# Patient Record
Sex: Male | Born: 1940 | Race: White | Hispanic: No | Marital: Married | Smoking: Former smoker
Health system: Southern US, Community
[De-identification: ages and names within clinical notes are randomized; demographics above are authoritative.]

## PROBLEM LIST (undated history)

## (undated) DIAGNOSIS — I714 Abdominal aortic aneurysm, without rupture, unspecified: Secondary | ICD-10-CM

## (undated) DIAGNOSIS — J449 Chronic obstructive pulmonary disease, unspecified: Secondary | ICD-10-CM

## (undated) DIAGNOSIS — R918 Other nonspecific abnormal finding of lung field: Secondary | ICD-10-CM

## (undated) DIAGNOSIS — I70209 Unspecified atherosclerosis of native arteries of extremities, unspecified extremity: Secondary | ICD-10-CM

## (undated) DIAGNOSIS — M1990 Unspecified osteoarthritis, unspecified site: Secondary | ICD-10-CM

## (undated) DIAGNOSIS — C679 Malignant neoplasm of bladder, unspecified: Secondary | ICD-10-CM

## (undated) DIAGNOSIS — C3490 Malignant neoplasm of unspecified part of unspecified bronchus or lung: Secondary | ICD-10-CM

## (undated) DIAGNOSIS — I10 Essential (primary) hypertension: Secondary | ICD-10-CM

## (undated) DIAGNOSIS — I6523 Occlusion and stenosis of bilateral carotid arteries: Secondary | ICD-10-CM

## (undated) DIAGNOSIS — I350 Nonrheumatic aortic (valve) stenosis: Secondary | ICD-10-CM

## (undated) DIAGNOSIS — E559 Vitamin D deficiency, unspecified: Secondary | ICD-10-CM

## (undated) DIAGNOSIS — E785 Hyperlipidemia, unspecified: Secondary | ICD-10-CM

## (undated) DIAGNOSIS — I4819 Other persistent atrial fibrillation: Secondary | ICD-10-CM

## (undated) DIAGNOSIS — E039 Hypothyroidism, unspecified: Secondary | ICD-10-CM

## (undated) DIAGNOSIS — C4490 Unspecified malignant neoplasm of skin, unspecified: Secondary | ICD-10-CM

## (undated) HISTORY — PX: BACK SURGERY: SHX140

## (undated) HISTORY — DX: Unspecified osteoarthritis, unspecified site: M19.90

## (undated) HISTORY — DX: Malignant neoplasm of bladder, unspecified: C67.9

## (undated) HISTORY — DX: Abdominal aortic aneurysm, without rupture: I71.4

## (undated) HISTORY — PX: SKIN CANCER EXCISION: SHX779

## (undated) HISTORY — DX: Abdominal aortic aneurysm, without rupture, unspecified: I71.40

## (undated) HISTORY — PX: HYDROCELE EXCISION / REPAIR: SUR1145

## (undated) HISTORY — DX: Occlusion and stenosis of bilateral carotid arteries: I65.23

## (undated) HISTORY — DX: Unspecified atherosclerosis of native arteries of extremities, unspecified extremity: I70.209

## (undated) HISTORY — DX: Vitamin D deficiency, unspecified: E55.9

## (undated) HISTORY — PX: SPINE SURGERY: SHX786

## (undated) HISTORY — DX: Unspecified malignant neoplasm of skin, unspecified: C44.90

## (undated) HISTORY — DX: Essential (primary) hypertension: I10

## (undated) HISTORY — DX: Chronic obstructive pulmonary disease, unspecified: J44.9

## (undated) HISTORY — DX: Hyperlipidemia, unspecified: E78.5

---

## 1997-10-10 ENCOUNTER — Inpatient Hospital Stay (HOSPITAL_COMMUNITY): Admission: RE | Admit: 1997-10-10 | Discharge: 1997-10-12 | Payer: Self-pay | Admitting: Neurosurgery

## 1999-08-21 ENCOUNTER — Encounter: Admission: RE | Admit: 1999-08-21 | Discharge: 1999-08-21 | Payer: Self-pay | Admitting: Neurosurgery

## 1999-08-21 ENCOUNTER — Encounter: Payer: Self-pay | Admitting: Neurosurgery

## 2004-12-19 ENCOUNTER — Ambulatory Visit: Payer: Self-pay | Admitting: Cardiology

## 2005-06-27 ENCOUNTER — Encounter: Admission: RE | Admit: 2005-06-27 | Discharge: 2005-06-27 | Payer: Self-pay | Admitting: Surgery

## 2007-10-15 ENCOUNTER — Ambulatory Visit: Payer: Self-pay | Admitting: Cardiology

## 2008-10-02 ENCOUNTER — Ambulatory Visit: Payer: Self-pay | Admitting: Surgery

## 2008-10-16 ENCOUNTER — Encounter: Admission: RE | Admit: 2008-10-16 | Discharge: 2008-10-16 | Payer: Self-pay | Admitting: Surgery

## 2008-10-16 ENCOUNTER — Ambulatory Visit: Payer: Self-pay | Admitting: Surgery

## 2009-03-26 ENCOUNTER — Ambulatory Visit: Payer: Self-pay | Admitting: Surgery

## 2009-04-09 ENCOUNTER — Encounter: Admission: RE | Admit: 2009-04-09 | Discharge: 2009-04-09 | Payer: Self-pay | Admitting: Surgery

## 2009-04-09 ENCOUNTER — Ambulatory Visit: Payer: Self-pay | Admitting: Surgery

## 2009-07-16 ENCOUNTER — Encounter: Admission: RE | Admit: 2009-07-16 | Discharge: 2009-07-16 | Payer: Self-pay | Admitting: Surgery

## 2009-07-16 ENCOUNTER — Ambulatory Visit: Payer: Self-pay | Admitting: Surgery

## 2009-08-09 ENCOUNTER — Ambulatory Visit: Payer: Self-pay | Admitting: Surgery

## 2009-08-09 ENCOUNTER — Inpatient Hospital Stay (HOSPITAL_COMMUNITY): Admission: RE | Admit: 2009-08-09 | Discharge: 2009-08-10 | Payer: Self-pay | Admitting: Surgery

## 2009-08-09 HISTORY — PX: ABDOMINAL AORTIC ANEURYSM REPAIR: SUR1152

## 2009-09-03 ENCOUNTER — Encounter: Admission: RE | Admit: 2009-09-03 | Discharge: 2009-09-03 | Payer: Self-pay | Admitting: Surgery

## 2009-09-03 ENCOUNTER — Ambulatory Visit: Payer: Self-pay | Admitting: Surgery

## 2009-12-17 ENCOUNTER — Ambulatory Visit: Payer: Self-pay | Admitting: Surgery

## 2010-06-03 ENCOUNTER — Ambulatory Visit: Payer: Self-pay | Admitting: Surgery

## 2010-09-12 ENCOUNTER — Other Ambulatory Visit: Payer: Self-pay | Admitting: Surgery

## 2010-09-12 DIAGNOSIS — I714 Abdominal aortic aneurysm, without rupture, unspecified: Secondary | ICD-10-CM

## 2010-09-12 LAB — COMPREHENSIVE METABOLIC PANEL
ALT: 60 U/L — ABNORMAL HIGH (ref 0–53)
ALT: 70 U/L — ABNORMAL HIGH (ref 0–53)
AST: 46 U/L — ABNORMAL HIGH (ref 0–37)
AST: 46 U/L — ABNORMAL HIGH (ref 0–37)
Albumin: 4.4 g/dL (ref 3.5–5.2)
Alkaline Phosphatase: 68 U/L (ref 39–117)
BUN: 14 mg/dL (ref 6–23)
CO2: 24 mEq/L (ref 19–32)
Calcium: 8.7 mg/dL (ref 8.4–10.5)
Chloride: 107 mEq/L (ref 96–112)
Creatinine, Ser: 0.85 mg/dL (ref 0.4–1.5)
GFR calc Af Amer: >60 mL/min (ref >60)
GFR calc non Af Amer: >60 mL/min (ref >60)
Potassium: 3.8 mEq/L (ref 3.5–5.1)
Potassium: 4.5 mEq/L (ref 3.5–5.1)
Sodium: 134 mEq/L — ABNORMAL LOW (ref 135–145)
Sodium: 139 mEq/L (ref 135–145)
Total Bilirubin: 0.9 mg/dL (ref 0.3–1.2)
Total Protein: 6.1 g/dL (ref 6.0–8.3)

## 2010-09-12 LAB — BLOOD GAS, ARTERIAL
Acid-base deficit: 0.1 mmol/L (ref 0.0–2.0)
Acid-base deficit: 0.4 mmol/L (ref 0.0–2.0)
Drawn by: 24485
O2 Content: 4 L/min
O2 Saturation: 95.4 %
Patient temperature: 98.6
Patient temperature: 98.6
TCO2: 25.9 mmol/L (ref 0–100)
pCO2 arterial: 40.1 mmHg (ref 35.0–45.0)
pH, Arterial: 7.396 (ref 7.350–7.450)

## 2010-09-12 LAB — BASIC METABOLIC PANEL
CO2: 25 mEq/L (ref 19–32)
Calcium: 8.7 mg/dL (ref 8.4–10.5)
Chloride: 106 mEq/L (ref 96–112)
GFR calc Af Amer: >60 mL/min (ref >60)
GFR calc non Af Amer: >60 mL/min (ref >60)
Glucose, Bld: 138 mg/dL — ABNORMAL HIGH (ref 70–99)

## 2010-09-12 LAB — CBC
HCT: 36.4 % — ABNORMAL LOW (ref 39.0–52.0)
Hemoglobin: 11.9 g/dL — ABNORMAL LOW (ref 13.0–17.0)
Hemoglobin: 15 g/dL (ref 13.0–17.0)
MCHC: 35 g/dL (ref 30.0–36.0)
MCHC: 35.1 g/dL (ref 30.0–36.0)
MCV: 92.7 fL (ref 78.0–100.0)
Platelets: 238 10*3/uL (ref 150–400)
RBC: 3.65 MIL/uL — ABNORMAL LOW (ref 4.22–5.81)
RDW: 14 % (ref 11.5–15.5)
WBC: 13.2 10*3/uL — ABNORMAL HIGH (ref 4.0–10.5)
WBC: 9.8 10*3/uL (ref 4.0–10.5)

## 2010-09-12 LAB — URINALYSIS, ROUTINE W REFLEX MICROSCOPIC
Hgb urine dipstick: NEGATIVE
Nitrite: NEGATIVE
Protein, ur: NEGATIVE mg/dL
Specific Gravity, Urine: 1.018 (ref 1.005–1.030)

## 2010-09-12 LAB — PROTIME-INR
INR: 1.19 (ref 0.00–1.49)
Prothrombin Time: 13.8 seconds (ref 11.6–15.2)
Prothrombin Time: 15 seconds (ref 11.6–15.2)

## 2010-09-12 LAB — TYPE AND SCREEN: Antibody Screen: NEGATIVE

## 2010-09-12 LAB — ABO/RH: ABO/RH(D): A POS

## 2010-09-30 ENCOUNTER — Ambulatory Visit
Admission: RE | Admit: 2010-09-30 | Discharge: 2010-09-30 | Disposition: A | Discharge status: Home / Self Care | Payer: Medicare Other | Source: Ambulatory Visit | Attending: Surgery | Admitting: Surgery

## 2010-09-30 ENCOUNTER — Ambulatory Visit (INDEPENDENT_AMBULATORY_CARE_PROVIDER_SITE_OTHER): Payer: Medicare Other | Admitting: Surgery

## 2010-09-30 DIAGNOSIS — I714 Abdominal aortic aneurysm, without rupture: Secondary | ICD-10-CM

## 2010-09-30 MED ORDER — IOHEXOL 350 MG/ML SOLN
100.0000 mL | Freq: Once | INTRAVENOUS | Status: AC | PRN
  Administered 2010-09-30: 100 mL via INTRAVENOUS

## 2010-10-01 NOTE — Assessment & Plan Note (Signed)
OFFICE VISIT  LEWIS, KEATS DOB:  02/16/1941                                       09/30/2010 EAVWU#:[id number]  REASON FOR VISIT:  Followup aneurysm.  HISTORY:  This is a 70 year old gentleman who is status post percutaneous endovascular aneurysm repair on 08/09/2009 using a Gore excluder device.  He had an uncomplicated postoperative course and comes back in today for followup.  He had a CT scan today.  He denies symptoms of abdominal pain.  He denies neurologic symptoms.  PHYSICAL EXAMINATION:  Vital signs:  Heart rate 71, blood pressure 134/74, respiratory rate 20.  General:  He is well-appearing, in no distress.  Abdomen:  Soft, nontender.  Palpable femoral pulses. Neurological:  He is intact.  DIAGNOSTIC STUDIES:  I reviewed his CT scan.  His aneurysm sac has decreased to 4.2 x 3.4, the previous CT measurements were 5.1 x 4.6.  ASSESSMENT AND PLAN: 1. Status post endovascular aneurysm repair.  His aneurysm is     shrinking nicely.  There was no evidence of endoleak on CT scan.     He will come back for an ultrasound in 6 months.  I believe we can     follow him from here on out with ultrasound. 2. Carotid disease.  The patient is scheduled for a carotid ultrasound     in June as a 1 year followup.  His most recent ultrasound showed     40%-59% stenosis on the right.  I am going to try to coordinate     this so that he gets his carotid and his abdominal ultrasound on     the same day which will be 6 months from now.    Jorge Ny, MD Electronically Signed  VWB/MEDQ  D:  09/30/2010  T:  10/01/2010  Job:  [id number]  cc:   Caralyn Guile. Ethelene Hal, M.D. Dr Lindaann Pascal

## 2010-11-05 NOTE — Procedures (Signed)
VASCULAR LAB EXAM   INDICATION:  Followup of AAA endograft placed 08/09/2009.   HISTORY:  Diabetes:  No.  Cardiac:  No.  Hypertension:  Yes.   EXAM:  AAA sac size 4.5 cm AP, 4.25 cm transverse.  Previous sac size 4.5 cm AP, 5.1 cm transverse.   IMPRESSION:  1. The aorta and endograft appear patent.  2. No significant change in size of the aneurysmal sac surrounding the      endograft.  3. No evidence of endo leak was detected.   ___________________________________________  V. Charlena Cross, MD   EM/MEDQ  D:  06/04/2010  T:  06/04/2010  Job:  [id number]

## 2010-11-05 NOTE — Assessment & Plan Note (Signed)
OFFICE VISIT   [name] [name], [name] B.  DOB:  1941/03/05                                       10/16/2008  [id number]#:[id number]   REASON FOR VISIT:  Followup.   HISTORY:  This is a 70 year old gentleman who initially presented with  back pain.  His workup revealed a large abdominal aneurysm.  He is sent  to me for further evaluation.  I had sent him for CT scan as well as  Doppler studies.  He comes back to review these studies.  He has had no  change in his symptoms.  He does not have any back pain.   PHYSICAL EXAMINATION:  VITAL SIGNS:  Blood pressure 148/78, pulse 76,  respirations 18.  GENERAL:  He is well-appearing in no distress.  ABDOMEN:  Soft, nontender.  EXTREMITIES:  Warm and well perfused.   DIAGNOSTIC STUDIES:  CT angiogram reveals a 4.6 cm maximal diameter  infrarenal abdominal aortic aneurysm with a 26 mm infrarenal aortic  neck.   Lower extremity Doppler evaluations reveal ankle brachial index of 1.0  on the right and 0.95 on the left.   Carotid ultrasound reveals 40-59% right carotid stenosis and 20-39% left  carotid stenosis.   ASSESSMENT:  Abdominal aortic aneurysm.   PLAN:  The patient's infrarenal abdominal aortic aneurysm is not yet of  the size that I would recommend repair.  We will need to follow this on  a biannual  basis.  I will plan on seeing him back in 6 months with an  abdominal ultrasound.  We will base our decision to repair this on its  size as well as the rate it is growing.  I told the patient that he has  very low risk for rupture.  However, should he have severe abdominal  pain, he would need to go to the Emergency Department to have this  evaluated.   Jorge Ny, MD  Electronically Signed   VWB/MEDQ  D:  10/16/2008  T:  10/17/2008  Job:  [id number]   cc:   Caralyn Guile. Ethelene Hal, M.D.  Scott Dr. Jacqulyn Bath

## 2010-11-05 NOTE — Assessment & Plan Note (Signed)
OFFICE VISIT   SHAWNEE, HIGHAM  DOB:  Apr 10, 1941                                       04/09/2009  EAVWU#:[id number]   REASON FOR VISIT:  Abdominal aortic aneurysm.   HISTORY:  This is a 70 year old gentleman, who is being followed for  asymptomatic abdominal aortic aneurysm.  He is most recently seen in the  office and found to have increase in size of his aneurysm by ultrasound.  He was sent for CT scan and comes back today for evaluation.   The patient denies having any abdominal pain.  His hypertension,  hypercholesterolemia, both well controlled with medications.   He denies chest pain or shortness of breath.   PHYSICAL EXAMINATION:  Blood pressure 151/77, pulse is 79.  He is well-  appearing in no distress.  Cardiovascular:  Regular rate and rhythm.  Normocephalic, atraumatic.  Respirations nonlabored.  Abdomen is soft,  nontender.  Extremities are warm and well perfused.   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   ASSESSMENT:  Infrarenal abdominal aortic aneurysm, asymptomatic.   PLAN:  The patient does not yet meet size criteria for repair.  I am  going to have him come back to see me in 3 months.  At that time he will  get a CT angiogram.  If he has increased to greater than 5 cm we would  discuss proceeding with repair.  He mentioned that he is interested in  open operation, however, he does not know much about the endovascular  repair.  Therefore we will discuss both methods when he comes back.   Jorge Ny, MD  Electronically Signed   VWB/MEDQ  D:  04/09/2009  T:  04/10/2009  Job:  [id number]   cc:   Dr. Rennis Harding D. Ethelene Hal, M.D.

## 2010-11-05 NOTE — Assessment & Plan Note (Signed)
OFFICE VISIT   DASHTON, CZERWINSKI  DOB:  1941/05/19                                       07/16/2009  LOVFI#:[id number]   REASON FOR VISIT:  Abdominal aneurysm.   HISTORY:  This is a 68-year gentleman that I have been following for  abdominal aortic aneurysm who comes back in today for review of his most  recent CT scan.  He denies having any abdominal pain.  The patient  suffers from hypertension and hypercholesterolemia, both which are  medically managed.   PAST MEDICAL HISTORY:  Hypertension and hypercholesterolemia.   PAST SURGICAL HISTORY:  Back surgery x2.   FAMILY HISTORY:  Positive for early atherosclerotic disease, diabetes,  hypertension and malignancy.   SOCIAL HISTORY:  He has a history of tobacco but quit in 2007.  Does not  drink.   PHYSICAL EXAMINATION:  Heart rate 83, blood pressure 147/80,  respirations 16.  General:  Well-appearing, in no distress.  HEENT is  normal.  LUNGS:  Clear bilaterally.  Cardiovascular:  Regular rate and  rhythm.  Abdomen:  Soft, nontender.  No hepatosplenomegaly.  Musculoskeletal:  Without major deformities.  Neurology:  Without focal  deficits.  Skin is without rash.   DIAGNOSTIC STUDIES:  CT angiogram was done today.  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   Most recent carotid ultrasound was in June of 2009.  This revealed 40%  to  59% right stenosis, 20% to 39% left.  Ankle brachial index is 1.0 on  the right and 0.95 in the posterior tibial artery on the left.   ASSESSMENT/PLAN:  Infrarenal abdominal aortic aneurysm.   Plan - I  discussed continued observation versus endovascular treatment.  We have decided to proceed with endovascular repair.  We discussed the  significance of the dissection in the left common iliac artery.  I do  not think this will be a problem.  He is scheduled to undergo repair on  Thursday,  February 17.  I am sitting in for cardiac Myoview for  preoperative clearance.  Risks and benefits were discussed with the  patient.     Jorge Ny, MD  Electronically Signed   VWB/MEDQ  D:  07/16/2009  T:  07/17/2009  Job:  [id number]   cc:   Caralyn Guile. Ethelene Hal, M.D.  Scott Dr. Jacqulyn Bath

## 2010-11-05 NOTE — Procedures (Signed)
CAROTID DUPLEX EXAM   INDICATION:  Known carotid disease.   HISTORY:  Diabetes:  No.  Cardiac:  No.  Hypertension:  Yes.  Smoking:  Quit in 2003.  Previous Surgery:  AAA repair on 08/09/09, asymptomatic.  CV History:  Amaurosis Fugax No, Paresthesias No, Hemiparesis No.                                       RIGHT             LEFT  Brachial systolic pressure:         141               148  Brachial Doppler waveforms:         Normal            Normal  Vertebral direction of flow:        Antegrade         Antegrade  DUPLEX VELOCITIES (cm/sec)  CCA peak systolic                   75                79  ECA peak systolic                   87                99  ICA peak systolic                   119               93  ICA end diastolic                   35                38  PLAQUE MORPHOLOGY:                  Heterogenous      Heterogenous  PLAQUE AMOUNT:                      Moderate          Mild  PLAQUE LOCATION:                    Bifurcation/ICA   Bifurcation/ICA   IMPRESSION:  1. The Doppler velocities suggest 40% to 59% stenosis in the right      internal carotid artery.  2. Doppler velocities suggest 1% to 39% stenosis in the left internal      carotid artery.  3. Antegrade flow in the bilateral vertebral arteries.   ___________________________________________  V. Charlena Cross, MD   NT/MEDQ  D:  12/17/2009  T:  12/17/2009  Job:  [id number]

## 2010-11-05 NOTE — Assessment & Plan Note (Signed)
OFFICE VISIT   [name], [name]  DOB:  [birth year]                                       09/03/2009  ZOXWR#:[id number]   REASON FOR VISIT:  Follow up aneurysm.   Patient comes back in today.  He is status post endovascular aneurysm  repair on 08/09/2009.  This was done with a Gore excluder device using a  percutaneous approach.  Again, his postoperative course is  uncomplicated.  He comes back today feeling and saying he has had no  trouble whatsoever at home.   He has a CT scan today which shows no evidence of endoleak, as the graft  is in good position.   On examination, his abdomen is soft.  He has palpable pedal pulses.  His  incisions in his groin are stable.   The patient will come back to see me in 3 months with an abdominal  ultrasound.  At that time, I will also obtain his 1-year follow-up  carotid surveillance duplex.     Jorge Ny, MD  Electronically Signed   VWB/MEDQ  D:  09/03/2009  T:  09/04/2009  Job:  [id number]   cc:   Caralyn Guile. Ethelene Hal, M.D.  Dr. Lindaann Pascal

## 2010-11-05 NOTE — Procedures (Signed)
ENDOVASCULAR STENT GRAFT EXAM   INDICATION:  Follow up AAA repair.   HISTORY:  08/09/2009, AAA stent graft repair.                           DUPLEX EVALUATION   AAA Sac Size:                 4.5 CM AP             5.1 CM TRV  Previous Sac Size:            4.6 by CT on 09/03/09 CM AP  5.1 by CT on 09/03/09 CM TRV  Evidence of an endoleak?      No                    No   Velocity Criteria:  Proximal Aorta                75 cm/sec  Proximal Stent Graft          70 cm/sec  Main Body Stent Graft-Mid     49 cm/sec  Right Limb-Proximal           46 cm/sec  Right Limb-Distal             131 cm/sec  Left Limb-Proximal            55 cm/sec  Left Limb-Distal              97 cm/sec  Patent Renal Arteries?        Yes                   Yes   IMPRESSION:  Patent abdominal aortic aneurysm stent graft repair with no  evidence of an endoleak.  Stable sac size from previous CT on 09/03/09.   ___________________________________________  V. Charlena Cross, MD   NT/MEDQ  D:  12/17/2009  T:  12/17/2009  Job:  [id number]

## 2010-11-05 NOTE — Assessment & Plan Note (Signed)
OFFICE VISIT   [name] [name], [name] [name]  DOB:  24-Dec-1940                                       06/03/2010  UEAVW#:[id number]   The patient comes back today for followup of his aneurysm repair which  was done on 08/09/2009.  This was done percutaneously using a [employer] device.  He had an uncomplicated postoperative course.  He  comes in today for ultrasound.  He has no complaints at this time.   PHYSICAL EXAMINATION:  Vital signs:  His heart rate is 70, blood  pressure 145/76, O2 sats are 99%.  General:  He is well-appearing, in no  distress.  Respirations:  Nonlabored.  Cardiovascular:  He has palpable  posterior tibial pulses bilaterally.  Abdomen:  Soft, nontender.  No  pulsatile mass.  Groin incisions are well-healed.   DIAGNOSTIC STUDIES:  Abdominal ultrasound reveals slight decrease in the  size of his aneurysm sac.  It now measures 4.5 x 4.25 and it previously  measured 4.5 x 5.1.   ASSESSMENT:  1. Abdominal aneurysm:  The patient is doing well.  His aneurysm sac      is shrinking.  He has had no complications from his repair.  I will      plan on getting a 1 year CT scan of his abdomen and pelvis in 3      months with followup.  2. Carotid duplex:  The patient's last carotid ultrasound showed 40%-      59% stenosis on the right.  He should be scheduled to have his 1      year followup in June.     Jorge Ny, MD  Electronically Signed   VWB/MEDQ  D:  06/03/2010  T:  06/03/2010  Job:  [id number]   cc:   Dr Rennis Harding D. Ethelene Hal, M.D.

## 2010-11-05 NOTE — Procedures (Signed)
CAROTID DUPLEX EXAM   INDICATION:  Carotid artery disease.   HISTORY:  Diabetes:  Yes.  Cardiac:  No.  Hypertension:  Yes.  Smoking:  Previous.  Previous Surgery:  No.  CV History:  No.  Amaurosis Fugax No, Paresthesias No, Hemiparesis No.                                       RIGHT             LEFT  Brachial systolic pressure:         173               170  Brachial Doppler waveforms:         Within normal limits                Within normal limits  Vertebral direction of flow:        Antegrade         Antegrade  DUPLEX VELOCITIES (cm/sec)  CCA peak systolic                   110               113  ECA peak systolic                   88                102  ICA peak systolic                   117               92  ICA end diastolic                   40                35  PLAQUE MORPHOLOGY:                  Heterogeneous     Heterogeneous  PLAQUE AMOUNT:                      Moderate          Mild  PLAQUE LOCATION:                    BIF, ICA          BIF, ICA   IMPRESSION:  1. A 40% to 59% right internal carotid artery stenosis.  2. A 20% to 39% left internal carotid artery stenosis.   ___________________________________________  V. Charlena Cross, MD   AC/MEDQ  D:  10/16/2008  T:  10/16/2008  Job:  [phone] [phone]

## 2010-11-05 NOTE — Assessment & Plan Note (Signed)
OFFICE VISIT   [name] [name], [name] [name]  DOB:  19-Feb-1941                                       03/26/2009  WUJWJ#:[id number]   REASON FOR VISIT:  Follow-up aneurysm.   HISTORY:  This is a 70 year old gentleman with asymptomatic abdominal  aortic aneurysm.  CT scan in April revealed 4.6 cm aneurysm. He comes  back in today for serial follow-up.  He denies having any abdominal  pain. Since his last visit he has had some ongoing back pain which has  been alleviated with injections.  He has not had any chest pain or  claudication.   PHYSICAL EXAMINATION:  Vital signs:  Blood pressure is 176/81, pulse 89.  General:  He is well-appearing, no distress.  Cardiovascular:  Regular  rhythm, respirations nonlabored.  Abdomen:  Obese, soft, nontender.  Pulsatile masses appreciated today.  Extremities:  Unable to palpated right dorsalis pedis. The left leg is  warm, well-perfused, I cannot palpate pedal pulses.   DIAGNOSTIC STUDIES:  Duplex ultrasound performed today which reveals an  increase in his aneurysm size to 5.1 x 5.5 cm.   ASSESSMENT/PLAN:  Asymptomatic abdominal aortic aneurysm.   PLAN:  The patient is scheduled for CT angiogram and will return after  that. Based on findings of his CAT scan we will make recommendations for  open versus endovascular repair.  Assuming that the size the aneurysm  correlates with the ultrasound.  This will be done within the next 1-2  weeks.   Jorge Ny, MD  Electronically Signed   VWB/MEDQ  D:  03/26/2009  T:  03/27/2009  Job:  [id number]   cc:   Caralyn Guile. Ethelene Hal, M.D.  Dr. Lindaann Pascal

## 2010-11-05 NOTE — Assessment & Plan Note (Signed)
OFFICE VISIT   GARRITT, MOLYNEUX  DOB:  Apr 18, 1941                                       10/02/2008  EAVWU#:[id number]   REASON FOR VISIT:  Abdominal aneurysm.   REFERRING PHYSICIAN:  Richard D. Ethelene Hal, M.D.   PRIMARY PHYSICIAN:  Dr. Lindaann Pascal.   HISTORY:  This is a 70 year old gentleman, who began having back pain in  February 2010 while he was rotating his wife's tires.  He has undergone  workup for this and most recently had an MRI which revealed a 4.7 x 4.6  abdominal aortic aneurysm.  He is sent to me for further evaluation.  The patient denies having any abdominal pain.  He has been completely  asymptomatic.   The patient has a significant atherosclerotic family history with  multiple people dying of heart attacks in their 53s.  The patient has  never had an MI.  He has never had chest pain.  He does complain of some  cramping in his feet.  However this does not sound like claudication.   PAST MEDICAL HISTORY:  Significant for hypertension and  hypercholesterolemia.   PAST SURGICAL HISTORY:  Back surgery x2.   FAMILY HISTORY:  Is positive for early atherosclerotic disease,  diabetes, hypertension and malignancy.   SOCIAL HISTORY:  He is married.  He is retired.  History of smoking but  quit in 2007.  Does not drink.   REVIEW OF SYSTEMS:  With exception of back pain is negative on all  accounts.   MEDICATIONS:  1. Lisinopril 40 mg once a day.  2. Amlodipine 5 mg once a day.  3. Lipitor 40 mg once a day.  4. Levothyroxine 50 mcg once a day.  5. Bayer aspirin 325 mg once a day.  6. __________ once a day.   ALLERGIES:  None.   PHYSICAL EXAMINATION:  Vital signs:  Blood pressure 139/86, pulse 81.  General:  Well-appearing in no distress.  HEENT:  Normocephalic, atraumatic.  Pupils equal.  Sclerae anicteric.  Neck is supple.  No JVD.  Positive carotid bruit.  Cardiovascular:  Regular rhythm.  Positive murmur.  Pulmonary:  Lungs are  clear bilaterally.  Abdomen:  Soft, nontender.  No hepatosplenomegaly.  Extremities:  Palpable femoral pulses.  I cannot palpate pedal pulses.  There are no ulcerations.  Neuro:  He is nonfocal.  Psychiatric:  He is alert and x3.  Skin:  Without rash.   DIAGNOSTIC STUDIES:  Ultrasound was performed today which reveals a 5.0  x 4.9 cm abdominal aortic aneurysm.   ASSESSMENT/PLAN:  Abdominal aortic aneurysm.  Plan; I think my next step  is to obtain a CT angiogram, which I feel is best test to fully evaluate  his aneurysm and to determine whether or not he is a candidate for  endovascular repair.  I an going to have this done within the next  several weeks.  Since I cannot palpate pedal pulses we will also get a  lower extremity ultrasound and with his carotid bruits, we will need  carotid duplex.  The patient will come back to see me in 1-2 weeks and  we will decide at that point what our next step is.  This will all  depend on what the measurements of his aneurysm is by CT scan.   Jorge Ny, MD  Electronically Signed   VWB/MEDQ  D:  10/02/2008  T:  10/03/2008  Job:  [id number]   cc:   Caralyn Guile. Ethelene Hal, M.D.  Dr. Lindaann Pascal

## 2010-11-05 NOTE — Procedures (Signed)
DUPLEX ULTRASOUND OF ABDOMINAL AORTA   INDICATION:  Follow up abdominal aortic aneurysm.   HISTORY:  Diabetes:  No.  Cardiac:  No.  Hypertension:  Yes.  Smoking:  Quit in 2007.  Connective Tissue Disorder:  Family History:  No.  Previous Surgery:  No.   DUPLEX EXAM:         AP (cm)                   TRANSVERSE (cm)  Proximal             1.79 cm                   1.97 cm  Mid                  5.02 cm                   4.92 cm  Distal               2.31 cm                   3.07 cm  Right Iliac          1.13 cm                   1.02 cm  Left Iliac           1.01 cm                   1.16 cm   PREVIOUS:  Date:  AP:  TRANSVERSE:   IMPRESSION:  Sonographic evidence of abdominal aortic aneurysm with the  largest measurement of 5.02 X 4.92 cm.   ___________________________________________  V. Charlena Cross, MD   AC/MEDQ  D:  10/02/2008  T:  10/02/2008  Job:  [id number]

## 2010-11-05 NOTE — Procedures (Signed)
DUPLEX ULTRASOUND OF ABDOMINAL AORTA   INDICATION:  Follow up aorta abdominal aneurysm.   HISTORY:  Diabetes:  No.  Cardiac:  No.  Hypertension:  Yes.  Smoking:  Previous, quit in 2003.  Connective Tissue Disorder:  Family History:  None  Previous Surgery:  Back surgery.   DUPLEX EXAM:         AP (cm)                   TRANSVERSE (cm)  Proximal             2.2 cm                    2.2 cm  Mid                  5.1 cm                    5.5 cm  Distal               2.1 cm                    2.7 cm  Right Iliac          0.9 cm                    1.0 cm  Left Iliac           0.9 cm                    1.1 cm   PREVIOUS:  Date:  AP:  5.02  TRANSVERSE:  4.92   IMPRESSION:  Abdominal aortic aneurysm noted with the largest  measurement of 5.1 x 5.5 cm.   ___________________________________________  V. Charlena Cross, MD   CB/MEDQ  D:  03/26/2009  T:  03/27/2009  Job:  [id number]

## 2010-12-26 ENCOUNTER — Other Ambulatory Visit: Payer: Self-pay

## 2011-02-19 ENCOUNTER — Encounter: Payer: Self-pay | Admitting: Surgery

## 2011-03-31 ENCOUNTER — Other Ambulatory Visit: Payer: Self-pay

## 2011-03-31 ENCOUNTER — Ambulatory Visit: Payer: Medicare Other | Admitting: Surgery

## 2011-04-07 ENCOUNTER — Encounter: Payer: Self-pay | Admitting: Surgery

## 2011-04-16 ENCOUNTER — Other Ambulatory Visit: Payer: Self-pay

## 2011-04-16 ENCOUNTER — Other Ambulatory Visit: Payer: Medicare Other

## 2011-04-28 ENCOUNTER — Ambulatory Visit: Payer: Medicare Other | Admitting: Surgery

## 2011-05-23 ENCOUNTER — Encounter: Payer: Self-pay | Admitting: Surgery

## 2011-05-26 ENCOUNTER — Other Ambulatory Visit: Payer: Medicare Other

## 2011-05-26 ENCOUNTER — Ambulatory Visit (INDEPENDENT_AMBULATORY_CARE_PROVIDER_SITE_OTHER): Payer: Medicare Other | Admitting: *Deleted

## 2011-05-26 ENCOUNTER — Encounter: Payer: Self-pay | Admitting: Surgery

## 2011-05-26 ENCOUNTER — Ambulatory Visit: Payer: Medicare Other | Admitting: Surgery

## 2011-05-26 ENCOUNTER — Other Ambulatory Visit (INDEPENDENT_AMBULATORY_CARE_PROVIDER_SITE_OTHER): Payer: Medicare Other | Admitting: *Deleted

## 2011-05-26 ENCOUNTER — Other Ambulatory Visit: Payer: Self-pay

## 2011-05-26 ENCOUNTER — Ambulatory Visit (INDEPENDENT_AMBULATORY_CARE_PROVIDER_SITE_OTHER): Payer: Medicare Other | Admitting: Surgery

## 2011-05-26 VITALS — BP 143/81 | HR 71 | Resp 16 | Ht 71.0 in | Wt 188.0 lb

## 2011-05-26 DIAGNOSIS — I714 Abdominal aortic aneurysm, without rupture, unspecified: Secondary | ICD-10-CM

## 2011-05-26 DIAGNOSIS — I6529 Occlusion and stenosis of unspecified carotid artery: Secondary | ICD-10-CM

## 2011-05-26 DIAGNOSIS — Z48812 Encounter for surgical aftercare following surgery on the circulatory system: Secondary | ICD-10-CM

## 2011-05-26 NOTE — Progress Notes (Signed)
[facility] [place]   Patient name: [name] [name] MRN: [id number] DOB: [birth year] Sex: male     Chief Complaint  Patient presents with  . Carotid    f/up 1 year    HISTORY OF PRESENT ILLNESS: The patient is back today for followup. 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 This was done percutaneously. His postoperative course was uncomplicated. His initial CT scan shows a decreasing size aneurysm with no evidence of endoleak. He denies abdominal pain currently. 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  Past Medical History  Diagnosis Date  . Arthritis   . Hyperlipidemia   . Hypertension   . Joint pain   . Weight gain   . AAA (abdominal aortic aneurysm)     Past Surgical History  Procedure Date  . Abdominal aortic aneurysm repair   . Back surgery     History   Social History  . Marital Status: Married    Spouse Name: N/A    Number of Children: N/A  . Years of Education: N/A   Occupational History  . Not on file.   Social History Main Topics  . Smoking status: Former Smoker -- 1.0 packs/day    Types: Cigarettes    Quit date: 10/27/2005  . Smokeless tobacco: Not on file  . Alcohol Use: No  . Drug Use: No  . Sexually Active:    Other Topics Concern  . Not on file   Social History Narrative  . No narrative on file    Family History  Problem Relation Age of Onset  . Heart disease Father     Allergies as of 05/26/2011  . (No Known Allergies)    Current Outpatient Prescriptions on File Prior to Visit  Medication Sig Dispense Refill  . amLODipine (NORVASC) 5 MG tablet Take 5 mg by mouth daily.        Marland Kitchen aspirin 325 MG EC tablet Take 325 mg by mouth daily.        Marland Kitchen atorvastatin (LIPITOR) 40 MG tablet Take 40 mg by mouth daily.        . cholecalciferol (VITAMIN D) 1000 UNITS tablet Take 1,000 Units by mouth daily.        Marland Kitchen levothyroxine  (SYNTHROID, LEVOTHROID) 75 MCG tablet Take 75 mcg by mouth daily.        Marland Kitchen lisinopril (PRINIVIL,ZESTRIL) 40 MG tablet Take 40 mg by mouth daily.        . Multiple Vitamin (ONE-A-DAY MENS PO) Take 1 tablet by mouth daily.        . niacin (NIASPAN) 1000 MG CR tablet Take 1,500 mg by mouth at bedtime.          REVIEW OF SYSTEMS: No change from prior visit  PHYSICAL EXAMINATION:   Vital signs are BP 143/81  Pulse 71  Resp 16  Ht 5\' 11"  (1.803 m)  Wt 188 lb (85.276 kg)  BMI 26.22 kg/m2  SpO2 97% General: The patient appears their stated age. HEENT:  No gross abnormalities Pulmonary:  Non labored breathing Abdomen: Soft and non-tender Musculoskeletal: There are no major deformities. Neurologic: No focal weakness or paresthesias are detected, Skin: There are no ulcer or rashes noted. Psychiatric: The patient has normal affect. Cardiovascular: Palpable posterior tibial pulse  Diagnostic Studies Abdominal ultrasound was ordered and reviewed today. This was limited visualization due to bowel gas however his aneurysm continues to decrease in  size. It measures 4.1 x 4.0 today Carotid ultrasound was ordered and reviewed this shows 1-39% bilateral stenosis  Assessment: Status post endovascular aneurysm repair Carotid stenosis, asymptomatic Plan: Overall the patient is doing very well. L. plan on seeing him back in one year with a repeat abdominal ultrasound to follow his aneurysm, as well as bilateral carotid duplex.  Jorge Ny, M.D. [facility] [place] Office: [phone] Pager:  [phone]

## 2011-06-09 NOTE — Procedures (Unsigned)
VASCULAR LAB EXAM  INDICATION:  Followup AAA endograft placed 08/09/2009  HISTORY: Diabetes:  No Cardiac:  No Hypertension:  Yes  EXAM:  AAA sac size 4.13 cm AP, 4.06 cm transverse Previous sac size 06/03/2010 4.5 cm AP, 4.25 cm transverse  IMPRESSION: 1. The aorta and endograft appear patent where visualized. 2. No significant change in size of the aneurysmal sac surrounding the     endograft. 3. No evidence of endoleak was detected. 4. Limited visualization due to overlying bowel gas.  ___________________________________________ V. Charlena Cross, MD  EM/MEDQ  D:  05/26/2011  T:  05/26/2011  Job:  [id number]

## 2011-06-09 NOTE — Procedures (Unsigned)
CAROTID DUPLEX EXAM  INDICATION:  Known carotid artery disease.  HISTORY: Diabetes:  No Cardiac:  No Hypertension:  Yes Smoking:  Quit 2003 Previous Surgery:  AAA repair 08/09/2009 CV History:  Asymptomatic Amaurosis Fugax No, Paresthesias No, Hemiparesis No                                      RIGHT             LEFT Brachial systolic pressure:         130               132 Brachial Doppler waveforms:         Normal            Normal Vertebral direction of flow:        Antegrade         Antegrade DUPLEX VELOCITIES (cm/sec) CCA peak systolic                   68                96 ECA peak systolic                   78                89 ICA peak systolic                   107               61 ICA end diastolic                   32                27 PLAQUE MORPHOLOGY:                  Mixed             Mixed PLAQUE AMOUNT:                      Mild              Mild PLAQUE LOCATION:                    Bifurcation, ICA  Bifurcation, ICA  IMPRESSION: 1. Bilateral internal carotid artery velocities suggest 1%-39%     stenosis. 2. Antegrade vertebral arteries bilaterally. 3. Stable in comparison to the last exam.  ___________________________________________ V. Charlena Cross, MD  EM/MEDQ  D:  05/26/2011  T:  05/26/2011  Job:  [id number]

## 2012-05-28 ENCOUNTER — Other Ambulatory Visit: Payer: Self-pay | Admitting: *Deleted

## 2012-05-28 ENCOUNTER — Encounter: Payer: Self-pay | Admitting: Neurosurgery

## 2012-05-28 DIAGNOSIS — Z48812 Encounter for surgical aftercare following surgery on the circulatory system: Secondary | ICD-10-CM

## 2012-05-28 DIAGNOSIS — I6529 Occlusion and stenosis of unspecified carotid artery: Secondary | ICD-10-CM

## 2012-05-28 DIAGNOSIS — I714 Abdominal aortic aneurysm, without rupture: Secondary | ICD-10-CM

## 2012-05-31 ENCOUNTER — Other Ambulatory Visit (INDEPENDENT_AMBULATORY_CARE_PROVIDER_SITE_OTHER): Payer: Medicare Other | Admitting: *Deleted

## 2012-05-31 ENCOUNTER — Encounter: Payer: Self-pay | Admitting: Neurosurgery

## 2012-05-31 ENCOUNTER — Encounter (INDEPENDENT_AMBULATORY_CARE_PROVIDER_SITE_OTHER): Payer: Medicare Other | Admitting: *Deleted

## 2012-05-31 ENCOUNTER — Ambulatory Visit (INDEPENDENT_AMBULATORY_CARE_PROVIDER_SITE_OTHER): Payer: Medicare Other | Admitting: Neurosurgery

## 2012-05-31 VITALS — BP 149/82 | HR 69 | Resp 16 | Ht 71.0 in | Wt 188.2 lb

## 2012-05-31 DIAGNOSIS — I714 Abdominal aortic aneurysm, without rupture: Secondary | ICD-10-CM

## 2012-05-31 DIAGNOSIS — Z48812 Encounter for surgical aftercare following surgery on the circulatory system: Secondary | ICD-10-CM

## 2012-05-31 DIAGNOSIS — I6529 Occlusion and stenosis of unspecified carotid artery: Secondary | ICD-10-CM

## 2012-05-31 DIAGNOSIS — Z8679 Personal history of other diseases of the circulatory system: Secondary | ICD-10-CM | POA: Insufficient documentation

## 2012-05-31 DIAGNOSIS — Z9889 Other specified postprocedural states: Secondary | ICD-10-CM

## 2012-05-31 NOTE — Progress Notes (Signed)
[facility] [place] AAA/Carotid Office Note  CC: AAA and carotid surveillance Referring Physician: Brabham  History of Present Illness: 71 year old male patient of Dr. Myra Gianotti status post aortic stent graft repair in February 2011. The patient denies any unusual abdominal or back pain. The patient denies any signs or symptoms of CVA, TIA, amaurosis fugax or any neural deficit.  Past Medical History  Diagnosis Date  . Arthritis   . Hyperlipidemia   . Hypertension   . Joint pain   . Weight gain   . AAA (abdominal aortic aneurysm)     ROS: [x]  Positive   [ ]  Denies    General: [ ]  Weight loss, [ ]  Fever, [ ]  chills Neurologic: [ ]  Dizziness, [ ]  Blackouts, [ ]  Seizure [ ]  Stroke, [ ]  "Mini stroke", [ ]  Slurred speech, [ ]  Temporary blindness; [ ]  weakness in arms or legs, [ ]  Hoarseness Cardiac: [ ]  Chest pain/pressure, [ ]  Shortness of breath at rest [ ]  Shortness of breath with exertion, [ ]  Atrial fibrillation or irregular heartbeat Vascular: [ ]  Pain in legs with walking, [ ]  Pain in legs at rest, [ ]  Pain in legs at night,  [ ]  Non-healing ulcer, [ ]  Blood clot in vein/DVT,   Pulmonary: [ ]  Home oxygen, [ ]  Productive cough, [ ]  Coughing up blood, [ ]  Asthma,  [ ]  Wheezing Musculoskeletal:  [ ]  Arthritis, [ ]  Low back pain, [ ]  Joint pain Hematologic: [ ]  Easy Bruising, [ ]  Anemia; [ ]  Hepatitis Gastrointestinal: [ ]  Blood in stool, [ ]  Gastroesophageal Reflux/heartburn, [ ]  Trouble swallowing Urinary: [ ]  chronic Kidney disease, [ ]  on HD - [ ]  MWF or [ ]  TTHS, [ ]  Burning with urination, [ ]  Difficulty urinating Skin: [ ]  Rashes, [ ]  Wounds Psychological: [ ]  Anxiety, [ ]  Depression   Social History History  Substance Use Topics  . Smoking status: Former Smoker -- 1.0 packs/day    Types: Cigarettes    Quit date: 10/27/2005  . Smokeless tobacco: Not on file  . Alcohol Use: No    Family History Family History  Problem Relation Age of Onset  .  Heart disease Father     No Known Allergies  Current Outpatient Prescriptions  Medication Sig Dispense Refill  . amLODipine (NORVASC) 5 MG tablet Take 5 mg by mouth daily.        Marland Kitchen aspirin 325 MG EC tablet Take 325 mg by mouth daily.        Marland Kitchen atorvastatin (LIPITOR) 40 MG tablet Take 40 mg by mouth daily.        . cholecalciferol (VITAMIN D) 1000 UNITS tablet Take 1,000 Units by mouth daily.        Marland Kitchen levothyroxine (SYNTHROID, LEVOTHROID) 75 MCG tablet Take 75 mcg by mouth daily.        Marland Kitchen lisinopril (PRINIVIL,ZESTRIL) 40 MG tablet Take 40 mg by mouth daily.        . Multiple Vitamin (ONE-A-DAY MENS PO) Take 1 tablet by mouth daily.        . niacin (NIASPAN) 1000 MG CR tablet Take 1,500 mg by mouth at bedtime.         Physical Examination  Filed Vitals:   05/31/12 0950  BP: 149/82  Pulse: 69  Resp:     Body mass index is 26.25 kg/(m^2).  General:  WDWN in NAD Gait: Normal HEENT: WNL Eyes: Pupils equal Pulmonary: normal non-labored breathing ,  without Rales, rhonchi,  wheezing Cardiac: RRR, without  Murmurs, rubs or gallops; Abdomen: soft, NT, no masses Skin: no rashes, ulcers noted  Vascular Exam Pulses: 2+ radial pulses bilaterally, palpable lower stream the pulses bilaterally, there is no abdominal mass palpated Carotid bruits: Carotid pulses to auscultation no bruits are heard Extremities without ischemic changes, no Gangrene , no cellulitis; no open wounds;  Musculoskeletal: no muscle wasting or atrophy   Neurologic: A&O X 3; Appropriate Affect ; SENSATION: normal; MOTOR FUNCTION:  moving all extremities equally. Speech is fluent/normal  Non-Invasive Vascular Imaging CAROTID DUPLEX 05/31/2012  Right ICA 40 - 59 % stenosis Left ICA 20 - 39 % stenosis AAA duplex did not show diameters of the residual sac due to overlying bowel gas. The graft appears patent.  ASSESSMENT/PLAN: Asymptomatic patient status post AAA stent graft repair, he will followup in one year with  repeat AAA duplex and he was encouraged to not eat 12 hours prior to the exam, he'll also have a repeat carotid duplex at that time. The patient's questions were encouraged and answered, he is in agreement with this plan. Next  Lauree Chandler ANP   Clinic MD: Myra Gianotti

## 2012-05-31 NOTE — Addendum Note (Signed)
Addended by: Sharee Pimple on: 05/31/2012 10:26 AM   Modules accepted: Orders

## 2012-09-14 ENCOUNTER — Other Ambulatory Visit: Payer: Self-pay | Admitting: Physician Assistant

## 2012-10-12 ENCOUNTER — Other Ambulatory Visit: Payer: Self-pay | Admitting: Physician Assistant

## 2012-10-13 NOTE — Telephone Encounter (Signed)
LAST OV 10/13 

## 2012-10-28 ENCOUNTER — Other Ambulatory Visit: Payer: Self-pay | Admitting: Physician Assistant

## 2012-10-28 NOTE — Telephone Encounter (Signed)
LAST LABS 9/13 

## 2012-11-12 ENCOUNTER — Other Ambulatory Visit: Payer: Self-pay | Admitting: Physician Assistant

## 2012-12-12 ENCOUNTER — Other Ambulatory Visit: Payer: Self-pay | Admitting: Nurse Practitioner

## 2012-12-12 ENCOUNTER — Other Ambulatory Visit: Payer: Self-pay | Admitting: Family Medicine

## 2012-12-14 MED ORDER — LISINOPRIL 40 MG PO TABS: 40.0000 mg | ORAL_TABLET | Freq: Every day | ORAL | Status: DC

## 2012-12-26 ENCOUNTER — Other Ambulatory Visit: Payer: Self-pay | Admitting: Family Medicine

## 2013-01-14 ENCOUNTER — Ambulatory Visit (INDEPENDENT_AMBULATORY_CARE_PROVIDER_SITE_OTHER): Payer: Medicare Other | Admitting: Family Medicine

## 2013-01-14 ENCOUNTER — Encounter: Payer: Self-pay | Admitting: Family Medicine

## 2013-01-14 VITALS — BP 138/82 | HR 80 | Temp 97.7°F | Wt 189.6 lb

## 2013-01-14 DIAGNOSIS — I6529 Occlusion and stenosis of unspecified carotid artery: Secondary | ICD-10-CM

## 2013-01-14 DIAGNOSIS — Z8679 Personal history of other diseases of the circulatory system: Secondary | ICD-10-CM

## 2013-01-14 DIAGNOSIS — Z9889 Other specified postprocedural states: Secondary | ICD-10-CM

## 2013-01-14 DIAGNOSIS — E039 Hypothyroidism, unspecified: Secondary | ICD-10-CM | POA: Insufficient documentation

## 2013-01-14 DIAGNOSIS — E785 Hyperlipidemia, unspecified: Secondary | ICD-10-CM | POA: Insufficient documentation

## 2013-01-14 DIAGNOSIS — I10 Essential (primary) hypertension: Secondary | ICD-10-CM | POA: Insufficient documentation

## 2013-01-14 LAB — COMPLETE METABOLIC PANEL WITH GFR
ALT: 79 U/L — ABNORMAL HIGH (ref 0–53)
AST: 66 U/L — ABNORMAL HIGH (ref 0–37)
Albumin: 4.7 g/dL (ref 3.5–5.2)
Alkaline Phosphatase: 69 U/L (ref 39–117)
BUN: 15 mg/dL (ref 6–23)
CO2: 29 mEq/L (ref 19–32)
Calcium: 9.9 mg/dL (ref 8.4–10.5)
Chloride: 106 mEq/L (ref 96–112)
Creat: 1.02 mg/dL (ref 0.50–1.35)
GFR, Est African American: 84 mL/min
GFR, Est Non African American: 73 mL/min
Glucose, Bld: 97 mg/dL (ref 70–99)
Potassium: 4.9 mEq/L (ref 3.5–5.3)
Sodium: 144 mEq/L (ref 135–145)
Total Bilirubin: 0.7 mg/dL (ref 0.3–1.2)
Total Protein: 7.2 g/dL (ref 6.0–8.3)

## 2013-01-14 LAB — TSH: TSH: 2.534 u[IU]/mL (ref 0.350–4.500)

## 2013-01-14 MED ORDER — LISINOPRIL 40 MG PO TABS: 40.0000 mg | ORAL_TABLET | Freq: Every day | ORAL | Status: DC

## 2013-01-14 MED ORDER — ATORVASTATIN CALCIUM 40 MG PO TABS: ORAL_TABLET | ORAL | Status: DC

## 2013-01-14 MED ORDER — AMLODIPINE BESYLATE 5 MG PO TABS: ORAL_TABLET | ORAL | Status: DC

## 2013-01-14 MED ORDER — NIACIN ER (ANTIHYPERLIPIDEMIC) 750 MG PO TBCR: EXTENDED_RELEASE_TABLET | ORAL | Status: DC

## 2013-01-14 MED ORDER — LEVOTHYROXINE SODIUM 75 MCG PO TABS: 75.0000 ug | ORAL_TABLET | Freq: Every day | ORAL | Status: DC

## 2013-01-14 NOTE — Progress Notes (Signed)
Patient ID: [name] [name], male   DOB: 12-28-1940, 72 y.o.   MRN: [id number] SUBJECTIVE: CC: Chief Complaint  Patient presents with  . Follow-up    8 month follow up needs refill .. no complaints . fasting for labs     HPI: No problems Patient is here for follow up of hyperlipidemia: denies Headache;denies Chest Pain;denies weakness;denies Shortness of Breath and orthopnea;denies Visual changes;denies palpitations;denies cough;denies pedal edema;denies symptoms of TIA or stroke;deniesClaudication symptoms. admits to Compliance with medications; denies Problems with medications.  Breakfast:eggs, bacon, mostly biscuit Lunch: vegetables, chicken Supper: tomato sandwich, vegetables  Farmer: garden: raw beans , corn, tomatoes, cucumbers.  Past Medical History  Diagnosis Date  . Arthritis   . Hyperlipidemia   . Hypertension   . Joint pain   . Weight gain   . AAA (abdominal aortic aneurysm)   . 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 Atherosclerotic peripheral vascular disease   . AAA (abdominal aortic aneurysm)   . COPD (chronic obstructive pulmonary disease)   . Thyroid disease     hypothyroid  . Bilateral carotid artery stenosis    Past Surgical History  Procedure Laterality Date  . Abdominal aortic aneurysm repair    . Back surgery    . Hydrocele excision / repair     History   Social History  . Marital Status: Married    Spouse Name: N/A    Number of Children: N/A  . Years of Education: N/A   Occupational History  . Not on file.   Social History Main Topics  . Smoking status: Former Smoker -- 1.00 packs/day    Types: Cigarettes    Quit date: 10/27/2005  . Smokeless tobacco: Not on file  . Alcohol Use: No  . Drug Use: No  . Sexually Active:    Other Topics Concern  . Not on file   Social History Narrative  . No narrative on file   Family History  Problem Relation Age of Onset  . Heart disease Father    Current Outpatient  Prescriptions on File Prior to Visit  Medication Sig Dispense Refill  . amLODipine (NORVASC) 5 MG tablet TAKE ONE TABLET BY MOUTH ONE TIME DAILY  30 tablet  0  . aspirin 325 MG EC tablet Take 325 mg by mouth daily.        Marland Kitchen atorvastatin (LIPITOR) 40 MG tablet TAKE ONE TABLET BY MOUTH ONE TIME DAILY  30 tablet  0  . cholecalciferol (VITAMIN D) 1000 UNITS tablet Take 1,000 Units by mouth daily.        Marland Kitchen levothyroxine (SYNTHROID, LEVOTHROID) 75 MCG tablet Take 75 mcg by mouth daily.        Marland Kitchen lisinopril (PRINIVIL,ZESTRIL) 40 MG tablet Take 1 tablet (40 mg total) by mouth daily.  30 tablet  0  . Multiple Vitamin (ONE-A-DAY MENS PO) Take 1 tablet by mouth daily.        Marland Kitchen NIASPAN 750 MG CR tablet TAKE TWO TABLETS BY MOUTH DAILY AT BEDTIME  60 tablet  0   No current facility-administered medications on file prior to visit.   No Known Allergies  There is no immunization history on file for this patient. Prior to Admission medications   Medication Sig Start Date End Date Taking? Authorizing Provider  amLODipine (NORVASC) 5 MG tablet TAKE ONE TABLET BY MOUTH ONE TIME DAILY 12/12/12  Yes Ileana Ladd, MD  aspirin 325 MG EC tablet Take  325 mg by mouth daily.     Yes Historical Provider, MD  atorvastatin (LIPITOR) 40 MG tablet TAKE ONE TABLET BY MOUTH ONE TIME DAILY 12/12/12  Yes Ileana Ladd, MD  cholecalciferol (VITAMIN D) 1000 UNITS tablet Take 1,000 Units by mouth daily.     Yes Historical Provider, MD  levothyroxine (SYNTHROID, LEVOTHROID) 75 MCG tablet Take 75 mcg by mouth daily.     Yes Historical Provider, MD  lisinopril (PRINIVIL,ZESTRIL) 40 MG tablet Take 1 tablet (40 mg total) by mouth daily. 12/14/12  Yes Ileana Ladd, MD  Multiple Vitamin (ONE-A-DAY MENS PO) Take 1 tablet by mouth daily.     Yes Historical Provider, MD  NIASPAN 750 MG CR tablet TAKE TWO TABLETS BY MOUTH DAILY AT BEDTIME 12/26/12  Yes Ileana Ladd, MD  Omega-3 Fatty Acids (FISH OIL) 1200 MG CAPS Take 1 capsule by mouth  daily.   Yes Historical Provider, MD     ROS: As above in the HPI. All other systems are stable or negative.  OBJECTIVE: APPEARANCE:  Patient in no acute distress.The patient appeared well nourished and normally developed. Acyanotic. Waist: VITAL SIGNS:BP 138/82  Pulse 80  Temp(Src) 97.7 F (36.5 C) (Oral)  Wt 189 lb 9.6 oz (86.002 kg)  BMI 26.46 kg/m2  WM  SKIN: warm and  Dry without overt rashes, tattoos and scars  HEAD and Neck: without JVD, Head and scalp: normal Eyes:No scleral icterus. Fundi normal, eye movements normal. Ears: Auricle normal, canal normal, Tympanic membranes normal, insufflation normal. Nose: normal Throat: normal Neck & thyroid: normal  CHEST & LUNGS: Chest wall: normal Lungs: Clear  CVS: Reveals the PMI to be normally located. Regular rhythm, First and Second Heart sounds are normal,  absence of murmurs, rubs or gallops. Peripheral vasculature: Radial pulses: normal Dorsal pedis pulses: normal Posterior pulses: normal  ABDOMEN:  Appearance: normal Benign, no organomegaly, no masses, no Abdominal Aortic enlargement. No Guarding , no rebound. No Bruits. Bowel sounds: normal  RECTAL: N/A GU: N/A  EXTREMETIES: nonedematous. Both Femoral and Pedal pulses are normal.  MUSCULOSKELETAL:  Spine: normal Joints: intact  NEUROLOGIC: oriented to time,place and person; nonfocal. Strength is normal Sensory is normal Reflexes are normal Cranial Nerves are normal.  ASSESSMENT: HLD (hyperlipidemia) - Plan: COMPLETE METABOLIC PANEL WITH GFR, NMR Lipoprofile with Lipids, atorvastatin (LIPITOR) 40 MG tablet, niacin (NIASPAN) 750 MG CR tablet  HTN (hypertension) - Plan: COMPLETE METABOLIC PANEL WITH GFR, lisinopril (PRINIVIL,ZESTRIL) 40 MG tablet, amLODipine (NORVASC) 5 MG tablet  Unspecified hypothyroidism - Plan: TSH, levothyroxine (SYNTHROID, LEVOTHROID) 75 MCG tablet  Occlusion and stenosis of carotid artery without mention of cerebral  infarction, bilateral - Plan: NMR Lipoprofile with Lipids  S/P AAA repair - Plan: NMR Lipoprofile with Lipids  PLAN: Orders Placed This Encounter  Procedures  . TSH  . COMPLETE METABOLIC PANEL WITH GFR  . NMR Lipoprofile with Lipids    Meds ordered this encounter  Medications  . Omega-3 Fatty Acids (FISH OIL) 1200 MG CAPS    Sig: Take 1 capsule by mouth daily.  Marland Kitchen lisinopril (PRINIVIL,ZESTRIL) 40 MG tablet    Sig: Take 1 tablet (40 mg total) by mouth daily.    Dispense:  30 tablet    Refill:  11    NTBS  . amLODipine (NORVASC) 5 MG tablet    Sig: TAKE ONE TABLET BY MOUTH ONE TIME DAILY    Dispense:  30 tablet    Refill:  11  . atorvastatin (LIPITOR) 40 MG  tablet    Sig: TAKE ONE TABLET BY MOUTH ONE TIME DAILY    Dispense:  30 tablet    Refill:  11  . niacin (NIASPAN) 750 MG CR tablet    Sig: TAKE TWO TABLETS BY MOUTH DAILY AT BEDTIME    Dispense:  60 tablet    Refill:  11  . levothyroxine (SYNTHROID, LEVOTHROID) 75 MCG tablet    Sig: Take 1 tablet (75 mcg total) by mouth daily.    Dispense:  30 tablet    Refill:  11        Dr Woodroe Mode Recommendations  Diet and Exercise discussed with patient.  For nutrition information, I recommend books:  1).Eat to Live by Dr Monico Hoar. 2).Prevent and Reverse Heart Disease by Dr Suzzette Righter. 3) Dr Katherina Right Book:  Program to Reverse Diabetes  Exercise recommendations are:  If unable to walk, then the patient can exercise in a chair 3 times a day. By flapping arms like a bird gently and raising legs outwards to the front.  If ambulatory, the patient can go for walks for 30 minutes 3 times a week. Then increase the intensity and duration as tolerated.  Goal is to try to attain exercise frequency to 5 times a week.  If applicable: Best to perform resistance exercises (machines or weights) 2 days a week and cardio type exercises 3 days per week.  Return in about 4 months (around 05/17/2013) for Recheck  medical problems.  Takeru Bose P. [place] Charon, M.D.

## 2013-01-14 NOTE — Patient Instructions (Addendum)
      Dr Bailynn Dyk's Recommendations  Diet and Exercise discussed with patient.  For nutrition information, I recommend books:  1).Eat to Live by Dr Joel Fuhrman. 2).Prevent and Reverse Heart Disease by Dr Caldwell Esselstyn. 3) Dr Neal Barnard's Book:  Program to Reverse Diabetes  Exercise recommendations are:  If unable to walk, then the patient can exercise in a chair 3 times a day. By flapping arms like a bird gently and raising legs outwards to the front.  If ambulatory, the patient can go for walks for 30 minutes 3 times a week. Then increase the intensity and duration as tolerated.  Goal is to try to attain exercise frequency to 5 times a week.  If applicable: Best to perform resistance exercises (machines or weights) 2 days a week and cardio type exercises 3 days per week.  

## 2013-01-17 LAB — NMR LIPOPROFILE WITH LIPIDS
Cholesterol, Total: 118 mg/dL (ref <200)
HDL Particle Number: 25.6 umol/L — ABNORMAL LOW (ref >=30.5)
HDL Size: 8.3 nm — ABNORMAL LOW (ref >=9.2)
HDL-C: 30 mg/dL — ABNORMAL LOW (ref >=40)
LDL (calc): 52 mg/dL (ref <100)
LDL Particle Number: 901 nmol/L (ref <1000)
LDL Size: 20.4 nm — ABNORMAL LOW (ref >20.5)
LP-IR Score: 71 — ABNORMAL HIGH (ref <=45)
Large HDL-P: <1.3 umol/L — ABNORMAL LOW (ref >=4.8)
Large VLDL-P: 3.5 nmol/L — ABNORMAL HIGH (ref <=2.7)
Small LDL Particle Number: 457 nmol/L (ref <=527)
Triglycerides: 178 mg/dL — ABNORMAL HIGH (ref <150)
VLDL Size: 47.7 nm — ABNORMAL HIGH (ref <=46.6)

## 2013-01-19 NOTE — Progress Notes (Signed)
Quick Note:  Labs abnormal. Liver enzymes are elevated. Needs to be seen to evaluate what is happening to the liver. ______

## 2013-01-24 ENCOUNTER — Ambulatory Visit (INDEPENDENT_AMBULATORY_CARE_PROVIDER_SITE_OTHER): Payer: Medicare Other | Admitting: Family Medicine

## 2013-01-24 ENCOUNTER — Encounter: Payer: Self-pay | Admitting: Family Medicine

## 2013-01-24 VITALS — BP 132/75 | HR 70 | Temp 97.8°F | Wt 188.2 lb

## 2013-01-24 DIAGNOSIS — R748 Abnormal levels of other serum enzymes: Secondary | ICD-10-CM

## 2013-01-24 DIAGNOSIS — R7402 Elevation of levels of lactic acid dehydrogenase (LDH): Secondary | ICD-10-CM

## 2013-01-24 NOTE — Progress Notes (Signed)
Subjective:     Patient ID: [name] [name], male   DOB: 27-Dec-1940, 72 y.o.   MRN: [id number] Chief Complaint  Patient presents with  . Follow-up    discuss labs      HPI Here for follow up of his elevated liver enzymes. No symptoms. Making dietary changes. He believes it is caused by the niacin and he gets hot flushes and wants to stop. No liver scan before.   Past Medical History  Diagnosis Date  . Arthritis   . Hyperlipidemia   . Hypertension   . Joint pain   . Weight gain   . AAA (abdominal aortic aneurysm)   . 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 Atherosclerotic peripheral vascular disease   . AAA (abdominal aortic aneurysm)   . COPD (chronic obstructive pulmonary disease)   . Thyroid disease     hypothyroid  . Bilateral carotid artery stenosis    Past Surgical History  Procedure Laterality Date  . Abdominal aortic aneurysm repair    . Back surgery    . Hydrocele excision / repair     History   Social History  . Marital Status: Married    Spouse Name: N/A    Number of Children: N/A  . Years of Education: N/A   Occupational History  . Not on file.   Social History Main Topics  . Smoking status: Former Smoker -- 1.00 packs/day    Types: Cigarettes    Quit date: 10/27/2005  . Smokeless tobacco: Not on file  . Alcohol Use: No  . Drug Use: No  . Sexually Active:    Other Topics Concern  . Not on file   Social History Narrative  . No narrative on file   Family History  Problem Relation Age of Onset  . Heart disease Father    Current Outpatient Prescriptions on File Prior to Visit  Medication Sig Dispense Refill  . amLODipine (NORVASC) 5 MG tablet TAKE ONE TABLET BY MOUTH ONE TIME DAILY  30 tablet  11  . aspirin 325 MG EC tablet Take 325 mg by mouth daily.        Marland Kitchen atorvastatin (LIPITOR) 40 MG tablet TAKE ONE TABLET BY MOUTH ONE TIME DAILY  30 tablet  11  . cholecalciferol (VITAMIN D) 1000 UNITS tablet Take 1,000 Units by mouth  daily.        Marland Kitchen levothyroxine (SYNTHROID, LEVOTHROID) 75 MCG tablet Take 1 tablet (75 mcg total) by mouth daily.  30 tablet  11  . lisinopril (PRINIVIL,ZESTRIL) 40 MG tablet Take 1 tablet (40 mg total) by mouth daily.  30 tablet  11  . Multiple Vitamin (ONE-A-DAY MENS PO) Take 1 tablet by mouth daily.        . niacin (NIASPAN) 750 MG CR tablet TAKE TWO TABLETS BY MOUTH DAILY AT BEDTIME  60 tablet  11  . Omega-3 Fatty Acids (FISH OIL) 1200 MG CAPS Take 1 capsule by mouth daily.       No current facility-administered medications on file prior to visit.   No Known Allergies  There is no immunization history on file for this patient. Prior to Admission medications   Medication Sig Start Date End Date Taking? Authorizing Provider  amLODipine (NORVASC) 5 MG tablet TAKE ONE TABLET BY MOUTH ONE TIME DAILY 01/14/13  Yes Ileana Ladd, MD  aspirin 325 MG EC tablet Take 325 mg by mouth daily.     Yes  Historical Provider, MD  atorvastatin (LIPITOR) 40 MG tablet TAKE ONE TABLET BY MOUTH ONE TIME DAILY 01/14/13  Yes Ileana Ladd, MD  cholecalciferol (VITAMIN D) 1000 UNITS tablet Take 1,000 Units by mouth daily.     Yes Historical Provider, MD  levothyroxine (SYNTHROID, LEVOTHROID) 75 MCG tablet Take 1 tablet (75 mcg total) by mouth daily. 01/14/13  Yes Ileana Ladd, MD  lisinopril (PRINIVIL,ZESTRIL) 40 MG tablet Take 1 tablet (40 mg total) by mouth daily. 01/14/13  Yes Ileana Ladd, MD  Multiple Vitamin (ONE-A-DAY MENS PO) Take 1 tablet by mouth daily.     Yes Historical Provider, MD  niacin (NIASPAN) 750 MG CR tablet TAKE TWO TABLETS BY MOUTH DAILY AT BEDTIME 01/14/13  Yes Ileana Ladd, MD  Omega-3 Fatty Acids (FISH OIL) 1200 MG CAPS Take 1 capsule by mouth daily.   Yes Historical Provider, MD     Review of Systems Nil else new.    Objective:   Physical Exam    APPEARANCE:  Patient in no acute distress.The patient appeared well nourished and normally developed. Acyanotic. Waist: VITAL  SIGNS:BP 132/75  Pulse 70  Temp(Src) 97.8 F (36.6 C) (Oral)  Wt 188 lb 3.2 oz (85.367 kg)  BMI 26.26 kg/m2 WM  SKIN: warm and  Dry without overt rashes, tattoos and scars  HEAD and Neck: without JVD, Head and scalp: normal Eyes:No scleral icterus. Fundi normal, eye movements normal. Ears: Auricle normal, canal normal, Tympanic membranes normal, insufflation normal. Nose: normal Throat: normal Neck & thyroid: normal  CHEST & LUNGS: Chest wall: normal Lungs: Clear  CVS: Reveals the PMI to be normally located. Regular rhythm, First and Second Heart sounds are normal,  absence of murmurs, rubs or gallops. Peripheral vasculature: Radial pulses: normal Dorsal pedis pulses: normal Posterior pulses: normal  ABDOMEN:  Appearance: normal Benign,, no organomegaly, no masses, no Abdominal Aortic enlargement. No Guarding , no rebound. No Bruits. Bowel sounds: normal  RECTAL: N/A GU: N/A  EXTREMETIES: nonedematous.  Results for orders placed in visit on 01/14/13  TSH      Result Value Range   TSH 2.534  0.350 - 4.500 uIU/mL  COMPLETE METABOLIC PANEL WITH GFR      Result Value Range   Sodium 144  135 - 145 mEq/L   Potassium 4.9  3.5 - 5.3 mEq/L   Chloride 106  96 - 112 mEq/L   CO2 29  19 - 32 mEq/L   Glucose, Bld 97  70 - 99 mg/dL   BUN 15  6 - 23 mg/dL   Creat 6.29  5.28 - 4.13 mg/dL   Total Bilirubin 0.7  0.3 - 1.2 mg/dL   Alkaline Phosphatase 69  39 - 117 U/L   AST 66 (*) 0 - 37 U/L   ALT 79 (*) 0 - 53 U/L   Total Protein 7.2  6.0 - 8.3 g/dL   Albumin 4.7  3.5 - 5.2 g/dL   Calcium 9.9  8.4 - 24.4 mg/dL   GFR, Est African American 84     GFR, Est Non African American 73    NMR LIPOPROFILE WITH LIPIDS      Result Value Range   LDL Particle Number 901  <1000 nmol/L   LDL (calc) 52  <100 mg/dL   HDL-C 30 (*) >=01 mg/dL   Triglycerides 027 (*) <150 mg/dL   Cholesterol, Total 253  <200 mg/dL   HDL Particle Number 66.4 (*) >=30.5 umol/L   Large HDL-P <1.3 (*)  >=  4.8 umol/L   Large VLDL-P 3.5 (*) <=2.7 nmol/L   Small LDL Particle Number 457  <=527 nmol/L   LDL Size 20.4 (*) >20.5 nm   HDL Size 8.3 (*) >=9.2 nm   VLDL Size 47.7 (*) <=46.6 nm   LP-IR Score 71 (*) <=45     Assessment:     Abnormal transaminases - Plan: ANA, Ferritin, Ceruloplasmin, Alpha-1-Antitrypsin Phenotyp, Hepatitis panel, acute, US Abdomen Limited RUQ      Plan:     Orders Placed This Encounter  Procedures  . US Abdomen Limited RUQ    Standing Status: Future     Number of Occurrences:      Standing Expiration Date: 03/26/2014    Order Specific Question:  Reason for Exam (SYMPTOM  OR DIAGNOSIS REQUIRED)    Answer:  elevate liver transaminases.    Order Specific Question:  Preferred imaging location?    Answer:  [facility]  . ANA  . Ferritin  . Ceruloplasmin  . Alpha-1-Antitrypsin Phenotyp  . Hepatitis panel, acute   Reviewed labs with patient.  Medications Discontinued During This Encounter  Medication Reason  . niacin (NIASPAN) 750 MG CR tablet Discontinued by provider   Return in about 4 weeks (around 02/21/2013) for Recheck medical problems,recheck liver.  Liannah Yarbough P. [place]esto Charon, M.D.

## 2013-01-26 LAB — HEPATITIS PANEL, ACUTE
Hep A IgM: NEGATIVE
Hep B C IgM: NEGATIVE
Hep C Virus Ab: <0.1 s/co ratio (ref 0.0–0.9)
Hepatitis B Surface Ag: NEGATIVE

## 2013-01-26 LAB — ANA: Anti Nuclear Antibody(ANA): NEGATIVE

## 2013-01-26 LAB — FERRITIN: Ferritin: 206 ng/mL (ref 30–400)

## 2013-01-26 LAB — ALPHA-1-ANTITRYPSIN PHENOTYP: A-1 Antitrypsin: 142 mg/dL (ref 90–200)

## 2013-01-26 LAB — CERULOPLASMIN: Ceruloplasmin: 19.7 mg/dL (ref 15.0–30.0)

## 2013-01-27 ENCOUNTER — Ambulatory Visit (HOSPITAL_COMMUNITY)
Admission: RE | Admit: 2013-01-27 | Discharge: 2013-01-27 | Disposition: A | Discharge status: Home / Self Care | Payer: Medicare Other | Source: Ambulatory Visit | Attending: Family Medicine | Admitting: Family Medicine

## 2013-01-27 DIAGNOSIS — R7989 Other specified abnormal findings of blood chemistry: Secondary | ICD-10-CM | POA: Insufficient documentation

## 2013-01-27 DIAGNOSIS — R748 Abnormal levels of other serum enzymes: Secondary | ICD-10-CM

## 2013-01-27 DIAGNOSIS — I10 Essential (primary) hypertension: Secondary | ICD-10-CM | POA: Insufficient documentation

## 2013-01-27 NOTE — Progress Notes (Signed)
Quick Note:  Call patient. Labs normal. No change in plan. ______ 

## 2013-02-24 ENCOUNTER — Encounter: Payer: Self-pay | Admitting: Family Medicine

## 2013-02-24 ENCOUNTER — Ambulatory Visit (INDEPENDENT_AMBULATORY_CARE_PROVIDER_SITE_OTHER): Payer: Medicare Other | Admitting: Family Medicine

## 2013-02-24 VITALS — BP 121/70 | HR 71 | Temp 97.1°F | Wt 186.8 lb

## 2013-02-24 DIAGNOSIS — E039 Hypothyroidism, unspecified: Secondary | ICD-10-CM

## 2013-02-24 DIAGNOSIS — K824 Cholesterolosis of gallbladder: Secondary | ICD-10-CM

## 2013-02-24 DIAGNOSIS — I10 Essential (primary) hypertension: Secondary | ICD-10-CM

## 2013-02-24 DIAGNOSIS — R7402 Elevation of levels of lactic acid dehydrogenase (LDH): Secondary | ICD-10-CM

## 2013-02-24 DIAGNOSIS — E785 Hyperlipidemia, unspecified: Secondary | ICD-10-CM

## 2013-02-24 DIAGNOSIS — R748 Abnormal levels of other serum enzymes: Secondary | ICD-10-CM

## 2013-02-24 DIAGNOSIS — K760 Fatty (change of) liver, not elsewhere classified: Secondary | ICD-10-CM

## 2013-02-24 DIAGNOSIS — K7689 Other specified diseases of liver: Secondary | ICD-10-CM

## 2013-02-24 NOTE — Progress Notes (Signed)
Patient ID: [name] [name], male   DOB: [birth year], 72 y.o.   MRN: [id number] SUBJECTIVE: CC: Chief Complaint  Patient presents with  . Follow-up    4 week legs still ache but better since stopping niaspan    HPI: Patient is here for follow up of hyperlipidemia and abnormal transaminases.Had work up denies Headache;denies Chest Pain;denies weakness;denies Shortness of Breath and orthopnea;denies Visual changes;denies palpitations;denies cough;denies pedal edema;denies symptoms of TIA or stroke;deniesClaudication symptoms. admits to Compliance with medications; denies Problems with medications.  Trying to make dietary changes.  Past Medical History  Diagnosis Date  . Arthritis   . Hyperlipidemia   . Hypertension   . Joint pain   . Weight gain   . AAA (abdominal aortic aneurysm)   . 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 Atherosclerotic peripheral vascular disease   . AAA (abdominal aortic aneurysm)   . COPD (chronic obstructive pulmonary disease)   . Thyroid disease     hypothyroid  . Bilateral carotid artery stenosis    Past Surgical History  Procedure Laterality Date  . Abdominal aortic aneurysm repair    . Back surgery    . Hydrocele excision / repair     History   Social History  . Marital Status: Married    Spouse Name: N/A    Number of Children: N/A  . Years of Education: N/A   Occupational History  . Not on file.   Social History Main Topics  . Smoking status: Former Smoker -- 1.00 packs/day    Types: Cigarettes    Quit date: 10/27/2005  . Smokeless tobacco: Not on file  . Alcohol Use: No  . Drug Use: No  . Sexual Activity:    Other Topics Concern  . Not on file   Social History Narrative  . No narrative on file   Family History  Problem Relation Age of Onset  . Heart disease Father    Current Outpatient Prescriptions on File Prior to Visit  Medication Sig Dispense Refill  . amLODipine (NORVASC) 5 MG tablet TAKE ONE  TABLET BY MOUTH ONE TIME DAILY  30 tablet  11  . aspirin 325 MG EC tablet Take 325 mg by mouth daily.        Marland Kitchen atorvastatin (LIPITOR) 40 MG tablet TAKE ONE TABLET BY MOUTH ONE TIME DAILY  30 tablet  11  . cholecalciferol (VITAMIN D) 1000 UNITS tablet Take 1,000 Units by mouth daily.        Marland Kitchen levothyroxine (SYNTHROID, LEVOTHROID) 75 MCG tablet Take 1 tablet (75 mcg total) by mouth daily.  30 tablet  11  . lisinopril (PRINIVIL,ZESTRIL) 40 MG tablet Take 1 tablet (40 mg total) by mouth daily.  30 tablet  11  . Multiple Vitamin (ONE-A-DAY MENS PO) Take 1 tablet by mouth daily.        . Omega-3 Fatty Acids (FISH OIL) 1200 MG CAPS Take 1 capsule by mouth daily.       No current facility-administered medications on file prior to visit.   No Known Allergies  There is no immunization history on file for this patient. Prior to Admission medications   Medication Sig Start Date End Date Taking? Authorizing Provider  amLODipine (NORVASC) 5 MG tablet TAKE ONE TABLET BY MOUTH ONE TIME DAILY 01/14/13  Yes Ileana Ladd, MD  aspirin 325 MG EC tablet Take 325 mg by mouth daily.     Yes Historical Provider, MD  atorvastatin (LIPITOR) 40 MG tablet TAKE ONE TABLET BY MOUTH ONE TIME DAILY 01/14/13  Yes Ileana Ladd, MD  cholecalciferol (VITAMIN D) 1000 UNITS tablet Take 1,000 Units by mouth daily.     Yes Historical Provider, MD  levothyroxine (SYNTHROID, LEVOTHROID) 75 MCG tablet Take 1 tablet (75 mcg total) by mouth daily. 01/14/13  Yes Ileana Ladd, MD  lisinopril (PRINIVIL,ZESTRIL) 40 MG tablet Take 1 tablet (40 mg total) by mouth daily. 01/14/13  Yes Ileana Ladd, MD  Multiple Vitamin (ONE-A-DAY MENS PO) Take 1 tablet by mouth daily.     Yes Historical Provider, MD  Omega-3 Fatty Acids (FISH OIL) 1200 MG CAPS Take 1 capsule by mouth daily.   Yes Historical Provider, MD     ROS: As above in the HPI. All other systems are stable or negative.  OBJECTIVE: APPEARANCE:  Patient in no acute distress.The  patient appeared well nourished and normally developed. Acyanotic. Waist: VITAL SIGNS:BP 121/70  Pulse 71  Temp(Src) 97.1 F (36.2 C) (Oral)  Wt 186 lb 12.8 oz (84.732 kg)  BMI 26.06 kg/m2  WM  SKIN: warm and  Dry without overt rashes, tattoos and scars  HEAD and Neck: without JVD, Head and scalp: normal Eyes:No scleral icterus. Fundi normal, eye movements normal. Ears: Auricle normal, canal normal, Tympanic membranes normal, insufflation normal. Nose: normal Throat: normal Neck & thyroid: normal  CHEST & LUNGS: Chest wall: normal Lungs: Clear  CVS: Reveals the PMI to be normally located. Regular rhythm, First and Second Heart sounds are normal,  absence of murmurs, rubs or gallops. Peripheral vasculature: Radial pulses: normal Dorsal pedis pulses: normal Posterior pulses: normal  ABDOMEN:  Appearance: normal Benign, no organomegaly, no masses, no Abdominal Aortic enlargement. No Guarding , no rebound. No Bruits. Bowel sounds: normal  RECTAL: N/A GU: N/A  EXTREMETIES: nonedematous.  MUSCULOSKELETAL:  Spine: normal Joints: intact  NEUROLOGIC: oriented to time,place and person; nonfocal. Strength is normal Sensory is normal Reflexes are normal Cranial Nerves are normal.  Results for orders placed in visit on 01/24/13  ANA      Result Value Range   ANA Negative  Negative  FERRITIN      Result Value Range   Ferritin 206  30 - 400 ng/mL  CERULOPLASMIN      Result Value Range   Ceruloplasmin 19.7  15.0 - 30.0 mg/dL  [id number] PHENOTYP      Result Value Range   A-1 Antitrypsin 142  90 - 200 mg/dL   A-1 Antitrypsin Pheno MM    HEPATITIS PANEL, ACUTE      Result Value Range   Hep A IgM Negative  Negative   Hepatitis B Surface Ag Negative  Negative   Hep B C IgM Negative  Negative   Hep C Virus Ab <0.1  0.0 - 0.9 s/co ratio    ASSESSMENT: HLD (hyperlipidemia)  HTN (hypertension)  Unspecified hypothyroidism  Abnormal transaminases -  Plan: Hepatic function panel  Fatty liver  Gallbladder polyp   PLAN: *RADIOLOGY REPORT*  Clinical Data: Elevated LFTs, hypertension, increased cholesterol  ABDOMEN ULTRASOUND LIMITED  Technique: Right upper quadrant ultrasound  Comparison: CT scan 09/30/2010  Findings: No shadowing gallstones are noted within gallbladder.  Probable gallbladder wall polyp measures 4.6 mm. No thickening of  the gallbladder wall. No pericholecystic fluid. No sonographic  Murphy's sign.  Liver shows diffuse increased echogenicity. This may be due to  fatty infiltration or chronic liver disease. No focal hepatic  mass. No intrahepatic biliary  ductal dilatation.  CBD is within normal limits measures 5 mm in diameter.  IMPRESSION:  1. No shadowing gallstones are noted within gallbladder. Probable  gallbladder wall polyp measures 4.6 mm. Follow-up ultrasound in 6  months is suggested to assure stability.  2. No sonographic Murphy's sign. Normal CBD.  3. Diffuse increased echogenicity of the liver suspicious for  fatty infiltration or chronic liver disease. No focal hepatic  mass. No intrahepatic biliary ductal dilatation.  Original Report Authenticated By: Natasha Mead, M.D.  Orders Placed This Encounter  Procedures  . Hepatic function panel   Labs and work up reviewed with patient Discussed fatty liver.  Return in about 2 months (around 04/26/2013) for Recheck medical problems.  Tani Virgo P. [place] Charon, M.D.

## 2013-02-25 LAB — HEPATIC FUNCTION PANEL
ALT: 92 IU/L — ABNORMAL HIGH (ref 0–44)
AST: 64 IU/L — ABNORMAL HIGH (ref 0–40)
Albumin: 5.2 g/dL — ABNORMAL HIGH (ref 3.5–4.8)
Alkaline Phosphatase: 68 IU/L (ref 39–117)
Bilirubin, Direct: 0.22 mg/dL (ref 0.00–0.40)
Total Bilirubin: 0.7 mg/dL (ref 0.0–1.2)
Total Protein: 7.1 g/dL (ref 6.0–8.5)

## 2013-03-02 NOTE — Progress Notes (Signed)
Quick Note:  Labs abnormal. Liver transaminases still high. Has fatty liver and also maybe medication effect. He needs to diet aggressively and remove fat and sugars from diet.  Read the Book "Eat to Live" Fatty liver is now the number 1 cause for cirrhosis in the [place]. ______

## 2013-04-22 ENCOUNTER — Encounter: Payer: Self-pay | Admitting: *Deleted

## 2013-04-26 ENCOUNTER — Encounter: Payer: Self-pay | Admitting: Family Medicine

## 2013-04-26 ENCOUNTER — Ambulatory Visit (INDEPENDENT_AMBULATORY_CARE_PROVIDER_SITE_OTHER): Payer: Medicare Other | Admitting: Family Medicine

## 2013-04-26 VITALS — BP 154/75 | HR 64 | Temp 97.9°F | Ht 71.0 in | Wt 185.0 lb

## 2013-04-26 DIAGNOSIS — Z23 Encounter for immunization: Secondary | ICD-10-CM

## 2013-04-26 DIAGNOSIS — K824 Cholesterolosis of gallbladder: Secondary | ICD-10-CM

## 2013-04-26 DIAGNOSIS — E785 Hyperlipidemia, unspecified: Secondary | ICD-10-CM

## 2013-04-26 DIAGNOSIS — R7402 Elevation of levels of lactic acid dehydrogenase (LDH): Secondary | ICD-10-CM

## 2013-04-26 DIAGNOSIS — K7689 Other specified diseases of liver: Secondary | ICD-10-CM

## 2013-04-26 DIAGNOSIS — I6529 Occlusion and stenosis of unspecified carotid artery: Secondary | ICD-10-CM

## 2013-04-26 DIAGNOSIS — Z8679 Personal history of other diseases of the circulatory system: Secondary | ICD-10-CM

## 2013-04-26 DIAGNOSIS — R748 Abnormal levels of other serum enzymes: Secondary | ICD-10-CM

## 2013-04-26 DIAGNOSIS — Z9889 Other specified postprocedural states: Secondary | ICD-10-CM

## 2013-04-26 DIAGNOSIS — I10 Essential (primary) hypertension: Secondary | ICD-10-CM

## 2013-04-26 DIAGNOSIS — E039 Hypothyroidism, unspecified: Secondary | ICD-10-CM

## 2013-04-26 DIAGNOSIS — K760 Fatty (change of) liver, not elsewhere classified: Secondary | ICD-10-CM

## 2013-04-26 NOTE — Progress Notes (Signed)
SUBJECTIVE: CC: Chief Complaint  Patient presents with  . 3 MO FU    STOPPED NIASPAN. INSURANCE WILL NOT PAY FOR.    HPI: Patient is here for follow up of hyperlipidemia/HTN: denies Headache;denies Chest Pain;denies weakness;denies Shortness of Breath and orthopnea;denies Visual changes;denies palpitations;denies cough;denies pedal edema;denies symptoms of TIA or stroke;deniesClaudication symptoms. admits to Compliance with medications; denies Problems with medications.   Past Medical History  Diagnosis Date  . Arthritis   . Hyperlipidemia   . Hypertension   . Joint pain   . Weight gain   . AAA (abdominal aortic aneurysm)   . 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 Atherosclerotic peripheral vascular disease   . AAA (abdominal aortic aneurysm)   . COPD (chronic obstructive pulmonary disease)   . Thyroid disease     hypothyroid  . Bilateral carotid artery stenosis    Past Surgical History  Procedure Laterality Date  . Abdominal aortic aneurysm repair    . Back surgery    . Hydrocele excision / repair     History   Social History  . Marital Status: Married    Spouse Name: N/A    Number of Children: N/A  . Years of Education: N/A   Occupational History  . Not on file.   Social History Main Topics  . Smoking status: Former Smoker -- 1.00 packs/day    Types: Cigarettes    Quit date: 10/27/2005  . Smokeless tobacco: Not on file  . Alcohol Use: No  . Drug Use: No  . Sexual Activity:    Other Topics Concern  . Not on file   Social History Narrative  . No narrative on file   Family History  Problem Relation Age of Onset  . Heart disease Father    Current Outpatient Prescriptions on File Prior to Visit  Medication Sig Dispense Refill  . amLODipine (NORVASC) 5 MG tablet TAKE ONE TABLET BY MOUTH ONE TIME DAILY  30 tablet  11  . aspirin 325 MG EC tablet Take 325 mg by mouth daily.        Marland Kitchen atorvastatin (LIPITOR) 40 MG tablet TAKE ONE TABLET  BY MOUTH ONE TIME DAILY  30 tablet  11  . cholecalciferol (VITAMIN D) 1000 UNITS tablet Take 1,000 Units by mouth daily.        Marland Kitchen levothyroxine (SYNTHROID, LEVOTHROID) 75 MCG tablet Take 1 tablet (75 mcg total) by mouth daily.  30 tablet  11  . lisinopril (PRINIVIL,ZESTRIL) 40 MG tablet Take 1 tablet (40 mg total) by mouth daily.  30 tablet  11  . Multiple Vitamin (ONE-A-DAY MENS PO) Take 1 tablet by mouth daily.        . Omega-3 Fatty Acids (FISH OIL) 1200 MG CAPS Take 1 capsule by mouth daily.       No current facility-administered medications on file prior to visit.   No Known Allergies Immunization History  Administered Date(s) Administered  . Influenza Split 03/23/2013  . Pneumococcal Conjugate 04/26/2013   Prior to Admission medications   Medication Sig Start Date End Date Taking? Authorizing Provider  amLODipine (NORVASC) 5 MG tablet TAKE ONE TABLET BY MOUTH ONE TIME DAILY 01/14/13  Yes Ileana Ladd, MD  aspirin 325 MG EC tablet Take 325 mg by mouth daily.     Yes Historical Provider, MD  atorvastatin (LIPITOR) 40 MG tablet TAKE ONE TABLET BY MOUTH ONE TIME DAILY 01/14/13  Yes Ileana Ladd, MD  cholecalciferol (VITAMIN D) 1000 UNITS tablet Take 1,000 Units by mouth daily.     Yes Historical Provider, MD  levothyroxine (SYNTHROID, LEVOTHROID) 75 MCG tablet Take 1 tablet (75 mcg total) by mouth daily. 01/14/13  Yes Ileana Ladd, MD  lisinopril (PRINIVIL,ZESTRIL) 40 MG tablet Take 1 tablet (40 mg total) by mouth daily. 01/14/13  Yes Ileana Ladd, MD  Multiple Vitamin (ONE-A-DAY MENS PO) Take 1 tablet by mouth daily.     Yes Historical Provider, MD  Omega-3 Fatty Acids (FISH OIL) 1200 MG CAPS Take 1 capsule by mouth daily.   Yes Historical Provider, MD     ROS: As above in the HPI. All other systems are stable or negative.  OBJECTIVE: APPEARANCE:  Patient in no acute distress.The patient appeared well nourished and normally developed. Acyanotic. Waist:39.5 inches VITAL  SIGNS:BP 154/75  Pulse 64  Temp(Src) 97.9 F (36.6 C) (Oral)  Ht 5\' 11"  (1.803 m)  Wt 185 lb (83.915 kg)  BMI 25.81 kg/m2 WM  SKIN: warm and  Dry without overt rashes, tattoos and scars  HEAD and Neck: without JVD, Head and scalp: normal Eyes:No scleral icterus. Fundi normal, eye movements normal. Ears: Auricle normal, canal normal, Tympanic membranes normal, insufflation normal. Nose: normal Throat: normal Neck & thyroid: normal  CHEST & LUNGS: Chest wall: normal Lungs: Clear  CVS: Reveals the PMI to be normally located. Regular rhythm, First and Second Heart sounds are normal,  absence of murmurs, rubs or gallops. Peripheral vasculature: Radial pulses: normal Dorsal pedis pulses: normal Posterior pulses: normal  ABDOMEN:  Appearance: normal Benign, no organomegaly, no masses, no Abdominal Aortic enlargement. No Guarding , no rebound. No Bruits. Bowel sounds: normal  RECTAL: N/A GU: N/A  EXTREMETIES: nonedematous.  MUSCULOSKELETAL:  Spine: normal Joints: intact  NEUROLOGIC: oriented to time,place and person; nonfocal.   Results for orders placed in visit on 02/24/13  HEPATIC FUNCTION PANEL      Result Value Range   Total Protein 7.1  6.0 - 8.5 g/dL   Albumin 5.2 (*) 3.5 - 4.8 g/dL   Total Bilirubin 0.7  0.0 - 1.2 mg/dL   Bilirubin, Direct 8.29  0.00 - 0.40 mg/dL   Alkaline Phosphatase 68  39 - 117 IU/L   AST 64 (*) 0 - 40 IU/L   ALT 92 (*) 0 - 44 IU/L    ASSESSMENT: Abnormal transaminases  Fatty liver  Gallbladder polyp  HLD (hyperlipidemia) - Plan: CMP14+EGFR, NMR, lipoprofile, Pneumococcal conjugate vaccine 13-valent less than 5yo IM  HTN (hypertension) - Plan: CMP14+EGFR, Pneumococcal conjugate vaccine 13-valent less than 5yo IM  Occlusion and stenosis of carotid artery without mention of cerebral infarction, unspecified laterality  S/P AAA repair  Unspecified hypothyroidism - Plan: TSH, Pneumococcal conjugate vaccine 13-valent less  than 5yo IM  Need for prophylactic vaccination against Streptococcus pneumoniae (pneumococcus) - Plan: Pneumococcal conjugate vaccine 13-valent less than 5yo IM  PLAN:  Orders Placed This Encounter  Procedures  . Pneumococcal conjugate vaccine 13-valent less than 5yo IM  . CMP14+EGFR  . NMR, lipoprofile  . TSH       Dr Woodroe Mode Recommendations  For nutrition information, I recommend books:  1).Eat to Live by Dr Monico Hoar. 2).Prevent and Reverse Heart Disease by Dr Suzzette Righter. 3) Dr Katherina Right Book:  Program to Reverse Diabetes  Exercise recommendations are:  If unable to walk, then the patient can exercise in a chair 3 times a day. By flapping arms like a bird gently and raising  legs outwards to the front.  If ambulatory, the patient can go for walks for 30 minutes 3 times a week. Then increase the intensity and duration as tolerated.  Goal is to try to attain exercise frequency to 5 times a week.  If applicable: Best to perform resistance exercises (machines or weights) 2 days a week and cardio type exercises 3 days per week.  No orders of the defined types were placed in this encounter.   There are no discontinued medications. Return in about 4 months (around 08/24/2013) for Recheck medical problems.  Avari Nevares P. [place]esto Charon, M.D.

## 2013-04-26 NOTE — Patient Instructions (Signed)
      Dr Taino Maertens's Recommendations  For nutrition information, I recommend books:  1).Eat to Live by Dr Joel Fuhrman. 2).Prevent and Reverse Heart Disease by Dr Caldwell Esselstyn. 3) Dr Neal Barnard's Book:  Program to Reverse Diabetes  Exercise recommendations are:  If unable to walk, then the patient can exercise in a chair 3 times a day. By flapping arms like a bird gently and raising legs outwards to the front.  If ambulatory, the patient can go for walks for 30 minutes 3 times a week. Then increase the intensity and duration as tolerated.  Goal is to try to attain exercise frequency to 5 times a week.  If applicable: Best to perform resistance exercises (machines or weights) 2 days a week and cardio type exercises 3 days per week.  

## 2013-04-28 ENCOUNTER — Other Ambulatory Visit: Payer: Self-pay | Admitting: Family Medicine

## 2013-04-28 DIAGNOSIS — R899 Unspecified abnormal finding in specimens from other organs, systems and tissues: Secondary | ICD-10-CM

## 2013-04-28 LAB — NMR, LIPOPROFILE
Cholesterol: 120 mg/dL (ref <200)
HDL Cholesterol by NMR: 32 mg/dL — ABNORMAL LOW (ref >=40)
HDL Particle Number: 27 umol/L — ABNORMAL LOW (ref >=30.5)
LDL Particle Number: 1162 nmol/L — ABNORMAL HIGH (ref <1000)
LDL Size: 19.9 nm — ABNORMAL LOW (ref >20.5)
LDLC SERPL CALC-MCNC: 47 mg/dL (ref <100)
LP-IR Score: 72 — ABNORMAL HIGH (ref <=45)
Small LDL Particle Number: 902 nmol/L — ABNORMAL HIGH (ref <=527)
Triglycerides by NMR: 203 mg/dL — ABNORMAL HIGH (ref <150)

## 2013-04-28 LAB — CMP14+EGFR
ALT: 70 IU/L — ABNORMAL HIGH (ref 0–44)
AST: 57 IU/L — ABNORMAL HIGH (ref 0–40)
Albumin/Globulin Ratio: 2.3 (ref 1.1–2.5)
Albumin: 5 g/dL — ABNORMAL HIGH (ref 3.5–4.8)
Alkaline Phosphatase: 67 IU/L (ref 39–117)
BUN/Creatinine Ratio: 19 (ref 10–22)
BUN: 18 mg/dL (ref 8–27)
CO2: 27 mmol/L (ref 18–29)
Calcium: 10.1 mg/dL (ref 8.6–10.2)
Chloride: 102 mmol/L (ref 97–108)
Creatinine, Ser: 0.95 mg/dL (ref 0.76–1.27)
GFR calc Af Amer: 91 mL/min/{1.73_m2} (ref >59)
GFR calc non Af Amer: 79 mL/min/{1.73_m2} (ref >59)
Globulin, Total: 2.2 g/dL (ref 1.5–4.5)
Glucose: 97 mg/dL (ref 65–99)
Potassium: 5.7 mmol/L — ABNORMAL HIGH (ref 3.5–5.2)
Sodium: 143 mmol/L (ref 134–144)
Total Bilirubin: 0.7 mg/dL (ref 0.0–1.2)
Total Protein: 7.2 g/dL (ref 6.0–8.5)

## 2013-04-28 LAB — TSH: TSH: 3.12 u[IU]/mL (ref 0.450–4.500)

## 2013-04-28 NOTE — Progress Notes (Signed)
Quick Note:  Call Patient Labs abnormal: Potassium is elevated The liver enzymes are a little better but the lipids are still abnormal  Recommendations: Need to repeat the potassium ASAP. Aggressive diet and exercise to improve the fatty liver. Fatty liver is now the main non- alcoholic cause of cirrhosis in the [place]   ______

## 2013-05-04 ENCOUNTER — Telehealth: Payer: Self-pay | Admitting: Family Medicine

## 2013-05-04 NOTE — Telephone Encounter (Signed)
Pt.notified

## 2013-05-05 ENCOUNTER — Other Ambulatory Visit (INDEPENDENT_AMBULATORY_CARE_PROVIDER_SITE_OTHER): Payer: Medicare Other

## 2013-05-05 DIAGNOSIS — R899 Unspecified abnormal finding in specimens from other organs, systems and tissues: Secondary | ICD-10-CM

## 2013-05-05 DIAGNOSIS — R6889 Other general symptoms and signs: Secondary | ICD-10-CM

## 2013-05-05 NOTE — Progress Notes (Signed)
Pt here for labs only. 

## 2013-05-06 ENCOUNTER — Other Ambulatory Visit: Payer: Self-pay | Admitting: Neurosurgery

## 2013-05-06 DIAGNOSIS — I714 Abdominal aortic aneurysm, without rupture: Secondary | ICD-10-CM

## 2013-05-06 DIAGNOSIS — Z48812 Encounter for surgical aftercare following surgery on the circulatory system: Secondary | ICD-10-CM

## 2013-05-06 LAB — BMP8+EGFR
BUN/Creatinine Ratio: 16 (ref 10–22)
BUN: 17 mg/dL (ref 8–27)
CO2: 25 mmol/L (ref 18–29)
Calcium: 10 mg/dL (ref 8.6–10.2)
Chloride: 101 mmol/L (ref 97–108)
Creatinine, Ser: 1.06 mg/dL (ref 0.76–1.27)
GFR calc Af Amer: 80 mL/min/{1.73_m2} (ref >59)
GFR calc non Af Amer: 69 mL/min/{1.73_m2} (ref >59)
Glucose: 99 mg/dL (ref 65–99)
Potassium: 5.4 mmol/L — ABNORMAL HIGH (ref 3.5–5.2)
Sodium: 142 mmol/L (ref 134–144)

## 2013-05-18 ENCOUNTER — Ambulatory Visit (INDEPENDENT_AMBULATORY_CARE_PROVIDER_SITE_OTHER): Payer: Medicare Other | Admitting: Family Medicine

## 2013-05-18 ENCOUNTER — Ambulatory Visit: Payer: Medicare Other | Admitting: Family Medicine

## 2013-05-18 ENCOUNTER — Encounter: Payer: Self-pay | Admitting: Family Medicine

## 2013-05-18 VITALS — BP 133/72 | HR 66 | Temp 97.1°F | Ht 71.0 in | Wt 185.8 lb

## 2013-05-18 DIAGNOSIS — I6529 Occlusion and stenosis of unspecified carotid artery: Secondary | ICD-10-CM

## 2013-05-18 DIAGNOSIS — K824 Cholesterolosis of gallbladder: Secondary | ICD-10-CM

## 2013-05-18 DIAGNOSIS — R748 Abnormal levels of other serum enzymes: Secondary | ICD-10-CM

## 2013-05-18 DIAGNOSIS — E875 Hyperkalemia: Secondary | ICD-10-CM

## 2013-05-18 DIAGNOSIS — Z8679 Personal history of other diseases of the circulatory system: Secondary | ICD-10-CM

## 2013-05-18 DIAGNOSIS — E669 Obesity, unspecified: Secondary | ICD-10-CM

## 2013-05-18 DIAGNOSIS — Z6826 Body mass index (BMI) 26.0-26.9, adult: Secondary | ICD-10-CM | POA: Insufficient documentation

## 2013-05-18 DIAGNOSIS — E039 Hypothyroidism, unspecified: Secondary | ICD-10-CM

## 2013-05-18 DIAGNOSIS — K7689 Other specified diseases of liver: Secondary | ICD-10-CM

## 2013-05-18 DIAGNOSIS — E785 Hyperlipidemia, unspecified: Secondary | ICD-10-CM

## 2013-05-18 DIAGNOSIS — Z9889 Other specified postprocedural states: Secondary | ICD-10-CM

## 2013-05-18 DIAGNOSIS — K760 Fatty (change of) liver, not elsewhere classified: Secondary | ICD-10-CM

## 2013-05-18 DIAGNOSIS — R7402 Elevation of levels of lactic acid dehydrogenase (LDH): Secondary | ICD-10-CM

## 2013-05-18 DIAGNOSIS — I10 Essential (primary) hypertension: Secondary | ICD-10-CM

## 2013-05-18 NOTE — Progress Notes (Signed)
SUBJECTIVE: CC: Chief Complaint  Patient presents with  . Follow-up    follow up chronic problems  needs k+ reck see labs     HPI: Here for follow up of Hyperkalemia and elevated transaminases due to fatty liver. He has been changing his diet to improve his health status.  Past Medical History  Diagnosis Date  . Arthritis   . Hyperlipidemia   . Hypertension   . Joint pain   . Weight gain   . AAA (abdominal aortic aneurysm)   . 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 Atherosclerotic peripheral vascular disease   . AAA (abdominal aortic aneurysm)   . COPD (chronic obstructive pulmonary disease)   . Thyroid disease     hypothyroid  . Bilateral carotid artery stenosis    Past Surgical History  Procedure Laterality Date  . Abdominal aortic aneurysm repair    . Back surgery    . Hydrocele excision / repair     History   Social History  . Marital Status: Married    Spouse Name: N/A    Number of Children: N/A  . Years of Education: N/A   Occupational History  . Not on file.   Social History Main Topics  . Smoking status: Former Smoker -- 1.00 packs/day    Types: Cigarettes    Quit date: 10/27/2005  . Smokeless tobacco: Not on file  . Alcohol Use: No  . Drug Use: No  . Sexual Activity:    Other Topics Concern  . Not on file   Social History Narrative  . No narrative on file   Family History  Problem Relation Age of Onset  . Heart disease Father    Current Outpatient Prescriptions on File Prior to Visit  Medication Sig Dispense Refill  . amLODipine (NORVASC) 5 MG tablet TAKE ONE TABLET BY MOUTH ONE TIME DAILY  30 tablet  11  . aspirin 325 MG EC tablet Take 325 mg by mouth daily.        Marland Kitchen atorvastatin (LIPITOR) 40 MG tablet TAKE ONE TABLET BY MOUTH ONE TIME DAILY  30 tablet  11  . cholecalciferol (VITAMIN D) 1000 UNITS tablet Take 1,000 Units by mouth daily.        Marland Kitchen levothyroxine (SYNTHROID, LEVOTHROID) 75 MCG tablet Take 1 tablet (75 mcg  total) by mouth daily.  30 tablet  11  . lisinopril (PRINIVIL,ZESTRIL) 40 MG tablet Take 1 tablet (40 mg total) by mouth daily.  30 tablet  11  . Multiple Vitamin (ONE-A-DAY MENS PO) Take 1 tablet by mouth daily.        . Omega-3 Fatty Acids (FISH OIL) 1200 MG CAPS Take 1 capsule by mouth daily.       No current facility-administered medications on file prior to visit.   No Known Allergies Immunization History  Administered Date(s) Administered  . Influenza Split 03/23/2013  . Pneumococcal Conjugate-13 04/26/2013   Prior to Admission medications   Medication Sig Start Date End Date Taking? Authorizing Provider  amLODipine (NORVASC) 5 MG tablet TAKE ONE TABLET BY MOUTH ONE TIME DAILY 01/14/13   Ileana Ladd, MD  aspirin 325 MG EC tablet Take 325 mg by mouth daily.      Historical Provider, MD  atorvastatin (LIPITOR) 40 MG tablet TAKE ONE TABLET BY MOUTH ONE TIME DAILY 01/14/13   Ileana Ladd, MD  cholecalciferol (VITAMIN D) 1000 UNITS tablet Take 1,000 Units by mouth daily.  Historical Provider, MD  levothyroxine (SYNTHROID, LEVOTHROID) 75 MCG tablet Take 1 tablet (75 mcg total) by mouth daily. 01/14/13   Ileana Ladd, MD  lisinopril (PRINIVIL,ZESTRIL) 40 MG tablet Take 1 tablet (40 mg total) by mouth daily. 01/14/13   Ileana Ladd, MD  Multiple Vitamin (ONE-A-DAY MENS PO) Take 1 tablet by mouth daily.      Historical Provider, MD  Omega-3 Fatty Acids (FISH OIL) 1200 MG CAPS Take 1 capsule by mouth daily.    Historical Provider, MD     ROS: As above in the HPI. All other systems are stable or negative.  OBJECTIVE: APPEARANCE:  Patient in no acute distress.The patient appeared well nourished and normally developed. Acyanotic. Waist:40.25 inches VITAL SIGNS:BP 133/72  Pulse 66  Temp(Src) 97.1 F (36.2 C) (Oral)  Ht 5\' 11"  (1.803 m)  Wt 185 lb 12.8 oz (84.278 kg)  BMI 25.93 kg/m2   WM with central obesity.  SKIN: warm and  Dry without overt rashes, tattoos and  scars  HEAD and Neck: without JVD, Head and scalp: normal Eyes:No scleral icterus. Fundi normal, eye movements normal. Ears: Auricle normal, canal normal, Tympanic membranes normal, insufflation normal. Nose: normal Throat: normal Neck & thyroid: normal  CHEST & LUNGS: Chest wall: normal Lungs: Clear  CVS: Reveals the PMI to be normally located. Regular rhythm, First and Second Heart sounds are normal,  absence of murmurs, rubs or gallops. Peripheral vasculature: Radial pulses: normal Dorsal pedis pulses: normal Posterior pulses: normal  ABDOMEN:  Appearance:Obese Benign, no organomegaly, no masses, no Abdominal Aortic enlargement. No Guarding , no rebound. No Bruits. Bowel sounds: normal  RECTAL: N/A GU: N/A  EXTREMETIES: nonedematous.  MUSCULOSKELETAL:  Spine: normal Joints: intact  NEUROLOGIC: oriented to time,place and person; nonfocal. Strength is normal Sensory is normal Reflexes are normal Cranial Nerves are normal.  ASSESSMENT:  Abnormal transaminases - Plan: CMP14+EGFR  Gallbladder polyp  Fatty liver - Plan: CMP14+EGFR  HLD (hyperlipidemia)  HTN (hypertension)  Occlusion and stenosis of carotid artery without mention of cerebral infarction, unspecified laterality  S/P AAA repair  Unspecified hypothyroidism  Hyperkalemia - Plan: CMP14+EGFR  PLAN:  Continue to progress into a full plant based diet is encouraged. Exercise on a regular basis.  Orders Placed This Encounter  Procedures  . CMP14+EGFR   No orders of the defined types were placed in this encounter.   There are no discontinued medications. Return in about 3 months (around 08/18/2013) for Recheck medical problems.  Beatryce Colombo P. [place] Charon, M.D.

## 2013-05-19 LAB — CMP14+EGFR
ALT: 58 IU/L — ABNORMAL HIGH (ref 0–44)
AST: 44 IU/L — ABNORMAL HIGH (ref 0–40)
Albumin/Globulin Ratio: 2.1 (ref 1.1–2.5)
Albumin: 4.6 g/dL (ref 3.5–4.8)
Alkaline Phosphatase: 59 IU/L (ref 39–117)
BUN/Creatinine Ratio: 19 (ref 10–22)
BUN: 18 mg/dL (ref 8–27)
CO2: 23 mmol/L (ref 18–29)
Calcium: 9.5 mg/dL (ref 8.6–10.2)
Chloride: 102 mmol/L (ref 97–108)
Creatinine, Ser: 0.93 mg/dL (ref 0.76–1.27)
GFR calc Af Amer: 94 mL/min/{1.73_m2} (ref >59)
GFR calc non Af Amer: 81 mL/min/{1.73_m2} (ref >59)
Globulin, Total: 2.2 g/dL (ref 1.5–4.5)
Glucose: 99 mg/dL (ref 65–99)
Potassium: 4.9 mmol/L (ref 3.5–5.2)
Sodium: 141 mmol/L (ref 134–144)
Total Bilirubin: 0.7 mg/dL (ref 0.0–1.2)
Total Protein: 6.8 g/dL (ref 6.0–8.5)

## 2013-05-20 ENCOUNTER — Ambulatory Visit: Payer: Medicare Other | Admitting: Family Medicine

## 2013-05-25 ENCOUNTER — Telehealth: Payer: Self-pay | Admitting: *Deleted

## 2013-05-25 NOTE — Telephone Encounter (Signed)
Pt notified of labs

## 2013-05-25 NOTE — Telephone Encounter (Signed)
Message copied by Baltazar Apo on Wed May 25, 2013 11:55 AM ------      Message from: Ileana Ladd      Created: Sat May 21, 2013 10:20 PM       Labs results are stable.the liver enzymes are a bit better and the potassium was normal.      No changes in medications or recommendations. ------

## 2013-06-06 ENCOUNTER — Other Ambulatory Visit: Payer: Medicare Other

## 2013-06-06 ENCOUNTER — Ambulatory Visit: Payer: Medicare Other | Admitting: Neurosurgery

## 2013-06-09 ENCOUNTER — Encounter: Payer: Self-pay | Admitting: Family

## 2013-06-10 ENCOUNTER — Ambulatory Visit (INDEPENDENT_AMBULATORY_CARE_PROVIDER_SITE_OTHER)
Admission: RE | Admit: 2013-06-10 | Discharge: 2013-06-10 | Disposition: A | Discharge status: Home / Self Care | Payer: Medicare Other | Source: Ambulatory Visit | Attending: Neurosurgery | Admitting: Neurosurgery

## 2013-06-10 ENCOUNTER — Encounter: Payer: Self-pay | Admitting: Family

## 2013-06-10 ENCOUNTER — Ambulatory Visit (INDEPENDENT_AMBULATORY_CARE_PROVIDER_SITE_OTHER): Payer: Medicare Other | Admitting: Family

## 2013-06-10 ENCOUNTER — Ambulatory Visit (HOSPITAL_COMMUNITY)
Admission: RE | Admit: 2013-06-10 | Discharge: 2013-06-10 | Disposition: A | Discharge status: Home / Self Care | Payer: Medicare Other | Source: Ambulatory Visit | Attending: Family | Admitting: Family

## 2013-06-10 VITALS — BP 136/76 | HR 69 | Resp 16 | Ht 71.0 in | Wt 187.0 lb

## 2013-06-10 DIAGNOSIS — I6529 Occlusion and stenosis of unspecified carotid artery: Secondary | ICD-10-CM

## 2013-06-10 DIAGNOSIS — I714 Abdominal aortic aneurysm, without rupture, unspecified: Secondary | ICD-10-CM | POA: Insufficient documentation

## 2013-06-10 DIAGNOSIS — Z9889 Other specified postprocedural states: Secondary | ICD-10-CM

## 2013-06-10 DIAGNOSIS — Z8679 Personal history of other diseases of the circulatory system: Secondary | ICD-10-CM

## 2013-06-10 DIAGNOSIS — Z48812 Encounter for surgical aftercare following surgery on the circulatory system: Secondary | ICD-10-CM | POA: Insufficient documentation

## 2013-06-10 NOTE — Patient Instructions (Addendum)
Abdominal Aortic Aneurysm An aneurysm is a weakened or damaged part of an artery wall that bulges from the normal force of blood pumping through the body. An abdominal aortic aneurysm is an aneurysm that occurs in the lower part of the aorta, the main artery of the body.  The major concern with an abdominal aortic aneurysm is that it can enlarge and burst (rupture) or blood can flow between the layers of the wall of the aorta through a tear (aorticdissection). Both of these conditions can cause bleeding inside the body and can be life threatening unless diagnosed and treated promptly. CAUSES  The exact cause of an abdominal aortic aneurysm is unknown. Some contributing factors are:   A hardening of the arteries caused by the buildup of fat and other substances in the lining of a blood vessel (arteriosclerosis).  Inflammation of the walls of an artery (arteritis).   Connective tissue diseases, such as Marfan syndrome.   Abdominal trauma.   An infection, such as syphilis or staphylococcus, in the wall of the aorta (infectious aortitis) caused by bacteria. RISK FACTORS  Risk factors that contribute to an abdominal aortic aneurysm may include:  Age older than 60 years.   High blood pressure (hypertension).  Male gender.  Ethnicity (white race).  Obesity.  Family history of aneurysm (first degree relatives only).  Tobacco use. PREVENTION  The following healthy lifestyle habits may help decrease your risk of abdominal aortic aneurysm:  Quitting smoking. Smoking can raise your blood pressure and cause arteriosclerosis.  Limiting or avoiding alcohol.  Keeping your blood pressure, blood sugar level, and cholesterol levels within normal limits.  Decreasing your salt intake. In somepeople, too much salt can raise blood pressure and increase your risk of abdominal aortic aneurysm.  Eating a diet low in saturated fats and cholesterol.  Increasing your fiber intake by including  whole grains, vegetables, and fruits in your diet. Eating these foods may help lower blood pressure.  Maintaining a healthy weight.  Staying physically active and exercising regularly. SYMPTOMS  The symptoms of abdominal aortic aneurysm may vary depending on the size and rate of growth of the aneurysm.Most grow slowly and do not have any symptoms. When symptoms do occur, they may include:  Pain (abdomen, side, lower back, or groin). The pain may vary in intensity. A sudden onset of severe pain may indicate that the aneurysm has ruptured.  Feeling full after eating only small amounts of food.  Nausea or vomiting or both.  Feeling a pulsating lump in the abdomen.  Feeling faint or passing out. DIAGNOSIS  Since most unruptured abdominal aortic aneurysms have no symptoms, they are often discovered during diagnostic exams for other conditions. An aneurysm may be found during the following procedures:  Ultrasonography (A one-time screening for abdominal aortic aneurysm by ultrasonography is also recommended for all men aged 65-75 years who have ever smoked).  X-ray exams.  A computed tomography (CT).  Magnetic resonance imaging (MRI).  Angiography or arteriography. TREATMENT  Treatment of an abdominal aortic aneurysm depends on the size of your aneurysm, your age, and risk factors for rupture. Medication to control blood pressure and pain may be used to manage aneurysms smaller than 6 cm. Regular monitoring for enlargement may be recommended by your caregiver if:  The aneurysm is 3 4 cm in size (an annual ultrasonography may be recommended).  The aneurysm is 4 4.5 cm in size (an ultrasonography every 6 months may be recommended).  The aneurysm is larger than 4.5   cm in size (your caregiver may ask that you be examined by a vascular surgeon). If your aneurysm is larger than 6 cm, surgical repair may be recommended. There are two main methods for repair of an aneurysm:   Endovascular  repair (a minimally invasive surgery). This is done most often.  Open repair. This method is used if an endovascular repair is not possible. Document Released: 03/19/2005 Document Revised: 10/04/2012 Document Reviewed: 07/09/2012 ExitCare Patient Information 2014 [employer].   Stroke Prevention Some medical conditions and behaviors are associated with an increased chance of having a stroke. You may prevent a stroke by making healthy choices and managing medical conditions. Reduce your risk of having a stroke by:  Staying physically active. Get at least 30 minutes of activity on most or all days.  Not smoking. It may also be helpful to avoid exposure to secondhand smoke.  Limiting alcohol use. Moderate alcohol use is considered to be:  No more than 2 drinks per day for men.  No more than 1 drink per day for nonpregnant women.  Eating healthy foods.  Include 5 or more servings of fruits and vegetables a day.  Certain diets may be prescribed to address high blood pressure, high cholesterol, diabetes, or obesity.  Managing your cholesterol levels.  A low-saturated fat, low-trans fat, low-cholesterol, and high-fiber diet may control cholesterol levels.  Take any prescribed medicines to control cholesterol as directed by your caregiver.  Managing your diabetes.  A controlled-carbohydrate, controlled-sugar diet is recommended to manage diabetes.  Take any prescribed medicines to control diabetes as directed by your caregiver.  Controlling your high blood pressure (hypertension).  A low-salt (sodium), low-saturated fat, low-trans fat, and low-cholesterol diet is recommended to manage high blood pressure.  Take any prescribed medicines to control hypertension as directed by your caregiver.  Maintaining a healthy weight.  A reduced-calorie, low-sodium, low-saturated fat, low-trans fat, low-cholesterol diet is recommended to manage weight.  Stopping drug abuse.  Avoiding  birth control pills.  Talk to your caregiver about the risks of taking birth control pills if you are over 35 years old, smoke, get migraines, or have ever had a blood clot.  Getting evaluated for sleep disorders (sleep apnea).  Talk to your caregiver about getting a sleep evaluation if you snore a lot or have excessive sleepiness.  Taking medicines as directed by your caregiver.  For some people, aspirin or blood thinners (anticoagulants) are helpful in reducing the risk of forming abnormal blood clots that can lead to stroke. If you have the irregular heart rhythm of atrial fibrillation, you should be on a blood thinner unless there is a good reason you cannot take them.  Understand all your medicine instructions. SEEK IMMEDIATE MEDICAL CARE IF:   You have sudden weakness or numbness of the face, arm, or leg, especially on one side of the body.  You have sudden confusion.  You have trouble speaking (aphasia) or understanding.  You have sudden trouble seeing in one or both eyes.  You have sudden trouble walking.  You have dizziness.  You have a loss of balance or coordination.  You have a sudden, severe headache with no known cause.  You have new chest pain or an irregular heartbeat. Any of these symptoms may represent a serious problem that is an emergency. Do not wait to see if the symptoms will go away. Get medical help right away. Call your local emergency services (911 in [place] Do not drive yourself to the hospital. Document   Released: 07/17/2004 Document Revised: 09/01/2011 Document Reviewed: 12/10/2012 ExitCare Patient Information 2014 [employer].  

## 2013-06-10 NOTE — Progress Notes (Signed)
[facility] [place]  Established [facility]  History of Present Illness  [name] [name] is a 72 y.o. ([birth year]) male patient of Dr. Myra Gianotti status post aortic stent graft repair in February 2011 who presents for routine follow up.  The patient has not had back or abdominal pain. He does have mild lumbar disc issues with a history of lumbar spine surgery. He denies claudication symptoms, denies non-healing wounds other than recently addressed skin cancer on his shoulder which has healed well. He is regularly physically active and watches his diet.  He denies any history of stroke or TIA symptoms.  Pt Diabetic: No Pt smoker: former smoker, quit 2007   Past Medical History  Diagnosis Date  . Arthritis   . Hyperlipidemia   . Hypertension   . Joint pain   . Weight gain   . AAA (abdominal aortic aneurysm)   . Cancer 2006 and Nov. 9, 2013     [facility], right shoulder  . Atherosclerotic peripheral vascular disease   . AAA (abdominal aortic aneurysm)   . COPD (chronic obstructive pulmonary disease)   . Thyroid disease     hypothyroid  . Bilateral carotid artery stenosis    Past Surgical History  Procedure Laterality Date  . Back surgery    . Hydrocele excision / repair    . Abdominal aortic aneurysm repair  08-09-09  . Spine surgery     Social History History  Substance Use Topics  . Smoking status: Former Smoker -- 1.00 packs/day    Types: Cigarettes    Quit date: 10/27/2005  . Smokeless tobacco: Not on file  . Alcohol Use: No   Family History Family History  Problem Relation Age of Onset  . Heart disease Father    Current Outpatient Prescriptions on File Prior to Visit  Medication Sig Dispense Refill  . amLODipine (NORVASC) 5 MG tablet TAKE ONE TABLET BY MOUTH ONE TIME DAILY  30 tablet  11  . aspirin 325 MG EC tablet Take 325 mg by mouth daily.        Marland Kitchen atorvastatin (LIPITOR) 40 MG tablet TAKE ONE TABLET BY MOUTH ONE TIME DAILY  30 tablet  11  .  cholecalciferol (VITAMIN D) 1000 UNITS tablet Take 1,000 Units by mouth daily.        Marland Kitchen levothyroxine (SYNTHROID, LEVOTHROID) 75 MCG tablet Take 1 tablet (75 mcg total) by mouth daily.  30 tablet  11  . lisinopril (PRINIVIL,ZESTRIL) 40 MG tablet Take 1 tablet (40 mg total) by mouth daily.  30 tablet  11  . Multiple Vitamin (ONE-A-DAY MENS PO) Take 1 tablet by mouth daily.        . Omega-3 Fatty Acids (FISH OIL) 1200 MG CAPS Take 1 capsule by mouth daily.       No current facility-administered medications on file prior to visit.   No Known Allergies   ROS: See HPI for pertinent positives and negatives.  Physical Examination  Filed Vitals:   06/10/13 0955  BP: 136/76  Pulse: 69  Resp: 16   Filed Weights   06/10/13 0955  Weight: 187 lb (84.823 kg)   Body mass index is 26.09 kg/(m^2).  General: A&O x 3, WD.  Pulmonary: Sym exp, good air movt, CTAB, no rales, rhonchi, & wheezing.  Cardiac: RRR, Nl S1, S2, positive aortic Murmur.  Vascular: Vessel Right Left  Radial 3+Palpable 3+Palpable  Carotid Cardiac murmur transmitted Cardiac murmur transmitted  Aorta Not palpable N/A  Femoral Not Palpable  Not Palpable  Popliteal Not palpable Not palpable  PT Not palpable Not Palpable  DP Palpable Palpable   Gastrointestinal: soft, NTND, -G/R, - HSM, - masses, - CVAT B.  Musculoskeletal: M/S 5/5 throughout, Extremities without ischemic changes.  Neurologic: Pain and light touch intact in extremities, Motor exam as listed above  Non-Invasive Vascular Imaging  EVAR Duplex (Date: 06/10/2013)  AAA sac size: 3.5 cm x 3.2 cm  no endoleak detected  CTA Abd/Pelvis Duplex (Date:06/01/11), (05/31/12 AAA Duplex could not accurately obtain measurements due to overlying bowel gas)  AAA sac size: 4.13 cm x 4.06 cm  no endoleak detected  Medical Decision Making  [name] [name] is a 72 y.o. male who presents s/p EVAR.  Pt is asymptomatic with decreasing sac size.  I discussed with  the patient the importance of surveillance of the endograft.  The next endograft duplex will be scheduled for 12 months, also carotid artery Duplex in 12 months.  The patient will follow up with [place] in 12 months with these studies.  I emphasized the importance of maximal medical management including strict control of blood pressure, blood glucose, and lipid levels, antiplatelet agents, obtaining regular exercise, and continued cessation of smoking.   The patient was given information about AAA including signs, symptoms, treatment, and how to minimize the risk of enlargement and rupture of aneurysms.    Thank you for allowing [place] to participate in this patient's care.  Charisse March, RN, MSN, FNP-C [facility] [place] [facility] Office: [phone]  Clinic Physician: Imogene Burn  06/10/2013, 9:49 AM

## 2013-06-13 ENCOUNTER — Other Ambulatory Visit (HOSPITAL_COMMUNITY): Payer: Medicare Other

## 2013-06-13 ENCOUNTER — Ambulatory Visit: Payer: Medicare Other | Admitting: Family

## 2013-06-13 ENCOUNTER — Encounter (HOSPITAL_COMMUNITY): Payer: Medicare Other

## 2013-08-01 ENCOUNTER — Encounter: Payer: Self-pay | Admitting: *Deleted

## 2013-08-22 ENCOUNTER — Encounter: Payer: Self-pay | Admitting: Family Medicine

## 2013-08-22 ENCOUNTER — Ambulatory Visit (INDEPENDENT_AMBULATORY_CARE_PROVIDER_SITE_OTHER): Payer: Medicare Other | Admitting: Family Medicine

## 2013-08-22 VITALS — BP 144/76 | HR 72 | Temp 97.7°F | Ht 71.0 in | Wt 188.0 lb

## 2013-08-22 DIAGNOSIS — E559 Vitamin D deficiency, unspecified: Secondary | ICD-10-CM | POA: Insufficient documentation

## 2013-08-22 DIAGNOSIS — I6529 Occlusion and stenosis of unspecified carotid artery: Secondary | ICD-10-CM

## 2013-08-22 DIAGNOSIS — K760 Fatty (change of) liver, not elsewhere classified: Secondary | ICD-10-CM

## 2013-08-22 DIAGNOSIS — R7401 Elevation of levels of liver transaminase levels: Secondary | ICD-10-CM

## 2013-08-22 DIAGNOSIS — K7689 Other specified diseases of liver: Secondary | ICD-10-CM

## 2013-08-22 DIAGNOSIS — E785 Hyperlipidemia, unspecified: Secondary | ICD-10-CM

## 2013-08-22 DIAGNOSIS — R7402 Elevation of levels of lactic acid dehydrogenase (LDH): Secondary | ICD-10-CM

## 2013-08-22 DIAGNOSIS — R740 Nonspecific elevation of levels of transaminase and lactic acid dehydrogenase [LDH]: Secondary | ICD-10-CM

## 2013-08-22 DIAGNOSIS — K824 Cholesterolosis of gallbladder: Secondary | ICD-10-CM

## 2013-08-22 DIAGNOSIS — R748 Abnormal levels of other serum enzymes: Secondary | ICD-10-CM

## 2013-08-22 DIAGNOSIS — E039 Hypothyroidism, unspecified: Secondary | ICD-10-CM

## 2013-08-22 DIAGNOSIS — Z8679 Personal history of other diseases of the circulatory system: Secondary | ICD-10-CM

## 2013-08-22 DIAGNOSIS — E669 Obesity, unspecified: Secondary | ICD-10-CM

## 2013-08-22 DIAGNOSIS — Z9889 Other specified postprocedural states: Secondary | ICD-10-CM

## 2013-08-22 DIAGNOSIS — I10 Essential (primary) hypertension: Secondary | ICD-10-CM

## 2013-08-22 NOTE — Progress Notes (Signed)
Patient ID: [name] [name], male   DOB: 12-10-1940, 73 y.o.   MRN: [id number] SUBJECTIVE: CC: Chief Complaint  Patient presents with  . 4 month recheck    HPI:  Patient is here for follow up of hyperlipidemia/HTN/CAD/AAA/hypothyroidism/vit d def: denies Headache;denies Chest Pain;denies weakness;denies Shortness of Breath and orthopnea;denies Visual changes;denies palpitations;denies cough;denies pedal edema;denies symptoms of TIA or stroke;deniesClaudication symptoms. admits to Compliance with medications; denies Problems with medications.  feels good, saw cardiologist in December, everything is  Stable. Doing well.   Past Medical History  Diagnosis Date  . Arthritis   . Hyperlipidemia   . Hypertension   . Joint pain   . Weight gain   . AAA (abdominal aortic aneurysm)   . 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 Atherosclerotic peripheral vascular disease   . AAA (abdominal aortic aneurysm)   . COPD (chronic obstructive pulmonary disease)   . Thyroid disease     hypothyroid  . Bilateral carotid artery stenosis   . Vitamin D deficiency    Past Surgical History  Procedure Laterality Date  . Back surgery    . Hydrocele excision / repair    . Abdominal aortic aneurysm repair  08-09-09  . Spine surgery     History   Social History  . Marital Status: Married    Spouse Name: N/A    Number of Children: N/A  . Years of Education: N/A   Occupational History  . Not on file.   Social History Main Topics  . Smoking status: Former Smoker -- 1.00 packs/day    Types: Cigarettes    Quit date: 10/27/2005  . Smokeless tobacco: Never Used  . Alcohol Use: No  . Drug Use: No  . Sexual Activity: Not on file   Other Topics Concern  . Not on file   Social History Narrative  . No narrative on file   Family History  Problem Relation Age of Onset  . Heart disease Father     Heart Disease before age 60  . Alcohol abuse Father    Current Outpatient  Prescriptions on File Prior to Visit  Medication Sig Dispense Refill  . amLODipine (NORVASC) 5 MG tablet TAKE ONE TABLET BY MOUTH ONE TIME DAILY  30 tablet  11  . aspirin 325 MG EC tablet Take 325 mg by mouth daily.        Marland Kitchen atorvastatin (LIPITOR) 40 MG tablet TAKE ONE TABLET BY MOUTH ONE TIME DAILY  30 tablet  11  . cholecalciferol (VITAMIN D) 1000 UNITS tablet Take 1,000 Units by mouth daily.        Marland Kitchen levothyroxine (SYNTHROID, LEVOTHROID) 75 MCG tablet Take 1 tablet (75 mcg total) by mouth daily.  30 tablet  11  . lisinopril (PRINIVIL,ZESTRIL) 40 MG tablet Take 1 tablet (40 mg total) by mouth daily.  30 tablet  11  . Multiple Vitamin (ONE-A-DAY MENS PO) Take 1 tablet by mouth daily.        . Omega-3 Fatty Acids (FISH OIL) 1200 MG CAPS Take 1 capsule by mouth daily.       No current facility-administered medications on file prior to visit.   No Known Allergies Immunization History  Administered Date(s) Administered  . Influenza Split 03/23/2013  . Pneumococcal Conjugate-13 04/26/2013   Prior to Admission medications   Medication Sig Start Date End Date Taking? Authorizing Provider  amLODipine (NORVASC) 5 MG tablet TAKE ONE TABLET BY MOUTH ONE TIME  DAILY 01/14/13  Yes Ileana Ladd, MD  aspirin 325 MG EC tablet Take 325 mg by mouth daily.     Yes Historical Provider, MD  atorvastatin (LIPITOR) 40 MG tablet TAKE ONE TABLET BY MOUTH ONE TIME DAILY 01/14/13  Yes Ileana Ladd, MD  cholecalciferol (VITAMIN D) 1000 UNITS tablet Take 1,000 Units by mouth daily.     Yes Historical Provider, MD  levothyroxine (SYNTHROID, LEVOTHROID) 75 MCG tablet Take 1 tablet (75 mcg total) by mouth daily. 01/14/13  Yes Ileana Ladd, MD  lisinopril (PRINIVIL,ZESTRIL) 40 MG tablet Take 1 tablet (40 mg total) by mouth daily. 01/14/13  Yes Ileana Ladd, MD  Multiple Vitamin (ONE-A-DAY MENS PO) Take 1 tablet by mouth daily.     Yes Historical Provider, MD  Omega-3 Fatty Acids (FISH OIL) 1200 MG CAPS Take 1 capsule  by mouth daily.   Yes Historical Provider, MD     ROS: As above in the HPI. All other systems are stable or negative.  OBJECTIVE: APPEARANCE:  Patient in no acute distress.The patient appeared well nourished and normally developed. Acyanotic. Waist: VITAL SIGNS:BP 144/76  Pulse 72  Temp(Src) 97.7 F (36.5 C) (Oral)  Ht 5\' 11"  (1.803 m)  Wt 188 lb (85.276 kg)  BMI 26.23 kg/m2  WM looking well SKIN: warm and  Dry without overt rashes, tattoos and scars  HEAD and Neck: without JVD, Head and scalp: normal Eyes:No scleral icterus. Fundi normal, eye movements normal. Ears: Auricle normal, canal normal, Tympanic membranes normal, insufflation normal. Nose: normal Throat: normal Neck & thyroid: normal  CHEST & LUNGS: Chest wall: normal Lungs: Clear  CVS: Reveals the PMI to be normally located. Regular rhythm, First and Second Heart sounds are normal,  2/6 systolic  Murmur unchanged. rubs or gallops. Peripheral vasculature: Radial pulses: normal Dorsal pedis pulses: normal Posterior pulses: normal  ABDOMEN:  Appearance: normal Benign, no organomegaly, no masses, no Abdominal Aortic enlargement. No Guarding , no rebound. No Bruits. Bowel sounds: normal  RECTAL: N/A GU: N/A  EXTREMETIES: nonedematous.  MUSCULOSKELETAL:  Spine: normal Joints: intact  NEUROLOGIC: oriented to time,place and person; nonfocal. Strength is normal Sensory is normal Reflexes are normal Cranial Nerves are normal. Results for orders placed in visit on 05/18/13  CMP14+EGFR      Result Value Ref Range   Glucose 99  65 - 99 mg/dL   BUN 18  8 - 27 mg/dL   Creatinine, Ser 05/20/13  0.76 - 1.27 mg/dL   GFR calc non Af Amer 81  >59 mL/min/1.73   GFR calc Af Amer 94  >59 mL/min/1.73   BUN/Creatinine Ratio 19  10 - 22   Sodium 141  134 - 144 mmol/L   Potassium 4.9  3.5 - 5.2 mmol/L   Chloride 102  97 - 108 mmol/L   CO2 23  18 - 29 mmol/L   Calcium 9.5  8.6 - 10.2 mg/dL   Total Protein 6.8   6.0 - 8.5 g/dL   Albumin 4.6  3.5 - 4.8 g/dL   Globulin, Total 2.2  1.5 - 4.5 g/dL   Albumin/Globulin Ratio 2.1  1.1 - 2.5   Total Bilirubin 0.7  0.0 - 1.2 mg/dL   Alkaline Phosphatase 59  39 - 117 IU/L   AST 44 (*) 0 - 40 IU/L   ALT 58 (*) 0 - 44 IU/L    ASSESSMENT: HLD (hyperlipidemia) - Plan: CMP14+EGFR, NMR, lipoprofile  Obesity  HTN (hypertension) - Plan: CMP14+EGFR  Occlusion  and stenosis of carotid artery without mention of cerebral infarction  Abnormal transaminases  Unspecified hypothyroidism - Plan: TSH  S/P AAA repair  Gallbladder polyp  Fatty liver  Vitamin D deficiency - Plan: Vit D  25 hydroxy (rtn osteoporosis monitoring)  PLAN:      Dr Paula Libra Recommendations  For nutrition information, I recommend books:  1).Eat to Live by Dr Excell Seltzer. 2).Prevent and Reverse Heart Disease by Dr Karl Luke. 3) Dr Janene Harvey Book:  Program to Reverse Diabetes  Exercise recommendations are:  If unable to walk, then the patient can exercise in a chair 3 times a day. By flapping arms like a bird gently and raising legs outwards to the front.  If ambulatory, the patient can go for walks for 30 minutes 3 times a week. Then increase the intensity and duration as tolerated.  Goal is to try to attain exercise frequency to 5 times a week.  If applicable: Best to perform resistance exercises (machines or weights) 2 days a week and cardio type exercises 3 days per week.   No change in medications  Orders Placed This Encounter  Procedures  . CMP14+EGFR  . NMR, lipoprofile  . TSH  . Vit D  25 hydroxy (rtn osteoporosis monitoring)   No orders of the defined types were placed in this encounter.   There are no discontinued medications. Return in about 4 months (around 12/22/2013) for Recheck medical problems.  Rook Maue P. Jacelyn Grip, M.D.

## 2013-08-22 NOTE — Patient Instructions (Signed)
      Dr Layton Tappan's Recommendations  For nutrition information, I recommend books:  1).Eat to Live by Dr Joel Fuhrman. 2).Prevent and Reverse Heart Disease by Dr Caldwell Esselstyn. 3) Dr Neal Barnard's Book:  Program to Reverse Diabetes  Exercise recommendations are:  If unable to walk, then the patient can exercise in a chair 3 times a day. By flapping arms like a bird gently and raising legs outwards to the front.  If ambulatory, the patient can go for walks for 30 minutes 3 times a week. Then increase the intensity and duration as tolerated.  Goal is to try to attain exercise frequency to 5 times a week.  If applicable: Best to perform resistance exercises (machines or weights) 2 days a week and cardio type exercises 3 days per week.  

## 2013-08-24 LAB — CMP14+EGFR
ALT: 91 IU/L — ABNORMAL HIGH (ref 0–44)
AST: 61 IU/L — ABNORMAL HIGH (ref 0–40)
Albumin/Globulin Ratio: 2.4 (ref 1.1–2.5)
Albumin: 4.8 g/dL (ref 3.5–4.8)
Alkaline Phosphatase: 56 IU/L (ref 39–117)
BUN/Creatinine Ratio: 14 (ref 10–22)
BUN: 15 mg/dL (ref 8–27)
CO2: 25 mmol/L (ref 18–29)
Calcium: 10 mg/dL (ref 8.6–10.2)
Chloride: 101 mmol/L (ref 97–108)
Creatinine, Ser: 1.05 mg/dL (ref 0.76–1.27)
GFR calc Af Amer: 81 mL/min/{1.73_m2} (ref >59)
GFR calc non Af Amer: 70 mL/min/{1.73_m2} (ref >59)
Globulin, Total: 2 g/dL (ref 1.5–4.5)
Glucose: 115 mg/dL — ABNORMAL HIGH (ref 65–99)
Potassium: 5 mmol/L (ref 3.5–5.2)
Sodium: 143 mmol/L (ref 134–144)
Total Bilirubin: 0.7 mg/dL (ref 0.0–1.2)
Total Protein: 6.8 g/dL (ref 6.0–8.5)

## 2013-08-24 LAB — NMR, LIPOPROFILE
Cholesterol: 108 mg/dL (ref <200)
HDL Cholesterol by NMR: 29 mg/dL — ABNORMAL LOW (ref >=40)
HDL Particle Number: 23.1 umol/L — ABNORMAL LOW (ref >=30.5)
LDL Particle Number: 1257 nmol/L — ABNORMAL HIGH (ref <1000)
LDL Size: 20.1 nm — ABNORMAL LOW (ref >20.5)
LDLC SERPL CALC-MCNC: 43 mg/dL (ref <100)
LP-IR Score: 69 — ABNORMAL HIGH (ref <=45)
Small LDL Particle Number: 956 nmol/L — ABNORMAL HIGH (ref <=527)
Triglycerides by NMR: 180 mg/dL — ABNORMAL HIGH (ref <150)

## 2013-08-24 LAB — VITAMIN D 25 HYDROXY (VIT D DEFICIENCY, FRACTURES): Vit D, 25-Hydroxy: 28.3 ng/mL — ABNORMAL LOW (ref 30.0–100.0)

## 2013-08-24 LAB — TSH: TSH: 3.5 u[IU]/mL (ref 0.450–4.500)

## 2013-08-24 NOTE — Progress Notes (Signed)
Quick Note:  Call Patient Labs that are abnormal: Liver enzymes are going up again. Sugar is a little elevated The triglycerides are a little elevated The good cholesterol is going down.  Recommendations: Increase the fish oil to 2 capsules twice a day. Cardio exercise 5 days a week at least A plant based Diet is very important!! Avoid juices and simple sugars.!! It will make the fatty liver problem worse.   ______

## 2013-08-26 ENCOUNTER — Ambulatory Visit: Payer: Medicare Other | Admitting: Family Medicine

## 2013-12-26 ENCOUNTER — Ambulatory Visit (INDEPENDENT_AMBULATORY_CARE_PROVIDER_SITE_OTHER): Payer: Medicare Other | Admitting: Nurse Practitioner

## 2013-12-26 ENCOUNTER — Encounter: Payer: Self-pay | Admitting: Nurse Practitioner

## 2013-12-26 VITALS — BP 130/75 | HR 79 | Temp 98.0°F | Ht 71.0 in | Wt 187.8 lb

## 2013-12-26 DIAGNOSIS — I6529 Occlusion and stenosis of unspecified carotid artery: Secondary | ICD-10-CM

## 2013-12-26 DIAGNOSIS — I10 Essential (primary) hypertension: Secondary | ICD-10-CM

## 2013-12-26 DIAGNOSIS — Z125 Encounter for screening for malignant neoplasm of prostate: Secondary | ICD-10-CM

## 2013-12-26 DIAGNOSIS — E559 Vitamin D deficiency, unspecified: Secondary | ICD-10-CM

## 2013-12-26 DIAGNOSIS — E669 Obesity, unspecified: Secondary | ICD-10-CM

## 2013-12-26 DIAGNOSIS — E039 Hypothyroidism, unspecified: Secondary | ICD-10-CM

## 2013-12-26 DIAGNOSIS — E785 Hyperlipidemia, unspecified: Secondary | ICD-10-CM

## 2013-12-26 DIAGNOSIS — R635 Abnormal weight gain: Secondary | ICD-10-CM

## 2013-12-26 MED ORDER — AMLODIPINE BESYLATE 5 MG PO TABS: ORAL_TABLET | ORAL | Status: DC

## 2013-12-26 MED ORDER — LEVOTHYROXINE SODIUM 75 MCG PO TABS: 75.0000 ug | ORAL_TABLET | Freq: Every day | ORAL | Status: DC

## 2013-12-26 MED ORDER — ATORVASTATIN CALCIUM 40 MG PO TABS: ORAL_TABLET | ORAL | Status: DC

## 2013-12-26 MED ORDER — LISINOPRIL 40 MG PO TABS: 40.0000 mg | ORAL_TABLET | Freq: Every day | ORAL | Status: DC

## 2013-12-26 NOTE — Progress Notes (Addendum)
Subjective:    Patient ID: [name] [name], male    DOB: 22-Jul-1940, 73 y.o.   MRN: [id number]  Patient here today for follow up of chronic medical problems. Patient use to see Dr. Jacelyn Grip  Hypertension This is a chronic problem. The current episode started more than 1 year ago. The problem is controlled. Pertinent negatives include no chest pain, headaches, neck pain, palpitations, peripheral edema or shortness of breath. Agents associated with hypertension include thyroid hormones. Risk factors for coronary artery disease include stress, dyslipidemia and male gender. Past treatments include ACE inhibitors and calcium channel blockers. The current treatment provides moderate improvement. There are no compliance problems.  Hypertensive end-organ damage includes a thyroid problem.  Hyperlipidemia This is a chronic problem. The current episode started more than 1 year ago. The problem is uncontrolled. Recent lipid tests were reviewed and are high. Exacerbating diseases include obesity. He has no history of diabetes. Pertinent negatives include no chest pain or shortness of breath. Current antihyperlipidemic treatment includes statins. The current treatment provides moderate improvement of lipids. Compliance problems include adherence to diet and adherence to exercise.  Risk factors for coronary artery disease include hypertension, dyslipidemia and male sex.  Thyroid Problem Visit type: hypothyroidism. Patient reports no palpitations. The symptoms have been stable. His past medical history is significant for hyperlipidemia. There is no history of diabetes.  vitamin D def  Vitamin d OTC daily -no side effects    Review of Systems  Respiratory: Negative for shortness of breath.   Cardiovascular: Negative for chest pain and palpitations.  Musculoskeletal: Negative for neck pain.  Neurological: Negative for headaches.  All other systems reviewed and are negative.      Objective:   Physical Exam    Constitutional: He is oriented to person, place, and time. He appears well-developed and well-nourished.  HENT:  Head: Normocephalic.  Right Ear: External ear normal.  Left Ear: External ear normal.  Nose: Nose normal.  Mouth/Throat: Oropharynx is clear and moist.  Eyes: EOM are normal. Pupils are equal, round, and reactive to light.  Neck: Normal range of motion. Neck supple. No JVD present. No thyromegaly present.  Cardiovascular: Normal rate, regular rhythm and intact distal pulses.  Exam reveals no gallop and no friction rub.   Murmur (3/6 systolic) heard. 3/6 bil carotid bruits  Pulmonary/Chest: Effort normal and breath sounds normal. No respiratory distress. He has no wheezes. He has no rales. He exhibits no tenderness.  Abdominal: Soft. Bowel sounds are normal. He exhibits no mass. There is no tenderness.  Genitourinary: Penis normal.  Refuses digital rectal exam   Musculoskeletal: Normal range of motion. He exhibits no edema.  Lymphadenopathy:    He has no cervical adenopathy.  Neurological: He is alert and oriented to person, place, and time. No cranial nerve deficit.  Skin: Skin is warm and dry.  Psychiatric: He has a normal mood and affect. His behavior is normal. Judgment and thought content normal.   BP 130/75  Pulse 79  Temp(Src) 98 F (36.7 C) (Oral)  Ht 5' 11"  (1.803 m)  Wt 187 lb 12.8 oz (85.186 kg)  BMI 26.20 kg/m2        Assessment & Plan:   1. Vitamin D deficiency   2. Unspecified hypothyroidism   3. Essential hypertension   4. HLD (hyperlipidemia)   5. Occlusion and stenosis of carotid artery without mention of cerebral infarction   6. Obesity   7. Prostate cancer screening    Orders Placed  This Encounter  Procedures  . CMP14+EGFR  . NMR, lipoprofile  . PSA, total and free  . Thyroid Panel With TSH   Meds ordered this encounter  Medications  . atorvastatin (LIPITOR) 40 MG tablet    Sig: TAKE ONE TABLET BY MOUTH ONE TIME DAILY     Dispense:  30 tablet    Refill:  11    Order Specific Question:  Supervising Provider    Answer:  Chipper Herb [[id number]]  . lisinopril (PRINIVIL,ZESTRIL) 40 MG tablet    Sig: Take 1 tablet (40 mg total) by mouth daily.    Dispense:  30 tablet    Refill:  11    NTBS    Order Specific Question:  Supervising Provider    Answer:  Chipper Herb [[id number]]  . amLODipine (NORVASC) 5 MG tablet    Sig: TAKE ONE TABLET BY MOUTH ONE TIME DAILY    Dispense:  30 tablet    Refill:  11    Order Specific Question:  Supervising Provider    Answer:  Chipper Herb [[id number]]  . levothyroxine (SYNTHROID, LEVOTHROID) 75 MCG tablet    Sig: Take 1 tablet (75 mcg total) by mouth daily.    Dispense:  30 tablet    Refill:  11    Order Specific Question:  Supervising Provider    Answer:  Chipper Herb [[id number]]    Labs pending Health maintenance reviewed Diet and exercise encouraged Continue all meds Follow up  In 3 months   [place], FNP

## 2013-12-26 NOTE — Patient Instructions (Signed)

## 2013-12-27 LAB — CMP14+EGFR
ALT: 93 IU/L — AB (ref 0–44)
AST: 68 IU/L — AB (ref 0–40)
Albumin/Globulin Ratio: 2.5 (ref 1.1–2.5)
Albumin: 4.9 g/dL — ABNORMAL HIGH (ref 3.5–4.8)
Alkaline Phosphatase: 57 IU/L (ref 39–117)
BUN/Creatinine Ratio: 14 (ref 10–22)
BUN: 16 mg/dL (ref 8–27)
CALCIUM: 10 mg/dL (ref 8.6–10.2)
CHLORIDE: 99 mmol/L (ref 97–108)
CO2: 25 mmol/L (ref 18–29)
Creatinine, Ser: 1.18 mg/dL (ref 0.76–1.27)
GFR calc Af Amer: 70 mL/min/{1.73_m2} (ref >59)
GFR calc non Af Amer: 60 mL/min/{1.73_m2} (ref >59)
GLUCOSE: 110 mg/dL — AB (ref 65–99)
Globulin, Total: 2 g/dL (ref 1.5–4.5)
POTASSIUM: 5 mmol/L (ref 3.5–5.2)
SODIUM: 141 mmol/L (ref 134–144)
TOTAL PROTEIN: 6.9 g/dL (ref 6.0–8.5)
Total Bilirubin: 0.6 mg/dL (ref 0.0–1.2)

## 2013-12-27 LAB — NMR, LIPOPROFILE
Cholesterol: 122 mg/dL (ref 100–199)
HDL CHOLESTEROL BY NMR: 28 mg/dL — AB (ref >39)
HDL Particle Number: 25.7 umol/L — ABNORMAL LOW (ref >=30.5)
LDL PARTICLE NUMBER: 951 nmol/L (ref <1000)
LDL Size: 19.7 nm (ref >20.5)
LDLC SERPL CALC-MCNC: 57 mg/dL (ref 0–99)
LP-IR Score: 85 — ABNORMAL HIGH (ref <=45)
Small LDL Particle Number: 785 nmol/L — ABNORMAL HIGH (ref <=527)
TRIGLYCERIDES BY NMR: 183 mg/dL — AB (ref 0–149)

## 2013-12-27 LAB — PSA, TOTAL AND FREE
PSA FREE PCT: 27.3 %
PSA FREE: 0.6 ng/mL
PSA: 2.2 ng/mL (ref 0.0–4.0)

## 2013-12-29 ENCOUNTER — Telehealth: Payer: Self-pay | Admitting: Family Medicine

## 2013-12-29 NOTE — Telephone Encounter (Signed)
Message copied by Waverly Ferrari on Thu Dec 29, 2013 10:39 AM ------      Message from: [name]      Created: Thu Dec 29, 2013  8:14 AM       Kidney and liver function stable      Cholesterol looks good except for trig are elevated- need to decrease carbs in diet      PSA normal      Continue current meds- low fat diet and exercise and recheck in 3 months       ------

## 2013-12-29 NOTE — Telephone Encounter (Signed)
Patient aware.

## 2014-01-04 ENCOUNTER — Encounter: Payer: Self-pay | Admitting: Nurse Practitioner

## 2014-01-11 ENCOUNTER — Encounter: Payer: Self-pay | Admitting: Family Medicine

## 2014-01-11 ENCOUNTER — Ambulatory Visit (INDEPENDENT_AMBULATORY_CARE_PROVIDER_SITE_OTHER): Payer: Medicare Other | Admitting: Family Medicine

## 2014-01-11 VITALS — BP 140/73 | HR 73 | Temp 96.9°F | Ht 71.0 in | Wt 190.0 lb

## 2014-01-11 DIAGNOSIS — H65199 Other acute nonsuppurative otitis media, unspecified ear: Secondary | ICD-10-CM

## 2014-01-11 DIAGNOSIS — H65192 Other acute nonsuppurative otitis media, left ear: Secondary | ICD-10-CM

## 2014-01-11 DIAGNOSIS — J329 Chronic sinusitis, unspecified: Secondary | ICD-10-CM

## 2014-01-11 MED ORDER — METHYLPREDNISOLONE ACETATE 80 MG/ML IJ SUSP
80.0000 mg | Freq: Once | INTRAMUSCULAR | Status: AC
  Administered 2014-01-11: 80 mg via INTRAMUSCULAR

## 2014-01-11 MED ORDER — AMOXICILLIN 875 MG PO TABS: 875.0000 mg | ORAL_TABLET | Freq: Two times a day (BID) | ORAL | Status: DC

## 2014-01-11 NOTE — Progress Notes (Signed)
   Subjective:    Patient ID: [name] [name], male    DOB: 02-28-1941, 73 y.o.   MRN: [id number]  HPI C/O facial discomfort and left ear discomfort and decreased hearing acuity   Review of Systems No chest pain, SOB, HA, dizziness, vision change, N/V, diarrhea, constipation, dysuria, urinary urgency or frequency, myalgias, arthralgias or rash.     Objective:   Physical Exam  Vital signs noted  Well developed well nourished male.  HEENT - Head atraumatic Normocephalic                Eyes - PERRLA, Conjuctiva - clear Sclera- Clear EOMI                Ears - EAC AD impacted AS clear AS TM Wnl                            After AS irrigated the EAC is clear and the TM is injected                Throat - oropharanx wnl Respiratory - Lungs CTA bilateral Cardiac - RRR S1 and S2 without murmur GI - Abdomen soft Nontender and bowel sounds active x 4       Assessment & Plan:  Unspecified sinusitis (chronic) - Plan: methylPREDNISolone acetate (DEPO-MEDROL) injection 80 mg, amoxicillin (AMOXIL) 875 MG tablet po bid x 10 days  LOM - Amoxicillin 875mg  one po bid x 10 days  Push po fluids, rest, tylenol and motrin otc prn as directed for fever, arthralgias, and myalgias.  Follow up prn if sx's continue or persist.  Cerumen impaction AS - Irrigate left ear after attempting to manually extract w/o success.  Lysbeth Penner FNP

## 2014-04-13 ENCOUNTER — Encounter: Payer: Self-pay | Admitting: Nurse Practitioner

## 2014-04-13 ENCOUNTER — Ambulatory Visit (INDEPENDENT_AMBULATORY_CARE_PROVIDER_SITE_OTHER): Payer: Medicare Other | Admitting: Nurse Practitioner

## 2014-04-13 VITALS — BP 142/82 | HR 77 | Temp 98.0°F | Ht 71.0 in | Wt 185.6 lb

## 2014-04-13 DIAGNOSIS — E785 Hyperlipidemia, unspecified: Secondary | ICD-10-CM

## 2014-04-13 DIAGNOSIS — E559 Vitamin D deficiency, unspecified: Secondary | ICD-10-CM

## 2014-04-13 DIAGNOSIS — Z8679 Personal history of other diseases of the circulatory system: Secondary | ICD-10-CM

## 2014-04-13 DIAGNOSIS — E038 Other specified hypothyroidism: Secondary | ICD-10-CM

## 2014-04-13 DIAGNOSIS — Z9889 Other specified postprocedural states: Secondary | ICD-10-CM

## 2014-04-13 DIAGNOSIS — I10 Essential (primary) hypertension: Secondary | ICD-10-CM

## 2014-04-13 DIAGNOSIS — E034 Atrophy of thyroid (acquired): Secondary | ICD-10-CM

## 2014-04-13 NOTE — Patient Instructions (Signed)

## 2014-04-13 NOTE — Progress Notes (Signed)
   Subjective:    Patient ID: [name] [name], male    DOB: March 09, 1941, 73 y.o.   MRN: [id number]  Patient here today for follow up of chronic medical problems.  Hypertension This is a chronic problem. The current episode started more than 1 year ago. The problem is controlled. Pertinent negatives include no chest pain, headaches, neck pain, palpitations, peripheral edema or shortness of breath. Agents associated with hypertension include thyroid hormones. Risk factors for coronary artery disease include stress, dyslipidemia and male gender. Past treatments include ACE inhibitors and calcium channel blockers. The current treatment provides moderate improvement. There are no compliance problems.  Hypertensive end-organ damage includes a thyroid problem.  Hyperlipidemia This is a chronic problem. The current episode started more than 1 year ago. The problem is uncontrolled. Recent lipid tests were reviewed and are high. Exacerbating diseases include obesity. He has no history of diabetes. Pertinent negatives include no chest pain or shortness of breath. Current antihyperlipidemic treatment includes statins. The current treatment provides moderate improvement of lipids. Compliance problems include adherence to diet and adherence to exercise.  Risk factors for coronary artery disease include hypertension, dyslipidemia and male sex.  Thyroid Problem Visit type: hypothyroidism. Patient reports no palpitations. The symptoms have been stable. His past medical history is significant for hyperlipidemia. There is no history of diabetes.  vitamin D def  Vitamin d OTC daily -no side effects AAA repair Doing well and sees cardiology every 6 months  Review of Systems  Respiratory: Negative for shortness of breath.   Cardiovascular: Negative for chest pain and palpitations.  Musculoskeletal: Negative for neck pain.  Neurological: Negative for headaches.  All other systems reviewed and are negative.        Objective:   Physical Exam  Constitutional: He is oriented to person, place, and time. He appears well-developed and well-nourished.  HENT:  Head: Normocephalic.  Right Ear: External ear normal.  Left Ear: External ear normal.  Nose: Nose normal.  Mouth/Throat: Oropharynx is clear and moist.  Eyes: EOM are normal. Pupils are equal, round, and reactive to light.  Neck: Normal range of motion. Neck supple. No JVD present. No thyromegaly present.  Cardiovascular: Normal rate, regular rhythm and intact distal pulses.  Exam reveals no gallop and no friction rub.   Murmur (3/6 systolic) heard. 3/6 bil carotid bruits  Pulmonary/Chest: Effort normal and breath sounds normal. No respiratory distress. He has no wheezes. He has no rales. He exhibits no tenderness.  Abdominal: Soft. Bowel sounds are normal. He exhibits no mass. There is no tenderness.  Genitourinary: Penis normal.  Refuses digital rectal exam   Musculoskeletal: Normal range of motion. He exhibits no edema.  Lymphadenopathy:    He has no cervical adenopathy.  Neurological: He is alert and oriented to person, place, and time. No cranial nerve deficit.  Skin: Skin is warm and dry.  Psychiatric: He has a normal mood and affect. His behavior is normal. Judgment and thought content normal.   BP 142/82  Pulse 77  Temp(Src) 98 F (36.7 C) (Oral)  Ht 5\' 11"  (1.803 m)  Wt 185 lb 9.6 oz (84.188 kg)  BMI 25.90 kg/m2        Assessment & Plan:

## 2014-04-14 LAB — NMR, LIPOPROFILE
Cholesterol: 125 mg/dL (ref 100–199)
HDL CHOLESTEROL BY NMR: 32 mg/dL — AB (ref >39)
HDL PARTICLE NUMBER: 24.8 umol/L — AB (ref >=30.5)
LDL PARTICLE NUMBER: 930 nmol/L (ref <1000)
LDL Size: 20.3 nm (ref >20.5)
LDLC SERPL CALC-MCNC: 61 mg/dL (ref 0–99)
LP-IR SCORE: 76 — AB (ref <=45)
Small LDL Particle Number: 559 nmol/L — ABNORMAL HIGH (ref <=527)
Triglycerides by NMR: 160 mg/dL — ABNORMAL HIGH (ref 0–149)

## 2014-04-14 LAB — CMP14+EGFR
ALBUMIN: 4.8 g/dL (ref 3.5–4.8)
ALK PHOS: 51 IU/L (ref 39–117)
ALT: 73 IU/L — ABNORMAL HIGH (ref 0–44)
AST: 56 IU/L — AB (ref 0–40)
Albumin/Globulin Ratio: 2.1 (ref 1.1–2.5)
BUN/Creatinine Ratio: 15 (ref 10–22)
BUN: 17 mg/dL (ref 8–27)
CALCIUM: 10.2 mg/dL (ref 8.6–10.2)
CHLORIDE: 103 mmol/L (ref 97–108)
CO2: 24 mmol/L (ref 18–29)
CREATININE: 1.1 mg/dL (ref 0.76–1.27)
GFR calc Af Amer: 76 mL/min/{1.73_m2} (ref >59)
GFR calc non Af Amer: 66 mL/min/{1.73_m2} (ref >59)
GLOBULIN, TOTAL: 2.3 g/dL (ref 1.5–4.5)
Glucose: 112 mg/dL — ABNORMAL HIGH (ref 65–99)
Potassium: 4.9 mmol/L (ref 3.5–5.2)
SODIUM: 143 mmol/L (ref 134–144)
Total Bilirubin: 0.6 mg/dL (ref 0.0–1.2)
Total Protein: 7.1 g/dL (ref 6.0–8.5)

## 2014-05-15 ENCOUNTER — Other Ambulatory Visit: Payer: Self-pay | Admitting: *Deleted

## 2014-05-15 DIAGNOSIS — I10 Essential (primary) hypertension: Secondary | ICD-10-CM

## 2014-05-15 MED ORDER — AMLODIPINE BESYLATE 5 MG PO TABS: ORAL_TABLET | ORAL | Status: DC

## 2014-06-19 ENCOUNTER — Ambulatory Visit: Payer: Medicare Other | Admitting: Family

## 2014-06-19 ENCOUNTER — Other Ambulatory Visit (HOSPITAL_COMMUNITY): Payer: Medicare Other

## 2014-07-10 ENCOUNTER — Other Ambulatory Visit (HOSPITAL_COMMUNITY): Payer: Medicare Other

## 2014-07-10 ENCOUNTER — Ambulatory Visit: Payer: Medicare Other | Admitting: Family

## 2014-07-20 ENCOUNTER — Encounter: Payer: Self-pay | Admitting: Family

## 2014-07-21 ENCOUNTER — Ambulatory Visit (INDEPENDENT_AMBULATORY_CARE_PROVIDER_SITE_OTHER)
Admission: RE | Admit: 2014-07-21 | Discharge: 2014-07-21 | Disposition: A | Discharge status: Home / Self Care | Payer: Medicare Other | Source: Ambulatory Visit | Attending: Family | Admitting: Family

## 2014-07-21 ENCOUNTER — Encounter: Payer: Self-pay | Admitting: Family

## 2014-07-21 ENCOUNTER — Ambulatory Visit (INDEPENDENT_AMBULATORY_CARE_PROVIDER_SITE_OTHER): Payer: Medicare Other | Admitting: Family

## 2014-07-21 ENCOUNTER — Ambulatory Visit (HOSPITAL_COMMUNITY)
Admission: RE | Admit: 2014-07-21 | Discharge: 2014-07-21 | Disposition: A | Discharge status: Home / Self Care | Payer: Medicare Other | Source: Ambulatory Visit | Attending: Family | Admitting: Family

## 2014-07-21 VITALS — BP 114/71 | HR 77 | Resp 16 | Ht 71.0 in | Wt 183.0 lb

## 2014-07-21 DIAGNOSIS — Z9889 Other specified postprocedural states: Secondary | ICD-10-CM | POA: Diagnosis not present

## 2014-07-21 DIAGNOSIS — I6523 Occlusion and stenosis of bilateral carotid arteries: Secondary | ICD-10-CM | POA: Insufficient documentation

## 2014-07-21 DIAGNOSIS — Z8679 Personal history of other diseases of the circulatory system: Secondary | ICD-10-CM

## 2014-07-21 DIAGNOSIS — I714 Abdominal aortic aneurysm, without rupture: Secondary | ICD-10-CM | POA: Diagnosis not present

## 2014-07-21 DIAGNOSIS — Z95828 Presence of other vascular implants and grafts: Secondary | ICD-10-CM

## 2014-07-21 DIAGNOSIS — Z48812 Encounter for surgical aftercare following surgery on the circulatory system: Secondary | ICD-10-CM | POA: Diagnosis not present

## 2014-07-21 DIAGNOSIS — R0989 Other specified symptoms and signs involving the circulatory and respiratory systems: Secondary | ICD-10-CM

## 2014-07-21 NOTE — Progress Notes (Signed)
Established EVAR Patient   History of Present Illness  [name] [name] is a 74 y.o. male patient of Dr. Trula Slade who is status post aortic stent graft repair in February 2011 and presents for routine follow up.   The patient has not had new back or abdominal pain. He does have mild lumbar disc issues with a history of lumbar spine surgery. He denies claudication symptoms, denies non-healing wounds other than recently addressed skin cancer on his shoulder which has healed well. He is regularly physically active and watches his diet.  He denies any history of stroke or TIA symptoms.  Pt Diabetic: No Pt smoker: former smoker, quit 2007  Pt meds include: Statin : Yes ASA: Yes Other anticoagulants/antiplatelets: no   Past Medical History  Diagnosis Date  . Arthritis   . Hyperlipidemia   . Hypertension   . Joint pain   . Weight gain   . AAA (abdominal aortic aneurysm)   . Cancer 2006 and Nov. 9, 2013     [facility], right shoulder  . Atherosclerotic peripheral vascular disease   . AAA (abdominal aortic aneurysm)   . COPD (chronic obstructive pulmonary disease)   . Thyroid disease     hypothyroid  . Bilateral carotid artery stenosis   . Vitamin D deficiency     Social History History  Substance Use Topics  . Smoking status: Former Smoker -- 1.00 packs/day    Types: Cigarettes    Quit date: 10/27/2005  . Smokeless tobacco: Never Used  . Alcohol Use: No    Family History Family History  Problem Relation Age of Onset  . Heart disease Father     Heart Disease before age 23  . Alcohol abuse Father   . Heart attack Father   . Cancer Mother     Lung  . COPD Mother   . COPD Sister     Surgical History Past Surgical History  Procedure Laterality Date  . Back surgery    . Hydrocele excision / repair    . Abdominal aortic aneurysm repair  08-09-09  . Spine surgery      No Known Allergies  Current Outpatient Prescriptions  Medication Sig Dispense Refill  .  amLODipine (NORVASC) 5 MG tablet TAKE ONE TABLET BY MOUTH ONE TIME DAILY 30 tablet 5  . aspirin 325 MG EC tablet Take 325 mg by mouth daily.      Marland Kitchen atorvastatin (LIPITOR) 40 MG tablet TAKE ONE TABLET BY MOUTH ONE TIME DAILY 30 tablet 11  . cholecalciferol (VITAMIN D) 1000 UNITS tablet Take 1,000 Units by mouth daily.      Marland Kitchen levothyroxine (SYNTHROID, LEVOTHROID) 75 MCG tablet Take 1 tablet (75 mcg total) by mouth daily. 30 tablet 11  . lisinopril (PRINIVIL,ZESTRIL) 40 MG tablet Take 1 tablet (40 mg total) by mouth daily. 30 tablet 11  . Multiple Vitamin (ONE-A-DAY MENS PO) Take 1 tablet by mouth daily.      . Omega-3 Fatty Acids (FISH OIL) 1200 MG CAPS Take 1 capsule by mouth daily.     No current facility-administered medications for this visit.    Review of Systems : See HPI for pertinent positives and negatives.  Physical Examination  Filed Vitals:   07/21/14 0949 07/21/14 0952  BP: 141/73 114/71  Pulse: 77 77  Resp:  16  Height:  5\' 11"  (1.803 m)  Weight:  183 lb (83.008 kg)  SpO2:  96%   Body mass index is 25.53 kg/(m^2).  General: A&O x 3,  WD.  Pulmonary: Sym exp, good air movt, CTAB, no rales, rhonchi, & wheezing.  Cardiac: RRR, Nl S1, S2, positive aortic Murmur.  Vascular: Vessel Right Left  Radial 2+Palpable 2+Palpable  Carotid Cardiac murmur transmitted Cardiac murmur transmitted  Aorta Not palpable N/A  Femoral faintly Palpable Not Palpable  Popliteal Not palpable Not palpable  PT Not palpable, monophasic by Doppler Not Palpable, monophasic by Doppler  DP 1+Palpable Not Palpable, monophasic by Doppler   Gastrointestinal: soft, NTND, -G/R, - HSM, - palpable masses, - CVAT B.  Musculoskeletal: M/S 5/5 throughout, Extremities without ischemic changes.  Neurologic: Pain and light touch intact in extremities, Motor exam as listed above   Non-Invasive Vascular Imaging CAROTID DUPLEX 07/21/2014   CEREBROVASCULAR DUPLEX EVALUATION     INDICATION: Carotid artery disease    PREVIOUS INTERVENTION(S): None    DUPLEX EXAM: Carotid duplex    RIGHT  LEFT  Peak Systolic Velocities (cm/s) End Diastolic Velocities (cm/s) Plaque LOCATION Peak Systolic Velocities (cm/s) End Diastolic Velocities (cm/s) Plaque  101 16 - CCA PROXIMAL 129 25 HT  93 18 - CCA MID 99 14 -  81 22 HT CCA DISTAL 75 16 HT  76 12 HT ECA 64 13 -  144 43 HT ICA PROXIMAL 67 22 HT  108 32 - ICA MID 72 21 -  73 28 - ICA DISTAL 71 23 -    1.5 ICA / CCA Ratio (PSV) .72  Antegrade Vertebral Flow Antegrade  347 Brachial Systolic Pressure (mmHg) 425  Triphasic Brachial Artery Waveforms Triphasic    Plaque Morphology:  HM = Homogeneous, HT = Heterogeneous, CP = Calcific Plaque, SP = Smooth Plaque, IP = Irregular Plaque     ADDITIONAL FINDINGS:     IMPRESSION: 1. 40 - 59% right internal carotid artery disease 2. Less than 40% left internal carotid artery disease    Compared to the previous exam:  No change     ABDOMINAL AORTA DUPLEX EVALUATION - POST ENDOVASCULAR REPAIR (07/21/2014)    INDICATION: Follow up endovascular repair    PREVIOUS INTERVENTION(S): Stent repair of abdominal aortic aneurysm 08/09/2009    DUPLEX EXAM:      DIAMETER AP (cm) DIAMETER TRANSVERSE (cm) VELOCITIES (cm/sec)  Aorta 3.5 3.5 40  Right Common Iliac 1.3 1.0 74  Left Common Iliac 1.2 1.0 42    Comparison Study       Date DIAMETER AP (cm) DIAMETER TRANSVERSE (cm)  06/10/13 3.5 3.2     ADDITIONAL FINDINGS:     IMPRESSION: 1. Technically difficult exam due to bowel gas. 2. Patent stent of the abdominal aorta measuring 3.5 x 3.5 cms    Compared to the previous exam:  No change      Assessment: [name] [name] is a 74 y.o. male who is status post aortic stent graft repair in February 2011. Today's EVAR Duplex reveals a patent stent of the abdominal aorta measuring 3.5 x 3.5 cms. Technically difficult exam due to bowel gas. No change from previous EVAR Duplex  on 06/10/13.  He has no history of stroke or TIA, but does have transmitted cardiac murmur in both carotid arteries. Carotid Duplex today reveals 40 - 59% right internal carotid artery stenosis and less than 40% left internal carotid artery stenosis, no change from previous carotid Duplex.  He has decreased pedal pulses, no claudication symptoms, no lower extremity tissue loss.  Face to face time with patient was 25 minutes. Over 50% of this time was spent on counseling and coordination  of care.   Plan: Follow-up in 1 year with Carotid Duplex, EVAR Duplex, and ABI's.  Encouraged graduated walking program or some form of regular leg exercises that does no aggravate his back issues.   I discussed in depth with the patient the nature of atherosclerosis, and emphasized the importance of maximal medical management including strict control of blood pressure, blood glucose, and lipid levels, obtaining regular exercise, and continued cessation of smoking.  The patient is aware that without maximal medical management the underlying atherosclerotic disease process will progress, limiting the benefit of any interventions. The patient was given information about stroke prevention and what symptoms should prompt the patient to seek immediate medical care. Thank you for allowing [place] to participate in this patient's care.  Clemon Chambers, RN, MSN, FNP-C [facility] Office: [phone]  Clinic Physician: Bridgett Larsson  07/21/2014  10:13 AM

## 2014-07-21 NOTE — Patient Instructions (Signed)
Stroke Prevention Some medical conditions and behaviors are associated with an increased chance of having a stroke. You may prevent a stroke by making healthy choices and managing medical conditions. HOW CAN I REDUCE MY RISK OF HAVING A STROKE?   Stay physically active. Get at least 30 minutes of activity on most or all days.  Do not smoke. It may also be helpful to avoid exposure to secondhand smoke.  Limit alcohol use. Moderate alcohol use is considered to be:  No more than 2 drinks per day for men.  No more than 1 drink per day for nonpregnant women.  Eat healthy foods. This involves:  Eating 5 or more servings of fruits and vegetables a day.  Making dietary changes that address high blood pressure (hypertension), high cholesterol, diabetes, or obesity.  Manage your cholesterol levels.  Making food choices that are high in fiber and low in saturated fat, trans fat, and cholesterol may control cholesterol levels.  Take any prescribed medicines to control cholesterol as directed by your health care provider.  Manage your diabetes.  Controlling your carbohydrate and sugar intake is recommended to manage diabetes.  Take any prescribed medicines to control diabetes as directed by your health care provider.  Control your hypertension.  Making food choices that are low in salt (sodium), saturated fat, trans fat, and cholesterol is recommended to manage hypertension.  Take any prescribed medicines to control hypertension as directed by your health care provider.  Maintain a healthy weight.  Reducing calorie intake and making food choices that are low in sodium, saturated fat, trans fat, and cholesterol are recommended to manage weight.  Stop drug abuse.  Avoid taking birth control pills.  Talk to your health care provider about the risks of taking birth control pills if you are over 35 years old, smoke, get migraines, or have ever had a blood clot.  Get evaluated for sleep  disorders (sleep apnea).  Talk to your health care provider about getting a sleep evaluation if you snore a lot or have excessive sleepiness.  Take medicines only as directed by your health care provider.  For some people, aspirin or blood thinners (anticoagulants) are helpful in reducing the risk of forming abnormal blood clots that can lead to stroke. If you have the irregular heart rhythm of atrial fibrillation, you should be on a blood thinner unless there is a good reason you cannot take them.  Understand all your medicine instructions.  Make sure that other conditions (such as anemia or atherosclerosis) are addressed. SEEK IMMEDIATE MEDICAL CARE IF:   You have sudden weakness or numbness of the face, arm, or leg, especially on one side of the body.  Your face or eyelid droops to one side.  You have sudden confusion.  You have trouble speaking (aphasia) or understanding.  You have sudden trouble seeing in one or both eyes.  You have sudden trouble walking.  You have dizziness.  You have a loss of balance or coordination.  You have a sudden, severe headache with no known cause.  You have new chest pain or an irregular heartbeat. Any of these symptoms may represent a serious problem that is an emergency. Do not wait to see if the symptoms will go away. Get medical help at once. Call your local emergency services (911 in [place] Do not drive yourself to the hospital. Document Released: 07/17/2004 Document Revised: 10/24/2013 Document Reviewed: 12/10/2012 ExitCare Patient Information 2015 [employer]. This information is not intended to replace advice given   to you by your health care provider. Make sure you discuss any questions you have with your health care provider.  

## 2014-08-21 ENCOUNTER — Ambulatory Visit (INDEPENDENT_AMBULATORY_CARE_PROVIDER_SITE_OTHER): Payer: Medicare Other | Admitting: Nurse Practitioner

## 2014-08-21 ENCOUNTER — Ambulatory Visit (INDEPENDENT_AMBULATORY_CARE_PROVIDER_SITE_OTHER): Payer: Medicare Other

## 2014-08-21 ENCOUNTER — Encounter: Payer: Self-pay | Admitting: Nurse Practitioner

## 2014-08-21 VITALS — BP 132/84 | HR 70 | Temp 97.1°F | Ht 71.0 in | Wt 174.0 lb

## 2014-08-21 DIAGNOSIS — J0101 Acute recurrent maxillary sinusitis: Secondary | ICD-10-CM

## 2014-08-21 DIAGNOSIS — I10 Essential (primary) hypertension: Secondary | ICD-10-CM | POA: Diagnosis not present

## 2014-08-21 DIAGNOSIS — Z720 Tobacco use: Secondary | ICD-10-CM

## 2014-08-21 DIAGNOSIS — E034 Atrophy of thyroid (acquired): Secondary | ICD-10-CM

## 2014-08-21 DIAGNOSIS — Z87891 Personal history of nicotine dependence: Secondary | ICD-10-CM

## 2014-08-21 DIAGNOSIS — E038 Other specified hypothyroidism: Secondary | ICD-10-CM | POA: Diagnosis not present

## 2014-08-21 DIAGNOSIS — I4891 Unspecified atrial fibrillation: Secondary | ICD-10-CM

## 2014-08-21 DIAGNOSIS — E559 Vitamin D deficiency, unspecified: Secondary | ICD-10-CM | POA: Diagnosis not present

## 2014-08-21 DIAGNOSIS — E785 Hyperlipidemia, unspecified: Secondary | ICD-10-CM | POA: Diagnosis not present

## 2014-08-21 MED ORDER — AZITHROMYCIN 250 MG PO TABS: ORAL_TABLET | ORAL | Status: DC

## 2014-08-21 NOTE — Patient Instructions (Signed)

## 2014-08-21 NOTE — Progress Notes (Signed)
Subjective:    Patient ID: [name] [name], male    DOB: 1940/08/01, 74 y.o.   MRN: [id number]  Patient here today for follow up of chronic medical problems. Also c/o persistent congestion and sinus pressure for 5 weeks. He states he has taken alka-seltzer plus with some relief.  Hypertension This is a chronic problem. The current episode started more than 1 year ago. The problem has been waxing and waning since onset. The problem is controlled. Pertinent negatives include no blurred vision, chest pain, headaches, neck pain, palpitations or shortness of breath. Agents associated with hypertension include thyroid hormones. Risk factors for coronary artery disease include dyslipidemia, family history, male gender and sedentary lifestyle. Past treatments include calcium channel blockers and ACE inhibitors. Compliance problems include exercise.  Hypertensive end-organ damage includes a thyroid problem.  Hyperlipidemia This is a chronic problem. The current episode started more than 1 year ago. Recent lipid tests were reviewed and are variable. Exacerbating diseases include hypothyroidism. Pertinent negatives include no chest pain or shortness of breath. Current antihyperlipidemic treatment includes statins. Compliance problems include adherence to exercise.  Risk factors for coronary artery disease include dyslipidemia, hypertension, male sex and a sedentary lifestyle.  Thyroid Problem Presents for follow-up visit. Patient reports no anxiety, cold intolerance, dry skin or palpitations. The symptoms have been stable. Past treatments include levothyroxine. His past medical history is significant for hyperlipidemia.  vitamin D def  Vitamin d OTC daily -no side effects AAA repair Doing well and sees cardiology every 6 months  Review of Systems  Constitutional: Negative.   HENT: Positive for congestion and sinus pressure.   Eyes: Positive for discharge and redness. Negative for blurred vision.    Respiratory: Positive for cough (Productive of moderate amount of white milky substance. ). Negative for chest tightness and shortness of breath.   Cardiovascular: Negative for chest pain and palpitations.  Endocrine: Negative for cold intolerance.  Musculoskeletal: Negative for neck pain.  Neurological: Negative for headaches.  Psychiatric/Behavioral: The patient is not nervous/anxious.   All other systems reviewed and are negative.      Objective:   Physical Exam  Constitutional: He is oriented to person, place, and time. He appears well-developed and well-nourished.  HENT:  Head: Normocephalic.  Right Ear: Hearing, tympanic membrane, external ear and ear canal normal.  Left Ear: Hearing, tympanic membrane, external ear and ear canal normal.  Nose: Mucosal edema and rhinorrhea present. Right sinus exhibits maxillary sinus tenderness. Right sinus exhibits no frontal sinus tenderness. Left sinus exhibits maxillary sinus tenderness. Left sinus exhibits no frontal sinus tenderness.  Mouth/Throat: Uvula is midline, oropharynx is clear and moist and mucous membranes are normal.  Eyes: EOM are normal. Pupils are equal, round, and reactive to light.  Neck: Normal range of motion. Neck supple. No JVD present. No thyromegaly present.  Cardiovascular: Normal rate, regular rhythm and intact distal pulses.  Exam reveals no gallop and no friction rub.   Murmur (3/6 systolic) heard. 3/6 bil carotid bruits  Pulmonary/Chest: Effort normal and breath sounds normal. No respiratory distress. He has no wheezes. He has no rales. He exhibits no tenderness.  Abdominal: Soft. Bowel sounds are normal. He exhibits no mass. There is no tenderness.  Genitourinary: Penis normal.  Refuses digital rectal exam   Musculoskeletal: Normal range of motion. He exhibits no edema.  Lymphadenopathy:    He has no cervical adenopathy.  Neurological: He is alert and oriented to person, place, and time. No cranial nerve  deficit.  Skin: Skin is warm and dry.  Psychiatric: He has a normal mood and affect. His behavior is normal. Judgment and thought content normal.   BP 156/82 mmHg  Pulse 70  Temp(Src) 97.1 F (36.2 C) (Oral)  Ht 5' 11"  (1.803 m)  Wt 174 lb (78.926 kg)  BMI 24.28 kg/m2  Chest xray- chronic bronchitic changes-Preliminary reading by Ronnald Collum, FNP  [facility]  EKG- atrial fib-.[name] Hassell Done, FNP  CHF-0 HTN-1 Age>70-1 Diabetes-0 Hx stroke (2)-0 Vascular disease-1 Age >65-1 Sex- male-0 Total- 4      Assessment & Plan:   1. Essential hypertension Do not add salt  To diet - CMP14+EGFR - EKG 12-Lead  2. Hypothyroidism due to acquired atrophy of thyroid  3. Vitamin D deficiency  4. HLD (hyperlipidemia) Low fat diet - NMR, lipoprofile  5. Acute recurrent maxillary sinusitis 1. Take meds as prescribed 2. Use a cool mist humidifier especially during the winter months and when heat has been humid. 3. Use saline nose sprays frequently 4. Saline irrigations of the nose can be very helpful if done frequently.  * 4X daily for 1 week*  * Use of a nettie pot can be helpful with this. Follow directions with this* 5. Drink plenty of fluids 6. Keep thermostat turn down low 7.For any cough or congestion  Use plain Mucinex- regular strength or max strength is fine   * Children- consult with Pharmacist for dosing 8. For fever or aces or pains- take tylenol or ibuprofen appropriate for age and weight.  * for fevers greater than 101 orally you may alternate ibuprofen and tylenol every  3 hours.   - z pak as directed  6. Hx of smoking - DG Chest 2 View; Future  7. Atrial fib- new onset Referral to cardiologist for possible anticoag therapy Continue 325 ASA until then    Labs pending Health maintenance reviewed Diet and exercise encouraged Continue all meds Follow up  In 3 month   [place], FNP

## 2014-09-04 ENCOUNTER — Encounter: Payer: Self-pay | Admitting: Nurse Practitioner

## 2014-09-04 ENCOUNTER — Ambulatory Visit (INDEPENDENT_AMBULATORY_CARE_PROVIDER_SITE_OTHER): Payer: Medicare Other | Admitting: Nurse Practitioner

## 2014-09-04 VITALS — BP 138/80 | HR 80 | Ht 71.0 in | Wt 185.8 lb

## 2014-09-04 DIAGNOSIS — R011 Cardiac murmur, unspecified: Secondary | ICD-10-CM

## 2014-09-04 DIAGNOSIS — I4891 Unspecified atrial fibrillation: Secondary | ICD-10-CM | POA: Diagnosis not present

## 2014-09-04 NOTE — Progress Notes (Signed)
Primary Care Physician: [name], FNP Referring Physician:Same   [name] [name] is a 74 y.o. male with a h/o AAA endovascular repair, non obstructive carotid disease,HTN,COPD, that is here for evaluation of possible new onset afib. He was seen by PCP recently for sinus infection and was found to have irregular rhythm. However, closer review of EKG does show PAC's and P waves are difficult to see in all leads, but are definitely present. EKg today shows also presence of P waves,  NSR with premature atrial complexes.  He denies awareness of irregular heartbeat, does have shortness of breath with activities, unchanged for years. No exertional chest pain. Apparently, had a cardiac work-up prior to aneurysm repair in 2011, but cannot find results in EPIC, apparently by cardiologist in the [place] area.Has st/t wave abnormalities on EKG, but found an EKG at time of AAA repair in 2011 and changes are old. His sinus infection has resolved. He is followed by vascular once a Year and was just reevaluated in January.  Today, he denies symptoms of palpitations, chest pain, shortness of breath, orthopnea, PND, lower extremity edema, dizziness, presyncope, syncope, or neurologic sequela. The patient is tolerating medications without difficulties and is otherwise without complaint today.   Past Medical History  Diagnosis Date  . Arthritis   . Hyperlipidemia   . Hypertension   . Joint pain   . Weight gain   . AAA (abdominal aortic aneurysm)   . Cancer 2006 and Nov. 9, 2013     [facility], right shoulder  . Atherosclerotic peripheral vascular disease   . AAA (abdominal aortic aneurysm)   . COPD (chronic obstructive pulmonary disease)   . Thyroid disease     hypothyroid  . Bilateral carotid artery stenosis   . Vitamin D deficiency    Past Surgical History  Procedure Laterality Date  . Back surgery    . Hydrocele excision / repair    . Abdominal aortic aneurysm repair  08-09-09  . Spine surgery       Current Outpatient Prescriptions  Medication Sig Dispense Refill  . amLODipine (NORVASC) 5 MG tablet TAKE ONE TABLET BY MOUTH ONE TIME DAILY 30 tablet 5  . aspirin 325 MG EC tablet Take 325 mg by mouth daily.      Marland Kitchen atorvastatin (LIPITOR) 40 MG tablet TAKE ONE TABLET BY MOUTH ONE TIME DAILY 30 tablet 11  . cholecalciferol (VITAMIN D) 1000 UNITS tablet Take 1,000 Units by mouth daily.      Marland Kitchen levothyroxine (SYNTHROID, LEVOTHROID) 75 MCG tablet Take 1 tablet (75 mcg total) by mouth daily. 30 tablet 11  . lisinopril (PRINIVIL,ZESTRIL) 40 MG tablet Take 1 tablet (40 mg total) by mouth daily. 30 tablet 11  . Multiple Vitamin (ONE-A-DAY MENS PO) Take 1 tablet by mouth daily.      . Omega-3 Fatty Acids (FISH OIL) 1200 MG CAPS Take 2 capsules by mouth daily.      No current facility-administered medications for this visit.    No Known Allergies  History   Social History  . Marital Status: Married    Spouse Name: N/A  . Number of Children: N/A  . Years of Education: N/A   Occupational History  . Not on file.   Social History Main Topics  . Smoking status: Former Smoker -- 1.00 packs/day    Types: Cigarettes    Quit date: 10/27/2005  . Smokeless tobacco: Never Used  . Alcohol Use: No  . Drug Use: No  . Sexual Activity:  Not on file   Other Topics Concern  . Not on file   Social History Narrative    Family History  Problem Relation Age of Onset  . Heart disease Father     Heart Disease before age 38  . Alcohol abuse Father   . Heart attack Father   . Cancer Mother     Lung  . COPD Mother   . COPD Sister     ROS- All systems are reviewed and negative except as per the HPI above  Physical Exam: Filed Vitals:   09/04/14 0916  BP: 138/80  Pulse: 80  Height: 5\' 11"  (1.803 m)  Weight: 185 lb 12.8 oz (84.278 kg)    GEN- The patient is well appearing, alert and oriented x 3 today.   Head- normocephalic, atraumatic Eyes-  Sclera clear, conjunctiva pink Ears-  hearing intact Oropharynx- clear Neck- supple, no JVP Lymph- no cervical lymphadenopathy Lungs- Clear to ausculation bilaterally, normal work of breathing Heart- Regular rate and rhythm,3/6 sys murmur heard best upper rt sternal border, no rubs or gallops, PMI not laterally displaced. Presence of carotid bruits vrs radiation of aortic murmur. GI- soft, NT, ND, + BS Extremities- no clubbing, cyanosis, or edema MS- no significant deformity or atrophy Skin- no rash or lesion Psych- euthymic mood, full affect Neuro- strength and sensation are intact  EKG- NSR with PAC's, ST/T wave abnormalities, unchanged from EKG 2011. Epic records reviewed.  Assessment and Plan: 1. EKg does show p waves but slightly irregular rhythm, No evidence for afib. No further treatment needed.  2. Systolic murmur consistent with possible aortic stenosis. Echo ordered  3. Repair of AAA, vascular disease Continue f/u with vascular as scheduled.  4. HTN Well managed today.  F/u will be determined by results of echo.

## 2014-09-04 NOTE — Patient Instructions (Signed)
Your physician has requested that you have an echocardiogram. Echocardiography is a painless test that uses sound waves to create images of your heart. It provides your doctor with information about the size and shape of your heart and how well your heart's chambers and valves are working. This procedure takes approximately one hour. There are no restrictions for this procedure.  We will contact you regarding follow-up once echo results

## 2014-09-05 ENCOUNTER — Ambulatory Visit (HOSPITAL_COMMUNITY): Payer: Medicare Other | Attending: Cardiovascular Disease | Admitting: Radiology

## 2014-09-05 DIAGNOSIS — I4891 Unspecified atrial fibrillation: Secondary | ICD-10-CM

## 2014-09-05 DIAGNOSIS — R011 Cardiac murmur, unspecified: Secondary | ICD-10-CM

## 2014-09-05 NOTE — Progress Notes (Signed)
Echocardiogram performed.  

## 2014-09-06 ENCOUNTER — Telehealth: Payer: Self-pay | Admitting: *Deleted

## 2014-09-06 NOTE — Telephone Encounter (Signed)
Patients wife notified of appointment w dr. Prudy Feeler for 3/30 @ 220pm.

## 2014-09-08 DIAGNOSIS — H40033 Anatomical narrow angle, bilateral: Secondary | ICD-10-CM | POA: Diagnosis not present

## 2014-09-08 DIAGNOSIS — H1013 Acute atopic conjunctivitis, bilateral: Secondary | ICD-10-CM | POA: Diagnosis not present

## 2014-09-19 DIAGNOSIS — H1013 Acute atopic conjunctivitis, bilateral: Secondary | ICD-10-CM | POA: Diagnosis not present

## 2014-09-20 ENCOUNTER — Ambulatory Visit (INDEPENDENT_AMBULATORY_CARE_PROVIDER_SITE_OTHER): Payer: Medicare Other | Admitting: Cardiovascular Disease

## 2014-09-20 ENCOUNTER — Encounter: Payer: Self-pay | Admitting: Cardiovascular Disease

## 2014-09-20 VITALS — BP 161/85 | HR 69 | Ht 71.0 in | Wt 185.0 lb

## 2014-09-20 DIAGNOSIS — I10 Essential (primary) hypertension: Secondary | ICD-10-CM

## 2014-09-20 DIAGNOSIS — I350 Nonrheumatic aortic (valve) stenosis: Secondary | ICD-10-CM

## 2014-09-20 DIAGNOSIS — Z87891 Personal history of nicotine dependence: Secondary | ICD-10-CM

## 2014-09-20 DIAGNOSIS — I739 Peripheral vascular disease, unspecified: Secondary | ICD-10-CM

## 2014-09-20 DIAGNOSIS — E785 Hyperlipidemia, unspecified: Secondary | ICD-10-CM | POA: Diagnosis not present

## 2014-09-20 NOTE — Progress Notes (Signed)
Patient ID: [name] [name], male   DOB: 12-08-1940, 74 y.o.   MRN: [id number]       CARDIOLOGY CONSULT NOTE  Patient ID: [name] [name] MRN: [id number] DOB/AGE: 1940-10-31 74 y.o.  Admit date: (Not on file) Primary Physician [name], FNP  Reason for Consultation: abnormal echocardiogram  HPI: The patient is a 74 year old male with a history of hypertension, hyperlipidemia, nonobstructive carotid artery disease, COPD, AAA s/p endovascular repair, and hypothyroidism. A murmur was recently heard by another provider and echocardiogram was ordered. This demonstrated normal left ventricular systolic function and regional wall motion with severe aortic stenosis. Mean gradient was 41 mmHg.  Chest x-ray on 08/21/14 demonstrated no acute cardiopulmonary disease with mild left base pleural-parenchymal scarring. He had started smoking at the age of 8 and quit in 2007.  He actually feels quite well and remains very active doing automotive work in his garage and doing a lot of yardwork and denies chest pain, shortness of breath, lightheadedness, dizziness, leg swelling, and syncope.  ECG performed on 09/04/14 demonstrated normal sinus rhythm with a diffuse ST segment and T-wave abnormality, with more pronounced T-wave inversions in leads V3 through V6.     No Known Allergies  Current Outpatient Prescriptions  Medication Sig Dispense Refill  . amLODipine (NORVASC) 5 MG tablet TAKE ONE TABLET BY MOUTH ONE TIME DAILY 30 tablet 5  . aspirin 325 MG EC tablet Take 325 mg by mouth daily.      Marland Kitchen atorvastatin (LIPITOR) 40 MG tablet TAKE ONE TABLET BY MOUTH ONE TIME DAILY 30 tablet 11  . cholecalciferol (VITAMIN D) 1000 UNITS tablet Take 1,000 Units by mouth daily.      Marland Kitchen levothyroxine (SYNTHROID, LEVOTHROID) 75 MCG tablet Take 1 tablet (75 mcg total) by mouth daily. 30 tablet 11  . lisinopril (PRINIVIL,ZESTRIL) 40 MG tablet Take 1 tablet (40 mg total) by mouth daily. 30 tablet 11  .  Multiple Vitamin (ONE-A-DAY MENS PO) Take 1 tablet by mouth daily.      . Omega-3 Fatty Acids (FISH OIL) 1200 MG CAPS Take 2 capsules by mouth daily.      No current facility-administered medications for this visit.    Past Medical History  Diagnosis Date  . Arthritis   . Hyperlipidemia   . Hypertension   . Joint pain   . Weight gain   . AAA (abdominal aortic aneurysm)   . Cancer 2006 and Nov. 9, 2013     [facility], right shoulder  . Atherosclerotic peripheral vascular disease   . AAA (abdominal aortic aneurysm)   . COPD (chronic obstructive pulmonary disease)   . Thyroid disease     hypothyroid  . Bilateral carotid artery stenosis   . Vitamin D deficiency     Past Surgical History  Procedure Laterality Date  . Back surgery    . Hydrocele excision / repair    . Abdominal aortic aneurysm repair  08-09-09  . Spine surgery      History   Social History  . Marital Status: Married    Spouse Name: N/A  . Number of Children: N/A  . Years of Education: N/A   Occupational History  . Not on file.   Social History Main Topics  . Smoking status: Former Smoker -- 1.00 packs/day for 51 years    Types: Cigarettes    Start date: 11/25/1954    Quit date: 10/27/2005  . Smokeless tobacco: Never Used  . Alcohol Use: No  . Drug Use: No  .  Sexual Activity: Not on file   Other Topics Concern  . Not on file   Social History Narrative     No family history of premature CAD in 1st degree relatives.  Prior to Admission medications   Medication Sig Start Date End Date Taking? Authorizing Provider  amLODipine (NORVASC) 5 MG tablet TAKE ONE TABLET BY MOUTH ONE TIME DAILY 05/15/14  Yes [name] Hassell Done, FNP  aspirin 325 MG EC tablet Take 325 mg by mouth daily.     Yes Historical Provider, MD  atorvastatin (LIPITOR) 40 MG tablet TAKE ONE TABLET BY MOUTH ONE TIME DAILY 12/26/13  Yes [name] Hassell Done, FNP  cholecalciferol (VITAMIN D) 1000 UNITS tablet Take 1,000 Units by mouth daily.      Yes Historical Provider, MD  levothyroxine (SYNTHROID, LEVOTHROID) 75 MCG tablet Take 1 tablet (75 mcg total) by mouth daily. 12/26/13  Yes [name] Hassell Done, FNP  lisinopril (PRINIVIL,ZESTRIL) 40 MG tablet Take 1 tablet (40 mg total) by mouth daily. 12/26/13  Yes [name] Hassell Done, FNP  Multiple Vitamin (ONE-A-DAY MENS PO) Take 1 tablet by mouth daily.     Yes Historical Provider, MD  Omega-3 Fatty Acids (FISH OIL) 1200 MG CAPS Take 2 capsules by mouth daily.    Yes Historical Provider, MD     Review of systems complete and found to be negative unless listed above in HPI     Physical exam Blood pressure 161/85, pulse 69, height 5\' 11"  (1.803 m), weight 185 lb (83.915 kg), SpO2 97 %. General: NAD Neck: No JVD, no thyromegaly or thyroid nodule.  Lungs: Diminished but clear with normal respiratory effort. CV: Nondisplaced PMI. Regular rate and rhythm, normal S1/diminished S2, no S3/S4, late-peaking IV/VI ejection systolic murmur.  No peripheral edema.  No carotid bruit.  Normal pedal pulses.  Abdomen: Soft, nontender, no hepatosplenomegaly, no distention.  Skin: Intact without lesions or rashes.  Neurologic: Alert and oriented x 3.  Psych: Normal affect. Extremities: No clubbing or cyanosis.  HEENT: Normal.   ECG: Most recent ECG reviewed.  Labs:   Lab Results  Component Value Date   WBC 13.2* 08/10/2009   HGB 11.9* 08/10/2009   HCT 33.9* 08/10/2009   MCV 92.8 08/10/2009   PLT 195 08/10/2009   No results for input(s): NA, K, CL, CO2, BUN, CREATININE, CALCIUM, PROT, BILITOT, ALKPHOS, ALT, AST, GLUCOSE in the last 168 hours.  Invalid input(s): LABALBU No results found for: CKTOTAL, CKMB, CKMBINDEX, TROPONINI  Lab Results  Component Value Date   CHOL 125 04/13/2014   CHOL 122 12/26/2013   CHOL 108 08/22/2013   Lab Results  Component Value Date   HDL 32* 04/13/2014   HDL 28* 12/26/2013   HDL 29* 08/22/2013   Lab Results  Component Value Date   LDLCALC 61  04/13/2014   LDLCALC 57 12/26/2013   LDLCALC 43 08/22/2013   Lab Results  Component Value Date   TRIG 160* 04/13/2014   TRIG 183* 12/26/2013   TRIG 180* 08/22/2013   No results found for: CHOLHDL No results found for: LDLDIRECT       Studies: No results found.  ASSESSMENT AND PLAN:  1. Severe aortic stenosis: I had an extensive discussion with him about the natural history of aortic stenosis. I did inform him of symptoms he would likely experience in the future including chest pain, shortness of breath, lightheadedness, dizziness, leg swelling, fatigue, and possibly syncope. I will repeat an echocardiogram in 6 months and follow-up with him in the office in 3  months. I asked him to call me immediately should he experience any of these symptoms as he will eventually require aortic valve replacement. The patient and his wife are understanding and in agreement with this plan.  2. Essential HTN: Well controlled on amlodipine 5 mg and lisinopril 40 mg. No changes.  3. Hyperlipidemia: On high-intensity statin therapy with Lipitor 40 mg.  4. PVD: Followed by vascular surgery.  Dispo: f/u in office with me in 3 months, repeat echocardiogram in 6 months.  Signed: Kate Sable, M.D., F.A.C.C.  09/20/2014, 2:29 PM

## 2014-09-20 NOTE — Patient Instructions (Signed)
Your physician recommends that you schedule a follow-up appointment in: 3 months with Dr. Bronson Ing  Your physician recommends that you continue on your current medications as directed. Please refer to the Current Medication list given to you today.  Your physician has requested that you have an echocardiogram IN 6 MONTHS. Echocardiography is a painless test that uses sound waves to create images of your heart. It provides your doctor with information about the size and shape of your heart and how well your heart's chambers and valves are working. This procedure takes approximately one hour. There are no restrictions for this procedure.  Thank you for choosing [facility]!!

## 2014-10-11 ENCOUNTER — Other Ambulatory Visit: Payer: Medicare Other

## 2014-12-05 ENCOUNTER — Ambulatory Visit (INDEPENDENT_AMBULATORY_CARE_PROVIDER_SITE_OTHER): Payer: Medicare Other | Admitting: *Deleted

## 2014-12-05 ENCOUNTER — Encounter: Payer: Self-pay | Admitting: *Deleted

## 2014-12-05 VITALS — BP 151/78 | HR 62 | Ht 68.25 in | Wt 190.0 lb

## 2014-12-05 DIAGNOSIS — Z0000 Encounter for general adult medical examination without abnormal findings: Secondary | ICD-10-CM

## 2014-12-05 NOTE — Progress Notes (Signed)
Patient ID: [name] [name], male   DOB: August 28, 1940, 74 y.o.   MRN: [id number]    Subjective:   [name] [name] is a 74 y.o. male who presents for an Initial Medicare Annual Wellness Visit. [name] [name] lives at home with his wife. They had two children but both are deceased. One was an accidental death and the other from cardiovascular complications related to strep infection. They have 4 grandchildren, 2 great granddaughters, and 1 great grandson on the way. He is very active with his grandchildren and plays golf once a week. 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 He is very active in his local church as well. [name] [name] is retired from the [employer] where he worked for 47 years. 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   Review of Systems   Cardiac Risk Factors include: advanced age (>37mn, >>5women);hypertension;male gender;dyslipidemia   Urology- He does complain of some ED related to blood pressure medication.   Other systems negative.     Objective:    Today's Vitals   12/05/14 0924  BP: 151/78  Pulse: 62  Height: 5' 8.25" (1.734 m)  Weight: 190 lb (86.183 kg)    Current Medications (verified) Outpatient Encounter Prescriptions as of 12/05/2014  Medication Sig  . amLODipine (NORVASC) 5 MG tablet TAKE ONE TABLET BY MOUTH ONE TIME DAILY  . aspirin 325 MG EC tablet Take 325 mg by mouth daily.    .Marland Kitchenatorvastatin (LIPITOR) 40 MG tablet TAKE ONE TABLET BY MOUTH ONE TIME DAILY  . cholecalciferol (VITAMIN D) 1000 UNITS tablet Take 1,000 Units by mouth daily.    .Marland Kitchenlevothyroxine (SYNTHROID, LEVOTHROID) 75 MCG tablet Take 1 tablet (75 mcg total) by mouth daily.  .Marland Kitchenlisinopril (PRINIVIL,ZESTRIL) 40 MG tablet Take 1 tablet (40 mg total) by mouth daily.  . Multiple Vitamin (ONE-A-DAY MENS PO) Take 1 tablet by mouth daily.    . Omega-3 Fatty Acids (FISH OIL) 1200 MG CAPS Take 2 capsules  by mouth daily.    No facility-administered encounter medications on file as of 12/05/2014.    Allergies (verified) Review of patient's allergies indicates no known allergies.   History: Past Medical History  Diagnosis Date  . Arthritis   . Hyperlipidemia   . Hypertension   . Joint pain   . Weight gain   . AAA (abdominal aortic aneurysm)   . [facility] 2006 [facility]  . Atherosclerotic peripheral vascular disease   . AAA (abdominal aortic aneurysm)   . COPD (chronic obstructive pulmonary disease)   . Thyroid disease     hypothyroid  . Bilateral carotid artery stenosis   . Vitamin D deficiency   . Skin cancer    Past Surgical History  Procedure Laterality Date  . Back surgery    . Hydrocele excision / repair    . Abdominal aortic aneurysm repair  08-09-09  . Spine surgery    . Skin cancer excision     Family History  Problem Relation Age of Onset  . Heart disease Father     Heart Disease before age 75 . Alcohol abuse Father   . Heart attack Father   . Cancer Mother     Lung  . COPD Mother   . COPD Sister   . Lupus Sister   . Hypertension Brother   .  Gout Brother   . Heart attack Son   . Gout Brother    Social History   Occupational History  . 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 Smoking status: Former Smoker -- 1.00 packs/day for 51 years    Types: Cigarettes    Start date: 11/25/1954    Quit date: 10/27/2005  . Smokeless tobacco: Never Used  . Alcohol Use: No     Comment: none since 1999  . Drug Use: No  . Sexual Activity: Yes    Activities of Daily Living In your present state of health, do you have any difficulty performing the following activities: 12/05/2014 04/13/2014  Hearing? N N  Vision? N N  Difficulty concentrating or making decisions? N N  Walking or climbing stairs? N N  Dressing or bathing? N N  Doing errands, shopping? N N  Preparing Food and eating ? N -    Using the Toilet? N -  In the past six months, have you accidently leaked urine? N -  Do you have problems with loss of bowel control? N -  Managing your Medications? N -  Managing your Finances? N -  Housekeeping or managing your Housekeeping? N -    Immunizations and Health Maintenance Immunization History  Administered Date(s) Administered  . Influenza Split 03/23/2013  . Influenza, High Dose Seasonal PF 03/06/2014  . Pneumococcal Conjugate-13 04/26/2013  . Pneumococcal Polysaccharide-23 06/23/2004  . Td 03/24/2007   Health Maintenance Due  Topic Date Due  . PNA vac Low Risk Adult (2 of 2 - PPSV23) 04/26/2014  Patient has had both PPSV23 and Prevnar.  Patient Care Team: Herminio Commons, MD as PCP - General (Cardiology) [name] Hassell Done, FNP as Nurse Practitioner (Nurse Practitioner) Allyn Kenner, MD (Dermatology) Adelina Mings Margret Chance, MD as Referring Physician (Optometry) Suella Broad, MD as Consulting Physician (Orthopedic Surgery) Serafina Mitchell, MD as Consulting Physician (Vascular Surgery)   Assessment:   This is a routine wellness examination for [name] [name].   Hearing/Vision screen No hearing or vision deficit noted during visit today.  Dietary issues and exercise activities discussed:   Balanced diet with lean proteins, fruits, and vegetables.  Goals    . Exercise 3x per week (30 min per time)      Depression Screen PHQ 2/9 Scores 12/05/2014 08/21/2014 04/13/2014 12/26/2013  PHQ - 2 Score 0 2 0 0  PHQ- 9 Score - 4 - -   Patient stated that he doesn't have time to be depressed. He keeps himself busy.  Fall Risk Fall Risk  12/05/2014 08/21/2014 04/13/2014 12/26/2013  Falls in the past year? No No No No    Cognitive Function: MMSE - Mini Mental State Exam 12/05/2014  Orientation to time 5  Orientation to Place 5  Registration 3  Attention/ Calculation 5  Recall 2  Language- name 2 objects 2  Language- repeat 1  Language- follow 3 step command 3   Language- read & follow direction 1  Write a sentence 1  Copy design 1  Total score 29  No deficit noted  Screening Tests Health Maintenance  Topic Date Due  . PNA vac Low Risk Adult (2 of 2 - PPSV23) 04/26/2014  . INFLUENZA VACCINE  01/22/2015  . COLON CANCER SCREENING ANNUAL FOBT  10/05/2015  . TETANUS/TDAP  03/23/2017  . COLONOSCOPY  02/25/2023  . ZOSTAVAX  Completed        Plan:  Keep follow up  appointment with specialists and [name], [place]. Continue to stay physically active and active in community activities.   During the course of the visit [name] [name] was educated and counseled about the following appropriate screening and preventive services:   Vaccines to include Pneumoccal-up to date, Influenza-suggested annually, Tdap-due 2018, Zostavax-done 02/24/13  Electrocardiogram-Done 09/04/14  Colorectal cancer screening-Colonoscopy done 02/24/13. Patient reports neg FOBT through [facility].  Diabetes screening-Glucose monitored at routine office visits  Glaucoma screening-Eye exam approx 2 months ago at [facility] in [place], [place]. Report requested.   Nutrition counseling-Balanced diet with lean proteins, fruits, and vegetables. DASH diet for HTN control.  Prostate cancer screening-PSA due July 2016.   Patient Instructions (the written plan) were given to the patient.   Chong Sicilian, RN  12/05/2014       I have reviewed and agree with the above AWV documentation.  Claretta Fraise, M.D.

## 2014-12-05 NOTE — Patient Instructions (Signed)
Eat a balanced diet with lean proteins, fruits, and vegetables. Continue to stay active and try to exercise or walk for 30 minutes 3 times per week.  Keep follow up appointment with [name], [place].   Health Maintenance A healthy lifestyle and preventative care can promote health and wellness.  Maintain regular health, dental, and eye exams.  Eat a healthy diet. Foods like vegetables, fruits, whole grains, low-fat dairy products, and lean protein foods contain the nutrients you need and are low in calories. Decrease your intake of foods high in solid fats, added sugars, and salt. Get information about a proper diet from your health care provider, if necessary.  Regular physical exercise is one of the most important things you can do for your health. Most adults should get at least 150 minutes of moderate-intensity exercise (any activity that increases your heart rate and causes you to sweat) each week. In addition, most adults need muscle-strengthening exercises on 2 or more days a week.   Maintain a healthy weight. The body mass index (BMI) is a screening tool to identify possible weight problems. It provides an estimate of body fat based on height and weight. Your health care provider can find your BMI and can help you achieve or maintain a healthy weight. For males 20 years and older:  A BMI below 18.5 is considered underweight.  A BMI of 18.5 to 24.9 is normal.  A BMI of 25 to 29.9 is considered overweight.  A BMI of 30 and above is considered obese.  Maintain normal blood lipids and cholesterol by exercising and minimizing your intake of saturated fat. Eat a balanced diet with plenty of fruits and vegetables. Blood tests for lipids and cholesterol should begin at age 46 and be repeated every 5 years. If your lipid or cholesterol levels are high, you are over age 72, or you are at high risk for heart disease, you may need your cholesterol levels checked more frequently.Ongoing  high lipid and cholesterol levels should be treated with medicines if diet and exercise are not working.  If you smoke, find out from your health care provider how to quit. If you do not use tobacco, do not start.  Lung cancer screening is recommended for adults aged 68-80 years who are at high risk for developing lung cancer because of a history of smoking. A yearly low-dose CT scan of the lungs is recommended for people who have at least a 30-pack-year history of smoking and are current smokers or have quit within the past 15 years. A pack year of smoking is smoking an average of 1 pack of cigarettes a day for 1 year (for example, a 30-pack-year history of smoking could mean smoking 1 pack a day for 30 years or 2 packs a day for 15 years). Yearly screening should continue until the smoker has stopped smoking for at least 15 years. Yearly screening should be stopped for people who develop a health problem that would prevent them from having lung cancer treatment.  If you choose to drink alcohol, do not have more than 2 drinks per day. One drink is considered to be 12 oz (360 mL) of beer, 5 oz (150 mL) of wine, or 1.5 oz (45 mL) of liquor.  Avoid the use of street drugs. Do not share needles with anyone. Ask for help if you need support or instructions about stopping the use of drugs.  High blood pressure causes heart disease and increases the risk of stroke. Blood  pressure should be checked at least every 1-2 years. Ongoing high blood pressure should be treated with medicines if weight loss and exercise are not effective.  If you are 4-3 years old, ask your health care provider if you should take aspirin to prevent heart disease.  Diabetes screening involves taking a blood sample to check your fasting blood sugar level. This should be done once every 3 years after age 79 if you are at a normal weight and without risk factors for diabetes. Testing should be considered at a younger age or be carried  out more frequently if you are overweight and have at least 1 risk factor for diabetes.  Colorectal cancer can be detected and often prevented. Most routine colorectal cancer screening begins at the age of 30 and continues through age 5. However, your health care provider may recommend screening at an earlier age if you have risk factors for colon cancer. On a yearly basis, your health care provider may provide home test kits to check for hidden blood in the stool. A small camera at the end of a tube may be used to directly examine the colon (sigmoidoscopy or colonoscopy) to detect the earliest forms of colorectal cancer. Talk to your health care provider about this at age 57 when routine screening begins. A direct exam of the colon should be repeated every 5-10 years through age 108, unless early forms of precancerous polyps or small growths are found.  People who are at an increased risk for hepatitis B should be screened for this virus. You are considered at high risk for hepatitis B if:  You were born in a country where hepatitis B occurs often. Talk with your health care provider about which countries are considered high risk.  Your parents were born in a high-risk country and you have not received a shot to protect against hepatitis B (hepatitis B vaccine).  You have HIV or AIDS.  You use needles to inject street drugs.  You live with, or have sex with, someone who has hepatitis B.  You are a man who has sex with other men (MSM).  You get hemodialysis treatment.  You take certain medicines for conditions like cancer, organ transplantation, and autoimmune conditions.  Hepatitis C blood testing is recommended for all people born from [birth year] through 1966 and any individual with known risk factors for hepatitis C.  Healthy men should no longer receive prostate-specific antigen (PSA) blood tests as part of routine cancer screening. Talk to your health care provider about prostate cancer  screening.  Testicular cancer screening is not recommended for adolescents or adult males who have no symptoms. Screening includes self-exam, a health care provider exam, and other screening tests. Consult with your health care provider about any symptoms you have or any concerns you have about testicular cancer.  Practice safe sex. Use condoms and avoid high-risk sexual practices to reduce the spread of sexually transmitted infections (STIs).  You should be screened for STIs, including gonorrhea and chlamydia if:  You are sexually active and are younger than 24 years.  You are older than 24 years, and your health care provider tells you that you are at risk for this type of infection.  Your sexual activity has changed since you were last screened, and you are at an increased risk for chlamydia or gonorrhea. Ask your health care provider if you are at risk.  If you are at risk of being infected with HIV, it is recommended that you take  a prescription medicine daily to prevent HIV infection. This is called pre-exposure prophylaxis (PrEP). You are considered at risk if:  You are a man who has sex with other men (MSM).  You are a heterosexual man who is sexually active with multiple partners.  You take drugs by injection.  You are sexually active with a partner who has HIV.  Talk with your health care provider about whether you are at high risk of being infected with HIV. If you choose to begin PrEP, you should first be tested for HIV. You should then be tested every 3 months for as long as you are taking PrEP.  Use sunscreen. Apply sunscreen liberally and repeatedly throughout the day. You should seek shade when your shadow is shorter than you. Protect yourself by wearing long sleeves, pants, a wide-brimmed hat, and sunglasses year round whenever you are outdoors.  Tell your health care provider of new moles or changes in moles, especially if there is a change in shape or color. Also, tell  your health care provider if a mole is larger than the size of a pencil eraser.  A one-time screening for abdominal aortic aneurysm (AAA) and surgical repair of large AAAs by ultrasound is recommended for men aged 58-75 years who are current or former smokers.  Stay current with your vaccines (immunizations). Document Released: 12/06/2007 Document Revised: 06/14/2013 Document Reviewed: 11/04/2010 [facility] Patient Information 2015 [place], [place]. This information is not intended to replace advice given to you by your health care provider. Make sure you discuss any questions you have with your health care provider.  DASH Eating Plan DASH stands for "Dietary Approaches to Stop Hypertension." The DASH eating plan is a healthy eating plan that has been shown to reduce high blood pressure (hypertension). Additional health benefits may include reducing the risk of type 2 diabetes mellitus, heart disease, and stroke. The DASH eating plan may also help with weight loss. WHAT DO I NEED TO KNOW ABOUT THE DASH EATING PLAN? For the DASH eating plan, you will follow these general guidelines:  Choose foods with a percent daily value for sodium of less than 5% (as listed on the food label).  Use salt-free seasonings or herbs instead of table salt or sea salt.  Check with your health care provider or pharmacist before using salt substitutes.  Eat lower-sodium products, often labeled as "lower sodium" or "no salt added."  Eat fresh foods.  Eat more vegetables, fruits, and low-fat dairy products.  Choose whole grains. Look for the word "whole" as the first word in the ingredient list.  Choose fish and skinless chicken or [place] more often than red meat. Limit fish, poultry, and meat to 6 oz (170 g) each day.  Limit sweets, desserts, sugars, and sugary drinks.  Choose heart-healthy fats.  Limit cheese to 1 oz (28 g) per day.  Eat more home-cooked food and less restaurant, buffet, and fast  food.  Limit fried foods.  Cook foods using methods other than frying.  Limit canned vegetables. If you do use them, rinse them well to decrease the sodium.  When eating at a restaurant, ask that your food be prepared with less salt, or no salt if possible. WHAT FOODS CAN I EAT? Seek help from a dietitian for individual calorie needs. Grains Whole grain or whole wheat bread. Brown rice. Whole grain or whole wheat pasta. Quinoa, bulgur, and whole grain cereals. Low-sodium cereals. Corn or whole wheat flour tortillas. Whole grain cornbread. Whole grain crackers. Low-sodium  crackers. Vegetables Fresh or frozen vegetables (raw, steamed, roasted, or grilled). Low-sodium or reduced-sodium tomato and vegetable juices. Low-sodium or reduced-sodium tomato sauce and paste. Low-sodium or reduced-sodium canned vegetables.  Fruits All fresh, canned (in natural juice), or frozen fruits. Meat and Other Protein Products Ground beef (85% or leaner), grass-fed beef, or beef trimmed of fat. Skinless chicken or [place]. Ground chicken or [place]. Pork trimmed of fat. All fish and seafood. Eggs. Dried beans, peas, or lentils. Unsalted nuts and seeds. Unsalted canned beans. Dairy Low-fat dairy products, such as skim or 1% milk, 2% or reduced-fat cheeses, low-fat ricotta or cottage cheese, or plain low-fat yogurt. Low-sodium or reduced-sodium cheeses. Fats and Oils Tub margarines without trans fats. Light or reduced-fat mayonnaise and salad dressings (reduced sodium). Avocado. Safflower, olive, or canola oils. Natural peanut or almond butter. Other Unsalted popcorn and pretzels. The items listed above may not be a complete list of recommended foods or beverages. Contact your dietitian for more options. WHAT FOODS ARE NOT RECOMMENDED? Grains White bread. White pasta. White rice. Refined cornbread. Bagels and croissants. Crackers that contain trans fat. Vegetables Creamed or fried vegetables. Vegetables in a  cheese sauce. Regular canned vegetables. Regular canned tomato sauce and paste. Regular tomato and vegetable juices. Fruits Dried fruits. Canned fruit in light or heavy syrup. Fruit juice. Meat and Other Protein Products Fatty cuts of meat. Ribs, chicken wings, bacon, sausage, bologna, salami, chitterlings, fatback, hot dogs, bratwurst, and packaged luncheon meats. Salted nuts and seeds. Canned beans with salt. Dairy Whole or 2% milk, cream, half-and-half, and cream cheese. Whole-fat or sweetened yogurt. Full-fat cheeses or blue cheese. Nondairy creamers and whipped toppings. Processed cheese, cheese spreads, or cheese curds. Condiments Onion and garlic salt, seasoned salt, table salt, and sea salt. Canned and packaged gravies. Worcestershire sauce. Tartar sauce. Barbecue sauce. Teriyaki sauce. Soy sauce, including reduced sodium. Steak sauce. Fish sauce. Oyster sauce. Cocktail sauce. Horseradish. Ketchup and mustard. Meat flavorings and tenderizers. Bouillon cubes. Hot sauce. Tabasco sauce. Marinades. Taco seasonings. Relishes. Fats and Oils Butter, stick margarine, lard, shortening, ghee, and bacon fat. Coconut, palm kernel, or palm oils. Regular salad dressings. Other Pickles and olives. Salted popcorn and pretzels. The items listed above may not be a complete list of foods and beverages to avoid. Contact your dietitian for more information. WHERE CAN I FIND MORE INFORMATION? National Heart, Lung, and Blood Institute: travelstabloid.com Document Released: 05/29/2011 Document Revised: 10/24/2013 Document Reviewed: 04/13/2013 [facility] Patient Information 2015 [place], [place]. This information is not intended to replace advice given to you by your health care provider. Make sure you discuss any questions you have with your health care provider.

## 2014-12-11 ENCOUNTER — Ambulatory Visit (INDEPENDENT_AMBULATORY_CARE_PROVIDER_SITE_OTHER): Payer: Medicare Other | Admitting: Nurse Practitioner

## 2014-12-11 ENCOUNTER — Encounter: Payer: Self-pay | Admitting: Cardiovascular Disease

## 2014-12-11 ENCOUNTER — Encounter: Payer: Self-pay | Admitting: Nurse Practitioner

## 2014-12-11 ENCOUNTER — Ambulatory Visit (INDEPENDENT_AMBULATORY_CARE_PROVIDER_SITE_OTHER): Payer: Medicare Other | Admitting: Cardiovascular Disease

## 2014-12-11 VITALS — BP 134/76 | HR 73 | Temp 97.6°F | Ht 68.0 in | Wt 186.0 lb

## 2014-12-11 VITALS — BP 138/70 | HR 76 | Ht 71.0 in | Wt 185.0 lb

## 2014-12-11 DIAGNOSIS — Z9889 Other specified postprocedural states: Secondary | ICD-10-CM

## 2014-12-11 DIAGNOSIS — I739 Peripheral vascular disease, unspecified: Secondary | ICD-10-CM

## 2014-12-11 DIAGNOSIS — E038 Other specified hypothyroidism: Secondary | ICD-10-CM | POA: Diagnosis not present

## 2014-12-11 DIAGNOSIS — E034 Atrophy of thyroid (acquired): Secondary | ICD-10-CM | POA: Diagnosis not present

## 2014-12-11 DIAGNOSIS — E559 Vitamin D deficiency, unspecified: Secondary | ICD-10-CM | POA: Diagnosis not present

## 2014-12-11 DIAGNOSIS — I4891 Unspecified atrial fibrillation: Secondary | ICD-10-CM

## 2014-12-11 DIAGNOSIS — I350 Nonrheumatic aortic (valve) stenosis: Secondary | ICD-10-CM

## 2014-12-11 DIAGNOSIS — I10 Essential (primary) hypertension: Secondary | ICD-10-CM

## 2014-12-11 DIAGNOSIS — Z23 Encounter for immunization: Secondary | ICD-10-CM | POA: Diagnosis not present

## 2014-12-11 DIAGNOSIS — E785 Hyperlipidemia, unspecified: Secondary | ICD-10-CM | POA: Diagnosis not present

## 2014-12-11 DIAGNOSIS — Z8679 Personal history of other diseases of the circulatory system: Secondary | ICD-10-CM

## 2014-12-11 DIAGNOSIS — E039 Hypothyroidism, unspecified: Secondary | ICD-10-CM

## 2014-12-11 MED ORDER — LISINOPRIL 40 MG PO TABS: 40.0000 mg | ORAL_TABLET | Freq: Every day | ORAL | Status: DC

## 2014-12-11 MED ORDER — AMLODIPINE BESYLATE 5 MG PO TABS: ORAL_TABLET | ORAL | Status: DC

## 2014-12-11 MED ORDER — ATORVASTATIN CALCIUM 40 MG PO TABS: ORAL_TABLET | ORAL | Status: DC

## 2014-12-11 MED ORDER — VITAMIN D 1000 UNITS PO TABS: 1000.0000 [IU] | ORAL_TABLET | Freq: Every day | ORAL | Status: AC

## 2014-12-11 MED ORDER — LEVOTHYROXINE SODIUM 75 MCG PO TABS: 75.0000 ug | ORAL_TABLET | Freq: Every day | ORAL | Status: DC

## 2014-12-11 NOTE — Addendum Note (Signed)
Addended by: Rolena Infante on: 12/11/2014 12:23 PM   Modules accepted: Orders

## 2014-12-11 NOTE — Addendum Note (Signed)
Addended by: [name] on: 12/11/2014 09:54 AM   Modules accepted: Level of Service

## 2014-12-11 NOTE — Patient Instructions (Signed)
Your physician recommends that you continue on your current medications as directed. Please refer to the Current Medication list given to you today. Your physician has requested that you have an echocardiogram in 3 months. Echocardiography is a painless test that uses sound waves to create images of your heart. It provides your doctor with information about the size and shape of your heart and how well your heart's chambers and valves are working. This procedure takes approximately one hour. There are no restrictions for this procedure. Your physician recommends that you schedule a follow-up appointment 2 weeks after your echo in September 2016.

## 2014-12-11 NOTE — Addendum Note (Signed)
Addended by: Merlene Laughter on: 12/11/2014 02:37 PM   Modules accepted: Orders, Level of Service

## 2014-12-11 NOTE — Progress Notes (Signed)
Patient ID: [name] [name], male   DOB: 05-May-1941, 74 y.o.   MRN: [id number]      SUBJECTIVE: The patient presents for follow-up of severe stenosis. He also has a history of hypertension, hyperlipidemia, nonobstructive carotid artery disease, COPD, AAA s/p endovascular repair, and hypothyroidism.  He continues to feel well and remains very active. He golfed 18 holes with his wife yesterday, and walked the entire course. He denies chest pain, shortness of breath, lightheadedness, dizziness, leg swelling, and syncope.     Review of Systems: As per "subjective", otherwise negative.  No Known Allergies  Current Outpatient Prescriptions  Medication Sig Dispense Refill  . amLODipine (NORVASC) 5 MG tablet TAKE ONE TABLET BY MOUTH ONE TIME DAILY 30 tablet 5  . aspirin 325 MG EC tablet Take 325 mg by mouth daily.      Marland Kitchen atorvastatin (LIPITOR) 40 MG tablet TAKE ONE TABLET BY MOUTH ONE TIME DAILY 30 tablet 11  . cholecalciferol (VITAMIN D) 1000 UNITS tablet Take 1 tablet (1,000 Units total) by mouth daily. 30 tablet 6  . levothyroxine (SYNTHROID, LEVOTHROID) 75 MCG tablet Take 1 tablet (75 mcg total) by mouth daily. 30 tablet 11  . lisinopril (PRINIVIL,ZESTRIL) 40 MG tablet Take 1 tablet (40 mg total) by mouth daily. 30 tablet 11  . Multiple Vitamin (ONE-A-DAY MENS PO) Take 1 tablet by mouth daily.      . Omega-3 Fatty Acids (FISH OIL) 1200 MG CAPS Take 2 capsules by mouth daily.      No current facility-administered medications for this visit.    Past Medical History  Diagnosis Date  . Arthritis   . Hyperlipidemia   . Hypertension   . Joint pain   . Weight gain   . AAA (abdominal aortic aneurysm)   . Cancer 2006 and Nov. 9, 2013     [facility], right shoulder  . Atherosclerotic peripheral vascular disease   . AAA (abdominal aortic aneurysm)   . COPD (chronic obstructive pulmonary disease)   . Thyroid disease     hypothyroid  . Bilateral carotid artery stenosis   . Vitamin D deficiency    . Skin cancer     Past Surgical History  Procedure Laterality Date  . Back surgery    . Hydrocele excision / repair    . Abdominal aortic aneurysm repair  08-09-09  . Spine surgery    . Skin cancer excision      History   Social History  . Marital Status: Married    Spouse Name: N/A  . Number of Children: N/A  . Years of Education: N/A   Occupational History  . 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 Smoking status: Former Smoker -- 1.00 packs/day for 51 years    Types: Cigarettes    Start date: 11/25/1954    Quit date: 10/27/2005  . Smokeless tobacco: Never Used  . Alcohol Use: No     Comment: none since 1999  . Drug Use: No  . Sexual Activity: Yes   Other Topics Concern  . Not on file   Social History Narrative     Filed Vitals:   12/11/14 1355  BP: 138/70  Pulse: 76  Height: '5\' 11"'$  (1.803 m)  Weight: 185 lb (83.915 kg)    PHYSICAL EXAM General: NAD Neck: No JVD, no thyromegaly or thyroid nodule.  Lungs: Diminished but clear with normal respiratory effort. CV: Nondisplaced PMI. Regular rate and rhythm, normal  S1/diminished S2, no S3/S4, late-peaking IV/VI ejection systolic murmur. No peripheral edema.  Abdomen: Soft, nontender, no distention.  Skin: Intact without lesions or rashes.  Neurologic: Alert and oriented x 3.  Psych: Normal affect. Extremities: No clubbing or cyanosis.  HEENT: Normal.   ECG: Most recent ECG reviewed.      ASSESSMENT AND PLAN: 1. Severe aortic stenosis: Symptomatically stable. I previously had an extensive discussion with him about the natural history of aortic stenosis. I informed him of symptoms he would likely experience in the future including chest pain, shortness of breath, lightheadedness, dizziness, leg swelling, fatigue, and possibly syncope. I will repeat an echocardiogram in 3 months. I asked him to call me immediately should he experience any of these symptoms as  he will eventually require aortic valve replacement. The patient and his wife are understanding and in agreement with this plan.  2. Essential HTN: Well controlled on amlodipine 5 mg and lisinopril 40 mg. No changes.  3. Hyperlipidemia: On high-intensity statin therapy with Lipitor 40 mg.  4. PVD: Followed by vascular surgery.  Dispo: f/u 3 months.  Kate Sable, M.D., F.A.C.C.

## 2014-12-11 NOTE — Patient Instructions (Signed)
Fat and Cholesterol Control Diet Fat and cholesterol levels in your blood and organs are influenced by your diet. High levels of fat and cholesterol may lead to diseases of the heart, small and large blood vessels, gallbladder, liver, and pancreas. CONTROLLING FAT AND CHOLESTEROL WITH DIET Although exercise and lifestyle factors are important, your diet is key. That is because certain foods are known to raise cholesterol and others to lower it. The goal is to balance foods for their effect on cholesterol and more importantly, to replace saturated and trans fat with other types of fat, such as monounsaturated fat, polyunsaturated fat, and omega-3 fatty acids. On average, a person should consume no more than 15 to 17 g of saturated fat daily. Saturated and trans fats are considered "bad" fats, and they will raise LDL cholesterol. Saturated fats are primarily found in animal products such as meats, butter, and cream. However, that does not mean you need to give up all your favorite foods. Today, there are good tasting, low-fat, low-cholesterol substitutes for most of the things you like to eat. Choose low-fat or nonfat alternatives. Choose round or loin cuts of red meat. These types of cuts are lowest in fat and cholesterol. Chicken (without the skin), fish, veal, and ground turkey breast are great choices. Eliminate fatty meats, such as hot dogs and salami. Even shellfish have little or no saturated fat. Have a 3 oz (85 g) portion when you eat lean meat, poultry, or fish. Trans fats are also called "partially hydrogenated oils." They are oils that have been scientifically manipulated so that they are solid at room temperature resulting in a longer shelf life and improved taste and texture of foods in which they are added. Trans fats are found in stick margarine, some tub margarines, cookies, crackers, and baked goods.  When baking and cooking, oils are a great substitute for butter. The monounsaturated oils are  especially beneficial since it is believed they lower LDL and raise HDL. The oils you should avoid entirely are saturated tropical oils, such as coconut and palm.  Remember to eat a lot from food groups that are naturally free of saturated and trans fat, including fish, fruit, vegetables, beans, grains (barley, rice, couscous, bulgur wheat), and pasta (without cream sauces).  IDENTIFYING FOODS THAT LOWER FAT AND CHOLESTEROL  Soluble fiber may lower your cholesterol. This type of fiber is found in fruits such as apples, vegetables such as broccoli, potatoes, and carrots, legumes such as beans, peas, and lentils, and grains such as barley. Foods fortified with plant sterols (phytosterol) may also lower cholesterol. You should eat at least 2 g per day of these foods for a cholesterol lowering effect.  Read package labels to identify low-saturated fats, trans fat free, and low-fat foods at the supermarket. Select cheeses that have only 2 to 3 g saturated fat per ounce. Use a heart-healthy tub margarine that is free of trans fats or partially hydrogenated oil. When buying baked goods (cookies, crackers), avoid partially hydrogenated oils. Breads and muffins should be made from whole grains (whole-wheat or whole oat flour, instead of "flour" or "enriched flour"). Buy non-creamy canned soups with reduced salt and no added fats.  FOOD PREPARATION TECHNIQUES  Never deep-fry. If you must fry, either stir-fry, which uses very little fat, or use non-stick cooking sprays. When possible, broil, bake, or roast meats, and steam vegetables. Instead of putting butter or margarine on vegetables, use lemon and herbs, applesauce, and cinnamon (for squash and sweet potatoes). Use nonfat   yogurt, salsa, and low-fat dressings for salads.  LOW-SATURATED FAT / LOW-FAT FOOD SUBSTITUTES Meats / Saturated Fat (g)  Avoid: Steak, marbled (3 oz/85 g) / 11 g  Choose: Steak, lean (3 oz/85 g) / 4 g  Avoid: Hamburger (3 oz/85 g) / 7  g  Choose: Hamburger, lean (3 oz/85 g) / 5 g  Avoid: Ham (3 oz/85 g) / 6 g  Choose: Ham, lean cut (3 oz/85 g) / 2.4 g  Avoid: Chicken, with skin, dark meat (3 oz/85 g) / 4 g  Choose: Chicken, skin removed, dark meat (3 oz/85 g) / 2 g  Avoid: Chicken, with skin, light meat (3 oz/85 g) / 2.5 g  Choose: Chicken, skin removed, light meat (3 oz/85 g) / 1 g Dairy / Saturated Fat (g)  Avoid: Whole milk (1 cup) / 5 g  Choose: Low-fat milk, 2% (1 cup) / 3 g  Choose: Low-fat milk, 1% (1 cup) / 1.5 g  Choose: Skim milk (1 cup) / 0.3 g  Avoid: Hard cheese (1 oz/28 g) / 6 g  Choose: Skim milk cheese (1 oz/28 g) / 2 to 3 g  Avoid: Cottage cheese, 4% fat (1 cup) / 6.5 g  Choose: Low-fat cottage cheese, 1% fat (1 cup) / 1.5 g  Avoid: Ice cream (1 cup) / 9 g  Choose: Sherbet (1 cup) / 2.5 g  Choose: Nonfat frozen yogurt (1 cup) / 0.3 g  Choose: Frozen fruit bar / trace  Avoid: Whipped cream (1 tbs) / 3.5 g  Choose: Nondairy whipped topping (1 tbs) / 1 g Condiments / Saturated Fat (g)  Avoid: Mayonnaise (1 tbs) / 2 g  Choose: Low-fat mayonnaise (1 tbs) / 1 g  Avoid: Butter (1 tbs) / 7 g  Choose: Extra light margarine (1 tbs) / 1 g  Avoid: Coconut oil (1 tbs) / 11.8 g  Choose: Olive oil (1 tbs) / 1.8 g  Choose: Corn oil (1 tbs) / 1.7 g  Choose: Safflower oil (1 tbs) / 1.2 g  Choose: Sunflower oil (1 tbs) / 1.4 g  Choose: Soybean oil (1 tbs) / 2.4 g  Choose: Canola oil (1 tbs) / 1 g Document Released: 06/09/2005 Document Revised: 10/04/2012 Document Reviewed: 09/07/2013 [employer] Patient Information 2015 [employer]. This information is not intended to replace advice given to you by your health care provider. Make sure you discuss any questions you have with your health care provider.  

## 2014-12-11 NOTE — Progress Notes (Signed)
Subjective:    Patient ID: [name] [name], male    DOB: [birth year], 74 y.o.   MRN: [id number]  Patient here today for follow up of chronic medical problems.  Hypertension This is a chronic problem. The current episode started more than 1 year ago. The problem has been waxing and waning since onset. The problem is controlled. Pertinent negatives include no blurred vision, chest pain, headaches, neck pain, palpitations or shortness of breath. Agents associated with hypertension include thyroid hormones. Risk factors for coronary artery disease include dyslipidemia, family history, male gender and sedentary lifestyle. Past treatments include calcium channel blockers and ACE inhibitors. Compliance problems include exercise.  Hypertensive end-organ damage includes a thyroid problem.  Hyperlipidemia This is a chronic problem. The current episode started more than 1 year ago. Recent lipid tests were reviewed and are variable. Exacerbating diseases include hypothyroidism. Pertinent negatives include no chest pain or shortness of breath. Current antihyperlipidemic treatment includes statins. Compliance problems include adherence to exercise.  Risk factors for coronary artery disease include dyslipidemia, hypertension, male sex and a sedentary lifestyle.  Thyroid Problem Presents for follow-up visit. Patient reports no anxiety, cold intolerance, dry skin or palpitations. The symptoms have been stable. Past treatments include levothyroxine. His past medical history is significant for hyperlipidemia.  vitamin D def  Vitamin d OTC daily -no side effects AAA repair Doing well and sees cardiology every 6 months  Review of Systems  Constitutional: Negative.   HENT: Positive for congestion and sinus pressure.   Eyes: Positive for discharge and redness. Negative for blurred vision.  Respiratory: Positive for cough (Productive of moderate amount of white milky substance. ). Negative for chest tightness and  shortness of breath.   Cardiovascular: Negative for chest pain and palpitations.  Endocrine: Negative for cold intolerance.  Genitourinary:       Denies urinary in continence  Musculoskeletal: Negative for neck pain.  Neurological: Negative for headaches.  Psychiatric/Behavioral: The patient is not nervous/anxious.   All other systems reviewed and are negative.      Objective:   Physical Exam  Constitutional: He is oriented to person, place, and time. He appears well-developed and well-nourished.  HENT:  Head: Normocephalic.  Right Ear: Hearing, tympanic membrane, external ear and ear canal normal.  Left Ear: Hearing, tympanic membrane, external ear and ear canal normal.  Nose: Right sinus exhibits no frontal sinus tenderness. Left sinus exhibits no frontal sinus tenderness.  Mouth/Throat: Uvula is midline, oropharynx is clear and moist and mucous membranes are normal.  Eyes: EOM are normal. Pupils are equal, round, and reactive to light.  Neck: Normal range of motion. Neck supple. No JVD present. No thyromegaly present.  Cardiovascular: Normal rate, regular rhythm and intact distal pulses.  Exam reveals no gallop and no friction rub.   Murmur (3/6 systolic) heard. 3/6 bil carotid bruits  Pulmonary/Chest: Effort normal and breath sounds normal. No respiratory distress. He has no wheezes. He has no rales. He exhibits no tenderness.  Abdominal: Soft. Bowel sounds are normal. He exhibits no mass. There is no tenderness.  Genitourinary: Penis normal.  Refuses digital rectal exam   Musculoskeletal: Normal range of motion. He exhibits no edema.  Lymphadenopathy:    He has no cervical adenopathy.  Neurological: He is alert and oriented to person, place, and time. No cranial nerve deficit.  Skin: Skin is warm and dry.  Psychiatric: He has a normal mood and affect. His behavior is normal. Judgment and thought content normal.   BP  134/76 mmHg  Pulse 73  Temp(Src) 97.6 F (36.4 C)  (Oral)  Ht 5' 8"  (1.727 m)  Wt 186 lb (84.369 kg)  BMI 28.29 kg/m2     Assessment & Plan:   1. Essential hypertension Do not add slat to diet - lisinopril (PRINIVIL,ZESTRIL) 40 MG tablet; Take 1 tablet (40 mg total) by mouth daily.  Dispense: 30 tablet; Refill: 11 - amLODipine (NORVASC) 5 MG tablet; TAKE ONE TABLET BY MOUTH ONE TIME DAILY  Dispense: 30 tablet; Refill: 5 - CMP14+EGFR   2. HLD (hyperlipidemia) Low fat diet - atorvastatin (LIPITOR) 40 MG tablet; TAKE ONE TABLET BY MOUTH ONE TIME DAILY  Dispense: 30 tablet; Refill: 11 - Lipid panel  3. S/P AAA repair  4. Vitamin D deficiency - cholecalciferol (VITAMIN D) 1000 UNITS tablet; Take 1 tablet (1,000 Units total) by mouth daily.  Dispense: 30 tablet; Refill: 6  5. Hypothyroidism, unspecified hypothyroidism type - levothyroxine (SYNTHROID, LEVOTHROID) 75 MCG tablet; Take 1 tablet (75 mcg total) by mouth daily.  Dispense: 30 tablet; Refill: 11    Labs pending Health maintenance reviewed Diet and exercise encouraged Continue all meds Follow up  In 6 months    [place], FNP

## 2014-12-12 LAB — CMP14+EGFR
A/G RATIO: 1.8 (ref 1.1–2.5)
ALT: 100 IU/L — AB (ref 0–44)
AST: 81 IU/L — ABNORMAL HIGH (ref 0–40)
Albumin: 4.6 g/dL (ref 3.5–4.8)
Alkaline Phosphatase: 72 IU/L (ref 39–117)
BUN/Creatinine Ratio: 16 (ref 10–22)
BUN: 17 mg/dL (ref 8–27)
Bilirubin Total: 0.7 mg/dL (ref 0.0–1.2)
CALCIUM: 9.6 mg/dL (ref 8.6–10.2)
CO2: 24 mmol/L (ref 18–29)
Chloride: 102 mmol/L (ref 97–108)
Creatinine, Ser: 1.04 mg/dL (ref 0.76–1.27)
GFR calc Af Amer: 81 mL/min/{1.73_m2} (ref >59)
GFR, EST NON AFRICAN AMERICAN: 70 mL/min/{1.73_m2} (ref >59)
GLUCOSE: 105 mg/dL — AB (ref 65–99)
Globulin, Total: 2.6 g/dL (ref 1.5–4.5)
Potassium: 4.8 mmol/L (ref 3.5–5.2)
Sodium: 142 mmol/L (ref 134–144)
TOTAL PROTEIN: 7.2 g/dL (ref 6.0–8.5)

## 2014-12-12 LAB — LIPID PANEL
Chol/HDL Ratio: 4.3 ratio units (ref 0.0–5.0)
Cholesterol, Total: 113 mg/dL (ref 100–199)
HDL: 26 mg/dL — ABNORMAL LOW (ref >39)
LDL CALC: 52 mg/dL (ref 0–99)
Triglycerides: 176 mg/dL — ABNORMAL HIGH (ref 0–149)
VLDL Cholesterol Cal: 35 mg/dL (ref 5–40)

## 2015-01-10 ENCOUNTER — Other Ambulatory Visit: Payer: Self-pay | Admitting: Cardiovascular Disease

## 2015-01-10 DIAGNOSIS — I350 Nonrheumatic aortic (valve) stenosis: Secondary | ICD-10-CM

## 2015-02-28 ENCOUNTER — Other Ambulatory Visit: Payer: Self-pay

## 2015-02-28 ENCOUNTER — Ambulatory Visit (INDEPENDENT_AMBULATORY_CARE_PROVIDER_SITE_OTHER): Payer: Medicare Other

## 2015-02-28 DIAGNOSIS — I350 Nonrheumatic aortic (valve) stenosis: Secondary | ICD-10-CM

## 2015-03-15 ENCOUNTER — Ambulatory Visit: Payer: Medicare Other | Admitting: Cardiovascular Disease

## 2015-03-16 ENCOUNTER — Ambulatory Visit (INDEPENDENT_AMBULATORY_CARE_PROVIDER_SITE_OTHER): Payer: Medicare Other | Admitting: Nurse Practitioner

## 2015-03-16 ENCOUNTER — Encounter: Payer: Self-pay | Admitting: Nurse Practitioner

## 2015-03-16 VITALS — BP 145/82 | HR 67 | Temp 97.1°F | Ht 71.0 in | Wt 189.0 lb

## 2015-03-16 DIAGNOSIS — Z8679 Personal history of other diseases of the circulatory system: Secondary | ICD-10-CM

## 2015-03-16 DIAGNOSIS — Z9889 Other specified postprocedural states: Secondary | ICD-10-CM | POA: Diagnosis not present

## 2015-03-16 DIAGNOSIS — I10 Essential (primary) hypertension: Secondary | ICD-10-CM | POA: Diagnosis not present

## 2015-03-16 DIAGNOSIS — E038 Other specified hypothyroidism: Secondary | ICD-10-CM

## 2015-03-16 DIAGNOSIS — E785 Hyperlipidemia, unspecified: Secondary | ICD-10-CM

## 2015-03-16 DIAGNOSIS — Z6826 Body mass index (BMI) 26.0-26.9, adult: Secondary | ICD-10-CM

## 2015-03-16 DIAGNOSIS — Z125 Encounter for screening for malignant neoplasm of prostate: Secondary | ICD-10-CM

## 2015-03-16 DIAGNOSIS — E559 Vitamin D deficiency, unspecified: Secondary | ICD-10-CM | POA: Diagnosis not present

## 2015-03-16 DIAGNOSIS — E034 Atrophy of thyroid (acquired): Secondary | ICD-10-CM | POA: Diagnosis not present

## 2015-03-16 MED ORDER — AMLODIPINE BESYLATE 5 MG PO TABS: ORAL_TABLET | ORAL | Status: DC

## 2015-03-16 NOTE — Patient Instructions (Signed)
Health Maintenance A healthy lifestyle and preventative care can promote health and wellness.  Maintain regular health, dental, and eye exams.  Eat a healthy diet. Foods like vegetables, fruits, whole grains, low-fat dairy products, and lean protein foods contain the nutrients you need and are low in calories. Decrease your intake of foods high in solid fats, added sugars, and salt. Get information about a proper diet from your health care [name] [name], if necessary.  Regular physical exercise is one of the most important things you can do for your health. Most adults should get at least 150 minutes of moderate-intensity exercise (any activity that increases your heart rate and causes you to sweat) each week. In addition, most adults need muscle-strengthening exercises on 2 or more days a week.   Maintain a healthy weight. The body mass index (BMI) is a screening tool to identify possible weight problems. It provides an estimate of body fat based on height and weight. Your health care [name] [name] can find your BMI and can help you achieve or maintain a healthy weight. For males 20 years and older:  A BMI below 18.5 is considered underweight.  A BMI of 18.5 to 24.9 is normal.  A BMI of 25 to 29.9 is considered overweight.  A BMI of 30 and above is considered obese.  Maintain normal blood lipids and cholesterol by exercising and minimizing your intake of saturated fat. Eat a balanced diet with plenty of fruits and vegetables. Blood tests for lipids and cholesterol should begin at age 20 and be repeated every 5 years. If your lipid or cholesterol levels are high, you are over age 50, or you are at high risk for heart disease, you may need your cholesterol levels checked more frequently.Ongoing high lipid and cholesterol levels should be treated with medicines if diet and exercise are not working.  If you smoke, find out from your health care [name] [name] how to quit. If you do not use tobacco, do not  start.  Lung cancer screening is recommended for adults aged 55-80 years who are at high risk for developing lung cancer because of a history of smoking. A yearly low-dose CT scan of the lungs is recommended for people who have at least a 30-pack-year history of smoking and are current smokers or have quit within the past 15 years. A pack year of smoking is smoking an average of 1 pack of cigarettes a day for 1 year (for example, a 30-pack-year history of smoking could mean smoking 1 pack a day for 30 years or 2 packs a day for 15 years). Yearly screening should continue until the smoker has stopped smoking for at least 15 years. Yearly screening should be stopped for people who develop a health problem that would prevent them from having lung cancer treatment.  If you choose to drink alcohol, do not have more than 2 drinks per day. One drink is considered to be 12 oz (360 mL) of beer, 5 oz (150 mL) of wine, or 1.5 oz (45 mL) of liquor.  Avoid the use of street drugs. Do not share needles with anyone. Ask for help if you need support or instructions about stopping the use of drugs.  High blood pressure causes heart disease and increases the risk of stroke. Blood pressure should be checked at least every 1-2 years. Ongoing high blood pressure should be treated with medicines if weight loss and exercise are not effective.  If you are 45-79 years old, ask your health care [name] if   you should take aspirin to prevent heart disease.  Diabetes screening involves taking a blood sample to check your fasting blood sugar level. This should be done once every 3 years after age 45 if you are at a normal weight and without risk factors for diabetes. Testing should be considered at a younger age or be carried out more frequently if you are overweight and have at least 1 risk factor for diabetes.  Colorectal cancer can be detected and often prevented. Most routine colorectal cancer screening begins at the age of 50  and continues through age 75. However, your health care [name] [name] may recommend screening at an earlier age if you have risk factors for colon cancer. On a yearly basis, your health care [name] [name] may provide home test kits to check for hidden blood in the stool. A small camera at the end of a tube may be used to directly examine the colon (sigmoidoscopy or colonoscopy) to detect the earliest forms of colorectal cancer. Talk to your health care [name] [name] about this at age 50 when routine screening begins. A direct exam of the colon should be repeated every 5-10 years through age 75, unless early forms of precancerous polyps or small growths are found.  People who are at an increased risk for hepatitis B should be screened for this virus. You are considered at high risk for hepatitis B if:  You were born in a country where hepatitis B occurs often. Talk with your health care [name] [name] about which countries are considered high risk.  Your parents were born in a high-risk country and you have not received a shot to protect against hepatitis B (hepatitis B vaccine).  You have HIV or AIDS.  You use needles to inject street drugs.  You live with, or have sex with, someone who has hepatitis B.  You are a man who has sex with other men (MSM).  You get hemodialysis treatment.  You take certain medicines for conditions like cancer, organ transplantation, and autoimmune conditions.  Hepatitis C blood testing is recommended for all people born from 1945 through 1965 and any individual with known risk factors for hepatitis C.  Healthy men should no longer receive prostate-specific antigen (PSA) blood tests as part of routine cancer screening. Talk to your health care [name] [name] about prostate cancer screening.  Testicular cancer screening is not recommended for adolescents or adult males who have no symptoms. Screening includes self-exam, a health care [name] exam, and other screening tests. Consult with your  health care [name] [name] about any symptoms you have or any concerns you have about testicular cancer.  Practice safe sex. Use condoms and avoid high-risk sexual practices to reduce the spread of sexually transmitted infections (STIs).  You should be screened for STIs, including gonorrhea and chlamydia if:  You are sexually active and are younger than 24 years.  You are older than 24 years, and your health care [name] [name] tells you that you are at risk for this type of infection.  Your sexual activity has changed since you were last screened, and you are at an increased risk for chlamydia or gonorrhea. Ask your health care [name] [name] if you are at risk.  If you are at risk of being infected with HIV, it is recommended that you take a prescription medicine daily to prevent HIV infection. This is called pre-exposure prophylaxis (PrEP). You are considered at risk if:  You are a man who has sex with other men (MSM).  You are a heterosexual man who   is sexually active with multiple partners.  You take drugs by injection.  You are sexually active with a partner who has HIV.  Talk with your health care Mischele Detter about whether you are at high risk of being infected with HIV. If you choose to begin PrEP, you should first be tested for HIV. You should then be tested every 3 months for as long as you are taking PrEP.  Use sunscreen. Apply sunscreen liberally and repeatedly throughout the day. You should seek shade when your shadow is shorter than you. Protect yourself by wearing long sleeves, pants, a wide-brimmed hat, and sunglasses year round whenever you are outdoors.  Tell your health care [name] [name] of new moles or changes in moles, especially if there is a change in shape or color. Also, tell your health care [name] [name] if a mole is larger than the size of a pencil eraser.  A one-time screening for abdominal aortic aneurysm (AAA) and surgical repair of large AAAs by ultrasound is recommended for men aged  65-75 years who are current or former smokers.  Stay current with your vaccines (immunizations). Document Released: 12/06/2007 Document Revised: 06/14/2013 Document Reviewed: 11/04/2010 Exit[employer] Patient Information 2015 [employer]. This information is not intended to replace advice given to you by your health care Kwane Rohl. Make sure you discuss any questions you have with your health care Evea Sheek.  

## 2015-03-16 NOTE — Progress Notes (Signed)
Subjective:    Patient ID: [name] [name], male    DOB: Jan 06, 1941, 74 y.o.   MRN: [id number]  Patient here today for follow up of chronic medical problems.  Hypertension This is a chronic problem. The current episode started more than 1 year ago. The problem has been waxing and waning since onset. The problem is controlled. Pertinent negatives include no blurred vision, chest pain, headaches, neck pain, palpitations or shortness of breath. Agents associated with hypertension include thyroid hormones. Risk factors for coronary artery disease include dyslipidemia, family history, male gender and sedentary lifestyle. Past treatments include calcium channel blockers and ACE inhibitors. Compliance problems include exercise.  Hypertensive end-organ damage includes a thyroid problem.  Hyperlipidemia This is a chronic problem. The current episode started more than 1 year ago. Recent lipid tests were reviewed and are variable. Exacerbating diseases include hypothyroidism. Pertinent negatives include no chest pain or shortness of breath. Current antihyperlipidemic treatment includes statins. Compliance problems include adherence to exercise.  Risk factors for coronary artery disease include dyslipidemia, hypertension, male sex and a sedentary lifestyle.  Thyroid Problem Presents for follow-up visit. Patient reports no anxiety, cold intolerance, dry skin or palpitations. The symptoms have been stable. Past treatments include levothyroxine. His past medical history is significant for hyperlipidemia.  vitamin D def  Vitamin d OTC daily -no side effects AAA repair Doing well and sees cardiology every 6 months. Had echo cardiogram 2 weeks ago- has follow up appointment next week.  Review of Systems  Constitutional: Negative.   HENT: Positive for congestion and sinus pressure.   Eyes: Positive for discharge and redness. Negative for blurred vision.  Respiratory: Positive for cough (Productive of moderate  amount of white milky substance. ). Negative for chest tightness and shortness of breath.   Cardiovascular: Negative for chest pain and palpitations.  Endocrine: Negative for cold intolerance.  Genitourinary:       Denies urinary in continence  Musculoskeletal: Negative for neck pain.  Neurological: Negative for headaches.  Psychiatric/Behavioral: The patient is not nervous/anxious.   All other systems reviewed and are negative.      Objective:   Physical Exam  Constitutional: He is oriented to person, place, and time. He appears well-developed and well-nourished.  HENT:  Head: Normocephalic.  Right Ear: Hearing, tympanic membrane, external ear and ear canal normal.  Left Ear: Hearing, tympanic membrane, external ear and ear canal normal.  Nose: Right sinus exhibits no frontal sinus tenderness. Left sinus exhibits no frontal sinus tenderness.  Mouth/Throat: Uvula is midline, oropharynx is clear and moist and mucous membranes are normal.  Eyes: EOM are normal. Pupils are equal, round, and reactive to light.  Neck: Normal range of motion. Neck supple. No JVD present. No thyromegaly present.  Cardiovascular: Normal rate, regular rhythm and intact distal pulses.  Exam reveals no gallop and no friction rub.   Murmur (3/6 systolic) heard. 3/6 bil carotid bruits  Pulmonary/Chest: Effort normal and breath sounds normal. No respiratory distress. He has no wheezes. He has no rales. He exhibits no tenderness.  Abdominal: Soft. Bowel sounds are normal. He exhibits no mass. There is no tenderness.  Genitourinary: Penis normal.  Refuses digital rectal exam   Musculoskeletal: Normal range of motion. He exhibits no edema.  Lymphadenopathy:    He has no cervical adenopathy.  Neurological: He is alert and oriented to person, place, and time. No cranial nerve deficit.  Skin: Skin is warm and dry.  Psychiatric: He has a normal mood and affect.  His behavior is normal. Judgment and thought content  normal.   BP 145/82 mmHg  Pulse 67  Temp(Src) 97.1 F (36.2 C) (Oral)  Ht 5' 11"  (1.803 m)  Wt 189 lb (85.73 kg)  BMI 26.37 kg/m2      Assessment & Plan:   1. Essential hypertension Do not add slat to diet - lisinopril (PRINIVIL,ZESTRIL) 40 MG tablet; Take 1 tablet (40 mg total) by mouth daily.  Dispense: 30 tablet; Refill: 11 - amLODipine (NORVASC) 5 MG tablet; TAKE ONE TABLET BY MOUTH ONE TIME DAILY  Dispense: 30 tablet; Refill: 5 - CMP14+EGFR   2. HLD (hyperlipidemia) Low fat diet - atorvastatin (LIPITOR) 40 MG tablet; TAKE ONE TABLET BY MOUTH ONE TIME DAILY  Dispense: 30 tablet; Refill: 11 - Lipid panel  3. S/P AAA repair  4. Vitamin D deficiency - cholecalciferol (VITAMIN D) 1000 UNITS tablet; Take 1 tablet (1,000 Units total) by mouth daily.  Dispense: 30 tablet; Refill: 6  5. Hypothyroidism, unspecified hypothyroidism type - levothyroxine (SYNTHROID, LEVOTHROID) 75 MCG tablet; Take 1 tablet (75 mcg total) by mouth daily.  Dispense: 30 tablet; Refill: 11    Labs pending Health maintenance reviewed Diet and exercise encouraged Continue all meds Follow up  In 6 months    [place], FNP

## 2015-03-17 LAB — LIPID PANEL
CHOLESTEROL TOTAL: 113 mg/dL (ref 100–199)
Chol/HDL Ratio: 4.2 ratio units (ref 0.0–5.0)
HDL: 27 mg/dL — ABNORMAL LOW (ref >39)
LDL Calculated: 57 mg/dL (ref 0–99)
Triglycerides: 147 mg/dL (ref 0–149)
VLDL CHOLESTEROL CAL: 29 mg/dL (ref 5–40)

## 2015-03-17 LAB — CMP14+EGFR
ALBUMIN: 4.6 g/dL (ref 3.5–4.8)
ALT: 72 IU/L — ABNORMAL HIGH (ref 0–44)
AST: 60 IU/L — ABNORMAL HIGH (ref 0–40)
Albumin/Globulin Ratio: 1.7 (ref 1.1–2.5)
Alkaline Phosphatase: 66 IU/L (ref 39–117)
BUN / CREAT RATIO: 15 (ref 10–22)
BUN: 15 mg/dL (ref 8–27)
Bilirubin Total: 0.4 mg/dL (ref 0.0–1.2)
CO2: 24 mmol/L (ref 18–29)
CREATININE: 1.02 mg/dL (ref 0.76–1.27)
Calcium: 10 mg/dL (ref 8.6–10.2)
Chloride: 103 mmol/L (ref 97–108)
GFR calc Af Amer: 83 mL/min/{1.73_m2} (ref >59)
GFR calc non Af Amer: 72 mL/min/{1.73_m2} (ref >59)
GLOBULIN, TOTAL: 2.7 g/dL (ref 1.5–4.5)
GLUCOSE: 108 mg/dL — AB (ref 65–99)
Potassium: 4.5 mmol/L (ref 3.5–5.2)
Sodium: 144 mmol/L (ref 134–144)
TOTAL PROTEIN: 7.3 g/dL (ref 6.0–8.5)

## 2015-03-17 LAB — PSA, TOTAL AND FREE
PROSTATE SPECIFIC AG, SERUM: 1.5 ng/mL (ref 0.0–4.0)
PSA, Free Pct: 34.7 %
PSA, Free: 0.52 ng/mL

## 2015-03-17 LAB — VITAMIN D 25 HYDROXY (VIT D DEFICIENCY, FRACTURES): Vit D, 25-Hydroxy: 37.8 ng/mL (ref 30.0–100.0)

## 2015-03-19 ENCOUNTER — Ambulatory Visit (INDEPENDENT_AMBULATORY_CARE_PROVIDER_SITE_OTHER): Payer: Medicare Other | Admitting: Cardiovascular Disease

## 2015-03-19 ENCOUNTER — Encounter: Payer: Self-pay | Admitting: Cardiovascular Disease

## 2015-03-19 VITALS — BP 136/79 | HR 81 | Ht 71.0 in | Wt 184.0 lb

## 2015-03-19 DIAGNOSIS — I739 Peripheral vascular disease, unspecified: Secondary | ICD-10-CM

## 2015-03-19 DIAGNOSIS — E785 Hyperlipidemia, unspecified: Secondary | ICD-10-CM | POA: Diagnosis not present

## 2015-03-19 DIAGNOSIS — I10 Essential (primary) hypertension: Secondary | ICD-10-CM | POA: Diagnosis not present

## 2015-03-19 DIAGNOSIS — I350 Nonrheumatic aortic (valve) stenosis: Secondary | ICD-10-CM

## 2015-03-19 NOTE — Progress Notes (Signed)
Patient ID: BETZALEL UMBARGER, male   DOB: [birth year], 74 y.o.   MRN: [id number]      SUBJECTIVE: The patient presents for follow-up of severe aortic stenosis. He also has a history of hypertension, hyperlipidemia, nonobstructive carotid artery disease, COPD, AAA s/p endovascular repair, and hypothyroidism.  He continues to feel well and remains very active and denies chest pain, shortness of breath, lightheadedness, dizziness, leg swelling, and syncope. He continues to golf and stays active around the house.  Echocardiogram on 02/28/15 demonstrated normal left ventricular size and systolic function, LVEF 63-84%, mild LVH, grade 1 diastolic dysfunction, severe aortic stenosis , mild aortic regurgitation , mean gradient 53 mmHg, valve area 0.71 cm.    Review of Systems: As per "subjective", otherwise negative.  No Known Allergies  Current Outpatient Prescriptions  Medication Sig Dispense Refill  . amLODipine (NORVASC) 5 MG tablet TAKE ONE TABLET BY MOUTH ONE TIME DAILY 30 tablet 5  . aspirin 325 MG EC tablet Take 325 mg by mouth daily.      Marland Kitchen atorvastatin (LIPITOR) 40 MG tablet TAKE ONE TABLET BY MOUTH ONE TIME DAILY 30 tablet 11  . cholecalciferol (VITAMIN D) 1000 UNITS tablet Take 1 tablet (1,000 Units total) by mouth daily. 30 tablet 6  . levothyroxine (SYNTHROID, LEVOTHROID) 75 MCG tablet Take 1 tablet (75 mcg total) by mouth daily. 30 tablet 11  . lisinopril (PRINIVIL,ZESTRIL) 40 MG tablet Take 1 tablet (40 mg total) by mouth daily. 30 tablet 11  . Multiple Vitamin (ONE-A-DAY MENS PO) Take 1 tablet by mouth daily.      . Omega-3 Fatty Acids (FISH OIL) 1200 MG CAPS Take 2 capsules by mouth daily.      No current facility-administered medications for this visit.    Past Medical History  Diagnosis Date  . Arthritis   . Hyperlipidemia   . Hypertension   . Joint pain   . Weight gain   . AAA (abdominal aortic aneurysm)   . Cancer 2006 and Nov. 9, 2013     [facility], right shoulder  .  Atherosclerotic peripheral vascular disease   . AAA (abdominal aortic aneurysm)   . COPD (chronic obstructive pulmonary disease)   . Thyroid disease     hypothyroid  . Bilateral carotid artery stenosis   . Vitamin D deficiency   . Skin cancer     Past Surgical History  Procedure Laterality Date  . Back surgery    . Hydrocele excision / repair    . Abdominal aortic aneurysm repair  08-09-09  . Spine surgery    . Skin cancer excision      Social History   Social History  . Marital Status: Married    Spouse Name: N/A  . Number of Children: N/A  . Years of Education: N/A   Occupational History  . 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 Smoking status: Former Smoker -- 1.00 packs/day for 51 years    Types: Cigarettes    Start date: 11/25/1954    Quit date: 10/27/2005  . Smokeless tobacco: Never Used  . Alcohol Use: No     Comment: none since 1999  . Drug Use: No  . Sexual Activity: Yes   Other Topics Concern  . Not on file   Social History Narrative     Filed Vitals:   03/19/15 1520  BP: 136/79  Pulse: 81  Height: '5\' 11"'$  (1.803 m)  Weight: 184 lb (  83.462 kg)    PHYSICAL EXAM General: NAD Neck: No JVD, no thyromegaly or thyroid nodule.  Lungs: Diminished but clear with normal respiratory effort. CV: Nondisplaced PMI. Regular rate and rhythm, normal S1/diminished S2, no S3/S4, late-peaking IV/VI ejection systolic murmur. No peripheral edema.  Abdomen: Soft, nontender, no distention.  Skin: Intact without lesions or rashes.  Neurologic: Alert and oriented x 3.  Psych: Normal affect. Extremities: No clubbing or cyanosis.  HEENT: Normal.   ECG: Most recent ECG reviewed.      ASSESSMENT AND PLAN: 1. Severe aortic stenosis: Symptomatically stable. I previously had an extensive discussion with him about the natural history of aortic stenosis. I informed him of symptoms he would likely experience in the future  including chest pain, shortness of breath, lightheadedness, dizziness, leg swelling, fatigue, and possibly syncope. I will repeat an echocardiogram in 6 months. I asked him to call me immediately should he experience any of these symptoms as he will eventually require aortic valve replacement. The patient is understanding and in agreement with this plan.  2. Essential HTN: Well controlled on amlodipine 5 mg and lisinopril 40 mg. No changes.  3. Hyperlipidemia: On high-intensity statin therapy with Lipitor 40 mg.  4. PVD: Followed by vascular surgery.  Dispo: f/u 3 months.   Kate Sable, M.D., F.A.C.C.

## 2015-03-19 NOTE — Patient Instructions (Signed)
Your physician recommends that you continue on your current medications as directed. Please refer to the Current Medication list given to you today.  Your physician recommends that you schedule a follow-up appointment in: 3 months  

## 2015-03-22 ENCOUNTER — Ambulatory Visit (INDEPENDENT_AMBULATORY_CARE_PROVIDER_SITE_OTHER): Payer: Medicare Other

## 2015-03-22 DIAGNOSIS — Z23 Encounter for immunization: Secondary | ICD-10-CM

## 2015-06-19 ENCOUNTER — Encounter: Payer: Self-pay | Admitting: Cardiovascular Disease

## 2015-06-19 ENCOUNTER — Ambulatory Visit (INDEPENDENT_AMBULATORY_CARE_PROVIDER_SITE_OTHER): Payer: Medicare Other | Admitting: Cardiovascular Disease

## 2015-06-19 VITALS — BP 142/82 | HR 67 | Ht 71.0 in | Wt 189.0 lb

## 2015-06-19 DIAGNOSIS — E785 Hyperlipidemia, unspecified: Secondary | ICD-10-CM

## 2015-06-19 DIAGNOSIS — I350 Nonrheumatic aortic (valve) stenosis: Secondary | ICD-10-CM

## 2015-06-19 DIAGNOSIS — I10 Essential (primary) hypertension: Secondary | ICD-10-CM | POA: Diagnosis not present

## 2015-06-19 DIAGNOSIS — I739 Peripheral vascular disease, unspecified: Secondary | ICD-10-CM

## 2015-06-19 NOTE — Patient Instructions (Signed)
Continue all current medications. Follow up in  3-4 months.

## 2015-06-19 NOTE — Progress Notes (Signed)
Patient ID: [name] [name], male   DOB: 1941/02/07, 74 y.o.   MRN: [id number]      SUBJECTIVE: The patient presents for follow-up of severe aortic stenosis. He also has a history of hypertension, hyperlipidemia, nonobstructive carotid artery disease, COPD, AAA s/p endovascular repair, and hypothyroidism.  He continues to feel well and remains very active and denies chest pain, shortness of breath, lightheadedness, dizziness, leg swelling, and syncope. He continues to golf and stays active around the house. Primary complaints related to hip and knee arthritis.  Echocardiogram on 02/28/15 demonstrated normal left ventricular size and systolic function, LVEF 12-87%, mild LVH, grade 1 diastolic dysfunction, severe aortic stenosis , mild aortic regurgitation , mean gradient 53 mmHg, valve area 0.71 cm.   Review of Systems: As per "subjective", otherwise negative.  No Known Allergies  Current Outpatient Prescriptions  Medication Sig Dispense Refill  . amLODipine (NORVASC) 5 MG tablet TAKE ONE TABLET BY MOUTH ONE TIME DAILY 30 tablet 5  . aspirin 325 MG EC tablet Take 325 mg by mouth daily.      Marland Kitchen atorvastatin (LIPITOR) 40 MG tablet TAKE ONE TABLET BY MOUTH ONE TIME DAILY 30 tablet 11  . cholecalciferol (VITAMIN D) 1000 UNITS tablet Take 1 tablet (1,000 Units total) by mouth daily. 30 tablet 6  . levothyroxine (SYNTHROID, LEVOTHROID) 75 MCG tablet Take 1 tablet (75 mcg total) by mouth daily. 30 tablet 11  . lisinopril (PRINIVIL,ZESTRIL) 40 MG tablet Take 1 tablet (40 mg total) by mouth daily. 30 tablet 11  . Multiple Vitamin (ONE-A-DAY MENS PO) Take 1 tablet by mouth daily.      . Omega-3 Fatty Acids (FISH OIL) 1200 MG CAPS Take 2 capsules by mouth daily.      No current facility-administered medications for this visit.    Past Medical History  Diagnosis Date  . Arthritis   . Hyperlipidemia   . Hypertension   . Joint pain   . Weight gain   . AAA (abdominal aortic aneurysm) ([place])   .  Cancer [facility]) 2006 and Nov. 9, 2013     [facility], right shoulder  . Atherosclerotic peripheral vascular disease ([place])   . AAA (abdominal aortic aneurysm) ([place])   . COPD (chronic obstructive pulmonary disease) ([place])   . Thyroid disease     hypothyroid  . Bilateral carotid artery stenosis   . Vitamin D deficiency   . Skin cancer     Past Surgical History  Procedure Laterality Date  . Back surgery    . Hydrocele excision / repair    . Abdominal aortic aneurysm repair  08-09-09  . Spine surgery    . Skin cancer excision      Social History   Social History  . Marital Status: Married    Spouse Name: N/A  . Number of Children: N/A  . Years of Education: N/A   Occupational History  . 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 Smoking status: Former Smoker -- 1.00 packs/day for 51 years    Types: Cigarettes    Start date: 11/25/1954    Quit date: 10/27/2005  . Smokeless tobacco: Never Used  . Alcohol Use: No     Comment: none since 1999  . Drug Use: No  . Sexual Activity: Yes   Other Topics Concern  . Not on file   Social History Narrative     Filed Vitals:   06/19/15 1027  BP: 142/82  Pulse: 67  Height: '5\' 11"'$  (1.803 m)  Weight: 189 lb (85.73 kg)  SpO2: 98%    PHYSICAL EXAM General: NAD Neck: No JVD, no thyromegaly or thyroid nodule.  Lungs: Diminished but clear with normal respiratory effort. CV: Nondisplaced PMI. Regular rate and rhythm, normal S1/diminished S2, no S3/S4, late-peaking IV/VI ejection systolic murmur. No peripheral edema.  Abdomen: Soft, nontender, no distention.  Skin: Intact without lesions or rashes.  Neurologic: Alert and oriented x 3.  Psych: Normal affect. Extremities: No clubbing or cyanosis.  HEENT: Normal.   ECG: Most recent ECG reviewed.      ASSESSMENT AND PLAN: 1. Severe aortic stenosis: Symptomatically stable. I previously had an extensive discussion with him about the natural  history of aortic stenosis. I informed him of symptoms he would likely experience in the future including chest pain, shortness of breath, lightheadedness, dizziness, leg swelling, fatigue, and possibly syncope. I will repeat an echocardiogram in 6 months. I asked him to call me immediately should he experience any of these symptoms as he will eventually require aortic valve replacement. The patient is understanding and in agreement with this plan.  2. Essential HTN: Reasonably controlled on amlodipine 5 mg and lisinopril 40 mg in context of severe AS. No changes.  3. Hyperlipidemia: On high-intensity statin therapy with Lipitor 40 mg.  4. PVD: Followed by vascular surgery.  Dispo: f/u 3-4 months.   Kate Sable, M.D., F.A.C.C.

## 2015-06-22 ENCOUNTER — Encounter: Payer: Self-pay | Admitting: Nurse Practitioner

## 2015-06-22 ENCOUNTER — Ambulatory Visit (INDEPENDENT_AMBULATORY_CARE_PROVIDER_SITE_OTHER): Payer: Medicare Other | Admitting: Nurse Practitioner

## 2015-06-22 VITALS — BP 144/82 | HR 67 | Temp 96.9°F | Ht 71.0 in | Wt 188.4 lb

## 2015-06-22 DIAGNOSIS — E785 Hyperlipidemia, unspecified: Secondary | ICD-10-CM | POA: Diagnosis not present

## 2015-06-22 DIAGNOSIS — I10 Essential (primary) hypertension: Secondary | ICD-10-CM

## 2015-06-22 DIAGNOSIS — Z9889 Other specified postprocedural states: Secondary | ICD-10-CM | POA: Diagnosis not present

## 2015-06-22 DIAGNOSIS — E038 Other specified hypothyroidism: Secondary | ICD-10-CM

## 2015-06-22 DIAGNOSIS — E559 Vitamin D deficiency, unspecified: Secondary | ICD-10-CM

## 2015-06-22 DIAGNOSIS — Z8679 Personal history of other diseases of the circulatory system: Secondary | ICD-10-CM

## 2015-06-22 DIAGNOSIS — E034 Atrophy of thyroid (acquired): Secondary | ICD-10-CM

## 2015-06-22 DIAGNOSIS — Z6826 Body mass index (BMI) 26.0-26.9, adult: Secondary | ICD-10-CM | POA: Diagnosis not present

## 2015-06-22 NOTE — Patient Instructions (Signed)

## 2015-06-22 NOTE — Progress Notes (Signed)
Subjective:    Patient ID: [name] [name], male    DOB: Nov 29, 1940, 74 y.o.   MRN: [id number]  Patient here today for follow up of chronic medical problems.  Hypertension This is a chronic problem. The current episode started more than 1 year ago. The problem has been waxing and waning since onset. The problem is controlled. Pertinent negatives include no blurred vision, chest pain, headaches, neck pain, palpitations or shortness of breath. Agents associated with hypertension include thyroid hormones. Risk factors for coronary artery disease include dyslipidemia, family history, male gender and sedentary lifestyle. Past treatments include calcium channel blockers and ACE inhibitors. Compliance problems include exercise.  Hypertensive end-organ damage includes a thyroid problem.  Hyperlipidemia This is a chronic problem. The current episode started more than 1 year ago. Recent lipid tests were reviewed and are variable. Exacerbating diseases include hypothyroidism. Pertinent negatives include no chest pain or shortness of breath. Current antihyperlipidemic treatment includes statins. Compliance problems include adherence to exercise.  Risk factors for coronary artery disease include dyslipidemia, hypertension, male sex and a sedentary lifestyle.  Thyroid Problem Presents for follow-up visit. Patient reports no anxiety, cold intolerance, dry skin or palpitations. The symptoms have been stable. Past treatments include levothyroxine. His past medical history is significant for hyperlipidemia.  vitamin D def  Vitamin d OTC daily -no side effects AAA repair Doing well and sees cardiology every 6 months, was just seen Tuesday of this week..    Review of Systems  Constitutional: Negative.   HENT: Positive for congestion and sinus pressure.   Eyes: Positive for discharge and redness. Negative for blurred vision.  Respiratory: Positive for cough (Productive of moderate amount of white milky  substance. ). Negative for chest tightness and shortness of breath.   Cardiovascular: Negative for chest pain and palpitations.  Endocrine: Negative for cold intolerance.  Genitourinary:       Denies urinary in continence  Musculoskeletal: Negative for neck pain.  Neurological: Negative for headaches.  Psychiatric/Behavioral: The patient is not nervous/anxious.   All other systems reviewed and are negative.      Objective:   Physical Exam  Constitutional: He is oriented to person, place, and time. He appears well-developed and well-nourished.  HENT:  Head: Normocephalic.  Right Ear: Hearing, tympanic membrane, external ear and ear canal normal.  Left Ear: Hearing, tympanic membrane, external ear and ear canal normal.  Nose: Right sinus exhibits no frontal sinus tenderness. Left sinus exhibits no frontal sinus tenderness.  Mouth/Throat: Uvula is midline, oropharynx is clear and moist and mucous membranes are normal.  Eyes: EOM are normal. Pupils are equal, round, and reactive to light.  Neck: Normal range of motion. Neck supple. No JVD present. No thyromegaly present.  Cardiovascular: Normal rate, regular rhythm and intact distal pulses.  Exam reveals no gallop and no friction rub.   Murmur (3/6 systolic) heard. 3/6 bil carotid bruits  Pulmonary/Chest: Effort normal and breath sounds normal. No respiratory distress. He has no wheezes. He has no rales. He exhibits no tenderness.  Abdominal: Soft. Bowel sounds are normal. He exhibits no mass. There is no tenderness.  Genitourinary: Penis normal.  Refuses digital rectal exam   Musculoskeletal: Normal range of motion. He exhibits no edema.  Lymphadenopathy:    He has no cervical adenopathy.  Neurological: He is alert and oriented to person, place, and time. No cranial nerve deficit.  Skin: Skin is warm and dry.  Psychiatric: He has a normal mood and affect. His behavior is  normal. Judgment and thought content normal.   BP 144/82 mmHg   Pulse 67  Temp(Src) 96.9 F (36.1 C) (Oral)  Ht 5' 11"  (1.803 m)  Wt 188 lb 6.4 oz (85.458 kg)  BMI 26.29 kg/m2       Assessment & Plan:   1. Essential hypertension Do not add salt to diet - CMP14+EGFR  2. Hypothyroidism due to acquired atrophy of thyroid  3. S/P AAA repair Keep follow up appointment with cardiologist  4. HLD (hyperlipidemia) Low fat diet - Lipid panel  5. BMI 26.0-26.9,adult Discussed diet and exercise for person with BMI >25 Will recheck weight in 3-6 months  6. Vitamin D deficiency    Labs pending Health maintenance reviewed Diet and exercise encouraged Continue all meds Follow up  In 3 months   [place], FNP

## 2015-06-23 LAB — CMP14+EGFR
A/G RATIO: 1.6 (ref 1.1–2.5)
ALT: 69 IU/L — AB (ref 0–44)
AST: 57 IU/L — ABNORMAL HIGH (ref 0–40)
Albumin: 4.6 g/dL (ref 3.5–4.8)
Alkaline Phosphatase: 63 IU/L (ref 39–117)
BUN/Creatinine Ratio: 13 (ref 10–22)
BUN: 15 mg/dL (ref 8–27)
Bilirubin Total: 0.5 mg/dL (ref 0.0–1.2)
CALCIUM: 9.9 mg/dL (ref 8.6–10.2)
CO2: 22 mmol/L (ref 18–29)
CREATININE: 1.15 mg/dL (ref 0.76–1.27)
Chloride: 102 mmol/L (ref 96–106)
GFR, EST AFRICAN AMERICAN: 72 mL/min/{1.73_m2} (ref >59)
GFR, EST NON AFRICAN AMERICAN: 62 mL/min/{1.73_m2} (ref >59)
Globulin, Total: 2.8 g/dL (ref 1.5–4.5)
Glucose: 120 mg/dL — ABNORMAL HIGH (ref 65–99)
POTASSIUM: 4.5 mmol/L (ref 3.5–5.2)
Sodium: 141 mmol/L (ref 134–144)
TOTAL PROTEIN: 7.4 g/dL (ref 6.0–8.5)

## 2015-06-23 LAB — LIPID PANEL
CHOL/HDL RATIO: 4 ratio (ref 0.0–5.0)
Cholesterol, Total: 116 mg/dL (ref 100–199)
HDL: 29 mg/dL — ABNORMAL LOW (ref >39)
LDL CALC: 60 mg/dL (ref 0–99)
TRIGLYCERIDES: 136 mg/dL (ref 0–149)
VLDL CHOLESTEROL CAL: 27 mg/dL (ref 5–40)

## 2015-07-13 ENCOUNTER — Encounter: Payer: Self-pay | Admitting: Family

## 2015-07-23 ENCOUNTER — Ambulatory Visit (INDEPENDENT_AMBULATORY_CARE_PROVIDER_SITE_OTHER)
Admission: RE | Admit: 2015-07-23 | Discharge: 2015-07-23 | Disposition: A | Discharge status: Home / Self Care | Payer: Medicare Other | Source: Ambulatory Visit | Attending: Family | Admitting: Family

## 2015-07-23 ENCOUNTER — Encounter: Payer: Self-pay | Admitting: Family

## 2015-07-23 ENCOUNTER — Ambulatory Visit (INDEPENDENT_AMBULATORY_CARE_PROVIDER_SITE_OTHER): Payer: Medicare Other | Admitting: Family

## 2015-07-23 ENCOUNTER — Ambulatory Visit (HOSPITAL_COMMUNITY)
Admission: RE | Admit: 2015-07-23 | Discharge: 2015-07-23 | Disposition: A | Discharge status: Home / Self Care | Payer: Medicare Other | Source: Ambulatory Visit | Attending: Family | Admitting: Family

## 2015-07-23 VITALS — BP 149/76 | HR 66 | Temp 97.9°F | Ht 71.0 in | Wt 183.5 lb

## 2015-07-23 DIAGNOSIS — I10 Essential (primary) hypertension: Secondary | ICD-10-CM | POA: Diagnosis not present

## 2015-07-23 DIAGNOSIS — I6523 Occlusion and stenosis of bilateral carotid arteries: Secondary | ICD-10-CM

## 2015-07-23 DIAGNOSIS — R0989 Other specified symptoms and signs involving the circulatory and respiratory systems: Secondary | ICD-10-CM

## 2015-07-23 DIAGNOSIS — I714 Abdominal aortic aneurysm, without rupture, unspecified: Secondary | ICD-10-CM

## 2015-07-23 DIAGNOSIS — Z95828 Presence of other vascular implants and grafts: Secondary | ICD-10-CM | POA: Diagnosis not present

## 2015-07-23 DIAGNOSIS — E785 Hyperlipidemia, unspecified: Secondary | ICD-10-CM | POA: Insufficient documentation

## 2015-07-23 NOTE — Patient Instructions (Signed)
Stroke Prevention Some medical conditions and behaviors are associated with an increased chance of having a stroke. You may prevent a stroke by making healthy choices and managing medical conditions. HOW CAN I REDUCE MY RISK OF HAVING A STROKE?   Stay physically active. Get at least 30 minutes of activity on most or all days.  Do not smoke. It may also be helpful to avoid exposure to secondhand smoke.  Limit alcohol use. Moderate alcohol use is considered to be:  No more than 2 drinks per day for men.  No more than 1 drink per day for nonpregnant women.  Eat healthy foods. This involves:  Eating 5 or more servings of fruits and vegetables a day.  Making dietary changes that address high blood pressure (hypertension), high cholesterol, diabetes, or obesity.  Manage your cholesterol levels.  Making food choices that are high in fiber and low in saturated fat, trans fat, and cholesterol may control cholesterol levels.  Take any prescribed medicines to control cholesterol as directed by your health care provider.  Manage your diabetes.  Controlling your carbohydrate and sugar intake is recommended to manage diabetes.  Take any prescribed medicines to control diabetes as directed by your health care provider.  Control your hypertension.  Making food choices that are low in salt (sodium), saturated fat, trans fat, and cholesterol is recommended to manage hypertension.  Ask your health care provider if you need treatment to lower your blood pressure. Take any prescribed medicines to control hypertension as directed by your health care provider.  If you are 18-39 years of age, have your blood pressure checked every 3-5 years. If you are 40 years of age or older, have your blood pressure checked every year.  Maintain a healthy weight.  Reducing calorie intake and making food choices that are low in sodium, saturated fat, trans fat, and cholesterol are recommended to manage  weight.  Stop drug abuse.  Avoid taking birth control pills.  Talk to your health care provider about the risks of taking birth control pills if you are over 35 years old, smoke, get migraines, or have ever had a blood clot.  Get evaluated for sleep disorders (sleep apnea).  Talk to your health care provider about getting a sleep evaluation if you snore a lot or have excessive sleepiness.  Take medicines only as directed by your health care provider.  For some people, aspirin or blood thinners (anticoagulants) are helpful in reducing the risk of forming abnormal blood clots that can lead to stroke. If you have the irregular heart rhythm of atrial fibrillation, you should be on a blood thinner unless there is a good reason you cannot take them.  Understand all your medicine instructions.  Make sure that other conditions (such as anemia or atherosclerosis) are addressed. SEEK IMMEDIATE MEDICAL CARE IF:   You have sudden weakness or numbness of the face, arm, or leg, especially on one side of the body.  Your face or eyelid droops to one side.  You have sudden confusion.  You have trouble speaking (aphasia) or understanding.  You have sudden trouble seeing in one or both eyes.  You have sudden trouble walking.  You have dizziness.  You have a loss of balance or coordination.  You have a sudden, severe headache with no known cause.  You have new chest pain or an irregular heartbeat. Any of these symptoms may represent a serious problem that is an emergency. Do not wait to see if the symptoms will   go away. Get medical help at once. Call your local emergency services (911 in [place] Do not drive yourself to the hospital.   This information is not intended to replace advice given to you by your health care provider. Make sure you discuss any questions you have with your health care provider.   Document Released: 07/17/2004 Document Revised: 06/30/2014 Document Reviewed:  12/10/2012 Elsevier Interactive Patient Education 2016 [employer]  

## 2015-07-23 NOTE — Progress Notes (Signed)
[facility] [place]  Established EVAR  History of Present Illness  [name] [name] is a 75 y.o. ([birth year]) male patient of Dr. Trula Slade who is status post aortic stent graft repair in February 2011. He also has mild/moderate extracranial carotid artery stenosis.   He returns for routine follow up.  The patient has not had new back or abdominal pain. He does have mild lumbar disc issues with a history of lumbar spine surgery. He denies claudication symptoms, denies non-healing wounds other than addressed skin cancer on his shoulder which has healed well. He is regularly physically active and watches his diet.  He denies any history of stroke or TIA symptoms.  He is seeing a cardiologist for monitoring of aortic valve stenosis and murmur.   Pt Diabetic: No Pt smoker: former smoker, quit 2007  Pt meds include: Statin : Yes ASA: Yes Other anticoagulants/antiplatelets: no    Past Medical History  Diagnosis Date  . Arthritis   . Hyperlipidemia   . Hypertension   . Joint pain   . Weight gain   . AAA (abdominal aortic aneurysm) ([place])   . Cancer [facility]) 2006 and Nov. 9, 2013     [facility], right shoulder  . Atherosclerotic peripheral vascular disease ([place])   . AAA (abdominal aortic aneurysm) ([place])   . COPD (chronic obstructive pulmonary disease) ([place])   . Thyroid disease     hypothyroid  . Bilateral carotid artery stenosis   . Vitamin D deficiency   . Skin cancer    Past Surgical History  Procedure Laterality Date  . Back surgery    . Hydrocele excision / repair    . Abdominal aortic aneurysm repair  08-09-09  . Spine surgery    . Skin cancer excision     Social History Social History  Substance Use Topics  . Smoking status: Former Smoker -- 1.00 packs/day for 51 years    Types: Cigarettes    Start date: 11/25/1954    Quit date: 10/27/2005  . Smokeless tobacco: Never Used  . Alcohol Use: No     Comment: none since 1999   Family  History Family History  Problem Relation Age of Onset  . Heart disease Father     Heart Disease before age 71  . Alcohol abuse Father   . Heart attack Father   . Cancer Mother     Lung  . COPD Mother   . COPD Sister   . Lupus Sister   . Hypertension Brother   . Gout Brother   . Heart attack Son   . Gout Brother    Current Outpatient Prescriptions on File Prior to Visit  Medication Sig Dispense Refill  . amLODipine (NORVASC) 5 MG tablet TAKE ONE TABLET BY MOUTH ONE TIME DAILY 30 tablet 5  . aspirin 325 MG EC tablet Take 325 mg by mouth daily.      Marland Kitchen atorvastatin (LIPITOR) 40 MG tablet TAKE ONE TABLET BY MOUTH ONE TIME DAILY 30 tablet 11  . cholecalciferol (VITAMIN D) 1000 UNITS tablet Take 1 tablet (1,000 Units total) by mouth daily. 30 tablet 6  . levothyroxine (SYNTHROID, LEVOTHROID) 75 MCG tablet Take 1 tablet (75 mcg total) by mouth daily. 30 tablet 11  . lisinopril (PRINIVIL,ZESTRIL) 40 MG tablet Take 1 tablet (40 mg total) by mouth daily. 30 tablet 11  . Multiple Vitamin (ONE-A-DAY MENS PO) Take 1 tablet by mouth daily.      . Omega-3 Fatty Acids (FISH OIL) 1200  MG CAPS Take 2 capsules by mouth daily.      No current facility-administered medications on file prior to visit.   No Known Allergies   ROS: See HPI for pertinent positives and negatives.  Physical Examination  Filed Vitals:   07/23/15 1003 07/23/15 1005  BP: 146/80 149/76  Pulse: 66   Temp:  97.9 F (36.6 C)  TempSrc:  Oral  Height: '5\' 11"'$  (1.803 m)   Weight: 183 lb 8 oz (83.235 kg)   SpO2: 96%    Body mass index is 25.6 kg/(m^2).   General: A&O x 3, WD.  Pulmonary: Sym exp, good air movt, CTAB, no rales, rhonchi, & wheezing.  Cardiac: RRR, Nl S1, S2, positive aortic murmur.  Vascular: Vessel Right Left  Radial 2+Palpable 2+Palpable  Carotid Cardiac murmur transmitted Cardiac murmur transmitted  Aorta Not palpable N/A  Femoral not Palpable Not Palpable   Popliteal Not palpable Not palpable  PT Not palpable, triphasic by Doppler Not Palpable, triphasic by Doppler  DP Not Palpable Not Palpable, triphasic by Doppler   Gastrointestinal: soft, NTND, -G/R, - HSM, - palpable masses, - CVAT B.  Musculoskeletal: M/S 5/5 throughout, Extremities without ischemic changes.  Neurologic: Pain and light touch intact in extremities, Motor exam as listed above        CTA Abd/Pelvis Duplex (Date: 09/30/10) . AAA stent remains in good position with no complicating features. No endovascular leak is seen. 2. Decrease in size of native aneurysm sac, measuring 42 x 34 mm compared to prior measurements of 51 x 46 mm. 3. Question of slight focal thickening of the left urinary bladder wall. Consider cystoscopy if further assessment is warranted clinically. 4. Multiple rectosigmoid colonic diverticula.   Non-Invasive Vascular Imaging (07/23/15)  EVAR Duplex   AAA sac size: 4.0 cm  07/21/14: 3.5 cm  no endoleak detected  Technically difficult due to bowel gas and may not be accurate.  Comparison is therefore not appopriate   Carotid Duplex: 40-59% right ICA stenosis. <40% left ICA stenosis. No significant change compared to prior exam.   ABI (Date: 07/23/2015)  R: 1.12 (no previous ABI), DP: tri, PT: tri, TBI: 0.79  L: 1.14 (N/A), DP: tri, PT: tri, TBI: 0.84     Medical Decision Making  [name] [name] is a 75 y.o. male who is status post aortic stent graft repair in February 2011. He also has mild/moderate extracranial carotid artery stenosis.  He has no back or abdominal pain.  He has no hx of stroke or TIA. Today's carotid duplex suggests 40-59% right ICA stenosis and <40% left ICA stenosis. No significant change compared to prior exam.   Today's EVAR duplex suggests AAA sac size: 4.0 cm  07/21/14: 3.5 cm  no endoleak detected  Technically difficult due to bowel gas and may not be  accurate. Comparison is therefore not appropriate  Non palpable pedal pulses: ABI's and TBI's are normal with all triphasic waveforms.    I discussed with Dr. Trula Slade the result of today's EVAR duplex compared to a year ago, and that pt denies back or abdominal pain  I discussed with the patient the importance of surveillance of the endograft.  The next endograft duplex will be scheduled for 12 months. Will also duplex extracranial carotid arteries in a year.   The patient will follow up with [place] in 12 months with these studies.  I emphasized the importance of maximal medical management including strict control of blood pressure, blood glucose, and lipid levels, antiplatelet  agents, obtaining regular exercise, and cessation of smoking.   Thank you for allowing [place] to participate in this patient's care.  Clemon Chambers, RN, MSN, FNP-C [facility] [place] Office: [facility] Physician: Trula Slade  07/23/2015, 10:39 AM

## 2015-09-14 ENCOUNTER — Ambulatory Visit (INDEPENDENT_AMBULATORY_CARE_PROVIDER_SITE_OTHER): Payer: Medicare Other | Admitting: Cardiovascular Disease

## 2015-09-14 ENCOUNTER — Encounter: Payer: Self-pay | Admitting: Cardiovascular Disease

## 2015-09-14 VITALS — BP 142/73 | HR 64 | Ht 71.0 in | Wt 185.0 lb

## 2015-09-14 DIAGNOSIS — I10 Essential (primary) hypertension: Secondary | ICD-10-CM

## 2015-09-14 DIAGNOSIS — I4891 Unspecified atrial fibrillation: Secondary | ICD-10-CM | POA: Diagnosis not present

## 2015-09-14 DIAGNOSIS — I6523 Occlusion and stenosis of bilateral carotid arteries: Secondary | ICD-10-CM | POA: Diagnosis not present

## 2015-09-14 DIAGNOSIS — E785 Hyperlipidemia, unspecified: Secondary | ICD-10-CM | POA: Diagnosis not present

## 2015-09-14 DIAGNOSIS — Z8679 Personal history of other diseases of the circulatory system: Secondary | ICD-10-CM

## 2015-09-14 DIAGNOSIS — Z9889 Other specified postprocedural states: Secondary | ICD-10-CM

## 2015-09-14 DIAGNOSIS — I739 Peripheral vascular disease, unspecified: Secondary | ICD-10-CM

## 2015-09-14 DIAGNOSIS — I350 Nonrheumatic aortic (valve) stenosis: Secondary | ICD-10-CM | POA: Diagnosis not present

## 2015-09-14 NOTE — Patient Instructions (Signed)
Your physician has requested that you have an echocardiogram. Echocardiography is a painless test that uses sound waves to create images of your heart. It provides your doctor with information about the size and shape of your heart and how well your heart's chambers and valves are working. This procedure takes approximately one hour. There are no restrictions for this procedure. Office will contact with results via phone or letter.   Continue all current medications. Follow up in  3 months

## 2015-09-14 NOTE — Progress Notes (Signed)
Patient ID: AMBERS IYENGAR, male   DOB: 1940/10/02, 75 y.o.   MRN: [id number]      SUBJECTIVE: The patient presents for follow-up of severe aortic stenosis. He also has a history of hypertension, hyperlipidemia, nonobstructive carotid artery disease, COPD, AAA s/p endovascular repair, and hypothyroidism.  He continues to feel well and remains very active and denies chest pain, shortness of breath, lightheadedness, dizziness, leg swelling, and syncope. He continues to golf and stays active around the house.  Echocardiogram on 02/28/15 demonstrated normal left ventricular size and systolic function, LVEF 78-24%, mild LVH, grade 1 diastolic dysfunction, severe aortic stenosis , mild aortic regurgitation , mean gradient 53 mmHg, valve area 0.71 cm.  ECG today shows sinus rhythm with a diffuse T-wave abnormality suggestive of inferior and anterolateral ischemia, not markedly different from 2016.  Review of Systems: As per "subjective", otherwise negative.  No Known Allergies  Current Outpatient Prescriptions  Medication Sig Dispense Refill  . amLODipine (NORVASC) 5 MG tablet TAKE ONE TABLET BY MOUTH ONE TIME DAILY 30 tablet 5  . aspirin 325 MG EC tablet Take 325 mg by mouth daily.      Marland Kitchen atorvastatin (LIPITOR) 40 MG tablet TAKE ONE TABLET BY MOUTH ONE TIME DAILY 30 tablet 11  . cholecalciferol (VITAMIN D) 1000 UNITS tablet Take 1 tablet (1,000 Units total) by mouth daily. 30 tablet 6  . levothyroxine (SYNTHROID, LEVOTHROID) 75 MCG tablet Take 1 tablet (75 mcg total) by mouth daily. 30 tablet 11  . lisinopril (PRINIVIL,ZESTRIL) 40 MG tablet Take 1 tablet (40 mg total) by mouth daily. 30 tablet 11  . Multiple Vitamin (ONE-A-DAY MENS PO) Take 1 tablet by mouth daily.      . Omega-3 Fatty Acids (FISH OIL) 1200 MG CAPS Take 2 capsules by mouth daily.      No current facility-administered medications for this visit.    Past Medical History  Diagnosis Date  . Arthritis   . Hyperlipidemia   .  Hypertension   . Joint pain   . Weight gain   . AAA (abdominal aortic aneurysm) ([place])   . Cancer [facility]) 2006 and Nov. 9, 2013     [facility], right shoulder  . Atherosclerotic peripheral vascular disease ([place])   . AAA (abdominal aortic aneurysm) ([place])   . COPD (chronic obstructive pulmonary disease) ([place])   . Thyroid disease     hypothyroid  . Bilateral carotid artery stenosis   . Vitamin D deficiency   . Skin cancer     Past Surgical History  Procedure Laterality Date  . Back surgery    . Hydrocele excision / repair    . Abdominal aortic aneurysm repair  08-09-09  . Spine surgery    . Skin cancer excision      Social History   Social History  . Marital Status: Married    Spouse Name: N/A  . Number of Children: N/A  . Years of Education: N/A   Occupational History  . 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 Smoking status: Former Smoker -- 1.00 packs/day for 51 years    Types: Cigarettes    Start date: 11/25/1954    Quit date: 10/27/2005  . Smokeless tobacco: Never Used  . Alcohol Use: No     Comment: none since 1999  . Drug Use: No  . Sexual Activity: Yes   Other Topics Concern  . Not on file   Social History Narrative  Filed Vitals:   09/14/15 0914  BP: 142/73  Pulse: 64  Height: '5\' 11"'$  (1.803 m)  Weight: 185 lb (83.915 kg)    PHYSICAL EXAM General: NAD Neck: No JVD, no thyromegaly or thyroid nodule.  Lungs: Diminished but clear with normal respiratory effort. CV: Nondisplaced PMI. Regular rate and rhythm, normal S1/diminished S2, no S3/S4, late-peaking IV/VI ejection systolic murmur. No peripheral edema.  Abdomen: Soft, nontender, no distention.  Skin: Intact without lesions or rashes.  Neurologic: Alert and oriented x 3.  Psych: Normal affect. Extremities: No clubbing or cyanosis.  HEENT: Normal.   ECG: Most recent ECG reviewed.      ASSESSMENT AND PLAN: 1. Severe aortic stenosis:  Symptomatically stable. I previously had an extensive discussion with him about the natural history of aortic stenosis. I informed him of symptoms he would likely experience in the future including chest pain, shortness of breath, lightheadedness, dizziness, leg swelling, fatigue, and possibly syncope. I will repeat an echocardiogram to assess for interval change in aortic stenosis severity and LVEF. I asked him to call me immediately should he experience any of these symptoms as he will eventually require aortic valve replacement. The patient is understanding and in agreement with this plan.  2. Essential HTN: Reasonably controlled on amlodipine 5 mg and lisinopril 40 mg in context of severe AS. No changes.  3. Hyperlipidemia: On high-intensity statin therapy with Lipitor 40 mg.  4. PVD: Followed by vascular surgery.  Dispo: f/u 3 months.   Kate Sable, M.D., F.A.C.C.

## 2015-09-19 ENCOUNTER — Ambulatory Visit (INDEPENDENT_AMBULATORY_CARE_PROVIDER_SITE_OTHER): Payer: Medicare Other

## 2015-09-19 ENCOUNTER — Other Ambulatory Visit: Payer: Self-pay

## 2015-09-19 DIAGNOSIS — I350 Nonrheumatic aortic (valve) stenosis: Secondary | ICD-10-CM

## 2015-09-20 ENCOUNTER — Telehealth: Payer: Self-pay | Admitting: *Deleted

## 2015-09-20 NOTE — Telephone Encounter (Signed)
-----   Message from Bernita Raisin, RN sent at 09/20/2015 12:04 PM EDT ----- [name] [name] Pt  ----- Message -----    From: Herminio Commons, MD    Sent: 09/20/2015   9:35 AM      To: Bernita Raisin, RN  Normal pumping function with severe aortic stenosis. Gradients have increased. Continue close clinical follow up.

## 2015-09-20 NOTE — Telephone Encounter (Signed)
Notes Recorded by Laurine Blazer, LPN on 1/59/5396 at 7:28 PM Patient notified. Copy to pmd. Follow up scheduled for June.

## 2015-10-01 ENCOUNTER — Other Ambulatory Visit: Payer: Self-pay | Admitting: Nurse Practitioner

## 2015-10-04 ENCOUNTER — Encounter (INDEPENDENT_AMBULATORY_CARE_PROVIDER_SITE_OTHER): Payer: Self-pay

## 2015-10-04 ENCOUNTER — Encounter: Payer: Self-pay | Admitting: Nurse Practitioner

## 2015-10-04 ENCOUNTER — Ambulatory Visit (INDEPENDENT_AMBULATORY_CARE_PROVIDER_SITE_OTHER): Payer: Medicare Other | Admitting: Nurse Practitioner

## 2015-10-04 VITALS — BP 142/86 | HR 64 | Temp 97.1°F | Ht 71.0 in | Wt 183.2 lb

## 2015-10-04 DIAGNOSIS — E559 Vitamin D deficiency, unspecified: Secondary | ICD-10-CM | POA: Diagnosis not present

## 2015-10-04 DIAGNOSIS — Z6826 Body mass index (BMI) 26.0-26.9, adult: Secondary | ICD-10-CM

## 2015-10-04 DIAGNOSIS — I10 Essential (primary) hypertension: Secondary | ICD-10-CM | POA: Diagnosis not present

## 2015-10-04 DIAGNOSIS — E038 Other specified hypothyroidism: Secondary | ICD-10-CM | POA: Diagnosis not present

## 2015-10-04 DIAGNOSIS — E785 Hyperlipidemia, unspecified: Secondary | ICD-10-CM | POA: Diagnosis not present

## 2015-10-04 DIAGNOSIS — E034 Atrophy of thyroid (acquired): Secondary | ICD-10-CM

## 2015-10-04 MED ORDER — AMLODIPINE BESYLATE 5 MG PO TABS: 5.0000 mg | ORAL_TABLET | Freq: Every day | ORAL | Status: DC

## 2015-10-04 NOTE — Progress Notes (Signed)
Subjective:    Patient ID: [name] [name], male    DOB: [birth year], 75 y.o.   MRN: [id number]  Patient here today for follow up of chronic medical problems.  Outpatient Encounter Prescriptions as of 10/04/2015  Medication Sig  . amLODipine (NORVASC) 5 MG tablet TAKE 1 TABLET ONCE A DAY  . aspirin 325 MG EC tablet Take 325 mg by mouth daily.    Marland Kitchen atorvastatin (LIPITOR) 40 MG tablet TAKE ONE TABLET BY MOUTH ONE TIME DAILY  . cholecalciferol (VITAMIN D) 1000 UNITS tablet Take 1 tablet (1,000 Units total) by mouth daily.  Marland Kitchen levothyroxine (SYNTHROID, LEVOTHROID) 75 MCG tablet Take 1 tablet (75 mcg total) by mouth daily.  Marland Kitchen lisinopril (PRINIVIL,ZESTRIL) 40 MG tablet Take 1 tablet (40 mg total) by mouth daily.  . Multiple Vitamin (ONE-A-DAY MENS PO) Take 1 tablet by mouth daily.    . Omega-3 Fatty Acids (FISH OIL) 1200 MG CAPS Take 2 capsules by mouth daily.    No facility-administered encounter medications on file as of 10/04/2015.     Hypertension This is a chronic problem. The current episode started more than 1 year ago. The problem has been waxing and waning since onset. The problem is controlled. Pertinent negatives include no blurred vision, chest pain, headaches, neck pain, palpitations or shortness of breath. Agents associated with hypertension include thyroid hormones. Risk factors for coronary artery disease include dyslipidemia, family history, male gender and sedentary lifestyle. Past treatments include calcium channel blockers and ACE inhibitors. Compliance problems include exercise.  Hypertensive end-organ damage includes a thyroid problem.  Hyperlipidemia This is a chronic problem. The current episode started more than 1 year ago. Recent lipid tests were reviewed and are variable. Exacerbating diseases include hypothyroidism. Pertinent negatives include no chest pain or shortness of breath. Current antihyperlipidemic treatment includes statins. Compliance problems include  adherence to exercise.  Risk factors for coronary artery disease include dyslipidemia, hypertension, male sex and a sedentary lifestyle.  Thyroid Problem Presents for follow-up visit. Patient reports no anxiety, cold intolerance, dry skin or palpitations. The symptoms have been stable. Past treatments include levothyroxine. His past medical history is significant for hyperlipidemia.  vitamin D def  Vitamin d OTC daily -no side effects AAA repair Doing well and sees cardiology every 6 months, was just seen Tuesday of this week..    Review of Systems  Constitutional: Negative.   HENT: Positive for congestion and sinus pressure.   Eyes: Positive for discharge and redness. Negative for blurred vision.  Respiratory: Positive for cough (Productive of moderate amount of white milky substance. ). Negative for chest tightness and shortness of breath.   Cardiovascular: Negative for chest pain and palpitations.  Endocrine: Negative for cold intolerance.  Genitourinary:       Denies urinary in continence  Musculoskeletal: Negative for neck pain.  Neurological: Negative for headaches.  Psychiatric/Behavioral: The patient is not nervous/anxious.   All other systems reviewed and are negative.      Objective:   Physical Exam  Constitutional: He is oriented to person, place, and time. He appears well-developed and well-nourished.  HENT:  Head: Normocephalic.  Right Ear: Hearing, tympanic membrane, external ear and ear canal normal.  Left Ear: Hearing, tympanic membrane, external ear and ear canal normal.  Nose: Right sinus exhibits no frontal sinus tenderness. Left sinus exhibits no frontal sinus tenderness.  Mouth/Throat: Uvula is midline, oropharynx is clear and moist and mucous membranes are normal.  Eyes: EOM are normal. Pupils are equal, round, and  reactive to light.  Neck: Normal range of motion. Neck supple. No JVD present. No thyromegaly present.  Cardiovascular: Normal rate, regular  rhythm and intact distal pulses.  Exam reveals no gallop and no friction rub.   Murmur (3/6 systolic) heard. 3/6 bil carotid bruits  Pulmonary/Chest: Effort normal and breath sounds normal. No respiratory distress. He has no wheezes. He has no rales. He exhibits no tenderness.  Abdominal: Soft. Bowel sounds are normal. He exhibits no mass. There is no tenderness.  Genitourinary: Penis normal.  Refuses digital rectal exam   Musculoskeletal: Normal range of motion. He exhibits no edema.  Lymphadenopathy:    He has no cervical adenopathy.  Neurological: He is alert and oriented to person, place, and time. No cranial nerve deficit.  Skin: Skin is warm and dry.  Psychiatric: He has a normal mood and affect. His behavior is normal. Judgment and thought content normal.   BP 142/86 mmHg  Pulse 64  Temp(Src) 97.1 F (36.2 C) (Oral)  Ht 5' 11"  (1.803 m)  Wt 183 lb 3.2 oz (83.099 kg)  BMI 25.56 kg/m2        Assessment & Plan:   1. HLD (hyperlipidemia) Do not add salt to diet - CMP14+EGFR - Lipid panel  2. Essential hypertension Do not add salt to diet - CMP14+EGFR - amLODipine (NORVASC) 5 MG tablet; Take 1 tablet (5 mg total) by mouth daily.  Dispense: 30 tablet; Refill: 5  3. Hypothyroidism due to acquired atrophy of thyroid - CMP14+EGFR - Thyroid Panel With TSH  4. Vitamin D deficiency - CMP14+EGFR - VITAMIN D 25 Hydroxy (Vit-D Deficiency, Fractures)    Labs pending Health maintenance reviewed Diet and exercise encouraged Continue all meds Follow up  In 3 month   [place], FNP

## 2015-10-04 NOTE — Patient Instructions (Signed)

## 2015-10-05 LAB — CMP14+EGFR
A/G RATIO: 1.8 (ref 1.2–2.2)
ALBUMIN: 4.6 g/dL (ref 3.5–4.8)
ALK PHOS: 61 IU/L (ref 39–117)
ALT: 54 IU/L — ABNORMAL HIGH (ref 0–44)
AST: 49 IU/L — ABNORMAL HIGH (ref 0–40)
BILIRUBIN TOTAL: 0.5 mg/dL (ref 0.0–1.2)
BUN/Creatinine Ratio: 16 (ref 10–24)
BUN: 17 mg/dL (ref 8–27)
CO2: 26 mmol/L (ref 18–29)
Calcium: 9.5 mg/dL (ref 8.6–10.2)
Chloride: 103 mmol/L (ref 96–106)
Creatinine, Ser: 1.08 mg/dL (ref 0.76–1.27)
GFR, EST AFRICAN AMERICAN: 77 mL/min/{1.73_m2} (ref >59)
GFR, EST NON AFRICAN AMERICAN: 67 mL/min/{1.73_m2} (ref >59)
GLOBULIN, TOTAL: 2.5 g/dL (ref 1.5–4.5)
GLUCOSE: 106 mg/dL — AB (ref 65–99)
Potassium: 4.7 mmol/L (ref 3.5–5.2)
Sodium: 144 mmol/L (ref 134–144)
Total Protein: 7.1 g/dL (ref 6.0–8.5)

## 2015-10-05 LAB — LIPID PANEL
CHOL/HDL RATIO: 4 ratio (ref 0.0–5.0)
Cholesterol, Total: 99 mg/dL — ABNORMAL LOW (ref 100–199)
HDL: 25 mg/dL — AB (ref >39)
LDL CALC: 52 mg/dL (ref 0–99)
TRIGLYCERIDES: 110 mg/dL (ref 0–149)
VLDL CHOLESTEROL CAL: 22 mg/dL (ref 5–40)

## 2015-10-05 LAB — THYROID PANEL WITH TSH
Free Thyroxine Index: 1.5 (ref 1.2–4.9)
T3 UPTAKE RATIO: 26 % (ref 24–39)
T4, Total: 5.7 ug/dL (ref 4.5–12.0)
TSH: 3.92 u[IU]/mL (ref 0.450–4.500)

## 2015-10-05 LAB — VITAMIN D 25 HYDROXY (VIT D DEFICIENCY, FRACTURES): Vit D, 25-Hydroxy: 42.8 ng/mL (ref 30.0–100.0)

## 2015-10-08 ENCOUNTER — Encounter: Payer: Self-pay | Admitting: Family Medicine

## 2015-10-08 ENCOUNTER — Ambulatory Visit (INDEPENDENT_AMBULATORY_CARE_PROVIDER_SITE_OTHER): Payer: Medicare Other | Admitting: Family Medicine

## 2015-10-08 VITALS — BP 131/81 | HR 83 | Temp 98.9°F

## 2015-10-08 DIAGNOSIS — M5416 Radiculopathy, lumbar region: Secondary | ICD-10-CM

## 2015-10-08 DIAGNOSIS — M5417 Radiculopathy, lumbosacral region: Secondary | ICD-10-CM | POA: Diagnosis not present

## 2015-10-08 MED ORDER — PREDNISONE 20 MG PO TABS: ORAL_TABLET | ORAL | Status: DC

## 2015-10-08 MED ORDER — CYCLOBENZAPRINE HCL 10 MG PO TABS: 10.0000 mg | ORAL_TABLET | Freq: Three times a day (TID) | ORAL | Status: DC | PRN

## 2015-10-08 NOTE — Progress Notes (Signed)
BP 131/81 mmHg  Pulse 83  Temp(Src) 98.9 F (37.2 C) (Oral)  Ht   Wt    Subjective:    Patient ID: [name] [name], male    DOB: [birth year], 75 y.o.   MRN: [id number]  HPI: TAHJE BORAWSKI is a 75 y.o. male presenting on 10/08/2015 for Right hip pain   HPI Right back pain radiating down into his right leg Patient has right back pain radiating down into his right hip and down his right leg all the way to his foot. He also complains of weakness down in that leg as well. He has had 2 previous surgeries on his lower back and has gotten injections previously. He has seen Dr. Nelva Bush most recently for the injections. He says this started 3 days ago when he was working in the yard a lot and lifting things up and moving things around and then that night and the next day he started having a lot of pain going all the way down into his leg. He is also developed some weakness going down into that leg as well along with this. He denies any fevers or chills or overlying skin changes. He denies any loss of bladder or loss of bowels. Patient is normally up and active and moving around in the yard and working that now he can't even walk and has to be in a wheelchair to come in.  Relevant past medical, surgical, family and social history reviewed and updated as indicated. Interim medical history since our last visit reviewed. Allergies and medications reviewed and updated.  Review of Systems  Constitutional: Negative for fever.  HENT: Negative for ear discharge and ear pain.   Eyes: Negative for discharge and visual disturbance.  Respiratory: Negative for shortness of breath and wheezing.   Cardiovascular: Negative for chest pain and leg swelling.  Gastrointestinal: Negative for abdominal pain, diarrhea and constipation.  Genitourinary: Negative for difficulty urinating.  Musculoskeletal: Positive for myalgias and back pain. Negative for gait problem.  Skin: Negative for rash.  Neurological:  Positive for weakness. Negative for syncope, light-headedness, numbness and headaches.  All other systems reviewed and are negative.   Per HPI unless specifically indicated above     Medication List       This list is accurate as of: 10/08/15  3:41 PM.  Always use your most recent med list.               amLODipine 5 MG tablet  Commonly known as:  NORVASC  Take 1 tablet (5 mg total) by mouth daily.     aspirin 325 MG EC tablet  Take 325 mg by mouth daily.     atorvastatin 40 MG tablet  Commonly known as:  LIPITOR  TAKE ONE TABLET BY MOUTH ONE TIME DAILY     cholecalciferol 1000 units tablet  Commonly known as:  VITAMIN D  Take 1 tablet (1,000 Units total) by mouth daily.     cyclobenzaprine 10 MG tablet  Commonly known as:  FLEXERIL  Take 1 tablet (10 mg total) by mouth 3 (three) times daily as needed for muscle spasms.     Fish Oil 1200 MG Caps  Take 2 capsules by mouth daily.     levothyroxine 75 MCG tablet  Commonly known as:  SYNTHROID, LEVOTHROID  Take 1 tablet (75 mcg total) by mouth daily.     lisinopril 40 MG tablet  Commonly known as:  PRINIVIL,ZESTRIL  Take 1 tablet (40 mg total)  by mouth daily.     ONE-A-DAY MENS PO  Take 1 tablet by mouth daily.     predniSONE 20 MG tablet  Commonly known as:  DELTASONE  2 po at same time daily for 5 days           Objective:    BP 131/81 mmHg  Pulse 83  Temp(Src) 98.9 F (37.2 C) (Oral)  Ht   Wt   Wt Readings from Last 3 Encounters:  10/04/15 183 lb 3.2 oz (83.099 kg)  09/14/15 185 lb (83.915 kg)  07/23/15 183 lb 8 oz (83.235 kg)    Physical Exam  Constitutional: He is oriented to person, place, and time. He appears well-developed and well-nourished. No distress.  Eyes: Conjunctivae and EOM are normal. Pupils are equal, round, and reactive to light. Right eye exhibits no discharge. No scleral icterus.  Cardiovascular: Normal rate, regular rhythm, normal heart sounds and intact distal pulses.   No  murmur heard. Pulmonary/Chest: Effort normal and breath sounds normal. No respiratory distress. He has no wheezes.  Musculoskeletal: Normal range of motion. He exhibits no edema.       Lumbar back: He exhibits tenderness (Right lumbar paraspinal, positive straight leg raise), pain and spasm. He exhibits normal range of motion, no swelling, no edema, no deformity and no laceration.  Neurological: He is alert and oriented to person, place, and time. Coordination normal.  Skin: Skin is warm and dry. No rash noted. He is not diaphoretic.  Psychiatric: He has a normal mood and affect. His behavior is normal.  Vitals reviewed.   Results for orders placed or performed in visit on 10/04/15  CMP14+EGFR  Result Value Ref Range   Glucose 106 (H) 65 - 99 mg/dL   BUN 17 8 - 27 mg/dL   Creatinine, Ser 1.08 0.76 - 1.27 mg/dL   GFR calc non Af Amer 67 >59 mL/min/1.73   GFR calc Af Amer 77 >59 mL/min/1.73   BUN/Creatinine Ratio 16 10 - 24   Sodium 144 134 - 144 mmol/L   Potassium 4.7 3.5 - 5.2 mmol/L   Chloride 103 96 - 106 mmol/L   CO2 26 18 - 29 mmol/L   Calcium 9.5 8.6 - 10.2 mg/dL   Total Protein 7.1 6.0 - 8.5 g/dL   Albumin 4.6 3.5 - 4.8 g/dL   Globulin, Total 2.5 1.5 - 4.5 g/dL   Albumin/Globulin Ratio 1.8 1.2 - 2.2   Bilirubin Total 0.5 0.0 - 1.2 mg/dL   Alkaline Phosphatase 61 39 - 117 IU/L   AST 49 (H) 0 - 40 IU/L   ALT 54 (H) 0 - 44 IU/L  Lipid panel  Result Value Ref Range   Cholesterol, Total 99 (L) 100 - 199 mg/dL   Triglycerides 110 0 - 149 mg/dL   HDL 25 (L) >39 mg/dL   VLDL Cholesterol Cal 22 5 - 40 mg/dL   LDL Calculated 52 0 - 99 mg/dL   Chol/HDL Ratio 4.0 0.0 - 5.0 ratio units  Thyroid Panel With TSH  Result Value Ref Range   TSH 3.920 0.450 - 4.500 uIU/mL   T4, Total 5.7 4.5 - 12.0 ug/dL   T3 Uptake Ratio 26 24 - 39 %   Free Thyroxine Index 1.5 1.2 - 4.9  VITAMIN D 25 Hydroxy (Vit-D Deficiency, Fractures)  Result Value Ref Range   Vit D, 25-Hydroxy 42.8 30.0 - 100.0  ng/mL      Assessment & Plan:   Problem List Items Addressed This  Visit    None    Visit Diagnoses    Lumbar back pain with radiculopathy affecting right lower extremity    -  Primary    Relevant Medications    predniSONE (DELTASONE) 20 MG tablet    cyclobenzaprine (FLEXERIL) 10 MG tablet    Other Relevant Orders    Ambulatory referral to Orthopedic Surgery        Follow up plan: Return if symptoms worsen or fail to improve.  Counseling provided for all of the vaccine components Orders Placed This Encounter  Procedures  . Ambulatory referral to [place] Abdoulie Tierce, MD [facility] 10/08/2015, 3:41 PM

## 2015-10-17 DIAGNOSIS — M5136 Other intervertebral disc degeneration, lumbar region: Secondary | ICD-10-CM | POA: Diagnosis not present

## 2015-10-17 DIAGNOSIS — M5416 Radiculopathy, lumbar region: Secondary | ICD-10-CM | POA: Diagnosis not present

## 2015-10-31 DIAGNOSIS — M5416 Radiculopathy, lumbar region: Secondary | ICD-10-CM | POA: Diagnosis not present

## 2015-11-06 DIAGNOSIS — M5416 Radiculopathy, lumbar region: Secondary | ICD-10-CM | POA: Diagnosis not present

## 2015-11-06 DIAGNOSIS — M961 Postlaminectomy syndrome, not elsewhere classified: Secondary | ICD-10-CM | POA: Diagnosis not present

## 2015-11-06 DIAGNOSIS — M5136 Other intervertebral disc degeneration, lumbar region: Secondary | ICD-10-CM | POA: Diagnosis not present

## 2015-11-23 ENCOUNTER — Encounter: Payer: Self-pay | Admitting: *Deleted

## 2015-11-30 ENCOUNTER — Other Ambulatory Visit: Payer: Self-pay | Admitting: Nurse Practitioner

## 2015-12-17 ENCOUNTER — Encounter: Payer: Self-pay | Admitting: Cardiovascular Disease

## 2015-12-17 ENCOUNTER — Ambulatory Visit (INDEPENDENT_AMBULATORY_CARE_PROVIDER_SITE_OTHER): Payer: Medicare Other | Admitting: Cardiovascular Disease

## 2015-12-17 VITALS — BP 122/66 | HR 62 | Ht 71.0 in | Wt 180.0 lb

## 2015-12-17 DIAGNOSIS — I739 Peripheral vascular disease, unspecified: Secondary | ICD-10-CM

## 2015-12-17 DIAGNOSIS — E785 Hyperlipidemia, unspecified: Secondary | ICD-10-CM | POA: Diagnosis not present

## 2015-12-17 DIAGNOSIS — I350 Nonrheumatic aortic (valve) stenosis: Secondary | ICD-10-CM

## 2015-12-17 DIAGNOSIS — Z9889 Other specified postprocedural states: Secondary | ICD-10-CM

## 2015-12-17 DIAGNOSIS — I10 Essential (primary) hypertension: Secondary | ICD-10-CM | POA: Diagnosis not present

## 2015-12-17 DIAGNOSIS — Z8679 Personal history of other diseases of the circulatory system: Secondary | ICD-10-CM

## 2015-12-17 NOTE — Progress Notes (Signed)
Patient ID: JONTAVIUS RABALAIS, male   DOB: [birth year], 75 y.o.   MRN: [id number]      SUBJECTIVE: The patient presents for follow-up of severe aortic stenosis. He also has a history of hypertension, hyperlipidemia, nonobstructive carotid artery disease, COPD, AAA s/p endovascular repair, and hypothyroidism.  He continues to feel well and remains very active and denies chest pain, shortness of breath, lightheadedness, dizziness, leg swelling, and syncope. He continues to stay active around the house doing gardening.  Had back issues 8 weeks ago which have since resolved.  Echocardiogram 09/19/15 showed vigorous left ventricular systolic function, EF 55-73%, grade 1 diastolic dysfunction, severe aortic stenosis, mean gradient 68 mmHg, mild to moderate aortic regurgitation, and mild mitral regurgitation.   Review of Systems: As per "subjective", otherwise negative.  No Known Allergies  Current Outpatient Prescriptions  Medication Sig Dispense Refill  . amLODipine (NORVASC) 5 MG tablet Take 1 tablet (5 mg total) by mouth daily. 30 tablet 5  . aspirin 325 MG EC tablet Take 325 mg by mouth daily.      Marland Kitchen atorvastatin (LIPITOR) 40 MG tablet TAKE 1 TABLET ONCE A DAY 30 tablet 0  . cholecalciferol (VITAMIN D) 1000 UNITS tablet Take 1 tablet (1,000 Units total) by mouth daily. 30 tablet 6  . levothyroxine (SYNTHROID, LEVOTHROID) 75 MCG tablet TAKE 1 TABLET ONCE A DAY 30 tablet 0  . lisinopril (PRINIVIL,ZESTRIL) 40 MG tablet TAKE 1 TABLET ONCE A DAY 30 tablet 0  . Multiple Vitamin (ONE-A-DAY MENS PO) Take 1 tablet by mouth daily.      . Omega-3 Fatty Acids (FISH OIL) 1200 MG CAPS Take 2 capsules by mouth daily.      No current facility-administered medications for this visit.    Past Medical History  Diagnosis Date  . Arthritis   . Hyperlipidemia   . Hypertension   . Joint pain   . Weight gain   . AAA (abdominal aortic aneurysm) ([place])   . Cancer [facility]) 2006 and Nov. 9, 2013     [facility], right  shoulder  . Atherosclerotic peripheral vascular disease ([place])   . AAA (abdominal aortic aneurysm) ([place])   . COPD (chronic obstructive pulmonary disease) ([place])   . Thyroid disease     hypothyroid  . Bilateral carotid artery stenosis   . Vitamin D deficiency   . Skin cancer     Past Surgical History  Procedure Laterality Date  . Back surgery    . Hydrocele excision / repair    . Abdominal aortic aneurysm repair  08-09-09  . Spine surgery    . Skin cancer excision      Social History   Social History  . Marital Status: Married    Spouse Name: N/A  . Number of Children: N/A  . Years of Education: N/A   Occupational History  . 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 Smoking status: Former Smoker -- 1.00 packs/day for 51 years    Types: Cigarettes    Start date: 11/25/1954    Quit date: 10/27/2005  . Smokeless tobacco: Never Used  . Alcohol Use: No     Comment: none since 1999  . Drug Use: No  . Sexual Activity: Yes   Other Topics Concern  . Not on file   Social History Narrative     Filed Vitals:   12/17/15 1050  BP: 122/66  Pulse: 62  Height: '5\' 11"'$  (1.803 m)  Weight: 180 lb (81.647 kg)    PHYSICAL EXAM General: NAD Neck: No JVD, no thyromegaly or thyroid nodule.  Lungs: Diminished but clear with normal respiratory effort. CV: Nondisplaced PMI. Regular rate and rhythm, normal S1/diminished S2, no S3/S4, late-peaking IV/VI ejection systolic murmur. No peripheral edema.  Abdomen: Soft, nontender, no distention.  Skin: Intact without lesions or rashes.  Neurologic: Alert and oriented x 3.  Psych: Normal affect. Extremities: No clubbing or cyanosis.  HEENT: Normal.    ECG: Most recent ECG reviewed.      ASSESSMENT AND PLAN: 1. Severe aortic stenosis: Symptomatically stable. Echo reviewed above. I previously had an extensive discussion with him about the natural history of aortic stenosis. I informed him  of symptoms he would likely experience in the future including chest pain, shortness of breath, lightheadedness, dizziness, leg swelling, fatigue, and possibly syncope. I asked him to call me immediately should he experience any of these symptoms as he will eventually require aortic valve replacement. The patient is understanding and in agreement with this plan. Will likely obtain echo at time of next visit.  2. Essential HTN: Controlled on amlodipine 5 mg and lisinopril 40 mg in context of severe AS. No changes.  3. Hyperlipidemia: On high-intensity statin therapy with Lipitor 40 mg.  4. PVD: Followed by vascular surgery.  Dispo: f/u 3 months.   Kate Sable, M.D., F.A.C.C.

## 2015-12-17 NOTE — Patient Instructions (Signed)
Medication Instructions:   Your physician recommends that you continue on your current medications as directed. Please refer to the Current Medication list given to you today.  Labwork:  NONE  Testing/Procedures:  NONE  Follow-Up:  Your physician recommends that you schedule a follow-up appointment in: 3 months.  Any Other Special Instructions Will Be Listed Below (If Applicable).  If you need a refill on your cardiac medications before your next appointment, please call your pharmacy. 

## 2015-12-31 ENCOUNTER — Other Ambulatory Visit: Payer: Self-pay | Admitting: Nurse Practitioner

## 2016-01-02 ENCOUNTER — Encounter: Payer: Self-pay | Admitting: Nurse Practitioner

## 2016-01-02 ENCOUNTER — Ambulatory Visit (INDEPENDENT_AMBULATORY_CARE_PROVIDER_SITE_OTHER): Payer: Medicare Other | Admitting: Nurse Practitioner

## 2016-01-02 VITALS — BP 149/76 | HR 63 | Temp 96.9°F | Ht 71.0 in | Wt 179.0 lb

## 2016-01-02 DIAGNOSIS — Z6826 Body mass index (BMI) 26.0-26.9, adult: Secondary | ICD-10-CM | POA: Diagnosis not present

## 2016-01-02 DIAGNOSIS — E559 Vitamin D deficiency, unspecified: Secondary | ICD-10-CM

## 2016-01-02 DIAGNOSIS — E785 Hyperlipidemia, unspecified: Secondary | ICD-10-CM

## 2016-01-02 DIAGNOSIS — I6523 Occlusion and stenosis of bilateral carotid arteries: Secondary | ICD-10-CM | POA: Diagnosis not present

## 2016-01-02 DIAGNOSIS — E038 Other specified hypothyroidism: Secondary | ICD-10-CM | POA: Diagnosis not present

## 2016-01-02 DIAGNOSIS — I350 Nonrheumatic aortic (valve) stenosis: Secondary | ICD-10-CM

## 2016-01-02 DIAGNOSIS — E034 Atrophy of thyroid (acquired): Secondary | ICD-10-CM

## 2016-01-02 DIAGNOSIS — I10 Essential (primary) hypertension: Secondary | ICD-10-CM | POA: Diagnosis not present

## 2016-01-02 LAB — CMP14+EGFR
A/G RATIO: 1.7 (ref 1.2–2.2)
ALBUMIN: 4.5 g/dL (ref 3.5–4.8)
ALK PHOS: 64 IU/L (ref 39–117)
ALT: 39 IU/L (ref 0–44)
AST: 36 IU/L (ref 0–40)
BUN / CREAT RATIO: 22 (ref 10–24)
BUN: 20 mg/dL (ref 8–27)
Bilirubin Total: 0.6 mg/dL (ref 0.0–1.2)
CO2: 20 mmol/L (ref 18–29)
Calcium: 9.4 mg/dL (ref 8.6–10.2)
Chloride: 106 mmol/L (ref 96–106)
Creatinine, Ser: 0.92 mg/dL (ref 0.76–1.27)
GFR calc Af Amer: 93 mL/min/{1.73_m2} (ref >59)
GFR calc non Af Amer: 81 mL/min/{1.73_m2} (ref >59)
GLOBULIN, TOTAL: 2.6 g/dL (ref 1.5–4.5)
Glucose: 106 mg/dL — ABNORMAL HIGH (ref 65–99)
POTASSIUM: 4.2 mmol/L (ref 3.5–5.2)
SODIUM: 146 mmol/L — AB (ref 134–144)
Total Protein: 7.1 g/dL (ref 6.0–8.5)

## 2016-01-02 LAB — LIPID PANEL
CHOL/HDL RATIO: 3.5 ratio (ref 0.0–5.0)
CHOLESTEROL TOTAL: 91 mg/dL — AB (ref 100–199)
HDL: 26 mg/dL — ABNORMAL LOW (ref >39)
LDL CALC: 36 mg/dL (ref 0–99)
TRIGLYCERIDES: 145 mg/dL (ref 0–149)
VLDL Cholesterol Cal: 29 mg/dL (ref 5–40)

## 2016-01-02 MED ORDER — LEVOTHYROXINE SODIUM 75 MCG PO TABS: 75.0000 ug | ORAL_TABLET | Freq: Every day | ORAL | Status: DC

## 2016-01-02 MED ORDER — LISINOPRIL 40 MG PO TABS: 40.0000 mg | ORAL_TABLET | Freq: Every day | ORAL | Status: DC

## 2016-01-02 MED ORDER — AMLODIPINE BESYLATE 5 MG PO TABS: 5.0000 mg | ORAL_TABLET | Freq: Every day | ORAL | Status: DC

## 2016-01-02 MED ORDER — ATORVASTATIN CALCIUM 40 MG PO TABS: 40.0000 mg | ORAL_TABLET | Freq: Every day | ORAL | Status: DC

## 2016-01-02 NOTE — Patient Instructions (Signed)
Carotid Artery Disease The carotid arteries are the two main arteries on either side of the neck that supply blood to the brain. Carotid artery disease, also called carotid artery stenosis, is the narrowing or blockage of one or both carotid arteries. Carotid artery disease increases your risk for a stroke or a transient ischemic attack (TIA). A TIA is an episode in which a waxy, fatty substance that accumulates within the artery (plaque) blocks blood flow to the brain. A TIA is considered a "warning stroke."  CAUSES   Buildup of plaque inside the carotid arteries (atherosclerosis) (common).  A weakened outpouching in an artery (aneurysm).  Inflammation of the carotid artery (arteritis).  A fibrous growth within the carotid artery (fibromuscular dysplasia).  Tissue death within the carotid artery due to radiation treatment (post-radiation necrosis).  Decreased blood flow due to spasms of the carotid artery (vasospasm).  Separation of the walls of the carotid artery (carotid dissection). RISK FACTORS  High cholesterol (dyslipidemia).   High blood pressure (hypertension).   Smoking.   Obesity.   Diabetes.   Family history of cardiovascular disease.   Inactivity or lack of regular exercise.   Being male. Men have an increased risk of developing atherosclerosis earlier in life than women.  SYMPTOMS  Carotid artery disease does not cause symptoms. DIAGNOSIS Diagnosis of carotid artery disease may include:   A physical exam. Your health care provider may hear an abnormal sound (bruit) when listening to the carotid arteries.   Specific tests that look at the blood flow in the carotid arteries. These tests include:   Carotid artery ultrasonography.   Carotid or cerebral angiography.   Computerized tomographic angiography (CTA).   Magnetic resonance angiography (MRA).  TREATMENT  Treatment of carotid artery disease can include a combination of treatments.  Treatment options include:  Surgery. You may have:   A carotid endarterectomy. This is a surgery to remove the blockages in the carotid arteries.   A carotid angioplasty with stenting. This is a nonsurgical interventional procedure. A wire mesh (stent) is used to widen the blocked carotid arteries.   Medicines to control blood pressure, cholesterol, and reduce blood clotting (antiplatelet therapy).   Adjusting your diet.   Lifestyle changes such as:   Quitting smoking.   Exercising as tolerated or as directed by your health care provider.   Controlling and maintaining a good blood pressure.   Keeping cholesterol levels under control.  HOME CARE INSTRUCTIONS   Take medicines only as directed by your health care provider. Make sure you understand all your medicine instructions. Do not stop your medicines without talking to your health care provider.   Follow your health care provider's diet instructions. It is important to eat a healthy diet that is low in saturated fats and includes plenty of fresh fruits, vegetables, and lean meats. High-fat, high-sodium foods as well as foods that are fried, overly processed, or have poor nutritional value should be avoided.  Maintain a healthy weight.   Stay physically active. It is recommended that you get at least 30 minutes of activity every day.   Do not use any tobacco products including cigarettes, chewing tobacco, or electronic cigarettes. If you need help quitting, ask your health care provider.  Limit alcohol use to:   No more than 2 drinks per day for men.   No more than 1 drink per day for nonpregnant women.   Do not use illegal drugs.   Keep all follow-up visits as directed by your health  care provider.  SEEK IMMEDIATE MEDICAL CARE IF:  You develop TIA or stroke symptoms. These include:   Sudden weakness or numbness on one side of the body, such as in the face, arm, or leg.   Sudden confusion.    Trouble speaking (aphasia) or understanding.   Sudden trouble seeing out of one or both eyes.   Sudden trouble walking.   Dizziness or feeling like you might faint.   Loss of balance or coordination.   Sudden severe headache with no known cause.   Sudden trouble swallowing (dysphagia).  If you have any of these symptoms, call your local emergency services (911 in [place] Do not drive yourself to the clinic or hospital. This is a medical emergency.    This information is not intended to replace advice given to you by your health care provider. Make sure you discuss any questions you have with your health care provider.   Document Released: 09/01/2011 Document Revised: 06/30/2014 Document Reviewed: 12/08/2012 Elsevier Interactive Patient Education [employer].

## 2016-01-02 NOTE — Progress Notes (Signed)
Subjective:    Patient ID: [name] [name], male    DOB: 06-29-1940, 75 y.o.   MRN: [id number]  Patient here today for follow up of chronic medical problems.  Outpatient Encounter Prescriptions as of 01/02/2016  Medication Sig  . amLODipine (NORVASC) 5 MG tablet Take 1 tablet (5 mg total) by mouth daily.  Marland Kitchen aspirin 325 MG EC tablet Take 325 mg by mouth daily.    Marland Kitchen atorvastatin (LIPITOR) 40 MG tablet TAKE 1 TABLET ONCE A DAY  . cholecalciferol (VITAMIN D) 1000 UNITS tablet Take 1 tablet (1,000 Units total) by mouth daily.  Marland Kitchen levothyroxine (SYNTHROID, LEVOTHROID) 75 MCG tablet TAKE 1 TABLET ONCE A DAY  . lisinopril (PRINIVIL,ZESTRIL) 40 MG tablet TAKE 1 TABLET ONCE A DAY  . Multiple Vitamin (ONE-A-DAY MENS PO) Take 1 tablet by mouth daily.    . Omega-3 Fatty Acids (FISH OIL) 1200 MG CAPS Take 2 capsules by mouth daily.    No facility-administered encounter medications on file as of 01/02/2016.     Hypertension This is a chronic problem. The current episode started more than 1 year ago. The problem has been waxing and waning since onset. The problem is controlled. Pertinent negatives include no blurred vision, chest pain, headaches, neck pain, palpitations or shortness of breath. Agents associated with hypertension include thyroid hormones. Risk factors for coronary artery disease include dyslipidemia, family history, male gender and sedentary lifestyle. Past treatments include calcium channel blockers and ACE inhibitors. Compliance problems include exercise.  Hypertensive end-organ damage includes a thyroid problem.  Hyperlipidemia This is a chronic problem. The current episode started more than 1 year ago. Recent lipid tests were reviewed and are variable. Exacerbating diseases include hypothyroidism. Pertinent negatives include no chest pain or shortness of breath. Current antihyperlipidemic treatment includes statins. Compliance problems include adherence to exercise.  Risk factors for  coronary artery disease include dyslipidemia, hypertension, male sex and a sedentary lifestyle.  Thyroid Problem Presents for follow-up visit. Patient reports no anxiety, cold intolerance, dry skin or palpitations. The symptoms have been stable. Past treatments include levothyroxine. His past medical history is significant for hyperlipidemia.  vitamin D def  Vitamin d OTC daily -no side effects AAA repair Doing well and sees cardiology every 6 months, was just seen Tuesday of this week..    Review of Systems  Constitutional: Negative.   HENT: Positive for congestion and sinus pressure.   Eyes: Positive for discharge and redness. Negative for blurred vision.  Respiratory: Positive for cough (Productive of moderate amount of white milky substance. ). Negative for chest tightness and shortness of breath.   Cardiovascular: Negative for chest pain and palpitations.  Endocrine: Negative for cold intolerance.  Genitourinary:       Denies urinary in continence  Musculoskeletal: Negative for neck pain.  Neurological: Negative for headaches.  Psychiatric/Behavioral: The patient is not nervous/anxious.   All other systems reviewed and are negative.      Objective:   Physical Exam  Constitutional: He is oriented to person, place, and time. He appears well-developed and well-nourished.  HENT:  Head: Normocephalic.  Right Ear: Hearing, tympanic membrane, external ear and ear canal normal.  Left Ear: Hearing, tympanic membrane, external ear and ear canal normal.  Nose: Right sinus exhibits no frontal sinus tenderness. Left sinus exhibits no frontal sinus tenderness.  Mouth/Throat: Uvula is midline, oropharynx is clear and moist and mucous membranes are normal.  Eyes: EOM are normal. Pupils are equal, round, and reactive to light.  Neck:  Normal range of motion. Neck supple. No JVD present. No thyromegaly present.  Cardiovascular: Normal rate, regular rhythm and intact distal pulses.  Exam reveals  no gallop and no friction rub.   Murmur (3/6 systolic) heard. 3/6 bil carotid bruits  Pulmonary/Chest: Effort normal and breath sounds normal. No respiratory distress. He has no wheezes. He has no rales. He exhibits no tenderness.  Abdominal: Soft. Bowel sounds are normal. He exhibits no mass. There is no tenderness.  Umbilical hernia  Genitourinary: Penis normal.  Refuses digital rectal exam   Musculoskeletal: Normal range of motion. He exhibits no edema.  Lymphadenopathy:    He has no cervical adenopathy.  Neurological: He is alert and oriented to person, place, and time. No cranial nerve deficit.  Skin: Skin is warm and dry.  Psychiatric: He has a normal mood and affect. His behavior is normal. Judgment and thought content normal.   BP 149/76 mmHg  Pulse 63  Temp(Src) 96.9 F (36.1 C) (Oral)  Ht 5' 11"  (1.803 m)  Wt 179 lb (81.194 kg)  BMI 24.98 kg/m2      Assessment & Plan:   1. Aortic stenosis Will watch  2. Carotid stenosis, bilateral Will continue to monitor Scheduled to have u/s in 3 months- by cardiologist  3. Essential hypertension Do not add salt to diet - amLODipine (NORVASC) 5 MG tablet; Take 1 tablet (5 mg total) by mouth daily.  Dispense: 30 tablet; Refill: 5 - CMP14+EGFR  4. Hypothyroidism due to acquired atrophy of thyroid - levothyroxine (SYNTHROID, LEVOTHROID) 75 MCG tablet; Take 1 tablet (75 mcg total) by mouth daily.  Dispense: 30 tablet; Refill: 8  5. HLD (hyperlipidemia) - lisinopril (PRINIVIL,ZESTRIL) 40 MG tablet; Take 1 tablet (40 mg total) by mouth daily.  Dispense: 30 tablet; Refill: 5 - atorvastatin (LIPITOR) 40 MG tablet; Take 1 tablet (40 mg total) by mouth daily.  Dispense: 30 tablet; Refill: 5 - Lipid panel  6. BMI 26.0-26.9,adult Discussed diet and exercise for person with BMI >25 Will recheck weight in 3-6 months  7. Vitamin D deficiency    Labs pending Health maintenance reviewed Diet and exercise encouraged Continue all  meds Follow up  In 3 month   [place], FNP

## 2016-01-04 ENCOUNTER — Ambulatory Visit: Payer: Medicare Other | Admitting: Nurse Practitioner

## 2016-02-01 ENCOUNTER — Encounter: Payer: Self-pay | Admitting: Family

## 2016-02-01 ENCOUNTER — Ambulatory Visit (INDEPENDENT_AMBULATORY_CARE_PROVIDER_SITE_OTHER): Payer: Medicare Other | Admitting: Family

## 2016-02-01 VITALS — BP 136/75 | HR 98 | Temp 97.1°F | Ht 71.0 in | Wt 175.5 lb

## 2016-02-01 DIAGNOSIS — N3001 Acute cystitis with hematuria: Secondary | ICD-10-CM

## 2016-02-01 DIAGNOSIS — R319 Hematuria, unspecified: Secondary | ICD-10-CM | POA: Diagnosis not present

## 2016-02-01 LAB — URINALYSIS, COMPLETE
BILIRUBIN UA: NEGATIVE
Glucose, UA: NEGATIVE
Nitrite, UA: POSITIVE — AB
PH UA: 7 (ref 5.0–7.5)
SPEC GRAV UA: 1.015 (ref 1.005–1.030)
UUROB: 2 mg/dL — AB (ref 0.2–1.0)

## 2016-02-01 LAB — MICROSCOPIC EXAMINATION: EPITHELIAL CELLS (NON RENAL): NONE SEEN /HPF (ref 0–10)

## 2016-02-01 LAB — FINGERSTICK HEMOGLOBIN: HEMOGLOBIN: 13.9 g/dL (ref 12.6–17.7)

## 2016-02-01 MED ORDER — CEFTRIAXONE SODIUM 1 G IJ SOLR
1.0000 g | Freq: Once | INTRAMUSCULAR | Status: AC
  Administered 2016-02-01: 1 g via INTRAMUSCULAR

## 2016-02-01 MED ORDER — CIPROFLOXACIN HCL 500 MG PO TABS: 500.0000 mg | ORAL_TABLET | Freq: Two times a day (BID) | ORAL | 0 refills | Status: DC

## 2016-02-01 NOTE — Patient Instructions (Signed)

## 2016-02-01 NOTE — Progress Notes (Signed)
   Subjective:    Patient ID: [name] [name], male    DOB: 10-Feb-1941, 75 y.o.   MRN: [id number]  Hematuria  This is a new problem. The current episode started today. He describes the hematuria as gross hematuria. His pain is at a severity of 2/10. He is experiencing no pain. Irritative symptoms include frequency and nocturia. Irritative symptoms do not include urgency. Obstructive symptoms include an intermittent stream. Obstructive symptoms do not include dribbling or straining. Associated symptoms include dysuria. Pertinent negatives include no chills, flank pain, hesitancy, inability to urinate, nausea or vomiting. There is no history of kidney stones, prostatitis or STDs.      Review of Systems  Constitutional: Negative for chills.  Gastrointestinal: Negative for nausea and vomiting.  Genitourinary: Positive for dysuria, frequency, hematuria and nocturia. Negative for flank pain, hesitancy and urgency.  All other systems reviewed and are negative.      Objective:   Physical Exam  Constitutional: He is oriented to person, place, and time. He appears well-developed and well-nourished. No distress.  HENT:  Head: Normocephalic.  Neck: No thyromegaly present.  Cardiovascular: Normal rate, regular rhythm, normal heart sounds and intact distal pulses.   No murmur heard. Pulmonary/Chest: Effort normal and breath sounds normal. No respiratory distress. He has no wheezes.  Abdominal: Soft. Bowel sounds are normal. He exhibits no distension. There is tenderness (mild lower abd pain).  Musculoskeletal: Normal range of motion. He exhibits no edema or tenderness.  Neurological: He is alert and oriented to person, place, and time.  Skin: Skin is warm and dry. No rash noted. No erythema.  Psychiatric: He has a normal mood and affect. His behavior is normal. Judgment and thought content normal.  Vitals reviewed.     BP 136/75   Pulse 98   Temp 97.1 F (36.2 C) (Oral)   Ht '5\' 11"'$  (1.803  m)   Wt 175 lb 8 oz (79.6 kg)   BMI 24.48 kg/m      Assessment & Plan:  1. Hematuria -Hold aspirin until blood clears - Urinalysis, Complete - CBC with Differential/Platelet - Fingerstick Hemoglobin  2. Acute cystitis with hematuria -Force fluids AZO over the counter X2 days RTO 2 weeks to recheck Culture pending - Urine culture - cefTRIAXone (ROCEPHIN) injection 1 g; Inject 1 g into the muscle once. - ciprofloxacin (CIPRO) 500 MG tablet; Take 1 tablet (500 mg total) by mouth 2 (two) times daily.  Dispense: 14 tablet; Refill: 0  Evelina Dun, FNP

## 2016-02-02 LAB — CBC WITH DIFFERENTIAL/PLATELET
BASOS ABS: 0 10*3/uL (ref 0.0–0.2)
BASOS: 0 %
EOS (ABSOLUTE): 0.2 10*3/uL (ref 0.0–0.4)
Eos: 3 %
HEMATOCRIT: 41 % (ref 37.5–51.0)
HEMOGLOBIN: 14.1 g/dL (ref 12.6–17.7)
Immature Grans (Abs): 0 10*3/uL (ref 0.0–0.1)
Immature Granulocytes: 0 %
LYMPHS ABS: 2.3 10*3/uL (ref 0.7–3.1)
Lymphs: 26 %
MCH: 31.2 pg (ref 26.6–33.0)
MCHC: 34.4 g/dL (ref 31.5–35.7)
MCV: 91 fL (ref 79–97)
MONOCYTES: 7 %
Monocytes Absolute: 0.6 10*3/uL (ref 0.1–0.9)
NEUTROS ABS: 5.7 10*3/uL (ref 1.4–7.0)
Neutrophils: 64 %
Platelets: 200 10*3/uL (ref 150–379)
RBC: 4.52 x10E6/uL (ref 4.14–5.80)
RDW: 13.3 % (ref 12.3–15.4)
WBC: 8.9 10*3/uL (ref 3.4–10.8)

## 2016-02-03 LAB — URINE CULTURE

## 2016-02-12 ENCOUNTER — Ambulatory Visit (INDEPENDENT_AMBULATORY_CARE_PROVIDER_SITE_OTHER): Payer: Medicare Other | Admitting: Family

## 2016-02-12 ENCOUNTER — Encounter: Payer: Self-pay | Admitting: Family

## 2016-02-12 VITALS — BP 138/80 | HR 79 | Temp 97.2°F | Ht 71.0 in | Wt 177.4 lb

## 2016-02-12 DIAGNOSIS — N390 Urinary tract infection, site not specified: Secondary | ICD-10-CM

## 2016-02-12 LAB — URINALYSIS, COMPLETE
Bilirubin, UA: NEGATIVE
GLUCOSE, UA: NEGATIVE
KETONES UA: NEGATIVE
NITRITE UA: NEGATIVE
Specific Gravity, UA: 1.02 (ref 1.005–1.030)
UUROB: 0.2 mg/dL (ref 0.2–1.0)
pH, UA: 6.5 (ref 5.0–7.5)

## 2016-02-12 LAB — MICROSCOPIC EXAMINATION

## 2016-02-12 MED ORDER — CIPROFLOXACIN HCL 500 MG PO TABS: 500.0000 mg | ORAL_TABLET | Freq: Two times a day (BID) | ORAL | 0 refills | Status: DC

## 2016-02-12 NOTE — Patient Instructions (Signed)

## 2016-02-12 NOTE — Progress Notes (Signed)
   Subjective:    Patient ID: [name] [name], male    DOB: 05-25-1941, 75 y.o.   MRN: [id number]  HPI PT presents to the office today to rechec UTI. PT had a UTI and was given rocephin 1 g IM and cipro 5000 mg for 7 days. PT states he continues to have slight dysuria, but denies any hematuria, frequency, or urgency. Pt states he "feels like a new man" now. Pt has completed his antibiotics and continues to force fluids.    Review of Systems  Constitutional: Negative.   HENT: Negative.   Respiratory: Negative.   Cardiovascular: Negative.   Gastrointestinal: Negative.   Endocrine: Negative.   Genitourinary: Positive for dysuria.  Musculoskeletal: Negative.   Neurological: Negative.   Hematological: Negative.   Psychiatric/Behavioral: Negative.   All other systems reviewed and are negative.      Objective:   Physical Exam  Constitutional: He is oriented to person, place, and time. He appears well-developed and well-nourished. No distress.  HENT:  Head: Normocephalic.  Eyes: Pupils are equal, round, and reactive to light. Right eye exhibits no discharge. Left eye exhibits no discharge.  Neck: Normal range of motion. Neck supple. No thyromegaly present.  Cardiovascular: Normal rate, regular rhythm, normal heart sounds and intact distal pulses.   No murmur heard. Pulmonary/Chest: Effort normal and breath sounds normal. No respiratory distress. He has no wheezes.  Abdominal: Soft. Bowel sounds are normal. He exhibits no distension. There is no tenderness.  Musculoskeletal: Normal range of motion. He exhibits no edema or tenderness.  Negative for CVA tenderness   Neurological: He is alert and oriented to person, place, and time.  Skin: Skin is warm and dry. No rash noted. No erythema.  Psychiatric: He has a normal mood and affect. His behavior is normal. Judgment and thought content normal.  Vitals reviewed.   BP 138/80   Pulse 79   Temp 97.2 F (36.2 C) (Oral)   Ht '5\' 11"'$   (1.803 m)   Wt 177 lb 6.4 oz (80.5 kg)   BMI 24.74 kg/m        Assessment & Plan:  1. Recurrent urinary tract infection -Force fluids AZO over the counter X2 days RTO prn - Urinalysis, Complete - ciprofloxacin (CIPRO) 500 MG tablet; Take 1 tablet (500 mg total) by mouth 2 (two) times daily.  Dispense: 10 tablet; Refill: 0  Evelina Dun, FNP

## 2016-02-27 ENCOUNTER — Ambulatory Visit (INDEPENDENT_AMBULATORY_CARE_PROVIDER_SITE_OTHER): Payer: Medicare Other | Admitting: Family Medicine

## 2016-02-27 VITALS — BP 164/94 | HR 79 | Temp 97.5°F | Ht 71.0 in | Wt 173.4 lb

## 2016-02-27 DIAGNOSIS — N419 Inflammatory disease of prostate, unspecified: Secondary | ICD-10-CM

## 2016-02-27 DIAGNOSIS — R319 Hematuria, unspecified: Secondary | ICD-10-CM | POA: Diagnosis not present

## 2016-02-27 DIAGNOSIS — Z87440 Personal history of urinary (tract) infections: Secondary | ICD-10-CM

## 2016-02-27 LAB — URINALYSIS
BILIRUBIN UA: NEGATIVE
Glucose, UA: NEGATIVE
Ketones, UA: NEGATIVE
Leukocytes, UA: NEGATIVE
NITRITE UA: NEGATIVE
PH UA: 7 (ref 5.0–7.5)
SPEC GRAV UA: 1.025 (ref 1.005–1.030)
UUROB: 0.2 mg/dL (ref 0.2–1.0)

## 2016-02-27 MED ORDER — LEVOFLOXACIN 500 MG PO TABS: 500.0000 mg | ORAL_TABLET | Freq: Every day | ORAL | 0 refills | Status: DC

## 2016-02-27 NOTE — Progress Notes (Signed)
Subjective:  Patient ID: [name] [name], male    DOB: [birth year]  Age: 75 y.o. MRN: [id number]  CC: Hematuria (pt here today because he just finished cipro for UTI and has bilateral flank pain and hematuria again.)   HPI COLBURN ASPER presents for Symptoms are as noted above. Along with the flank pain he does have some concern for blood in the urine. The pain is rated as 3-4/10. However it seems to have increased after finishing the Cipro. It is causing some urgency and dysuria.   History Cougar has a past medical history of AAA (abdominal aortic aneurysm) ([place]); AAA (abdominal aortic aneurysm) ([place]); Arthritis; Atherosclerotic peripheral vascular disease ([place]); Bilateral carotid artery stenosis; Cancer [facility] [place]) (2006 and Nov. 9, 2013); COPD (chronic obstructive pulmonary disease) ([place]); Hyperlipidemia; Hypertension; Joint pain; Skin cancer; Thyroid disease; Vitamin D deficiency; and Weight gain.   He has a past surgical history that includes Back surgery; Hydrocele excision / repair; Abdominal aortic aneurysm repair (08-09-09); Spine surgery; and Skin cancer excision.   His family history includes Alcohol abuse in his father; COPD in his mother and sister; Cancer in his mother; Gout in his brother and brother; Heart attack in his father and son; Heart disease in his father; Hypertension in his brother; Lupus in his sister.He reports that he quit smoking about 10 years ago. His smoking use included Cigarettes. He started smoking about 61 years ago. He has a 51.00 pack-year smoking history. He has never used smokeless tobacco. He reports that he does not drink alcohol or use drugs.    ROS Review of Systems  Constitutional: Negative for chills, diaphoresis and fever.  Respiratory: Negative for cough and shortness of breath.   Cardiovascular: Negative for chest pain.  Gastrointestinal: Negative for abdominal pain.  Genitourinary: Positive for dysuria, flank pain, frequency and  testicular pain. Negative for penile pain.  Musculoskeletal: Negative for arthralgias and myalgias.  Skin: Negative for rash.  Neurological: Negative for weakness and headaches.    Objective:  BP (!) 164/94   Pulse 79   Temp 97.5 F (36.4 C) (Oral)   Ht '5\' 11"'$  (1.803 m)   Wt 173 lb 6 oz (78.6 kg)   BMI 24.18 kg/m   BP Readings from Last 3 Encounters:  02/27/16 (!) 164/94  02/12/16 138/80  02/01/16 136/75    Wt Readings from Last 3 Encounters:  02/27/16 173 lb 6 oz (78.6 kg)  02/12/16 177 lb 6.4 oz (80.5 kg)  02/01/16 175 lb 8 oz (79.6 kg)     Physical Exam  Constitutional: He appears well-developed and well-nourished.  HENT:  Head: Normocephalic and atraumatic.  Right Ear: Tympanic membrane and external ear normal. No decreased hearing is noted.  Left Ear: Tympanic membrane and external ear normal. No decreased hearing is noted.  Mouth/Throat: No oropharyngeal exudate or posterior oropharyngeal erythema.  Eyes: Pupils are equal, round, and reactive to light.  Neck: Normal range of motion. Neck supple.  Cardiovascular: Normal rate and regular rhythm.   No murmur heard. Pulmonary/Chest: Breath sounds normal. No respiratory distress.  Abdominal: Soft. Bowel sounds are normal. He exhibits no mass. There is no tenderness.  Vitals reviewed.    Lab Results  Component Value Date   WBC 8.9 02/01/2016   HGB 11.9 (L) 08/10/2009   HCT 41.0 02/01/2016   PLT 200 02/01/2016   GLUCOSE 106 (H) 01/02/2016   CHOL 91 (L) 01/02/2016   TRIG 145 01/02/2016   HDL 26 (L) 01/02/2016   LDLCALC  36 01/02/2016   ALT 39 01/02/2016   AST 36 01/02/2016   NA 146 (H) 01/02/2016   K 4.2 01/02/2016   CL 106 01/02/2016   CREATININE 0.92 01/02/2016   BUN 20 01/02/2016   CO2 20 01/02/2016   TSH 3.920 10/04/2015   PSA 2.2 12/26/2013   INR 1.19 08/09/2009    No results found.  Assessment & Plan:   Phinehas was seen today for hematuria.  Diagnoses and all orders for this  visit:  History of UTI -     Urinalysis -     Urine culture -     Ambulatory referral to Urology  Hematuria -     Urine culture -     Ambulatory referral to Urology  Other orders -     levofloxacin (LEVAQUIN) 500 MG tablet; Take 1 tablet (500 mg total) by mouth daily. For 10 days      I have discontinued Mr. Huhn's ciprofloxacin. I am also having him start on levofloxacin. Additionally, I am having him maintain his Multiple Vitamin (ONE-A-DAY MENS PO), Fish Oil, cholecalciferol, amLODipine, lisinopril, levothyroxine, and atorvastatin.  Meds ordered this encounter  Medications  . levofloxacin (LEVAQUIN) 500 MG tablet    Sig: Take 1 tablet (500 mg total) by mouth daily. For 10 days    Dispense:  10 tablet    Refill:  0     Follow-up: Return if symptoms worsen or fail to improve.  Claretta Fraise, M.D.

## 2016-02-29 LAB — URINE CULTURE

## 2016-03-04 ENCOUNTER — Encounter: Payer: Self-pay | Admitting: Family Medicine

## 2016-03-04 DIAGNOSIS — N419 Inflammatory disease of prostate, unspecified: Secondary | ICD-10-CM | POA: Insufficient documentation

## 2016-03-13 ENCOUNTER — Ambulatory Visit: Payer: Medicare Other | Admitting: Cardiovascular Disease

## 2016-04-09 ENCOUNTER — Other Ambulatory Visit (HOSPITAL_COMMUNITY)
Admission: RE | Admit: 2016-04-09 | Discharge: 2016-04-09 | Disposition: A | Discharge status: Home / Self Care | Payer: Medicare Other | Source: Ambulatory Visit | Attending: Urology | Admitting: Urology

## 2016-04-09 ENCOUNTER — Ambulatory Visit (INDEPENDENT_AMBULATORY_CARE_PROVIDER_SITE_OTHER): Payer: Medicare Other | Admitting: Urology

## 2016-04-09 DIAGNOSIS — N401 Enlarged prostate with lower urinary tract symptoms: Secondary | ICD-10-CM | POA: Diagnosis not present

## 2016-04-09 DIAGNOSIS — R310 Gross hematuria: Secondary | ICD-10-CM | POA: Diagnosis not present

## 2016-04-09 DIAGNOSIS — R351 Nocturia: Secondary | ICD-10-CM | POA: Diagnosis not present

## 2016-04-09 DIAGNOSIS — R319 Hematuria, unspecified: Secondary | ICD-10-CM | POA: Insufficient documentation

## 2016-04-09 LAB — URINALYSIS, ROUTINE W REFLEX MICROSCOPIC
BILIRUBIN URINE: NEGATIVE
GLUCOSE, UA: NEGATIVE mg/dL
Hgb urine dipstick: NEGATIVE
Ketones, ur: NEGATIVE mg/dL
Leukocytes, UA: NEGATIVE
NITRITE: NEGATIVE
PH: 6 (ref 5.0–8.0)
Protein, ur: 30 mg/dL — AB
SPECIFIC GRAVITY, URINE: 1.02 (ref 1.005–1.030)

## 2016-04-09 LAB — URINE MICROSCOPIC-ADD ON: RBC / HPF: NONE SEEN RBC/hpf (ref 0–5)

## 2016-04-14 ENCOUNTER — Ambulatory Visit (INDEPENDENT_AMBULATORY_CARE_PROVIDER_SITE_OTHER): Payer: Medicare Other | Admitting: Nurse Practitioner

## 2016-04-14 ENCOUNTER — Encounter: Payer: Self-pay | Admitting: Nurse Practitioner

## 2016-04-14 VITALS — BP 136/82 | HR 91 | Temp 97.5°F | Ht 71.0 in | Wt 169.0 lb

## 2016-04-14 DIAGNOSIS — E559 Vitamin D deficiency, unspecified: Secondary | ICD-10-CM

## 2016-04-14 DIAGNOSIS — Z6826 Body mass index (BMI) 26.0-26.9, adult: Secondary | ICD-10-CM

## 2016-04-14 DIAGNOSIS — E782 Mixed hyperlipidemia: Secondary | ICD-10-CM | POA: Diagnosis not present

## 2016-04-14 DIAGNOSIS — Z9889 Other specified postprocedural states: Secondary | ICD-10-CM | POA: Diagnosis not present

## 2016-04-14 DIAGNOSIS — I10 Essential (primary) hypertension: Secondary | ICD-10-CM | POA: Diagnosis not present

## 2016-04-14 DIAGNOSIS — E034 Atrophy of thyroid (acquired): Secondary | ICD-10-CM

## 2016-04-14 DIAGNOSIS — I6523 Occlusion and stenosis of bilateral carotid arteries: Secondary | ICD-10-CM

## 2016-04-14 DIAGNOSIS — Z8679 Personal history of other diseases of the circulatory system: Secondary | ICD-10-CM

## 2016-04-14 DIAGNOSIS — E875 Hyperkalemia: Secondary | ICD-10-CM

## 2016-04-14 DIAGNOSIS — I350 Nonrheumatic aortic (valve) stenosis: Secondary | ICD-10-CM

## 2016-04-14 NOTE — Progress Notes (Signed)
Subjective:    Patient ID: [name] [name], male    DOB: 1940-07-12, 75 y.o.   MRN: [id number]  Patient here today for follow up of chronic medical problems. He is currently bing treated for UTI by [facility]- he is on bactrim- they stopped his lisinopril while on bactrim.  Outpatient Encounter Prescriptions as of 04/14/2016  Medication Sig  . alfuzosin (UROXATRAL) 10 MG 24 hr tablet   . amLODipine (NORVASC) 5 MG tablet Take 1 tablet (5 mg total) by mouth daily.  Marland Kitchen aspirin 325 MG tablet Take 325 mg by mouth daily.  Marland Kitchen atorvastatin (LIPITOR) 40 MG tablet Take 1 tablet (40 mg total) by mouth daily.  . cholecalciferol (VITAMIN D) 1000 UNITS tablet Take 1 tablet (1,000 Units total) by mouth daily.  Marland Kitchen levothyroxine (SYNTHROID, LEVOTHROID) 75 MCG tablet Take 1 tablet (75 mcg total) by mouth daily.  . Multiple Vitamin (ONE-A-DAY MENS PO) Take 1 tablet by mouth daily.    . Omega-3 Fatty Acids (FISH OIL) 1200 MG CAPS Take 2 capsules by mouth daily.   Marland Kitchen sulfamethoxazole-trimethoprim (BACTRIM DS,SEPTRA DS) 800-160 MG tablet   . lisinopril (PRINIVIL,ZESTRIL) 40 MG tablet Take 1 tablet (40 mg total) by mouth daily. (Patient not taking: Reported on 04/14/2016)    Hypertension  This is a chronic problem. The current episode started more than 1 year ago. The problem has been waxing and waning since onset. The problem is controlled. Pertinent negatives include no blurred vision, chest pain, headaches, neck pain, palpitations or shortness of breath. Agents associated with hypertension include thyroid hormones. Risk factors for coronary artery disease include dyslipidemia, family history, male gender and sedentary lifestyle. Past treatments include calcium channel blockers and ACE inhibitors. Compliance problems include exercise.  Hypertensive end-organ damage includes a thyroid problem.  Hyperlipidemia  This is a chronic problem. The current episode started more than 1 year ago. Recent lipid tests were  reviewed and are variable. Exacerbating diseases include hypothyroidism. Pertinent negatives include no chest pain or shortness of breath. Current antihyperlipidemic treatment includes statins. Compliance problems include adherence to exercise.  Risk factors for coronary artery disease include dyslipidemia, hypertension, male sex and a sedentary lifestyle.  Thyroid Problem  Presents for follow-up visit. Patient reports no anxiety, cold intolerance, dry skin or palpitations. The symptoms have been stable. Past treatments include levothyroxine. His past medical history is significant for hyperlipidemia.  vitamin D def  Vitamin d OTC daily -no side effects AAA repair Doing well and sees cardiology every 6 months, was just seen Tuesday of this week.. AOrtic stenosis / carotid stenosis Currently just watcing0- has follow up with cardiologist on October 29,2017.     Review of Systems  Constitutional: Negative.   HENT: Positive for congestion and sinus pressure.   Eyes: Positive for discharge and redness. Negative for blurred vision.  Respiratory: Positive for cough (Productive of moderate amount of white milky substance. ). Negative for chest tightness and shortness of breath.   Cardiovascular: Negative for chest pain and palpitations.  Endocrine: Negative for cold intolerance.  Genitourinary:       Denies urinary in continence  Musculoskeletal: Negative for neck pain.  Neurological: Negative for headaches.  Psychiatric/Behavioral: The patient is not nervous/anxious.   All other systems reviewed and are negative.      Objective:   Physical Exam  Constitutional: He is oriented to person, place, and time. He appears well-developed and well-nourished.  HENT:  Head: Normocephalic.  Right Ear: Hearing, tympanic membrane, external ear and  ear canal normal.  Left Ear: Hearing, tympanic membrane, external ear and ear canal normal.  Nose: Right sinus exhibits no frontal sinus tenderness. Left  sinus exhibits no frontal sinus tenderness.  Mouth/Throat: Uvula is midline, oropharynx is clear and moist and mucous membranes are normal.  Eyes: EOM are normal. Pupils are equal, round, and reactive to light.  Neck: Normal range of motion. Neck supple. No JVD present. No thyromegaly present.  Cardiovascular: Normal rate, regular rhythm and intact distal pulses.  Exam reveals no gallop and no friction rub.   Murmur (3/6 systolic) heard. 3/6 bil carotid bruits  Pulmonary/Chest: Effort normal and breath sounds normal. No respiratory distress. He has no wheezes. He has no rales. He exhibits no tenderness.  Abdominal: Soft. Bowel sounds are normal. He exhibits no mass. There is no tenderness.  Umbilical hernia  Genitourinary: Penis normal.  Genitourinary Comments: Refuses digital rectal exam   Musculoskeletal: Normal range of motion. He exhibits no edema.  Lymphadenopathy:    He has no cervical adenopathy.  Neurological: He is alert and oriented to person, place, and time. No cranial nerve deficit.  Skin: Skin is warm and dry.  Psychiatric: He has a normal mood and affect. His behavior is normal. Judgment and thought content normal.   BP 136/82 (BP Location: Left Arm, Cuff Size: Normal)   Pulse 91   Temp 97.5 F (36.4 C) (Oral)   Ht 5' 11"  (1.803 m)   Wt 169 lb (76.7 kg)   BMI 23.57 kg/m        Assessment & Plan:  1. Essential hypertension Do not add salt to diet - CMP14+EGFR  2. Bilateral carotid artery stenosis Continue to watch Keep follow up with cardiology  3. Nonrheumatic aortic valve stenosis Keep follow up with cardiology  4. Hypothyroidism due to acquired atrophy of thyroid - Thyroid Panel With TSH  5. BMI 26.0-26.9,adult Discussed diet and exercise for person with BMI >25 Will recheck weight in 3-6 months  6. Mixed hyperlipidemia Low fat diet - Lipid panel  7. S/P AAA repair Keep follow up with cardiology  8. Vitamin D deficiency    Labs  pending Health maintenance reviewed Diet and exercise encouraged Continue all meds Follow up  In 6 months   [place], FNP

## 2016-04-14 NOTE — Patient Instructions (Signed)
Hypertension Hypertension, commonly called high blood pressure, is when the force of blood pumping through your arteries is too strong. Your arteries are the blood vessels that carry blood from your heart throughout your body. A blood pressure reading consists of a higher number over a lower number, such as 110/72. The higher number (systolic) is the pressure inside your arteries when your heart pumps. The lower number (diastolic) is the pressure inside your arteries when your heart relaxes. Ideally you want your blood pressure below 120/80. Hypertension forces your heart to work harder to pump blood. Your arteries may become narrow or stiff. Having untreated or uncontrolled hypertension can cause heart attack, stroke, kidney disease, and other problems. RISK FACTORS Some risk factors for high blood pressure are controllable. Others are not.  Risk factors you cannot control include:   Race. You may be at higher risk if you are African American.  Age. Risk increases with age.  Gender. Men are at higher risk than women before age 45 years. After age 65, women are at higher risk than men. Risk factors you can control include:  Not getting enough exercise or physical activity.  Being overweight.  Getting too much fat, sugar, calories, or salt in your diet.  Drinking too much alcohol. SIGNS AND SYMPTOMS Hypertension does not usually cause signs or symptoms. Extremely high blood pressure (hypertensive crisis) may cause headache, anxiety, shortness of breath, and nosebleed. DIAGNOSIS To check if you have hypertension, your health care provider will measure your blood pressure while you are seated, with your arm held at the level of your heart. It should be measured at least twice using the same arm. Certain conditions can cause a difference in blood pressure between your right and left arms. A blood pressure reading that is higher than normal on one occasion does not mean that you need treatment. If  it is not clear whether you have high blood pressure, you may be asked to return on a different day to have your blood pressure checked again. Or, you may be asked to monitor your blood pressure at home for 1 or more weeks. TREATMENT Treating high blood pressure includes making lifestyle changes and possibly taking medicine. Living a healthy lifestyle can help lower high blood pressure. You may need to change some of your habits. Lifestyle changes may include:  Following the DASH diet. This diet is high in fruits, vegetables, and whole grains. It is low in salt, red meat, and added sugars.  Keep your sodium intake below 2,300 mg per day.  Getting at least 30-45 minutes of aerobic exercise at least 4 times per week.  Losing weight if necessary.  Not smoking.  Limiting alcoholic beverages.  Learning ways to reduce stress. Your health care provider may prescribe medicine if lifestyle changes are not enough to get your blood pressure under control, and if one of the following is true:  You are 18-59 years of age and your systolic blood pressure is above 140.  You are 60 years of age or older, and your systolic blood pressure is above 150.  Your diastolic blood pressure is above 90.  You have diabetes, and your systolic blood pressure is over 140 or your diastolic blood pressure is over 90.  You have kidney disease and your blood pressure is above 140/90.  You have heart disease and your blood pressure is above 140/90. Your personal target blood pressure may vary depending on your medical conditions, your age, and other factors. HOME CARE INSTRUCTIONS    Have your blood pressure rechecked as directed by your health care provider.   Take medicines only as directed by your health care provider. Follow the directions carefully. Blood pressure medicines must be taken as prescribed. The medicine does not work as well when you skip doses. Skipping doses also puts you at risk for  problems.  Do not smoke.   Monitor your blood pressure at home as directed by your health care provider. SEEK MEDICAL CARE IF:   You think you are having a reaction to medicines taken.  You have recurrent headaches or feel dizzy.  You have swelling in your ankles.  You have trouble with your vision. SEEK IMMEDIATE MEDICAL CARE IF:  You develop a severe headache or confusion.  You have unusual weakness, numbness, or feel faint.  You have severe chest or abdominal pain.  You vomit repeatedly.  You have trouble breathing. MAKE SURE YOU:   Understand these instructions.  Will watch your condition.  Will get help right away if you are not doing well or get worse.   This information is not intended to replace advice given to you by your health care provider. Make sure you discuss any questions you have with your health care provider.   Document Released: 06/09/2005 Document Revised: 10/24/2014 Document Reviewed: 04/01/2013 Elsevier Interactive Patient Education 2016 [employer]  

## 2016-04-15 LAB — CMP14+EGFR
ALT: 26 IU/L (ref 0–44)
AST: 30 IU/L (ref 0–40)
Albumin/Globulin Ratio: 1.7 (ref 1.2–2.2)
Albumin: 4.3 g/dL (ref 3.5–4.8)
Alkaline Phosphatase: 56 IU/L (ref 39–117)
BUN/Creatinine Ratio: 12 (ref 10–24)
BUN: 15 mg/dL (ref 8–27)
Bilirubin Total: 0.5 mg/dL (ref 0.0–1.2)
CALCIUM: 9.6 mg/dL (ref 8.6–10.2)
CO2: 23 mmol/L (ref 18–29)
Chloride: 101 mmol/L (ref 96–106)
Creatinine, Ser: 1.21 mg/dL (ref 0.76–1.27)
GFR, EST AFRICAN AMERICAN: 67 mL/min/{1.73_m2} (ref >59)
GFR, EST NON AFRICAN AMERICAN: 58 mL/min/{1.73_m2} — AB (ref >59)
GLUCOSE: 99 mg/dL (ref 65–99)
Globulin, Total: 2.6 g/dL (ref 1.5–4.5)
Potassium: 5.4 mmol/L — ABNORMAL HIGH (ref 3.5–5.2)
Sodium: 139 mmol/L (ref 134–144)
TOTAL PROTEIN: 6.9 g/dL (ref 6.0–8.5)

## 2016-04-15 LAB — THYROID PANEL WITH TSH
Free Thyroxine Index: 1.2 (ref 1.2–4.9)
T3 Uptake Ratio: 25 % (ref 24–39)
T4, Total: 4.7 ug/dL (ref 4.5–12.0)
TSH: 4.07 u[IU]/mL (ref 0.450–4.500)

## 2016-04-15 LAB — LIPID PANEL
CHOL/HDL RATIO: 3.3 ratio (ref 0.0–5.0)
Cholesterol, Total: 97 mg/dL — ABNORMAL LOW (ref 100–199)
HDL: 29 mg/dL — AB (ref >39)
LDL CALC: 38 mg/dL (ref 0–99)
Triglycerides: 148 mg/dL (ref 0–149)
VLDL CHOLESTEROL CAL: 30 mg/dL (ref 5–40)

## 2016-04-15 NOTE — Addendum Note (Signed)
Addended by: Michaela Corner on: 04/15/2016 04:22 PM   Modules accepted: Orders

## 2016-04-18 ENCOUNTER — Other Ambulatory Visit (INDEPENDENT_AMBULATORY_CARE_PROVIDER_SITE_OTHER): Payer: Medicare Other

## 2016-04-18 DIAGNOSIS — E875 Hyperkalemia: Secondary | ICD-10-CM | POA: Diagnosis not present

## 2016-04-19 LAB — POTASSIUM: Potassium: 5.2 mmol/L (ref 3.5–5.2)

## 2016-05-19 ENCOUNTER — Encounter: Payer: Self-pay | Admitting: Cardiovascular Disease

## 2016-05-19 ENCOUNTER — Ambulatory Visit (INDEPENDENT_AMBULATORY_CARE_PROVIDER_SITE_OTHER): Payer: Medicare Other | Admitting: Cardiovascular Disease

## 2016-05-19 VITALS — BP 124/58 | HR 68 | Ht 71.0 in | Wt 174.6 lb

## 2016-05-19 DIAGNOSIS — E7800 Pure hypercholesterolemia, unspecified: Secondary | ICD-10-CM | POA: Diagnosis not present

## 2016-05-19 DIAGNOSIS — I739 Peripheral vascular disease, unspecified: Secondary | ICD-10-CM | POA: Diagnosis not present

## 2016-05-19 DIAGNOSIS — I10 Essential (primary) hypertension: Secondary | ICD-10-CM | POA: Diagnosis not present

## 2016-05-19 DIAGNOSIS — I350 Nonrheumatic aortic (valve) stenosis: Secondary | ICD-10-CM | POA: Diagnosis not present

## 2016-05-19 NOTE — Patient Instructions (Signed)

## 2016-05-19 NOTE — Progress Notes (Signed)
SUBJECTIVE: The patient presents for follow-up of severe aortic stenosis. He also has a history of hypertension, hyperlipidemia, nonobstructive carotid artery disease, COPD, AAA s/p endovascular repair, and hypothyroidism.  He continues to feel well and remains very active and denies chest pain, shortness of breath, lightheadedness, dizziness, leg swelling, and syncope. He continues to stay active and mows his lawn without difficult.  Primary complaints today related to UTI and prostate issues.  Echocardiogram 09/19/15 showed vigorous left ventricular systolic function, EF 67-34%, grade 1 diastolic dysfunction, severe aortic stenosis, mean gradient 68 mmHg, mild to moderate aortic regurgitation, and mild mitral regurgitation.   Review of Systems: As per "subjective", otherwise negative.  No Known Allergies  Current Outpatient Prescriptions  Medication Sig Dispense Refill  . alfuzosin (UROXATRAL) 10 MG 24 hr tablet     . amLODipine (NORVASC) 5 MG tablet Take 1 tablet (5 mg total) by mouth daily. 30 tablet 5  . aspirin 325 MG tablet Take 325 mg by mouth daily.    Marland Kitchen atorvastatin (LIPITOR) 40 MG tablet Take 1 tablet (40 mg total) by mouth daily. 30 tablet 5  . cholecalciferol (VITAMIN D) 1000 UNITS tablet Take 1 tablet (1,000 Units total) by mouth daily. 30 tablet 6  . levothyroxine (SYNTHROID, LEVOTHROID) 75 MCG tablet Take 1 tablet (75 mcg total) by mouth daily. 30 tablet 8  . lisinopril (PRINIVIL,ZESTRIL) 40 MG tablet Take 1 tablet (40 mg total) by mouth daily. 30 tablet 5  . Multiple Vitamin (ONE-A-DAY MENS PO) Take 1 tablet by mouth daily.      . Omega-3 Fatty Acids (FISH OIL) 1200 MG CAPS Take 2 capsules by mouth daily.      No current facility-administered medications for this visit.     Past Medical History:  Diagnosis Date  . AAA (abdominal aortic aneurysm) ([place])   . AAA (abdominal aortic aneurysm) ([place])   . Arthritis   . Atherosclerotic peripheral vascular disease  ([place])   . Bilateral carotid artery stenosis   . Cancer [facility]) 2006 and Nov. 9, 2013    [facility], right shoulder  . COPD (chronic obstructive pulmonary disease) ([place])   . Hyperlipidemia   . Hypertension   . Joint pain   . Skin cancer   . Thyroid disease    hypothyroid  . Vitamin D deficiency   . Weight gain     Past Surgical History:  Procedure Laterality Date  . ABDOMINAL AORTIC ANEURYSM REPAIR  08-09-09  . BACK SURGERY    . HYDROCELE EXCISION / REPAIR    . SKIN CANCER EXCISION    . SPINE SURGERY      Social History   Social History  . Marital status: Married    Spouse name: N/A  . Number of children: N/A  . Years of education: N/A   Occupational History  . 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 Smoking status: Former Smoker    Packs/day: 1.00    Years: 51.00    Types: Cigarettes    Start date: 11/25/1954    Quit date: 10/27/2005  . Smokeless tobacco: Never Used  . Alcohol use No     Comment: none since 1999  . Drug use: No  . Sexual activity: Yes   Other Topics Concern  . Not on file   Social History Narrative  . No narrative on file     Vitals:   05/19/16 1001  BP: (!) 124/58  Pulse: 68  SpO2: 96%  Weight: 174 lb 9.6 oz (79.2 kg)  Height: '5\' 11"'$  (1.803 m)    PHYSICAL EXAM General: NAD Neck: No JVD, no thyromegaly or thyroid nodule.  Lungs: Diminished but clear with normal respiratory effort. CV: Nondisplaced PMI. Regular rate and rhythm, normal S1/diminished S2, no S3/S4, late-peaking IV/VI ejection systolic murmur. No peripheral edema.  Abdomen: Soft, nontender, no distention.  Skin: Intact without lesions or rashes.  Neurologic: Alert and oriented x 3.  Psych: Normal affect. Extremities: No clubbing or cyanosis.  HEENT: Normal.      ECG: Most recent ECG reviewed.      ASSESSMENT AND PLAN: 1. Severe aortic stenosis: Symptomatically stable. Echo reviewed above. I previously had an extensive  discussion with him about the natural history of aortic stenosis. I informed him of symptoms he would likely experience in the future including chest pain, shortness of breath, lightheadedness, dizziness, leg swelling, fatigue, and possibly syncope. I asked him to call me immediately should he experience any of these symptoms as he will eventually require aortic valve replacement. The patient is understanding and in agreement with this plan. Will likely obtain echocardiogram to assess for interval changes.  2. Essential HTN: Controlled on amlodipine 5 mg and lisinopril 40 mg in context of severe AS. No changes.  3. Hyperlipidemia: On high-intensity statin therapy with Lipitor 40 mg.  4. PVD: Followed by vascular surgery.  Dispo: f/u 3 months.   Kate Sable, M.D., F.A.C.C.

## 2016-05-21 ENCOUNTER — Ambulatory Visit (INDEPENDENT_AMBULATORY_CARE_PROVIDER_SITE_OTHER): Payer: Medicare Other | Admitting: Urology

## 2016-05-21 DIAGNOSIS — R310 Gross hematuria: Secondary | ICD-10-CM

## 2016-05-21 DIAGNOSIS — N401 Enlarged prostate with lower urinary tract symptoms: Secondary | ICD-10-CM

## 2016-05-23 ENCOUNTER — Other Ambulatory Visit: Payer: Self-pay | Admitting: Urology

## 2016-05-23 DIAGNOSIS — R310 Gross hematuria: Secondary | ICD-10-CM

## 2016-05-26 ENCOUNTER — Ambulatory Visit (INDEPENDENT_AMBULATORY_CARE_PROVIDER_SITE_OTHER): Payer: Medicare Other | Admitting: Pharmacist

## 2016-05-26 ENCOUNTER — Encounter: Payer: Self-pay | Admitting: Pharmacist

## 2016-05-26 VITALS — BP 138/70 | HR 68 | Ht 69.5 in | Wt 175.0 lb

## 2016-05-26 DIAGNOSIS — Z0000 Encounter for general adult medical examination without abnormal findings: Secondary | ICD-10-CM

## 2016-05-26 DIAGNOSIS — Z87891 Personal history of nicotine dependence: Secondary | ICD-10-CM

## 2016-05-26 NOTE — Patient Instructions (Addendum)
[name] [name] , Thank you for taking time to come for your Medicare Wellness Visit. I appreciate your ongoing commitment to your health goals. Please review the following plan we discussed and let me know if I can assist you in the future.   These are the goals we discussed:  Increase exercise as able - goal is 150 minutes weekly. Start with 10 or 15 minute and increase as you are able.   We have ordered CT of lungs to be done same day as abdominal CT at [facility]    This is a list of the screening recommended for you and due dates:  Health Maintenance  Topic Date Due  . Tetanus Vaccine  03/23/2017  . Flu Shot  Completed  . Shingles Vaccine  Completed  . Pneumonia vaccines  Completed   Health Maintenance, Male A healthy lifestyle and preventative care can promote health and wellness.  Maintain regular health, dental, and eye exams.  Eat a healthy diet. Foods like vegetables, fruits, whole grains, low-fat dairy products, and lean protein foods contain the nutrients you need and are low in calories. Decrease your intake of foods high in solid fats, added sugars, and salt. Get information about a proper diet from your health care provider, if necessary.  Regular physical exercise is one of the most important things you can do for your health. Most adults should get at least 150 minutes of moderate-intensity exercise (any activity that increases your heart rate and causes you to sweat) each week. In addition, most adults need muscle-strengthening exercises on 2 or more days a week.   Maintain a healthy weight. The body mass index (BMI) is a screening tool to identify possible weight problems. It provides an estimate of body fat based on height and weight. Your health care provider can find your BMI and can help you achieve or maintain a healthy weight. For males 20 years and older:  A BMI below 18.5 is considered underweight.  A BMI of 18.5 to 24.9 is normal.  A BMI of 25 to 29.9 is  considered overweight.  A BMI of 30 and above is considered obese.  Maintain normal blood lipids and cholesterol by exercising and minimizing your intake of saturated fat. Eat a balanced diet with plenty of fruits and vegetables. Blood tests for lipids and cholesterol should begin at age 32 and be repeated every 5 years. If your lipid or cholesterol levels are high, you are over age 67, or you are at high risk for heart disease, you may need your cholesterol levels checked more frequently.Ongoing high lipid and cholesterol levels should be treated with medicines if diet and exercise are not working.  If you smoke, find out from your health care provider how to quit. If you do not use tobacco, do not start.  Lung cancer screening is recommended for adults aged 79-80 years who are at high risk for developing lung cancer because of a history of smoking. A yearly low-dose CT scan of the lungs is recommended for people who have at least a 30-pack-year history of smoking and are current smokers or have quit within the past 15 years. A pack year of smoking is smoking an average of 1 pack of cigarettes a day for 1 year (for example, a 30-pack-year history of smoking could mean smoking 1 pack a day for 30 years or 2 packs a day for 15 years). Yearly screening should continue until the smoker has stopped smoking for at least  15 years. Yearly screening should be stopped for people who develop a health problem that would prevent them from having lung cancer treatment.  If you choose to drink alcohol, do not have more than 2 drinks per day. One drink is considered to be 12 oz (360 mL) of beer, 5 oz (150 mL) of wine, or 1.5 oz (45 mL) of liquor.  Avoid the use of street drugs. Do not share needles with anyone. Ask for help if you need support or instructions about stopping the use of drugs.  High blood pressure causes heart disease and increases the risk of stroke. High blood pressure is more likely to develop  in:  People who have blood pressure in the end of the normal range (100-139/85-89 mm Hg).  People who are overweight or obese.  People who are African American.  If you are 6-3 years of age, have your blood pressure checked every 3-5 years. If you are 38 years of age or older, have your blood pressure checked every year. You should have your blood pressure measured twice-once when you are at a hospital or clinic, and once when you are not at a hospital or clinic. Record the average of the two measurements. To check your blood pressure when you are not at a hospital or clinic, you can use:  An automated blood pressure machine at a pharmacy.  A home blood pressure monitor.  If you are 53-20 years old, ask your health care provider if you should take aspirin to prevent heart disease.  Diabetes screening involves taking a blood sample to check your fasting blood sugar level. This should be done once every 3 years after age 89 if you are at a normal weight and without risk factors for diabetes. Testing should be considered at a younger age or be carried out more frequently if you are overweight and have at least 1 risk factor for diabetes.  Colorectal cancer can be detected and often prevented. Most routine colorectal cancer screening begins at the age of 29 and continues through age 52. However, your health care provider may recommend screening at an earlier age if you have risk factors for colon cancer. On a yearly basis, your health care provider may provide home test kits to check for hidden blood in the stool. A small camera at the end of a tube may be used to directly examine the colon (sigmoidoscopy or colonoscopy) to detect the earliest forms of colorectal cancer. Talk to your health care provider about this at age 8 when routine screening begins. A direct exam of the colon should be repeated every 5-10 years through age 34, unless early forms of precancerous polyps or small growths are  found.  People who are at an increased risk for hepatitis B should be screened for this virus. You are considered at high risk for hepatitis B if:  You were born in a country where hepatitis B occurs often. Talk with your health care provider about which countries are considered high risk.  Your parents were born in a high-risk country and you have not received a shot to protect against hepatitis B (hepatitis B vaccine).  You have HIV or AIDS.  You use needles to inject street drugs.  You live with, or have sex with, someone who has hepatitis B.  You are a man who has sex with other men (MSM).  You get hemodialysis treatment.  You take certain medicines for conditions like cancer, organ transplantation, and autoimmune conditions.  Hepatitis C blood testing is recommended for all people born from [birth year] through 1966 and any individual with known risk factors for hepatitis C.  Healthy men should no longer receive prostate-specific antigen (PSA) blood tests as part of routine cancer screening. Talk to your health care provider about prostate cancer screening.  Testicular cancer screening is not recommended for adolescents or adult males who have no symptoms. Screening includes self-exam, a health care provider exam, and other screening tests. Consult with your health care provider about any symptoms you have or any concerns you have about testicular cancer.  Practice safe sex. Use condoms and avoid high-risk sexual practices to reduce the spread of sexually transmitted infections (STIs).  You should be screened for STIs, including gonorrhea and chlamydia if:  You are sexually active and are younger than 24 years.  You are older than 24 years, and your health care provider tells you that you are at risk for this type of infection.  Your sexual activity has changed since you were last screened, and you are at an increased risk for chlamydia or gonorrhea. Ask your health care provider if  you are at risk.  If you are at risk of being infected with HIV, it is recommended that you take a prescription medicine daily to prevent HIV infection. This is called pre-exposure prophylaxis (PrEP). You are considered at risk if:  You are a man who has sex with other men (MSM).  You are a heterosexual man who is sexually active with multiple partners.  You take drugs by injection.  You are sexually active with a partner who has HIV.  Talk with your health care provider about whether you are at high risk of being infected with HIV. If you choose to begin PrEP, you should first be tested for HIV. You should then be tested every 3 months for as long as you are taking PrEP.  Use sunscreen. Apply sunscreen liberally and repeatedly throughout the day. You should seek shade when your shadow is shorter than you. Protect yourself by wearing long sleeves, pants, a wide-brimmed hat, and sunglasses year round whenever you are outdoors.  Tell your health care provider of new moles or changes in moles, especially if there is a change in shape or color. Also, tell your health care provider if a mole is larger than the size of a pencil eraser.  A one-time screening for abdominal aortic aneurysm (AAA) and surgical repair of large AAAs by ultrasound is recommended for men aged 22-75 years who are current or former smokers.  Stay current with your vaccines (immunizations). This information is not intended to replace advice given to you by your health care provider. Make sure you discuss any questions you have with your health care provider. Document Released: 12/06/2007 Document Revised: 06/30/2014 Document Reviewed: 03/13/2015 Elsevier Interactive Patient Education  2017 [employer].

## 2016-05-26 NOTE — Progress Notes (Signed)
Subjective:   [name] [name] is a 75 y.o. male who presents for a subsequent Medicare Annual Wellness Visit.  [name] [name] lives at home with his wife. 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 He had two children but both are deceased. 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  Over the last year he reports decreased physical activity due to back pain (which is improving). He specifically misses playing golf.    He is currently seeing urologist and being worked up for hematuria and urinary hesitancy / prostate problems.  He is scheduled for abd / pelvic CT at [facility] 06/03/2016.    Current Medications (verified) Outpatient Encounter Prescriptions as of 05/26/2016  Medication Sig  . alfuzosin (UROXATRAL) 10 MG 24 hr tablet Take 10 mg by mouth daily with breakfast.   . amLODipine (NORVASC) 5 MG tablet Take 1 tablet (5 mg total) by mouth daily.  Marland Kitchen aspirin 325 MG tablet Take 325 mg by mouth daily.  Marland Kitchen atorvastatin (LIPITOR) 40 MG tablet Take 1 tablet (40 mg total) by mouth daily.  . cholecalciferol (VITAMIN D) 1000 UNITS tablet Take 1 tablet (1,000 Units total) by mouth daily.  Marland Kitchen levothyroxine (SYNTHROID, LEVOTHROID) 75 MCG tablet Take 1 tablet (75 mcg total) by mouth daily.  Marland Kitchen lisinopril (PRINIVIL,ZESTRIL) 40 MG tablet Take 1 tablet (40 mg total) by mouth daily.  . Multiple Vitamin (ONE-A-DAY MENS PO) Take 1 tablet by mouth daily.    . Omega-3 Fatty Acids (FISH OIL) 1200 MG CAPS Take 2 capsules by mouth daily.    No facility-administered encounter medications on file as of 05/26/2016.     Allergies (verified) Patient has no known allergies.   History: Past Medical History:  Diagnosis Date  . AAA (abdominal aortic aneurysm) ([place])   . Arthritis   . Atherosclerotic peripheral vascular disease ([place])   . Bilateral carotid artery stenosis   . Cancer [facility]) - skin 2006 and Nov. 9,  2013    [facility], right shoulder  . COPD (chronic obstructive pulmonary disease) ([place])   . Hyperlipidemia   . Hypertension   . Joint pain   . Skin cancer   . Thyroid disease    hypothyroid  . Vitamin D deficiency   . Weight gain    Past Surgical History:  Procedure Laterality Date  . ABDOMINAL AORTIC ANEURYSM REPAIR  08-09-09  . BACK SURGERY    . HYDROCELE EXCISION / REPAIR    . SKIN CANCER EXCISION    . SPINE SURGERY     Family History  Problem Relation Age of Onset  . Heart disease Father     Heart Disease before age 58  . Alcohol abuse Father   . Heart attack Father   . Cancer Mother     Lung  . COPD Mother   . COPD Sister   . Lupus Sister   . Hypertension Brother   . Gout Brother   . Heart attack Son   . Gout Brother   . Prostatitis Brother    Social History   Occupational History  . 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 Smoking status: Former Smoker    Packs/day: 1.00    Years: 51.00    Types: Cigarettes    Start date: 11/25/1954    Quit date: 10/27/2005  . Smokeless tobacco: Never Used  .  Alcohol use No     Comment: none since 1999  . Drug use: No  . Sexual activity: Yes    Do you feel safe at home?  Yes Are there smokers in your home (other than you)? No  Dietary issues and exercise activities discussed: Current Exercise Habits: Home exercise routine (decreased playing golf over the last year), Time (Minutes): 10, Frequency (Times/Week): 5, Weekly Exercise (Minutes/Week): 50, Intensity: Mild, Exercise limited by: orthopedic condition(s)  Current Dietary habits:  Avoids salt; tried to eat lots of fruits and vegetables.    Cardiac Risk Factors include: advanced age (>76mn, >>32women);dyslipidemia;family history of premature cardiovascular disease;hypertension;male gender  Objective:    Today's Vitals   05/26/16 1018  BP: 138/70  Pulse: 68  Weight: 175 lb (79.4 kg)  Height: 5' 9.5" (1.765 m)     Max Height as  Adult = 5 10"  PainSc: 2   PainLoc: Back   Body mass index is 25.47 kg/m.   Activities of Daily Living In your present state of health, do you have any difficulty performing the following activities: 05/26/2016  Hearing? N  Vision? N  Difficulty concentrating or making decisions? N  Walking or climbing stairs? N  Dressing or bathing? N  Doing errands, shopping? N  Preparing Food and eating ? N  Using the Toilet? N  In the past six months, have you accidently leaked urine? N  Do you have problems with loss of bowel control? N  Managing your Medications? N  Managing your Finances? N  Housekeeping or managing your Housekeeping? N  Some recent data might be hidden     Depression Screen PHQ 2/9 Scores 05/26/2016 04/14/2016 02/27/2016 02/12/2016  PHQ - 2 Score 0 0 0 0  PHQ- 9 Score - - - -     Fall Risk Fall Risk  05/26/2016 04/14/2016 02/27/2016 02/12/2016 02/01/2016  Falls in the past year? No No No No No    Cognitive Function: MMSE - Mini Mental State Exam 05/26/2016 12/05/2014  Orientation to time 5 5  Orientation to Place 5 5  Registration 3 3  Attention/ Calculation 5 5  Recall 3 2  Language- name 2 objects 2 2  Language- repeat 1 1  Language- follow 3 step command 3 3  Language- read & follow direction 1 1  Write a sentence 1 1  Copy design 1 1  Total score 30 29    Immunizations and Health Maintenance Immunization History  Administered Date(s) Administered  . Influenza Split 03/23/2013  . Influenza, High Dose Seasonal PF 03/06/2014  . Influenza,inj,Quad PF,36+ Mos 03/22/2015  . Influenza-Unspecified 03/22/2016  . Pneumococcal Conjugate-13 04/26/2013  . Pneumococcal Polysaccharide-23 06/23/2004, 12/11/2014  . Td 03/24/2007   There are no preventive care reminders to display for this patient.  Patient Care Team: MChevis Pretty FNP as PCP - General (Nurse Practitioner) [name] MHassell Done FNP as Nurse Practitioner (Nurse Practitioner) JAllyn Kenner MD  (Dermatology) YAdelina MingsHMargret Chance MD as Referring Physician (Optometry) RSuella Broad MD as Consulting Physician (Orthopedic Surgery) VSerafina Mitchell MD as Consulting Physician (Vascular Surgery) SHerminio Commons MD as Attending Physician (Cardiology) PCleon Gustin MD as Consulting Physician (Urology)  Indicate any recent Medical Services you may have received from other than [facility] providers in the past year (date may be approximate).    Assessment:    Annual Wellness Visit  History of smoking with 50 ppy   Screening Tests Health Maintenance  Topic Date Due  . TETANUS/TDAP  03/23/2017  . INFLUENZA VACCINE  Completed  . ZOSTAVAX  Completed  . PNA vac Low Risk Adult  Completed        Plan:   During the course of the visit [place] was educated and counseled about the following appropriate screening and preventive services:   Vaccines to include Pneumoccal, Influenza,  Td, Zostavax - UTD on all vaccines  Colorectal cancer screening - colonoscopy if UTD  Cardiovascular disease screening - EKG and ECHO are both UTD; sees cardiologist and vascular surgeon regularly  BP is at goal   Patient is taking ASA and statin  Diabetes screening - last FBG was WNL at 99  Glaucoma screening / Eye Exam - patient is due eye exam and will make appt.  Nutrition counseling - Increase non-starchy vegetables - carrots, green bean, squash, zucchini, tomatoes, onions, peppers, spinach and other green leafy vegetables, cabbage, lettuce, cucumbers, asparagus, okra (not fried), eggplant.  Limit sugar and processed foods (cakes, cookies, ice cream, crackers and chips).  Increase fresh fruit .  Avoid sugar and calorie containing beverages - soda, sweet tea and juice.  Choose water or unsweetened tea instead.  Prostate cancer screening - current w/u by urologist  Lung cancer screening - ordered lung CT that will hopefully be able to be done same day as abdominal CT  Advanced Directives -  UTD  Physical Activity - encouraged to be as active as able - continue to do leg strengthening exercises from physical therapy     Patient Instructions (the written plan) were given to the patient.   Cherre Robins, PharmD   05/26/2016

## 2016-06-03 ENCOUNTER — Ambulatory Visit (HOSPITAL_COMMUNITY): Payer: Medicare Other

## 2016-06-03 ENCOUNTER — Ambulatory Visit (HOSPITAL_COMMUNITY)
Admission: RE | Admit: 2016-06-03 | Discharge: 2016-06-03 | Disposition: A | Discharge status: Home / Self Care | Payer: Medicare Other | Source: Ambulatory Visit | Attending: Urology | Admitting: Urology

## 2016-06-03 DIAGNOSIS — R319 Hematuria, unspecified: Secondary | ICD-10-CM | POA: Diagnosis not present

## 2016-06-03 DIAGNOSIS — I700 Atherosclerosis of aorta: Secondary | ICD-10-CM | POA: Insufficient documentation

## 2016-06-03 DIAGNOSIS — C679 Malignant neoplasm of bladder, unspecified: Secondary | ICD-10-CM | POA: Insufficient documentation

## 2016-06-03 DIAGNOSIS — R310 Gross hematuria: Secondary | ICD-10-CM | POA: Diagnosis not present

## 2016-06-03 DIAGNOSIS — N329 Bladder disorder, unspecified: Secondary | ICD-10-CM | POA: Diagnosis not present

## 2016-06-03 DIAGNOSIS — I2510 Atherosclerotic heart disease of native coronary artery without angina pectoris: Secondary | ICD-10-CM | POA: Insufficient documentation

## 2016-06-03 LAB — POCT I-STAT CREATININE: Creatinine, Ser: 0.9 mg/dL (ref 0.61–1.24)

## 2016-06-03 MED ORDER — IOPAMIDOL (ISOVUE-300) INJECTION 61%
125.0000 mL | Freq: Once | INTRAVENOUS | Status: AC | PRN
  Administered 2016-06-03: 125 mL via INTRAVENOUS

## 2016-06-05 ENCOUNTER — Other Ambulatory Visit: Payer: Medicare Other

## 2016-06-06 ENCOUNTER — Ambulatory Visit: Admit: 2016-06-06 | Payer: Medicare Other | Admitting: Urology

## 2016-06-06 ENCOUNTER — Inpatient Hospital Stay (HOSPITAL_COMMUNITY)
Admission: EM | Admit: 2016-06-06 | Discharge: 2016-06-07 | DRG: 669 | Disposition: A | Discharge status: Home / Self Care | Payer: Medicare Other | Attending: Urology | Admitting: Urology

## 2016-06-06 ENCOUNTER — Encounter (HOSPITAL_COMMUNITY)
Admission: EM | Disposition: A | Discharge status: Home / Self Care | Payer: Self-pay | Source: Home / Self Care | Attending: Urology

## 2016-06-06 ENCOUNTER — Encounter (HOSPITAL_COMMUNITY): Payer: Self-pay

## 2016-06-06 ENCOUNTER — Observation Stay (HOSPITAL_COMMUNITY): Payer: Medicare Other | Admitting: Anesthesiology

## 2016-06-06 DIAGNOSIS — C675 Malignant neoplasm of bladder neck: Secondary | ICD-10-CM | POA: Diagnosis not present

## 2016-06-06 DIAGNOSIS — Z825 Family history of asthma and other chronic lower respiratory diseases: Secondary | ICD-10-CM | POA: Diagnosis not present

## 2016-06-06 DIAGNOSIS — C674 Malignant neoplasm of posterior wall of bladder: Secondary | ICD-10-CM | POA: Diagnosis present

## 2016-06-06 DIAGNOSIS — N3289 Other specified disorders of bladder: Secondary | ICD-10-CM

## 2016-06-06 DIAGNOSIS — Z8679 Personal history of other diseases of the circulatory system: Secondary | ICD-10-CM | POA: Diagnosis not present

## 2016-06-06 DIAGNOSIS — R011 Cardiac murmur, unspecified: Secondary | ICD-10-CM | POA: Diagnosis not present

## 2016-06-06 DIAGNOSIS — R310 Gross hematuria: Secondary | ICD-10-CM | POA: Diagnosis not present

## 2016-06-06 DIAGNOSIS — I739 Peripheral vascular disease, unspecified: Secondary | ICD-10-CM | POA: Diagnosis present

## 2016-06-06 DIAGNOSIS — J449 Chronic obstructive pulmonary disease, unspecified: Secondary | ICD-10-CM | POA: Diagnosis present

## 2016-06-06 DIAGNOSIS — Z8249 Family history of ischemic heart disease and other diseases of the circulatory system: Secondary | ICD-10-CM

## 2016-06-06 DIAGNOSIS — Z809 Family history of malignant neoplasm, unspecified: Secondary | ICD-10-CM

## 2016-06-06 DIAGNOSIS — Z79899 Other long term (current) drug therapy: Secondary | ICD-10-CM

## 2016-06-06 DIAGNOSIS — N329 Bladder disorder, unspecified: Secondary | ICD-10-CM | POA: Diagnosis not present

## 2016-06-06 DIAGNOSIS — N179 Acute kidney failure, unspecified: Secondary | ICD-10-CM | POA: Diagnosis not present

## 2016-06-06 DIAGNOSIS — Z87891 Personal history of nicotine dependence: Secondary | ICD-10-CM | POA: Diagnosis not present

## 2016-06-06 DIAGNOSIS — E559 Vitamin D deficiency, unspecified: Secondary | ICD-10-CM | POA: Diagnosis not present

## 2016-06-06 DIAGNOSIS — R319 Hematuria, unspecified: Secondary | ICD-10-CM | POA: Diagnosis present

## 2016-06-06 DIAGNOSIS — Z7982 Long term (current) use of aspirin: Secondary | ICD-10-CM | POA: Diagnosis not present

## 2016-06-06 DIAGNOSIS — D494 Neoplasm of unspecified behavior of bladder: Secondary | ICD-10-CM | POA: Diagnosis not present

## 2016-06-06 DIAGNOSIS — M1990 Unspecified osteoarthritis, unspecified site: Secondary | ICD-10-CM | POA: Diagnosis present

## 2016-06-06 DIAGNOSIS — I10 Essential (primary) hypertension: Secondary | ICD-10-CM | POA: Diagnosis present

## 2016-06-06 DIAGNOSIS — E785 Hyperlipidemia, unspecified: Secondary | ICD-10-CM | POA: Diagnosis present

## 2016-06-06 DIAGNOSIS — E039 Hypothyroidism, unspecified: Secondary | ICD-10-CM | POA: Diagnosis not present

## 2016-06-06 DIAGNOSIS — Z85828 Personal history of other malignant neoplasm of skin: Secondary | ICD-10-CM

## 2016-06-06 DIAGNOSIS — R339 Retention of urine, unspecified: Secondary | ICD-10-CM | POA: Diagnosis not present

## 2016-06-06 DIAGNOSIS — R338 Other retention of urine: Secondary | ICD-10-CM

## 2016-06-06 DIAGNOSIS — C679 Malignant neoplasm of bladder, unspecified: Secondary | ICD-10-CM | POA: Diagnosis not present

## 2016-06-06 HISTORY — PX: TRANSURETHRAL RESECTION OF BLADDER TUMOR: SHX2575

## 2016-06-06 LAB — CBC WITH DIFFERENTIAL/PLATELET
BASOS ABS: 0 10*3/uL (ref 0.0–0.1)
Basophils Relative: 0 %
EOS ABS: 0 10*3/uL (ref 0.0–0.7)
EOS PCT: 0 %
HCT: 30.8 % — ABNORMAL LOW (ref 39.0–52.0)
Hemoglobin: 10.5 g/dL — ABNORMAL LOW (ref 13.0–17.0)
Lymphocytes Relative: 10 %
Lymphs Abs: 1.2 10*3/uL (ref 0.7–4.0)
MCH: 32.5 pg (ref 26.0–34.0)
MCHC: 34.1 g/dL (ref 30.0–36.0)
MCV: 95.4 fL (ref 78.0–100.0)
Monocytes Absolute: 0.8 10*3/uL (ref 0.1–1.0)
Monocytes Relative: 6 %
Neutro Abs: 9.8 10*3/uL — ABNORMAL HIGH (ref 1.7–7.7)
Neutrophils Relative %: 84 %
PLATELETS: 160 10*3/uL (ref 150–400)
RBC: 3.23 MIL/uL — AB (ref 4.22–5.81)
RDW: 14 % (ref 11.5–15.5)
WBC: 11.9 10*3/uL — AB (ref 4.0–10.5)

## 2016-06-06 LAB — BASIC METABOLIC PANEL
ANION GAP: 9 (ref 5–15)
BUN: 30 mg/dL — AB (ref 6–20)
CALCIUM: 9.8 mg/dL (ref 8.9–10.3)
CO2: 22 mmol/L (ref 22–32)
Chloride: 104 mmol/L (ref 101–111)
Creatinine, Ser: 1.87 mg/dL — ABNORMAL HIGH (ref 0.61–1.24)
GFR calc Af Amer: 39 mL/min — ABNORMAL LOW (ref >60)
GFR, EST NON AFRICAN AMERICAN: 33 mL/min — AB (ref >60)
Glucose, Bld: 138 mg/dL — ABNORMAL HIGH (ref 65–99)
POTASSIUM: 4 mmol/L (ref 3.5–5.1)
SODIUM: 135 mmol/L (ref 135–145)

## 2016-06-06 LAB — URINALYSIS, ROUTINE W REFLEX MICROSCOPIC

## 2016-06-06 LAB — URINALYSIS, MICROSCOPIC (REFLEX)

## 2016-06-06 LAB — SURGICAL PCR SCREEN
MRSA, PCR: NEGATIVE
Staphylococcus aureus: NEGATIVE

## 2016-06-06 LAB — PROTIME-INR
INR: 1.19
PROTHROMBIN TIME: 15.1 s (ref 11.4–15.2)

## 2016-06-06 SURGERY — TURBT (TRANSURETHRAL RESECTION OF BLADDER TUMOR)
Anesthesia: General

## 2016-06-06 MED ORDER — BELLADONNA ALKALOIDS-OPIUM 16.2-60 MG RE SUPP
RECTAL | Status: AC
  Filled 2016-06-06: qty 1

## 2016-06-06 MED ORDER — CEFAZOLIN SODIUM-DEXTROSE 2-4 GM/100ML-% IV SOLN
2.0000 g | INTRAVENOUS | Status: AC
  Administered 2016-06-06: 2 g via INTRAVENOUS

## 2016-06-06 MED ORDER — PROPOFOL 10 MG/ML IV BOLUS
INTRAVENOUS | Status: AC
  Filled 2016-06-06: qty 20

## 2016-06-06 MED ORDER — CEFAZOLIN SODIUM-DEXTROSE 2-4 GM/100ML-% IV SOLN
INTRAVENOUS | Status: AC
  Filled 2016-06-06: qty 100

## 2016-06-06 MED ORDER — PHENYLEPHRINE HCL 10 MG/ML IJ SOLN
INTRAMUSCULAR | Status: AC
  Filled 2016-06-06: qty 1

## 2016-06-06 MED ORDER — FENTANYL CITRATE (PF) 100 MCG/2ML IJ SOLN: 25.0000 ug | INTRAMUSCULAR | Status: DC | PRN

## 2016-06-06 MED ORDER — ZOLPIDEM TARTRATE 5 MG PO TABS: 5.0000 mg | ORAL_TABLET | Freq: Every evening | ORAL | Status: DC | PRN

## 2016-06-06 MED ORDER — FENTANYL CITRATE (PF) 100 MCG/2ML IJ SOLN
INTRAMUSCULAR | Status: AC
  Filled 2016-06-06: qty 2

## 2016-06-06 MED ORDER — ONDANSETRON HCL 4 MG/2ML IJ SOLN
INTRAMUSCULAR | Status: AC
Start: 2016-06-06 — End: 2016-06-06
  Filled 2016-06-06: qty 2

## 2016-06-06 MED ORDER — PHENYLEPHRINE 40 MCG/ML (10ML) SYRINGE FOR IV PUSH (FOR BLOOD PRESSURE SUPPORT)
PREFILLED_SYRINGE | INTRAVENOUS | Status: AC
  Filled 2016-06-06: qty 10

## 2016-06-06 MED ORDER — CEFAZOLIN IN D5W 1 GM/50ML IV SOLN: 1.0000 g | Freq: Once | INTRAVENOUS | Status: DC

## 2016-06-06 MED ORDER — SODIUM CHLORIDE 0.9 % IV BOLUS (SEPSIS)
1000.0000 mL | Freq: Once | INTRAVENOUS | Status: AC
  Administered 2016-06-06: 1000 mL via INTRAVENOUS

## 2016-06-06 MED ORDER — ETOMIDATE 2 MG/ML IV SOLN
INTRAVENOUS | Status: DC | PRN
  Administered 2016-06-06: 20 mg via INTRAVENOUS

## 2016-06-06 MED ORDER — PHENOL 1.4 % MT LIQD: 1.0000 | OROMUCOSAL | Status: DC | PRN

## 2016-06-06 MED ORDER — HYDROCODONE-ACETAMINOPHEN 5-325 MG PO TABS
1.0000 | ORAL_TABLET | Freq: Once | ORAL | Status: AC
  Administered 2016-06-06: 1 via ORAL
  Filled 2016-06-06: qty 1

## 2016-06-06 MED ORDER — BISACODYL 10 MG RE SUPP: 10.0000 mg | Freq: Every day | RECTAL | Status: DC | PRN

## 2016-06-06 MED ORDER — ONDANSETRON HCL 4 MG/2ML IJ SOLN: 4.0000 mg | INTRAMUSCULAR | Status: DC | PRN

## 2016-06-06 MED ORDER — LIDOCAINE HCL (CARDIAC) 20 MG/ML IV SOLN
INTRAVENOUS | Status: DC | PRN
  Administered 2016-06-06: 100 mg via INTRAVENOUS

## 2016-06-06 MED ORDER — SODIUM CHLORIDE 0.9 % IV SOLN: INTRAVENOUS | Status: DC

## 2016-06-06 MED ORDER — ONDANSETRON HCL 4 MG/2ML IJ SOLN
INTRAMUSCULAR | Status: AC
  Filled 2016-06-06: qty 2

## 2016-06-06 MED ORDER — PHENYLEPHRINE HCL 10 MG/ML IJ SOLN
INTRAVENOUS | Status: DC | PRN
  Administered 2016-06-06: 20 ug/min via INTRAVENOUS

## 2016-06-06 MED ORDER — FENTANYL CITRATE (PF) 100 MCG/2ML IJ SOLN
25.0000 ug | Freq: Once | INTRAMUSCULAR | Status: AC
  Administered 2016-06-06: 25 ug via INTRAVENOUS
  Filled 2016-06-06: qty 2

## 2016-06-06 MED ORDER — AMLODIPINE BESYLATE 5 MG PO TABS
5.0000 mg | ORAL_TABLET | Freq: Every day | ORAL | Status: DC
  Administered 2016-06-07: 5 mg via ORAL
  Filled 2016-06-06: qty 1

## 2016-06-06 MED ORDER — SODIUM CHLORIDE 0.9 % IV SOLN
INTRAVENOUS | Status: DC
  Administered 2016-06-06 (×2): via INTRAVENOUS

## 2016-06-06 MED ORDER — BELLADONNA ALKALOIDS-OPIUM 16.2-60 MG RE SUPP
1.0000 | Freq: Four times a day (QID) | RECTAL | Status: DC | PRN
  Administered 2016-06-06: 1 via RECTAL
  Filled 2016-06-06 (×2): qty 1

## 2016-06-06 MED ORDER — PROPOFOL 10 MG/ML IV BOLUS
INTRAVENOUS | Status: DC | PRN
Start: 2016-06-06 — End: 2016-06-06
  Administered 2016-06-06: 20 mg via INTRAVENOUS

## 2016-06-06 MED ORDER — SODIUM CHLORIDE 0.9 % IR SOLN
Status: DC | PRN
  Administered 2016-06-06: 12000 mL

## 2016-06-06 MED ORDER — FENTANYL CITRATE (PF) 100 MCG/2ML IJ SOLN
INTRAMUSCULAR | Status: DC | PRN
  Administered 2016-06-06: 75 ug via INTRAVENOUS
  Administered 2016-06-06: 25 ug via INTRAVENOUS

## 2016-06-06 MED ORDER — PHENYLEPHRINE HCL 10 MG/ML IJ SOLN
INTRAMUSCULAR | Status: DC | PRN
  Administered 2016-06-06 (×2): 80 ug via INTRAVENOUS
  Administered 2016-06-06 (×2): 40 ug via INTRAVENOUS
  Administered 2016-06-06 (×2): 80 ug via INTRAVENOUS

## 2016-06-06 MED ORDER — MENTHOL 3 MG MT LOZG
1.0000 | LOZENGE | OROMUCOSAL | Status: DC | PRN
  Filled 2016-06-06: qty 9

## 2016-06-06 MED ORDER — ONDANSETRON HCL 4 MG/2ML IJ SOLN: 4.0000 mg | Freq: Once | INTRAMUSCULAR | Status: DC | PRN

## 2016-06-06 MED ORDER — LIDOCAINE 2% (20 MG/ML) 5 ML SYRINGE
INTRAMUSCULAR | Status: AC
  Filled 2016-06-06: qty 5

## 2016-06-06 MED ORDER — ETOMIDATE 2 MG/ML IV SOLN
INTRAVENOUS | Status: AC
  Filled 2016-06-06: qty 10

## 2016-06-06 MED ORDER — LEVOTHYROXINE SODIUM 50 MCG PO TABS
75.0000 ug | ORAL_TABLET | Freq: Every day | ORAL | Status: DC
  Administered 2016-06-07: 75 ug via ORAL
  Filled 2016-06-06: qty 1

## 2016-06-06 MED ORDER — LISINOPRIL 20 MG PO TABS
40.0000 mg | ORAL_TABLET | Freq: Every day | ORAL | Status: DC
  Administered 2016-06-07: 40 mg via ORAL
  Filled 2016-06-06: qty 2

## 2016-06-06 MED ORDER — SODIUM CHLORIDE 0.45 % IV SOLN: INTRAVENOUS | Status: DC

## 2016-06-06 MED ORDER — BELLADONNA ALKALOIDS-OPIUM 16.2-60 MG RE SUPP
RECTAL | Status: DC | PRN
  Administered 2016-06-06: 1 via RECTAL

## 2016-06-06 MED ORDER — CEFAZOLIN SODIUM-DEXTROSE 2-4 GM/100ML-% IV SOLN: 2.0000 g | INTRAVENOUS | Status: DC

## 2016-06-06 MED ORDER — ONDANSETRON HCL 4 MG/2ML IJ SOLN
INTRAMUSCULAR | Status: DC | PRN
Start: 2016-06-06 — End: 2016-06-06
  Administered 2016-06-06: 4 mg via INTRAVENOUS

## 2016-06-06 MED ORDER — ATORVASTATIN CALCIUM 40 MG PO TABS
40.0000 mg | ORAL_TABLET | Freq: Every day | ORAL | Status: DC
  Administered 2016-06-07: 40 mg via ORAL
  Filled 2016-06-06: qty 1

## 2016-06-06 SURGICAL SUPPLY — 20 items
BAG URINE DRAINAGE (UROLOGICAL SUPPLIES) IMPLANT
BAG URO CATCHER STRL LF (MISCELLANEOUS) ×3 IMPLANT
CATH FOLEY 2WAY SLVR 30CC 24FR (CATHETERS) IMPLANT
CATH FOLEY 3WAY 30CC 22FR (CATHETERS) ×2 IMPLANT
ELECT REM PT RETURN 9FT ADLT (ELECTROSURGICAL)
ELECTRODE REM PT RTRN 9FT ADLT (ELECTROSURGICAL) ×1 IMPLANT
EVACUATOR MICROVAS BLADDER (UROLOGICAL SUPPLIES) IMPLANT
GLOVE BIOGEL M 8.0 STRL (GLOVE) ×9 IMPLANT
GOWN STRL REUS W/ TWL XL LVL3 (GOWN DISPOSABLE) ×1 IMPLANT
GOWN STRL REUS W/TWL XL LVL3 (GOWN DISPOSABLE) ×9 IMPLANT
LOOP CUT BIPOLAR 24F LRG (ELECTROSURGICAL) ×2 IMPLANT
MANIFOLD NEPTUNE II (INSTRUMENTS) ×3 IMPLANT
NDL SAFETY ECLIPSE 18X1.5 (NEEDLE) ×1 IMPLANT
NEEDLE HYPO 18GX1.5 SHARP (NEEDLE)
PACK CYSTO (CUSTOM PROCEDURE TRAY) ×3 IMPLANT
SET ASPIRATION TUBING (TUBING) IMPLANT
SYRINGE IRR TOOMEY STRL 70CC (SYRINGE) ×2 IMPLANT
TUBING CONNECTING 10 (TUBING) ×2 IMPLANT
TUBING CONNECTING 10' (TUBING) ×1
WATER STERILE IRR 3000ML UROMA (IV SOLUTION) ×3 IMPLANT

## 2016-06-06 NOTE — Consult Note (Signed)
Subjective: I was asked to see [name] [name] in consultation by Dr. Lacinda [name] for clot retention.  He has recently seen Dr. Alyson [name] and had a CT on 12/12 that showed a 2cm probably bladder tumor at the bladder neck with a possible second tumor at the right base.   After the CT he began to have gross hematuria and present to the [facility] today with clot retention.   He had severe SP pain.  A foley was placed and his pain was relieve but the urine remains very bloody.   He is on ASA '325mg'$ .   He has no associate signs or symptoms.  ROS:  Review of Systems  Constitutional: Negative for chills and fever.  Gastrointestinal: Positive for abdominal pain.  Genitourinary: Positive for hematuria.  All other systems reviewed and are negative.   No Known Allergies  Past Medical History:  Diagnosis Date  . AAA (abdominal aortic aneurysm) ([place])   . AAA (abdominal aortic aneurysm) ([place])   . Arthritis   . Atherosclerotic peripheral vascular disease ([place])   . Bilateral carotid artery stenosis   . Cancer [facility]) 2006 and Nov. 9, 2013    [facility], right shoulder  . COPD (chronic obstructive pulmonary disease) ([place])   . Hyperlipidemia   . Hypertension   . Joint pain   . Skin cancer   . Thyroid disease    hypothyroid  . Vitamin D deficiency   . Weight gain     Past Surgical History:  Procedure Laterality Date  . ABDOMINAL AORTIC ANEURYSM REPAIR  08-09-09  . BACK SURGERY    . HYDROCELE EXCISION / REPAIR    . SKIN CANCER EXCISION    . SPINE SURGERY      Social History   Social History  . Marital status: Married    Spouse name: N/A  . Number of children: N/A  . Years of education: N/A   Occupational History  . 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 Smoking status: Former Smoker    Packs/day: 1.00    Years: 51.00    Types: Cigarettes    Start date: 11/25/1954    Quit date: 10/27/2005  . Smokeless tobacco: Never Used  . Alcohol use No     Comment: none since 1999   . Drug use: No  . Sexual activity: Yes   Other Topics Concern  . Not on file   Social History Narrative  . No narrative on file    Family History  Problem Relation Age of Onset  . Heart disease Father     Heart Disease before age 30  . Alcohol abuse Father   . Heart attack Father   . Cancer Mother     Lung  . COPD Mother   . COPD Sister   . Lupus Sister   . Hypertension Brother   . Gout Brother   . Heart attack Son   . Gout Brother   . Prostatitis Brother     Anti-infectives: Anti-infectives    None      No current facility-administered medications for this encounter.    Current Outpatient Prescriptions  Medication Sig Dispense Refill  . alfuzosin (UROXATRAL) 10 MG 24 hr tablet Take 10 mg by mouth daily with breakfast.     . amLODipine (NORVASC) 5 MG tablet Take 1 tablet (5 mg total) by mouth daily. 30 tablet 5  . aspirin 325 MG tablet Take 325 mg by mouth  daily.    . atorvastatin (LIPITOR) 40 MG tablet Take 1 tablet (40 mg total) by mouth daily. 30 tablet 5  . cholecalciferol (VITAMIN D) 1000 UNITS tablet Take 1 tablet (1,000 Units total) by mouth daily. 30 tablet 6  . levothyroxine (SYNTHROID, LEVOTHROID) 75 MCG tablet Take 1 tablet (75 mcg total) by mouth daily. 30 tablet 8  . lisinopril (PRINIVIL,ZESTRIL) 40 MG tablet Take 1 tablet (40 mg total) by mouth daily. 30 tablet 5  . Multiple Vitamin (ONE-A-DAY MENS PO) Take 1 tablet by mouth daily.      . Omega-3 Fatty Acids (FISH OIL) 1200 MG CAPS Take 2 capsules by mouth daily.      Past medical, surgical, social and family history reviewed.   Objective: Vital signs in last 24 hours: Temp:  [97.7 F (36.5 C)] 97.7 F (36.5 C) (12/15 0229) Pulse Rate:  [82-110] 82 (12/15 0803) Resp:  [16-20] 18 (12/15 0803) BP: (97-144)/(55-78) 101/65 (12/15 0803) SpO2:  [95 %-98 %] 97 % (12/15 0803) Weight:  [79.4 kg (175 lb)] 79.4 kg (175 lb) (12/15 0228)  Intake/Output from previous day: 12/14 0701 - 12/15 0700 In:  130  Out: 890 [Urine:890] Intake/Output this shift: Total I/O In: -  Out: 400 [Urine:400]   Physical Exam  Constitutional: He is oriented to person, place, and time and well-developed, well-nourished, and in no distress.  HENT:  Head: Normocephalic and atraumatic.  Neck: Normal range of motion. Neck supple.  Cardiovascular: Normal rate, regular rhythm and normal heart sounds.   Pulmonary/Chest: Effort normal. No respiratory distress.  Abdominal: Soft. He exhibits no distension and no mass. There is no tenderness.  Genitourinary:  Genitourinary Comments: Foley is draining grossly bloody urine.   Musculoskeletal: Normal range of motion. He exhibits no edema or tenderness.  Lymphadenopathy:    He has no cervical adenopathy.  Neurological: He is alert and oriented to person, place, and time.  Skin: Skin is warm and dry.  Psychiatric: Mood and affect normal.  Vitals reviewed.   Lab Results:   Recent Labs  06/06/16 0250  WBC 11.9*  HGB 10.5*  HCT 30.8*  PLT 160   BMET  Recent Labs  06/03/16 1542 06/06/16 0250  NA  --  135  K  --  4.0  CL  --  104  CO2  --  22  GLUCOSE  --  138*  BUN  --  30*  CREATININE 0.90 1.87*  CALCIUM  --  9.8   PT/INR  Recent Labs  06/06/16 0250  LABPROT 15.1  INR 1.19   ABG No results for input(s): PHART, HCO3 in the last 72 hours.  Invalid input(s): PCO2, PO2  Studies/Results: No results found.  CT films and report reviewed.  Labs reviewed.  Office notes from Dr. Alyson [name] reviewed.  Assessment: 1. Clot retention with bleeding secondary to #2.  2. 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 ARI with Cr of 1.87 up from 1.21 in October.   Possibly secondary to obstruction.   With active bleeding, I am going to get him set up to have a TURBT in [place] by Dr. Diona [name] who is on call today.    CC: Dr. Nat [name]     [name] [name] 06/06/2016 [phone]

## 2016-06-06 NOTE — Transfer of Care (Signed)
Immediate Anesthesia Transfer of Care Note  Patient: [name] [name]  Procedure(s) Performed: Procedure(s): TRANSURETHRAL RESECTION OF BLADDER TUMOR (TURBT) (N/A)  Patient Location: PACU  Anesthesia Type:General  Level of Consciousness: awake, alert  and oriented  Airway & Oxygen Therapy: Patient Spontanous Breathing and Patient connected to face mask oxygen  Post-op Assessment: Report given to RN and Post -op Vital signs reviewed and stable  Post vital signs: Reviewed and stable  Last Vitals:  Vitals:   06/06/16 1130 06/06/16 1303  BP: 105/58 124/69  Pulse: 80 79  Resp:  20  Temp:      Last Pain:  Vitals:   06/06/16 1303  PainSc: 0-No pain         Complications: No apparent anesthesia complications

## 2016-06-06 NOTE — Anesthesia Postprocedure Evaluation (Signed)
Anesthesia Post Note  Patient: [name] [name]  Procedure(s) Performed: Procedure(s) (LRB): TRANSURETHRAL RESECTION OF BLADDER TUMOR (TURBT) (N/A)  Patient location during evaluation: PACU Anesthesia Type: General Level of consciousness: awake and alert Pain management: pain level controlled Vital Signs Assessment: post-procedure vital signs reviewed and stable Respiratory status: spontaneous breathing, nonlabored ventilation, respiratory function stable and patient connected to nasal cannula oxygen Cardiovascular status: blood pressure returned to baseline and stable Postop Assessment: no signs of nausea or vomiting Anesthetic complications: no    Last Vitals:  Vitals:   06/06/16 1726 06/06/16 2023  BP: 139/62 109/65  Pulse: 90 80  Resp: 15 16  Temp: 37.1 C 36.9 C    Last Pain:  Vitals:   06/06/16 2023  TempSrc: Oral  PainSc:                  Catalina Gravel

## 2016-06-06 NOTE — Anesthesia Procedure Notes (Signed)
Procedure Name: LMA Insertion Date/Time: 06/06/2016 3:34 PM Performed by: Glory Buff Pre-anesthesia Checklist: Patient identified, Emergency Drugs available, Suction available and Patient being monitored Patient Re-evaluated:Patient Re-evaluated prior to inductionOxygen Delivery Method: Circle system utilized Preoxygenation: Pre-oxygenation with 100% oxygen Intubation Type: IV induction LMA: LMA inserted LMA Size: 4.0 Number of attempts: 1 Placement Confirmation: positive ETCO2 Tube secured with: Tape Dental Injury: Teeth and Oropharynx as per pre-operative assessment

## 2016-06-06 NOTE — Progress Notes (Signed)
I have introduced myself to the patient.  I have reviewed his clinical information.  He has gross hematuria, most likely secondary to bladder tumor.  This is at his bladder neck/on his prostate.  It has calcifications.  Aggregate.  We will proceed with cystoscopy, clot evacuation and TURBT.  I have notified the patient that his hospitalization may take a couple of nights, as he is still on aspirin and bleeding may still be an issue after his resection.  He understands the procedure and desires to proceed.

## 2016-06-06 NOTE — Anesthesia Preprocedure Evaluation (Addendum)
Anesthesia Evaluation  Patient identified by MRN, date of birth, ID band Patient awake    Reviewed: Allergy & Precautions, NPO status , Patient's Chart, lab work & pertinent test results  Airway Mallampati: II  TM Distance: >3 FB Neck ROM: Full    Dental  (+) Dental Advisory Given, Edentulous Lower, Edentulous Upper   Pulmonary COPD, former smoker,    Pulmonary exam normal breath sounds clear to auscultation       Cardiovascular hypertension, Pt. on medications (-) angina+ Peripheral Vascular Disease  (-) CAD, (-) Past MI and (-) CHF + Valvular Problems/Murmurs AS  Rhythm:Regular Rate:Normal + Systolic murmurs 1/65 Echo: Study Conclusions  - Left ventricle: The cavity size was normal. Wall thickness was   normal. Systolic function was vigorous. The estimated ejection   fraction was in the range of 65% to 70%. Doppler parameters are   consistent with abnormal left ventricular relaxation (grade 1   diastolic dysfunction). - Aortic valve: Mildly calcified annulus. Severely thickened   leaflets. There was severe stenosis. There was mild to moderate   regurgitation. Mean gradient (S): 68 mm Hg. Valve area (VTI):   0.66 cm^2. Valve area (Vmax): 0.62 cm^2. Valve area (Vmean): 0.58   cm^2. - Mitral valve: There was mild regurgitation. - Left atrium: The atrium was mildly to moderately dilated. - Atrial septum: No defect or patent foramen ovale was identified. - Technically difficult study   Neuro/Psych negative neurological ROS  negative psych ROS   GI/Hepatic negative GI ROS, Neg liver ROS,   Endo/Other  Hypothyroidism   Renal/GU Renal disease (ARI 2/2 stone obstruction)   Bladder tumor    Musculoskeletal  (+) Arthritis , Osteoarthritis,    Abdominal   Peds  Hematology negative hematology ROS (+)   Anesthesia Other Findings Day of surgery medications reviewed with the patient.  Reproductive/Obstetrics                            Anesthesia Physical Anesthesia Plan  ASA: IV  Anesthesia Plan: General   Post-op Pain Management:    Induction: Intravenous  Airway Management Planned: LMA  Additional Equipment:   Intra-op Plan:   Post-operative Plan: Extubation in OR  Informed Consent: I have reviewed the patients History and Physical, chart, labs and discussed the procedure including the risks, benefits and alternatives for the proposed anesthesia with the patient or authorized representative who has indicated his/her understanding and acceptance.   Dental advisory given  Plan Discussed with: CRNA  Anesthesia Plan Comments: (Risks/benefits of general anesthesia discussed with patient including risk of damage to teeth, lips, gum, and tongue, nausea/vomiting, allergic reactions to medications, and the possibility of heart attack, stroke and death.  All patient questions answered.  Patient wishes to proceed.)        Anesthesia Quick Evaluation

## 2016-06-06 NOTE — ED Provider Notes (Signed)
[facility] DEPT Provider Note   CSN: [id number] Arrival date & time: 06/06/16  0214     History   Chief Complaint Chief Complaint  Patient presents with  . Hematuria  . Urinary Retention    HPI [name] [name] is a 75 y.o. male.  Patient states unable to urinate since last night. He's had worsening hematuria with clots over the past day. States he's had hematuria for several months and has been worked up by urology with a CT scan several days ago but does not know the results. States he did not have any kind of instrumentation or catheterization. Complains of pain in his bladder and not being able to urinate. He is moving his bowels well. He denies any nausea or vomiting or fever. He is not take any blood thinners other than aspirin. Denies any testicular pain. No focal weakness, numbness or tingling.   The history is provided by the patient and the spouse.  Hematuria  Pertinent negatives include no chest pain, no abdominal pain, no headaches and no shortness of breath.    Past Medical History:  Diagnosis Date  . AAA (abdominal aortic aneurysm) ([place])   . AAA (abdominal aortic aneurysm) ([place])   . Arthritis   . Atherosclerotic peripheral vascular disease ([place])   . Bilateral carotid artery stenosis   . Cancer [facility]) 2006 and Nov. 9, 2013    [facility], right shoulder  . COPD (chronic obstructive pulmonary disease) ([place])   . Hyperlipidemia   . Hypertension   . Joint pain   . Skin cancer   . Thyroid disease    hypothyroid  . Vitamin D deficiency   . Weight gain     Patient Active Problem List   Diagnosis Date Noted  . Prostatitis 03/04/2016  . Aortic stenosis 12/11/2014  . Vitamin D deficiency   . BMI 26.0-26.9,adult 05/18/2013  . Fatty liver 02/24/2013  . Gallbladder polyp 02/24/2013  . HLD (hyperlipidemia) 01/14/2013  . HTN (hypertension) 01/14/2013  . Hypothyroidism 01/14/2013  . S/P AAA repair 05/31/2012  . Carotid stenosis 05/31/2012    Past Surgical  History:  Procedure Laterality Date  . ABDOMINAL AORTIC ANEURYSM REPAIR  08-09-09  . BACK SURGERY    . HYDROCELE EXCISION / REPAIR    . SKIN CANCER EXCISION    . SPINE SURGERY         Home Medications    Prior to Admission medications   Medication Sig Start Date End Date Taking? Authorizing Provider  alfuzosin (UROXATRAL) 10 MG 24 hr tablet Take 10 mg by mouth daily with breakfast.  04/09/16   Historical Provider, MD  amLODipine (NORVASC) 5 MG tablet Take 1 tablet (5 mg total) by mouth daily. 01/02/16   [name] Hassell Done, FNP  aspirin 325 MG tablet Take 325 mg by mouth daily.    Historical Provider, MD  atorvastatin (LIPITOR) 40 MG tablet Take 1 tablet (40 mg total) by mouth daily. 01/02/16   [name] Hassell Done, FNP  cholecalciferol (VITAMIN D) 1000 UNITS tablet Take 1 tablet (1,000 Units total) by mouth daily. 12/11/14   [name] Hassell Done, FNP  levothyroxine (SYNTHROID, LEVOTHROID) 75 MCG tablet Take 1 tablet (75 mcg total) by mouth daily. 01/02/16   [name] Hassell Done, FNP  lisinopril (PRINIVIL,ZESTRIL) 40 MG tablet Take 1 tablet (40 mg total) by mouth daily. 01/02/16   [name] Hassell Done, FNP  Multiple Vitamin (ONE-A-DAY MENS PO) Take 1 tablet by mouth daily.      Historical Provider, MD  Omega-3 Fatty Acids ([place])  1200 MG CAPS Take 2 capsules by mouth daily.     Historical Provider, MD    Family History Family History  Problem Relation Age of Onset  . Heart disease Father     Heart Disease before age 5  . Alcohol abuse Father   . Heart attack Father   . Cancer Mother     Lung  . COPD Mother   . COPD Sister   . Lupus Sister   . Hypertension Brother   . Gout Brother   . Heart attack Son   . Gout Brother   . Prostatitis Brother     Social History Social History  Substance Use Topics  . Smoking status: Former Smoker    Packs/day: 1.00    Years: 51.00    Types: Cigarettes    Start date: 11/25/1954    Quit date: 10/27/2005  . Smokeless tobacco: Never Used    . Alcohol use No     Comment: none since 1999     Allergies   Patient has no known allergies.   Review of Systems Review of Systems  Constitutional: Negative for activity change, appetite change and fever.  HENT: Negative for congestion and rhinorrhea.   Respiratory: Negative for cough, chest tightness and shortness of breath.   Cardiovascular: Negative for chest pain.  Gastrointestinal: Negative for abdominal pain, nausea and vomiting.  Genitourinary: Positive for difficulty urinating, dysuria and hematuria. Negative for flank pain, penile swelling, scrotal swelling and testicular pain.  Musculoskeletal: Negative for arthralgias and myalgias.  Neurological: Negative for tremors, weakness and headaches.  A complete 10 system review of systems was obtained and all systems are negative except as noted in the HPI and PMH.     Physical Exam Updated Vital Signs BP 144/78   Pulse 110   Temp 97.7 F (36.5 C)   Resp 20   Ht 5' 9.5" (1.765 m)   Wt 175 lb (79.4 kg)   SpO2 98%   BMI 25.47 kg/m   Physical Exam  Constitutional: He is oriented to person, place, and time. He appears well-developed and well-nourished. No distress.  HENT:  Head: Normocephalic and atraumatic.  Mouth/Throat: Oropharynx is clear and moist. No oropharyngeal exudate.  Eyes: Conjunctivae and EOM are normal. Pupils are equal, round, and reactive to light.  Neck: Normal range of motion. Neck supple.  No meningismus.  Cardiovascular: Normal rate, regular rhythm, normal heart sounds and intact distal pulses.   No murmur heard. Pulmonary/Chest: Effort normal and breath sounds normal. No respiratory distress.  Abdominal: Soft. There is tenderness. There is no rebound and no guarding.  Suprapubic tenderness. Palpable bladder  Genitourinary:  Genitourinary Comments: Clots and gross blood at meatus No testicular tenderness  Musculoskeletal: Normal range of motion. He exhibits no edema or tenderness.   Neurological: He is alert and oriented to person, place, and time. No cranial nerve deficit. He exhibits normal muscle tone. Coordination normal.  No ataxia on finger to nose bilaterally. No pronator drift. 5/5 strength throughout. CN 2-12 intact.Equal grip strength. Sensation intact.   Skin: Skin is warm.  Psychiatric: He has a normal mood and affect. His behavior is normal.  Nursing note and vitals reviewed.    ED Treatments / Results  Labs (all labs ordered are listed, but only abnormal results are displayed) Labs Reviewed  URINALYSIS, ROUTINE W REFLEX MICROSCOPIC - Abnormal; Notable for the following:       Result Value   Color, Urine RED (*)    APPearance  TURBID (*)    Glucose, UA   (*)    Value: TEST NOT REPORTED DUE TO COLOR INTERFERENCE OF URINE PIGMENT   Hgb urine dipstick   (*)    Value: TEST NOT REPORTED DUE TO COLOR INTERFERENCE OF URINE PIGMENT   Bilirubin Urine   (*)    Value: TEST NOT REPORTED DUE TO COLOR INTERFERENCE OF URINE PIGMENT   Ketones, ur   (*)    Value: TEST NOT REPORTED DUE TO COLOR INTERFERENCE OF URINE PIGMENT   Protein, ur   (*)    Value: TEST NOT REPORTED DUE TO COLOR INTERFERENCE OF URINE PIGMENT   Nitrite   (*)    Value: TEST NOT REPORTED DUE TO COLOR INTERFERENCE OF URINE PIGMENT   Leukocytes, UA   (*)    Value: TEST NOT REPORTED DUE TO COLOR INTERFERENCE OF URINE PIGMENT   All other components within normal limits  CBC WITH DIFFERENTIAL/PLATELET - Abnormal; Notable for the following:    WBC 11.9 (*)    RBC 3.23 (*)    Hemoglobin 10.5 (*)    HCT 30.8 (*)    Neutro Abs 9.8 (*)    All other components within normal limits  BASIC METABOLIC PANEL - Abnormal; Notable for the following:    Glucose, Bld 138 (*)    BUN 30 (*)    Creatinine, Ser 1.87 (*)    GFR calc non Af Amer 33 (*)    GFR calc Af Amer 39 (*)    All other components within normal limits  URINALYSIS, MICROSCOPIC (REFLEX) - Abnormal; Notable for the following:    Bacteria, UA  MANY (*)    Squamous Epithelial / LPF 6-30 (*)    All other components within normal limits  URINE CULTURE  PROTIME-INR    EKG  EKG Interpretation None       Radiology No results found.  Procedures Procedures (including critical care time)  Medications Ordered in ED Medications  HYDROcodone-acetaminophen (NORCO/VICODIN) 5-325 MG per tablet 1 tablet (not administered)     Initial Impression / Assessment and Plan / ED Course  I have reviewed the triage vital signs and the nursing notes.  Pertinent labs & imaging results that were available during my care of the patient were reviewed by me and considered in my medical decision making (see chart for details).  Clinical Course   Patient with suprapubic pain, hematuria with clots. Bladder scan was 600 mL of urine. 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  Patient with urinary retention and hematuria with clots. Discussed with Dr. Junious Silk of urology who agrees with catheter placement despite bleeding bladder mass.  Hemoglobin 10.5 from 14.  Cr 1.8 from 0.9.  Foley catheter placed with 500 mL of bloody urine. Positioning of catheter difficult as foley will not drain if right against wall of bladder, likely due to bladder mass. Pain resolved with catheter placed.  D/w Dr. Junious Silk.  He feels patient may benefit from observation admission with downtrending hemoglobin and rising creatinine and gross hematuria.  Dr. Jeffie Pollock will evaluate patient in the ED this morning.  BP and HR remain stable. Patient feels well with catheter in place. No dizziness. No CP or SOB.  Dr. Lacinda Axon to assume care at shift change and followup urology recommendations.  BP 110/62   Pulse 87   Temp 97.7 F (36.5 C)   Resp 18   Ht 5' 9.5" (1.765 m)   Wt 175  lb (79.4 kg)   SpO2 98%   BMI 25.47 kg/m    Final Clinical Impressions(s) / ED Diagnoses   Final diagnoses:  Acute urinary retention    Hematuria, gross  Bladder mass  Acute renal failure, unspecified acute renal failure type [facility])    New Prescriptions New Prescriptions   No medications on file     Ezequiel Essex, MD 06/06/16 [phone]

## 2016-06-06 NOTE — ED Notes (Signed)
Selita from Dr Teresa Pelton office called. Pt to be admitted to [facility] under Dr Mar Daring for observation. Procedure will be today 1630-1700. Dr Lacinda Axon notified

## 2016-06-06 NOTE — Op Note (Signed)
Preoperative Diagnosis: Gross hematuria with clot obstruction, bladder tumor--largest tumor 2 centimeters Postoperative Diagnosis:  Same  Procedure(s) Performed:   1. Cystourethroscopy 2. Clot evacuation 3. Transurethral resection of bladder tumors, medium (largest bladder tumor 2 centimeters) 4. Foley catheter placement, simple  Surgeons:   Primary: Franchot Gallo, MD  Resident: Lorayne Bender, MD  Anesthesia:  General Anesthesiologist: Catalina Gravel, MD CRNA: Glory Buff, CRNA   Fluids:  See anesthesia record  Estimated blood loss:  200 mL (clotted blood)  Specimens:  Bladder tumor  Cultures:  None  Drains:  83Fr 3-way foley, CBI running  Implant(s): None  Complications:  None  Indications: 75 y.o. year-old male with history of bladder tumor and gross hematuria with clot obstruction. He was previously seen in pre-op holding where he provided written informed consent for the procedure after discussion of the risks, benefits, and alternatives. All questions answered. He is eager to proceed.  Findings:  BPH. Bladder tumors noted at right bladder neck, posterior wall, and left bladder wall. Large well formed clot. Successful clot evacuation and transurethral resection of tumors. Ureteral orifices identified and uninjured.  Description:  The patient was correctly identified in the preop holding area where written informed consent as well potential risk and complication reviewed. He agreed. They were brought back to the operative suite where a preinduction timeout was performed. Once correct information was verified, general anesthesia was induced. They were then gently placed into dorsal lithotomy position with SCDs in place for VTE prophylaxis. They were prepped and draped in the usual sterile fashion and given appropriate preoperative antibiotics with Ancef. A second timeout was then performed.   We inserted a 83F rigid cystoscope per urethra with copious  lubrication and normal saline irrigation running. This demonstrated findings as described above. We used a Toomey syringe to perform clot evacuation, which was extensive. We then re-examined the bladder with the findings described above. We then switched to a 26Fr resectoscope with bipolar loop. We performed transurethral resection of the bladder tumor at the right bladder neck first, which was the largest of the tumors. We carefully resected to an approximate depth of muscularis without any evidence of perforation. Hemostasis was obtained along the way. We then performed resection and coag of the smaller bladder tumors described above. Bladder tumor pieces were sent to pathology. We lastly performed Toomey syringe evacuation of any remaining clot and tumor. Upon final cystoscopy, ureteral orifices were visualized and uninjured. There were no sizable tumors remaining. There was mild general mucosal bleeding but no focal hemorrhage. We then removed the scope with bladder full. We placed a 83Fr 3-way foley catheter in standard fashion with 30cc in the balloon. We started continuous bladder irrigation. A B&O suppository was placed per rectum. The patient as awoken from anesthesia and taken to PACU in stable condition.   Attestation:  Dr. Diona Fanti was present and scrubbed for the entirety of the procedure.    Joesph Fillers. Jonny Ruiz, MD PGY4 Urologic Surgery I consulted on this patient, examined him, and agree with the above operative note.  I was present during the whole procedure and participated in it.

## 2016-06-06 NOTE — ED Notes (Addendum)
Urologist at bedside.

## 2016-06-06 NOTE — ED Triage Notes (Signed)
I have not been able to urinate, but I have been passing blood since I had testing done on Tuesday.  I have been passing blood clots also per pt.  I had a CT scan done on Tuesday afternoon.

## 2016-06-06 NOTE — H&P (Signed)
Urology History and Physical Exam  CC: Gross hematuria, bladder tumor  HPI: 75 year old male transferred to [facility] for urgent management of a newly diagnosed bladder tumor as well as clot retention.  The patient has had gross hematuria for over 3 months.  He presented to the emergency room in [place] earlier today with gross hematuria and clot retention.  CT and revealed a fairly large 2-3 centimeter bladder tumor as well as clot retention.  The patient was seen in the emergency room at [facility] by Dr. Irine Seal of our practice, transferred here for further evaluation and management.  PMH: Past Medical History:  Diagnosis Date  . AAA (abdominal aortic aneurysm) ([place])   . AAA (abdominal aortic aneurysm) ([place])   . Arthritis   . Atherosclerotic peripheral vascular disease ([place])   . Bilateral carotid artery stenosis   . Cancer [facility]) 2006 and Nov. 9, 2013    [facility], right shoulder  . COPD (chronic obstructive pulmonary disease) ([place])   . Hyperlipidemia   . Hypertension   . Joint pain   . Skin cancer   . Thyroid disease    hypothyroid  . Vitamin D deficiency   . Weight gain     PSH: Past Surgical History:  Procedure Laterality Date  . ABDOMINAL AORTIC ANEURYSM REPAIR  08-09-09  . BACK SURGERY    . HYDROCELE EXCISION / REPAIR    . SKIN CANCER EXCISION    . SPINE SURGERY      Allergies: No Known Allergies  Medications: Prescriptions Prior to Admission  Medication Sig Dispense Refill Last Dose  . alfuzosin (UROXATRAL) 10 MG 24 hr tablet Take 10 mg by mouth at bedtime as needed and may repeat dose one time if needed.   06/05/2016 at Unknown time  . amLODipine (NORVASC) 5 MG tablet Take 1 tablet (5 mg total) by mouth daily. 30 tablet 5 06/05/2016 at Unknown time  . aspirin 325 MG tablet Take 325 mg by mouth daily.   06/05/2016 at Unknown time  . atorvastatin (LIPITOR) 40 MG tablet Take 1 tablet (40 mg total) by mouth daily. 30 tablet 5 06/05/2016 at Unknown time   . cholecalciferol (VITAMIN D) 1000 UNITS tablet Take 1 tablet (1,000 Units total) by mouth daily. 30 tablet 6 06/05/2016 at Unknown time  . levothyroxine (SYNTHROID, LEVOTHROID) 75 MCG tablet Take 1 tablet (75 mcg total) by mouth daily. 30 tablet 8 06/05/2016 at Unknown time  . lisinopril (PRINIVIL,ZESTRIL) 40 MG tablet Take 1 tablet (40 mg total) by mouth daily. 30 tablet 5 06/05/2016 at Unknown time  . Multiple Vitamin (ONE-A-DAY MENS PO) Take 1 tablet by mouth daily.     06/05/2016 at Unknown time  . Omega-3 Fatty Acids (FISH OIL) 1200 MG CAPS Take 2 capsules by mouth daily.    06/05/2016 at Unknown time     Social History: Social History   Social History  . Marital status: Married    Spouse name: N/A  . Number of children: N/A  . Years of education: N/A   Occupational History  . 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 Smoking status: Former Smoker    Packs/day: 1.00    Years: 51.00    Types: Cigarettes    Start date: 11/25/1954    Quit date: 10/27/2005  . Smokeless tobacco: Never Used  . Alcohol use No     Comment: none since 1999  . Drug use:  No  . Sexual activity: Yes   Other Topics Concern  . Not on file   Social History Narrative  . No narrative on file    Family History: Family History  Problem Relation Age of Onset  . Heart disease Father     Heart Disease before age 76  . Alcohol abuse Father   . Heart attack Father   . Cancer Mother     Lung  . COPD Mother   . COPD Sister   . Lupus Sister   . Hypertension Brother   . Gout Brother   . Heart attack Son   . Gout Brother   . Prostatitis Brother     Review of Systems: Positive: Gross hematuria, slow stream, pubic discomfort Negative:   A further 10 point review of systems was negative except what is listed in the HPI.                  Physical Exam: '@VITALS2'$ @ General: No acute distress.  Awake. Head:  Normocephalic.  Atraumatic. ENT:  EOMI.  Mucous membranes  moist Neck:  Supple.  No lymphadenopathy. CV:  S1 present. S2 present. Regular rate. Pulmonary: Equal effort bilaterally.  Clear to auscultation bilaterally. Abdomen: Soft.  Non- tender to palpation.  Obese Skin:  Normal turgor.  No visible rash. Extremity: No gross deformity of bilateral upper extremities.  No gross deformity of                             lower extremities. Neurologic: Alert. Appropriate mood.    Studies:  Recent Labs     06/06/16  0250  HGB  10.5*  WBC  11.9*  PLT  160    Recent Labs     06/06/16  0250  NA  135  K  4.0  CL  104  CO2  22  BUN  30*  CREATININE  1.87*  CALCIUM  9.8  GFRNONAA  33*  GFRAA  39*     Recent Labs     06/06/16  0250  INR  1.19     Invalid input(s): ABG    Assessment:  Gross hematuria secondary to moderate size bladder tumor-2 centimeters  Plan: Cystoscopy, clot evacuation, TURBT.

## 2016-06-06 NOTE — Progress Notes (Signed)
Received pt from [facility] ED-- via [place], pt stable, except bloody urine noted. Denies pain, prepared for surgery. Wife and family at bedside. SRP, RN

## 2016-06-07 DIAGNOSIS — Z87891 Personal history of nicotine dependence: Secondary | ICD-10-CM | POA: Diagnosis not present

## 2016-06-07 DIAGNOSIS — E559 Vitamin D deficiency, unspecified: Secondary | ICD-10-CM | POA: Diagnosis not present

## 2016-06-07 DIAGNOSIS — D494 Neoplasm of unspecified behavior of bladder: Secondary | ICD-10-CM | POA: Diagnosis not present

## 2016-06-07 DIAGNOSIS — Z8249 Family history of ischemic heart disease and other diseases of the circulatory system: Secondary | ICD-10-CM | POA: Diagnosis not present

## 2016-06-07 DIAGNOSIS — E785 Hyperlipidemia, unspecified: Secondary | ICD-10-CM | POA: Diagnosis not present

## 2016-06-07 DIAGNOSIS — N3289 Other specified disorders of bladder: Secondary | ICD-10-CM | POA: Diagnosis not present

## 2016-06-07 DIAGNOSIS — R011 Cardiac murmur, unspecified: Secondary | ICD-10-CM | POA: Diagnosis not present

## 2016-06-07 DIAGNOSIS — Z825 Family history of asthma and other chronic lower respiratory diseases: Secondary | ICD-10-CM | POA: Diagnosis not present

## 2016-06-07 DIAGNOSIS — Z7982 Long term (current) use of aspirin: Secondary | ICD-10-CM | POA: Diagnosis not present

## 2016-06-07 DIAGNOSIS — Z809 Family history of malignant neoplasm, unspecified: Secondary | ICD-10-CM | POA: Diagnosis not present

## 2016-06-07 DIAGNOSIS — M1990 Unspecified osteoarthritis, unspecified site: Secondary | ICD-10-CM | POA: Diagnosis not present

## 2016-06-07 DIAGNOSIS — C674 Malignant neoplasm of posterior wall of bladder: Secondary | ICD-10-CM | POA: Diagnosis not present

## 2016-06-07 DIAGNOSIS — I10 Essential (primary) hypertension: Secondary | ICD-10-CM | POA: Diagnosis not present

## 2016-06-07 DIAGNOSIS — C675 Malignant neoplasm of bladder neck: Secondary | ICD-10-CM | POA: Diagnosis not present

## 2016-06-07 DIAGNOSIS — N179 Acute kidney failure, unspecified: Secondary | ICD-10-CM | POA: Diagnosis not present

## 2016-06-07 DIAGNOSIS — R310 Gross hematuria: Secondary | ICD-10-CM | POA: Diagnosis not present

## 2016-06-07 DIAGNOSIS — J449 Chronic obstructive pulmonary disease, unspecified: Secondary | ICD-10-CM | POA: Diagnosis not present

## 2016-06-07 DIAGNOSIS — I739 Peripheral vascular disease, unspecified: Secondary | ICD-10-CM | POA: Diagnosis not present

## 2016-06-07 DIAGNOSIS — Z85828 Personal history of other malignant neoplasm of skin: Secondary | ICD-10-CM | POA: Diagnosis not present

## 2016-06-07 DIAGNOSIS — E039 Hypothyroidism, unspecified: Secondary | ICD-10-CM | POA: Diagnosis not present

## 2016-06-07 LAB — URINE CULTURE: CULTURE: NO GROWTH

## 2016-06-07 LAB — BASIC METABOLIC PANEL
Anion gap: 7 (ref 5–15)
BUN: 19 mg/dL (ref 6–20)
CHLORIDE: 107 mmol/L (ref 101–111)
CO2: 25 mmol/L (ref 22–32)
CREATININE: 0.98 mg/dL (ref 0.61–1.24)
Calcium: 8.6 mg/dL — ABNORMAL LOW (ref 8.9–10.3)
GFR calc non Af Amer: >60 mL/min (ref >60)
GLUCOSE: 102 mg/dL — AB (ref 65–99)
Potassium: 4.1 mmol/L (ref 3.5–5.1)
Sodium: 139 mmol/L (ref 135–145)

## 2016-06-07 LAB — HEMOGLOBIN AND HEMATOCRIT, BLOOD
HCT: 24.1 % — ABNORMAL LOW (ref 39.0–52.0)
Hemoglobin: 8.3 g/dL — ABNORMAL LOW (ref 13.0–17.0)

## 2016-06-07 MED ORDER — ACETAMINOPHEN 325 MG PO TABS
650.0000 mg | ORAL_TABLET | Freq: Four times a day (QID) | ORAL | Status: DC | PRN
  Administered 2016-06-07: 650 mg via ORAL
  Filled 2016-06-07: qty 2

## 2016-06-07 NOTE — Discharge Summary (Signed)
Date of admission: 06/06/2016  Date of discharge: 06/07/2016  Admission diagnosis: Gross hematuria with clot retention, bladder mass  Discharge diagnosis: Same  History and Physical: For full details, please see admission history and physical. Briefly, [name] [name] is a 75 y.o. male with gross hematuria and clot retention with known bladder mass on imaging. After discussing management/treatment options, he  elected to proceed with surgical treatment.  Hospital Course: [name] [name] was taken to the operating room on 06/06/2016 and underwent a clot evacuation with TURBT. He tolerated this procedure well and without complications. Postoperatively, the patient was able to be transferred to a regular hospital room following recovery from anesthesia.  They were able to begin ambulating the night of surgery and remained hemodynamically stable overnight. He was kept on CBI overnight without issue. On POD#1, his CBI was stopped. His urine remained a transparent light red color without clots. They was transitioned to oral pain medication, tolerated a regular diet, and had met all discharge criteria and was able to be discharged home on POD#1. He will hold his Aspirin until his urine clears. He was discharged with foley catheter to drainage.  Laboratory values:  Recent Labs  06/06/16 0250 06/07/16 0522  HGB 10.5* 8.3*  HCT 30.8* 24.1*    Disposition: Home  Discharge instruction: They were provided with foley care teaching and general discharge instructions.   Discharge medications:  Allergies as of 06/07/2016   No Known Allergies     Medication List    STOP taking these medications   aspirin 325 MG tablet     TAKE these medications   alfuzosin 10 MG 24 hr tablet Commonly known as:  UROXATRAL Take 10 mg by mouth at bedtime as needed and may repeat dose one time if needed.   amLODipine 5 MG tablet Commonly known as:  NORVASC Take 1 tablet (5 mg total) by mouth daily.    atorvastatin 40 MG tablet Commonly known as:  LIPITOR Take 1 tablet (40 mg total) by mouth daily.   cholecalciferol 1000 units tablet Commonly known as:  VITAMIN D Take 1 tablet (1,000 Units total) by mouth daily.   Fish Oil 1200 MG Caps Take 2 capsules by mouth daily.   levothyroxine 75 MCG tablet Commonly known as:  SYNTHROID, LEVOTHROID Take 1 tablet (75 mcg total) by mouth daily.   lisinopril 40 MG tablet Commonly known as:  PRINIVIL,ZESTRIL Take 1 tablet (40 mg total) by mouth daily.   ONE-A-DAY MENS PO Take 1 tablet by mouth daily.       Followup: He will follow-up on Tuesday of next week for trial of void and discussion of pathology results.  I met with the patient, and discussed follow-up.  I agree with the above plan.

## 2016-06-07 NOTE — Progress Notes (Signed)
Patient encourage to work but refuses at this time.

## 2016-06-07 NOTE — Progress Notes (Signed)
Report given by RN, agree with nurse assessment of patient and will cont. to monitor.

## 2016-06-07 NOTE — Discharge Instructions (Signed)
Transurethral Resection of the Prostate ° °Care After ° °Refer to this sheet in the next few weeks. These discharge instructions provide you with general information on caring for yourself after you leave the hospital. Your caregiver may also give you specific instructions. Your treatment has been planned according to the most current medical practices available, but unavoidable complications sometimes occur. If you have any problems or questions after discharge, please call your caregiver. ° °HOME CARE INSTRUCTIONS  ° °Medications °· You may receive medicine for pain management. As your level of discomfort decreases, adjustments in your pain medicines may be made.  °· Take all medicines as directed.  °· You may be given a medicine (antibiotic) to kill germs following surgery. Finish all medicines. Let your caregiver know if you have any side effects or problems from the medicine.  °· If you are on aspirin, it would be best not to restart the aspirin until the blood in the urine clears °Hygiene °· You can take a shower after surgery.  °· You should not take a bath while you still have the urethral catheter. °Activity °· You will be encouraged to get out of bed as much as possible and increase your activity level as tolerated.  °· Spend the first week in and around your home. For 3 weeks, avoid the following:  °· Straining.  °· Running.  °· Strenuous work.  °· Walks longer than a few blocks.  °· Riding for extended periods.  °· Sexual relations.  °· Do not lift heavy objects (more than 20 pounds) for at least 1 month. When lifting, use your arms instead of your abdominal muscles.  °· You will be encouraged to walk as tolerated. Do not exert yourself. Increase your activity level slowly. Remember that it is important to keep moving after an operation of any type. This cuts down on the possibility of developing blood clots.  °· Your caregiver will tell you when you can resume driving and light housework. Discuss this  at your first office visit after discharge. °Diet °· No special diet is ordered after a TURP. However, if you are on a special diet for another medical problem, it should be continued.  °· Normal fluid intake is usually recommended.  °· Avoid alcohol and caffeinated drinks for 2 weeks. They irritate the bladder. Decaffeinated drinks are okay.  °· Avoid spicy foods.  °Bladder Function °· For the first 10 days, empty the bladder whenever you feel a definite desire. Do not try to hold the urine for long periods of time.  °· Urinating once or twice a night even after you are healed is not uncommon.  °· You may see some recurrence of blood in the urine after discharge from the hospital. This usually happens within 2 weeks after the procedure.If this occurs, force fluids again as you did in the hospital and reduce your activity.  °Bowel Function °· You may experience some constipation after surgery. This can be minimized by increasing fluids and fiber in your diet. Drink enough water and fluids to keep your urine clear or pale yellow.  °· A stool softener may be prescribed for use at home. Do not strain to move your bowels.  °· If you are requiring increased pain medicine, it is important that you take stool softeners to prevent constipation. This will help to promote proper healing by reducing the need to strain to move your bowels.  °Sexual Activity °· Semen movement in the opposite direction and into the bladder (  retrograde ejaculation) may occur. Since the semen passes into the bladder, cloudy urine can occur the first time you urinate after intercourse. Or, you may not have an ejaculation during erection. Ask your caregiver when you can resume sexual activity. Retrograde ejaculation and reduced semen discharge should not reduce one's pleasure of intercourse.  °Postoperative Visit °· Arrange the date and time of your after surgery visit with your caregiver.  °Return to Work °· After your recovery is complete, you will  be able to return to work and resume all activities. Your caregiver will inform you when you can return to work.  °Foley Catheter Care °A soft, flexible tube (Foley catheter) may have been placed in your bladder to drain urine and fluid. Follow these instructions: °Taking Care of the Catheter °· Keep the area where the catheter leaves your body clean.  °· Attach the catheter to the leg so there is no tension on the catheter.  °· Keep the drainage bag below the level of the bladder, but keep it OFF the floor.  °· Do not take long soaking baths. Your caregiver will give instructions about showering.  °· Wash your hands before touching ANYTHING related to the catheter or bag.  °· Using mild soap and warm water on a washcloth:  °· Clean the area closest to the catheter insertion site using a circular motion around the catheter.  °· Clean the catheter itself by wiping AWAY from the insertion site for several inches down the tube.  °· NEVER wipe upward as this could sweep bacteria up into the urethra (tube in your body that normally drains the bladder) and cause infection.  °· Place a small amount of sterile lubricant at the tip of the penis where the catheter is entering.  °Taking Care of the Drainage Bags °· Two drainage bags may be taken home: a large overnight drainage bag, and a smaller leg bag which fits underneath clothing.  °· It is okay to wear the overnight bag at any time, but NEVER wear the smaller leg bag at night.  °· Keep the drainage bag well below the level of your bladder. This prevents backflow of urine into the bladder and allows the urine to drain freely.  °· Anchor the tubing to your leg to prevent pulling or tension on the catheter. Use tape or a leg strap provided by the hospital.  °· Empty the drainage bag when it is 1/2 to 3/4 full. Wash your hands before and after touching the bag.  °· Periodically check the tubing for kinks to make sure there is no pressure on the tubing which could restrict  the flow of urine.  °Changing the Drainage Bags °· Cleanse both ends of the clean bag with alcohol before changing.  °· Pinch off the rubber catheter to avoid urine spillage during the disconnection.  °· Disconnect the dirty bag and connect the clean one.  °· Empty the dirty bag carefully to avoid a urine spill.  °· Attach the new bag to the leg with tape or a leg strap.  °Cleaning the Drainage Bags °· Whenever a drainage bag is disconnected, it must be cleaned quickly so it is ready for the next use.  °· Wash the bag in warm, soapy water.  °· Rinse the bag thoroughly with warm water.  °· Soak the bag for 30 minutes in a solution of white vinegar and water (1 cup vinegar to 1 quart warm water).  °· Rinse with warm water.  °SEEK MEDICAL   CARE IF:  °· You have chills or night sweats.  °· You are leaking around your catheter or have problems with your catheter. It is not uncommon to have sporadic leakage around your catheter as a result of bladder spasms. If the leakage stops, there is not much need for concern. If you are uncertain, call your caregiver.  °· You develop side effects that you think are coming from your medicines.  °SEEK IMMEDIATE MEDICAL CARE IF:  °· You are suddenly unable to urinate. Check to see if there are any kinks in the drainage tubing that may cause this. If you cannot find any kinks, call your caregiver immediately. This is an emergency.  °· You develop shortness of breath or chest pains.  °· Bleeding persists or clots develop in your urine.  °· You have a fever.  °· You develop pain in your back or over your lower belly (abdomen).  °· You develop pain or swelling in your legs.  °· Any problems you are having get worse rather than better.  °MAKE SURE YOU:  °· Understand these instructions.  °· Will watch your condition.  °· Will get help right away if you are not doing well or get worse.  °Document Released: 06/09/2005 Document Revised: 02/19/2011 Document Reviewed: 01/31/2009 °[employer]®  Patient Information ©2012 [employer].Transurethral Resection of the Prostate °Care After °Refer to this sheet in the next few weeks. These discharge instructions provide you with general information on caring for yourself after you leave the hospital. Your caregiver may also give you specific instructions. Your treatment has been planned according to the most current medical practices available, but unavoidable complications sometimes occur. If you have any problems or questions after discharge, please call your caregiver. °

## 2016-06-09 ENCOUNTER — Encounter (HOSPITAL_COMMUNITY): Payer: Self-pay | Admitting: Urology

## 2016-06-10 ENCOUNTER — Other Ambulatory Visit: Payer: Medicare Other

## 2016-06-10 ENCOUNTER — Ambulatory Visit (INDEPENDENT_AMBULATORY_CARE_PROVIDER_SITE_OTHER): Payer: Medicare Other | Admitting: Urology

## 2016-06-10 DIAGNOSIS — C678 Malignant neoplasm of overlapping sites of bladder: Secondary | ICD-10-CM | POA: Diagnosis not present

## 2016-06-11 ENCOUNTER — Ambulatory Visit (HOSPITAL_COMMUNITY): Payer: Medicare Other

## 2016-06-27 ENCOUNTER — Ambulatory Visit (HOSPITAL_COMMUNITY): Admission: RE | Admit: 2016-06-27 | Payer: Medicare Other | Source: Ambulatory Visit

## 2016-07-02 ENCOUNTER — Other Ambulatory Visit: Payer: Self-pay

## 2016-07-02 ENCOUNTER — Ambulatory Visit (INDEPENDENT_AMBULATORY_CARE_PROVIDER_SITE_OTHER): Payer: Medicare Other

## 2016-07-02 DIAGNOSIS — I350 Nonrheumatic aortic (valve) stenosis: Secondary | ICD-10-CM

## 2016-07-03 ENCOUNTER — Telehealth: Payer: Self-pay | Admitting: *Deleted

## 2016-07-03 NOTE — Telephone Encounter (Signed)
Notes Recorded by Laurine Blazer, LPN on 4/44/6190 at 1:22 AM EST Patient notified and verbalized understanding. Copy to pmd. Follow up scheduled for 08/25/2016 with Dr. Bronson Ing. ------  Notes Recorded by Herminio Commons, MD on 07/02/2016 at 4:08 PM EST Pumping function remains normal. 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

## 2016-07-15 ENCOUNTER — Ambulatory Visit (INDEPENDENT_AMBULATORY_CARE_PROVIDER_SITE_OTHER): Payer: Medicare Other | Admitting: Nurse Practitioner

## 2016-07-15 ENCOUNTER — Encounter: Payer: Self-pay | Admitting: Nurse Practitioner

## 2016-07-15 VITALS — BP 111/56 | HR 73 | Temp 97.5°F | Ht 69.5 in | Wt 172.8 lb

## 2016-07-15 DIAGNOSIS — E782 Mixed hyperlipidemia: Secondary | ICD-10-CM

## 2016-07-15 DIAGNOSIS — Z9889 Other specified postprocedural states: Secondary | ICD-10-CM

## 2016-07-15 DIAGNOSIS — I6523 Occlusion and stenosis of bilateral carotid arteries: Secondary | ICD-10-CM

## 2016-07-15 DIAGNOSIS — I10 Essential (primary) hypertension: Secondary | ICD-10-CM | POA: Diagnosis not present

## 2016-07-15 DIAGNOSIS — E034 Atrophy of thyroid (acquired): Secondary | ICD-10-CM | POA: Diagnosis not present

## 2016-07-15 DIAGNOSIS — Z6826 Body mass index (BMI) 26.0-26.9, adult: Secondary | ICD-10-CM | POA: Diagnosis not present

## 2016-07-15 DIAGNOSIS — Z8679 Personal history of other diseases of the circulatory system: Secondary | ICD-10-CM | POA: Diagnosis not present

## 2016-07-15 DIAGNOSIS — E559 Vitamin D deficiency, unspecified: Secondary | ICD-10-CM | POA: Diagnosis not present

## 2016-07-15 DIAGNOSIS — I350 Nonrheumatic aortic (valve) stenosis: Secondary | ICD-10-CM

## 2016-07-15 MED ORDER — AMLODIPINE BESYLATE 5 MG PO TABS: 5.0000 mg | ORAL_TABLET | Freq: Every day | ORAL | 5 refills | Status: DC

## 2016-07-15 MED ORDER — LISINOPRIL 40 MG PO TABS: 40.0000 mg | ORAL_TABLET | Freq: Every day | ORAL | 5 refills | Status: DC

## 2016-07-15 MED ORDER — LEVOTHYROXINE SODIUM 75 MCG PO TABS: 75.0000 ug | ORAL_TABLET | Freq: Every day | ORAL | 8 refills | Status: DC

## 2016-07-15 MED ORDER — ATORVASTATIN CALCIUM 40 MG PO TABS: 40.0000 mg | ORAL_TABLET | Freq: Every day | ORAL | 5 refills | Status: DC

## 2016-07-15 NOTE — Patient Instructions (Signed)
DASH Eating Plan DASH stands for "Dietary Approaches to Stop Hypertension." The DASH eating plan is a healthy eating plan that has been shown to reduce high blood pressure (hypertension). Additional health benefits may include reducing the risk of type 2 diabetes mellitus, heart disease, and stroke. The DASH eating plan may also help with weight loss. What do I need to know about the DASH eating plan? For the DASH eating plan, you will follow these general guidelines:  Choose foods with less than 150 milligrams of sodium per serving (as listed on the food label).  Use salt-free seasonings or herbs instead of table salt or sea salt.  Check with your health care provider or pharmacist before using salt substitutes.  Eat lower-sodium products. These are often labeled as "low-sodium" or "no salt added."  Eat fresh foods. Avoid eating a lot of canned foods.  Eat more vegetables, fruits, and low-fat dairy products.  Choose whole grains. Look for the word "whole" as the first word in the ingredient list.  Choose fish and skinless chicken or turkey more often than red meat. Limit fish, poultry, and meat to 6 oz (170 g) each day.  Limit sweets, desserts, sugars, and sugary drinks.  Choose heart-healthy fats.  Eat more home-cooked food and less restaurant, buffet, and fast food.  Limit fried foods.  Do not fry foods. Cook foods using methods such as baking, boiling, grilling, and broiling instead.  When eating at a restaurant, ask that your food be prepared with less salt, or no salt if possible. What foods can I eat? Seek help from a dietitian for individual calorie needs. Grains  Whole grain or whole wheat bread. Brown rice. Whole grain or whole wheat pasta. Quinoa, bulgur, and whole grain cereals. Low-sodium cereals. Corn or whole wheat flour tortillas. Whole grain cornbread. Whole grain crackers. Low-sodium crackers. Vegetables  Fresh or frozen vegetables (raw, steamed, roasted, or  grilled). Low-sodium or reduced-sodium tomato and vegetable juices. Low-sodium or reduced-sodium tomato sauce and paste. Low-sodium or reduced-sodium canned vegetables. Fruits  All fresh, canned (in natural juice), or frozen fruits. Meat and Other Protein Products  Ground beef (85% or leaner), grass-fed beef, or beef trimmed of fat. Skinless chicken or turkey. Ground chicken or turkey. Pork trimmed of fat. All fish and seafood. Eggs. Dried beans, peas, or lentils. Unsalted nuts and seeds. Unsalted canned beans. Dairy  Low-fat dairy products, such as skim or 1% milk, 2% or reduced-fat cheeses, low-fat ricotta or cottage cheese, or plain low-fat yogurt. Low-sodium or reduced-sodium cheeses. Fats and Oils  Tub margarines without trans fats. Light or reduced-fat mayonnaise and salad dressings (reduced sodium). Avocado. Safflower, olive, or canola oils. Natural peanut or almond butter. Other  Unsalted popcorn and pretzels. The items listed above may not be a complete list of recommended foods or beverages. Contact your dietitian for more options.  What foods are not recommended? Grains  White bread. White pasta. White rice. Refined cornbread. Bagels and croissants. Crackers that contain trans fat. Vegetables  Creamed or fried vegetables. Vegetables in a cheese sauce. Regular canned vegetables. Regular canned tomato sauce and paste. Regular tomato and vegetable juices. Fruits  Canned fruit in light or heavy syrup. Fruit juice. Meat and Other Protein Products  Fatty cuts of meat. Ribs, chicken wings, bacon, sausage, bologna, salami, chitterlings, fatback, hot dogs, bratwurst, and packaged luncheon meats. Salted nuts and seeds. Canned beans with salt. Dairy  Whole or 2% milk, cream, half-and-half, and cream cheese. Whole-fat or sweetened yogurt. Full-fat cheeses   or blue cheese. Nondairy creamers and whipped toppings. Processed cheese, cheese spreads, or cheese curds. Condiments  Onion and garlic  salt, seasoned salt, table salt, and sea salt. Canned and packaged gravies. Worcestershire sauce. Tartar sauce. Barbecue sauce. Teriyaki sauce. Soy sauce, including reduced sodium. Steak sauce. Fish sauce. Oyster sauce. Cocktail sauce. Horseradish. Ketchup and mustard. Meat flavorings and tenderizers. Bouillon cubes. Hot sauce. Tabasco sauce. Marinades. Taco seasonings. Relishes. Fats and Oils  Butter, stick margarine, lard, shortening, ghee, and bacon fat. Coconut, palm kernel, or palm oils. Regular salad dressings. Other  Pickles and olives. Salted popcorn and pretzels. The items listed above may not be a complete list of foods and beverages to avoid. Contact your dietitian for more information.  Where can I find more information? National Heart, Lung, and Blood Institute: www.nhlbi.nih.gov/health/health-topics/topics/dash/ This information is not intended to replace advice given to you by your health care provider. Make sure you discuss any questions you have with your health care provider. Document Released: 05/29/2011 Document Revised: 11/15/2015 Document Reviewed: 04/13/2013 Elsevier Interactive Patient Education  2017 Elsevier Inc.  

## 2016-07-15 NOTE — Progress Notes (Signed)
Subjective:    Patient ID: [name] [name], male    DOB: 17-Sep-1940, 76 y.o.   MRN: [id number]  Patient here today for follow up of chronic medical problems. Patient says that he is doing well. He has no complaints today . He had to have emergency surgery on 06/06/16 for bladder blockage. 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 He says that he is doing well since surgery- no trouble voiding. He has follow up with urologist the first of February.  Outpatient Encounter Prescriptions as of 04/14/2016  Medication Sig  . alfuzosin (UROXATRAL) 10 MG 24 hr tablet   . amLODipine (NORVASC) 5 MG tablet Take 1 tablet (5 mg total) by mouth daily.  Marland Kitchen aspirin 325 MG tablet Take 325 mg by mouth daily.  Marland Kitchen atorvastatin (LIPITOR) 40 MG tablet Take 1 tablet (40 mg total) by mouth daily.  . cholecalciferol (VITAMIN D) 1000 UNITS tablet Take 1 tablet (1,000 Units total) by mouth daily.  Marland Kitchen levothyroxine (SYNTHROID, LEVOTHROID) 75 MCG tablet Take 1 tablet (75 mcg total) by mouth daily.  . Multiple Vitamin (ONE-A-DAY MENS PO) Take 1 tablet by mouth daily.    . Omega-3 Fatty Acids (FISH OIL) 1200 MG CAPS Take 2 capsules by mouth daily.   Marland Kitchen sulfamethoxazole-trimethoprim (BACTRIM DS,SEPTRA DS) 800-160 MG tablet   . lisinopril (PRINIVIL,ZESTRIL) 40 MG tablet Take 1 tablet (40 mg total) by mouth daily. (Patient not taking: Reported on 04/14/2016)    Hypertension  This is a chronic problem. The current episode started more than 1 year ago. The problem has been waxing and waning since onset. The problem is controlled. Pertinent negatives include no blurred vision, chest pain, headaches, neck pain, palpitations or shortness of breath. Agents associated with hypertension include thyroid hormones. Risk factors for coronary artery disease include dyslipidemia, family history, male gender and sedentary lifestyle. Past treatments include calcium channel blockers and ACE inhibitors. Compliance problems include exercise.   Identifiable causes of hypertension include a thyroid problem.  Hyperlipidemia  This is a chronic problem. The current episode started more than 1 year ago. Recent lipid tests were reviewed and are variable. Exacerbating diseases include hypothyroidism. Pertinent negatives include no chest pain or shortness of breath. Current antihyperlipidemic treatment includes statins. Compliance problems include adherence to exercise.  Risk factors for coronary artery disease include dyslipidemia, hypertension, male sex and a sedentary lifestyle.  Thyroid Problem  Presents for follow-up visit. Patient reports no anxiety, cold intolerance, dry skin or palpitations. The symptoms have been stable. Past treatments include levothyroxine. His past medical history is significant for hyperlipidemia.  vitamin D def  Vitamin d OTC daily -no side effects AAA repair Doing well and sees cardiology every 6 months, was just seen Tuesday of this week.. AOrtic stenosis / carotid stenosis Currently just watcing0- has follow up with cardiologist on October 29,2017.     Review of Systems  Constitutional: Negative.   HENT: Negative.  Negative for sinus pressure.   Eyes: Negative for blurred vision, discharge and redness.  Respiratory: Negative for cough (Productive of moderate amount of white milky substance. ), chest tightness and shortness of breath.   Cardiovascular: Negative for chest pain and palpitations.  Endocrine: Negative for cold intolerance.  Genitourinary:       Denies urinary in continence  Musculoskeletal: Negative for neck pain.  Neurological: Negative for headaches.  Psychiatric/Behavioral: The patient is not nervous/anxious.   All other systems reviewed and are negative.      Objective:   Physical  Exam  Constitutional: He is oriented to person, place, and time. He appears well-developed and well-nourished.  HENT:  Head: Normocephalic.  Right Ear: Hearing, tympanic membrane, external ear and ear  canal normal.  Left Ear: Hearing, tympanic membrane, external ear and ear canal normal.  Nose: Right sinus exhibits no frontal sinus tenderness. Left sinus exhibits no frontal sinus tenderness.  Mouth/Throat: Uvula is midline, oropharynx is clear and moist and mucous membranes are normal.  Eyes: EOM are normal. Pupils are equal, round, and reactive to light.  Neck: Normal range of motion. Neck supple. No JVD present. No thyromegaly present.  Cardiovascular: Normal rate, regular rhythm and intact distal pulses.  Exam reveals no gallop and no friction rub.   Murmur (3/6 systolic) heard. 3/6 bil carotid bruits  Pulmonary/Chest: Effort normal and breath sounds normal. No respiratory distress. He has no wheezes. He has no rales. He exhibits no tenderness.  Abdominal: Soft. Bowel sounds are normal. He exhibits no mass. There is no tenderness.  Umbilical hernia  Genitourinary: Penis normal.  Genitourinary Comments: Refuses digital rectal exam   Musculoskeletal: Normal range of motion. He exhibits no edema.  Lymphadenopathy:    He has no cervical adenopathy.  Neurological: He is alert and oriented to person, place, and time. No cranial nerve deficit.  Skin: Skin is warm and dry.  Psychiatric: He has a normal mood and affect. His behavior is normal. Judgment and thought content normal.   BP (!) 111/56   Pulse 73   Temp 97.5 F (36.4 C) (Oral)   Ht 5' 9.5" (1.765 m)   Wt 172 lb 12.8 oz (78.4 kg)   BMI 25.15 kg/m         Assessment & Plan:  1. Essential hypertension Low sodiumdiet - CMP14+EGFR - amLODipine (NORVASC) 5 MG tablet; Take 1 tablet (5 mg total) by mouth daily.  Dispense: 30 tablet; Refill: 5  2. Bilateral carotid artery stenosis Have follow up with cardiology in 1 month  3. Nonrheumatic aortic valve stenosis  4. Hypothyroidism due to acquired atrophy of thyroid - Thyroid Panel With TSH - levothyroxine (SYNTHROID, LEVOTHROID) 75 MCG tablet; Take 1 tablet (75 mcg total)  by mouth daily.  Dispense: 30 tablet; Refill: 8  5. BMI 26.0-26.9,adult Discussed diet and exercise for person with BMI >25 Will recheck weight in 3-6 months  6. Mixed hyperlipidemia Low fat diet - Lipid panel - lisinopril (PRINIVIL,ZESTRIL) 40 MG tablet; Take 1 tablet (40 mg total) by mouth daily.  Dispense: 30 tablet; Refill: 5 - atorvastatin (LIPITOR) 40 MG tablet; Take 1 tablet (40 mg total) by mouth daily.  Dispense: 30 tablet; Refill: 5  7. S/P AAA repair Keep follow up with cardiovascular surgeon  8. Vitamin D deficiency Daily OTC vitamin d    Labs pending Health maintenance reviewed Diet and exercise encouraged Continue all meds Follow up  In 3 months   [place], FNP

## 2016-07-16 LAB — CMP14+EGFR
ALT: 16 IU/L (ref 0–44)
AST: 26 IU/L (ref 0–40)
Albumin/Globulin Ratio: 1.6 (ref 1.2–2.2)
Albumin: 4.2 g/dL (ref 3.5–4.8)
Alkaline Phosphatase: 64 IU/L (ref 39–117)
BUN/Creatinine Ratio: 12 (ref 10–24)
BUN: 11 mg/dL (ref 8–27)
Bilirubin Total: 0.3 mg/dL (ref 0.0–1.2)
CALCIUM: 9 mg/dL (ref 8.6–10.2)
CO2: 24 mmol/L (ref 18–29)
CREATININE: 0.94 mg/dL (ref 0.76–1.27)
Chloride: 105 mmol/L (ref 96–106)
GFR calc Af Amer: 91 mL/min/{1.73_m2} (ref >59)
GFR, EST NON AFRICAN AMERICAN: 78 mL/min/{1.73_m2} (ref >59)
GLOBULIN, TOTAL: 2.6 g/dL (ref 1.5–4.5)
Glucose: 100 mg/dL — ABNORMAL HIGH (ref 65–99)
Potassium: 4.8 mmol/L (ref 3.5–5.2)
SODIUM: 143 mmol/L (ref 134–144)
Total Protein: 6.8 g/dL (ref 6.0–8.5)

## 2016-07-16 LAB — LIPID PANEL
CHOL/HDL RATIO: 3.2 ratio (ref 0.0–5.0)
Cholesterol, Total: 96 mg/dL — ABNORMAL LOW (ref 100–199)
HDL: 30 mg/dL — AB (ref >39)
LDL CALC: 49 mg/dL (ref 0–99)
TRIGLYCERIDES: 85 mg/dL (ref 0–149)
VLDL Cholesterol Cal: 17 mg/dL (ref 5–40)

## 2016-07-16 LAB — THYROID PANEL WITH TSH
Free Thyroxine Index: 1.5 (ref 1.2–4.9)
T3 Uptake Ratio: 28 % (ref 24–39)
T4, Total: 5.5 ug/dL (ref 4.5–12.0)
TSH: 4.29 u[IU]/mL (ref 0.450–4.500)

## 2016-07-16 NOTE — Patient Instructions (Signed)
[name] [name]  07/16/2016     '@PREFPERIOPPHARMACY'$ @   Your procedure is scheduled on 07/22/2016.  Report to [facility] at 6:15 A.M.  Call this number if you have problems the morning of surgery:  [phone]   Remember:  Do not eat food or drink liquids after midnight.  Take these medicines the morning of surgery with A SIP OF WATER Uroxatrol, Amlodipine, Synthroid, Lisinopril   Do not wear jewelry, make-up or nail polish.  Do not wear lotions, powders, or perfumes, or deoderant.  Do not shave 48 hours prior to surgery.  Men may shave face and neck.  Do not bring valuables to the hospital.  [facility] is not responsible for any belongings or valuables.  Contacts, dentures or bridgework may not be worn into surgery.  Leave your suitcase in the car.  After surgery it may be brought to your room.  For patients admitted to the hospital, discharge time will be determined by your treatment team.  Patients discharged the day of surgery will not be allowed to drive home.   Please read over the following fact sheets that you were given. Surgical Site Infection Prevention and Anesthesia Post-op Instructions     PATIENT INSTRUCTIONS POST-ANESTHESIA  IMMEDIATELY FOLLOWING SURGERY:  Do not drive or operate machinery for the first twenty four hours after surgery.  Do not make any important decisions for twenty four hours after surgery or while taking narcotic pain medications or sedatives.  If you develop intractable nausea and vomiting or a severe headache please notify your doctor immediately.  FOLLOW-UP:  Please make an appointment with your surgeon as instructed. You do not need to follow up with anesthesia unless specifically instructed to do so.  WOUND CARE INSTRUCTIONS (if applicable):  Keep a dry clean dressing on the anesthesia/puncture wound site if there is drainage.  Once the wound has quit draining you may leave it open to air.  Generally you should leave the bandage  intact for twenty four hours unless there is drainage.  If the epidural site drains for more than 36-48 hours please call the anesthesia department.  QUESTIONS?:  Please feel free to call your physician or the hospital operator if you have any questions, and they will be happy to assist you.      Transurethral Resection of Bladder Tumor Transurethral resection of a bladder tumor is the removal (resection) of a cancerous growth (tumor) on the inside wall of the bladder. The bladder is the organ that holds urine. The tumor is removed through the tube that carries urine out of the body (urethra). In a transurethral resection, a thin telescope with a light, a tiny camera, and an electric cutting edge (resectoscope) is passed through the urethra. In men, the opening of the urethra is at the end of the penis. In women, it is just above the vaginal opening. Tell a health care provider about:  Any allergies you have.  All medicines you are taking, including vitamins, herbs, eye drops, creams, and over-the-counter medicines.  Any problems you or family members have had with anesthetic medicines.  Any blood disorders you have.  Any surgeries you have had.  Any medical conditions you have.  Any recent urinary tract infections you have had.  Whether you are pregnant or may be pregnant. What are the risks? Generally, this is a safe procedure. However, problems may occur, including:  Infection.  Bleeding.  Allergic reactions to medicines.  Damage to other structures or organs, such  as:  The urethra.  The tubes that drain urine from the kidneys into the bladder (ureters).  Pain and burning during urination.  Difficulty urinating due to partial blockage of the urethra.  Inability to urinate (urinary retention). What happens before the procedure?  Follow instructions from your health care provider about eating and drinking restrictions.  Ask your health care provider  about:  Changing or stopping your regular medicines. This is especially important if you are taking diabetes medicines or blood thinners.  Taking medicines such as aspirin and ibuprofen. These medicines can thin your blood. Do not take these medicines before your procedure if your health care provider instructs you not to.  You may have a physical exam.  You may have tests, including:  Blood tests.  Urine tests.  Electrocardiogram (ECG). This test measures the electrical activity of the heart.  You may be given antibiotic medicine to help prevent infection.  Ask your health care provider how your surgical site will be marked or identified.  Plan to have someone take you home after the procedure. What happens during the procedure?  To reduce your risk of infection:  Your health care team will wash or sanitize their hands.  Your skin will be washed with soap.  An IV tube will be inserted into one of your veins.  You will be given one or more of the following:  A medicine to help you relax (sedative).  A medicine to make you fall asleep (general anesthetic).  A medicine that is injected into your spine to numb the area below and slightly above the injection site (spinal anesthetic).  Your legs will be placed in foot rests (stirrups) so that your legs are apart and your knees are bent.  The resectoscope will be passed through your urethra and into your bladder.  The part of your bladder that is affected by the tumor will be resected using the cutting edge of the resectoscope.  The resectoscope will be removed.  A thin, flexible tube (catheter) will be passed through your urethra and into your bladder. The catheter will drain urine into a bag outside of your body.  Fluid may be passed through the catheter to keep the catheter open. The procedure may vary among health care providers and hospitals. What happens after the procedure?  Your blood pressure, heart rate,  breathing rate, and blood oxygen level will be monitored often until the medicines you were given have worn off.  You may continue to receive fluids and medicines through an IV tube.  You will have some pain. Pain medicine will be available to help you.  You will have a catheter draining your urine.  You will have blood in your urine. Your catheter may be kept in until your urine is clear.  Your urinary drainage will be monitored. If necessary, your bladder may be rinsed out (irrigated) by passing fluid through your catheter.  You will be encouraged to walk around as soon as possible.  You may have to wear compression stockings. These stockings help to prevent blood clots and reduce swelling in your legs.  Do not drive for 24 hours if you received a sedative. This information is not intended to replace advice given to you by your health care provider. Make sure you discuss any questions you have with your health care provider. Document Released: 04/05/2009 Document Revised: 02/10/2016 Document Reviewed: 03/01/2015 Elsevier Interactive Patient Education  2017 [employer].

## 2016-07-17 ENCOUNTER — Other Ambulatory Visit: Payer: Self-pay

## 2016-07-17 ENCOUNTER — Ambulatory Visit: Payer: Self-pay | Admitting: Urology

## 2016-07-17 ENCOUNTER — Encounter (HOSPITAL_COMMUNITY)
Admission: RE | Admit: 2016-07-17 | Discharge: 2016-07-17 | Disposition: A | Discharge status: Home / Self Care | Payer: Medicare Other | Source: Ambulatory Visit | Attending: Urology | Admitting: Urology

## 2016-07-17 ENCOUNTER — Encounter (HOSPITAL_COMMUNITY): Payer: Self-pay

## 2016-07-17 DIAGNOSIS — Z01812 Encounter for preprocedural laboratory examination: Secondary | ICD-10-CM | POA: Insufficient documentation

## 2016-07-17 LAB — BASIC METABOLIC PANEL
Anion gap: 8 (ref 5–15)
BUN: 18 mg/dL (ref 6–20)
CALCIUM: 9.3 mg/dL (ref 8.9–10.3)
CO2: 25 mmol/L (ref 22–32)
Chloride: 107 mmol/L (ref 101–111)
Creatinine, Ser: 0.91 mg/dL (ref 0.61–1.24)
GFR calc Af Amer: >60 mL/min (ref >60)
Glucose, Bld: 87 mg/dL (ref 65–99)
Potassium: 4.6 mmol/L (ref 3.5–5.1)
SODIUM: 140 mmol/L (ref 135–145)

## 2016-07-17 LAB — CBC
HCT: 31.4 % — ABNORMAL LOW (ref 39.0–52.0)
Hemoglobin: 10.2 g/dL — ABNORMAL LOW (ref 13.0–17.0)
MCH: 29.1 pg (ref 26.0–34.0)
MCHC: 32.5 g/dL (ref 30.0–36.0)
MCV: 89.7 fL (ref 78.0–100.0)
PLATELETS: 195 10*3/uL (ref 150–400)
RBC: 3.5 MIL/uL — AB (ref 4.22–5.81)
RDW: 13.3 % (ref 11.5–15.5)
WBC: 6.5 10*3/uL (ref 4.0–10.5)

## 2016-07-21 ENCOUNTER — Encounter: Payer: Self-pay | Admitting: Family

## 2016-07-22 ENCOUNTER — Encounter (HOSPITAL_COMMUNITY): Payer: Self-pay | Admitting: *Deleted

## 2016-07-22 ENCOUNTER — Encounter (HOSPITAL_COMMUNITY)
Admission: RE | Disposition: A | Discharge status: Home / Self Care | Payer: Self-pay | Source: Ambulatory Visit | Attending: Urology

## 2016-07-22 ENCOUNTER — Ambulatory Visit (HOSPITAL_COMMUNITY): Payer: Medicare Other | Admitting: Anesthesiology

## 2016-07-22 ENCOUNTER — Ambulatory Visit (HOSPITAL_COMMUNITY)
Admission: RE | Admit: 2016-07-22 | Discharge: 2016-07-22 | Disposition: A | Discharge status: Home / Self Care | Payer: Medicare Other | Source: Ambulatory Visit | Attending: Urology | Admitting: Urology

## 2016-07-22 DIAGNOSIS — Z87891 Personal history of nicotine dependence: Secondary | ICD-10-CM | POA: Diagnosis not present

## 2016-07-22 DIAGNOSIS — J449 Chronic obstructive pulmonary disease, unspecified: Secondary | ICD-10-CM | POA: Diagnosis not present

## 2016-07-22 DIAGNOSIS — E559 Vitamin D deficiency, unspecified: Secondary | ICD-10-CM | POA: Insufficient documentation

## 2016-07-22 DIAGNOSIS — E039 Hypothyroidism, unspecified: Secondary | ICD-10-CM | POA: Diagnosis not present

## 2016-07-22 DIAGNOSIS — C674 Malignant neoplasm of posterior wall of bladder: Secondary | ICD-10-CM | POA: Insufficient documentation

## 2016-07-22 DIAGNOSIS — Z79899 Other long term (current) drug therapy: Secondary | ICD-10-CM | POA: Diagnosis not present

## 2016-07-22 DIAGNOSIS — C678 Malignant neoplasm of overlapping sites of bladder: Secondary | ICD-10-CM

## 2016-07-22 DIAGNOSIS — Z85828 Personal history of other malignant neoplasm of skin: Secondary | ICD-10-CM | POA: Diagnosis not present

## 2016-07-22 DIAGNOSIS — E785 Hyperlipidemia, unspecified: Secondary | ICD-10-CM | POA: Insufficient documentation

## 2016-07-22 DIAGNOSIS — Z8551 Personal history of malignant neoplasm of bladder: Secondary | ICD-10-CM | POA: Diagnosis not present

## 2016-07-22 DIAGNOSIS — I10 Essential (primary) hypertension: Secondary | ICD-10-CM | POA: Diagnosis not present

## 2016-07-22 DIAGNOSIS — C679 Malignant neoplasm of bladder, unspecified: Secondary | ICD-10-CM | POA: Diagnosis not present

## 2016-07-22 DIAGNOSIS — D494 Neoplasm of unspecified behavior of bladder: Secondary | ICD-10-CM | POA: Diagnosis not present

## 2016-07-22 HISTORY — PX: TRANSURETHRAL RESECTION OF BLADDER TUMOR: SHX2575

## 2016-07-22 SURGERY — TURBT (TRANSURETHRAL RESECTION OF BLADDER TUMOR)
Anesthesia: General

## 2016-07-22 MED ORDER — ONDANSETRON HCL 4 MG/2ML IJ SOLN
4.0000 mg | Freq: Once | INTRAMUSCULAR | Status: AC
  Administered 2016-07-22: 4 mg via INTRAVENOUS

## 2016-07-22 MED ORDER — STERILE WATER FOR IRRIGATION IR SOLN
Status: DC | PRN
  Administered 2016-07-22: 1000 mL

## 2016-07-22 MED ORDER — LIDOCAINE HCL (PF) 1 % IJ SOLN
INTRAMUSCULAR | Status: AC
  Filled 2016-07-22: qty 5

## 2016-07-22 MED ORDER — FENTANYL CITRATE (PF) 100 MCG/2ML IJ SOLN
INTRAMUSCULAR | Status: AC
  Filled 2016-07-22: qty 2

## 2016-07-22 MED ORDER — MIDAZOLAM HCL 2 MG/2ML IJ SOLN
INTRAMUSCULAR | Status: AC
  Filled 2016-07-22: qty 2

## 2016-07-22 MED ORDER — CEFAZOLIN SODIUM-DEXTROSE 2-4 GM/100ML-% IV SOLN
2.0000 g | INTRAVENOUS | Status: AC
  Administered 2016-07-22: 2 g via INTRAVENOUS
  Filled 2016-07-22: qty 100

## 2016-07-22 MED ORDER — HYDROMORPHONE HCL 1 MG/ML IJ SOLN
0.2500 mg | INTRAMUSCULAR | Status: DC | PRN
  Administered 2016-07-22: 0.5 mg via INTRAVENOUS
  Filled 2016-07-22: qty 0.5

## 2016-07-22 MED ORDER — WATER FOR IRRIGATION, STERILE IR SOLN
Status: DC | PRN
  Administered 2016-07-22 (×2): 3000 mL via SURGICAL_CAVITY

## 2016-07-22 MED ORDER — ONDANSETRON HCL 4 MG/2ML IJ SOLN
INTRAMUSCULAR | Status: AC
  Filled 2016-07-22: qty 2

## 2016-07-22 MED ORDER — PROPOFOL 10 MG/ML IV BOLUS
INTRAVENOUS | Status: AC
  Filled 2016-07-22: qty 20

## 2016-07-22 MED ORDER — LIDOCAINE HCL (CARDIAC) 10 MG/ML IV SOLN
INTRAVENOUS | Status: DC | PRN
  Administered 2016-07-22: 30 mg via INTRAVENOUS

## 2016-07-22 MED ORDER — PROPOFOL 10 MG/ML IV BOLUS
INTRAVENOUS | Status: DC | PRN
  Administered 2016-07-22: 110 mg via INTRAVENOUS
  Administered 2016-07-22: 20 mg via INTRAVENOUS

## 2016-07-22 MED ORDER — CEPHALEXIN 500 MG PO CAPS: 500.0000 mg | ORAL_CAPSULE | Freq: Two times a day (BID) | ORAL | 0 refills | Status: DC

## 2016-07-22 MED ORDER — LACTATED RINGERS IV SOLN
INTRAVENOUS | Status: DC
  Administered 2016-07-22: 07:00:00 via INTRAVENOUS

## 2016-07-22 MED ORDER — EPHEDRINE SULFATE 50 MG/ML IJ SOLN
INTRAMUSCULAR | Status: DC | PRN
  Administered 2016-07-22: 10 mg via INTRAVENOUS
  Administered 2016-07-22: 5 mg via INTRAVENOUS

## 2016-07-22 MED ORDER — MIDAZOLAM HCL 2 MG/2ML IJ SOLN
0.5000 mg | INTRAMUSCULAR | Status: DC | PRN
  Administered 2016-07-22: 2 mg via INTRAVENOUS

## 2016-07-22 MED ORDER — FENTANYL CITRATE (PF) 100 MCG/2ML IJ SOLN
25.0000 ug | INTRAMUSCULAR | Status: DC | PRN
  Administered 2016-07-22: 25 ug via INTRAVENOUS

## 2016-07-22 MED ORDER — FENTANYL CITRATE (PF) 100 MCG/2ML IJ SOLN
INTRAMUSCULAR | Status: DC | PRN
  Administered 2016-07-22: 25 ug via INTRAVENOUS

## 2016-07-22 SURGICAL SUPPLY — 28 items
BAG DRAIN URO TABLE W/ADPT NS (DRAPE) ×3 IMPLANT
BAG DRN 8 ADPR NS SKTRN CSTL (DRAPE) ×1
BAG HAMPER (MISCELLANEOUS) ×3 IMPLANT
BAG URINE DRAINAGE (UROLOGICAL SUPPLIES) ×2 IMPLANT
BAG URINE LEG 500ML (DRAIN) ×3 IMPLANT
CATH FOLEY 2WAY SLVR  5CC 20FR (CATHETERS) ×2
CATH FOLEY 2WAY SLVR 5CC 20FR (CATHETERS) IMPLANT
CLOTH BEACON ORANGE TIMEOUT ST (SAFETY) ×3 IMPLANT
ELECT REM PT RETURN 9FT ADLT (ELECTROSURGICAL) ×3
ELECTRODE REM PT RTRN 9FT ADLT (ELECTROSURGICAL) ×1 IMPLANT
EVACUATOR MICROVAS BLADDER (UROLOGICAL SUPPLIES) IMPLANT
GLOVE BIO SURGEON STRL SZ8 (GLOVE) ×3 IMPLANT
GLOVE ECLIPSE 6.5 STRL STRAW (GLOVE) ×2 IMPLANT
GOWN PREVENTION PLUS LG XLONG (DISPOSABLE) ×3 IMPLANT
GOWN STRL REIN XL XLG (GOWN DISPOSABLE) ×3 IMPLANT
KIT CHEMO SPILL (MISCELLANEOUS) ×3 IMPLANT
KIT ROOM TURNOVER AP CYSTO (KITS) ×3 IMPLANT
LOOP MONOPOLAR YLW (ELECTROSURGICAL) ×3 IMPLANT
MANIFOLD NEPTUNE II (INSTRUMENTS) ×3 IMPLANT
PACK CYSTO (CUSTOM PROCEDURE TRAY) ×3 IMPLANT
PAD ARMBOARD 7.5X6 YLW CONV (MISCELLANEOUS) ×3 IMPLANT
PLUG CATH AND CAP STER (CATHETERS) IMPLANT
SYR 10ML LL (SYRINGE) ×3 IMPLANT
SYRINGE IRR TOOMEY STRL 70CC (SYRINGE) ×3 IMPLANT
TUBE CONNECTING 12'X1/4 (SUCTIONS) ×1
TUBE CONNECTING 12X1/4 (SUCTIONS) ×2 IMPLANT
WATER STERILE IRR 1000ML POUR (IV SOLUTION) ×3 IMPLANT
WATER STERILE IRR 3000ML UROMA (IV SOLUTION) ×5 IMPLANT

## 2016-07-22 NOTE — Anesthesia Postprocedure Evaluation (Signed)
Anesthesia Post Note  Patient: [name] [name]  Procedure(s) Performed: Procedure(s) (LRB): TRANSURETHRAL RESECTION OF BLADDER TUMOR (TURBT) (N/A)  Patient location during evaluation: PACU Anesthesia Type: General Level of consciousness: awake and alert Pain management: satisfactory to patient Vital Signs Assessment: post-procedure vital signs reviewed and stable Respiratory status: spontaneous breathing Cardiovascular status: stable Anesthetic complications: no     Last Vitals:  Vitals:   07/22/16 0810 07/22/16 0815  BP:  125/81  Pulse: 85 84  Resp: 15 15  Temp:      Last Pain:  Vitals:   07/22/16 0820  TempSrc:   PainSc: 4                  Alean Kromer

## 2016-07-22 NOTE — H&P (Signed)
Urology History and Physical Exam  CC: Bladder cancer  HPI: 76 year old male with a history of bladder cancer, status post urgent transurethral resection of his bladder tumor on 06/06/16, presents at this time for repeat TURBT.  He presented to the hospital on 12/15 with long history of gross painless hematuria.  Evaluation revealed clot versus bladder tumor.  He underwent cystoscopy and TURBT of a large tumor volume.  He presents this time for second look, repeat TURBT.  PMH: Past Medical History:  Diagnosis Date  . AAA (abdominal aortic aneurysm) ([place])   . AAA (abdominal aortic aneurysm) ([place])   . Arthritis   . Atherosclerotic peripheral vascular disease ([place])   . Bilateral carotid artery stenosis   . 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 COPD (chronic obstructive pulmonary disease) ([place])   . Hyperlipidemia   . Hypertension   . Joint pain   . Skin cancer   . Thyroid disease    hypothyroid  . Vitamin D deficiency   . Weight gain     PSH: Past Surgical History:  Procedure Laterality Date  . ABDOMINAL AORTIC ANEURYSM REPAIR  08-09-09  . BACK SURGERY    . HYDROCELE EXCISION / REPAIR    . SKIN CANCER EXCISION    . SPINE SURGERY    . TRANSURETHRAL RESECTION OF BLADDER TUMOR N/A 06/06/2016   Procedure: TRANSURETHRAL RESECTION OF BLADDER TUMOR (TURBT);  Surgeon: Franchot Gallo, MD;  Location: [facility] ORS;  Service: Urology;  Laterality: N/A;    Allergies: No Known Allergies  Medications: Prescriptions Prior to Admission  Medication Sig Dispense Refill Last Dose  . alfuzosin (UROXATRAL) 10 MG 24 hr tablet Take 10 mg by mouth at bedtime.    07/21/2016 at Unknown time  . amLODipine (NORVASC) 5 MG tablet Take 1 tablet (5 mg total) by mouth daily. 30 tablet 5 07/22/2016 at 0430  . atorvastatin (LIPITOR) 40 MG tablet Take 1 tablet (40 mg total) by mouth daily. 30 tablet 5 07/22/2016 at 0430  . cholecalciferol (VITAMIN D) 1000 UNITS tablet Take 1 tablet (1,000  Units total) by mouth daily. 30 tablet 6 07/21/2016 at Unknown time  . ibuprofen (ADVIL,MOTRIN) 200 MG tablet Take 400 mg by mouth every 6 (six) hours as needed for mild pain.     Marland Kitchen levothyroxine (SYNTHROID, LEVOTHROID) 75 MCG tablet Take 1 tablet (75 mcg total) by mouth daily. 30 tablet 8 07/22/2016 at 0430  . lisinopril (PRINIVIL,ZESTRIL) 40 MG tablet Take 1 tablet (40 mg total) by mouth daily. 30 tablet 5 07/22/2016 at 0430  . Multiple Vitamin (ONE-A-DAY MENS PO) Take 1 tablet by mouth daily.     07/21/2016 at Unknown time  . Omega-3 Fatty Acids (FISH OIL) 1200 MG CAPS Take 2 capsules by mouth daily.    07/21/2016 at Unknown time     Social History: Social History   Social History  . Marital status: Married    Spouse name: N/A  . Number of children: N/A  . Years of education: N/A   Occupational History  . 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 Smoking status: Former Smoker    Packs/day: 1.00    Years: 51.00    Types: Cigarettes    Start date: 11/25/1954    Quit date: 10/27/2005  . Smokeless tobacco: Never Used  . Alcohol use No     Comment: none since 1999  .  Drug use: No  . Sexual activity: Yes   Other Topics Concern  . Not on file   Social History Narrative  . No narrative on file    Family History: Family History  Problem Relation Age of Onset  . Heart disease Father     Heart Disease before age 58  . Alcohol abuse Father   . Heart attack Father   . Cancer Mother     Lung  . COPD Mother   . COPD Sister   . Lupus Sister   . Hypertension Brother   . Gout Brother   . Heart attack Son   . Gout Brother   . Prostatitis Brother     Review of Systems: Positive: Not applicable Negative:   A further 10 point review of systems was negative except what is listed in the HPI.                  Physical Exam: '@VITALS2'$ @ General: No acute distress.  Awake. Head:  Normocephalic.  Atraumatic. ENT:  EOMI.  Mucous membranes  moist Neck:  Supple.  No lymphadenopathy. CV:  S1 present. S2 present. Regular rate. Pulmonary: Equal effort bilaterally.  Clear to auscultation bilaterally. Abdomen: Soft.  Non- tender to palpation. Skin:  Normal turgor.  No visible rash. Extremity: No gross deformity of bilateral upper extremities.  No gross deformity of                             lower extremities. Neurologic: Alert. Appropriate mood.    Studies:  No results for input(s): HGB, WBC, PLT in the last 72 hours.  No results for input(s): NA, K, CL, CO2, BUN, CREATININE, CALCIUM, GFRNONAA, GFRAA in the last 72 hours.  Invalid input(s): MAGNESIUM   No results for input(s): INR, APTT in the last 72 hours.  Invalid input(s): PT   Invalid input(s): ABG    Assessment:  History of bladder cancer  Plan: Repeat TURBT

## 2016-07-22 NOTE — Op Note (Signed)
Preoperative diagnosis: History of bladder cancer, status post TURBT on 06/06/2016  Postoperative diagnosis: Same  Principal procedure: Cystoscopy, TURBT of 2 centimeter lesion  Surgeon: Monica Zahler  Anesthesia: Gen. with LMA.  , Location: None  Specimen: Biopsy of bladder tumor sites, to pathology  Drain: 43 French Foley  Indications: 76 year old male status post TURBT of large bladder tumors on 06/06/2016.  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  He presents at this time for repeat TURBT for further management of his high-grade bladder cancer.  The procedure as well as risks and complications have been discussed with the patient.  He understands these and desires to proceed.  Description of procedure: The patient was properly identified in the holding area.  He was taken the operating room where general anesthesia was administered with the LMA.  He was placed in the dorsolithotomy position.  Genitalia and perineum were prepped and draped.  Proper timeout was performed.  A 26 French resectoscope sheath was passed with the obturator.  The resectoscope element and small cutting loop were then placed.  Inspection was carried out of the bladder.  There was a small papillary tumor remaining in the right trigone.  This was perhaps 1 centimeter in size.  Surrounding erythema consistent with prior resection.  There was a resection site in the left posterior bladder, just off of the midline.  This was approximately 2 centimeter in size.  There were no other bladder lesions or scars noted.  Using the small cutting loop, the papillary tumor was resected down to the muscular layer.  Surrounding bladder site was cauterized, as well as the base of the resected site.  The entire left posterior bladder tumor site of prior resection was also resected down to the muscular layer with the loop.  Hemostasis was achieved.  There was no evidence of perforation of the  bladder wall in either these 2 sites.  Following resection and cauterization, there was no evidence of bleeding.  At this point, the tumor/biopsy fragments were irrigated from the bladder with the [facility] syringe and sent labeled "bladder tumor site".  The scope was then removed.  A 20 French Foley catheter was inserted, the balloon filled with 10 mL of water.  It was hooked to dependent drainage.  Per the patient tolerated procedure well.  He was then awakened and taken to the PACU in stable condition.

## 2016-07-22 NOTE — Anesthesia Preprocedure Evaluation (Signed)
Anesthesia Evaluation  Patient identified by MRN, date of birth, ID band Patient awake    Reviewed: Allergy & Precautions, NPO status , Patient's Chart, lab work & pertinent test results  Airway Mallampati: II  TM Distance: >3 FB Neck ROM: Full    Dental  (+) Dental Advisory Given, Edentulous Lower, Edentulous Upper   Pulmonary COPD, former smoker,    Pulmonary exam normal breath sounds clear to auscultation       Cardiovascular hypertension, Pt. on medications (-) angina+ Peripheral Vascular Disease  (-) CAD, (-) Past MI and (-) CHF + Valvular Problems/Murmurs AS  Rhythm:Regular Rate:Normal + Systolic murmurs 1/61 Echo: Study Conclusions  - Left ventricle: The cavity size was normal. Wall thickness was   normal. Systolic function was vigorous. The estimated ejection   fraction was in the range of 65% to 70%. Doppler parameters are   consistent with abnormal left ventricular relaxation (grade 1   diastolic dysfunction). - Aortic valve: Mildly calcified annulus. Severely thickened   leaflets. There was severe stenosis. There was mild to moderate   regurgitation. Mean gradient (S): 68 mm Hg. Valve area (VTI):   0.66 cm^2. Valve area (Vmax): 0.62 cm^2. Valve area (Vmean): 0.58   cm^2.    Neuro/Psych negative neurological ROS  negative psych ROS   GI/Hepatic negative GI ROS, Neg liver ROS,   Endo/Other  Hypothyroidism   Renal/GU Renal disease (ARI 2/2 stone obstruction)   Bladder tumor    Musculoskeletal  (+) Arthritis , Osteoarthritis,    Abdominal   Peds  Hematology negative hematology ROS (+)   Anesthesia Other Findings   Reproductive/Obstetrics                             Anesthesia Physical Anesthesia Plan  ASA: IV  Anesthesia Plan: General   Post-op Pain Management:    Induction: Intravenous  Airway Management Planned: LMA  Additional Equipment:   Intra-op Plan:    Post-operative Plan: Extubation in OR  Informed Consent: I have reviewed the patients History and Physical, chart, labs and discussed the procedure including the risks, benefits and alternatives for the proposed anesthesia with the patient or authorized representative who has indicated his/her understanding and acceptance.     Plan Discussed with: CRNA  Anesthesia Plan Comments: (Risks/benefits of general anesthesia discussed with patient including risk of damage to teeth, lips, gum, and tongue, nausea/vomiting, allergic reactions to medications, and the possibility of heart attack, stroke and death.  All patient questions answered.  Patient wishes to proceed.)        Anesthesia Quick Evaluation

## 2016-07-22 NOTE — Transfer of Care (Signed)
Immediate Anesthesia Transfer of Care Note  Patient: [name] [name]  Procedure(s) Performed: Procedure(s): TRANSURETHRAL RESECTION OF BLADDER TUMOR (TURBT) (N/A)  Patient Location: PACU  Anesthesia Type:General  Level of Consciousness: awake and patient cooperative  Airway & Oxygen Therapy: Patient Spontanous Breathing and non-rebreather face mask  Post-op Assessment: Report given to RN and Post -op Vital signs reviewed and stable  Post vital signs: Reviewed  Last Vitals:  Vitals:   07/22/16 0715 07/22/16 0720  BP:    Pulse:    Resp: 13 (!) 9  Temp:      Last Pain:  Vitals:   07/22/16 0645  TempSrc: Oral      Patients Stated Pain Goal: 6 (16/10/96 0454)  Complications: No apparent anesthesia complications

## 2016-07-22 NOTE — Discharge Instructions (Signed)
1. You may see some blood in the urine and may have some burning with urination for 48-72 hours. You also may notice that you have to urinate more frequently or urgently after your procedure which is normal.  2. You should call should you develop an inability urinate, fever > 101, persistent nausea and vomiting that prevents you from eating or drinking to stay hydrated.  3. If you have a catheter, you will be taught how to take care of the catheter by the nursing staff prior to discharge from the hospital.  You may periodically feel a strong urge to void with the catheter in place.  This is a bladder spasm and most often can occur when having a bowel movement or moving around. It is typically self-limited and usually will stop after a few minutes.  You may use some Vaseline or Neosporin around the tip of the catheter to reduce friction at the tip of the penis. You may also see some blood in the urine.  A very small amount of blood can make the urine look quite red.  As long as the catheter is draining well, there usually is not a problem.  However, if the catheter is not draining well and is bloody, you should call the office [phone]) to notify us.  It is okay to remove the catheter on Wednesday morning.  Follow the nurses instructions on how to do this.   Transurethral Resection of Bladder Tumor, Care After Refer to this sheet in the next few weeks. These instructions provide you with information about caring for yourself after your procedure. Your health care provider may also give you more specific instructions. Your treatment has been planned according to current medical practices, but problems sometimes occur. Call your health care provider if you have any problems or questions after your procedure. What can I expect after the procedure? After the procedure, it is common to have:  A small amount of blood in your urine for up to 2 weeks.  Soreness or mild discomfort from your catheter. After  your catheter is removed, you may have mild soreness, especially when urinating.  Pain in your lower abdomen. Follow these instructions at home:   Medicines  Take over-the-counter and prescription medicines only as told by your health care provider.  Do not drive or operate heavy machinery while taking prescription pain medicine.  Do not drive for 24 hours if you received a sedative.  If you were prescribed antibiotic medicine, take it as told by your health care provider. Do not stop taking the antibiotic even if you start to feel better. Activity  Return to your normal activities as told by your health care provider. Ask your health care provider what activities are safe for you.  Do not lift anything that is heavier than 10 lb (4.5 kg) for as long as told by your health care provider.  Avoid intense physical activity for as long as told by your health care provider.  Walk at least one time every day. This helps to prevent blood clots. You may increase your physical activity gradually as you start to feel better. General instructions  Do not drink alcohol for as long as told by your health care provider. This is especially important if you are taking prescription pain medicines.  Do not take baths, swim, or use a hot tub until your health care provider approves.  If you have a catheter, follow instructions from your health care provider about caring for your catheter  and your drainage bag.  Drink enough fluid to keep your urine clear or pale yellow.  Wear compression stockings as told by your health care provider. These stockings help to prevent blood clots and reduce swelling in your legs.  Keep all follow-up visits as told by your health care provider. This is important. Contact a health care provider if:  You have pain that gets worse or does not improve with medicine.  You have blood in your urine for more than 2 weeks.  You have cloudy or bad-smelling urine.  You  become constipated. Signs of constipation may include having:  Fewer than three bowel movements in a week.  Difficulty having a bowel movement.  Stools that are dry, hard, or larger than normal.  You have a fever. Get help right away if:  You have:  Severe pain.  Bright-red blood in your urine.  Blood clots in your urine.  A lot of blood in your urine.  Your catheter has been removed and you are not able to urinate.  You have a catheter in place and the catheter is not draining urine. This information is not intended to replace advice given to you by your health care provider. Make sure you discuss any questions you have with your health care provider. Document Released: 05/21/2015 Document Revised: 02/10/2016 Document Reviewed: 03/01/2015 Elsevier Interactive Patient Education  2017 [employer].

## 2016-07-22 NOTE — Anesthesia Procedure Notes (Signed)
Procedure Name: LMA Insertion Date/Time: 07/22/2016 7:30 AM Performed by: Vista Deck Pre-anesthesia Checklist: Patient identified, Patient being monitored, Emergency Drugs available, Timeout performed and Suction available Patient Re-evaluated:Patient Re-evaluated prior to inductionOxygen Delivery Method: Circle System Utilized Preoxygenation: Pre-oxygenation with 100% oxygen Intubation Type: IV induction Ventilation: Mask ventilation without difficulty LMA: LMA inserted LMA Size: 4.0 Number of attempts: 1 Placement Confirmation: positive ETCO2 and breath sounds checked- equal and bilateral Tube secured with: Tape

## 2016-07-23 ENCOUNTER — Encounter (HOSPITAL_COMMUNITY): Payer: Self-pay | Admitting: Urology

## 2016-07-28 ENCOUNTER — Ambulatory Visit (INDEPENDENT_AMBULATORY_CARE_PROVIDER_SITE_OTHER): Payer: Medicare Other | Admitting: Family

## 2016-07-28 ENCOUNTER — Other Ambulatory Visit: Payer: Self-pay | Admitting: Surgery

## 2016-07-28 ENCOUNTER — Encounter: Payer: Self-pay | Admitting: Family

## 2016-07-28 ENCOUNTER — Ambulatory Visit (INDEPENDENT_AMBULATORY_CARE_PROVIDER_SITE_OTHER)
Admission: RE | Admit: 2016-07-28 | Discharge: 2016-07-28 | Disposition: A | Discharge status: Home / Self Care | Payer: Medicare Other | Source: Ambulatory Visit | Attending: Family | Admitting: Family

## 2016-07-28 ENCOUNTER — Other Ambulatory Visit: Payer: Self-pay | Admitting: *Deleted

## 2016-07-28 ENCOUNTER — Ambulatory Visit (HOSPITAL_COMMUNITY)
Admission: RE | Admit: 2016-07-28 | Discharge: 2016-07-28 | Disposition: A | Discharge status: Home / Self Care | Payer: Medicare Other | Source: Ambulatory Visit | Attending: Family | Admitting: Family

## 2016-07-28 VITALS — BP 132/73 | HR 95 | Temp 97.3°F | Resp 16 | Ht 70.5 in | Wt 168.0 lb

## 2016-07-28 DIAGNOSIS — I739 Peripheral vascular disease, unspecified: Principal | ICD-10-CM

## 2016-07-28 DIAGNOSIS — I779 Disorder of arteries and arterioles, unspecified: Secondary | ICD-10-CM | POA: Diagnosis not present

## 2016-07-28 DIAGNOSIS — I714 Abdominal aortic aneurysm, without rupture, unspecified: Secondary | ICD-10-CM

## 2016-07-28 DIAGNOSIS — Z95828 Presence of other vascular implants and grafts: Secondary | ICD-10-CM | POA: Diagnosis not present

## 2016-07-28 DIAGNOSIS — I6529 Occlusion and stenosis of unspecified carotid artery: Secondary | ICD-10-CM

## 2016-07-28 DIAGNOSIS — I6523 Occlusion and stenosis of bilateral carotid arteries: Secondary | ICD-10-CM | POA: Diagnosis not present

## 2016-07-28 NOTE — Patient Instructions (Addendum)
Stroke Prevention Some medical conditions and behaviors are associated with an increased chance of having a stroke. You may prevent a stroke by making healthy choices and managing medical conditions. How can I reduce my risk of having a stroke?  Stay physically active. Get at least 30 minutes of activity on most or all days.  Do not smoke. It may also be helpful to avoid exposure to secondhand smoke.  Limit alcohol use. Moderate alcohol use is considered to be:  No more than 2 drinks per day for men.  No more than 1 drink per day for nonpregnant women.  Eat healthy foods. This involves:  Eating 5 or more servings of fruits and vegetables a day.  Making dietary changes that address high blood pressure (hypertension), high cholesterol, diabetes, or obesity.  Manage your cholesterol levels.  Making food choices that are high in fiber and low in saturated fat, trans fat, and cholesterol may control cholesterol levels.  Take any prescribed medicines to control cholesterol as directed by your health care provider.  Manage your diabetes.  Controlling your carbohydrate and sugar intake is recommended to manage diabetes.  Take any prescribed medicines to control diabetes as directed by your health care provider.  Control your hypertension.  Making food choices that are low in salt (sodium), saturated fat, trans fat, and cholesterol is recommended to manage hypertension.  Ask your health care provider if you need treatment to lower your blood pressure. Take any prescribed medicines to control hypertension as directed by your health care provider.  If you are 18-39 years of age, have your blood pressure checked every 3-5 years. If you are 40 years of age or older, have your blood pressure checked every year.  Maintain a healthy weight.  Reducing calorie intake and making food choices that are low in sodium, saturated fat, trans fat, and cholesterol are recommended to manage  weight.  Stop drug abuse.  Avoid taking birth control pills.  Talk to your health care provider about the risks of taking birth control pills if you are over 35 years old, smoke, get migraines, or have ever had a blood clot.  Get evaluated for sleep disorders (sleep apnea).  Talk to your health care provider about getting a sleep evaluation if you snore a lot or have excessive sleepiness.  Take medicines only as directed by your health care provider.  For some people, aspirin or blood thinners (anticoagulants) are helpful in reducing the risk of forming abnormal blood clots that can lead to stroke. If you have the irregular heart rhythm of atrial fibrillation, you should be on a blood thinner unless there is a good reason you cannot take them.  Understand all your medicine instructions.  Make sure that other conditions (such as anemia or atherosclerosis) are addressed. Get help right away if:  You have sudden weakness or numbness of the face, arm, or leg, especially on one side of the body.  Your face or eyelid droops to one side.  You have sudden confusion.  You have trouble speaking (aphasia) or understanding.  You have sudden trouble seeing in one or both eyes.  You have sudden trouble walking.  You have dizziness.  You have a loss of balance or coordination.  You have a sudden, severe headache with no known cause.  You have new chest pain or an irregular heartbeat. Any of these symptoms may represent a serious problem that is an emergency. Do not wait to see if the symptoms will go away.   Get medical help at once. Call your local emergency services (911 in [place] Do not drive yourself to the hospital.  This information is not intended to replace advice given to you by your health care provider. Make sure you discuss any questions you have with your health care provider. Document Released: 07/17/2004 Document Revised: 11/15/2015 Document Reviewed: 12/10/2012 Elsevier  Interactive Patient Education  2017 [place].      Before your next abdominal ultrasound:  Take two Extra-Strength Gas-X capsules at bedtime the night before the test. Take another two Extra-Strength Gas-X capsules 3 hours before the test.

## 2016-07-28 NOTE — Progress Notes (Signed)
[facility] [place]  CC: Follow up s/p EVAR  History of Present Illness  [name] [name] is a 76 y.o. ([birth year]) male patient of Dr. Trula Slade who is status post aortic stent graft repair in February 2011. He also has mild extracranial carotid artery stenosis.   He returns for routine follow up.  The patient has not had new back or abdominal pain. He does have mild lumbar disc issues with a history of lumbar spine surgery. He denies claudication symptoms with walking, denies non-healing wounds other than addressed skin cancer on his shoulder which has healed well.  He is regularly physically active and watches his diet.  He denies any history of stroke or TIA symptoms.  He is seeing a cardiologist for monitoring of aortic valve stenosis and murmur, Dr. Jacinta Shoe.   Pt Diabetic: No Pt smoker: former smoker, quit 2007  Pt meds include: Statin : Yes ASA: no, will resume sometime after his bladder surgery that he had 06-06-16 and 07-22-16.  Other anticoagulants/antiplatelets: no    Past Medical History:  Diagnosis Date  . AAA (abdominal aortic aneurysm) ([place])   . AAA (abdominal aortic aneurysm) ([place])   . Arthritis   . Atherosclerotic peripheral vascular disease ([place])   . Bilateral carotid artery stenosis   . Cancer [facility]) 2006 and Nov. 9, 2013    [facility], right shoulder  . COPD (chronic obstructive pulmonary disease) ([place] [facility])   . Hyperlipidemia   . Hypertension   . Joint pain   . Skin cancer   . Thyroid disease    hypothyroid  . Vitamin D deficiency   . Weight gain    Past Surgical History:  Procedure Laterality Date  . ABDOMINAL AORTIC ANEURYSM REPAIR  08-09-09  . BACK SURGERY    . HYDROCELE EXCISION / REPAIR    . SKIN CANCER EXCISION    . SPINE SURGERY    . TRANSURETHRAL RESECTION OF BLADDER TUMOR N/A 06/06/2016   Procedure: TRANSURETHRAL RESECTION OF BLADDER TUMOR (TURBT);  Surgeon: Franchot Gallo, MD;  Location: [facility];  Service:  Urology;  Laterality: N/A;  . TRANSURETHRAL RESECTION OF BLADDER TUMOR N/A 07/22/2016   Procedure: TRANSURETHRAL RESECTION OF BLADDER TUMOR (TURBT);  Surgeon: Franchot Gallo, MD;  Location: [facility];  Service: Urology;  Laterality: N/A;   Social History Social History  Substance Use Topics  . Smoking status: Former Smoker    Packs/day: 1.00    Years: 51.00    Types: Cigarettes    Start date: 11/25/1954    Quit date: 10/27/2005  . Smokeless tobacco: Never Used  . Alcohol use No     Comment: none since 1999   Family History Family History  Problem Relation Age of Onset  . Heart disease Father     Heart Disease before age 28  . Alcohol abuse Father   . Heart attack Father   . Cancer Mother     Lung  . COPD Mother   . COPD Sister   . Lupus Sister   . Hypertension Brother   . Gout Brother   . Heart attack Son   . Gout Brother   . Prostatitis Brother    Current Outpatient Prescriptions on File Prior to Visit  Medication Sig Dispense Refill  . alfuzosin (UROXATRAL) 10 MG 24 hr tablet Take 10 mg by mouth at bedtime.     Marland Kitchen amLODipine (NORVASC) 5 MG tablet Take 1 tablet (5 mg total) by mouth daily. 30 tablet 5  . atorvastatin (LIPITOR)  40 MG tablet Take 1 tablet (40 mg total) by mouth daily. 30 tablet 5  . cholecalciferol (VITAMIN D) 1000 UNITS tablet Take 1 tablet (1,000 Units total) by mouth daily. 30 tablet 6  . ibuprofen (ADVIL,MOTRIN) 200 MG tablet Take 400 mg by mouth every 6 (six) hours as needed for mild pain.    Marland Kitchen levothyroxine (SYNTHROID, LEVOTHROID) 75 MCG tablet Take 1 tablet (75 mcg total) by mouth daily. 30 tablet 8  . lisinopril (PRINIVIL,ZESTRIL) 40 MG tablet Take 1 tablet (40 mg total) by mouth daily. 30 tablet 5  . Multiple Vitamin (ONE-A-DAY MENS PO) Take 1 tablet by mouth daily.      . Omega-3 Fatty Acids (FISH OIL) 1200 MG CAPS Take 2 capsules by mouth daily.     . cephALEXin (KEFLEX) 500 MG capsule Take 1 capsule (500 mg total) by mouth 2 (two) times daily.  (Patient not taking: Reported on 07/28/2016) 6 capsule 0   No current facility-administered medications on file prior to visit.    No Known Allergies   ROS: See HPI for pertinent positives and negatives.  Physical Examination  Vitals:   07/28/16 0930 07/28/16 0931  BP: 130/72 132/73  Pulse: 95 95  Resp: 16   Temp: 97.3 F (36.3 C)   SpO2: 98%   Weight: 168 lb (76.2 kg)   Height: 5' 10.5" (1.791 m)    Body mass index is 23.76 kg/m.  General: A&O x 3, WD.  Pulmonary: Sym exp, respirations are non labored, good air movement in all fields, CTAB, no rales, rhonchi, or wheezing.  Cardiac: RRR, Nl S1, S2, + aortic murmur.  Vascular: Vessel Right Left  Radial 2+Palpable 2+Palpable  Carotid Cardiac murmur transmitted Cardiac murmur transmitted  Aorta Not palpable N/A  Femoral Palpable Palpable  Popliteal Not palpable Not palpable  PT Not palpable Not Palpable  DP Not Palpable Not Palpable   Gastrointestinal: soft, NTND, -G/R, - HSM, - palpable masses, - CVAT B.  Musculoskeletal: M/S 5/5 throughout, Extremities without ischemic changes.  Neurologic: Pain and light touch intact in extremities, Motor exam as listed above    CTA Abd/Pelvis Duplex (Date: 09/30/10) . AAA stent remains in good position with no complicating features. No endovascular leak is seen. 2. Decrease in size of native aneurysm sac, measuring 42 x 34 mm compared to prior measurements of 51 x 46 mm. 3. Question of slight focal thickening of the left urinary bladder wall. Consider cystoscopy if further assessment is warranted clinically. 4. Multiple rectosigmoid colonic diverticula.    Non-Invasive Vascular Imaging  EVAR Duplex (Date: 07-28-2016)  AAA sac size: 3.98 cm, Right CIA: 1.15; Left CIA: 1.19.   no endoleak detected. Technically difficult and limited exam due to bowel gas.  07-23-2015: AAA sac size: 4.0 cm   Carotid Duplex  07-28-2016: <40% bilateral ICA stenosis. Bilateral vertebral artery flow is antegrade.  Bilateral subclavian artery waveforms are normal.  No significant change compared to the last exam on 07-23-15.  ABI (Date: 07/23/2015)  R: 1.12 (no previous ABI), DP: tri, PT: tri, TBI: 0.79  L: 1.14 (N/A), DP: tri, PT: tri, TBI: 0.84   Medical Decision Making  [name] [name] is a 76 y.o. male who is status post aortic stent graft repair in February 2011. Decrease in AAA sac size to 3.98 cm, based on limited visualization due to bowel gas.  He also has mild extracranial carotid artery stenosis.  He has no back or abdominal pain.   He has no hx  of stroke or TIA.  His pedal pulses are not palpable but he had normal ABI's with all triphasic waveforms in January 2017. His feet are pink and warm.    I discussed with the patient the importance of surveillance of the endograft.  The next endograft duplex will be scheduled for 12 months.  The patient will follow up with [place] in 12 months with these studies.  Carotid duplex in 3 years.   I emphasized the importance of maximal medical management including strict control of blood pressure, blood glucose, and lipid levels, antiplatelet agents, obtaining regular exercise, and cessation of smoking.   Thank you for allowing [place] to participate in this patient's care.  Clemon Chambers, RN, MSN, FNP-C [facility] [place] Office: [facility] Physician: Trula Slade  07/28/2016, 9:39 AM

## 2016-07-29 ENCOUNTER — Ambulatory Visit (INDEPENDENT_AMBULATORY_CARE_PROVIDER_SITE_OTHER): Payer: Medicare Other | Admitting: Urology

## 2016-07-29 DIAGNOSIS — C678 Malignant neoplasm of overlapping sites of bladder: Secondary | ICD-10-CM | POA: Diagnosis not present

## 2016-07-30 NOTE — Addendum Note (Signed)
Addended by: Lianne Cure A on: 07/30/2016 12:30 PM   Modules accepted: Orders

## 2016-08-12 ENCOUNTER — Ambulatory Visit (INDEPENDENT_AMBULATORY_CARE_PROVIDER_SITE_OTHER): Payer: Medicare Other | Admitting: Urology

## 2016-08-12 DIAGNOSIS — C678 Malignant neoplasm of overlapping sites of bladder: Secondary | ICD-10-CM | POA: Diagnosis not present

## 2016-08-20 ENCOUNTER — Ambulatory Visit (INDEPENDENT_AMBULATORY_CARE_PROVIDER_SITE_OTHER): Payer: Medicare Other | Admitting: Urology

## 2016-08-20 DIAGNOSIS — C678 Malignant neoplasm of overlapping sites of bladder: Secondary | ICD-10-CM

## 2016-08-25 ENCOUNTER — Ambulatory Visit (INDEPENDENT_AMBULATORY_CARE_PROVIDER_SITE_OTHER): Payer: Medicare Other | Admitting: Cardiovascular Disease

## 2016-08-25 ENCOUNTER — Encounter: Payer: Self-pay | Admitting: Cardiovascular Disease

## 2016-08-25 VITALS — BP 130/68 | HR 69 | Ht 70.0 in | Wt 179.0 lb

## 2016-08-25 DIAGNOSIS — I10 Essential (primary) hypertension: Secondary | ICD-10-CM | POA: Diagnosis not present

## 2016-08-25 DIAGNOSIS — I739 Peripheral vascular disease, unspecified: Secondary | ICD-10-CM | POA: Diagnosis not present

## 2016-08-25 DIAGNOSIS — E7800 Pure hypercholesterolemia, unspecified: Secondary | ICD-10-CM

## 2016-08-25 DIAGNOSIS — I350 Nonrheumatic aortic (valve) stenosis: Secondary | ICD-10-CM

## 2016-08-25 NOTE — Progress Notes (Signed)
SUBJECTIVE: The patient presents for follow-up of severe aortic stenosis. He also has a history of hypertension, hyperlipidemia, nonobstructive carotid artery disease, COPD, AAA s/p endovascular repair, and hypothyroidism.  He continues to feel well and remains very active and denies chest pain, shortness of breath, lightheadedness, dizziness, leg swelling, and syncope.  Echocardiogram 07/02/16 showed normal left ventricular systolic function, EF 02-40%, grade 1 diastolic dysfunction, severe aortic stenosis, mean gradient 54 mmHg (previously 68 mmHg), and mild aortic regurgitation.  He has a history of high-grade, non-muscle invasive bladder cancer and underwent surgery on 06/06/16. He undergoes treatments every Tuesday and has 2 more.   He will begin golfing in the near future. He had been moving heavy rocks last week and had some mild shortness of breath which quickly resolved with rest.     Review of Systems: As per "subjective", otherwise negative.  No Known Allergies  Current Outpatient Prescriptions  Medication Sig Dispense Refill  . alfuzosin (UROXATRAL) 10 MG 24 hr tablet Take 10 mg by mouth at bedtime.     Marland Kitchen amLODipine (NORVASC) 5 MG tablet Take 1 tablet (5 mg total) by mouth daily. 30 tablet 5  . atorvastatin (LIPITOR) 40 MG tablet Take 1 tablet (40 mg total) by mouth daily. 30 tablet 5  . cholecalciferol (VITAMIN D) 1000 UNITS tablet Take 1 tablet (1,000 Units total) by mouth daily. 30 tablet 6  . ibuprofen (ADVIL,MOTRIN) 200 MG tablet Take 400 mg by mouth every 6 (six) hours as needed for mild pain.    Marland Kitchen levothyroxine (SYNTHROID, LEVOTHROID) 75 MCG tablet Take 1 tablet (75 mcg total) by mouth daily. 30 tablet 8  . lisinopril (PRINIVIL,ZESTRIL) 40 MG tablet Take 1 tablet (40 mg total) by mouth daily. 30 tablet 5  . Multiple Vitamin (ONE-A-DAY MENS PO) Take 1 tablet by mouth daily.      . Omega-3 Fatty Acids (FISH OIL) 1200 MG CAPS Take 2 capsules by mouth daily.     Marland Kitchen  UNABLE TO FIND Med Name: bacille calmette guercu 6 weekly treatments for 3-4 months for bladder cancer     No current facility-administered medications for this visit.     Past Medical History:  Diagnosis Date  . AAA (abdominal aortic aneurysm) ([place])   . AAA (abdominal aortic aneurysm) ([place])   . Arthritis   . Atherosclerotic peripheral vascular disease ([place])   . Bilateral carotid artery stenosis   . Cancer [facility]) 2006 and Nov. 9, 2013    [facility], right shoulder  . COPD (chronic obstructive pulmonary disease) ([place])   . Hyperlipidemia   . Hypertension   . Joint pain   . Skin cancer   . Thyroid disease    hypothyroid  . Vitamin D deficiency   . Weight gain     Past Surgical History:  Procedure Laterality Date  . ABDOMINAL AORTIC ANEURYSM REPAIR  08-09-09  . BACK SURGERY    . HYDROCELE EXCISION / REPAIR    . SKIN CANCER EXCISION    . SPINE SURGERY    . TRANSURETHRAL RESECTION OF BLADDER TUMOR N/A 06/06/2016   Procedure: TRANSURETHRAL RESECTION OF BLADDER TUMOR (TURBT);  Surgeon: Franchot Gallo, MD;  Location: [facility];  Service: Urology;  Laterality: N/A;  . TRANSURETHRAL RESECTION OF BLADDER TUMOR N/A 07/22/2016   Procedure: TRANSURETHRAL RESECTION OF BLADDER TUMOR (TURBT);  Surgeon: Franchot Gallo, MD;  Location: [facility];  Service: Urology;  Laterality: N/A;    Social History   Social History  . Marital  status: Married    Spouse name: N/A  . Number of children: N/A  . Years of education: N/A   Occupational History  . 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 Smoking status: Former Smoker    Packs/day: 1.00    Years: 51.00    Types: Cigarettes    Start date: 11/25/1954    Quit date: 10/27/2005  . Smokeless tobacco: Never Used  . Alcohol use No     Comment: none since 1999  . Drug use: No  . Sexual activity: Yes   Other Topics Concern  . Not on file   Social History Narrative  . No narrative on file     Vitals:   08/25/16  1035  BP: 130/68  Pulse: 69  SpO2: 98%  Weight: 179 lb (81.2 kg)  Height: '5\' 10"'$  (1.778 m)    PHYSICAL EXAM General: NAD Neck: No JVD, no thyromegaly or thyroid nodule.  Lungs: Diminished but clear with normal respiratory effort. CV: Nondisplaced PMI. Regular rate and rhythm, normal S1/diminished S2, no S3/S4, late-peaking IV/VI ejection systolic murmur. No peripheral edema.  Abdomen: Soft, nontender, no distention.  Skin: Intact without lesions or rashes.  Neurologic: Alert and oriented x 3.  Psych: Normal affect. Extremities: No clubbing or cyanosis.  HEENT: Normal.     ECG: Most recent ECG reviewed.      ASSESSMENT AND PLAN: 1. Severe aortic stenosis: Symptomatically stable. Echo reviewed above. I previously had an extensive discussion with him about the natural history of aortic stenosis. I informed him of symptoms he would likely experience in the future including chest pain, shortness of breath, lightheadedness, dizziness, leg swelling, fatigue, and possibly syncope. I asked him to call me immediately should he experience any of these symptoms as he will eventually require aortic valve replacement. The patient is understanding and in agreement with this plan.  2. Essential HTN: Controlled on amlodipine 5 mg and lisinopril 40 mg in context of severe AS. No changes.  3. Hyperlipidemia: On high-intensity statin therapy with Lipitor 40 mg.  4. PVD: Followed by vascular surgery.  Dispo: f/u July 2018   Kate Sable, M.D., F.A.C.C.

## 2016-08-25 NOTE — Patient Instructions (Signed)

## 2016-08-26 ENCOUNTER — Ambulatory Visit: Payer: Medicare Other | Admitting: Urology

## 2016-08-26 ENCOUNTER — Ambulatory Visit (INDEPENDENT_AMBULATORY_CARE_PROVIDER_SITE_OTHER): Payer: Medicare Other | Admitting: Urology

## 2016-08-26 DIAGNOSIS — C678 Malignant neoplasm of overlapping sites of bladder: Secondary | ICD-10-CM

## 2016-09-02 ENCOUNTER — Ambulatory Visit (INDEPENDENT_AMBULATORY_CARE_PROVIDER_SITE_OTHER): Payer: Medicare Other | Admitting: Urology

## 2016-09-02 DIAGNOSIS — C678 Malignant neoplasm of overlapping sites of bladder: Secondary | ICD-10-CM | POA: Diagnosis not present

## 2016-09-09 ENCOUNTER — Ambulatory Visit (INDEPENDENT_AMBULATORY_CARE_PROVIDER_SITE_OTHER): Payer: Medicare Other | Admitting: Urology

## 2016-09-09 DIAGNOSIS — C678 Malignant neoplasm of overlapping sites of bladder: Secondary | ICD-10-CM | POA: Diagnosis not present

## 2016-09-16 ENCOUNTER — Ambulatory Visit (INDEPENDENT_AMBULATORY_CARE_PROVIDER_SITE_OTHER): Payer: Medicare Other | Admitting: Urology

## 2016-09-16 DIAGNOSIS — C678 Malignant neoplasm of overlapping sites of bladder: Secondary | ICD-10-CM

## 2016-10-06 ENCOUNTER — Other Ambulatory Visit: Payer: Self-pay | Admitting: Nurse Practitioner

## 2016-10-06 DIAGNOSIS — E785 Hyperlipidemia, unspecified: Secondary | ICD-10-CM

## 2016-10-20 ENCOUNTER — Ambulatory Visit (INDEPENDENT_AMBULATORY_CARE_PROVIDER_SITE_OTHER): Payer: Medicare Other | Admitting: Nurse Practitioner

## 2016-10-20 ENCOUNTER — Ambulatory Visit (INDEPENDENT_AMBULATORY_CARE_PROVIDER_SITE_OTHER): Payer: Medicare Other

## 2016-10-20 ENCOUNTER — Encounter: Payer: Self-pay | Admitting: Nurse Practitioner

## 2016-10-20 VITALS — BP 119/58 | HR 65 | Temp 96.9°F | Ht 70.0 in | Wt 180.0 lb

## 2016-10-20 DIAGNOSIS — E559 Vitamin D deficiency, unspecified: Secondary | ICD-10-CM | POA: Diagnosis not present

## 2016-10-20 DIAGNOSIS — Z6826 Body mass index (BMI) 26.0-26.9, adult: Secondary | ICD-10-CM | POA: Diagnosis not present

## 2016-10-20 DIAGNOSIS — E034 Atrophy of thyroid (acquired): Secondary | ICD-10-CM | POA: Diagnosis not present

## 2016-10-20 DIAGNOSIS — I10 Essential (primary) hypertension: Secondary | ICD-10-CM | POA: Diagnosis not present

## 2016-10-20 DIAGNOSIS — J449 Chronic obstructive pulmonary disease, unspecified: Secondary | ICD-10-CM | POA: Diagnosis not present

## 2016-10-20 DIAGNOSIS — I350 Nonrheumatic aortic (valve) stenosis: Secondary | ICD-10-CM

## 2016-10-20 DIAGNOSIS — I6523 Occlusion and stenosis of bilateral carotid arteries: Secondary | ICD-10-CM | POA: Diagnosis not present

## 2016-10-20 DIAGNOSIS — R0602 Shortness of breath: Secondary | ICD-10-CM

## 2016-10-20 DIAGNOSIS — E782 Mixed hyperlipidemia: Secondary | ICD-10-CM

## 2016-10-20 MED ORDER — AMLODIPINE BESYLATE 5 MG PO TABS: 5.0000 mg | ORAL_TABLET | Freq: Every day | ORAL | 5 refills | Status: DC

## 2016-10-20 MED ORDER — ATORVASTATIN CALCIUM 40 MG PO TABS: 40.0000 mg | ORAL_TABLET | Freq: Every day | ORAL | 5 refills | Status: DC

## 2016-10-20 MED ORDER — LISINOPRIL 40 MG PO TABS: 40.0000 mg | ORAL_TABLET | Freq: Every day | ORAL | 5 refills | Status: DC

## 2016-10-20 MED ORDER — LEVOTHYROXINE SODIUM 75 MCG PO TABS: 75.0000 ug | ORAL_TABLET | Freq: Every day | ORAL | 8 refills | Status: DC

## 2016-10-20 MED ORDER — FLUTICASONE FUROATE-VILANTEROL 100-25 MCG/INH IN AEPB: 1.0000 | INHALATION_SPRAY | Freq: Every day | RESPIRATORY_TRACT | 0 refills | Status: DC

## 2016-10-20 NOTE — Patient Instructions (Signed)
Aortic Valve Stenosis Aortic valve stenosis is a narrowing of the aortic valve. The aortic valve opens and closes to regulate blood flow between the lower left chamber of the heart (left ventricle) and the blood vessel that leads away from the heart (aorta). When the aortic valve becomes narrow, it makes it difficult for the heart to pump blood into the aorta, which causes the heart to work harder. The extra work can weaken the heart over time. Aortic valve stenosis can range from mild to severe. If untreated, it can become more severe over time and can lead to heart failure. What are the causes? This condition may be caused by:  Buildup of calcium around and on the valve. This can occur with aging. This is the most common cause of aortic valve stenosis.  Birth defect.  Rheumatic fever.  Radiation to the chest. What increases the risk? You may be more likely to develop this condition if:  You are over the age of 70.  You were born with an abnormal bicuspid valve. What are the signs or symptoms? You may have no symptoms until your condition becomes severe. It may take 10-20 years for mild or moderate aortic valve stenosis to become severe. Symptoms may include:  Shortness of breath. This may get worse during physical activity.  Feeling unusually weak and tired (fatigue).  Extreme discomfort in the chest, neck, or arm (angina).  A heartbeat that is irregular or faster than normal (palpitations).  Dizziness or fainting. This may happen when you get physically tired or after you take certain heart medicines, such as nitroglycerin. How is this diagnosed? This condition may be diagnosed with:  A physical exam.  Echocardiogram. This is a type of imaging test that uses sound waves (ultrasound) to make an image of your heart. There are two types that may be used:  Transthoracic echocardiogram (TTE). This type of echocardiogram is noninvasive, and it is usually done  first.  Transesophageal echocardiogram (TEE). This type of echocardiogram is done by passing a flexible tube down your esophagus. The heart and the esophagus are close to each other, so your health care provider can take very clear, detailed pictures of the heart using this type of test.  Cardiac catheterization. In this procedure, a thin, flexible tube (catheter) is passed through a large vein in your neck, groin, or arm. This procedure provides information about arteries, structures, blood pressure, and oxygen levels in your heart.  Electrocardiogram (ECG). This records the electrical impulses of your heart and assesses heart function.  Stress tests. These are tests that evaluate the blood supply to your heart and your heart's response to exercise.  Blood tests. You may work with a health care provider who specializes in the heart (cardiologist). How is this treated? Treatment depends on how severe your condition is and what your symptoms are. You will need to have your heart checked regularly to make sure that your condition is not getting worse or causing serious problems. If your condition is mild, no treatment may be needed. Treatment may include:  Medicines that help keep your heart rate regular.  Medicines that thin your blood (anticoagulants) to prevent the formation of blood clots.  Antibiotic medicines to help prevent infection.  Surgery to replace your aortic valve. This is the most common treatment for aortic valve stenosis. Several types of surgeries are available. The surgery may be done through a large incision over your heart (open heart surgery), or it may be done using a minimally  invasive technique (transcatheter aortic valve replacement, or TAVR). Follow these instructions at home: Lifestyle    Limit alcohol intake to no more than 1 drink per day for nonpregnant women and 2 drinks per day for men. One drink equals 12 oz of beer, 5 oz of wine, or 1 oz of hard  liquor.  Do not use any tobacco products, such as cigarettes, chewing tobacco, or e-cigarettes. If you need help quitting, ask your health care provider.  Work with your health care provider to manage your blood pressure and cholesterol.  Maintain a healthy weight. Eating and drinking   Follow instructions from your health care provider about eating or drinking restrictions.  Limit how much caffeine you drink. Caffeine can affect your heart's rate and rhythm.  Drink enough fluid to keep your urine clear or pale yellow.  Eat a heart-healthy diet. This should include plenty of fresh fruits and vegetables. If you eat meat, it should be lean cuts. Avoid foods that are:  High in salt, saturated fat, or sugar.  Canned or highly processed.  [place] Pro. Activity   Return to your normal activities as told by your health care provider. Ask your health care provider what activities are safe for you.  Exercise regularly, as told by your health care provider. Ask your health care provider what types of exercise are safe for you.  If your aortic valve stenosis is mild, you may need to avoid only very intense physical activity. The more severe your aortic valve stenosis is, the more activities you may need to avoid. General instructions   Take over-the-counter and prescription medicines only as told by your health care provider.  If you are a woman and you plan to become pregnant, talk with your health care provider before you become pregnant.  Tell all health care providers who care for you that you have aortic valve stenosis.  Keep all follow-up visits as told by your health care provider. This is important. Contact a health care provider if:  You have a fever. Get help right away if:  You develop chest pain or tightness.  You develop shortness of breath or difficulty breathing.  You feel light-headed.  You feel like you might faint.  Your heartbeat is irregular or faster than  normal. These symptoms may represent a serious problem that is an emergency. Do not wait to see if the symptoms will go away. Get medical help right away. Call your local emergency services (911 in the [place] Do not drive yourself to the hospital. This information is not intended to replace advice given to you by your health care provider. Make sure you discuss any questions you have with your health care provider. Document Released: 03/08/2003 Document Revised: 11/15/2015 Document Reviewed: 05/13/2015 Elsevier Interactive Patient Education  2017 [employer].

## 2016-10-20 NOTE — Progress Notes (Addendum)
Subjective:    Patient ID: [name] [name], male    DOB: [birth year], 76 y.o.   MRN: [id number]  HPI   [name] [name] is here today for follow up of chronic medical problem.  Outpatient Encounter Prescriptions as of 10/20/2016  Medication Sig  . alfuzosin (UROXATRAL) 10 MG 24 hr tablet Take 10 mg by mouth at bedtime.   Marland Kitchen amLODipine (NORVASC) 5 MG tablet Take 1 tablet (5 mg total) by mouth daily.  Marland Kitchen atorvastatin (LIPITOR) 40 MG tablet Take 1 tablet (40 mg total) by mouth daily.  Marland Kitchen atorvastatin (LIPITOR) 40 MG tablet Take 1 tablet (40 mg total) by mouth daily.  . cholecalciferol (VITAMIN D) 1000 UNITS tablet Take 1 tablet (1,000 Units total) by mouth daily.  Marland Kitchen ibuprofen (ADVIL,MOTRIN) 200 MG tablet Take 400 mg by mouth every 6 (six) hours as needed for mild pain.  Marland Kitchen levothyroxine (SYNTHROID, LEVOTHROID) 75 MCG tablet Take 1 tablet (75 mcg total) by mouth daily.  Marland Kitchen lisinopril (PRINIVIL,ZESTRIL) 40 MG tablet Take 1 tablet (40 mg total) by mouth daily.  Marland Kitchen lisinopril (PRINIVIL,ZESTRIL) 40 MG tablet Take 1 tablet (40 mg total) by mouth daily.  . Multiple Vitamin (ONE-A-DAY MENS PO) Take 1 tablet by mouth daily.    . Omega-3 Fatty Acids (FISH OIL) 1200 MG CAPS Take 2 capsules by mouth daily.   Marland Kitchen UNABLE TO FIND Med Name: bacille calmette guercu 6 weekly treatments for 3-4 months for bladder cancer   No facility-administered encounter medications on file as of 10/20/2016.     1. Nonrheumatic aortic valve stenosis   sees cardiologist every 6 months- no c/o chest pain  2. Bilateral carotid artery stenosis  Denies any dizziness or headaches  3. Essential hypertension   does not check blood pressures at home  4. Hypothyroidism due to acquired atrophy of thyroid  No promblems that he is aware of  5. BMI 26.0-26.9,adult  No recent weight loss or weight gain  6. Mixed hyperlipidemia  Does not watch diet very closely at home  7. Vitamin D deficiency  No problems    New  complaints: Patient had bladder cancer first of year and had to take treatments for that- since then he has been having SOB- AT night he will sit straight up in bed and cannot breathe. Denies cough.    Review of Systems  Constitutional: Negative for diaphoresis.  Eyes: Negative for pain.  Respiratory: Negative for shortness of breath.   Cardiovascular: Negative for chest pain, palpitations and leg swelling.  Gastrointestinal: Negative for abdominal pain.  Endocrine: Negative for polydipsia.  Skin: Negative for rash.  Neurological: Negative for dizziness, weakness and headaches.  Hematological: Does not bruise/bleed easily.       Objective:   Physical Exam  Constitutional: He is oriented to person, place, and time. He appears well-developed and well-nourished.  HENT:  Head: Normocephalic.  Right Ear: External ear normal.  Left Ear: External ear normal.  Nose: Nose normal.  Mouth/Throat: Oropharynx is clear and moist.  Eyes: EOM are normal. Pupils are equal, round, and reactive to light.  Neck: Normal range of motion. Neck supple. No JVD present. No thyromegaly present.  Cardiovascular: Normal rate, regular rhythm and intact distal pulses.  Exam reveals no gallop and no friction rub.   Murmur (3/6 systolic murmur heard loudest at aortic valve.) heard. Pulmonary/Chest: Effort normal and breath sounds normal. No respiratory distress. He has no wheezes. He has no rales. He exhibits no tenderness.  Abdominal: Soft.  Bowel sounds are normal. He exhibits no mass. There is no tenderness.  Genitourinary: Prostate normal and penis normal.  Musculoskeletal: Normal range of motion. He exhibits no edema.  Lymphadenopathy:    He has no cervical adenopathy.  Neurological: He is alert and oriented to person, place, and time. No cranial nerve deficit.  Skin: Skin is warm and dry.  Psychiatric: He has a normal mood and affect. His behavior is normal. Judgment and thought content normal.   BP (!)  119/58   Pulse 65   Temp (!) 96.9 F (36.1 C) (Oral)   Ht '5\' 10"'$  (1.778 m)   Wt 180 lb (81.6 kg)   BMI 25.83 kg/m   Chest xray- pending radiology reading Pre spirometry- FEV1 53% predicted  Post spirometry- FEV1 58% predicted    Assessment & Plan:  1. Nonrheumatic aortic valve stenosis - ECHOCARDIOGRAM COMPLETE; Future  2. Bilateral carotid artery stenosis  3. Essential hypertension Low sodium diet - amLODipine (NORVASC) 5 MG tablet; Take 1 tablet (5 mg total) by mouth daily.  Dispense: 30 tablet; Refill: 5  4. Hypothyroidism due to acquired atrophy of thyroid - levothyroxine (SYNTHROID, LEVOTHROID) 75 MCG tablet; Take 1 tablet (75 mcg total) by mouth daily.  Dispense: 30 tablet; Refill: 8  5. BMI 26.0-26.9,adult Discussed diet and exercise for person with BMI >25 Will recheck weight in 3-6 months  6. Mixed hyperlipidemia Low fat diet - lisinopril (PRINIVIL,ZESTRIL) 40 MG tablet; Take 1 tablet (40 mg total) by mouth daily.  Dispense: 30 tablet; Refill: 5 - atorvastatin (LIPITOR) 40 MG tablet; Take 1 tablet (40 mg total) by mouth daily.  Dispense: 30 tablet; Refill: 5  7. Vitamin D deficiency  8. SOB (shortness of breath) Plan- cho cardiogram first then will do chest CT- will mkae further decsiions after that - DG Chest 2 View; Future - PR EVAL OF BRONCHOSPASM - PR EVAL OF BRONCHOSPASM  9. Chronic obstructive pulmonary disease, unspecified COPD type (HCC) - fluticasone furoate-vilanterol (BREO ELLIPTA) 100-25 MCG/INH AEPB; Inhale 1 puff into the lungs daily.  Dispense: 2 each; Refill: 0    Labs pending Health maintenance reviewed Diet and exercise encouraged Continue all meds Follow up  In 3 months   [place], FNP

## 2016-10-20 NOTE — Addendum Note (Signed)
Addended by: [name] on: 10/20/2016 09:39 AM   Modules accepted: Orders

## 2016-10-21 LAB — CMP14+EGFR
ALBUMIN: 4.3 g/dL (ref 3.5–4.8)
ALT: 16 IU/L (ref 0–44)
AST: 25 IU/L (ref 0–40)
Albumin/Globulin Ratio: 1.5 (ref 1.2–2.2)
Alkaline Phosphatase: 82 IU/L (ref 39–117)
BILIRUBIN TOTAL: 0.5 mg/dL (ref 0.0–1.2)
BUN/Creatinine Ratio: 17 (ref 10–24)
BUN: 18 mg/dL (ref 8–27)
CALCIUM: 9.3 mg/dL (ref 8.6–10.2)
CHLORIDE: 104 mmol/L (ref 96–106)
CO2: 22 mmol/L (ref 18–29)
CREATININE: 1.09 mg/dL (ref 0.76–1.27)
GFR, EST AFRICAN AMERICAN: 76 mL/min/{1.73_m2} (ref >59)
GFR, EST NON AFRICAN AMERICAN: 66 mL/min/{1.73_m2} (ref >59)
GLUCOSE: 108 mg/dL — AB (ref 65–99)
Globulin, Total: 2.8 g/dL (ref 1.5–4.5)
Potassium: 4.6 mmol/L (ref 3.5–5.2)
Sodium: 141 mmol/L (ref 134–144)
TOTAL PROTEIN: 7.1 g/dL (ref 6.0–8.5)

## 2016-10-21 LAB — THYROID PANEL WITH TSH
Free Thyroxine Index: 1.4 (ref 1.2–4.9)
T3 Uptake Ratio: 26 % (ref 24–39)
T4, Total: 5.3 ug/dL (ref 4.5–12.0)
TSH: 4.49 u[IU]/mL (ref 0.450–4.500)

## 2016-10-21 LAB — LIPID PANEL
Chol/HDL Ratio: 3.1 ratio (ref 0.0–5.0)
Cholesterol, Total: 84 mg/dL — ABNORMAL LOW (ref 100–199)
HDL: 27 mg/dL — AB (ref >39)
LDL CALC: 41 mg/dL (ref 0–99)
Triglycerides: 78 mg/dL (ref 0–149)
VLDL CHOLESTEROL CAL: 16 mg/dL (ref 5–40)

## 2016-10-27 ENCOUNTER — Telehealth: Payer: Self-pay

## 2016-10-27 ENCOUNTER — Ambulatory Visit (HOSPITAL_COMMUNITY)
Admission: RE | Admit: 2016-10-27 | Discharge: 2016-10-27 | Disposition: A | Discharge status: Home / Self Care | Payer: Medicare Other | Source: Ambulatory Visit | Attending: Nurse Practitioner | Admitting: Nurse Practitioner

## 2016-10-27 DIAGNOSIS — E785 Hyperlipidemia, unspecified: Secondary | ICD-10-CM | POA: Insufficient documentation

## 2016-10-27 DIAGNOSIS — I350 Nonrheumatic aortic (valve) stenosis: Secondary | ICD-10-CM

## 2016-10-27 DIAGNOSIS — Z9889 Other specified postprocedural states: Secondary | ICD-10-CM | POA: Insufficient documentation

## 2016-10-27 DIAGNOSIS — I10 Essential (primary) hypertension: Secondary | ICD-10-CM | POA: Insufficient documentation

## 2016-10-27 DIAGNOSIS — I6529 Occlusion and stenosis of unspecified carotid artery: Secondary | ICD-10-CM | POA: Insufficient documentation

## 2016-10-27 DIAGNOSIS — I340 Nonrheumatic mitral (valve) insufficiency: Secondary | ICD-10-CM | POA: Diagnosis not present

## 2016-10-27 DIAGNOSIS — I071 Rheumatic tricuspid insufficiency: Secondary | ICD-10-CM | POA: Insufficient documentation

## 2016-10-27 NOTE — Progress Notes (Signed)
*  PRELIMINARY RESULTS* Echocardiogram 2D Echocardiogram has been performed.  [name] [name] 10/27/2016, 9:10 AM

## 2016-10-28 ENCOUNTER — Telehealth: Payer: Self-pay | Admitting: Nurse Practitioner

## 2016-10-28 NOTE — Telephone Encounter (Signed)
Mandy, did you call? Looks like he had cardio visit yesterday.

## 2016-10-28 NOTE — Telephone Encounter (Signed)
Called pt - discussed echo and MMM notes - he has cardio f/u 12/28/16.

## 2016-11-04 ENCOUNTER — Ambulatory Visit (INDEPENDENT_AMBULATORY_CARE_PROVIDER_SITE_OTHER): Payer: Medicare Other | Admitting: Urology

## 2016-11-04 DIAGNOSIS — C678 Malignant neoplasm of overlapping sites of bladder: Secondary | ICD-10-CM | POA: Diagnosis not present

## 2016-11-06 DIAGNOSIS — C678 Malignant neoplasm of overlapping sites of bladder: Secondary | ICD-10-CM | POA: Diagnosis not present

## 2016-11-10 ENCOUNTER — Telehealth: Payer: Self-pay | Admitting: Nurse Practitioner

## 2016-11-10 ENCOUNTER — Ambulatory Visit (HOSPITAL_COMMUNITY)
Admission: RE | Admit: 2016-11-10 | Discharge: 2016-11-10 | Disposition: A | Discharge status: Home / Self Care | Payer: Medicare Other | Source: Ambulatory Visit | Attending: Nurse Practitioner | Admitting: Nurse Practitioner

## 2016-11-10 DIAGNOSIS — J438 Other emphysema: Secondary | ICD-10-CM | POA: Insufficient documentation

## 2016-11-10 DIAGNOSIS — J432 Centrilobular emphysema: Secondary | ICD-10-CM | POA: Diagnosis not present

## 2016-11-10 DIAGNOSIS — I2510 Atherosclerotic heart disease of native coronary artery without angina pectoris: Secondary | ICD-10-CM | POA: Insufficient documentation

## 2016-11-10 DIAGNOSIS — Z87891 Personal history of nicotine dependence: Secondary | ICD-10-CM | POA: Diagnosis not present

## 2016-11-10 DIAGNOSIS — I700 Atherosclerosis of aorta: Secondary | ICD-10-CM | POA: Insufficient documentation

## 2016-11-10 DIAGNOSIS — Z122 Encounter for screening for malignant neoplasm of respiratory organs: Secondary | ICD-10-CM | POA: Insufficient documentation

## 2016-11-10 DIAGNOSIS — I358 Other nonrheumatic aortic valve disorders: Secondary | ICD-10-CM | POA: Insufficient documentation

## 2016-11-11 ENCOUNTER — Other Ambulatory Visit: Payer: Self-pay | Admitting: Nurse Practitioner

## 2016-11-11 ENCOUNTER — Ambulatory Visit: Payer: Medicare Other | Admitting: Nurse Practitioner

## 2016-11-11 DIAGNOSIS — R9389 Abnormal findings on diagnostic imaging of other specified body structures: Secondary | ICD-10-CM

## 2016-11-11 NOTE — Telephone Encounter (Signed)
Patient aware MMM wants him to come in to discuss, appt made today 11/11/16 at 12:30

## 2016-11-21 ENCOUNTER — Encounter: Payer: Self-pay | Admitting: Pulmonary Disease

## 2016-11-21 ENCOUNTER — Ambulatory Visit (INDEPENDENT_AMBULATORY_CARE_PROVIDER_SITE_OTHER): Payer: Medicare Other | Admitting: Pulmonary Disease

## 2016-11-21 VITALS — BP 112/72 | HR 90 | Ht 70.0 in | Wt 180.0 lb

## 2016-11-21 DIAGNOSIS — R911 Solitary pulmonary nodule: Secondary | ICD-10-CM | POA: Diagnosis not present

## 2016-11-21 NOTE — Assessment & Plan Note (Signed)
This nodule appears to be new when compared to 2012 and unfortunately in this heavy smokers very suspicious for primary lung malignancy We'll proceed with PET scan which will help to diagnose as also stage. Basal results of PET scan we would have decide about proceeding with resection versus a biopsy for confirmation.  We'll also schedule PFTs to quantitate lung function to see if he would be a candidate for surgical resection should this turn out to be malignancy  Had extensive discussion with the patient and his wife [name] including reviewing images and possibilities and answered all questions

## 2016-11-21 NOTE — Progress Notes (Signed)
Subjective:    Patient ID: [name] [name], male    DOB: 26-Aug-1940, 76 y.o.   MRN: [id number]  HPI   Chief Complaint  Patient presents with  . Pulm Consult    Referred for abnormal CT of chest. CT scan is in Epic. Had a bladder treatments and noticed the breathing issues afterwards.     76 year old heavy ex-smoker presents for evaluation of abnormal CT screening studies He smoked more than 50 pack years, starting as a teenager, highest about 1.5 packs per day until he quit in 2007. 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 He also has severe aortic stenosis with echo 08/2811 showing a gradient of 68 mm. He has a history of AAA stent repair 2011. 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  spirometry showed FEV1 of 53% with postbronchodilator improvement of 58% He was given Breo which she has used without much change in his breathing. He denies significant dyspnea on exertion. He admits with a cane due to back pain but is able to walk a good distance and his own pace on level ground. He feels that he can climb stairs to although does not have to do it. He denies wheezing or frequent chest colds. There is no history of pedal edema, orthopnea, paroxysmal nocturnal dyspnea or syncope  I was able to review his CT chest from 04/2011 which shows pleural-based lesion in the left posterior sulcus but no similar nodules  Past Medical History:  Diagnosis Date  . AAA (abdominal aortic aneurysm) ([place])   . AAA (abdominal aortic aneurysm) ([place])   . Arthritis   . Atherosclerotic peripheral vascular disease ([place])   . Bilateral carotid artery stenosis   . Cancer [facility]) 2006 and Nov. 9, 2013    [facility], right shoulder  . COPD  (chronic obstructive pulmonary disease) ([place])   . Hyperlipidemia   . Hypertension   . Joint pain   . Skin cancer   . Thyroid disease    hypothyroid  . Vitamin D deficiency   . Weight gain     Past Surgical History:  Procedure Laterality Date  . ABDOMINAL AORTIC ANEURYSM REPAIR  08-09-09  . BACK SURGERY    . HYDROCELE EXCISION / REPAIR    . SKIN CANCER EXCISION    . SPINE SURGERY    . TRANSURETHRAL RESECTION OF BLADDER TUMOR N/A 06/06/2016   Procedure: TRANSURETHRAL RESECTION OF BLADDER TUMOR (TURBT);  Surgeon: Franchot Gallo, MD;  Location: [facility];  Service: Urology;  Laterality: N/A;  . TRANSURETHRAL RESECTION OF BLADDER TUMOR N/A 07/22/2016   Procedure: TRANSURETHRAL RESECTION OF BLADDER TUMOR (TURBT);  Surgeon: Franchot Gallo, MD;  Location: [facility];  Service: Urology;  Laterality: N/A;   No Known Allergies   Social History   Social History  . Marital status: Married    Spouse name: N/A  . Number of children: N/A  . Years of education: N/A   Occupational History  . 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 Smoking status: Former Smoker    Packs/day: 1.00    Years: 51.00    Types: Cigarettes    Start date: 11/25/1954    Quit date: 10/27/2005  .  Smokeless tobacco: Never Used  . Alcohol use No     Comment: none since 1999  . Drug use: No  . Sexual activity: Yes   Other Topics Concern  . Not on file   Social History Narrative  . No narrative on file     Family History  Problem Relation Age of Onset  . Heart disease Father        Heart Disease before age 14  . Alcohol abuse Father   . Heart attack Father   . Cancer Mother        Lung  . COPD Mother   . COPD Sister   . Lupus Sister   . Hypertension Brother   . Gout Brother   . Heart attack Son   . Gout Brother   . Prostatitis Brother       Review of Systems Constitutional: negative for anorexia, fevers and sweats  Eyes: negative for irritation, redness and  visual disturbance  Ears, nose, mouth, throat, and face: negative for earaches, epistaxis, nasal congestion and sore throat  Respiratory: negative for cough, dyspnea on exertion, sputum and wheezing  Cardiovascular: negative for chest pain, dyspnea, lower extremity edema, orthopnea, palpitations and syncope  Gastrointestinal: negative for abdominal pain, constipation, diarrhea, melena, nausea and vomiting  Genitourinary:negative for dysuria, frequency and hematuria  Hematologic/lymphatic: negative for bleeding, easy bruising and lymphadenopathy  Musculoskeletal:negative for arthralgias, muscle weakness and stiff joints  Neurological: negative for coordination problems, gait problems, headaches and weakness  Endocrine: negative for diabetic symptoms including polydipsia, polyuria and weight loss     Objective:   Physical Exam  Gen. Pleasant, well-nourished, in no distress, normal affect ENT - no lesions, no post nasal drip Neck: No JVD, no thyromegaly, no carotid bruits Lungs: no use of accessory muscles, no dullness to percussion, decreased BL without rales or rhonchi  Cardiovascular: Rhythm regular, heart sounds  normal, no murmurs or gallops, no peripheral edema Abdomen: soft and non-tender, no hepatosplenomegaly, BS normal. Musculoskeletal: No deformities, no cyanosis or clubbing Neuro:  alert, non focal       Assessment & Plan:

## 2016-11-21 NOTE — Patient Instructions (Signed)
We reviewed the CT scan images of nodule in the right upper lung and possibilities. Proceed with PET scan Schedule lung function test  Based on these results, we will discuss next step

## 2016-12-02 ENCOUNTER — Ambulatory Visit (HOSPITAL_COMMUNITY)
Admission: RE | Admit: 2016-12-02 | Discharge: 2016-12-02 | Disposition: A | Discharge status: Home / Self Care | Payer: Medicare Other | Source: Ambulatory Visit | Attending: Pulmonary Disease | Admitting: Pulmonary Disease

## 2016-12-02 DIAGNOSIS — R918 Other nonspecific abnormal finding of lung field: Secondary | ICD-10-CM | POA: Diagnosis not present

## 2016-12-02 DIAGNOSIS — R938 Abnormal findings on diagnostic imaging of other specified body structures: Secondary | ICD-10-CM | POA: Diagnosis not present

## 2016-12-02 DIAGNOSIS — R911 Solitary pulmonary nodule: Secondary | ICD-10-CM | POA: Diagnosis not present

## 2016-12-02 LAB — GLUCOSE, CAPILLARY: Glucose-Capillary: 114 mg/dL — ABNORMAL HIGH (ref 65–99)

## 2016-12-02 MED ORDER — FLUDEOXYGLUCOSE F - 18 (FDG) INJECTION
8.9000 | Freq: Once | INTRAVENOUS | Status: AC | PRN
  Administered 2016-12-02: 8.9 via INTRAVENOUS

## 2016-12-03 ENCOUNTER — Telehealth: Payer: Self-pay | Admitting: Pulmonary Disease

## 2016-12-03 NOTE — Telephone Encounter (Signed)
Notes recorded by Rigoberto Noel, MD on 12/03/2016 at 1:18 AM EDT Pl let pt know that nodule did light up on PET scan Pl cancel appt with NP & reschedule with me on 6/19 at 3 pm - ok to double book ------ Pt aware of results/recs.  appts moved to Tuesday- PFT at 2:00 and rov with RA at 3:00.  Nothing further needed.

## 2016-12-08 ENCOUNTER — Ambulatory Visit: Payer: Medicare Other | Admitting: Acute Care

## 2016-12-09 ENCOUNTER — Ambulatory Visit (INDEPENDENT_AMBULATORY_CARE_PROVIDER_SITE_OTHER): Payer: Medicare Other | Admitting: Pulmonary Disease

## 2016-12-09 ENCOUNTER — Encounter: Payer: Self-pay | Admitting: Pulmonary Disease

## 2016-12-09 VITALS — BP 116/60 | HR 75

## 2016-12-09 DIAGNOSIS — R911 Solitary pulmonary nodule: Secondary | ICD-10-CM

## 2016-12-09 DIAGNOSIS — I350 Nonrheumatic aortic (valve) stenosis: Secondary | ICD-10-CM | POA: Diagnosis not present

## 2016-12-09 DIAGNOSIS — J449 Chronic obstructive pulmonary disease, unspecified: Secondary | ICD-10-CM | POA: Insufficient documentation

## 2016-12-09 LAB — PULMONARY FUNCTION TEST
DL/VA % pred: 75 %
DL/VA: 3.5 ml/min/mmHg/L
DLCO COR % PRED: 61 %
DLCO UNC % PRED: 54 %
DLCO UNC: 17.91 ml/min/mmHg
DLCO cor: 20.32 ml/min/mmHg
FEF 25-75 POST: 1.18 L/s
FEF 25-75 PRE: 0.98 L/s
FEF2575-%Change-Post: 20 %
FEF2575-%PRED-POST: 54 %
FEF2575-%Pred-Pre: 45 %
FEV1-%Change-Post: 6 %
FEV1-%PRED-POST: 69 %
FEV1-%Pred-Pre: 64 %
FEV1-POST: 2.11 L
FEV1-PRE: 1.98 L
FEV1FVC-%Change-Post: 2 %
FEV1FVC-%PRED-PRE: 84 %
FEV6-%CHANGE-POST: 3 %
FEV6-%PRED-POST: 83 %
FEV6-%Pred-Pre: 79 %
FEV6-Post: 3.3 L
FEV6-Pre: 3.17 L
FEV6FVC-%CHANGE-POST: 0 %
FEV6FVC-%Pred-Post: 104 %
FEV6FVC-%Pred-Pre: 104 %
FVC-%Change-Post: 4 %
FVC-%Pred-Post: 79 %
FVC-%Pred-Pre: 76 %
FVC-Post: 3.37 L
FVC-Pre: 3.23 L
POST FEV1/FVC RATIO: 63 %
Post FEV6/FVC ratio: 98 %
Pre FEV1/FVC ratio: 61 %
Pre FEV6/FVC Ratio: 98 %
RV % PRED: 124 %
RV: 3.27 L
TLC % PRED: 100 %
TLC: 7.21 L

## 2016-12-09 NOTE — Assessment & Plan Note (Signed)
We discussed that nodule in the right upper lung was positive on PET scan. There is a small 6 mm lymph node that is not well characterized on PET scan.  We discussed that your lung function is adequate for surgery Due to your tight aortic valve, you would be high risk for surgery  Referral to cardiothoracic surgery for opinion. It is possible that due to the high risk nature, he may be better off with radiation (SBRT) to nodule

## 2016-12-09 NOTE — Progress Notes (Signed)
   Subjective:    Patient ID: [name] [name], male    DOB: 16-Oct-1940, 76 y.o.   MRN: [id number]  HPI  76 year old heavy ex-smoker for FU of RUL nodule He smoked more than 50 pack years until he quit in 2007. 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 He also has severe aortic stenosis . He has a history of AAA stent repair 2011. He was diagnosed with bladder cancer 06/2016 ([place]) and underwent transurethral resection of bladder tumors and is undergoing intravesical chemotherapy  We discussed results of PET scan and PFTs today PET showed hypermetabolic right upper lobe nodule and low-grade metabolic syndrome and right paratracheal and prevascular lymph node about 6-8 mm.   Significant tests/ events reviewed  11/10/16 CT chest screening study 5/21 which showed 1.5 cm macrolobulated nodule in the right upper lobe with emphysema in both upper lobes.  spirometry showed FEV1 of 53% with postbronchodilator improvement of 58%  CT chest 04/2011 >> pleural-based lesion in the left posterior sulcus but no similar nodules  Echo 10/2016 severe AS peak gr 89  PFTs 11/2016 FEV1 69% post bronchodilator, 2.1 L, FVC 79%, ratio 61, TLC 100% with DLCO 61%    Review of Systems neg for any significant sore throat, dysphagia, itching, sneezing, nasal congestion or excess/ purulent secretions, fever, chills, sweats, unintended wt loss, pleuritic or exertional cp, hempoptysis, orthopnea pnd or change in chronic leg swelling. Also denies presyncope, palpitations, heartburn, abdominal pain, nausea, vomiting, diarrhea or change in bowel or urinary habits, dysuria,hematuria, rash, arthralgias, visual complaints, headache, numbness weakness or ataxia.     Objective:   Physical Exam   Gen. Pleasant, well-nourished, in no distress ENT - no thrush, no post nasal drip Neck: No JVD, no thyromegaly, no carotid bruits Lungs: no use of accessory muscles, no dullness to percussion, clear without  rales or rhonchi  Cardiovascular: Rhythm regular, heart sounds  normal, ESM 2/6 murmur, no peripheral edema Musculoskeletal: No deformities, no cyanosis or clubbing         Assessment & Plan:

## 2016-12-09 NOTE — Progress Notes (Signed)
PFT done today. 

## 2016-12-09 NOTE — Patient Instructions (Signed)
We discussed that nodule in the right upper lung was positive on PET scan. There is a small 6 mm lymph node that is not well characterized on PET scan.  We discussed that your lung function is adequate for surgery Due to your tight aortic valve, you would be high risk for surgery  Referral to cardiothoracic surgery

## 2016-12-09 NOTE — Assessment & Plan Note (Signed)
Does not need bronchodilators at this time

## 2016-12-09 NOTE — Assessment & Plan Note (Signed)
If he does decide to undergo surgery, may need aortic stenosis to be addressed.

## 2016-12-25 ENCOUNTER — Encounter: Payer: Medicare Other | Admitting: Thoracic Surgery (Cardiothoracic Vascular Surgery)

## 2016-12-30 ENCOUNTER — Ambulatory Visit (INDEPENDENT_AMBULATORY_CARE_PROVIDER_SITE_OTHER): Payer: Medicare Other | Admitting: Cardiovascular Disease

## 2016-12-30 ENCOUNTER — Encounter: Payer: Self-pay | Admitting: Cardiovascular Disease

## 2016-12-30 VITALS — BP 117/69 | HR 85 | Ht 70.0 in | Wt 181.6 lb

## 2016-12-30 DIAGNOSIS — R0609 Other forms of dyspnea: Secondary | ICD-10-CM

## 2016-12-30 DIAGNOSIS — I10 Essential (primary) hypertension: Secondary | ICD-10-CM | POA: Diagnosis not present

## 2016-12-30 DIAGNOSIS — I779 Disorder of arteries and arterioles, unspecified: Secondary | ICD-10-CM

## 2016-12-30 DIAGNOSIS — I350 Nonrheumatic aortic (valve) stenosis: Secondary | ICD-10-CM

## 2016-12-30 DIAGNOSIS — I739 Peripheral vascular disease, unspecified: Secondary | ICD-10-CM

## 2016-12-30 DIAGNOSIS — Z95828 Presence of other vascular implants and grafts: Secondary | ICD-10-CM | POA: Diagnosis not present

## 2016-12-30 DIAGNOSIS — R911 Solitary pulmonary nodule: Secondary | ICD-10-CM

## 2016-12-30 DIAGNOSIS — E7800 Pure hypercholesterolemia, unspecified: Secondary | ICD-10-CM | POA: Diagnosis not present

## 2016-12-30 NOTE — Patient Instructions (Signed)
Medication Instructions:  Your physician recommends that you continue on your current medications as directed. Please refer to the Current Medication list given to you today.  Labwork: NONE  Testing/Procedures: NONE  Follow-Up: Your physician recommends that you schedule a follow-up appointment in: 3 MONTHS WITH DR. Bronson Ing  Any Other Special Instructions Will Be Listed Below (If Applicable). You have been referred to [facility]   If you need a refill on your cardiac medications before your next appointment, please call your pharmacy.

## 2016-12-30 NOTE — Progress Notes (Signed)
SUBJECTIVE: The patient presents for follow-up of severe aortic stenosis. He also has a history of hypertension, hyperlipidemia, nonobstructive carotid artery disease, COPD, AAA s/p endovascular repair, and hypothyroidism.  Echocardiogram 07/02/16 showed normal left ventricular systolic function, EF 30-16%, grade 1 diastolic dysfunction, severe aortic stenosis, mean gradient 54 mmHg (previously 68 mmHg), and mild aortic regurgitation.  He has a history of high-grade, non-muscle invasive bladder cancer and underwent surgery on 06/06/16.   He now follows with pulmonary as he was found to have a right upper lobe nodule which was hypermetabolic on PET scan. It was speculated and appeared to be consistent with neoplasm. There were also up a normal-sized mediastinal lymph nodes which were of uncertain significance. This was on 12/02/16.  He has been scheduled to see CT surgery.  It appears he has become more short of breath when walking about 200 feet to his garden. He now has to stop due to shortness of breath. He denies chest pain. He has had some bilateral ankle swelling if he has been up on his feet for long periods of time.   Review of Systems: As per "subjective", otherwise negative.  No Known Allergies  Current Outpatient Prescriptions  Medication Sig Dispense Refill  . alfuzosin (UROXATRAL) 10 MG 24 hr tablet Take 10 mg by mouth at bedtime.     Marland Kitchen amLODipine (NORVASC) 5 MG tablet Take 1 tablet (5 mg total) by mouth daily. 30 tablet 5  . atorvastatin (LIPITOR) 40 MG tablet Take 1 tablet (40 mg total) by mouth daily. 30 tablet 5  . cholecalciferol (VITAMIN D) 1000 UNITS tablet Take 1 tablet (1,000 Units total) by mouth daily. 30 tablet 6  . levothyroxine (SYNTHROID, LEVOTHROID) 75 MCG tablet Take 1 tablet (75 mcg total) by mouth daily. 30 tablet 8  . lisinopril (PRINIVIL,ZESTRIL) 40 MG tablet Take 1 tablet (40 mg total) by mouth daily. 30 tablet 5  . Multiple Vitamin (ONE-A-DAY MENS  PO) Take 1 tablet by mouth daily.      . Omega-3 Fatty Acids (FISH OIL) 1200 MG CAPS Take 2 capsules by mouth daily.      No current facility-administered medications for this visit.     Past Medical History:  Diagnosis Date  . AAA (abdominal aortic aneurysm) ([place])   . AAA (abdominal aortic aneurysm) ([place])   . Arthritis   . Atherosclerotic peripheral vascular disease ([place])   . Bilateral carotid artery stenosis   . Cancer [facility]) 2006 and Nov. 9, 2013    [facility], right shoulder  . COPD (chronic obstructive pulmonary disease) ([place])   . Hyperlipidemia   . Hypertension   . Joint pain   . Skin cancer   . Thyroid disease    hypothyroid  . Vitamin D deficiency   . Weight gain     Past Surgical History:  Procedure Laterality Date  . ABDOMINAL AORTIC ANEURYSM REPAIR  08-09-09  . BACK SURGERY    . HYDROCELE EXCISION / REPAIR    . SKIN CANCER EXCISION    . SPINE SURGERY    . TRANSURETHRAL RESECTION OF BLADDER TUMOR N/A 06/06/2016   Procedure: TRANSURETHRAL RESECTION OF BLADDER TUMOR (TURBT);  Surgeon: Franchot Gallo, MD;  Location: [facility];  Service: Urology;  Laterality: N/A;  . TRANSURETHRAL RESECTION OF BLADDER TUMOR N/A 07/22/2016   Procedure: TRANSURETHRAL RESECTION OF BLADDER TUMOR (TURBT);  Surgeon: Franchot Gallo, MD;  Location: [facility];  Service: Urology;  Laterality: N/A;    Social History  Social History  . Marital status: Married    Spouse name: N/A  . Number of children: N/A  . Years of education: N/A   Occupational History  . 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 Smoking status: Former Smoker    Packs/day: 1.00    Years: 51.00    Types: Cigarettes    Start date: 11/25/1954    Quit date: 10/27/2005  . Smokeless tobacco: Never Used  . Alcohol use No     Comment: none since 1999  . Drug use: No  . Sexual activity: Yes   Other Topics Concern  . Not on file   Social History Narrative  . No narrative on file      Vitals:   12/30/16 0835  BP: 117/69  Pulse: 85  Weight: 181 lb 9.6 oz (82.4 kg)  Height: 5\' 10"  (1.778 m)    Wt Readings from Last 3 Encounters:  12/30/16 181 lb 9.6 oz (82.4 kg)  11/21/16 180 lb (81.6 kg)  10/20/16 180 lb (81.6 kg)     PHYSICAL EXAM General: NAD HEENT: Normal. Neck: No JVD, no thyromegaly. Lungs: Clear to auscultation bilaterally with normal respiratory effort. CV: Regular rate and rhythm, normal S1/diminished S2, no S3/S4, late-peaking IV/VI ejection systolic murmur. No peripheral edema. Abdomen: Soft, nontender, no distention.  Neurologic: Alert and oriented.  Psych: Normal affect. Skin: Normal. Musculoskeletal: No gross deformities.    ECG: Most recent ECG reviewed.   Labs: Lab Results  Component Value Date/Time   K 4.6 10/20/2016 10:22 AM   BUN 18 10/20/2016 10:22 AM   CREATININE 1.09 10/20/2016 10:22 AM   CREATININE 1.02 01/14/2013 11:57 AM   ALT 16 10/20/2016 10:22 AM   TSH 4.490 10/20/2016 10:22 AM   HGB 10.2 (L) 07/17/2016 01:38 PM   HGB 14.1 02/01/2016 04:30 PM     Lipids: Lab Results  Component Value Date/Time   LDLCALC 41 10/20/2016 10:22 AM   LDLCALC 61 04/13/2014 08:46 AM   LDLCALC 52 01/14/2013 11:57 AM   CHOL 84 (L) 10/20/2016 10:22 AM   CHOL 118 01/14/2013 11:57 AM   TRIG 78 10/20/2016 10:22 AM   TRIG 160 (H) 04/13/2014 08:46 AM   TRIG 178 (H) 01/14/2013 11:57 AM   HDL 27 (L) 10/20/2016 10:22 AM   HDL 32 (L) 04/13/2014 08:46 AM   HDL 30 (L) 01/14/2013 11:57 AM       ASSESSMENT AND PLAN:  1. Severe symptomatic aortic stenosis:  He is now symptomatic. He will need to have his aortic valve replaced and the most optimal method will have to be determined. This is complicated by the fact that he now has a hypermetabolic right upper lobe nodule suspicious for neoplasm which may require surgical excision vs radiation treatment.  I will make a referral to the valve clinic for further management. Most recent echo  reviewed above.   2. Essential HTN: Controlled on amlodipine 5 mg and lisinopril 40 mg in context of severe AS. No changes.  3. Hyperlipidemia: On high-intensity statin therapy with Lipitor 40 mg.  4. PVD: Followed by vascular surgery. ABIs were normal on 07/23/15.   Disposition: Follow up 3 months.  Time spent: 40 minutes, of which greater than 50% was spent reviewing symptoms, relevant blood tests and studies, and discussing management plan with the patient.    Kate Sable, M.D., F.A.C.C.

## 2016-12-31 ENCOUNTER — Encounter: Payer: Self-pay | Admitting: Thoracic Surgery (Cardiothoracic Vascular Surgery)

## 2016-12-31 ENCOUNTER — Institutional Professional Consult (permissible substitution) (INDEPENDENT_AMBULATORY_CARE_PROVIDER_SITE_OTHER): Payer: Medicare Other | Admitting: Thoracic Surgery (Cardiothoracic Vascular Surgery)

## 2016-12-31 ENCOUNTER — Other Ambulatory Visit: Payer: Self-pay | Admitting: *Deleted

## 2016-12-31 VITALS — BP 131/67 | HR 80 | Resp 18 | Ht 70.0 in | Wt 181.0 lb

## 2016-12-31 DIAGNOSIS — I350 Nonrheumatic aortic (valve) stenosis: Secondary | ICD-10-CM

## 2016-12-31 DIAGNOSIS — R911 Solitary pulmonary nodule: Secondary | ICD-10-CM | POA: Diagnosis not present

## 2016-12-31 DIAGNOSIS — R918 Other nonspecific abnormal finding of lung field: Secondary | ICD-10-CM

## 2016-12-31 DIAGNOSIS — C7931 Secondary malignant neoplasm of brain: Secondary | ICD-10-CM

## 2016-12-31 NOTE — Progress Notes (Signed)
[address]       , [id number]             [phone]    HPI: [name] is sent for consultation re: a RUL lung nodule  [name] is a 76 year old gentleman with a history of remote tobacco abuse (50 pack years, quit 11 years ago), COPD, hypertension, hyperlipidemia, severe aortic stenosis, stent graft repair of an abdominal aortic aneurysm in 2011 and bladder cancer. He saw his primary in April regarding shortness of breath. This would occur both with exertion and also when lying flat in bed. Chest x-ray showed a right upper lobe lung nodule. A CT of the chest confirmed a 17 mm spiculated nodule in the right upper lobe. There was no significant hilar or mediastinal adenopathy. A PET CT showed the nodule was hypermetabolic with an SUV of 6.7. There was no pathologic adenopathy although there were a couple borderline mediastinal nodes.  He has known severe aortic stenosis that has been followed with echocardiogram. He says that was first diagnosed about 2 years ago. He had a repeat echocardiogram in early May which showed a calcified aortic valve with a mean gradient of 55 and peak gradient of 89 mmHg. Calculated valve area was 0.63 cm. Systolic function was normal with an EF of 60%, there was left ventricular hypertrophy.  He gets short of breath with exertion and experiences a sensation of tightness in his chest working around the house or in the yard. He says he can walk about 150 yards before he has to stop. The tightness and shortness of breath are relieved with rest. He also has episodes where he gets short of breath lying flat on his back and has to sit upright in the bed. He has not had any rest chest tightness or pressure. 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 He also is noted some swelling in his legs recently. He thinks that started back in December around that time he had his first bladder procedure. His appetite is good. He has not had any significant  weight loss. He says he was told recently he had "a little bit" of COPD. He denies cough, hemoptysis, and wheezing. He also denies any unusual headaches or visual changes.  Zubrod Score: At the time of surgery this patient's most appropriate activity status/level should be described as: []     0    Normal activity, no symptoms [x]     1    Restricted in physical strenuous activity but ambulatory, able to do out light work []     2    Ambulatory and capable of self care, unable to do work activities, up and about >50 % of waking hours                              []     3    Only limited self care, in bed greater than 50% of waking hours []     4    Completely disabled, no self care, confined to bed or chair []     5    Moribund  Past Medical History:  Diagnosis Date  . AAA (abdominal aortic aneurysm) ([place])   . AAA (abdominal aortic aneurysm) ([place])   . Arthritis   . Atherosclerotic peripheral vascular disease ([place])   . Bilateral carotid artery stenosis   . Cancer [facility]) 2006 and Nov. 9, 2013  BCC, right shoulder  . COPD (chronic obstructive pulmonary disease) ([place])   . Hyperlipidemia   . Hypertension   . Joint pain   . Skin cancer   . Thyroid disease    hypothyroid  . Vitamin D deficiency   . Weight gain    Past Surgical History:  Procedure Laterality Date  . ABDOMINAL AORTIC ANEURYSM REPAIR  08-09-09  . BACK SURGERY    . HYDROCELE EXCISION / REPAIR    . SKIN CANCER EXCISION    . SPINE SURGERY    . TRANSURETHRAL RESECTION OF BLADDER TUMOR N/A 06/06/2016   Procedure: TRANSURETHRAL RESECTION OF BLADDER TUMOR (TURBT);  Surgeon: Franchot Gallo, MD;  Location: [facility];  Service: Urology;  Laterality: N/A;  . TRANSURETHRAL RESECTION OF BLADDER TUMOR N/A 07/22/2016   Procedure: TRANSURETHRAL RESECTION OF BLADDER TUMOR (TURBT);  Surgeon: Franchot Gallo, MD;  Location: [facility];  Service: Urology;  Laterality: N/A;   Current Outpatient Prescriptions  Medication Sig Dispense Refill  .  alfuzosin (UROXATRAL) 10 MG 24 hr tablet Take 10 mg by mouth at bedtime.     Marland Kitchen amLODipine (NORVASC) 5 MG tablet Take 1 tablet (5 mg total) by mouth daily. 30 tablet 5  . atorvastatin (LIPITOR) 40 MG tablet Take 1 tablet (40 mg total) by mouth daily. 30 tablet 5  . cholecalciferol (VITAMIN D) 1000 UNITS tablet Take 1 tablet (1,000 Units total) by mouth daily. 30 tablet 6  . levothyroxine (SYNTHROID, LEVOTHROID) 75 MCG tablet Take 1 tablet (75 mcg total) by mouth daily. 30 tablet 8  . lisinopril (PRINIVIL,ZESTRIL) 40 MG tablet Take 1 tablet (40 mg total) by mouth daily. 30 tablet 5  . Multiple Vitamin (ONE-A-DAY MENS PO) Take 1 tablet by mouth daily.      . Omega-3 Fatty Acids (FISH OIL) 1200 MG CAPS Take 2 capsules by mouth daily.      No current facility-administered medications for this visit.    Family History  Problem Relation Age of Onset  . Heart disease Father        Heart Disease before age 77  . Alcohol abuse Father   . Heart attack Father   . Cancer Mother        Lung  . COPD Mother   . COPD Sister   . Lupus Sister   . Hypertension Brother   . Gout Brother   . Heart attack Son   . Gout Brother   . Prostatitis Brother    Social History   Social History  . Marital status: Married    Spouse name: N/A  . Number of children: N/A  . Years of education: N/A   Occupational History  . 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 Smoking status: Former Smoker    Packs/day: 1.00    Years: 51.00    Types: Cigarettes    Start date: 11/25/1954    Quit date: 10/27/2005  . Smokeless tobacco: Never Used  . Alcohol use No     Comment: none since 1999  . Drug use: No  . Sexual activity: Yes   Other Topics Concern  . Not on file   Social History Narrative  . No narrative on file    Review of systems Chest tightness and shortness of breath with exertion. Orthopnea. Peripheral edema. Pain and feet when lying flat, leg cramps, pain in legs  with walking, joint pain and swelling. Under going treatment for bladder cancer  with BCG. No hematuria or dysuria. Headaches or visual changes Denies heartburn, abdominal pain, blood in stool. All other systems are negative  Physical Exam BP 131/67   Pulse 80   Resp 18   Ht 5\' 10"  (1.778 m)   Wt 181 lb (82.1 kg)   SpO2 97% Comment: on RA  BMI 25.58 kg/m  76 year old man in no acute distress Well-developed well-nourished Alert and oriented 3, cranial nerves II through XII intact, no motor deficit Neck supple no palpable adenopathy or thyromegaly, positive transmitted murmur versus bruit bilaterally Cardiac regular rate and rhythm with a 3/6 crescendo decrescendo systolic murmur heard throughout precordium Lungs clear bilaterally no rales or wheezing Abdomen soft and nontender Extremities no clubbing or cyanosis. 1+ peripheral edema both lower extremities Skin warm and dry  Diagnostic Tests: CT CHEST WITHOUT CONTRAST LOW-DOSE FOR LUNG CANCER SCREENING  TECHNIQUE: Multidetector CT imaging of the chest was performed following the standard protocol without IV contrast.  COMPARISON:  Chest CT 04/28/2011.  FINDINGS: Cardiovascular: Heart size is normal. There is no significant pericardial fluid, thickening or pericardial calcification. There is aortic atherosclerosis, as well as atherosclerosis of the great vessels of the mediastinum and the coronary arteries, including calcified atherosclerotic plaque in the left main, left anterior descending, left circumflex and right coronary arteries. Severe calcifications of the aortic valve.  Mediastinum/Nodes: No pathologically enlarged mediastinal or hilar lymph nodes. Please note that accurate exclusion of hilar adenopathy is limited on noncontrast CT scans. Esophagus is unremarkable in appearance. No axillary lymphadenopathy.  Lungs/Pleura: Large aggressive appearing nodule with highly irregular macrolobulated and slightly  spiculated margins in the anterior aspect of the right upper lobe (axial image 113 of series 3) with a volume derived mean diameter of 15.6 mm, highly suspicious for primary bronchogenic neoplasm. Additional sub solid nodule in the right upper lobe (axial image 137 of series 3) with a volume derived mean diameter of 10.7 mm. No acute consolidative airspace disease. No pleural effusions. Mild diffuse bronchial wall thickening with moderate centrilobular and paraseptal emphysema. Scattered areas of scarring are also noted throughout the periphery of the lungs, most evident in the posterior aspect of the left lower lobe.  Upper Abdomen: Aortic atherosclerosis.  Musculoskeletal: There are no aggressive appearing lytic or blastic lesions noted in the visualized portions of the skeleton.  IMPRESSION: 1. Lung-RADS 4BS, suspicious. Additional imaging evaluation or consultation with Pulmonology or Thoracic Surgery recommended. 2. The "S" modifier above refers to potentially clinically significant non lung cancer related findings. Specifically, there is aortic atherosclerosis, in addition to left main and 3 vessel coronary artery disease. Assessment for potential risk factor modification, dietary therapy or pharmacologic therapy may be warranted, if clinically indicated. 3. There are severe calcifications of the aortic valve. Echocardiographic correlation for evaluation of potential valvular dysfunction may be warranted if clinically indicated. 4. Mild diffuse bronchial wall thickening with moderate centrilobular and paraseptal emphysema; imaging findings suggestive of underlying COPD. These results will be called to the ordering clinician or representative by the Radiologist Assistant, and communication documented in the PACS or zVision Dashboard.   Electronically Signed   By: Vinnie Langton M.D.   On: 11/10/2016 09:13 NUCLEAR MEDICINE PET SKULL BASE TO THIGH  TECHNIQUE: 8.9  mCi F-18 FDG was injected intravenously. Full-ring PET imaging was performed from the skull base to thigh after the radiotracer. CT data was obtained and used for attenuation correction and anatomic localization.  FASTING BLOOD GLUCOSE:  Value: 114 mg/dl  COMPARISON:  Lung cancer  screening CT 11/10/2016  FINDINGS: NECK  No hypermetabolic lymph nodes in the neck.  CHEST  The anterior right upper lobe spiculated pulmonary nodule is markedly hypermetabolic with SUV max = 6.7. No definite hypermetabolic metastatic lymphadenopathy in the mediastinum or right hilum although 2 upper normal lymph nodes, an 8 mm short axis prevascular node seen on image 67 of series 4 and a 7 mm low right paratracheal lymph nodes seen on the same image showed low level FDG uptake at about background blood pool levels (SUV max 2-3).  ABDOMEN/PELVIS  No abnormal hypermetabolic activity within the liver, pancreas, adrenal glands, or spleen. No hypermetabolic lymph nodes in the abdomen or pelvis.  Focus of hypermetabolism identified in the right aspect of the prostate gland demonstrates SUV max = 7.7.  Tiny gallstones are evident. The patient is status post aortic endograft placement. Left colonic diverticulosis without diverticulitis is evident.  SKELETON  No focal hypermetabolic activity to suggest skeletal metastasis.  IMPRESSION: 1. Anterior right upper lobe spiculated pulmonary nodule is markedly hypermetabolic, consistent with neoplasm. 2. Upper normal size mediastinal lymph nodes show low level FDG accumulation. While these findings are not definite for metastatic disease, metastatic involvement cannot be excluded. No findings to suggest hypermetabolic metastatic disease in the neck, abdomen or pelvis. 3. Focal area of hypermetabolism identified in the right prostate gland. This does raise concern for prostate neoplasm.   Electronically Signed   By: Misty Stanley M.D.    On: 12/02/2016 10:20  ECHOCARDIOGRAM 10/27/2016 Study Conclusions  - Left ventricle: The cavity size was normal. Wall thickness was   increased in a pattern of mild LVH. Systolic function was normal.   The estimated ejection fraction was in the range of 60% to 65%. - Aortic valve: AV is thickened, calcified with restricted motion   Peak and mean gradients through the valve are 89 and 55 mm Hg   respectively consistent with severe AS. No significant change   from echo of Jan 2018 There was mild regurgitation. - Mitral valve: Calcified annulus. Mildly thickened leaflets .   There was mild regurgitation. - Left atrium: The atrium was severely dilated. - Right atrium: The atrium was mildly dilated. - Pulmonary arteries: PA peak pressure: 33 mm Hg (S).  Pulmonary function testing FVC 3.23 (76%) FEV1 1.98 (64%), 2.11 (69%) postbronchodilator FEV1/FVC 0.61 DLCO 20.32 (61%)  I personally reviewed the CT chest, PET/CT, and echocardiogram and concur with the findings noted above.  Impression: 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 He recently was found to have a 1.7 cm spiculated nodule on CT that is hypermetabolic by PET.  Lung nodule- this almost certainly is a new primary bronchogenic carcinoma and has to be considered that was can be proven otherwise. Clinical appearance is consistent with a stage IA non-small cell carcinoma that is potentially curable with surgical resection or possibly with stereotactic radiation. From a pulmonary standpoint he is a candidate for surgery, but at present his cardiovascular risk is prohibitive although potentially treatable.  Severe aortic stenosis- mean gradient of  55 and peak 89 mmHg. Calculated valve area 0.62 cm. He would not be able to tolerate a lobectomy without treating his aortic stenosis first. He has symptomatic with chest tightness and shortness of breath with exertion and also has  some early chronic diastolic heart failure symptoms- orthopnea and peripheral edema. His aortic valve definitely needs to be addressed. The question is how best to do so. Both surgical valve replacement and TAVR are potential options. Surgical valve replacement would come at significant risk of stroke due to his heavily calcified ascending aorta. He might be a candidate for a transapical TAVR, but that will depend on his coronary anatomy.  Coronary calcification on CT- he does have anginal symptoms which may be strictly attributable to his aortic stenosis. However, there is a high likelihood he has coronary artery disease. With that would be amenable to percutaneous interventional or would require surgical intervention remains to be determined. I think the first of his evaluation will be to do right and left heart catheterization to assess his coronaries is that will have an impact on what the options are regarding his aortic stenosis.  Ascending aortic atherosclerosis- while not quite a classical porcelain aorta he does have extensive calcified atherosclerotic plaque in the ascending aorta. This would make him relatively high risk for stroke with any cardiac intervention.  Plan: Right and left heart cath to assess coronary anatomy to help decide between surgical AVR versus TAVR.  MR brain-  New lung mass rule out metastases Return to office after those tests to discuss how to proceed.  Melrose Nakayama, MD [facility] [phone]

## 2017-01-06 ENCOUNTER — Ambulatory Visit (INDEPENDENT_AMBULATORY_CARE_PROVIDER_SITE_OTHER): Payer: Medicare Other | Admitting: Urology

## 2017-01-06 DIAGNOSIS — C678 Malignant neoplasm of overlapping sites of bladder: Secondary | ICD-10-CM | POA: Diagnosis not present

## 2017-01-12 ENCOUNTER — Telehealth: Payer: Self-pay

## 2017-01-12 NOTE — Telephone Encounter (Signed)
Patient contacted pre-catheterization at [facility] scheduled for: 01/14/2017 @ 28 Verified arrival time and place:  NT @ 0900-gave directions to admitting Confirmed AM meds to be taken pre-cath with sip of water:  Pt states he has ASA 81 mg-notified Pt to take prior to arrival Confirmed patient has responsible person to drive home post procedure and observe patient for 24 hours:  wife Addl concerns:  None noted

## 2017-01-13 ENCOUNTER — Ambulatory Visit (INDEPENDENT_AMBULATORY_CARE_PROVIDER_SITE_OTHER): Payer: Medicare Other | Admitting: Urology

## 2017-01-13 ENCOUNTER — Ambulatory Visit
Admission: RE | Admit: 2017-01-13 | Discharge: 2017-01-13 | Disposition: A | Discharge status: Home / Self Care | Payer: Medicare Other | Source: Ambulatory Visit | Attending: Thoracic Surgery (Cardiothoracic Vascular Surgery) | Admitting: Thoracic Surgery (Cardiothoracic Vascular Surgery)

## 2017-01-13 DIAGNOSIS — C7931 Secondary malignant neoplasm of brain: Secondary | ICD-10-CM

## 2017-01-13 DIAGNOSIS — C678 Malignant neoplasm of overlapping sites of bladder: Secondary | ICD-10-CM | POA: Diagnosis not present

## 2017-01-13 DIAGNOSIS — R918 Other nonspecific abnormal finding of lung field: Secondary | ICD-10-CM

## 2017-01-13 DIAGNOSIS — G9389 Other specified disorders of brain: Secondary | ICD-10-CM | POA: Diagnosis not present

## 2017-01-13 MED ORDER — GADOBENATE DIMEGLUMINE 529 MG/ML IV SOLN
17.0000 mL | Freq: Once | INTRAVENOUS | Status: AC | PRN
  Administered 2017-01-13: 17 mL via INTRAVENOUS

## 2017-01-14 ENCOUNTER — Ambulatory Visit (HOSPITAL_COMMUNITY)
Admission: RE | Disposition: A | Discharge status: Home / Self Care | Payer: Self-pay | Source: Ambulatory Visit | Attending: Cardiovascular Disease

## 2017-01-14 ENCOUNTER — Ambulatory Visit (HOSPITAL_COMMUNITY)
Admission: RE | Admit: 2017-01-14 | Discharge: 2017-01-14 | Disposition: A | Discharge status: Home / Self Care | Payer: Medicare Other | Source: Ambulatory Visit | Attending: Cardiovascular Disease | Admitting: Cardiovascular Disease

## 2017-01-14 DIAGNOSIS — J449 Chronic obstructive pulmonary disease, unspecified: Secondary | ICD-10-CM | POA: Insufficient documentation

## 2017-01-14 DIAGNOSIS — E559 Vitamin D deficiency, unspecified: Secondary | ICD-10-CM | POA: Diagnosis not present

## 2017-01-14 DIAGNOSIS — E039 Hypothyroidism, unspecified: Secondary | ICD-10-CM | POA: Insufficient documentation

## 2017-01-14 DIAGNOSIS — I350 Nonrheumatic aortic (valve) stenosis: Secondary | ICD-10-CM | POA: Insufficient documentation

## 2017-01-14 DIAGNOSIS — I6523 Occlusion and stenosis of bilateral carotid arteries: Secondary | ICD-10-CM | POA: Insufficient documentation

## 2017-01-14 DIAGNOSIS — Z85828 Personal history of other malignant neoplasm of skin: Secondary | ICD-10-CM | POA: Diagnosis not present

## 2017-01-14 DIAGNOSIS — I2584 Coronary atherosclerosis due to calcified coronary lesion: Secondary | ICD-10-CM | POA: Diagnosis not present

## 2017-01-14 DIAGNOSIS — I700 Atherosclerosis of aorta: Secondary | ICD-10-CM | POA: Insufficient documentation

## 2017-01-14 DIAGNOSIS — I272 Pulmonary hypertension, unspecified: Secondary | ICD-10-CM | POA: Diagnosis not present

## 2017-01-14 DIAGNOSIS — I70209 Unspecified atherosclerosis of native arteries of extremities, unspecified extremity: Secondary | ICD-10-CM | POA: Insufficient documentation

## 2017-01-14 DIAGNOSIS — E785 Hyperlipidemia, unspecified: Secondary | ICD-10-CM | POA: Diagnosis not present

## 2017-01-14 DIAGNOSIS — I2510 Atherosclerotic heart disease of native coronary artery without angina pectoris: Secondary | ICD-10-CM | POA: Diagnosis not present

## 2017-01-14 DIAGNOSIS — Z8551 Personal history of malignant neoplasm of bladder: Secondary | ICD-10-CM | POA: Diagnosis not present

## 2017-01-14 DIAGNOSIS — I10 Essential (primary) hypertension: Secondary | ICD-10-CM | POA: Insufficient documentation

## 2017-01-14 DIAGNOSIS — Z87891 Personal history of nicotine dependence: Secondary | ICD-10-CM | POA: Diagnosis not present

## 2017-01-14 LAB — CBC
HEMATOCRIT: 31.4 % — AB (ref 39.0–52.0)
Hemoglobin: 9.3 g/dL — ABNORMAL LOW (ref 13.0–17.0)
MCH: 23.1 pg — ABNORMAL LOW (ref 26.0–34.0)
MCHC: 29.6 g/dL — ABNORMAL LOW (ref 30.0–36.0)
MCV: 77.9 fL — AB (ref 78.0–100.0)
PLATELETS: 229 10*3/uL (ref 150–400)
RBC: 4.03 MIL/uL — AB (ref 4.22–5.81)
RDW: 19.4 % — ABNORMAL HIGH (ref 11.5–15.5)
WBC: 6.7 10*3/uL (ref 4.0–10.5)

## 2017-01-14 LAB — BASIC METABOLIC PANEL
ANION GAP: 10 (ref 5–15)
BUN: 17 mg/dL (ref 6–20)
CO2: 23 mmol/L (ref 22–32)
Calcium: 9.3 mg/dL (ref 8.9–10.3)
Chloride: 108 mmol/L (ref 101–111)
Creatinine, Ser: 1.2 mg/dL (ref 0.61–1.24)
GFR calc non Af Amer: 57 mL/min — ABNORMAL LOW (ref >60)
GLUCOSE: 104 mg/dL — AB (ref 65–99)
POTASSIUM: 4.1 mmol/L (ref 3.5–5.1)
Sodium: 141 mmol/L (ref 135–145)

## 2017-01-14 LAB — POCT I-STAT 3, VENOUS BLOOD GAS (G3P V)
ACID-BASE DEFICIT: 2 mmol/L (ref 0.0–2.0)
BICARBONATE: 22.5 mmol/L (ref 20.0–28.0)
O2 Saturation: 63 %
PH VEN: 7.389 (ref 7.250–7.430)
TCO2: 24 mmol/L (ref 0–100)
pCO2, Ven: 37.3 mmHg — ABNORMAL LOW (ref 44.0–60.0)
pO2, Ven: 33 mmHg (ref 32.0–45.0)

## 2017-01-14 LAB — POCT I-STAT 3, ART BLOOD GAS (G3+)
Acid-base deficit: 2 mmol/L (ref 0.0–2.0)
Bicarbonate: 21.9 mmol/L (ref 20.0–28.0)
O2 Saturation: 97 %
PCO2 ART: 35.1 mmHg (ref 32.0–48.0)
PH ART: 7.403 (ref 7.350–7.450)
PO2 ART: 92 mmHg (ref 83.0–108.0)
TCO2: 23 mmol/L (ref 0–100)

## 2017-01-14 LAB — PROTIME-INR
INR: 1.2
Prothrombin Time: 15.3 seconds — ABNORMAL HIGH (ref 11.4–15.2)

## 2017-01-14 SURGERY — RIGHT HEART CATH AND CORONARY ANGIOGRAPHY
Anesthesia: LOCAL

## 2017-01-14 MED ORDER — FENTANYL CITRATE (PF) 100 MCG/2ML IJ SOLN
INTRAMUSCULAR | Status: AC
  Filled 2017-01-14: qty 2

## 2017-01-14 MED ORDER — HEPARIN SODIUM (PORCINE) 1000 UNIT/ML IJ SOLN
INTRAMUSCULAR | Status: AC
  Filled 2017-01-14: qty 1

## 2017-01-14 MED ORDER — ASPIRIN 81 MG PO CHEW: 81.0000 mg | CHEWABLE_TABLET | ORAL | Status: DC

## 2017-01-14 MED ORDER — ACETAMINOPHEN 325 MG PO TABS: 650.0000 mg | ORAL_TABLET | ORAL | Status: DC | PRN

## 2017-01-14 MED ORDER — IOPAMIDOL (ISOVUE-370) INJECTION 76%
INTRAVENOUS | Status: AC
  Filled 2017-01-14: qty 100

## 2017-01-14 MED ORDER — IOPAMIDOL (ISOVUE-370) INJECTION 76%
INTRAVENOUS | Status: DC | PRN
  Administered 2017-01-14: 90 mL via INTRA_ARTERIAL

## 2017-01-14 MED ORDER — SODIUM CHLORIDE 0.9% FLUSH: 3.0000 mL | Freq: Two times a day (BID) | INTRAVENOUS | Status: DC

## 2017-01-14 MED ORDER — HEPARIN SODIUM (PORCINE) 1000 UNIT/ML IJ SOLN
INTRAMUSCULAR | Status: DC | PRN
  Administered 2017-01-14: 5000 [IU] via INTRAVENOUS

## 2017-01-14 MED ORDER — HEPARIN (PORCINE) IN NACL 2-0.9 UNIT/ML-% IJ SOLN
INTRAMUSCULAR | Status: AC
  Filled 2017-01-14: qty 1000

## 2017-01-14 MED ORDER — SODIUM CHLORIDE 0.9 % WEIGHT BASED INFUSION: 1.0000 mL/kg/h | INTRAVENOUS | Status: DC

## 2017-01-14 MED ORDER — MIDAZOLAM HCL 2 MG/2ML IJ SOLN
INTRAMUSCULAR | Status: DC | PRN
  Administered 2017-01-14: 1 mg via INTRAVENOUS

## 2017-01-14 MED ORDER — MIDAZOLAM HCL 2 MG/2ML IJ SOLN
INTRAMUSCULAR | Status: AC
  Filled 2017-01-14: qty 2

## 2017-01-14 MED ORDER — VERAPAMIL HCL 2.5 MG/ML IV SOLN
INTRAVENOUS | Status: DC | PRN
  Administered 2017-01-14: 10 mL via INTRA_ARTERIAL

## 2017-01-14 MED ORDER — SODIUM CHLORIDE 0.9% FLUSH: 3.0000 mL | INTRAVENOUS | Status: DC | PRN

## 2017-01-14 MED ORDER — HEPARIN (PORCINE) IN NACL 2-0.9 UNIT/ML-% IJ SOLN
INTRAMUSCULAR | Status: AC | PRN
  Administered 2017-01-14: 1000 mL

## 2017-01-14 MED ORDER — SODIUM CHLORIDE 0.9% FLUSH
3.0000 mL | INTRAVENOUS | Status: DC | PRN
Start: 2017-01-14 — End: 2017-01-14

## 2017-01-14 MED ORDER — LIDOCAINE HCL (PF) 1 % IJ SOLN
INTRAMUSCULAR | Status: AC
  Filled 2017-01-14: qty 30

## 2017-01-14 MED ORDER — ONDANSETRON HCL 4 MG/2ML IJ SOLN: 4.0000 mg | Freq: Four times a day (QID) | INTRAMUSCULAR | Status: DC | PRN

## 2017-01-14 MED ORDER — VERAPAMIL HCL 2.5 MG/ML IV SOLN
INTRAVENOUS | Status: AC
  Filled 2017-01-14: qty 2

## 2017-01-14 MED ORDER — FENTANYL CITRATE (PF) 100 MCG/2ML IJ SOLN
INTRAMUSCULAR | Status: DC | PRN
  Administered 2017-01-14: 25 ug via INTRAVENOUS

## 2017-01-14 MED ORDER — SODIUM CHLORIDE 0.9 % WEIGHT BASED INFUSION
3.0000 mL/kg/h | INTRAVENOUS | Status: AC
  Administered 2017-01-14: 3 mL/kg/h via INTRAVENOUS

## 2017-01-14 MED ORDER — SODIUM CHLORIDE 0.9 % IV SOLN: 250.0000 mL | INTRAVENOUS | Status: DC | PRN

## 2017-01-14 SURGICAL SUPPLY — 13 items
CATH 5FR JL3.5 JR4 ANG PIG MP (CATHETERS) ×1 IMPLANT
CATH BALLN WEDGE 5F 110CM (CATHETERS) ×1 IMPLANT
CATH INFINITI 5 FR JR5 (CATHETERS) ×1 IMPLANT
CATH INFINITI 5FR AL1 (CATHETERS) ×1 IMPLANT
DEVICE RAD COMP TR BAND LRG (VASCULAR PRODUCTS) ×1 IMPLANT
GLIDESHEATH SLEND SS 6F .021 (SHEATH) ×1 IMPLANT
GUIDEWIRE INQWIRE 1.5J.035X260 (WIRE) IMPLANT
INQWIRE 1.5J .035X260CM (WIRE) ×2
KIT HEART LEFT (KITS) ×2 IMPLANT
PACK CARDIAC CATHETERIZATION (CUSTOM PROCEDURE TRAY) ×2 IMPLANT
SHEATH GLIDE SLENDER 4/5FR (SHEATH) ×1 IMPLANT
TRANSDUCER W/STOPCOCK (MISCELLANEOUS) ×2 IMPLANT
TUBING CIL FLEX 10 FLL-RA (TUBING) ×2 IMPLANT

## 2017-01-14 NOTE — H&P (View-Only) (Signed)
[address]       [place] [address]             [phone]    HPI: [name] [name] is sent for consultation re: a RUL lung nodule  [name] [name] is a 76 year old gentleman with a history of remote tobacco abuse (50 pack years, quit 11 years ago), COPD, hypertension, hyperlipidemia, severe aortic stenosis, stent graft repair of an abdominal aortic aneurysm in 2011 and bladder cancer. He saw his primary in April regarding shortness of breath. This would occur both with exertion and also when lying flat in bed. Chest x-ray showed a right upper lobe lung nodule. A CT of the chest confirmed a 17 mm spiculated nodule in the right upper lobe. There was no significant hilar or mediastinal adenopathy. A PET CT showed the nodule was hypermetabolic with an SUV of 6.7. There was no pathologic adenopathy although there were a couple borderline mediastinal nodes.  He has known severe aortic stenosis that has been followed with echocardiogram. He says that was first diagnosed about 2 years ago. He had a repeat echocardiogram in early May which showed a calcified aortic valve with a mean gradient of 55 and peak gradient of 89 mmHg. Calculated valve area was 0.63 cm. Systolic function was normal with an EF of 60%, there was left ventricular hypertrophy.  He gets short of breath with exertion and experiences a sensation of tightness in his chest working around the house or in the yard. He says he can walk about 150 yards before he has to stop. The tightness and shortness of breath are relieved with rest. He also has episodes where he gets short of breath lying flat on his back and has to sit upright in the bed. He has not had any rest chest tightness or pressure. 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 He also is noted some swelling in his legs recently. He thinks that started back in December around that time he had his first bladder procedure. His appetite is good. He has not had any significant  weight loss. He says he was told recently he had "a little bit" of COPD. He denies cough, hemoptysis, and wheezing. He also denies any unusual headaches or visual changes.  Zubrod Score: At the time of surgery this patient's most appropriate activity status/level should be described as: []     0    Normal activity, no symptoms [x]     1    Restricted in physical strenuous activity but ambulatory, able to do out light work []     2    Ambulatory and capable of self care, unable to do work activities, up and about >50 % of waking hours                              []     3    Only limited self care, in bed greater than 50% of waking hours []     4    Completely disabled, no self care, confined to bed or chair []     5    Moribund  Past Medical History:  Diagnosis Date  . AAA (abdominal aortic aneurysm) ([place])   . AAA (abdominal aortic aneurysm) ([place])   . Arthritis   . Atherosclerotic peripheral vascular disease ([place])   . Bilateral carotid artery stenosis   . Cancer [facility]) 2006 and Nov. 9, 2013  BCC, right shoulder  . COPD (chronic obstructive pulmonary disease) ([place])   . Hyperlipidemia   . Hypertension   . Joint pain   . Skin cancer   . Thyroid disease    hypothyroid  . Vitamin D deficiency   . Weight gain    Past Surgical History:  Procedure Laterality Date  . ABDOMINAL AORTIC ANEURYSM REPAIR  08-09-09  . BACK SURGERY    . HYDROCELE EXCISION / REPAIR    . SKIN CANCER EXCISION    . SPINE SURGERY    . TRANSURETHRAL RESECTION OF BLADDER TUMOR N/A 06/06/2016   Procedure: TRANSURETHRAL RESECTION OF BLADDER TUMOR (TURBT);  Surgeon: Franchot Gallo, MD;  Location: [facility];  Service: Urology;  Laterality: N/A;  . TRANSURETHRAL RESECTION OF BLADDER TUMOR N/A 07/22/2016   Procedure: TRANSURETHRAL RESECTION OF BLADDER TUMOR (TURBT);  Surgeon: Franchot Gallo, MD;  Location: [facility];  Service: Urology;  Laterality: N/A;   Current Outpatient Prescriptions  Medication Sig Dispense Refill  .  alfuzosin (UROXATRAL) 10 MG 24 hr tablet Take 10 mg by mouth at bedtime.     Marland Kitchen amLODipine (NORVASC) 5 MG tablet Take 1 tablet (5 mg total) by mouth daily. 30 tablet 5  . atorvastatin (LIPITOR) 40 MG tablet Take 1 tablet (40 mg total) by mouth daily. 30 tablet 5  . cholecalciferol (VITAMIN D) 1000 UNITS tablet Take 1 tablet (1,000 Units total) by mouth daily. 30 tablet 6  . levothyroxine (SYNTHROID, LEVOTHROID) 75 MCG tablet Take 1 tablet (75 mcg total) by mouth daily. 30 tablet 8  . lisinopril (PRINIVIL,ZESTRIL) 40 MG tablet Take 1 tablet (40 mg total) by mouth daily. 30 tablet 5  . Multiple Vitamin (ONE-A-DAY MENS PO) Take 1 tablet by mouth daily.      . Omega-3 Fatty Acids (FISH OIL) 1200 MG CAPS Take 2 capsules by mouth daily.      No current facility-administered medications for this visit.    Family History  Problem Relation Age of Onset  . Heart disease Father        Heart Disease before age 12  . Alcohol abuse Father   . Heart attack Father   . Cancer Mother        Lung  . COPD Mother   . COPD Sister   . Lupus Sister   . Hypertension Brother   . Gout Brother   . Heart attack Son   . Gout Brother   . Prostatitis Brother    Social History   Social History  . Marital status: Married    Spouse name: N/A  . Number of children: N/A  . Years of education: N/A   Occupational History  . 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 Smoking status: Former Smoker    Packs/day: 1.00    Years: 51.00    Types: Cigarettes    Start date: 11/25/1954    Quit date: 10/27/2005  . Smokeless tobacco: Never Used  . Alcohol use No     Comment: none since 1999  . Drug use: No  . Sexual activity: Yes   Other Topics Concern  . Not on file   Social History Narrative  . No narrative on file    Review of systems Chest tightness and shortness of breath with exertion. Orthopnea. Peripheral edema. Pain and feet when lying flat, leg cramps, pain in legs  with walking, joint pain and swelling. Under going treatment for bladder cancer  with BCG. No hematuria or dysuria. Headaches or visual changes Denies heartburn, abdominal pain, blood in stool. All other systems are negative  Physical Exam BP 131/67   Pulse 80   Resp 18   Ht 5\' 10"  (1.778 m)   Wt 181 lb (82.1 kg)   SpO2 97% Comment: on RA  BMI 25.31 kg/m  76 year old man in no acute distress Well-developed well-nourished Alert and oriented 3, cranial nerves II through XII intact, no motor deficit Neck supple no palpable adenopathy or thyromegaly, positive transmitted murmur versus bruit bilaterally Cardiac regular rate and rhythm with a 3/6 crescendo decrescendo systolic murmur heard throughout precordium Lungs clear bilaterally no rales or wheezing Abdomen soft and nontender Extremities no clubbing or cyanosis. 1+ peripheral edema both lower extremities Skin warm and dry  Diagnostic Tests: CT CHEST WITHOUT CONTRAST LOW-DOSE FOR LUNG CANCER SCREENING  TECHNIQUE: Multidetector CT imaging of the chest was performed following the standard protocol without IV contrast.  COMPARISON:  Chest CT 04/28/2011.  FINDINGS: Cardiovascular: Heart size is normal. There is no significant pericardial fluid, thickening or pericardial calcification. There is aortic atherosclerosis, as well as atherosclerosis of the great vessels of the mediastinum and the coronary arteries, including calcified atherosclerotic plaque in the left main, left anterior descending, left circumflex and right coronary arteries. Severe calcifications of the aortic valve.  Mediastinum/Nodes: No pathologically enlarged mediastinal or hilar lymph nodes. Please note that accurate exclusion of hilar adenopathy is limited on noncontrast CT scans. Esophagus is unremarkable in appearance. No axillary lymphadenopathy.  Lungs/Pleura: Large aggressive appearing nodule with highly irregular macrolobulated and slightly  spiculated margins in the anterior aspect of the right upper lobe (axial image 113 of series 3) with a volume derived mean diameter of 15.6 mm, highly suspicious for primary bronchogenic neoplasm. Additional sub solid nodule in the right upper lobe (axial image 137 of series 3) with a volume derived mean diameter of 10.7 mm. No acute consolidative airspace disease. No pleural effusions. Mild diffuse bronchial wall thickening with moderate centrilobular and paraseptal emphysema. Scattered areas of scarring are also noted throughout the periphery of the lungs, most evident in the posterior aspect of the left lower lobe.  Upper Abdomen: Aortic atherosclerosis.  Musculoskeletal: There are no aggressive appearing lytic or blastic lesions noted in the visualized portions of the skeleton.  IMPRESSION: 1. Lung-RADS 4BS, suspicious. Additional imaging evaluation or consultation with Pulmonology or Thoracic Surgery recommended. 2. The "S" modifier above refers to potentially clinically significant non lung cancer related findings. Specifically, there is aortic atherosclerosis, in addition to left main and 3 vessel coronary artery disease. Assessment for potential risk factor modification, dietary therapy or pharmacologic therapy may be warranted, if clinically indicated. 3. There are severe calcifications of the aortic valve. Echocardiographic correlation for evaluation of potential valvular dysfunction may be warranted if clinically indicated. 4. Mild diffuse bronchial wall thickening with moderate centrilobular and paraseptal emphysema; imaging findings suggestive of underlying COPD. These results will be called to the ordering clinician or representative by the Radiologist Assistant, and communication documented in the PACS or zVision Dashboard.   Electronically Signed   By: Vinnie Langton M.D.   On: 11/10/2016 09:13 NUCLEAR MEDICINE PET SKULL BASE TO THIGH  TECHNIQUE: 8.9  mCi F-18 FDG was injected intravenously. Full-ring PET imaging was performed from the skull base to thigh after the radiotracer. CT data was obtained and used for attenuation correction and anatomic localization.  FASTING BLOOD GLUCOSE:  Value: 114 mg/dl  COMPARISON:  Lung cancer  screening CT 11/10/2016  FINDINGS: NECK  No hypermetabolic lymph nodes in the neck.  CHEST  The anterior right upper lobe spiculated pulmonary nodule is markedly hypermetabolic with SUV max = 6.7. No definite hypermetabolic metastatic lymphadenopathy in the mediastinum or right hilum although 2 upper normal lymph nodes, an 8 mm short axis prevascular node seen on image 67 of series 4 and a 7 mm low right paratracheal lymph nodes seen on the same image showed low level FDG uptake at about background blood pool levels (SUV max 2-3).  ABDOMEN/PELVIS  No abnormal hypermetabolic activity within the liver, pancreas, adrenal glands, or spleen. No hypermetabolic lymph nodes in the abdomen or pelvis.  Focus of hypermetabolism identified in the right aspect of the prostate gland demonstrates SUV max = 7.7.  Tiny gallstones are evident. The patient is status post aortic endograft placement. Left colonic diverticulosis without diverticulitis is evident.  SKELETON  No focal hypermetabolic activity to suggest skeletal metastasis.  IMPRESSION: 1. Anterior right upper lobe spiculated pulmonary nodule is markedly hypermetabolic, consistent with neoplasm. 2. Upper normal size mediastinal lymph nodes show low level FDG accumulation. While these findings are not definite for metastatic disease, metastatic involvement cannot be excluded. No findings to suggest hypermetabolic metastatic disease in the neck, abdomen or pelvis. 3. Focal area of hypermetabolism identified in the right prostate gland. This does raise concern for prostate neoplasm.   Electronically Signed   By: Misty Stanley M.D.    On: 12/02/2016 10:20  ECHOCARDIOGRAM 10/27/2016 Study Conclusions  - Left ventricle: The cavity size was normal. Wall thickness was   increased in a pattern of mild LVH. Systolic function was normal.   The estimated ejection fraction was in the range of 60% to 65%. - Aortic valve: AV is thickened, calcified with restricted motion   Peak and mean gradients through the valve are 89 and 55 mm Hg   respectively consistent with severe AS. No significant change   from echo of Jan 2018 There was mild regurgitation. - Mitral valve: Calcified annulus. Mildly thickened leaflets .   There was mild regurgitation. - Left atrium: The atrium was severely dilated. - Right atrium: The atrium was mildly dilated. - Pulmonary arteries: PA peak pressure: 33 mm Hg (S).  Pulmonary function testing FVC 3.23 (76%) FEV1 1.98 (64%), 2.11 (69%) postbronchodilator FEV1/FVC 0.61 DLCO 20.32 (61%)  I personally reviewed the CT chest, PET/CT, and echocardiogram and concur with the findings noted above.  Impression: [name] [name] is a 76 year old gentleman with a complicated medical history including a 50-pack-year history of tobacco abuse, COPD, severe aortic stenosis, severe atherosclerotic disease of the ascending aorta, coronary calcification by CT, previous abdominal aortic aneurysm stent graft, bladder cancer, hypertension, and hyperlipidemia. He recently was found to have a 1.7 cm spiculated nodule on CT that is hypermetabolic by PET.  Lung nodule- this almost certainly is a new primary bronchogenic carcinoma and has to be considered that was can be proven otherwise. Clinical appearance is consistent with a stage IA non-small cell carcinoma that is potentially curable with surgical resection or possibly with stereotactic radiation. From a pulmonary standpoint he is a candidate for surgery, but at present his cardiovascular risk is prohibitive although potentially treatable.  Severe aortic stenosis- mean gradient of  55 and peak 89 mmHg. Calculated valve area 0.62 cm. He would not be able to tolerate a lobectomy without treating his aortic stenosis first. He has symptomatic with chest tightness and shortness of breath with exertion and also has  some early chronic diastolic heart failure symptoms- orthopnea and peripheral edema. His aortic valve definitely needs to be addressed. The question is how best to do so. Both surgical valve replacement and TAVR are potential options. Surgical valve replacement would come at significant risk of stroke due to his heavily calcified ascending aorta. He might be a candidate for a transapical TAVR, but that will depend on his coronary anatomy.  Coronary calcification on CT- he does have anginal symptoms which may be strictly attributable to his aortic stenosis. However, there is a high likelihood he has coronary artery disease. With that would be amenable to percutaneous interventional or would require surgical intervention remains to be determined. I think the first of his evaluation will be to do right and left heart catheterization to assess his coronaries is that will have an impact on what the options are regarding his aortic stenosis.  Ascending aortic atherosclerosis- while not quite a classical porcelain aorta he does have extensive calcified atherosclerotic plaque in the ascending aorta. This would make him relatively high risk for stroke with any cardiac intervention.  Plan: Right and left heart cath to assess coronary anatomy to help decide between surgical AVR versus TAVR.  MR brain-  New lung mass rule out metastases Return to office after those tests to discuss how to proceed.  Melrose Nakayama, MD [facility] [phone]

## 2017-01-14 NOTE — Interval H&P Note (Signed)
History and Physical Interval Note:  01/14/2017 10:30 AM  [name] [name]  has presented today for surgery, with the diagnosis of arotic stenosis  The various methods of treatment have been discussed with the patient and family. After consideration of risks, benefits and other options for treatment, the patient has consented to  Procedure(s): Right/Left Heart Cath and Coronary Angiography (N/A) as a surgical intervention .  The patient's history has been reviewed, patient examined, no change in status, stable for surgery.  I have reviewed the patient's chart and labs.  Questions were answered to the patient's satisfaction.     Sherren Mocha

## 2017-01-14 NOTE — Discharge Instructions (Signed)

## 2017-01-14 NOTE — Progress Notes (Signed)
Site area: right bracial vein  Site Prior to Removal:  Level 0 Pressure Applied For:10 min Manual:   yes Patient Status During Pull:  vss Post Pull Site:  Level 0 Post Pull Instructions Given:  yes Post Pull Pulses Present: yes Dressing Applied:  Pressure dressing  Bedrest begins @ na Comments:

## 2017-01-15 ENCOUNTER — Other Ambulatory Visit: Payer: Self-pay | Admitting: *Deleted

## 2017-01-15 DIAGNOSIS — I350 Nonrheumatic aortic (valve) stenosis: Secondary | ICD-10-CM

## 2017-01-15 MED FILL — Lidocaine HCl Local Preservative Free (PF) Inj 1%: INTRAMUSCULAR | Qty: 30 | Status: AC

## 2017-01-19 ENCOUNTER — Ambulatory Visit (INDEPENDENT_AMBULATORY_CARE_PROVIDER_SITE_OTHER): Payer: Medicare Other | Admitting: Pulmonary Disease

## 2017-01-19 ENCOUNTER — Encounter: Payer: Self-pay | Admitting: Pulmonary Disease

## 2017-01-19 DIAGNOSIS — R911 Solitary pulmonary nodule: Secondary | ICD-10-CM

## 2017-01-19 DIAGNOSIS — J449 Chronic obstructive pulmonary disease, unspecified: Secondary | ICD-10-CM

## 2017-01-19 NOTE — Progress Notes (Signed)
   Subjective:    Patient ID: [name] [name], male    DOB: Jun 27, 1940, 76 y.o.   MRN: [id number]  HPI  76 year old heavy ex-smoker for FU of Hypermetabolic RUL nodule He smoked more than 50 pack years until he quit in 2007.He was told in the past that he may have mild emphysema. He also has severe aortic stenosis .He has a history of AAA stent repair 2011. He was diagnosed with bladder cancer 06/2016 ([place]) and underwent transurethral resection of bladder tumors and is undergoing intravesical chemotherapy  He has undergone evaluation including cardiac cath, awaiting CT angiogram and then TAVR evaluation is planned  He denies dyspnea, cough, sputum production. Medication review shows lisinopril    Significant tests/ events reviewed  11/10/16 CT chest screening study 5/21 which showed 1.5 cm macrolobulated nodule in the right upper lobe with emphysema in both upper lobes. spirometry showed FEV1 of 53% with postbronchodilator improvement of 58%  CT chest 04/2011 >> pleural-based lesion in the left posterior sulcus but no similar nodules  Echo 10/2016 severe AS peak gr 89  PFTs 11/2016 FEV1 69% post bronchodilator, 2.1 L, FVC 79%, ratio 61, TLC 100% with DLCO 61%  PET showed hypermetabolic right upper lobe nodule and low-grade metabolic and right paratracheal and prevascular lymph node about 6-8 mm.  Review of Systems Patient denies significant dyspnea,cough, hemoptysis,  chest pain, palpitations, pedal edema, orthopnea, paroxysmal nocturnal dyspnea, lightheadedness, nausea, vomiting, abdominal or  leg pains      Objective:   Physical Exam  Gen. Pleasant, well-nourished, in no distress ENT - no thrush, no post nasal drip Neck: No JVD, no thyromegaly, no carotid bruits Lungs: no use of accessory muscles, no dullness to percussion, clear without rales or rhonchi  Cardiovascular: Rhythm regular, heart sounds  normal, no murmurs or gallops, no peripheral  edema Musculoskeletal: No deformities, no cyanosis or clubbing        Assessment & Plan:

## 2017-01-19 NOTE — Patient Instructions (Signed)
Good luck with prostate radiation and valve surgery  Contact me after, if you want to proceed with radiation rather than surgery

## 2017-01-19 NOTE — Assessment & Plan Note (Addendum)
Reassess after TAVR- if he opts for lobectomy, he will follow up with [facility] otherwise we can send him for SBRT

## 2017-01-19 NOTE — Assessment & Plan Note (Signed)
Does not need medications at this time Would be curious to see how much his dyspnea improves after TAVR surgery, Low risk from COPD standpoint

## 2017-01-20 ENCOUNTER — Ambulatory Visit (INDEPENDENT_AMBULATORY_CARE_PROVIDER_SITE_OTHER): Payer: Medicare Other | Admitting: Urology

## 2017-01-20 DIAGNOSIS — C678 Malignant neoplasm of overlapping sites of bladder: Secondary | ICD-10-CM | POA: Diagnosis not present

## 2017-01-27 ENCOUNTER — Encounter: Payer: Medicare Other | Admitting: Thoracic Surgery (Cardiothoracic Vascular Surgery)

## 2017-01-29 ENCOUNTER — Ambulatory Visit (HOSPITAL_COMMUNITY): Payer: Medicare Other

## 2017-01-29 ENCOUNTER — Ambulatory Visit (HOSPITAL_COMMUNITY)
Admission: RE | Admit: 2017-01-29 | Discharge: 2017-01-29 | Disposition: A | Discharge status: Home / Self Care | Payer: Medicare Other | Source: Ambulatory Visit | Attending: Cardiovascular Disease | Admitting: Cardiovascular Disease

## 2017-01-29 ENCOUNTER — Encounter: Payer: Self-pay | Admitting: Physical Therapy

## 2017-01-29 ENCOUNTER — Ambulatory Visit: Payer: Medicare Other | Attending: Cardiovascular Disease | Admitting: Physical Therapy

## 2017-01-29 DIAGNOSIS — K769 Liver disease, unspecified: Secondary | ICD-10-CM | POA: Diagnosis not present

## 2017-01-29 DIAGNOSIS — I350 Nonrheumatic aortic (valve) stenosis: Secondary | ICD-10-CM | POA: Insufficient documentation

## 2017-01-29 DIAGNOSIS — R2689 Other abnormalities of gait and mobility: Secondary | ICD-10-CM | POA: Diagnosis not present

## 2017-01-29 DIAGNOSIS — I2510 Atherosclerotic heart disease of native coronary artery without angina pectoris: Secondary | ICD-10-CM | POA: Diagnosis not present

## 2017-01-29 DIAGNOSIS — K8020 Calculus of gallbladder without cholecystitis without obstruction: Secondary | ICD-10-CM | POA: Diagnosis not present

## 2017-01-29 DIAGNOSIS — I700 Atherosclerosis of aorta: Secondary | ICD-10-CM | POA: Diagnosis not present

## 2017-01-29 DIAGNOSIS — R293 Abnormal posture: Secondary | ICD-10-CM

## 2017-01-29 DIAGNOSIS — Z952 Presence of prosthetic heart valve: Secondary | ICD-10-CM | POA: Diagnosis not present

## 2017-01-29 DIAGNOSIS — R911 Solitary pulmonary nodule: Secondary | ICD-10-CM | POA: Diagnosis not present

## 2017-01-29 DIAGNOSIS — J439 Emphysema, unspecified: Secondary | ICD-10-CM | POA: Diagnosis not present

## 2017-01-29 MED ORDER — METOPROLOL TARTRATE 5 MG/5ML IV SOLN
INTRAVENOUS | Status: AC
  Administered 2017-01-29: 5 mg via INTRAVENOUS
  Filled 2017-01-29: qty 5

## 2017-01-29 MED ORDER — METOPROLOL TARTRATE 5 MG/5ML IV SOLN
INTRAVENOUS | Status: AC
  Administered 2017-01-29: 5 mg via INTRAVENOUS
  Filled 2017-01-29: qty 15

## 2017-01-29 MED ORDER — IOPAMIDOL (ISOVUE-370) INJECTION 76%
INTRAVENOUS | Status: AC
  Administered 2017-01-29: 80 mL via INTRAVENOUS
  Filled 2017-01-29: qty 100

## 2017-01-29 MED ORDER — IOPAMIDOL (ISOVUE-370) INJECTION 76%
INTRAVENOUS | Status: AC
  Administered 2017-01-29: 30 mL via INTRAVENOUS
  Filled 2017-01-29: qty 50

## 2017-01-29 MED ORDER — METOPROLOL TARTRATE 5 MG/5ML IV SOLN
5.0000 mg | INTRAVENOUS | Status: DC | PRN
  Administered 2017-01-29 (×4): 5 mg via INTRAVENOUS
  Filled 2017-01-29 (×3): qty 5

## 2017-01-29 NOTE — Therapy (Signed)
[name], [place], [address] Phone: [phone]   Fax:  [phone]  Physical Therapy Evaluation  Patient Details  Name: [name] [name] MRN: [id number] Date of Birth: June 08, 1941 Referring Provider: Dr. Sherren Mocha  Encounter Date: 01/29/2017      PT End of Session - 01/29/17 1251    Visit Number 1   PT Start Time 2376   PT Stop Time 1318   PT Time Calculation (min) 32 min      Past Medical History:  Diagnosis Date  . AAA (abdominal aortic aneurysm) ([place])   . AAA (abdominal aortic aneurysm) ([place])   . Arthritis   . Atherosclerotic peripheral vascular disease ([place])   . Bilateral carotid artery stenosis   . Cancer [facility]) 2006 and Nov. 9, 2013    [facility], right shoulder  . COPD (chronic obstructive pulmonary disease) ([place])   . Hyperlipidemia   . Hypertension   . Joint pain   . Skin cancer   . Thyroid disease    hypothyroid  . Vitamin D deficiency   . Weight gain     Past Surgical History:  Procedure Laterality Date  . ABDOMINAL AORTIC ANEURYSM REPAIR  08-09-09  . BACK SURGERY    . HYDROCELE EXCISION / REPAIR    . SKIN CANCER EXCISION    . SPINE SURGERY    . TRANSURETHRAL RESECTION OF BLADDER TUMOR N/A 06/06/2016   Procedure: TRANSURETHRAL RESECTION OF BLADDER TUMOR (TURBT);  Surgeon: Franchot Gallo, MD;  Location: [facility];  Service: Urology;  Laterality: N/A;  . TRANSURETHRAL RESECTION OF BLADDER TUMOR N/A 07/22/2016   Procedure: TRANSURETHRAL RESECTION OF BLADDER TUMOR (TURBT);  Surgeon: Franchot Gallo, MD;  Location: [facility];  Service: Urology;  Laterality: N/A;    There were no vitals filed for this visit.       Subjective Assessment - 01/29/17 1252    Subjective Pt reports shortness of breath beginning back in April 2018 as well as some chest tightness. Pt is also actively being treated for bladder CA dx in Dec 2017 and awaiting possible tx for newly diagnoses lung CA. Symptoms  are generally aggravated when working in yard. He is requiring a lot more rest breaks.    Patient Stated Goals to fix heart so he can fix lung cancer   Currently in Pain? No/denies            [facility] PT Assessment - 01/29/17 0001      Assessment   Medical Diagnosis severe aortic stenois   Referring Provider Dr. Sherren Mocha   Onset Date/Surgical Date --  April 2017     Precautions   Precautions None     Restrictions   Weight Bearing Restrictions No     Balance Screen   Has the patient fallen in the past 6 months No   Has the patient had a decrease in activity level because of a fear of falling?  No   Is the patient reluctant to leave their home because of a fear of falling?  No     Home Environment   Living Environment Private residence   Living Arrangements Spouse/significant other   Home Access Stairs to enter   Entrance Stairs-Number of Steps 4   Entrance Stairs-Rails Right;Left;Can reach both     Prior Function   Level of Independence Independent with community mobility without device;Independent with household mobility without device   Leisure Plays bass guitar from seated position  Posture/Postural Control   Posture/Postural Control Postural limitations   Postural Limitations Rounded Shoulders;Forward head     ROM / Strength   AROM / PROM / Strength AROM;Strength     AROM   Overall AROM Comments grossly WNL throughout     Strength   Overall Strength Comments grossly WNL   Strength Assessment Site Hand   Right/Left hand Right;Left   Right Hand Grip (lbs) 105   Left Hand Grip (lbs) 107  L hand dominant     Ambulation/Gait   Gait Comments Ambulates with bil. hip ER and excessive lateral trunk sway. Gait distance is limited  by 64% for his age/gender due to bil. hip pain 7/10 which in turn increases his SOB.           [facility] Pre-Surgical Assessment - 01/29/17 0001    5 Meter Walk Test- trial 1 5 sec   5 Meter Walk Test- trial 2 5 sec.    5 Meter  Walk Test- trial 3 5 sec.   5 meter walk test average 5 sec   4 Stage Balance Test tolerated for:  7 sec.   4 Stage Balance Test Position 3   Sit To Stand Test- trial 1 12 sec.   ADL/IADL Independent with: Bathing;Dressing;Meal prep;Finances;Yard work   6 Minute Walk- Baseline yes   BP (mmHg) 98/60   HR (bpm) 64   02 Sat (%RA) 99 %   Modified Borg Scale for Dyspnea 0- Nothing at all   Perceived Rate of Exertion (Borg) 6-   6 Minute Walk Post Test yes   BP (mmHg) 97/50   HR (bpm) 88   02 Sat (%RA) 99 %   Modified Borg Scale for Dyspnea 2- Mild shortness of breath   Perceived Rate of Exertion (Borg) 13- Somewhat hard          Objective measurements completed on examination: See above findings.                  PT Education - 01/29/17 1318    Education provided Yes   Education Details fall risk   Person(s) Educated Patient   Methods Explanation   Comprehension Verbalized understanding                     Plan - 01/29/17 1318    Clinical Impression Statement see below   PT Frequency One time visit     Clinical Impression Statement: Pt is a 76 yo male presenting to OP PT for evaluation prior to possible TAVR surgery due to severe aortic stenosis. Pt reports onset of shortness of breath with activity approximately 4 months ago. Symptoms are limiting his ability to do yardwork without rest breaks every couple of minutes. Pt presents with good ROM and strength, fair balance and is at high fall risk 4 stage balance test, good walking speed and poor aerobic endurance per 6 minute walk test. Pt ambulated 360 feet in 2:00 before requesting a seated rest beak lasting 1:40. Pt request rest due to 7/10 hip pain which exacerbates his SOB. Pt able to resume after rest and ambulate an additional 255 feet and then needed to stop the test at the 5:00. Pt ambulated a total of 615 feet in 6 minute walk. Systolic BP was unchanged but diastolic decreased after the 6 minute  walk. Based on the Short Physical Performance Battery, patient has a frailty rating of 10/12 with </= 5/12 considered frail.   Patient demonstrated the following deficits  and impairments:     Visit Diagnosis: Other abnormalities of gait and mobility  Abnormal posture      G-Codes - 02/07/17 1333    Functional Assessment Tool Used (Outpatient Only) 6 minute walk 615'   Functional Limitation Mobility: Walking and moving around   Mobility: Walking and Moving Around Current Status [phone]) At least 60 percent but less than 80 percent impaired, limited or restricted   Mobility: Walking and Moving Around Goal Status [phone]) At least 60 percent but less than 80 percent impaired, limited or restricted   Mobility: Walking and Moving Around Discharge Status [phone]) At least 60 percent but less than 80 percent impaired, limited or restricted       Problem List Patient Active Problem List   Diagnosis Date Noted  . COPD (chronic obstructive pulmonary disease) ([place]) 12/09/2016  . Solitary pulmonary nodule present on computed tomography of lung 11/21/2016  . Aortic stenosis 12/11/2014  . Vitamin D deficiency   . BMI 26.0-26.9,adult 05/18/2013  . Fatty liver 02/24/2013  . Gallbladder polyp 02/24/2013  . HLD (hyperlipidemia) 01/14/2013  . HTN (hypertension) 01/14/2013  . Hypothyroidism 01/14/2013  . S/P AAA repair 05/31/2012  . Carotid stenosis 05/31/2012    NICOLETTA,DANA, PT 07-Feb-2017, 1:33 PM  [place], [place], [address] Phone: [phone]   Fax:  [phone]  Name: DARELL SAPUTO MRN: [id number] Date of Birth: Jan 16, 1941

## 2017-02-04 ENCOUNTER — Other Ambulatory Visit: Payer: Self-pay

## 2017-02-04 ENCOUNTER — Institutional Professional Consult (permissible substitution) (INDEPENDENT_AMBULATORY_CARE_PROVIDER_SITE_OTHER): Payer: Medicare Other | Admitting: Surgery

## 2017-02-04 ENCOUNTER — Encounter: Payer: Self-pay | Admitting: Surgery

## 2017-02-04 VITALS — BP 130/78 | HR 92 | Resp 20 | Ht 70.0 in | Wt 181.0 lb

## 2017-02-04 DIAGNOSIS — R911 Solitary pulmonary nodule: Secondary | ICD-10-CM

## 2017-02-04 DIAGNOSIS — I4819 Other persistent atrial fibrillation: Secondary | ICD-10-CM

## 2017-02-04 DIAGNOSIS — I350 Nonrheumatic aortic (valve) stenosis: Secondary | ICD-10-CM

## 2017-02-04 MED ORDER — APIXABAN 5 MG PO TABS: 5.0000 mg | ORAL_TABLET | Freq: Two times a day (BID) | ORAL | 3 refills | Status: DC

## 2017-02-04 NOTE — Telephone Encounter (Signed)
RX for Eliquis 5 mg po BID #60/3 refill called to [facility].

## 2017-02-05 ENCOUNTER — Other Ambulatory Visit: Payer: Self-pay | Admitting: *Deleted

## 2017-02-05 DIAGNOSIS — I350 Nonrheumatic aortic (valve) stenosis: Secondary | ICD-10-CM

## 2017-02-09 ENCOUNTER — Encounter: Payer: Self-pay | Admitting: Surgery

## 2017-02-09 NOTE — Progress Notes (Signed)
Patient ID: VITALY WANAT, male   DOB: 1940-11-16, 76 y.o.   MRN: [id number]  [place] SURGERY CONSULTATION REPORT  Referring Provider is Herminio Commons, MD PCP is [name], FNP  Chief Complaint  Patient presents with  . Aortic Stenosis    Surgical eval for TAVR,review all studies    HPI:  The patient is a 76 year old gentleman with hypertension, hyperlipidemia, COPD with a 63 pk-yr smoking history until he quit 11 years ago, AAA s/p stent graft repair in 2011, bladder cancer, and aortic stenosis followed by echo. He recently saw Dr. Roxan Hockey for evaluation of a 17 mm spiculated nodule in the RUL that was hypermetabolic on PET scan. This was found on a lung cancer screening CT on 11/10/2016. There was no pathologic adenopathy. His most recent echo on 10/28/2016 showed a mean gradient of 55 mm Hg with normal LV function. Cath on 01/14/2017 showed 60% proximal LAD stenosis with mild non-obstructive disease in the LCX and RCA. Dr. Burt Knack felt that his LAD stenosis could be managed medically.  He says that he has shortness of breath and fatigue with exertion and has some tightness in his chest with doing light work around the house. He can walk about 150 yds before he has to stop to rest. He gets some swelling in his lower legs. He denies dizziness and syncope.  Past Medical History:  Diagnosis Date  . AAA (abdominal aortic aneurysm) ([place])   . AAA (abdominal aortic aneurysm) ([place])   . Arthritis   . Atherosclerotic peripheral vascular disease ([place])   . Bilateral carotid artery stenosis   . Cancer [facility]) 2006 and Nov. 9, 2013    [facility], right shoulder  . COPD (chronic obstructive pulmonary disease) ([place])   . Hyperlipidemia   . Hypertension   . Joint pain   . Skin cancer   . Thyroid disease    hypothyroid  . Vitamin D deficiency   . Weight gain     Past Surgical History:  Procedure Laterality  Date  . ABDOMINAL AORTIC ANEURYSM REPAIR  08-09-09  . BACK SURGERY    . HYDROCELE EXCISION / REPAIR    . SKIN CANCER EXCISION    . SPINE SURGERY    . TRANSURETHRAL RESECTION OF BLADDER TUMOR N/A 06/06/2016   Procedure: TRANSURETHRAL RESECTION OF BLADDER TUMOR (TURBT);  Surgeon: Franchot Gallo, MD;  Location: [facility];  Service: Urology;  Laterality: N/A;  . TRANSURETHRAL RESECTION OF BLADDER TUMOR N/A 07/22/2016   Procedure: TRANSURETHRAL RESECTION OF BLADDER TUMOR (TURBT);  Surgeon: Franchot Gallo, MD;  Location: [facility];  Service: Urology;  Laterality: N/A;    Family History  Problem Relation Age of Onset  . Heart disease Father        Heart Disease before age 46  . Alcohol abuse Father   . Heart attack Father   . Cancer Mother        Lung  . COPD Mother   . COPD Sister   . Lupus Sister   . Hypertension Brother   . Gout Brother   . Heart attack Son   . Gout Brother   . Prostatitis Brother     Social History   Social History  . Marital status: Married    Spouse name: N/A  . Number of children: N/A  . Years of education: N/A   Occupational History  . Retired     Librarian, academic with [employer]  Social History Main Topics  . Smoking status: Former Smoker    Packs/day: 1.00    Years: 51.00    Types: Cigarettes    Start date: 11/25/1954    Quit date: 10/27/2005  . Smokeless tobacco: Never Used  . Alcohol use No     Comment: none since 1999  . Drug use: No  . Sexual activity: Yes   Other Topics Concern  . Not on file   Social History Narrative  . No narrative on file    Current Outpatient Prescriptions  Medication Sig Dispense Refill  . alfuzosin (UROXATRAL) 10 MG 24 hr tablet Take 10 mg by mouth at bedtime.     Marland Kitchen amLODipine (NORVASC) 5 MG tablet Take 1 tablet (5 mg total) by mouth daily. 30 tablet 5  . atorvastatin (LIPITOR) 40 MG tablet Take 1 tablet (40 mg total) by mouth daily. 30 tablet 5  . cholecalciferol (VITAMIN D) 1000 UNITS tablet Take 1 tablet  (1,000 Units total) by mouth daily. 30 tablet 6  . ibuprofen (ADVIL,MOTRIN) 200 MG tablet Take 400 mg by mouth every 6 (six) hours as needed for headache or mild pain.    Marland Kitchen levothyroxine (SYNTHROID, LEVOTHROID) 75 MCG tablet Take 1 tablet (75 mcg total) by mouth daily. 30 tablet 8  . lisinopril (PRINIVIL,ZESTRIL) 40 MG tablet Take 1 tablet (40 mg total) by mouth daily. 30 tablet 5  . Multiple Vitamin (ONE-A-DAY MENS PO) Take 1 tablet by mouth daily.      . Omega-3 Fatty Acids (FISH OIL) 1200 MG CAPS Take 2,400 mg by mouth daily.     Marland Kitchen apixaban (ELIQUIS) 5 MG TABS tablet Take 1 tablet (5 mg total) by mouth 2 (two) times daily. 60 tablet 3   No current facility-administered medications for this visit.     No Known Allergies    Review of Systems:   General:  normal appetite, decreased energy, no weight gain, no weight loss, no fever  Cardiac:  has chest pain with exertion, no chest pain at rest, has SOB with mild exertion, no resting SOB, no PND, has orthopnea, no palpitations, no arrhythmia, no atrial fibrillation, has LE edema, no dizzy spells, no syncope  Respiratory:  exertional shortness of breath, no home oxygen, no productive cough, no dry cough, no bronchitis, no wheezing, no hemoptysis, no asthma, no pain with inspiration or cough, no sleep apnea, no CPAP at night  GI:   no difficulty swallowing, no reflux, no frequent heartburn, no hiatal hernia, no abdominal pain, no constipation, no diarrhea, no hematochezia, no hematemesis, no melena  GU:   no dysuria,  no frequency, no urinary tract infection, no hematuria, no enlarged prostate, no kidney stones, no kidney disease, hx of bladder cancer treated with BCG.  Vascular:  has pain suggestive of claudication, has pain in feet, has leg cramps, no varicose veins, no DVT, no non-healing foot ulcer  Neuro:   no stroke, no TIA's, no seizures, no headaches, no temporary blindness one eye,  no slurred speech, no peripheral neuropathy, no chronic  pain, no instability of gait, no memory/cognitive dysfunction  Musculoskeletal: no arthritis, no joint swelling, no myalgias, no difficulty walking, normal mobility   Skin:   no rash, no itching, no skin infections, no pressure sores or ulcerations  Psych:   no anxiety, no depression, no nervousness, no unusual recent stress  Eyes:   no blurry vision, no floaters, no recent vision changes,  wears glasses  ENT:   no hearing loss,  no loose or painful teeth, no dentures, last saw dentist in the past year  Hematologic:  no easy bruising, no abnormal bleeding, no clotting disorder, no frequent epistaxis  Endocrine:  no diabetes, does not check CBG's at home         Physical Exam:   BP 130/78   Pulse 92   Resp 20   Ht 5\' 10"  (1.778 m)   Wt 181 lb (82.1 kg)   SpO2 95% Comment: RA  BMI 25.97 kg/m   General:  Elderly but  well-appearing  HEENT:  Unremarkable, NCAT, PERLA, EOMI, oropharynx clear  Neck:   no JVD, no bruits, no adenopathy or thyromegaly  Chest:   clear to auscultation, symmetrical breath sounds, no wheezes, no rhonchi   CV:   RRR, grade III/VI crescendo/decrescendo murmur heard best at RSB,  no diastolic murmur  Abdomen:  soft, non-tender, no masses or organomegaly  Extremities:  warm, well-perfused, pulses palpable, no LE edema  Rectal/GU  Deferred  Neuro:   Grossly non-focal and symmetrical throughout  Skin:   Clean and dry, no rashes, no breakdown   Diagnostic Tests:      *[facility]                         [address], [place] [address]                            [phone]  ------------------------------------------------------------------- Transthoracic Echocardiography  Patient:    Kendal, Raffo MR #:       [id number] Study Date: 10/27/2016 Gender:     M Age:        5 Height:     177.8 cm Weight:     81.6 kg BSA:        2.02 m^2 Pt. Status: Room:   ATTENDING    Hassell Done, [place]   [place], [place]  Luna Fuse, [name] [id number]  PERFORMING   [facility], Forestine Na  SONOGRAPHER  Alvino Chapel, RCS  cc:  ------------------------------------------------------------------- LV EF: 60% -   65%  ------------------------------------------------------------------- Indications:      Aortic stenosis 424.1.  ------------------------------------------------------------------- History:   PMH:  S/P AAA repair, Carotid stenosis.  Risk factors: Hypertension. Dyslipidemia.  ------------------------------------------------------------------- Study Conclusions  - Left ventricle: The cavity size was normal. Wall thickness was   increased in a pattern of mild LVH. Systolic function was normal.   The estimated ejection fraction was in the range of 60% to 65%. - Aortic valve: AV is thickened, calcified with restricted motion   Peak and mean gradients through the valve are 89 and 55 mm Hg   respectively consistent with severe AS. No significant change   from echo of Jan 2018 There was mild regurgitation. - Mitral valve: Calcified annulus. Mildly thickened leaflets .   There was mild regurgitation. - Left atrium: The atrium was severely dilated. - Right atrium: The atrium was mildly dilated. - Pulmonary arteries: PA peak pressure: 33 mm Hg (S).  ------------------------------------------------------------------- Study data:  Comparison was made to the study of 07/02/2016.  Study status:  Routine.  Procedure:  The patient reported no pain pre or post test. Transthoracic echocardiography. Image quality was adequate.  Study completion:  There were no complications. Transthoracic echocardiography.  M-mode, complete 2D, spectral Doppler, and color Doppler.  Birthdate:  Patient birthdate: 1941-01-31.  Age:  Patient is 76 yr old.  Sex:  Gender: male. BMI: 25.8 kg/m^2.  Blood pressure:     105/60  Patient status: Inpatient.  Study date:  Study  date: 10/27/2016. Study time: 08:28 AM.  Location:  Echo laboratory.  -------------------------------------------------------------------  ------------------------------------------------------------------- Left ventricle:  The cavity size was normal. Wall thickness was increased in a pattern of mild LVH. Systolic function was normal. The estimated ejection fraction was in the range of 60% to 65%.  ------------------------------------------------------------------- Aortic valve:  AV is thickened, calcified with restricted motion Peak and mean gradients through the valve are 89 and 55 mm Hg respectively consistent with severe AS. No significant change from echo of Jan 2018  Doppler:  There was mild regurgitation.    VTI ratio of LVOT to aortic valve: 0.2. Valve area (VTI): 0.63 cm^2. Indexed valve area (VTI): 0.31 cm^2/m^2. Peak velocity ratio of LVOT to aortic valve: 0.21. Valve area (Vmax): 0.66 cm^2. Indexed valve area (Vmax): 0.33 cm^2/m^2. Mean velocity ratio of LVOT to aortic valve: 0.22. Valve area (Vmean): 0.7 cm^2. Indexed valve area (Vmean): 0.35 cm^2/m^2.    Mean gradient (S): 55 mm Hg. Peak gradient (S): 89 mm Hg.  ------------------------------------------------------------------- Mitral valve:   Calcified annulus. Mildly thickened leaflets . Doppler:  There was mild regurgitation.    Peak gradient (D): 6 mm Hg.  ------------------------------------------------------------------- Left atrium:  The atrium was severely dilated.  ------------------------------------------------------------------- Right ventricle:  The cavity size was normal. Wall thickness was normal. Systolic function was normal.  ------------------------------------------------------------------- Pulmonic valve:    Structurally normal valve.   Cusp separation was normal.  Doppler:  Transvalvular velocity was within the normal range. There was no  regurgitation.  ------------------------------------------------------------------- Tricuspid valve:   Structurally normal valve.   Leaflet separation was normal.  Doppler:  Transvalvular velocity was within the normal range. There was trivial regurgitation.  ------------------------------------------------------------------- Right atrium:  The atrium was mildly dilated.  ------------------------------------------------------------------- Pericardium:  There was no pericardial effusion.  ------------------------------------------------------------------- Systemic veins: Inferior vena cava: The vessel was dilated. The respirophasic diameter changes were blunted (< 50%), consistent with elevated central venous pressure.  ------------------------------------------------------------------- Measurements   Left ventricle                           Value          Reference  LV ID, ED, PLAX chordal                  48.1  mm       43 - 52  LV ID, ES, PLAX chordal                  31.2  mm       23 - 38  LV fx shortening, PLAX chordal           35    %        >=29  LV PW thickness, ED                      13.2  mm       ----------  IVS/LV PW ratio, ED                      0.98           <=  1.3  Stroke volume, 2D                        78    ml       ----------  Stroke volume/bsa, 2D                    39    ml/m^2   ----------  LV ejection fraction, 1-p A4C            56    %        ----------  LV end-diastolic volume, 2-p             73    ml       ----------  LV end-systolic volume, 2-p              29    ml       ----------  LV ejection fraction, 2-p                61    %        ----------  Stroke volume, 2-p                       44    ml       ----------  LV end-diastolic volume/bsa, 2-p         36    ml/m^2   ----------  LV end-systolic volume/bsa, 2-p          14    ml/m^2   ----------  Stroke volume/bsa, 2-p                   21.9  ml/m^2   ----------    Ventricular septum                        Value          Reference  IVS thickness, ED                        13    mm       ----------    LVOT                                     Value          Reference  LVOT ID, S                               20    mm       ----------  LVOT area                                3.14  cm^2     ----------  LVOT peak velocity, S                    99.2  cm/s     ----------  LVOT mean velocity, S                    76.8  cm/s     ----------  LVOT VTI, S  24.7  cm       ----------  LVOT peak gradient, S                    4     mm Hg    ----------    Aortic valve                             Value          Reference  Aortic valve peak velocity, S            472   cm/s     ----------  Aortic valve mean velocity, S            344   cm/s     ----------  Aortic valve VTI, S                      123   cm       ----------  Aortic mean gradient, S                  55    mm Hg    ----------  Aortic peak gradient, S                  89    mm Hg    ----------  VTI ratio, LVOT/AV                       0.2            ----------  Aortic valve area, VTI                   0.63  cm^2     ----------  Aortic valve area/bsa, VTI               0.31  cm^2/m^2 ----------  Velocity ratio, peak, LVOT/AV            0.21           ----------  Aortic valve area, peak velocity         0.66  cm^2     ----------  Aortic valve area/bsa, peak              0.33  cm^2/m^2 ----------  velocity  Velocity ratio, mean, LVOT/AV            0.22           ----------  Aortic valve area, mean velocity         0.7   cm^2     ----------  Aortic valve area/bsa, mean              0.35  cm^2/m^2 ----------  velocity  Aortic regurg pressure half-time         434   ms       ----------    Aorta                                    Value          Reference  Aortic root ID, ED                       34    mm       ----------  Left atrium                              Value          Reference  LA ID,  A-P, ES                           45    mm       ----------  LA ID/bsa, A-P                   (H)     2.23  cm/m^2   <=2.2  LA volume, S                             93.4  ml       ----------  LA volume/bsa, S                         46.3  ml/m^2   ----------  LA volume, ES, 1-p A4C                   105   ml       ----------  LA volume/bsa, ES, 1-p A4C               52    ml/m^2   ----------  LA volume, ES, 1-p A2C                   77.3  ml       ----------  LA volume/bsa, ES, 1-p A2C               38.3  ml/m^2   ----------    Mitral valve                             Value          Reference  Mitral E-wave peak velocity              118   cm/s     ----------  Mitral deceleration time                 229   ms       150 - 230  Mitral peak gradient, D                  6     mm Hg    ----------  Mitral maximal regurg velocity,          592   cm/s     ----------  PISA  Mitral regurg VTI, PISA                  192   cm       ----------    Pulmonary arteries                       Value          Reference  PA pressure, S, DP               (H)     33    mm Hg    <=30    Tricuspid valve  Value          Reference  Tricuspid regurg peak velocity           251   cm/s     ----------  Tricuspid peak RV-RA gradient            25    mm Hg    ----------    Right atrium                             Value          Reference  RA ID, S-I, ES, A4C              (H)     74    mm       34 - 49  RA area, ES, A4C                 (H)     27.7  cm^2     8.3 - 19.5  RA volume, ES, A/L                       87.7  ml       ----------  RA volume/bsa, ES, A/L                   43.4  ml/m^2   ----------    Systemic veins                           Value          Reference  Estimated CVP                            8     mm Hg    ----------    Right ventricle                          Value          Reference  RV ID, ED, PLAX                          24.8  mm       19 - 38  TAPSE                                     16.8  mm       ----------  RV pressure, S, DP               (H)     33    mm Hg    <=30  Legend: (L)  and  (H)  mark values outside specified reference range.  ------------------------------------------------------------------- Prepared and Electronically Authenticated by  Dorris Carnes, M.D. 2018-05-07T15:49:04    Physicians   Panel Physicians Referring Physician Case Authorizing Physician  Sherren Mocha, MD (Primary)    Procedures   Right Heart Cath and Coronary Angiography  Conclusion   1. Moderate proximal LAD stenosis 2. Patent left main, left circumflex, and RCA with mild nonobstructive disease 3. Severely calcified and restricted aortic valve with known severe aortic stenosis by noninvasive assessment 4. Mild pulmonary hypertension likely related to LV diastolic dysfunction in the setting of severe  aortic stenosis  Recommendation: The patient will continue evaluation for aortic valve replacement for treatment of severe symptomatic aortic stenosis. Will discuss his case further with Dr. Roxan Hockey. Likely move forward with TAVR evaluation with CT angio studies as next step. I think his proximal LAD stenosis can be treated medically.  Indications   Severe aortic stenosis [I35.0 (ICD-10-CM)]  Procedural Details/Technique   Technical Details INDICATION: Severe aortic stenosis  PROCEDURAL DETAILS: There was an indwelling IV in a right antecubital vein. Using normal sterile technique, the IV was changed out for a 5 Fr brachial sheath over a 0.018 inch wire. The right wrist was then prepped, draped, and anesthetized with 1% lidocaine. Using the modified Seldinger technique a 5/6 French Slender sheath was placed in the right radial artery. Intra-arterial verapamil was administered through the radial artery sheath. IV heparin was administered after a JR4 catheter was advanced into the central aorta. A Swan-Ganz catheter was used for the right heart  catheterization. Standard protocol was followed for recording of right heart pressures and sampling of oxygen saturations. Fick cardiac output was calculated. Standard Judkins catheters were used for selective coronary angiography. A JR 5 catheter is used to inject the right coronary artery. There were no immediate procedural complications. The patient was transferred to the post catheterization recovery area for further monitoring.     Estimated blood loss <50 mL.  During this procedure the patient was administered the following to achieve and maintain moderate conscious sedation: Versed 1 mg, Fentanyl 25 mcg, while the patient's heart rate, blood pressure, and oxygen saturation were continuously monitored. The period of conscious sedation was 29 minutes, of which I was present face-to-face 100% of this time.    Coronary Findings   Dominance: Right  Left Anterior Descending  Prox LAD lesion, 60% stenosed. The lesion is discrete. There is an eccentric 50-60% proximal LAD stenosis.  Ramus Intermedius  Vessel is small.  Left Circumflex  First Obtuse Marginal Branch  Ost 1st Mrg lesion, 50% stenosed. Mild to moderate ostial OM1 stenosis  Right Coronary Artery  There is mild the vessel. The vessel is moderately calcified. There is mild diffuse nonobstructive stenosis of the RCA  Left Heart   Aortic Valve The aortic valve is calcified. There is restricted aortic valve motion.    Coronary Diagrams   Diagnostic Diagram       Implants     No implant documentation for this case.  PACS Images   Show images for Cardiac catheterization   Link to Procedure Log   Procedure Log    Hemo Data    Most Recent Value  Fick Cardiac Output 6.18 L/min  Fick Cardiac Output Index 3.09 (L/min)/BSA  RA A Wave 11 mmHg  RA V Wave 13 mmHg  RA Mean 11 mmHg  RV Systolic Pressure 44 mmHg  RV Diastolic Pressure 3 mmHg  RV EDP 11 mmHg  PA Systolic Pressure 44 mmHg  PA Diastolic Pressure 26 mmHg   PA Mean 36 mmHg  PW A Wave 30 mmHg  PW V Wave 36 mmHg  PW Mean 26 mmHg  AO Systolic Pressure 664 mmHg  AO Diastolic Pressure 61 mmHg  AO Mean 80 mmHg  QP/QS 1  TPVR Index 11.64 HRUI  TSVR Index 25.88 HRUI  PVR SVR Ratio 0.14  TPVR/TSVR Ratio 0.45    CT CORONARY MORPH W/CTA COR W/SCORE W/CA W/CM &/OR WO/CM (Accession [id number]) (Order [id number])  Imaging  Date: 01/29/2017 Department: [facility] CT IMAGING Released By:  Reggy Eye Authorizing: Sherren Mocha, MD  Exam Information   Status Exam Begun  Exam Ended   Final [99] 01/29/2017 11:29 AM 01/29/2017 12:21 PM  PACS Images   Show images for CT CORONARY MORPH W/CTA COR W/SCORE W/CA W/CM &/OR WO/CM  Addendum   ADDENDUM REPORT: 01/31/2017 11:50  CLINICAL DATA:  76 year old male with severe aortic stenosis being evaluated for a TAVR procedure.  EXAM: Cardiac TAVR CT  TECHNIQUE: The patient was scanned on a [employer]. A 120 kV retrospective scan was triggered in the descending thoracic aorta at 111 HU's. Gantry rotation speed was 250 msecs and collimation was .6 mm. No beta blockade or nitro were given. The 3D data set was reconstructed in 5% intervals of the R-R cycle. Systolic and diastolic phases were analyzed on a dedicated work station using MPR, MIP and VRT modes. The patient received 80 cc of contrast.  FINDINGS: Aortic Valve: Trileaflet, severely thickened and symmetrically calcified aortic valve with severely restricted leaflet opening. Mild calcifications extending into LVOT.  Aorta: Normal size, mild diffuse calcifications, moderate plaque, no dissection.  Sinotubular Junction:  27 x 27 mm  Ascending Thoracic Aorta:  33 x 31 mm  Aortic Arch:  29 x 26 mm  Descending Thoracic Aorta:  26 x 23 mm  Sinus of Valsalva Measurements:  Non-coronary:  32 mm  Right -coronary:  32 mm  Left -coronary:  34 mm  Coronary Artery Height above  Annulus:  Left Main:  13 mm  Right Coronary:  17 mm  Virtual Basal Annulus Measurements:  Maximum/Minimum Diameter:  27 x 23 mm  Perimeter:  79 mm  Area:  483 mm2  Optimum Fluoroscopic Angle for Delivery:  LAO 5 CAU 6  IMPRESSION: 1. Trileaflet, severely thickened and symmetrically calcified aortic valve with severely restricted leaflet opening and mild calcifications extending into LVOT. Annular measurement suitable for delivery of a 26 mm Edwards-SAPIEN 3 valve.  2.  Sufficient coronary to annulus distance.  3. Optimum Fluoroscopic Angle for Delivery:  LAO 5 CAU 6  4. No thrombus in the left atrial appendage.  Ena Dawley   Electronically Signed   By: Ena Dawley   On: 01/31/2017 11:50       CLINICAL DATA:  76 year old male with history of severe aortic stenosis. Preprocedural study prior to potential transcatheter aortic valve replacement (TAVR) procedure.  EXAM: CT ANGIOGRAPHY CHEST, ABDOMEN AND PELVIS  TECHNIQUE: Multidetector CT imaging through the chest, abdomen and pelvis was performed using the standard protocol during bolus administration of intravenous contrast. Multiplanar reconstructed images and MIPs were obtained and reviewed to evaluate the vascular anatomy.  CONTRAST:  80 mL of Isovue 370.  COMPARISON:  PET-CT 12/02/2016. CT the chest 11/01/2016. CT the abdomen and pelvis 08/05/2015.  FINDINGS: CTA CHEST FINDINGS  Cardiovascular: Heart size is moderately enlarged with biatrial dilatation. Filling defect in the left atrial appendage (axial image 55 of series 14), concerning for potential left atrial appendage thrombus. There is no significant pericardial fluid, thickening or pericardial calcification. There is aortic atherosclerosis, as well as atherosclerosis of the great vessels of the mediastinum and the coronary arteries, including calcified atherosclerotic plaque in the left main, left anterior  descending, left circumflex and right coronary arteries. Severe thickening calcification of the aortic valve.  Mediastinum/Lymph Nodes: Several prominent but non pathologically enlarged mediastinal and hilar lymph nodes are noted measuring up to 8 mm (nonspecific). Esophagus is normal in appearance. No axillary lymphadenopathy.  Lungs/Pleura: Again noted is a  macrolobulated nodule in the periphery of the right upper lobe which currently measures 2.3 x 1.4 x 1.6 cm (axial image 41 of series 15 and coronal image 59 of series 16), which demonstrates some mild peripheral spiculation, most compatible with primary bronchogenic neoplasm. Nodular area of scarring in the posterior aspect of the left lower lobe again noted. No acute consolidative airspace disease. No pleural effusions. Mild diffuse bronchial wall thickening with mild centrilobular and paraseptal emphysema.  Musculoskeletal/Soft Tissues: There are no aggressive appearing lytic or blastic lesions noted in the visualized portions of the skeleton.  CTA ABDOMEN AND PELVIS FINDINGS  Hepatobiliary: Liver has a slightly shrunken appearance and nodular contour, suggesting early changes of cirrhosis. No discrete cystic or solid hepatic lesions. No intra or extrahepatic biliary ductal dilatation. Tiny calcified gallstones lie dependently in the gallbladder. No findings to suggest an acute cholecystitis at this time.  Pancreas: No pancreatic mass. No pancreatic ductal dilatation. No pancreatic or peripancreatic fluid or inflammatory changes.  Spleen: Small 9 mm hypervascular lesion in the spleen, nonspecific, but likely a flash fill hemangioma.  Adrenals/Urinary Tract: Bilateral kidneys and bilateral adrenal glands are normal in appearance. No hydroureteronephrosis. Urinary bladder is normal in appearance.  Stomach/Bowel: Normal appearance of the stomach. No pathologic dilatation of small bowel or colon. Numerous  colonic diverticulae are noted, without surrounding inflammatory changes to suggest an acute diverticulitis at this time. Normal appendix.  Vascular/Lymphatic: Aortic atherosclerosis with vascular findings and measurements pertinent to potential TAVR procedure, as detailed below. Status post aorto bi-iliac bypass graft with widely patent limbs of the stent graft bilaterally. Notably, the stent graft is twisted shortly after the bifurcation such that the left limb of the graft extends to the right pelvic arteries and the right limb of the graft extends to the left. Celiac axis, superior mesenteric artery and inferior mesenteric artery are patent without definite hemodynamically significant stenosis. Single renal arteries bilaterally are widely patent. No lymphadenopathy noted in the abdomen or pelvis.  Reproductive: Prostate gland and seminal vesicles are unremarkable in appearance.  Other: No significant volume of ascites.  No pneumoperitoneum.  Musculoskeletal: Interbody cages at the L4-L5 interspace. There are no aggressive appearing lytic or blastic lesions noted in the visualized portions of the skeleton.  VASCULAR MEASUREMENTS PERTINENT TO TAVR:  AORTA:  Minimal Aortic Diameter -  2.0 x 1.8 mm  Severity of Aortic Calcification -  moderate  RIGHT PELVIS:  Right Common Iliac Artery -  Minimal Diameter - 9.6 x 7.0 mm  Tortuosity - mild  Calcification - moderate  Comment - Aorto bi-iliac stent graft which is widely patent.  Right External Iliac Artery -  Minimal Diameter - 5.7 x 5.7 mm  Tortuosity - moderate  Calcification - moderate  Right Common Femoral Artery -  Minimal Diameter - 5.2 x 5.9 mm  Tortuosity - mild  Calcification - moderate  LEFT PELVIS:  Left Common Iliac Artery -  Minimal Diameter - 9.4 x 7.8 mm  Tortuosity - mild  Calcification - moderate  Comment - Aorto bi-iliac stent graft which is widely  patent.  Left External Iliac Artery -  Minimal Diameter - 5.3 x 6.0 mm  Tortuosity - mild  Calcification - mild  Left Common Femoral Artery -  Minimal Diameter - 5.1 x 6.3 mm  Tortuosity - mild  Calcification - moderate  Review of the MIP images confirms the above findings.  IMPRESSION: 1. Vascular findings and measurements pertinent to potential TAVR procedure, as detailed above. This patient  may have suitable pelvic arterial access for low profile devices. 2. Severe thickening calcification of the aortic valve, compatible with the reported clinical history of severe aortic stenosis. 3. Cardiomegaly with biatrial dilatation. Notably, there is a filling defect in the tip of the left atrial appendage concerning for left atrial appendage thrombus. This places the patient at risk for systemic embolization, and could be confirmed with transesophageal echocardiography if clinically appropriate. 4. Aortic atherosclerosis, in addition to left main and 3 vessel coronary artery disease. Patient is also status post aortoiliac bypass graft which appears widely patent bilaterally. Please note the proximal twist of the limbs of the graft, as discussed above. 5. 2.3 x 1.4 x 1.6 cm macrolobulated slightly spiculated right upper lobe pulmonary nodule remains highly concerning for primary bronchogenic neoplasm. There multiple prominent but nonenlarged mediastinal and hilar lymph nodes which are nonspecific. No other signs of potential metastatic disease noted elsewhere in the chest, abdomen or pelvis. 6. Mild diffuse bronchial wall thickening with mild centrilobular and paraseptal emphysema; imaging findings suggestive of underlying COPD. 7. Cholelithiasis without evidence of acute cholecystitis at this time. 8. 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 9. Additional incidental findings, as above. These results will be called to the ordering clinician  or representative by the Radiologist Assistant, and communication documented in the PACS or zVision Dashboard.  Aortic Atherosclerosis (ICD10-I70.0) and Emphysema (ICD10-J43.9).   Electronically Signed   By: Vinnie Langton M.D.   On: 01/29/2017 13:58   RISK SCORES About the STS Risk Calculator Procedure: AV Replacement + CAB  Risk of Mortality: 3.625%  Morbidity or Mortality: 23.584%  Long Length of Stay: 12.596%  Short Length of Stay: 29.088%  Permanent Stroke: 1.731%  Prolonged Ventilation: 15.132%  DSW Infection: 0.476%  Renal Failure: 6.408%  Reoperation: 10.359%    Impression:  This 76 year old gentleman has stage D, severe, symptomatic aortic stenosis with NYHA class ll symptoms of exertional fatigue and shortness of breath consistent with chronic diastolic heart failure. I have personally reviewed and interpreted his echo, cath, and CTA studies. The aortic valve is trileaflet with severe thickening and calcification of the leaflets with reduced mobility. LVEF is normal. Cardiac cath shows a 60% proximal LAD stenosis and otherwise non-obstructive disease. He does report some exertional chest tightness but this lesion does not look tight enough to be causing much problem. I think AVR is indicated to improve his symptoms and prevent progressive LV deterioration. His risk for open surgical AVR would be at least moderately elevated due to his age and comorbid medical problems. His chest CT shows near circumferential calcified plaque in the ascending aorta and aortic arch which would significantly increase the risk of open surgery and would probably require replacement of the aortic root and ascending aorta. He also has a RUL lung nodule that is most likely a lung cancer and is amenable to surgical removal but an open AVR would delay that surgery for months. I think TAVR would be the best alternative for him. His gated cardiac CT shows anatomy suitable for a Sapien 3 valve and his  abdominal and pelvic CT shows suitable vasculature for transfemoral access although he does have significant calcified plaque in the common femoral arteries and a bifurcated stent graft in the abdominal aorta and common iliac arteries.  The patient was counseled at length regarding treatment alternatives for management of severe symptomatic aortic stenosis. The risks and benefits of surgical intervention has been discussed in detail. Long-term prognosis with  medical therapy was discussed. Alternative approaches such as conventional surgical aortic valve replacement, transcatheter aortic valve replacement, and palliative medical therapy were compared and contrasted at length. This discussion was placed in the context of the patient's own specific clinical presentation and past medical history. All of their questions been addressed. The patient is eager to proceed with TAVR.   Following the decision to proceed with transcatheter aortic valve replacement, a discussion was held regarding what types of management strategies would be attempted intraoperatively in the event of life-threatening complications, including whether or not the patient would be considered a candidate for the use of cardiopulmonary bypass and/or conversion to open sternotomy for attempted surgical intervention. The patient has been advised of a variety of complications that might develop including but not limited to risks of death, stroke, paravalvular leak, aortic dissection or other major vascular complications, aortic annulus rupture, device embolization, cardiac rupture or perforation, mitral regurgitation, acute myocardial infarction, arrhythmia, heart block or bradycardia requiring permanent pacemaker placement, congestive heart failure, respiratory failure, renal failure, pneumonia, infection, other late complications related to structural valve deterioration or migration, or other complications that might ultimately cause a temporary or  permanent loss of functional independence or other long term morbidity. The patient provides full informed consent for the procedure as described and all questions were answered.     Plan:  Transfemoral TAVR with a Sapien 3 valve on 02/24/2017.  I spent 60 minutes performing this consultation and > 50% of this time was spent face to face counseling and coordinating the care of this patient's severe aortic stenosis.    Gaye Pollack, MD 02/04/2017

## 2017-02-19 NOTE — Pre-Procedure Instructions (Signed)
[name] [name]  02/19/2017      [place], [place] [address] Phone: [phone] Fax: [phone]  [place], [place] [address] Phone: [phone] Fax: [phone]  [id number] - [place], [place] [address] Phone: [phone] Fax: [phone]    Your procedure is scheduled on Wednesday, February 24, 2017  Report to [facility] Admitting Entrance "A" at 8:20 A.M.   Call this number if you have problems the morning of surgery:  [phone]   Remember:  Do not eat food or drink liquids after midnight on February 23, 2017  Take these medicines the morning of surgery with A SIP OF WATER: AmLODipine (NORVASC) and Levothyroxine (SYNTHROID, LEVOTHROID).  Follow your doctor's instructions for taking Eliquis.  As of today, stop taking all Aspirins, Vitamins, Fish oils, and Herbal medications. Also stop all NSAIDS i.e. Advil, Motrin, Aleve, Anaprox, Naproxen, BC and Goody Powders.   Do not wear jewelry.  Do not wear lotions, powders, or perfumes, or deoderant.  Do not shave 48 hours prior to surgery.  Men may shave face and neck.  Do not bring valuables to the hospital.  [facility] is not responsible for any belongings or valuables.  Contacts, dentures or bridgework may not be worn into surgery.  Leave your suitcase in the car.  After surgery it may be brought to your room.  For patients admitted to the hospital, discharge time will be determined by your treatment team.  Patients discharged the day of surgery will not be allowed to drive home.   Special instructions:  [facility]- Preparing For Surgery  Before surgery, you can play an important role. Because skin is not sterile, your skin needs to be as free of germs as possible. You can reduce the number of germs on  your skin by washing with CHG (chlorahexidine gluconate) Soap before surgery.  CHG is an antiseptic cleaner which kills germs and bonds with the skin to continue killing germs even after washing.  Please do not use if you have an allergy to CHG or antibacterial soaps. If your skin becomes reddened/irritated stop using the CHG.  Do not shave (including legs and underarms) for at least 48 hours prior to first CHG shower. It is OK to shave your face.  Please follow these instructions carefully.   1. Shower the NIGHT BEFORE SURGERY and the MORNING OF SURGERY with CHG.   2. If you chose to wash your hair, wash your hair first as usual with your normal shampoo.  3. After you shampoo, rinse your hair and body thoroughly to remove the shampoo.  4. Use CHG as you would any other liquid soap. You can apply CHG directly to the skin and wash gently with a scrungie or a clean washcloth.   5. Apply the CHG Soap to your body ONLY FROM THE NECK DOWN.  Do not use on open wounds or open sores. Avoid contact with your eyes, ears, mouth and genitals (private parts). Wash genitals (private parts) with your normal soap.  6. Wash thoroughly, paying special attention to the area where your surgery will be performed.  7. Thoroughly rinse your body with warm water from the neck down.  8. DO NOT shower/wash with your normal soap after using and rinsing off the CHG Soap.  9.  Pat yourself dry with a CLEAN TOWEL.   10. Wear CLEAN PAJAMAS   11. Place CLEAN SHEETS on your bed the night of your first shower and DO NOT SLEEP WITH PETS.  Day of Surgery: Do not apply any deodorants/lotions. Please wear clean clothes to the hospital/surgery center.    Please read over the following fact sheets that you were given. Pain Booklet, Coughing and Deep Breathing, Blood Transfusion Information, MRSA Information and Surgical Site Infection Prevention

## 2017-02-20 ENCOUNTER — Encounter (HOSPITAL_COMMUNITY): Payer: Self-pay | Admitting: Physician Assistant

## 2017-02-20 ENCOUNTER — Encounter (HOSPITAL_COMMUNITY): Payer: Self-pay

## 2017-02-20 ENCOUNTER — Encounter (HOSPITAL_COMMUNITY)
Admission: RE | Admit: 2017-02-20 | Discharge: 2017-02-20 | Disposition: A | Discharge status: Home / Self Care | Payer: Medicare Other | Source: Ambulatory Visit | Attending: Cardiovascular Disease | Admitting: Cardiovascular Disease

## 2017-02-20 ENCOUNTER — Other Ambulatory Visit: Payer: Self-pay

## 2017-02-20 ENCOUNTER — Inpatient Hospital Stay (HOSPITAL_COMMUNITY)
Admission: EM | Admit: 2017-02-20 | Discharge: 2017-02-26 | DRG: 266 | Disposition: A | Discharge status: Home / Self Care | Payer: Medicare Other | Attending: Cardiovascular Disease | Admitting: Cardiovascular Disease

## 2017-02-20 DIAGNOSIS — N189 Chronic kidney disease, unspecified: Secondary | ICD-10-CM

## 2017-02-20 DIAGNOSIS — N179 Acute kidney failure, unspecified: Secondary | ICD-10-CM | POA: Diagnosis not present

## 2017-02-20 DIAGNOSIS — R918 Other nonspecific abnormal finding of lung field: Secondary | ICD-10-CM | POA: Diagnosis not present

## 2017-02-20 DIAGNOSIS — R8290 Unspecified abnormal findings in urine: Secondary | ICD-10-CM | POA: Diagnosis not present

## 2017-02-20 DIAGNOSIS — R932 Abnormal findings on diagnostic imaging of liver and biliary tract: Secondary | ICD-10-CM

## 2017-02-20 DIAGNOSIS — I959 Hypotension, unspecified: Secondary | ICD-10-CM | POA: Diagnosis not present

## 2017-02-20 DIAGNOSIS — K259 Gastric ulcer, unspecified as acute or chronic, without hemorrhage or perforation: Secondary | ICD-10-CM | POA: Diagnosis not present

## 2017-02-20 DIAGNOSIS — I340 Nonrheumatic mitral (valve) insufficiency: Secondary | ICD-10-CM | POA: Diagnosis not present

## 2017-02-20 DIAGNOSIS — N183 Chronic kidney disease, stage 3 (moderate): Secondary | ICD-10-CM | POA: Diagnosis not present

## 2017-02-20 DIAGNOSIS — I350 Nonrheumatic aortic (valve) stenosis: Secondary | ICD-10-CM | POA: Diagnosis not present

## 2017-02-20 DIAGNOSIS — K922 Gastrointestinal hemorrhage, unspecified: Secondary | ICD-10-CM

## 2017-02-20 DIAGNOSIS — K7460 Unspecified cirrhosis of liver: Secondary | ICD-10-CM | POA: Diagnosis present

## 2017-02-20 DIAGNOSIS — Z85828 Personal history of other malignant neoplasm of skin: Secondary | ICD-10-CM

## 2017-02-20 DIAGNOSIS — I739 Peripheral vascular disease, unspecified: Secondary | ICD-10-CM | POA: Diagnosis not present

## 2017-02-20 DIAGNOSIS — I130 Hypertensive heart and chronic kidney disease with heart failure and stage 1 through stage 4 chronic kidney disease, or unspecified chronic kidney disease: Secondary | ICD-10-CM | POA: Diagnosis not present

## 2017-02-20 DIAGNOSIS — B9681 Helicobacter pylori [H. pylori] as the cause of diseases classified elsewhere: Secondary | ICD-10-CM | POA: Diagnosis not present

## 2017-02-20 DIAGNOSIS — M25551 Pain in right hip: Secondary | ICD-10-CM

## 2017-02-20 DIAGNOSIS — E039 Hypothyroidism, unspecified: Secondary | ICD-10-CM | POA: Diagnosis present

## 2017-02-20 DIAGNOSIS — Z452 Encounter for adjustment and management of vascular access device: Secondary | ICD-10-CM | POA: Diagnosis not present

## 2017-02-20 DIAGNOSIS — E785 Hyperlipidemia, unspecified: Secondary | ICD-10-CM | POA: Diagnosis not present

## 2017-02-20 DIAGNOSIS — R911 Solitary pulmonary nodule: Secondary | ICD-10-CM | POA: Diagnosis present

## 2017-02-20 DIAGNOSIS — I2510 Atherosclerotic heart disease of native coronary artery without angina pectoris: Secondary | ICD-10-CM | POA: Diagnosis not present

## 2017-02-20 DIAGNOSIS — K2970 Gastritis, unspecified, without bleeding: Secondary | ICD-10-CM | POA: Diagnosis not present

## 2017-02-20 DIAGNOSIS — J449 Chronic obstructive pulmonary disease, unspecified: Secondary | ICD-10-CM | POA: Diagnosis not present

## 2017-02-20 DIAGNOSIS — R42 Dizziness and giddiness: Secondary | ICD-10-CM | POA: Diagnosis not present

## 2017-02-20 DIAGNOSIS — Z7901 Long term (current) use of anticoagulants: Secondary | ICD-10-CM | POA: Diagnosis not present

## 2017-02-20 DIAGNOSIS — E872 Acidosis: Secondary | ICD-10-CM | POA: Diagnosis present

## 2017-02-20 DIAGNOSIS — R778 Other specified abnormalities of plasma proteins: Secondary | ICD-10-CM | POA: Diagnosis present

## 2017-02-20 DIAGNOSIS — Z79899 Other long term (current) drug therapy: Secondary | ICD-10-CM

## 2017-02-20 DIAGNOSIS — I248 Other forms of acute ischemic heart disease: Secondary | ICD-10-CM | POA: Diagnosis not present

## 2017-02-20 DIAGNOSIS — R0902 Hypoxemia: Secondary | ICD-10-CM | POA: Diagnosis not present

## 2017-02-20 DIAGNOSIS — D649 Anemia, unspecified: Secondary | ICD-10-CM | POA: Diagnosis present

## 2017-02-20 DIAGNOSIS — I481 Persistent atrial fibrillation: Secondary | ICD-10-CM | POA: Diagnosis not present

## 2017-02-20 DIAGNOSIS — K921 Melena: Secondary | ICD-10-CM | POA: Diagnosis not present

## 2017-02-20 DIAGNOSIS — Z006 Encounter for examination for normal comparison and control in clinical research program: Secondary | ICD-10-CM

## 2017-02-20 DIAGNOSIS — A049 Bacterial intestinal infection, unspecified: Secondary | ICD-10-CM | POA: Diagnosis not present

## 2017-02-20 DIAGNOSIS — R7989 Other specified abnormal findings of blood chemistry: Secondary | ICD-10-CM | POA: Diagnosis present

## 2017-02-20 DIAGNOSIS — K254 Chronic or unspecified gastric ulcer with hemorrhage: Secondary | ICD-10-CM | POA: Diagnosis present

## 2017-02-20 DIAGNOSIS — C3490 Malignant neoplasm of unspecified part of unspecified bronchus or lung: Secondary | ICD-10-CM | POA: Diagnosis not present

## 2017-02-20 DIAGNOSIS — I5031 Acute diastolic (congestive) heart failure: Secondary | ICD-10-CM | POA: Diagnosis not present

## 2017-02-20 DIAGNOSIS — I70209 Unspecified atherosclerosis of native arteries of extremities, unspecified extremity: Secondary | ICD-10-CM | POA: Diagnosis present

## 2017-02-20 DIAGNOSIS — I517 Cardiomegaly: Secondary | ICD-10-CM | POA: Diagnosis not present

## 2017-02-20 DIAGNOSIS — Z953 Presence of xenogenic heart valve: Secondary | ICD-10-CM

## 2017-02-20 DIAGNOSIS — Z954 Presence of other heart-valve replacement: Secondary | ICD-10-CM | POA: Diagnosis not present

## 2017-02-20 DIAGNOSIS — R748 Abnormal levels of other serum enzymes: Secondary | ICD-10-CM | POA: Diagnosis not present

## 2017-02-20 DIAGNOSIS — I4891 Unspecified atrial fibrillation: Secondary | ICD-10-CM | POA: Diagnosis not present

## 2017-02-20 DIAGNOSIS — N400 Enlarged prostate without lower urinary tract symptoms: Secondary | ICD-10-CM | POA: Diagnosis not present

## 2017-02-20 DIAGNOSIS — I272 Pulmonary hypertension, unspecified: Secondary | ICD-10-CM | POA: Diagnosis not present

## 2017-02-20 DIAGNOSIS — E7800 Pure hypercholesterolemia, unspecified: Secondary | ICD-10-CM | POA: Diagnosis not present

## 2017-02-20 DIAGNOSIS — I480 Paroxysmal atrial fibrillation: Secondary | ICD-10-CM

## 2017-02-20 DIAGNOSIS — Z8249 Family history of ischemic heart disease and other diseases of the circulatory system: Secondary | ICD-10-CM

## 2017-02-20 DIAGNOSIS — K649 Unspecified hemorrhoids: Secondary | ICD-10-CM | POA: Diagnosis not present

## 2017-02-20 DIAGNOSIS — I083 Combined rheumatic disorders of mitral, aortic and tricuspid valves: Secondary | ICD-10-CM | POA: Diagnosis not present

## 2017-02-20 DIAGNOSIS — D62 Acute posthemorrhagic anemia: Secondary | ICD-10-CM | POA: Diagnosis not present

## 2017-02-20 DIAGNOSIS — R531 Weakness: Secondary | ICD-10-CM | POA: Diagnosis not present

## 2017-02-20 DIAGNOSIS — E559 Vitamin D deficiency, unspecified: Secondary | ICD-10-CM | POA: Diagnosis present

## 2017-02-20 DIAGNOSIS — K2950 Unspecified chronic gastritis without bleeding: Secondary | ICD-10-CM | POA: Diagnosis not present

## 2017-02-20 DIAGNOSIS — Z87891 Personal history of nicotine dependence: Secondary | ICD-10-CM

## 2017-02-20 DIAGNOSIS — Z8551 Personal history of malignant neoplasm of bladder: Secondary | ICD-10-CM

## 2017-02-20 HISTORY — DX: Other persistent atrial fibrillation: I48.19

## 2017-02-20 HISTORY — DX: Nonrheumatic aortic (valve) stenosis: I35.0

## 2017-02-20 HISTORY — DX: Hypothyroidism, unspecified: E03.9

## 2017-02-20 LAB — PROTIME-INR
INR: 1.53
PROTHROMBIN TIME: 18.3 s — AB (ref 11.4–15.2)

## 2017-02-20 LAB — BPAM RBC
BLOOD PRODUCT EXPIRATION DATE: 201809192359
Blood Product Expiration Date: 201809192359
ISSUE DATE / TIME: 201808290935
ISSUE DATE / TIME: 201808290935
Unit Type and Rh: 6200
Unit Type and Rh: 6200

## 2017-02-20 LAB — TYPE AND SCREEN
ABO/RH(D): A POS
ANTIBODY SCREEN: NEGATIVE
UNIT DIVISION: 0
UNIT DIVISION: 0

## 2017-02-20 LAB — CBC
HCT: 19.9 % — ABNORMAL LOW (ref 39.0–52.0)
Hemoglobin: 5.9 g/dL — CL (ref 13.0–17.0)
MCH: 23.3 pg — ABNORMAL LOW (ref 26.0–34.0)
MCHC: 29.6 g/dL — AB (ref 30.0–36.0)
MCV: 78.7 fL (ref 78.0–100.0)
PLATELETS: 178 10*3/uL (ref 150–400)
RBC: 2.53 MIL/uL — ABNORMAL LOW (ref 4.22–5.81)
RDW: 19.7 % — ABNORMAL HIGH (ref 11.5–15.5)
WBC: 5.2 10*3/uL (ref 4.0–10.5)

## 2017-02-20 LAB — URINALYSIS, ROUTINE W REFLEX MICROSCOPIC
Bilirubin Urine: NEGATIVE
GLUCOSE, UA: NEGATIVE mg/dL
Hgb urine dipstick: NEGATIVE
Ketones, ur: NEGATIVE mg/dL
Leukocytes, UA: NEGATIVE
NITRITE: NEGATIVE
PH: 5 (ref 5.0–8.0)
Protein, ur: 30 mg/dL — AB
Specific Gravity, Urine: 1.019 (ref 1.005–1.030)
Squamous Epithelial / LPF: NONE SEEN

## 2017-02-20 LAB — BLOOD GAS, ARTERIAL
ACID-BASE DEFICIT: 4.8 mmol/L — AB (ref 0.0–2.0)
Bicarbonate: 18.5 mmol/L — ABNORMAL LOW (ref 20.0–28.0)
DRAWN BY: 42180
O2 SAT: 97.9 %
PATIENT TEMPERATURE: 98.6
pCO2 arterial: 26.9 mmHg — ABNORMAL LOW (ref 32.0–48.0)
pH, Arterial: 7.452 — ABNORMAL HIGH (ref 7.350–7.450)
pO2, Arterial: 107 mmHg (ref 83.0–108.0)

## 2017-02-20 LAB — HEMOGLOBIN A1C
Hgb A1c MFr Bld: 5.4 % (ref 4.8–5.6)
MEAN PLASMA GLUCOSE: 108.28 mg/dL

## 2017-02-20 LAB — SURGICAL PCR SCREEN
MRSA, PCR: NEGATIVE
Staphylococcus aureus: NEGATIVE

## 2017-02-20 LAB — COMPREHENSIVE METABOLIC PANEL
ALBUMIN: 3.7 g/dL (ref 3.5–5.0)
ALT: 50 U/L (ref 17–63)
AST: 62 U/L — ABNORMAL HIGH (ref 15–41)
Alkaline Phosphatase: 74 U/L (ref 38–126)
Anion gap: 12 (ref 5–15)
BUN: 30 mg/dL — ABNORMAL HIGH (ref 6–20)
CHLORIDE: 105 mmol/L (ref 101–111)
CO2: 17 mmol/L — AB (ref 22–32)
CREATININE: 1.51 mg/dL — AB (ref 0.61–1.24)
Calcium: 8.9 mg/dL (ref 8.9–10.3)
GFR calc non Af Amer: 43 mL/min — ABNORMAL LOW (ref >60)
GFR, EST AFRICAN AMERICAN: 50 mL/min — AB (ref >60)
GLUCOSE: 135 mg/dL — AB (ref 65–99)
Potassium: 4.2 mmol/L (ref 3.5–5.1)
SODIUM: 134 mmol/L — AB (ref 135–145)
Total Bilirubin: 1.4 mg/dL — ABNORMAL HIGH (ref 0.3–1.2)
Total Protein: 7.1 g/dL (ref 6.5–8.1)

## 2017-02-20 LAB — TROPONIN I: TROPONIN I: 0.24 ng/mL — AB (ref <0.03)

## 2017-02-20 LAB — APTT: APTT: 35 s (ref 24–36)

## 2017-02-20 LAB — PREPARE RBC (CROSSMATCH)

## 2017-02-20 MED ORDER — ONDANSETRON HCL 4 MG PO TABS
4.0000 mg | ORAL_TABLET | Freq: Four times a day (QID) | ORAL | Status: DC | PRN
  Administered 2017-02-25: 4 mg via ORAL
  Filled 2017-02-20 (×2): qty 1

## 2017-02-20 MED ORDER — SODIUM CHLORIDE 0.9 % IV SOLN
INTRAVENOUS | Status: DC
  Administered 2017-02-20 (×2): via INTRAVENOUS

## 2017-02-20 MED ORDER — PANTOPRAZOLE SODIUM 40 MG PO TBEC
40.0000 mg | DELAYED_RELEASE_TABLET | Freq: Two times a day (BID) | ORAL | Status: DC
  Administered 2017-02-20 – 2017-02-26 (×11): 40 mg via ORAL
  Filled 2017-02-20 (×11): qty 1

## 2017-02-20 MED ORDER — PANTOPRAZOLE SODIUM 40 MG IV SOLR: 40.0000 mg | Freq: Two times a day (BID) | INTRAVENOUS | Status: DC

## 2017-02-20 MED ORDER — LEVOTHYROXINE SODIUM 100 MCG IV SOLR
37.5000 ug | Freq: Every day | INTRAVENOUS | Status: DC
  Administered 2017-02-20 – 2017-02-23 (×4): 37.5 ug via INTRAVENOUS
  Filled 2017-02-20 (×4): qty 5

## 2017-02-20 MED ORDER — SODIUM CHLORIDE 0.9 % IV SOLN
10.0000 mL/h | Freq: Once | INTRAVENOUS | Status: AC
  Administered 2017-02-20: 10 mL/h via INTRAVENOUS

## 2017-02-20 MED ORDER — FUROSEMIDE 10 MG/ML IJ SOLN
20.0000 mg | Freq: Once | INTRAMUSCULAR | Status: AC
  Administered 2017-02-20: 20 mg via INTRAVENOUS
  Filled 2017-02-20: qty 2

## 2017-02-20 MED ORDER — SODIUM CHLORIDE 0.9% FLUSH
3.0000 mL | Freq: Two times a day (BID) | INTRAVENOUS | Status: DC
  Administered 2017-02-20 – 2017-02-24 (×9): 3 mL via INTRAVENOUS

## 2017-02-20 MED ORDER — ONDANSETRON HCL 4 MG/2ML IJ SOLN: 4.0000 mg | Freq: Four times a day (QID) | INTRAMUSCULAR | Status: DC | PRN

## 2017-02-20 MED ORDER — CYCLOBENZAPRINE HCL 10 MG PO TABS
5.0000 mg | ORAL_TABLET | Freq: Three times a day (TID) | ORAL | Status: DC | PRN
Start: 2017-02-20 — End: 2017-02-26
  Administered 2017-02-20 – 2017-02-25 (×6): 5 mg via ORAL
  Filled 2017-02-20 (×6): qty 1

## 2017-02-20 MED ORDER — DEXTROSE 5 % IV SOLN
1.0000 g | INTRAVENOUS | Status: DC
  Administered 2017-02-20 – 2017-02-23 (×4): 1 g via INTRAVENOUS
  Filled 2017-02-20 (×5): qty 10

## 2017-02-20 MED ORDER — FUROSEMIDE 10 MG/ML IJ SOLN
40.0000 mg | Freq: Once | INTRAMUSCULAR | Status: AC
  Administered 2017-02-20: 40 mg via INTRAVENOUS
  Filled 2017-02-20: qty 4

## 2017-02-20 MED ORDER — ACETAMINOPHEN 325 MG PO TABS
650.0000 mg | ORAL_TABLET | Freq: Four times a day (QID) | ORAL | Status: DC | PRN
Start: 2017-02-20 — End: 2017-02-26
  Administered 2017-02-22 (×2): 650 mg via ORAL
  Filled 2017-02-20 (×3): qty 2

## 2017-02-20 MED ORDER — ACETAMINOPHEN 650 MG RE SUPP
650.0000 mg | Freq: Four times a day (QID) | RECTAL | Status: DC | PRN
  Administered 2017-02-21: 650 mg via RECTAL
  Filled 2017-02-20: qty 1

## 2017-02-20 NOTE — Progress Notes (Signed)
While pt was having his blood drawn and EKG performed, pt began to have mild labored breathing. PA Ebony Hail with anesthesia made aware, and is now evaluating the pt. Pt sts he has been getting increasingly SOB when walking short and long distances, and thinks he may need to be on O2 at home. Current O2 sat 95% on RA.

## 2017-02-20 NOTE — ED Notes (Signed)
CRITICAL VALUE ALERT  Critical Value:  Troponin 0.24  Date & Time Notied: 02/20/2017 1330  Provider Notified: Dr. Zenia Resides

## 2017-02-20 NOTE — H&P (Signed)
History and Physical    [name] [name] OEV:[id number] DOB: April 24, 1941 DOA: 02/20/2017   PCP: [name], [place]   Attending physician: Aggie Moats  Patient coming from/Resides with: Private residence/wife  Chief Complaint: Worsening dyspnea on exertion, low hemoglobin  HPI: [name] [name] is a 76 y.o. male with medical history significant for hypertension, dyslipidemia, known severe aortic stenosis with TAVR procedure pending on 9/4, lesion of right lung suspicious for possible malignancy, BPH, new diagnosis of atrial fibrillation recently started on eliquis and mild stage III chronic kidney disease. He reports chronic dyspnea on exertion but for the past week this has been worse than baseline. He has had dark stools for 2 weeks. He reports 2 days after starting eliquis he developed indigestion. He reported this recently to the cardiologist who has took him off eliquis 2 days ago. He presented to this facility for preoperative lab work and was found to have a hemoglobin of 5.9 so he was sent to the ER for further evaluation and treatment. He was also found to have a mild acute kidney injury with BUN 30 creatinine 1.51 with a mild non-and an gap acidosis. In the ER his blood pressure readings have been suboptimal mainly in the 82-993 systolic range but during my examination patient is blood pressure had dropped to 85. He has been evaluated by cardiology and GI with recommendations made. 2 units packed red blood cells are pending.  ED Course:  Vital Signs: BP 96/60   Pulse 98   Temp 98.3 F (36.8 C) (Oral)   Resp 19   Ht 5\' 10"  (1.778 m)   Wt 78.9 kg (174 lb)   SpO2 100%   BMI 24.97 kg/m  2 view CXR: Persistent right upper lobe mass without interim change Lab data: Sodium 134, potassium 4.2, chloride 105, CO2 17, glucose 135, BUN 30, gravity 1.51, AST 62, total bilirubin 1.4, troponin 0.24, white count 5200 differential not obtained, hemoglobin 5.9, hematocrit 19.9, platelets  178,000, PT 18.3, INR 1.53, PTT 35, HgbA1c 5.4, urinalysis unremarkable with hazy appearance, amber color, protein 30, rare bacteria WBC 6-30 Medications and treatments: First unit of packed red blood cells infusing, normal saline at 125/hr  Review of Systems:  In addition to the HPI above,  No Fever-chills, myalgias or other constitutional symptoms No Headache, changes with Vision or hearing, new weakness, tingling, numbness in any extremity, dizziness, dysarthria or word finding difficulty, gait disturbance or imbalance, tremors or seizure activity No problems swallowing food or Liquids, indigestion/reflux, choking or coughing while eating, abdominal pain with or after eating No Chest pain, Cough, palpitations, orthopnea  No Abdominal pain, N/V, melena,hematochezia, constipation No dysuria, malodorous urine, hematuria or flank pain No new skin rashes, lesions, masses or bruises, No new joint pains, aches, swelling or redness No recent unintentional weight gain or loss No polyuria, polydypsia or polyphagia   Past Medical History:  Diagnosis Date  . AAA (abdominal aortic aneurysm) ([place])   . Aortic stenosis, severe   . Arthritis   . Atherosclerotic peripheral vascular disease ([place])   . Atrial fibrillation, persistent ([place])   . Bilateral carotid artery stenosis   . COPD (chronic obstructive pulmonary disease) ([place])   . Hyperlipidemia   . Hypertension   . Joint pain   . Skin cancer   . Thyroid disease    hypothyroid  . Vitamin D deficiency     Past Surgical History:  Procedure Laterality Date  . ABDOMINAL AORTIC ANEURYSM REPAIR  08-09-09  . BACK SURGERY    .  HYDROCELE EXCISION / REPAIR    . SKIN CANCER EXCISION    . SPINE SURGERY    . TRANSURETHRAL RESECTION OF BLADDER TUMOR N/A 06/06/2016   Procedure: TRANSURETHRAL RESECTION OF BLADDER TUMOR (TURBT);  Surgeon: Franchot Gallo, MD;  Location: [facility];  Service: Urology;  Laterality: N/A;  . TRANSURETHRAL RESECTION OF BLADDER  TUMOR N/A 07/22/2016   Procedure: TRANSURETHRAL RESECTION OF BLADDER TUMOR (TURBT);  Surgeon: Franchot Gallo, MD;  Location: [facility];  Service: Urology;  Laterality: N/A;    Social History   Social History  . Marital status: Married    Spouse name: N/A  . Number of children: N/A  . Years of education: N/A   Occupational History  . 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 Smoking status: Former Smoker    Packs/day: 1.00    Years: 51.00    Types: Cigarettes    Start date: 11/25/1954    Quit date: 10/27/2005  . Smokeless tobacco: Never Used  . Alcohol use No     Comment: none since 1999  . Drug use: No  . Sexual activity: Yes   Other Topics Concern  . Not on file   Social History Narrative  . No narrative on file    Mobility: Utilizes a cane or rolling walker when having back pain issues Work history: Not obtained   No Known Allergies  Family History  Problem Relation Age of Onset  . Heart disease Father        Heart Disease before age 46  . Alcohol abuse Father   . Heart attack Father   . Cancer Mother        Lung  . COPD Mother   . COPD Sister   . Lupus Sister   . Hypertension Brother   . Gout Brother   . Heart attack Son   . Gout Brother   . Prostatitis Brother      Prior to Admission medications   Medication Sig Start Date End Date Taking? Authorizing Provider  alfuzosin (UROXATRAL) 10 MG 24 hr tablet Take 10 mg by mouth at bedtime.     [provider]  amLODipine (NORVASC) 5 MG tablet Take 1 tablet (5 mg total) by mouth daily. 10/20/16   Hassell Done, [name], FNP  apixaban (ELIQUIS) 5 MG TABS tablet Take 1 tablet (5 mg total) by mouth 2 (two) times daily. 02/04/17   Gaye Pollack, MD  atorvastatin (LIPITOR) 40 MG tablet Take 1 tablet (40 mg total) by mouth daily. 10/20/16   Hassell Done [name], FNP  cholecalciferol (VITAMIN D) 1000 UNITS tablet Take 1 tablet (1,000 Units total) by mouth daily. 12/11/14    Hassell Done, [name], FNP  ibuprofen (ADVIL,MOTRIN) 200 MG tablet Take 400 mg by mouth every 6 (six) hours as needed for headache or mild pain.    [provider]  levothyroxine (SYNTHROID, LEVOTHROID) 75 MCG tablet Take 1 tablet (75 mcg total) by mouth daily. 10/20/16   Hassell Done, [name], FNP  lisinopril (PRINIVIL,ZESTRIL) 40 MG tablet Take 1 tablet (40 mg total) by mouth daily. 10/20/16   Hassell Done [name], FNP  Multiple Vitamin (ONE-A-DAY MENS PO) Take 1 tablet by mouth daily.      [provider]  Omega-3 Fatty Acids (FISH OIL) 1200 MG CAPS Take 2,400 mg by mouth daily.     [provider]    Physical Exam: Vitals:   02/20/17 1430 02/20/17 1445 02/20/17 1500 02/20/17 1515  BP:  100/67 96/60 (!) 98/58 90/67  Pulse: 86 87 93 99  Resp: 17 18 18 18   Temp:  98.3 F (36.8 C)  98.4 F (36.9 C)  TempSrc:  Oral  Oral  SpO2: 99% 100% 100% 100%  Weight:      Height:          Constitutional: NAD, calm, comfortable-Generalized pale appearance Eyes: PERRL, lids normal,Conjunctiva are pale ENMT: Mucous membranes are moist. Posterior pharynx clear of any exudate or lesions.Normal dentition.  Neck: normal, supple, no masses, no thyromegaly Respiratory: clear to auscultation bilaterally, no wheezing, no crackles. Decreased in bases bilaterally. Normal respiratory effort. No accessory muscle use.  Cardiovascular: Regular rate and rhythm, distant systolic murmur right sternal border second intercostal space, no rubs / gallops. No extremity edema. 2+ pedal pulses. No carotid bruits.  Abdomen: no tenderness, no masses palpated. No hepatosplenomegaly. Bowel sounds positive.  Musculoskeletal: no clubbing / cyanosis. No joint deformity upper and lower extremities. Good ROM, no contractures. Normal muscle tone.  Skin: no rashes, lesions, ulcers. No induration Neurologic: CN 2-12 grossly intact. Sensation intact, DTR normal. Strength 5/5 x all 4 extremities.  Psychiatric:  Normal judgment and insight. Alert and oriented x 3. Normal mood.    Labs on Admission: I have personally reviewed following labs and imaging studies  CBC:  Recent Labs Lab 02/20/17 1021  WBC 5.2  HGB 5.9*  HCT 19.9*  MCV 78.7  PLT 119   Basic Metabolic Panel:  Recent Labs Lab 02/20/17 1021  NA 134*  K 4.2  CL 105  CO2 17*  GLUCOSE 135*  BUN 30*  CREATININE 1.51*  CALCIUM 8.9   GFR: Estimated Creatinine Clearance: 42.3 mL/min (A) (by C-G formula based on SCr of 1.51 mg/dL (H)). Liver Function Tests:  Recent Labs Lab 02/20/17 1021  AST 62*  ALT 50  ALKPHOS 74  BILITOT 1.4*  PROT 7.1  ALBUMIN 3.7   No results for input(s): LIPASE, AMYLASE in the last 168 hours. No results for input(s): AMMONIA in the last 168 hours. Coagulation Profile:  Recent Labs Lab 02/20/17 1021  INR 1.53   Cardiac Enzymes:  Recent Labs Lab 02/20/17 1223  TROPONINI 0.24*   BNP (last 3 results) No results for input(s): PROBNP in the last 8760 hours. HbA1C:  Recent Labs  02/20/17 1022  HGBA1C 5.4   CBG: No results for input(s): GLUCAP in the last 168 hours. Lipid Profile: No results for input(s): CHOL, HDL, LDLCALC, TRIG, CHOLHDL, LDLDIRECT in the last 72 hours. Thyroid Function Tests: No results for input(s): TSH, T4TOTAL, FREET4, T3FREE, THYROIDAB in the last 72 hours. Anemia Panel: No results for input(s): VITAMINB12, FOLATE, FERRITIN, TIBC, IRON, RETICCTPCT in the last 72 hours. Urine analysis:    Component Value Date/Time   COLORURINE AMBER (A) 02/20/2017 1022   APPEARANCEUR HAZY (A) 02/20/2017 1022   APPEARANCEUR Cloudy (A) 02/27/2016 1758   LABSPEC 1.019 02/20/2017 1022   PHURINE 5.0 02/20/2017 1022   GLUCOSEU NEGATIVE 02/20/2017 1022   HGBUR NEGATIVE 02/20/2017 1022   BILIRUBINUR NEGATIVE 02/20/2017 1022   BILIRUBINUR Negative 02/27/2016 1758   KETONESUR NEGATIVE 02/20/2017 1022   PROTEINUR 30 (A) 02/20/2017 1022   UROBILINOGEN 0.2 08/07/2009 0953    NITRITE NEGATIVE 02/20/2017 1022   LEUKOCYTESUR NEGATIVE 02/20/2017 1022   LEUKOCYTESUR Negative 02/27/2016 1758   Sepsis Labs: @LABRCNTIP (procalcitonin:4,lacticidven:4) ) Recent Results (from the past 240 hour(s))  Surgical pcr screen     Status: None   Collection Time: 02/20/17 10:21 AM  Result  Value Ref Range Status   MRSA, PCR NEGATIVE NEGATIVE Final   Staphylococcus aureus NEGATIVE NEGATIVE Final    Comment: (NOTE) The Xpert SA Assay (FDA approved for NASAL specimens in patients 56 years of age and older), is one component of a comprehensive surveillance program. It is not intended to diagnose infection nor to guide or monitor treatment.      Radiological Exams on Admission: Dg Chest 2 View  Result Date: 02/20/2017 CLINICAL DATA:  Aortic valve repair. EXAM: CHEST  2 VIEW COMPARISON:  CT 01/29/2017.  PET-CT 12/02/2016. FINDINGS: Mediastinum and hilar structures are normal. Cardiomegaly with normal pulmonary vascularity. Persistent right upper lobe mass. No pleural effusion or pneumothorax. IMPRESSION: Persistent right upper lobe mass. No interim change from prior studies. Electronically Signed   By: Marcello Moores  Register   On: 02/20/2017 13:33    EKG: (Independently reviewed) Atrial fibrillation with unifocal PVC, ventricular rate 80 bpm, QTC 438 ms, downsloping ST segments and septal lateral leads unchanged from previous EKG.  Assessment/Plan Principal Problem:   Symptomatic anemia -Presents with worsened from baseline dyspnea on exertion 1 week and was found to have significant anemia with a hemoglobin of 5.9 with baseline hemoglobin 9.22 December 2016 -Patient also with suboptimal blood pressure readings and during my evaluation I repeated blood pressure and he was hypotensive with a systolic blood pressure of 85 -Agree with 2 units PRBCs-obtain CBC after second unit and likely will require additional transfusions over the next 24-36 hours -Suspect GI source given dark stools  reported although unable to obtain stool for DRE in ER according to GI provider. -BUN elevated and patient reporting nausea after beginning eliquis so likely upper GI in etiology -Protonix 40 mg IV every 12 hours -NPO for now-GI may allow clear liquids later tonight -CBC in a.m. -Appreciate GI assistance -Patient denies recent end-stage usage although does have ibprofen ordered on his  Active Problems:   Elevated troponin  -Likely demand ischemia from hypoxia and hypotension -Cycle troponin -No chest pain reported and EKG stable    Atrial fibrillation  -Patient reports newly diagnosed August 15 with subsequent initiation of eliquis -Currently rate controlled not on AVN blocking agents or antiarrhythmics -Hold anticoagulation/eliquis insetting of symptomatic anemia and suspected GI bleeding -Appreciate cardiology assistance    Severe aortic stenosis -Has chronic dyspnea on exertion -TAVR tentatively planned for 9/4    Lesion of right lung -Suspicious for bronchogenic malignancy -More aggressive evaluation/workup after recovers from TAVR    Acute kidney injury superimposed on chronic kidney disease  -Baseline renal function 17/1.2 -Current renal function 30/1.51 -Subtle changes and with elevated BUN this may be more related to upper GI bleeding as opposed to acute kidney injury but we'll continue to follow    Cirrhosis  -Incidental finding early cirrhosis on CT scan August 2018 -GI has ordered serology/autoimmune markers    Abnormal urinalysis -Concerning for possible UTI -Urine culture -Empiric Rocephin    Hyperlipidemia -Hold preadmission Lipitor and omega-3 fatty acids    Hypertension -Current blood pressure reading suboptimal so holding preadmission Norvasc and lisinopril    BPH (benign prostatic hyperplasia) -Hold Uroxatral until diet allowed    Hypothyroidism -Continue Synthroid but convert to IV   DVT prophylaxis: SCDs Code Status: Full  Family  Communication: Wife Disposition Plan: Home Consults called: GI/ Financial controller; Cardiology/[facility] on-call physician    Samella Parr ANP-BC [facility] Pager [phone]   If 7PM-7AM, please contact night-coverage [email or web address] Password [facility]  02/20/2017, 3:19 PM

## 2017-02-20 NOTE — ED Triage Notes (Signed)
Patient arrived from pre op apt reporting low hemoglobin and ekg changes noted by staff. Patient states he experiences SOB with any exertion. Denies any pain, Pt discontinued eliques 2 days ago. Pt report dark tarry stools after MRI approximately 2 weeks.

## 2017-02-20 NOTE — Progress Notes (Signed)
PA Ebony Hail with anesthesia made aware of pt's Hgb 5.9.

## 2017-02-20 NOTE — Progress Notes (Addendum)
[facility]  Cardiologist - Dr. Kate Sable Dr. Barrett Henle  Pulm- Dr. Kara Mead  Chest x-ray - 02/20/17  EKG - 02/20/17  Stress Test -  More than 5 years  ECHO - 10/27/16 (E)  Cardiac Cath - 01/14/17 (E)  PFT- 12/09/16 (E)  Doppler- 07/21/14- Carotid (E)  Sleep Study - Denies CPAP - Denies  Chart will be given for review due to pt's cardiac history.  Pt denies having chest pain, sob, or fever at this time. Pt sts his blood pressure is low due to the new medications that he has started since 8/15. Pt sts he stopped taking the Norvasc and Lisinopril for that last few days, and his blood pressure went back to normal, but he did take them both today without checking his blood pressure.  Pt has stopped his Eliquis since 02/18/17, as ordered by his doctor. All instructions explained to the pt, with a verbal understanding of the material. Pt agrees to go over the instructions while at home for a better understanding. The opportunity to ask questions was provided.

## 2017-02-20 NOTE — ED Provider Notes (Addendum)
[facility] DEPT Provider Note   CSN: [id number] Arrival date & time: 02/20/17  1142     History   Chief Complaint Chief Complaint  Patient presents with  . Abnormal Lab  . Abnormal ECG    HPI [name] [name] is a 76 y.o. male.  76 year old male with history of A. fib on Eilquis presents with weakness and dizziness times several days. Patient is also noted dark tarry stools but denies any hematemesis. No abdominal discomfort. Was in preop clearance today when her hemoglobin was done it was 5.9. Is also noted to have some EKG changes consisting of ST depression anterior lateral. He denies any chest pain or chest pressure. States that he has had some increasing dyspnea and exertion without cough or congestion. No lower extremity edema. Was sent to for further evaluation.      Past Medical History:  Diagnosis Date  . AAA (abdominal aortic aneurysm) ([place])   . AAA (abdominal aortic aneurysm) ([place])   . Aortic stenosis   . Arthritis   . Atherosclerotic peripheral vascular disease ([place])   . Bilateral carotid artery stenosis   . Cancer [place]) 2006 and Nov. 9, 2013    BCC, right shoulder, lung  . COPD (chronic obstructive pulmonary disease) ([place])   . Hyperlipidemia   . Hypertension   . Joint pain   . Skin cancer   . Thyroid disease    hypothyroid  . Vitamin D deficiency   . Weight gain     Patient Active Problem List   Diagnosis Date Noted  . COPD (chronic obstructive pulmonary disease) ([place]) 12/09/2016  . Solitary pulmonary nodule present on computed tomography of lung 11/21/2016  . Aortic stenosis 12/11/2014  . Vitamin D deficiency   . BMI 26.0-26.9,adult 05/18/2013  . Fatty liver 02/24/2013  . Gallbladder polyp 02/24/2013  . HLD (hyperlipidemia) 01/14/2013  . HTN (hypertension) 01/14/2013  . Hypothyroidism 01/14/2013  . S/P AAA repair 05/31/2012  . Carotid stenosis 05/31/2012    Past Surgical History:  Procedure Laterality Date  . ABDOMINAL AORTIC  ANEURYSM REPAIR  08-09-09  . BACK SURGERY    . HYDROCELE EXCISION / REPAIR    . SKIN CANCER EXCISION    . SPINE SURGERY    . TRANSURETHRAL RESECTION OF BLADDER TUMOR N/A 06/06/2016   Procedure: TRANSURETHRAL RESECTION OF BLADDER TUMOR (TURBT);  Surgeon: Franchot Gallo, MD;  Location: [facility];  Service: Urology;  Laterality: N/A;  . TRANSURETHRAL RESECTION OF BLADDER TUMOR N/A 07/22/2016   Procedure: TRANSURETHRAL RESECTION OF BLADDER TUMOR (TURBT);  Surgeon: Franchot Gallo, MD;  Location: [facility];  Service: Urology;  Laterality: N/A;       Home Medications    Prior to Admission medications   Medication Sig Start Date End Date Taking? Authorizing Provider  alfuzosin (UROXATRAL) 10 MG 24 hr tablet Take 10 mg by mouth at bedtime.     [provider]  amLODipine (NORVASC) 5 MG tablet Take 1 tablet (5 mg total) by mouth daily. 10/20/16   Hassell Done, [name], FNP  apixaban (ELIQUIS) 5 MG TABS tablet Take 1 tablet (5 mg total) by mouth 2 (two) times daily. 02/04/17   Gaye Pollack, MD  atorvastatin (LIPITOR) 40 MG tablet Take 1 tablet (40 mg total) by mouth daily. 10/20/16   Hassell Done [name], FNP  cholecalciferol (VITAMIN D) 1000 UNITS tablet Take 1 tablet (1,000 Units total) by mouth daily. 12/11/14   Hassell Done, [name], FNP  ibuprofen (ADVIL,MOTRIN) 200 MG tablet Take 400 mg by mouth every  6 (six) hours as needed for headache or mild pain.    [provider]  levothyroxine (SYNTHROID, LEVOTHROID) 75 MCG tablet Take 1 tablet (75 mcg total) by mouth daily. 10/20/16   Hassell Done, [name], FNP  lisinopril (PRINIVIL,ZESTRIL) 40 MG tablet Take 1 tablet (40 mg total) by mouth daily. 10/20/16   Hassell Done [name], FNP  Multiple Vitamin (ONE-A-DAY MENS PO) Take 1 tablet by mouth daily.      [provider]  Omega-3 Fatty Acids (FISH OIL) 1200 MG CAPS Take 2,400 mg by mouth daily.     [provider]    Family History Family History  Problem Relation  Age of Onset  . Heart disease Father        Heart Disease before age 97  . Alcohol abuse Father   . Heart attack Father   . Cancer Mother        Lung  . COPD Mother   . COPD Sister   . Lupus Sister   . Hypertension Brother   . Gout Brother   . Heart attack Son   . Gout Brother   . Prostatitis Brother     Social History Social History  Substance Use Topics  . Smoking status: Former Smoker    Packs/day: 1.00    Years: 51.00    Types: Cigarettes    Start date: 11/25/1954    Quit date: 10/27/2005  . Smokeless tobacco: Never Used  . Alcohol use No     Comment: none since 1999     Allergies   Patient has no known allergies.   Review of Systems Review of Systems  All other systems reviewed and are negative.    Physical Exam Updated Vital Signs BP 96/63 (BP Location: Right Arm)   Pulse 88   Temp (!) 97.5 F (36.4 C) (Oral)   Resp 12   Ht 1.778 m (5\' 10" )   Wt 78.9 kg (174 lb)   SpO2 100%   BMI 24.97 kg/m   Physical Exam  Constitutional: He is oriented to person, place, and time. He appears well-developed and well-nourished.  Non-toxic appearance. No distress.  HENT:  Head: Normocephalic and atraumatic.  Eyes: Pupils are equal, round, and reactive to light. Conjunctivae, EOM and lids are normal.  Pale conjunctiva bilateral  Neck: Normal range of motion. Neck supple. No tracheal deviation present. No thyroid mass present.  Cardiovascular: Normal rate and normal heart sounds.  An irregular rhythm present. Exam reveals no gallop.   No murmur heard. Pulmonary/Chest: Effort normal and breath sounds normal. No stridor. No respiratory distress. He has no decreased breath sounds. He has no wheezes. He has no rhonchi. He has no rales.  Abdominal: Soft. Normal appearance and bowel sounds are normal. He exhibits no distension. There is no tenderness. There is no rigidity, no rebound, no guarding and no CVA tenderness.  Musculoskeletal: Normal range of motion. He exhibits no  edema or tenderness.  Neurological: He is alert and oriented to person, place, and time. He has normal strength. No cranial nerve deficit or sensory deficit. GCS eye subscore is 4. GCS verbal subscore is 5. GCS motor subscore is 6.  Skin: Skin is warm and dry. No abrasion and no rash noted.  Psychiatric: He has a normal mood and affect. His speech is normal and behavior is normal.  Nursing note and vitals reviewed.    ED Treatments / Results  Labs (all labs ordered are listed, but only abnormal results are displayed) Labs Reviewed  POC OCCULT BLOOD, ED  PREPARE RBC (CROSSMATCH)    EKG  EKG Interpretation  Date/Time:  Friday February 20 2017 11:51:52 EDT Ventricular Rate:  80 PR Interval:    QRS Duration: 105 QT Interval:  379 QTC Calculation: 438 R Axis:   45 Text Interpretation:  Atrial fibrillation Ventricular premature complex Repol abnrm suggests ischemia, diffuse leads Confirmed by Lacretia Leigh ([id number]) on 02/20/2017 12:18:27 PM      Troponin elevation noted and likely from demand ischemia. Case discussed with cardiology and they reviewed his EKG and will follow along. Radiology No results found.  Procedures Procedures (including critical care time)  Medications Ordered in ED Medications  0.9 %  sodium chloride infusion (not administered)  0.9 %  sodium chloride infusion (not administered)     Initial Impression / Assessment and Plan / ED Course  I have reviewed the triage vital signs and the nursing notes.  Pertinent labs & imaging results that were available during my care of the patient were reviewed by me and considered in my medical decision making (see chart for details).   patient with evidence of gastrointestinal bleeding. Has low hemoglobin will be transfused with packed red cells. EKG does show some ischemic changes likely from the patient's anemia. Have consult with GI will come and see the patient. He has no chest pain at this time. We'll admit to the  medicine service  Final Clinical Impressions(s) / ED Diagnoses   Final diagnoses:  None    New Prescriptions New Prescriptions   No medications on file     Lacretia Leigh, MD 02/20/17 1219    Lacretia Leigh, MD 02/20/17 1346

## 2017-02-20 NOTE — Progress Notes (Signed)
Anesthesia PAT Evaluation: Patient is a 76 year old male scheduled for TAVR, transfemoral approach on 02/24/17 by Dr. Burt Knack and Dr. Cyndia Bent.  History includes former smoker (quit '07), severe AS, CAD (moderate LAD) 12/2016, AAA s/p EVAR 08/09/09, afib (new onset 12/2016), bilateral ICA stenosis (mild 07/2016), hypothyroidism, COPD, skin cancer excision, bladder cancer s/p TURBT 07/22/16, 88/50/27, hypermetabolic RUL lung nodule.  - PCP is [name] Hassell Done, [place]. - Primary cardiologist is Dr. Kate Sable. He is seeing Dr. Sherren Mocha for TAVR.  - Pulmonologiost is Dr. Kara Mead. He was referred to CT surgeon Dr. Modesto Charon to evaluate RUL nodule. He will be considered for thoracic surgery for hypermetabolic lung nodule once he is recovered from TAVR.  - Vascular surgeon is Dr. Theotis Burrow. - Urologist is Dr. Franchot Gallo.  Meds include Uroxatral, amlodipine, Eliquis (now off), Lipitor, levothyroxine, lisinopril, MVI, fish oil. Started Eliquis 02/04/17 and discontinued 02/17/17 due to GI upset.   BP (!) 96/57 Comment: right arm  Pulse 90   Temp 36.7 C   Resp 20   Ht 5\' 10"  (1.778 m)   Wt 174 lb 9.6 oz (79.2 kg)   SpO2 95%   BMI 25.05 kg/m  Took BP meds this morning. Had held for two days with home BP ~ 115/76.  Patient is with his wife. He reports feeling significantly more SOB with exertion over the past week. He has not notified any of his doctors. He had to sleep in a recliner last night. He can't walk from room to room without feeling SOB and presyncopal. Symptoms have worsening particularly in the last two days. He has noticed some mild lower extremity edema. He denied chest pain. He did report dark stools since "MRI." He just stopped Eliquis 2 days ago due to GI upset.  On exam patient appears somewhat pale. Although no conversational dyspnea noted, his tendency is to lean forward. He is able to transfer independently, but with SOB noted. Heart irregularly  irregularly, III/VI harsh murmur. Lungs clear. Mild pretibial edema.   EKG 02/20/17: Afib at 87 bpm, ST/T wave abnormality, consider inferior ischemia, ST/T wave abnormality, consider anterolateral ischemia. ST depression is new V3-6 and more pronounced in inferior leads.   Cardiac cath 01/14/17: 1. Moderate proximal LAD stenosis 2. Patent left main, left circumflex, and RCA with mild nonobstructive disease 3. Severely calcified and restricted aortic valve with known severe aortic stenosis by noninvasive assessment 4. Mild pulmonary hypertension likely related to LV diastolic dysfunction in the setting of severe aortic stenosis Recommendation: The patient will continue evaluation for aortic valve replacement for treatment of severe symptomatic aortic stenosis. Will discuss his case further with Dr. Roxan Hockey. Likely move forward with TAVR evaluation with CT angio studies as next step. I think his proximal LAD stenosis can be treated medically.  Echo 10/27/16: Study Conclusions - Left ventricle: The cavity size was normal. Wall thickness was   increased in a pattern of mild LVH. Systolic function was normal.   The estimated ejection fraction was in the range of 60% to 65%. - Aortic valve: AV is thickened, calcified with restricted motion   Peak and mean gradients through the valve are 89 and 55 mm Hg   respectively consistent with severe AS. No significant change   from echo of Jan 2018 There was mild regurgitation. - Mitral valve: Calcified annulus. Mildly thickened leaflets .   There was mild regurgitation. - Left atrium: The atrium was severely dilated. - Right atrium: The atrium was  mildly dilated. - Pulmonary arteries: PA peak pressure: 33 mm Hg (S).  CT cardiac 01/29/17: IMPRESSION: 1. Trileaflet, severely thickened and symmetrically calcified aortic valve with severely restricted leaflet opening and mild calcifications extending into LVOT. Annular measurement suitable for delivery of  a 26 mm Edwards-SAPIEN 3 valve. 2.  Sufficient coronary to annulus distance. 3. Optimum Fluoroscopic Angle for Delivery:  LAO 5 CAU 6 4. No thrombus in the left atrial appendage.  Carotid U/S 07/28/16: Impression: BICA velocities suggest a < 40% stenosis.  CTA chest/abd/pelvis 01/29/17: IMPRESSION: 1. Vascular findings and measurements pertinent to potential TAVR procedure, as detailed above. This patient may have suitable pelvic arterial access for low profile devices. 2. Severe thickening calcification of the aortic valve, compatible with the reported clinical history of severe aortic stenosis. 3. Cardiomegaly with biatrial dilatation. Notably, there is a filling defect in the tip of the left atrial appendage concerning for left atrial appendage thrombus. This places the patient at risk for systemic embolization, and could be confirmed with transesophageal echocardiography if clinically appropriate. 4. Aortic atherosclerosis, in addition to left main and 3 vessel coronary artery disease. Patient is also status post aortoiliac bypass graft which appears widely patent bilaterally. Please note the proximal twist of the limbs of the graft, as discussed above. 5. 2.3 x 1.4 x 1.6 cm macrolobulated slightly spiculated right upper lobe pulmonary nodule remains highly concerning for primary bronchogenic neoplasm. There multiple prominent but nonenlarged mediastinal and hilar lymph nodes which are nonspecific. No other signs of potential metastatic disease noted elsewhere in the chest, abdomen or pelvis. 6. Mild diffuse bronchial wall thickening with mild centrilobular and paraseptal emphysema; imaging findings suggestive of underlying COPD. 7. Cholelithiasis without evidence of acute cholecystitis at this time. 8. 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 9. Additional incidental findings, as above.  CXR 02/20/17: IMPRESSION: Persistent right upper lobe mass. No  interim change from prior Studies.  ABG 02/20/17: pH 7.45, pCO2 26.9, pO2 107, HCO3 18.5, O2 sat 97.9. A1c 5.4.   Prior to labs coming back, I paged Dr. Sherren Mocha to review EKGs and report patient's new SOB and hypotension. He was also concerned about new ischemic changes on EKG. Plan was to send patient to ED for further evaluation including troponin and cardiology consultation; however, while on the phone with Dr. Burt Knack Lab called with a critical HGB of 5.9. Plan still to send to ED but now also to address symptomatic critical anemia. Although patient with known moderate LAD disease, severe AS, and rate controlled afib, he had not had ischemic changes to this extend on prior EKG, so anemia felt to be a contributing factor (demand ischemia). Fortunately his Eliquis was already stopped two days ago. Discussed results and plan for ED transfer from PAT with patient and wife. I also called and notified ED charge RN Jerene Pitch. Patient transported to E-44 via wheelchair and report given to receiving RN. I also briefly spoke with ED physician Dr. Zenia Resides prior to leaving ED ~ 11:40 AM. Dr. Burt Knack was going to update the TAVR team. Patient currently in the ED being evaluated. GI has been consulted. Troponin I is elevated at 0.24. At this point, is it still unclear of how events may effect timing of his TAVR.   George Hugh [facility]/Anesthesiology Phone [phone] 02/20/2017 1:53 PM

## 2017-02-20 NOTE — ED Notes (Signed)
2 liters Nelson applied per GI PA-C

## 2017-02-20 NOTE — Consult Note (Signed)
                                                                           [facility] Consult: 1:06 PM 02/20/2017  LOS: 0 days    Referring Provider: Dr Allen in ED  Primary Care Physician:  Martin, [name], FNP in [place].   Primary Gastroenterologist:  None.  unassigned     Reason for Consultation:  Anemia, dark stools   HPI: [name] [name] is a 76 y.o. male.  PMH Bladder cancer.  S/p TURBT of bladder tumors in 05/2016 and 06/2016, has received 9 sessions of BCG infusions as well  HTN.  HLD. COPD.  Aortic stenosis.  Bil carotid artery stenosis.  ASPVD.  S/p 2011 stent graft repair of AAA, aortoiliac bypass graft.  17 mm spiculated RUL lung nodule, hypermetabolic on PET scan concerning for bronchogenic neoplasm.  Nodular, shrunken liver and tiny gallstones, no ascites on CT angio abd/pelvis 01/29/17.  Normocytic anemia.  S/p lumbar spine surgery.   No previous EGD or colonoscopy.      Symptomatic Ao stenosis with class II sxs: exertional fatigue, DOE.  Workup in progress for TAVR which will allow for more rapid recovery after which the bronchogenic mass can be addressed with surgery.   01/14/17 Cardiac cath showed 60% proximal LAD stenosis with mild non-obstructive disease in the LCX and RCA.  Pulmonary hypertension, mild, likely due to diastolic dysfunction in setting of aortic stenosis.  Plan is medical mgt.   Started Eliquis 02/04/17 for new A fib.  Tolerated this well but earlier this week it specifically caused nausea (no emesis) and his appetite declined. The plan was to stop the Eliquis today. However when he spoke with his physicians, they said go ahead and stop it so he last took Eliquis on 8/28 Black, tarry stools started after CT angio on 8/9, stools are not daily.  Previous baseline brown stools every 2 to 3 days, uses exlax or Miralax prn.  Regular annual FOBT card  testing has always been negative.   In last 5 days the DOE, exertional chest pressure, exertional fatigue have accelerated, sxs calm with rest.    Came today for preop clearance testing.  Hgb noted 5.3.  ST depression on EKG so sent to ED. It was 10.5 in 05/2016 and 9.3 on 01/14/17.  Platelets normal.  PT/INR 18.3/1.5.   T bili 1.4.  Alk phos 74.  AST/ALT 62/50: all previously normal.  Renal function declined.  Na low at 134, previously  Normal.    Patient was never a heavy consumer of alcoholic beverages but he quit drinking in 1999, quit smoking in 2007.  Was never obese, weight stable ~ 190 # for years.  He doesn't use nonsteroidal medications. Family history positive for cancer in his mother involving the lungs and spine. His sister was an ill woman and suffered from COPD, lupus and some other issues.  No known liver disease, anemia, GIB, colorectal cancer, ulcers.    Past Medical History:  Diagnosis Date  . AAA (abdominal aortic aneurysm) (HCC)   . Aortic stenosis   . Arthritis   . Atherosclerotic peripheral vascular disease (HCC)   . Bilateral carotid artery stenosis   .   Cancer (HCC) 2006 and Nov. 9, 2013    BCC, right shoulder, lung  . COPD (chronic obstructive pulmonary disease) (HCC)   . Hyperlipidemia   . Hypertension   . Joint pain   . Skin cancer   . Thyroid disease    hypothyroid  . Vitamin D deficiency   . Weight gain     Past Surgical History:  Procedure Laterality Date  . ABDOMINAL AORTIC ANEURYSM REPAIR  08-09-09  . BACK SURGERY    . HYDROCELE EXCISION / REPAIR    . SKIN CANCER EXCISION    . SPINE SURGERY    . TRANSURETHRAL RESECTION OF BLADDER TUMOR N/A 06/06/2016   Procedure: TRANSURETHRAL RESECTION OF BLADDER TUMOR (TURBT);  Surgeon: Stephen Dahlstedt, MD;  Location: [facility];  Service: Urology;  Laterality: N/A;  . TRANSURETHRAL RESECTION OF BLADDER TUMOR N/A 07/22/2016   Procedure: TRANSURETHRAL RESECTION OF BLADDER TUMOR (TURBT);  Surgeon: Stephen Dahlstedt,  MD;  Location: [facility];  Service: Urology;  Laterality: N/A;    Prior to Admission medications   Medication Sig Start Date End Date Taking? Authorizing Provider  alfuzosin (UROXATRAL) 10 MG 24 hr tablet Take 10 mg by mouth at bedtime.     [provider]  amLODipine (NORVASC) 5 MG tablet Take 1 tablet (5 mg total) by mouth daily. 10/20/16   Martin, [name], FNP  apixaban (ELIQUIS) 5 MG TABS tablet Take 1 tablet (5 mg total) by mouth 2 (two) times daily. 02/04/17   Bartle, Bryan K, MD  atorvastatin (LIPITOR) 40 MG tablet Take 1 tablet (40 mg total) by mouth daily. 10/20/16   Martin, [name], FNP  cholecalciferol (VITAMIN D) 1000 UNITS tablet Take 1 tablet (1,000 Units total) by mouth daily. 12/11/14   Martin, [name], FNP  ibuprofen (ADVIL,MOTRIN) 200 MG tablet Take 400 mg by mouth every 6 (six) hours as needed for headache or mild pain.    [provider]  levothyroxine (SYNTHROID, LEVOTHROID) 75 MCG tablet Take 1 tablet (75 mcg total) by mouth daily. 10/20/16   Martin, [name], FNP  lisinopril (PRINIVIL,ZESTRIL) 40 MG tablet Take 1 tablet (40 mg total) by mouth daily. 10/20/16   Martin, [name], FNP  Multiple Vitamin (ONE-A-DAY MENS PO) Take 1 tablet by mouth daily.      [provider]  Omega-3 Fatty Acids (FISH OIL) 1200 MG CAPS Take 2,400 mg by mouth daily.     [provider]    Scheduled Meds:  Infusions: . sodium chloride    . sodium chloride     PRN Meds:    Allergies as of 02/20/2017  . (No Known Allergies)    Family History  Problem Relation Age of Onset  . Heart disease Father        Heart Disease before age 60  . Alcohol abuse Father   . Heart attack Father   . Cancer Mother        Lung  . COPD Mother   . COPD Sister   . Lupus Sister   . Hypertension Brother   . Gout Brother   . Heart attack Son   . Gout Brother   . Prostatitis Brother     Social History   Social History  . Marital status:  Married    Spouse name: N/A  . Number of children: N/A  . Years of education: N/A   Occupational History  . Retired     Supervisor with textile industry   Social History Main Topics  . Smoking status:   Former Smoker    Packs/day: 1.00    Years: 51.00    Types: Cigarettes    Start date: 11/25/1954    Quit date: 10/27/2005  . Smokeless tobacco: Never Used  . Alcohol use No     Comment: none since 1999  . Drug use: No  . Sexual activity: Yes   Other Topics Concern  . Not on file   Social History Narrative  . No narrative on file    REVIEW OF SYSTEMS: Constitutional:  Per HPI ENT:  No nose bleeds Pulm:  Per HPI CV:  No palpitations, no LE edema.  GU:  No hematuria, no frequency GI:  Prior to this past few weeks he never had any GI symptoms.  See HPI Heme:  Denies unusual bleeding or bruising.   Transfusions:  Patient denies previous transfusions and there is no records of transfusions in Epic. Neuro:  No headaches, no peripheral tingling or numbness Derm:  No itching, no rash or sores.  Endocrine:  No sweats or chills.  No polyuria or dysuria Immunization:  Reviewed. He is up-to-date on multiple vaccinations including pneumococcal, flu, tetanus/diphtheria. Travel:  None beyond local counties in last few months.    PHYSICAL EXAM: Vital signs in last 24 hours: Vitals:   02/20/17 1200  BP: 96/63  Pulse: 88  Resp: 12  Temp: (!) 97.5 F (36.4 C)  SpO2: 100%   Wt Readings from Last 3 Encounters:  02/20/17 78.9 kg (174 lb)  02/20/17 79.2 kg (174 lb 9.6 oz)  02/04/17 82.1 kg (181 lb)    General: Unwell, pale looking Head:  No facial asymmetry or swelling.  Eyes:  Pale conjunctiva. No scleral icterus. EOMI. Ears:  Not hard of hearing.  Nose:  No congestion, no discharge. Mouth:  Oropharynx moist, clear. Tongue midline. Neck:  No JVD, no masses, no bruits, no adenopathy, no thyromegaly. Lungs:  Somewhat diminished breath sounds but clear bilaterally. No dyspnea or  cough. Heart: RRR with audible S1, S2. I do not appreciate significant murmur. Abdomen:  Soft. Not tender or distended. No masses, no organomegaly, no bruits, no hernias. Active bowel sounds..   Rectal: No masses. No stool present so unable to perform FOBT test.   Musc/Skeltl: No joint swelling, erythema or contracture deformities. Some arthritic changes in the hands/fingers. Extremities:  No CCE.  Neurologic:  Oriented times 3. Moves all 4 limbs. No tremor. Skin:  No telangiectasia, no rashes, no sores. Nodes:  No cervical or inguinal adenopathy.   Psych:  Pleasant, cooperative, calm.  Intake/Output from previous day: No intake/output data recorded. Intake/Output this shift: No intake/output data recorded.  LAB RESULTS:  Recent Labs  02/20/17 1021  WBC 5.2  HGB 5.9*  HCT 19.9*  PLT 178   BMET Lab Results  Component Value Date   NA 134 (L) 02/20/2017   NA 141 01/14/2017   NA 141 10/20/2016   K 4.2 02/20/2017   K 4.1 01/14/2017   K 4.6 10/20/2016   CL 105 02/20/2017   CL 108 01/14/2017   CL 104 10/20/2016   CO2 17 (L) 02/20/2017   CO2 23 01/14/2017   CO2 22 10/20/2016   GLUCOSE 135 (H) 02/20/2017   GLUCOSE 104 (H) 01/14/2017   GLUCOSE 108 (H) 10/20/2016   BUN 30 (H) 02/20/2017   BUN 17 01/14/2017   BUN 18 10/20/2016   CREATININE 1.51 (H) 02/20/2017   CREATININE 1.20 01/14/2017   CREATININE 1.09 10/20/2016   CALCIUM 8.9 02/20/2017     CALCIUM 9.3 01/14/2017   CALCIUM 9.3 10/20/2016   LFT  Recent Labs  02/20/17 1021  PROT 7.1  ALBUMIN 3.7  AST 62*  ALT 50  ALKPHOS 74  BILITOT 1.4*   PT/INR Lab Results  Component Value Date   INR 1.53 02/20/2017   INR 1.20 01/14/2017   INR 1.19 06/06/2016   Hepatitis Panel No results for input(s): HEPBSAG, HCVAB, HEPAIGM, HEPBIGM in the last 72 hours. C-Diff No components found for: CDIFF Lipase  No results found for: LIPASE  Drugs of Abuse  No results found for: LABOPIA, COCAINSCRNUR, LABBENZ, AMPHETMU,  THCU, LABBARB   RADIOLOGY STUDIES: No results found.   IMPRESSION:   *   Symptomatic anemia.  Stools black, suspect GIB but no stool to sample on DRE today.    *  Incidental finding of early cirrhosis on 01/29/17 CT angiogram. Patient was not aware of this. Has not had any serologies or autoimmune marker testing.  *  Symptomatic aortic valve stenosis.  TAVR planned for 02/24/17, though this may need to be delayed.  *  Right lung nodule, suspicious for bronchogenic neoplasm. Workup/surgery for this on hold pending recovery from TAVR.    *  Began Eliquis 8/15 for new A. Fib? but discontinued this 8/28 due to it's causing nausea.    PLAN:     *  Blood transfusion planned with 2 units PRBCs to start.  CBC in AM.    *  Hepatitis B and C serologies. ANA, AMA, IgG  *  Ok to eat  Full liquids.  BID PO Protonix.          Shatira Dobosz  02/20/2017, 1:06 PM Pager: [phone]     

## 2017-02-20 NOTE — Consult Note (Signed)
Cardiology Consultation:   Patient ID: [name] [name]; [id number]; 07-Mar-1941   Admit date: 02/20/2017 Date of Consult: 02/20/2017  Primary Care Provider: [name], [place] Primary Cardiologist: Dr. Bronson Ing    Patient Profile:   [name] [name] is a 76 y.o. male with a hx of HTN, HLD, previous heavy tobacco abuse, COPD, PAD, AAA s/p stent graft repair (2011), bladder cancer s/p TURBT, RUL hypermetabolic pulmonary nodule concerning for bronchogenic neoplasm, recently diagnosed persistent atrial fibrillation and severe AS with planned TAVR next week who is being seen today for the evaluation of acute anemia and elevated troponin at the request of Dr. Aggie Moats.  History of Present Illness:   His most recent echo on 10/28/2016 showed severe AS with a mean gradient of 55 mm Hg with normal LV function.   [name] [name] recently saw Dr. Roxan Hockey for evaluation of a 17 mm spiculated nodule in the RUL that was hypermetabolic on PET scan. This was found on a lung cancer screening CT on 11/10/2016. There was no pathologic adenopathy. It was felt to be most likely a lung cancer that would eventually require surgical removal. It was felt he would not tolerate surgery with severe AS and he was referred for TAVR. Plan was to consider thoracic surgery for hypermetabolic lung nodule once he recovered from TAVR.   Cath on 01/14/2017 showed 60% proximal LAD stenosis with mild non-obstructive disease in the LCX and RCA. Dr. Burt Knack felt that his LAD stenosis could be managed medically. He was seen by Dr. Cyndia Bent for surgical consultation and felt he would be at least moderate risk for SAVR and recommended TAVR. During that office visit he was noted to be in atrial fibrillation and started on Eliquis 5mg  BID. He tolerated this well but earlier this week it caused nausea and decreased appetite. He was told to stop Eliquis and his last dose was 8/28. His TAVR date was set up for 02/24/17.  Over the past week  he has felt progressively weak and SOB, but knew he was coming to be evaluated today so just put it off. He presented to his pre admission testing appointment today in anticipation of TAVR next week. ECG showed afib with new ST/TW changes in the anterolateral leads and pre operative labs showed acute anemia with Hg 5.9. He admitted to weakness, worsening DOE and chest pain. He was transferred to [facility] ED for further work up and admission.   Currently he is feeling SOB and having mild chest tightness. No LE edema or orthopnea. He also admits to several black, liquid stools over the past several weeks.    Past Medical History:  Diagnosis Date  . AAA (abdominal aortic aneurysm) ([place])   . Aortic stenosis   . Arthritis   . Atherosclerotic peripheral vascular disease ([place])   . Atrial fibrillation ([place])    12/2016  . Bilateral carotid artery stenosis   . Cancer [facility]) 2006 and Nov. 9, 2013    BCC, right shoulder, lung  . COPD (chronic obstructive pulmonary disease) ([place])   . Hyperlipidemia   . Hypertension   . Joint pain   . Skin cancer   . Thyroid disease    hypothyroid  . Vitamin D deficiency   . Weight gain     Past Surgical History:  Procedure Laterality Date  . ABDOMINAL AORTIC ANEURYSM REPAIR  08-09-09  . BACK SURGERY    . HYDROCELE EXCISION / REPAIR    . SKIN CANCER EXCISION    .  SPINE SURGERY    . TRANSURETHRAL RESECTION OF BLADDER TUMOR N/A 06/06/2016   Procedure: TRANSURETHRAL RESECTION OF BLADDER TUMOR (TURBT);  Surgeon: Franchot Gallo, MD;  Location: [facility] ORS;  Service: Urology;  Laterality: N/A;  . TRANSURETHRAL RESECTION OF BLADDER TUMOR N/A 07/22/2016   Procedure: TRANSURETHRAL RESECTION OF BLADDER TUMOR (TURBT);  Surgeon: Franchot Gallo, MD;  Location: [facility];  Service: Urology;  Laterality: N/A;     Inpatient Medications: Scheduled Meds:  Continuous Infusions: . sodium chloride    . sodium chloride     PRN Meds:   Allergies:   No Known Allergies  Social  History:   Social History   Social History  . Marital status: Married    Spouse name: N/A  . Number of children: N/A  . Years of education: N/A   Occupational History  . 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 Smoking status: Former Smoker    Packs/day: 1.00    Years: 51.00    Types: Cigarettes    Start date: 11/25/1954    Quit date: 10/27/2005  . Smokeless tobacco: Never Used  . Alcohol use No     Comment: none since 1999  . Drug use: No  . Sexual activity: Yes   Other Topics Concern  . Not on file   Social History Narrative  . No narrative on file    Family History:   The patient's family history includes Alcohol abuse in his father; COPD in his mother and sister; Cancer in his mother; Gout in his brother and brother; Heart attack in his father and son; Heart disease in his father; Hypertension in his brother; Lupus in his sister; Prostatitis in his brother.  ROS:  Please see the history of present illness.  ROS  All other ROS reviewed and negative.     Physical Exam/Data:   Vitals:   02/20/17 1200 02/20/17 1203  BP: 96/63   Pulse: 88   Resp: 12   Temp: (!) 97.5 F (36.4 C)   TempSrc: Oral   SpO2: 100%   Weight:  174 lb (78.9 kg)  Height:  5\' 10"  (1.778 m)   No intake or output data in the 24 hours ending 02/20/17 1352 Filed Weights   02/20/17 1203  Weight: 174 lb (78.9 kg)   Body mass index is 24.97 kg/m.  General:  Well nourished, well developed, appears pale and mildly uncomfortable.  HEENT: normal Lymph: no adenopathy Neck: no JVD Endocrine:  No thryomegaly Vascular: No carotid bruits; FA pulses 2+ bilaterally without bruits  Cardiac:  normal S1, S2; RRR; harsh 4/6 SEM @ RUSB Lungs: increased work of breathing Abd: soft, nontender, no hepatomegaly  Ext: no edema Musculoskeletal:  No deformities, BUE and BLE strength normal and equal Skin: warm and dry  Neuro:  CNs 2-12 intact, no focal abnormalities  noted Psych:  Normal affect   EKG:  The EKG was personally reviewed and demonstrates:  afib with significant, new ST/TW abnormalities in anterolateral leads Telemetry:  Telemetry was personally reviewed and demonstrates:  Atrial fib with good rate control  Relevant CV Studies: Cardiac cath 01/14/17: 1. Moderate proximal LAD stenosis 2. Patent left main, left circumflex, and RCA with mild nonobstructive disease 3. Severely calcified and restricted aortic valve with known severe aortic stenosis by noninvasive assessment 4. Mild pulmonary hypertension likely related to LV diastolic dysfunction in the setting of severe aortic stenosis Recommendation: The patient will continue evaluation for  aortic valve replacement for treatment of severe symptomatic aortic stenosis. Will discuss his case further with Dr. Roxan Hockey. Likely move forward with TAVR evaluation with CT angio studies as next step. I think his proximal LAD stenosis can be treated medically.  Echo 10/27/16:  Study Conclusions - Left ventricle: The cavity size was normal. Wall thickness was increased in a pattern of mild LVH. Systolic function was normal. The estimated ejection fraction was in the range of 60% to 65%. - Aortic valve: AV is thickened, calcified with restricted motion Peak and mean gradients through the valve are 89 and 55 mm Hg respectively consistent with severe AS. No significant change from echo of Jan 2018 There was mild regurgitation. - Mitral valve: Calcified annulus. Mildly thickened leaflets . There was mild regurgitation. - Left atrium: The atrium was severely dilated. - Right atrium: The atrium was mildly dilated. - Pulmonary arteries: PA peak pressure: 33 mm Hg (S).  CT cardiac 01/29/17: IMPRESSION: 1. Trileaflet, severely thickened and symmetrically calcified aortic valve with severely restricted leaflet opening and mild calcifications extending into LVOT. Annular measurement suitable  for delivery of a 26 mm Edwards-SAPIEN 3 valve. 2. Sufficient coronary to annulus distance. 3. Optimum Fluoroscopic Angle for Delivery: LAO 5 CAU 6 4. No thrombus in the left atrial appendage.  Carotid U/S 07/28/16: Impression: BICA velocities suggest a < 40% stenosis.  CTA chest/abd/pelvis 01/29/17: IMPRESSION: 1. Vascular findings and measurements pertinent to potential TAVR procedure, as detailed above. This patient may have suitable pelvic arterial access for low profile devices. 2. Severe thickening calcification of the aortic valve, compatible with the reported clinical history of severe aortic stenosis. 3. Cardiomegaly with biatrial dilatation. Notably, there is a filling defect in the tip of the left atrial appendage concerning for left atrial appendage thrombus. This places the patient at risk for systemic embolization, and could be confirmed with transesophageal echocardiography if clinically appropriate. 4. Aortic atherosclerosis, in addition to left main and 3 vessel coronary artery disease. Patient is also status post aortoiliac bypass graft which appears widely patent bilaterally. Please note the proximal twist of the limbs of the graft, as discussed above. 5. 2.3 x 1.4 x 1.6 cm macrolobulated slightly spiculated right upper lobe pulmonary nodule remains highly concerning for primary bronchogenic neoplasm. There multiple prominent but nonenlarged mediastinal and hilar lymph nodes which are nonspecific. No other signs of potential metastatic disease noted elsewhere in the chest, abdomen or pelvis. 6. Mild diffuse bronchial wall thickening with mild centrilobular and paraseptal emphysema; imaging findings suggestive of underlying COPD. 7. Cholelithiasis without evidence of acute cholecystitis at this time. 8. 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 9. Additional incidental findings, as above  Laboratory Data:  Chemistry Recent Labs Lab  02/20/17 1021  NA 134*  K 4.2  CL 105  CO2 17*  GLUCOSE 135*  BUN 30*  CREATININE 1.51*  CALCIUM 8.9  GFRNONAA 43*  GFRAA 50*  ANIONGAP 12     Recent Labs Lab 02/20/17 1021  PROT 7.1  ALBUMIN 3.7  AST 62*  ALT 50  ALKPHOS 74  BILITOT 1.4*   Hematology Recent Labs Lab 02/20/17 1021  WBC 5.2  RBC 2.53*  HGB 5.9*  HCT 19.9*  MCV 78.7  MCH 23.3*  MCHC 29.6*  RDW 19.7*  PLT 178   Cardiac Enzymes Recent Labs Lab 02/20/17 1223  TROPONINI 0.24*   No results for input(s): TROPIPOC in the last 168 hours.  BNPNo results for input(s): BNP, PROBNP in the  last 168 hours.  DDimer No results for input(s): DDIMER in the last 168 hours.  Radiology/Studies:  Dg Chest 2 View  Result Date: 02/20/2017 CLINICAL DATA:  Aortic valve repair. EXAM: CHEST  2 VIEW COMPARISON:  CT 01/29/2017.  PET-CT 12/02/2016. FINDINGS: Mediastinum and hilar structures are normal. Cardiomegaly with normal pulmonary vascularity. Persistent right upper lobe mass. No pleural effusion or pneumothorax. IMPRESSION: Persistent right upper lobe mass. No interim change from prior studies. Electronically Signed   By: Marcello Moores  Register   On: 02/20/2017 13:33    Assessment and Plan:   Acute anemia: likely blood loss from UGIB given history of black tarry stools and being on eliquis for approximately 1 week. He is getting a blood transfusion now and GI on board.  Acute diastolic CHF: in the setting of blood transfusion and severe AS. Will give one dose of IV lasix 40mg  now.  Severe symptomatic AS: plan was for TAVR next Tuesday, but this may need to be delayed depending on the patients clinical course and GI work up. We will follow along with you.   Persistent atrial fibrillation: rate well controlled. He did not tolerate Eliquis given nausea and now its contraindicated in the setting of acute anemia  Elevated troponin and ECG changes: he likely has ischemia in the setting of severe anemia and severe AS.  Troponin elevation c/w demand ischemia and not ACS  AKI: creat up to 1.51. Continue to monitor esp since we gave IV lasix.  HTN: hypotension in the setting of acute blood loss anemia. Hold home BP meds.  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  I have reviewed the chart, notes and new data.  I agree with PA/NP's note.  Key new complaints: he is starting to become a little dyspneic and is coughing as the blood transfusion goes in. He describes liquid tarry stools intermittently for last couple of weeks. Key examination changes: a little tachypneic, but no rales. JVD present, very faint S2, AS murmur is mid-late peaking.Marland Kitchen Key new findings / data: Hgb 5.9, was 9.3 one month earlier, low MCV. MCV was normal in January, although he was mildly anemic then (10.5)Creat 1.5 and BUN 30 are higher than baseline. ECg with profound inferolateral ST depression.  PLAN: Acute diastolic HF due to severe anemia in the setting of critical AS. Ischemic changes on ECG and mild elevation in troponin are likely due to diffuse subendocardial ischemia, not a true acute athero-thrombotic coronary event. History of melena and relatively recent onset of microcytosis suggest a recent upper GI bleeding event, quite possibly precipitated by recent use of Eliquis (which has been stopped).  Needs diuretics while receiving the transfusion - ordered. Should have EGD before planned TAVR. Should not be anticoagulated. He is likely to be fragile hemodynamically due to the degree of AS. Monitor BP and renal function closely.  Sanda Klein, MD, [place] [phone] 02/20/2017, 6:04 PM

## 2017-02-21 ENCOUNTER — Inpatient Hospital Stay (HOSPITAL_COMMUNITY): Payer: Medicare Other | Admitting: Anesthesiology

## 2017-02-21 ENCOUNTER — Encounter (HOSPITAL_COMMUNITY)
Admission: EM | Disposition: A | Discharge status: Home / Self Care | Payer: Self-pay | Source: Home / Self Care | Attending: Internal Medicine

## 2017-02-21 ENCOUNTER — Encounter (HOSPITAL_COMMUNITY): Payer: Self-pay | Admitting: Certified Registered Nurse Anesthetist

## 2017-02-21 DIAGNOSIS — K2970 Gastritis, unspecified, without bleeding: Secondary | ICD-10-CM

## 2017-02-21 DIAGNOSIS — K921 Melena: Secondary | ICD-10-CM

## 2017-02-21 DIAGNOSIS — R911 Solitary pulmonary nodule: Secondary | ICD-10-CM

## 2017-02-21 DIAGNOSIS — R8290 Unspecified abnormal findings in urine: Secondary | ICD-10-CM

## 2017-02-21 DIAGNOSIS — K259 Gastric ulcer, unspecified as acute or chronic, without hemorrhage or perforation: Secondary | ICD-10-CM

## 2017-02-21 DIAGNOSIS — A049 Bacterial intestinal infection, unspecified: Secondary | ICD-10-CM

## 2017-02-21 DIAGNOSIS — K922 Gastrointestinal hemorrhage, unspecified: Secondary | ICD-10-CM

## 2017-02-21 DIAGNOSIS — I350 Nonrheumatic aortic (valve) stenosis: Secondary | ICD-10-CM

## 2017-02-21 HISTORY — PX: ESOPHAGOGASTRODUODENOSCOPY: SHX5428

## 2017-02-21 LAB — TYPE AND SCREEN
ABO/RH(D): A POS
Antibody Screen: NEGATIVE
UNIT DIVISION: 0
UNIT DIVISION: 0

## 2017-02-21 LAB — BPAM RBC
BLOOD PRODUCT EXPIRATION DATE: 201809192359
Blood Product Expiration Date: 201809192359
ISSUE DATE / TIME: 201808311437
ISSUE DATE / TIME: 201808312143
UNIT TYPE AND RH: 6200
UNIT TYPE AND RH: 6200

## 2017-02-21 LAB — BASIC METABOLIC PANEL
Anion gap: 12 (ref 5–15)
BUN: 28 mg/dL — ABNORMAL HIGH (ref 6–20)
CALCIUM: 8.6 mg/dL — AB (ref 8.9–10.3)
CO2: 20 mmol/L — ABNORMAL LOW (ref 22–32)
Chloride: 105 mmol/L (ref 101–111)
Creatinine, Ser: 1.42 mg/dL — ABNORMAL HIGH (ref 0.61–1.24)
GFR calc Af Amer: 53 mL/min — ABNORMAL LOW (ref >60)
GFR, EST NON AFRICAN AMERICAN: 46 mL/min — AB (ref >60)
GLUCOSE: 98 mg/dL (ref 65–99)
Potassium: 3.8 mmol/L (ref 3.5–5.1)
SODIUM: 137 mmol/L (ref 135–145)

## 2017-02-21 LAB — HEMOGLOBIN AND HEMATOCRIT, BLOOD
HEMATOCRIT: 28.1 % — AB (ref 39.0–52.0)
Hemoglobin: 8.8 g/dL — ABNORMAL LOW (ref 13.0–17.0)

## 2017-02-21 LAB — CBC
HCT: 27.1 % — ABNORMAL LOW (ref 39.0–52.0)
HEMOGLOBIN: 8.4 g/dL — AB (ref 13.0–17.0)
MCH: 25.1 pg — AB (ref 26.0–34.0)
MCHC: 31 g/dL (ref 30.0–36.0)
MCV: 80.9 fL (ref 78.0–100.0)
Platelets: 176 10*3/uL (ref 150–400)
RBC: 3.35 MIL/uL — ABNORMAL LOW (ref 4.22–5.81)
RDW: 18.5 % — ABNORMAL HIGH (ref 11.5–15.5)
WBC: 8 10*3/uL (ref 4.0–10.5)

## 2017-02-21 LAB — TROPONIN I
Troponin I: 0.37 ng/mL (ref <0.03)
Troponin I: 0.53 ng/mL (ref <0.03)

## 2017-02-21 LAB — URINE CULTURE: Culture: <10000 — AB

## 2017-02-21 LAB — MAGNESIUM: MAGNESIUM: 2 mg/dL (ref 1.7–2.4)

## 2017-02-21 LAB — PHOSPHORUS: Phosphorus: 3.4 mg/dL (ref 2.5–4.6)

## 2017-02-21 SURGERY — EGD (ESOPHAGOGASTRODUODENOSCOPY)
Anesthesia: Monitor Anesthesia Care

## 2017-02-21 MED ORDER — BUTAMBEN-TETRACAINE-BENZOCAINE 2-2-14 % EX AERO
INHALATION_SPRAY | CUTANEOUS | Status: DC | PRN
  Administered 2017-02-21: 1 via TOPICAL

## 2017-02-21 MED ORDER — SODIUM CHLORIDE 0.9 % IV SOLN
INTRAVENOUS | Status: DC | PRN
  Administered 2017-02-21: 14:00:00 via INTRAVENOUS

## 2017-02-21 MED ORDER — PHENYLEPHRINE HCL 10 MG/ML IJ SOLN
INTRAMUSCULAR | Status: DC | PRN
  Administered 2017-02-21 (×2): 80 ug via INTRAVENOUS
  Administered 2017-02-21: 120 ug via INTRAVENOUS
  Administered 2017-02-21: 80 ug via INTRAVENOUS
  Administered 2017-02-21: 40 ug via INTRAVENOUS
  Administered 2017-02-21: 120 ug via INTRAVENOUS

## 2017-02-21 MED ORDER — EPHEDRINE SULFATE 50 MG/ML IJ SOLN
INTRAMUSCULAR | Status: DC | PRN
  Administered 2017-02-21 (×2): 15 mg via INTRAVENOUS

## 2017-02-21 MED ORDER — PROPOFOL 500 MG/50ML IV EMUL
INTRAVENOUS | Status: DC | PRN
  Administered 2017-02-21: 100 ug/kg/min via INTRAVENOUS

## 2017-02-21 MED ORDER — PROPOFOL 10 MG/ML IV BOLUS
INTRAVENOUS | Status: DC | PRN
  Administered 2017-02-21: 10 mg via INTRAVENOUS
  Administered 2017-02-21: 15 mg via INTRAVENOUS
  Administered 2017-02-21: 10 mg via INTRAVENOUS

## 2017-02-21 NOTE — Op Note (Signed)
[facility] Patient Name: [name] [name] Procedure Date : 02/21/2017 MRN: [id number] Attending MD: Jerene Bears , MD Date of Birth: 07/19/1940 CSN: [id number] Age: 76 Admit Type: Inpatient Procedure:                Upper GI endoscopy Indications:              Acute post hemorrhagic anemia, Melena Providers:                Lajuan Lines. Hilarie Fredrickson, MD, Cleda Daub, RN, Cherylynn Ridges, Technician, Lavona Mound, CRNA Referring MD:             [facility], Dr. Cyndia Bent, Dr. Debara Pickett Medicines:                Monitored Anesthesia Care Complications:            No immediate complications. Estimated Blood Loss:     Estimated blood loss was minimal. Procedure:                Pre-Anesthesia Assessment:                           - Prior to the procedure, a History and Physical                            was performed, and patient medications and                            allergies were reviewed. The patient's tolerance of                            previous anesthesia was also reviewed. The risks                            and benefits of the procedure and the sedation                            options and risks were discussed with the patient.                            All questions were answered, and informed consent                            was obtained. Prior Anticoagulants: The patient has                            taken Eliquis (apixaban), last dose was 4 days                            prior to procedure. ASA Grade Assessment: III - A                            patient with severe systemic disease. After  reviewing the risks and benefits, the patient was                            deemed in satisfactory condition to undergo the                            procedure.                           After obtaining informed consent, the endoscope was                            passed under direct vision. Throughout the                      procedure, the patient's blood pressure, pulse, and                            oxygen saturations were monitored continuously. The                            [id number] ([id number]) scope was introduced through the                            mouth, and advanced to the second part of duodenum.                            The upper GI endoscopy was accomplished without                            difficulty. The patient tolerated the procedure                            well. Scope In: Scope Out: Findings:      The examined esophagus was normal.      There is no endoscopic evidence of varices in the entire esophagus.      Few non-bleeding superficial gastric ulcers with no stigmata of bleeding       were found in the gastric antrum. The largest lesion was 6 mm in largest       dimension. Biopsies were taken with a cold forceps for histology and       Helicobacter pylori testing (gastric body, antrum, incisura).      The cardia and gastric fundus were normal on retroflexion. No varices.      The examined duodenum was normal. Impression:               - Normal esophagus. No varices.                           - Non-bleeding gastric ulcers with no stigmata of                            bleeding. Biopsied. Low risk lesions, however in                            the  setting of Eliquis this is the likely source of                            melena and anemia.                           - Normal examined duodenum. Moderate Sedation:      N/A Recommendation:           - Return patient to hospital ward for ongoing care.                           - Resume regular, heart-healthy diet.                           - Continue present medications, but would hold                            Eliquis for now.                           - BID oral PPI.                           - Avoid ibuprofen, naproxen, or other non-steroidal                            anti-inflammatory drugs.                            - Await pathology results to exclude H. Pylori. If                            positive antibiotic treatment will be recommend at                            discharge. Procedure Code(s):        --- Professional ---                           [phone], Esophagogastroduodenoscopy, flexible,                            transoral; with biopsy, single or multiple Diagnosis Code(s):        --- Professional ---                           K25.9, Gastric ulcer, unspecified as acute or                            chronic, without hemorrhage or perforation                           D62, Acute posthemorrhagic anemia                           K92.1, Melena (includes Hematochezia) CPT copyright 2016 American Medical Association. All rights  reserved. The codes documented in this report are preliminary and upon coder review may  be revised to meet current compliance requirements. Jerene Bears, MD 02/21/2017 2:08:30 PM This report has been signed electronically. Number of Addenda: 0

## 2017-02-21 NOTE — Anesthesia Preprocedure Evaluation (Addendum)
Anesthesia Evaluation  Patient identified by MRN, date of birth, ID band Patient awake    Reviewed: Allergy & Precautions, NPO status , Patient's Chart, lab work & pertinent test results  Airway Mallampati: I  TM Distance: >3 FB Neck ROM: Full    Dental  (+) Edentulous Lower, Edentulous Upper   Pulmonary COPD, former smoker,    Pulmonary exam normal breath sounds clear to auscultation       Cardiovascular hypertension, Pt. on medications + CAD and + Peripheral Vascular Disease  (-) Past MI + dysrhythmias Atrial Fibrillation + Valvular Problems/Murmurs AS  Rhythm:Irregular Rate:Normal + Systolic murmurs 0/6301 TTE: Left ventricle: Wall thickness was increased in a pattern of mild LVH. Systolic function was normal.  - Aortic valve: AV is thickened, calcified with restricted motion.  Peak and mean gradients through the valve are 89 and 55 mm Hg respectively consistent with severe AS. There was mild regurgitation. - Mitral valve: There was mild regurgitation. - Left atrium: The atrium was severely dilated. - Right atrium: The atrium was mildly dilated. - Pulmonary arteries: PA peak pressure: 33 mm Hg (S).  Cath 12/2016  1. Moderate proximal LAD stenosis 2. Patent left main, left circumflex, and RCA with mild nonobstructive disease 3. Severely calcified and restricted aortic valve with known severe aortic stenosis by noninvasive assessment 4. Mild pulmonary hypertension likely related to LV diastolic dysfunction in the setting of severe aortic stenosis  <40% b/l ICAS  AAA s/p EVAR   Neuro/Psych negative neurological ROS  negative psych ROS   GI/Hepatic negative GI ROS, Neg liver ROS,   Endo/Other  Hypothyroidism   Renal/GU Renal disease (ARI 2/2 stone obstruction)   Bladder tumor    Musculoskeletal  (+) Arthritis , Osteoarthritis,    Abdominal   Peds  Hematology  (+) anemia ,   Anesthesia Other Findings    Reproductive/Obstetrics                           Anesthesia Physical  Anesthesia Plan  ASA: IV  Anesthesia Plan: MAC   Post-op Pain Management:    Induction: Intravenous  PONV Risk Score and Plan: 1 and Propofol infusion and Treatment may vary due to age or medical condition  Airway Management Planned: Natural Airway and Nasal Cannula  Additional Equipment:   Intra-op Plan:   Post-operative Plan:   Informed Consent: I have reviewed the patients History and Physical, chart, labs and discussed the procedure including the risks, benefits and alternatives for the proposed anesthesia with the patient or authorized representative who has indicated his/her understanding and acceptance.     Plan Discussed with: CRNA  Anesthesia Plan Comments:         Anesthesia Quick Evaluation

## 2017-02-21 NOTE — Interval H&P Note (Signed)
History and Physical Interval Note: For EGD today Pt feels much better from respiratory standpoint. Hgb up to mid 8 range after transfusion. No evidence of overt bleeding. HIGHER THAN BASELINE RISK.The nature of the procedure, as well as the risks, benefits, and alternatives were carefully and thoroughly reviewed with the patient. Ample time for discussion and questions allowed. The patient understood, was satisfied, and agreed to proceed.     02/21/2017 1:36 PM  [name] [name]  has presented today for surgery, with the diagnosis of anemia, black stools  The various methods of treatment have been discussed with the patient and family. After consideration of risks, benefits and other options for treatment, the patient has consented to  Procedure(s): ESOPHAGOGASTRODUODENOSCOPY (EGD) (N/A) as a surgical intervention .  The patient's history has been reviewed, patient examined, no change in status, stable for surgery.  I have reviewed the patient's chart and labs.  Questions were answered to the patient's satisfaction.     Navpreet Szczygiel M

## 2017-02-21 NOTE — Progress Notes (Signed)
DAILY PROGRESS NOTE   Patient Name: [name] [name] Date of Encounter: 02/21/2017  Hospital Problem List   Principal Problem:   Symptomatic anemia Active Problems:   Atrial fibrillation ([place])   Hyperlipidemia   Hypertension   Severe aortic stenosis   Lesion of right lung   Acute kidney injury superimposed on chronic kidney disease (HCC)   Elevated troponin   Cirrhosis (HCC)   BPH (benign prostatic hyperplasia)   Abnormal urinalysis   Acute diastolic heart failure [facility])    Chief Complaint   "Sleeping"  Subjective   Spoke with his wife, says he is feeling better. He just fell asleep. Did not awaken with gentle exam. Events noted yesterday - plan for EGD today. Troponin increased to 0.53, c/w demand ischemia. H/H stable at 8/27, s/p 2U PRBC. Creatinine improved.  Objective   Vitals:   02/21/17 0010 02/21/17 0400 02/21/17 0757 02/21/17 0815  BP: 112/78 (!) 117/48 (!) 115/57 117/72  Pulse: 100 (!) 43 86 (!) 42  Resp: (!) _0 Temp: 98 F (36.7 C) 98.8 F (37.1 C) 98.3 F (36.8 C) 98.5 F (36.9 C)  TempSrc: Oral Oral Oral Oral  SpO2: 95% 94% 95% 98%  Weight:  170 lb 6.4 oz (77.3 kg)    Height:        Intake/Output Summary (Last 24 hours) at 02/21/17 0924 Last data filed at 02/21/17 0815  Gross per 24 hour  Intake             2880 ml  Output             1850 ml  Net             1030 ml   Filed Weights   02/20/17 1203 02/20/17 1723 02/21/17 0400  Weight: 174 lb (78.9 kg) 174 lb 13.2 oz (79.3 kg) 170 lb 6.4 oz (77.3 kg)    Physical Exam   General appearance: asleep Neck: no carotid bruit, no JVD and thyroid not enlarged, symmetric, no tenderness/mass/nodules Lungs: clear to auscultation bilaterally Heart: irregularly irregular rhythm Extremities: extremities normal, atraumatic, no cyanosis or edema  Inpatient Medications    Scheduled Meds: . levothyroxine  37.5 mcg Intravenous Daily  . pantoprazole  40 mg Oral BID  . sodium chloride flush   3 mL Intravenous Q12H    Continuous Infusions: . sodium chloride 75 mL/hr at 02/21/17 0028  . cefTRIAXone (ROCEPHIN)  IV Stopped (02/20/17 1855)    PRN Meds: acetaminophen **OR** acetaminophen, cyclobenzaprine, ondansetron **OR** ondansetron (ZOFRAN) IV   Labs   Results for orders placed or performed during the hospital encounter of 02/20/17 (from the past 48 hour(s))  Prepare RBC     Status: None   Collection Time: 02/20/17 11:51 AM  Result Value Ref Range   Order Confirmation ORDER PROCESSED BY BLOOD BANK   Type and screen     Status: None   Collection Time: 02/20/17 12:23 PM  Result Value Ref Range   ABO/RH(D) A POS    Antibody Screen NEG    Sample Expiration 02/23/2017    Unit Number [id number]    Blood Component Type RED CELLS,LR    Unit division 00    Status of Unit ISSUED,FINAL    Transfusion Status OK TO TRANSFUSE    Crossmatch Result Compatible    Unit Number [id number]    Blood Component Type RED CELLS,LR    Unit division 00    Status of Unit ISSUED,FINAL  Transfusion Status OK TO TRANSFUSE    Crossmatch Result Compatible   Troponin I     Status: Abnormal   Collection Time: 02/20/17 12:23 PM  Result Value Ref Range   Troponin I 0.24 (HH) <0.03 ng/mL    Comment: CRITICAL RESULT CALLED TO, READ BACK BY AND VERIFIED WITH: B.MILLER,RN 02/20/17 1330 BY BSLADE   Troponin I (q 6hr x 3)     Status: Abnormal   Collection Time: 02/21/17  2:20 AM  Result Value Ref Range   Troponin I 0.37 (HH) <0.03 ng/mL    Comment: CRITICAL VALUE NOTED.  VALUE IS CONSISTENT WITH PREVIOUSLY REPORTED AND CALLED VALUE.  Magnesium     Status: None   Collection Time: 02/21/17  2:20 AM  Result Value Ref Range   Magnesium 2.0 1.7 - 2.4 mg/dL  Phosphorus     Status: None   Collection Time: 02/21/17  2:20 AM  Result Value Ref Range   Phosphorus 3.4 2.5 - 4.6 mg/dL  Hemoglobin and hematocrit, blood     Status: Abnormal   Collection Time: 02/21/17  2:20 AM  Result Value Ref Range    Hemoglobin 8.8 (L) 13.0 - 17.0 g/dL    Comment: POST TRANSFUSION SPECIMEN   HCT 28.1 (L) 39.0 - 52.0 %  CBC     Status: Abnormal   Collection Time: 02/21/17  3:57 AM  Result Value Ref Range   WBC 8.0 4.0 - 10.5 K/uL   RBC 3.35 (L) 4.22 - 5.81 MIL/uL   Hemoglobin 8.4 (L) 13.0 - 17.0 g/dL   HCT 27.1 (L) 39.0 - 52.0 %   MCV 80.9 78.0 - 100.0 fL   MCH 25.1 (L) 26.0 - 34.0 pg   MCHC 31.0 30.0 - 36.0 g/dL   RDW 18.5 (H) 11.5 - 15.5 %   Platelets 176 150 - 400 K/uL  Troponin I (q 6hr x 3)     Status: Abnormal   Collection Time: 02/21/17  3:57 AM  Result Value Ref Range   Troponin I 0.53 (HH) <0.03 ng/mL    Comment: CRITICAL VALUE NOTED.  VALUE IS CONSISTENT WITH PREVIOUSLY REPORTED AND CALLED VALUE.  Basic metabolic panel     Status: Abnormal   Collection Time: 02/21/17  3:57 AM  Result Value Ref Range   Sodium 137 135 - 145 mmol/L   Potassium 3.8 3.5 - 5.1 mmol/L   Chloride 105 101 - 111 mmol/L   CO2 20 (L) 22 - 32 mmol/L   Glucose, Bld 98 65 - 99 mg/dL   BUN 28 (H) 6 - 20 mg/dL   Creatinine, Ser 1.42 (H) 0.61 - 1.24 mg/dL   Calcium 8.6 (L) 8.9 - 10.3 mg/dL   GFR calc non Af Amer 46 (L) >60 mL/min   GFR calc Af Amer 53 (L) >60 mL/min    Comment: (NOTE) The eGFR has been calculated using the CKD EPI equation. This calculation has not been validated in all clinical situations. eGFR's persistently <60 mL/min signify possible Chronic Kidney Disease.    Anion gap 12 5 - 15    ECG   N/A  Telemetry   A-fib with CVR - Personally Reviewed  Radiology    Dg Chest 2 View  Result Date: 02/20/2017 CLINICAL DATA:  Aortic valve repair. EXAM: CHEST  2 VIEW COMPARISON:  CT 01/29/2017.  PET-CT 12/02/2016. FINDINGS: Mediastinum and hilar structures are normal. Cardiomegaly with normal pulmonary vascularity. Persistent right upper lobe mass. No pleural effusion or pneumothorax. IMPRESSION: Persistent right upper  lobe mass. No interim change from prior studies. Electronically Signed    By: Bowman   On: 02/20/2017 13:33    Cardiac Studies   N/A  Assessment   1. Principal Problem: 2.   Symptomatic anemia 3. Active Problems: 4.   Atrial fibrillation ([place]) 5.   Hyperlipidemia 6.   Hypertension 7.   Severe aortic stenosis 8.   Lesion of right lung 9.   Acute kidney injury superimposed on chronic kidney disease ([place]) 10.   Elevated troponin 11.   Cirrhosis ([place]) 12.   BPH (benign prostatic hyperplasia) 13.   Abnormal urinalysis 14.   Acute diastolic heart failure ([place]) 15.   Plan   1. Told wife he feels better today - s/p 2U PRBC's. On NS at 75 cc/hr. Also got lasix yesterday. Plan for EGD today. Holding anticoagulation. He is 1.2L positive. Given stable creatinine and no overt bleeding, will decrease IVF's to TKO.  Time Spent Directly with Patient:  I have spent a total of 25 minutes with the patient reviewing hospital notes, telemetry, EKGs, labs and examining the patient as well as establishing an assessment and plan that was discussed personally with the patient. > 50% of time was spent in direct patient care.  Length of Stay:  LOS: 1 day   Pixie Casino, MD, [place]  Attending Cardiologist  Direct Dial: [phone]  Fax: [phone]  Website:  [email or web address] 02/21/2017, 9:24 AM

## 2017-02-21 NOTE — Anesthesia Procedure Notes (Signed)
Procedure Name: MAC Date/Time: 02/21/2017 1:43 PM Performed by: Salli Quarry Kylen Schliep Pre-anesthesia Checklist: Patient identified, Emergency Drugs available, Suction available and Patient being monitored Patient Re-evaluated:Patient Re-evaluated prior to induction Oxygen Delivery Method: Nasal cannula

## 2017-02-21 NOTE — Transfer of Care (Signed)
Immediate Anesthesia Transfer of Care Note  Patient: [name] [name]  Procedure(s) Performed: Procedure(s): ESOPHAGOGASTRODUODENOSCOPY (EGD) (N/A)  Patient Location: Endoscopy Unit  Anesthesia Type:MAC  Level of Consciousness: awake, alert , oriented and patient cooperative  Airway & Oxygen Therapy: Patient Spontanous Breathing and Patient connected to nasal cannula oxygen  Post-op Assessment: Report given to RN and Post -op Vital signs reviewed and stable  Post vital signs: Reviewed and stable  Last Vitals:  Vitals:   02/21/17 1200 02/21/17 1329  BP: (!) 101/56 132/90  Pulse: 87 98  Resp: 17 19  Temp:  36.7 C  SpO2: 95% 97%    Last Pain:  Vitals:   02/21/17 1329  TempSrc: Oral  PainSc:       Patients Stated Pain Goal: 0 (64/38/38 1840)  Complications: No apparent anesthesia complications

## 2017-02-21 NOTE — Progress Notes (Signed)
Day of Surgery Procedure(s) (LRB): ESOPHAGOGASTRODUODENOSCOPY (EGD) (N/A) Subjective: Feeling better after transfusion  Objective: Vital signs in last 24 hours: Temp:  [97.5 F (36.4 C)-98.8 F (37.1 C)] 98.8 F (37.1 C) (09/01 1112) Pulse Rate:  [42-120] 86 (09/01 1112) Cardiac Rhythm: Atrial fibrillation (09/01 1052) Resp:  [14-24] 17 (09/01 1112) BP: (86-130)/(48-83) 103/62 (09/01 1112) SpO2:  [94 %-100 %] 96 % (09/01 1112) Weight:  [77.3 kg (170 lb 6.4 oz)-79.3 kg (174 lb 13.2 oz)] 77.3 kg (170 lb 6.4 oz) (09/01 0400)  Hemodynamic parameters for last 24 hours:    Intake/Output from previous day: 08/31 0701 - 09/01 0700 In: 2880 [P.O.:600; I.V.:1600; Blood:630; IV Piggyback:50] Out: 2620 [Urine:1650] Intake/Output this shift: Total I/O In: -  Out: 200 [Urine:200]  General appearance: alert and cooperative Neurologic: intact Heart: regular rate and rhythm, 3/6 systolic murmur RSB Lungs: clear to auscultation bilaterally  Lab Results:  Recent Labs  02/20/17 1021 02/21/17 0220 02/21/17 0357  WBC 5.2  --  8.0  HGB 5.9* 8.8* 8.4*  HCT 19.9* 28.1* 27.1*  PLT 178  --  176   BMET:  Recent Labs  02/20/17 1021 02/21/17 0357  NA 134* 137  K 4.2 3.8  CL 105 105  CO2 17* 20*  GLUCOSE 135* 98  BUN 30* 28*  CREATININE 1.51* 1.42*  CALCIUM 8.9 8.6*    PT/INR:  Recent Labs  02/20/17 1021  LABPROT 18.3*  INR 1.53   ABG    Component Value Date/Time   PHART 7.452 (H) 02/20/2017 1022   HCO3 18.5 (L) 02/20/2017 1022   TCO2 24 01/14/2017 1115   TCO2 23 01/14/2017 1115   ACIDBASEDEF 4.8 (H) 02/20/2017 1022   O2SAT 97.9 02/20/2017 1022   CBG (last 3)  No results for input(s): GLUCAP in the last 72 hours.  Assessment/Plan:  Severe symptomatic aortic stenosis and anemia with Hgb 5 due to GI bleeding after being placed on Eliquis for atrial fibrillation. EGD planned for today. He is still on our schedule for TAVR on Tuesday next week but will have to see what  EGD shows and decide whether to proceed or not. He has a tight aortic valve with a mean gradient of 55 mm Hg in May. He also has a 60% proximal LAD stenosis that is going to be treated medically. He has not had any angina in the past. Elevated troponin on admission due to demand ischemia.   LOS: 1 day    [name] [name] 02/21/2017

## 2017-02-21 NOTE — Progress Notes (Signed)
CRITICAL VALUE STICKER  CRITICAL VALUE: Troponin-0.53  DATE & TIME NOTIFIED:  No notification from lab   MD NOTIFIED:  [place] texted paged  RESPONSE:  No additional orders at this time

## 2017-02-21 NOTE — Progress Notes (Signed)
PROGRESS NOTE  [name] [name]  NAT:[id number] DOB: 01-Dec-1940 DOA: 02/20/2017 PCP: [name] Pretty, FNP Outpatient Specialists:  Subjective: Seen with his wife at bedside, recent melanotic stools. Denies any chest pain or heavy breathing.  Brief Narrative:  [name] [name] is a 76 y.o. male with medical history significant for hypertension, dyslipidemia, known severe aortic stenosis with TAVR procedure pending on 9/4, lesion of right lung suspicious for possible malignancy, BPH, new diagnosis of atrial fibrillation recently started on eliquis and mild stage III chronic kidney disease. He reports chronic dyspnea on exertion but for the past week this has been worse than baseline. He has had dark stools for 2 weeks. He reports 2 days after starting eliquis he developed indigestion. He reported this recently to the cardiologist who has took him off eliquis 2 days ago. He presented to this facility for preoperative lab work and was found to have a hemoglobin of 5.9 so he was sent to the ER for further evaluation and treatment. He was also found to have a mild acute kidney injury with BUN 30 creatinine 1.51 with a mild non-and an gap acidosis. In the ER his blood pressure readings have been suboptimal mainly in the 42-706 systolic range but during my examination patient is blood pressure had dropped to 85. He has been evaluated by cardiology and GI with recommendations made. 2 units packed red blood cells are pending.  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)   Elevated troponin   Cirrhosis (HCC)   BPH (benign prostatic hyperplasia)   Abnormal urinalysis   Acute diastolic heart failure (HCC)   Melena   Multiple gastric ulcers     Symptomatic anemia -Presents with  worsened from baseline dyspnea on exertion 1 week and was found to have significant anemia with a hemoglobin of 5.9 with baseline hemoglobin 9.22 December 2016 -Patient also with suboptimal blood pressure readings and during my evaluation I repeated blood pressure and he was hypotensive with a systolic blood pressure of 85 -Agree with 2 units PRBCs-obtain CBC after second unit and likely will require additional transfusions over the next 24-36 hours -Suspect GI source given dark stools reported although unable to obtain stool for DRE in ER according to GI provider. -BUN elevated and patient reporting nausea after beginning eliquis so likely upper GI in etiology -Protonix 40 mg IV every 12 hours -EGD done and showed nonbleeding ulcers but with Eliquis probably patient was bleeding from it.    Elevated troponin  -Likely demand ischemia from hypoxia and hypotension, has known 60% LAD stenosis. -Seen by cardiology, recommended to continue medical treatment.    Acute kidney injury superimposed on chronic kidney disease  -Baseline renal function 17/1.2 -Current renal function 30/1.51 -Received blood transfusion and IV fluid as well as Lasix, continued to follow, check BMP in a.m.    Atrial fibrillation  -Patient reports newly diagnosed August 15 with subsequent initiation of eliquis -Currently rate controlled not on AVN blocking agents or antiarrhythmics -Hold anticoagulation/eliquis insetting of symptomatic anemia and suspected GI bleeding -Appreciate cardiology assistance    Severe aortic stenosis -Has chronic dyspnea on exertion -TAVR tentatively planned for 9/4    Lesion of right lung -Suspicious for bronchogenic malignancy -More aggressive evaluation/workup after recovers from TAVR    Cirrhosis  -  Incidental finding early cirrhosis on CT scan August 2018 -GI has ordered serology/autoimmune markers    Abnormal urinalysis -Concerning for possible UTI -Urine culture -Empiric  Rocephin    Hyperlipidemia -Hold preadmission Lipitor and omega-3 fatty acids    Hypertension -Current blood pressure reading suboptimal so holding preadmission Norvasc and lisinopril    BPH (benign prostatic hyperplasia) -Hold Uroxatral until diet allowed    Hypothyroidism -Continue Synthroid but convert to IV   DVT prophylaxis: SCDs Code Status: Full Code Family Communication:  Disposition Plan:  Diet: Diet Heart Room service appropriate? Yes; Fluid consistency: Thin  Consultants:   GI   Cards  Procedures:   EGD showed gastric ulcer without stigmata of bleeding.  Antimicrobials:   None  Objective: Vitals:   02/21/17 1415 02/21/17 1420 02/21/17 1423 02/21/17 1438  BP: (!) 92/54 (!) 101/49 (!) 104/54 (!) 111/58  Pulse: (!) 113 (!) 101 (!) 104 94  Resp: 19 (!) 21 15 14   Temp:    (!) 97.5 F (36.4 C)  TempSrc:    Oral  SpO2: 95% 94% 94% 100%  Weight:      Height:        Intake/Output Summary (Last 24 hours) at 02/21/17 1521 Last data filed at 02/21/17 1403  Gross per 24 hour  Intake             3120 ml  Output             2050 ml  Net             1070 ml   Filed Weights   02/20/17 1203 02/20/17 1723 02/21/17 0400  Weight: 78.9 kg (174 lb) 79.3 kg (174 lb 13.2 oz) 77.3 kg (170 lb 6.4 oz)    Examination: General exam: Appears calm and comfortable  Respiratory system: Clear to auscultation. Respiratory effort normal. Cardiovascular system: S1 & S2 heard, RRR. No JVD, murmurs, rubs, gallops or clicks. No pedal edema. Gastrointestinal system: Abdomen is nondistended, soft and nontender. No organomegaly or masses felt. Normal bowel sounds heard. Central nervous system: Alert and oriented. No focal neurological deficits. Extremities: Symmetric 5 x 5 power. Skin: No rashes, lesions or ulcers Psychiatry: Judgement and insight appear normal. Mood & affect appropriate.   Data Reviewed: I have personally reviewed following labs and imaging  studies  CBC:  Recent Labs Lab 02/20/17 1021 02/21/17 0220 02/21/17 0357  WBC 5.2  --  8.0  HGB 5.9* 8.8* 8.4*  HCT 19.9* 28.1* 27.1*  MCV 78.7  --  80.9  PLT 178  --  130   Basic Metabolic Panel:  Recent Labs Lab 02/20/17 1021 02/21/17 0220 02/21/17 0357  NA 134*  --  137  K 4.2  --  3.8  CL 105  --  105  CO2 17*  --  20*  GLUCOSE 135*  --  98  BUN 30*  --  28*  CREATININE 1.51*  --  1.42*  CALCIUM 8.9  --  8.6*  MG  --  2.0  --   PHOS  --  3.4  --    GFR: Estimated Creatinine Clearance: 45 mL/min (A) (by C-G formula based on SCr of 1.42 mg/dL (H)). Liver Function Tests:  Recent Labs Lab 02/20/17 1021  AST 62*  ALT 50  ALKPHOS 74  BILITOT 1.4*  PROT 7.1  ALBUMIN 3.7   No results for input(s): LIPASE, AMYLASE in the last 168 hours. No results for input(s): AMMONIA in the last 168 hours. Coagulation  Profile:  Recent Labs Lab 02/20/17 1021  INR 1.53   Cardiac Enzymes:  Recent Labs Lab 02/20/17 1223 02/21/17 0220 02/21/17 0357  TROPONINI 0.24* 0.37* 0.53*   BNP (last 3 results) No results for input(s): PROBNP in the last 8760 hours. HbA1C:  Recent Labs  02/20/17 1022  HGBA1C 5.4   CBG: No results for input(s): GLUCAP in the last 168 hours. Lipid Profile: No results for input(s): CHOL, HDL, LDLCALC, TRIG, CHOLHDL, LDLDIRECT in the last 72 hours. Thyroid Function Tests: No results for input(s): TSH, T4TOTAL, FREET4, T3FREE, THYROIDAB in the last 72 hours. Anemia Panel: No results for input(s): VITAMINB12, FOLATE, FERRITIN, TIBC, IRON, RETICCTPCT in the last 72 hours. Urine analysis:    Component Value Date/Time   COLORURINE AMBER (A) 02/20/2017 1022   APPEARANCEUR HAZY (A) 02/20/2017 1022   APPEARANCEUR Cloudy (A) 02/27/2016 1758   LABSPEC 1.019 02/20/2017 1022   PHURINE 5.0 02/20/2017 1022   GLUCOSEU NEGATIVE 02/20/2017 1022   HGBUR NEGATIVE 02/20/2017 1022   BILIRUBINUR NEGATIVE 02/20/2017 1022   BILIRUBINUR Negative 02/27/2016  1758   KETONESUR NEGATIVE 02/20/2017 1022   PROTEINUR 30 (A) 02/20/2017 1022   UROBILINOGEN 0.2 08/07/2009 0953   NITRITE NEGATIVE 02/20/2017 1022   LEUKOCYTESUR NEGATIVE 02/20/2017 1022   LEUKOCYTESUR Negative 02/27/2016 1758   Sepsis Labs: @LABRCNTIP (procalcitonin:4,lacticidven:4)  ) Recent Results (from the past 240 hour(s))  Surgical pcr screen     Status: None   Collection Time: 02/20/17 10:21 AM  Result Value Ref Range Status   MRSA, PCR NEGATIVE NEGATIVE Final   Staphylococcus aureus NEGATIVE NEGATIVE Final    Comment: (NOTE) The Xpert SA Assay (FDA approved for NASAL specimens in patients 76 years of age and older), is one component of a comprehensive surveillance program. It is not intended to diagnose infection nor to guide or monitor treatment.   Urine Culture     Status: Abnormal   Collection Time: 02/20/17 10:22 AM  Result Value Ref Range Status   Specimen Description URINE, RANDOM  Final   Special Requests NONE  Final   Culture <10,000 COLONIES/mL INSIGNIFICANT GROWTH (A)  Final   Report Status 02/21/2017 FINAL  Final     Invalid input(s): PROCALCITONIN, LACTICACIDVEN   Radiology Studies: Dg Chest 2 View  Result Date: 02/20/2017 CLINICAL DATA:  Aortic valve repair. EXAM: CHEST  2 VIEW COMPARISON:  CT 01/29/2017.  PET-CT 12/02/2016. FINDINGS: Mediastinum and hilar structures are normal. Cardiomegaly with normal pulmonary vascularity. Persistent right upper lobe mass. No pleural effusion or pneumothorax. IMPRESSION: Persistent right upper lobe mass. No interim change from prior studies. Electronically Signed   By: Erwin   On: 02/20/2017 13:33        Scheduled Meds: . levothyroxine  37.5 mcg Intravenous Daily  . pantoprazole  40 mg Oral BID  . sodium chloride flush  3 mL Intravenous Q12H   Continuous Infusions: . cefTRIAXone (ROCEPHIN)  IV Stopped (02/20/17 1855)     LOS: 1 day    Time spent: 35 minutes    Birdie Hopes, MD [facility] Pager [phone]  If 7PM-7AM, please contact night-coverage [email or web address] Password [email or web address] 02/21/2017, 3:21 PM

## 2017-02-21 NOTE — H&P (View-Only) (Signed)
[facility] Consult: 1:06 PM 02/20/2017  LOS: 0 days    Referring Provider: Dr Zenia Resides in ED  Primary Care Physician:  [name], [facility] in [place].   Primary Gastroenterologist:  None.  unassigned     Reason for Consultation:  Anemia, dark stools   HPI: [name] [name] is a 76 y.o. male.  PMH Bladder cancer.  S/p TURBT of bladder tumors in 05/2016 and 06/2016, has received 9 sessions of BCG infusions as well  HTN.  HLD. COPD.  Aortic stenosis.  Bil carotid artery stenosis.  ASPVD.  S/p 2011 stent graft repair of AAA, aortoiliac bypass graft.  17 mm spiculated RUL lung nodule, hypermetabolic on PET scan concerning for bronchogenic neoplasm.  Nodular, shrunken liver and tiny gallstones, no ascites on CT angio abd/pelvis 01/29/17.  Normocytic anemia.  S/p lumbar spine surgery.   No previous EGD or colonoscopy.      Symptomatic Ao stenosis with class II sxs: exertional fatigue, DOE.  Workup in progress for TAVR which will allow for more rapid recovery after which the bronchogenic mass can be addressed with surgery.   01/14/17 Cardiac cath showed 60% proximal LAD stenosis with mild non-obstructive disease in the LCX and RCA.  Pulmonary hypertension, mild, likely due to diastolic dysfunction in setting of aortic stenosis.  Plan is medical mgt.   Started Eliquis 02/04/17 for new A fib.  Tolerated this well but earlier this week it specifically caused nausea (no emesis) and his appetite declined. The plan was to stop the Eliquis today. However when he spoke with his physicians, they said go ahead and stop it so he last took Eliquis on 8/28 Black, tarry stools started after CT angio on 8/9, stools are not daily.  Previous baseline brown stools every 2 to 3 days, uses exlax or Miralax prn.  Regular annual FOBT card  testing has always been negative.   In last 5 days the DOE, exertional chest pressure, exertional fatigue have accelerated, sxs calm with rest.    Came today for preop clearance testing.  Hgb noted 5.3.  ST depression on EKG so sent to ED. It was 10.5 in 05/2016 and 9.3 on 01/14/17.  Platelets normal.  PT/INR 18.3/1.5.   T bili 1.4.  Alk phos 74.  AST/ALT 62/50: all previously normal.  Renal function declined.  Na low at 134, previously  Normal.    Patient was never a heavy consumer of alcoholic beverages but he quit drinking in 1999, quit smoking in 2007.  Was never obese, weight stable ~ 190 # for years.  He doesn't use nonsteroidal medications. Family history positive for cancer in his mother involving the lungs and spine. His sister was an ill woman and suffered from COPD, lupus and some other issues.  No known liver disease, anemia, GIB, colorectal cancer, ulcers.    Past Medical History:  Diagnosis Date  . AAA (abdominal aortic aneurysm) ([place])   . Aortic stenosis   . Arthritis   . Atherosclerotic peripheral vascular disease ([place])   . Bilateral carotid artery stenosis   .  Cancer [facility]) 2006 and Nov. 9, 2013    BCC, right shoulder, lung  . COPD (chronic obstructive pulmonary disease) ([place])   . Hyperlipidemia   . Hypertension   . Joint pain   . Skin cancer   . Thyroid disease    hypothyroid  . Vitamin D deficiency   . Weight gain     Past Surgical History:  Procedure Laterality Date  . ABDOMINAL AORTIC ANEURYSM REPAIR  08-09-09  . BACK SURGERY    . HYDROCELE EXCISION / REPAIR    . SKIN CANCER EXCISION    . SPINE SURGERY    . TRANSURETHRAL RESECTION OF BLADDER TUMOR N/A 06/06/2016   Procedure: TRANSURETHRAL RESECTION OF BLADDER TUMOR (TURBT);  Surgeon: Franchot Gallo, MD;  Location: [facility];  Service: Urology;  Laterality: N/A;  . TRANSURETHRAL RESECTION OF BLADDER TUMOR N/A 07/22/2016   Procedure: TRANSURETHRAL RESECTION OF BLADDER TUMOR (TURBT);  Surgeon: Franchot Gallo,  MD;  Location: [facility];  Service: Urology;  Laterality: N/A;    Prior to Admission medications   Medication Sig Start Date End Date Taking? Authorizing Provider  alfuzosin (UROXATRAL) 10 MG 24 hr tablet Take 10 mg by mouth at bedtime.     [provider]  amLODipine (NORVASC) 5 MG tablet Take 1 tablet (5 mg total) by mouth daily. 10/20/16   Hassell Done, [name], FNP  apixaban (ELIQUIS) 5 MG TABS tablet Take 1 tablet (5 mg total) by mouth 2 (two) times daily. 02/04/17   Gaye Pollack, MD  atorvastatin (LIPITOR) 40 MG tablet Take 1 tablet (40 mg total) by mouth daily. 10/20/16   Hassell Done [name], FNP  cholecalciferol (VITAMIN D) 1000 UNITS tablet Take 1 tablet (1,000 Units total) by mouth daily. 12/11/14   Hassell Done, [name], FNP  ibuprofen (ADVIL,MOTRIN) 200 MG tablet Take 400 mg by mouth every 6 (six) hours as needed for headache or mild pain.    [provider]  levothyroxine (SYNTHROID, LEVOTHROID) 75 MCG tablet Take 1 tablet (75 mcg total) by mouth daily. 10/20/16   Hassell Done, [name], FNP  lisinopril (PRINIVIL,ZESTRIL) 40 MG tablet Take 1 tablet (40 mg total) by mouth daily. 10/20/16   Hassell Done [name], FNP  Multiple Vitamin (ONE-A-DAY MENS PO) Take 1 tablet by mouth daily.      [provider]  Omega-3 Fatty Acids (FISH OIL) 1200 MG CAPS Take 2,400 mg by mouth daily.     [provider]    Scheduled Meds:  Infusions: . sodium chloride    . sodium chloride     PRN Meds:    Allergies as of 02/20/2017  . (No Known Allergies)    Family History  Problem Relation Age of Onset  . Heart disease Father        Heart Disease before age 69  . Alcohol abuse Father   . Heart attack Father   . Cancer Mother        Lung  . COPD Mother   . COPD Sister   . Lupus Sister   . Hypertension Brother   . Gout Brother   . Heart attack Son   . Gout Brother   . Prostatitis Brother     Social History   Social History  . Marital status:  Married    Spouse name: N/A  . Number of children: N/A  . Years of education: N/A   Occupational History  . 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 Smoking status:  Former Smoker    Packs/day: 1.00    Years: 51.00    Types: Cigarettes    Start date: 11/25/1954    Quit date: 10/27/2005  . Smokeless tobacco: Never Used  . Alcohol use No     Comment: none since 1999  . Drug use: No  . Sexual activity: Yes   Other Topics Concern  . Not on file   Social History Narrative  . No narrative on file    REVIEW OF SYSTEMS: Constitutional:  Per HPI ENT:  No nose bleeds Pulm:  Per HPI CV:  No palpitations, no LE edema.  GU:  No hematuria, no frequency GI:  Prior to this past few weeks he never had any GI symptoms.  See HPI Heme:  Denies unusual bleeding or bruising.   Transfusions:  Patient denies previous transfusions and there is no records of transfusions in Epic. Neuro:  No headaches, no peripheral tingling or numbness Derm:  No itching, no rash or sores.  Endocrine:  No sweats or chills.  No polyuria or dysuria Immunization:  Reviewed. He is up-to-date on multiple vaccinations including pneumococcal, flu, tetanus/diphtheria. Travel:  None beyond local counties in last few months.    PHYSICAL EXAM: Vital signs in last 24 hours: Vitals:   02/20/17 1200  BP: 96/63  Pulse: 88  Resp: 12  Temp: (!) 97.5 F (36.4 C)  SpO2: 100%   Wt Readings from Last 3 Encounters:  02/20/17 78.9 kg (174 lb)  02/20/17 79.2 kg (174 lb 9.6 oz)  02/04/17 82.1 kg (181 lb)    General: Unwell, pale looking Head:  No facial asymmetry or swelling.  Eyes:  Pale conjunctiva. No scleral icterus. EOMI. Ears:  Not hard of hearing.  Nose:  No congestion, no discharge. Mouth:  Oropharynx moist, clear. Tongue midline. Neck:  No JVD, no masses, no bruits, no adenopathy, no thyromegaly. Lungs:  Somewhat diminished breath sounds but clear bilaterally. No dyspnea or  cough. Heart: RRR with audible S1, S2. I do not appreciate significant murmur. Abdomen:  Soft. Not tender or distended. No masses, no organomegaly, no bruits, no hernias. Active bowel sounds..   Rectal: No masses. No stool present so unable to perform FOBT test.   Musc/Skeltl: No joint swelling, erythema or contracture deformities. Some arthritic changes in the hands/fingers. Extremities:  No CCE.  Neurologic:  Oriented times 3. Moves all 4 limbs. No tremor. Skin:  No telangiectasia, no rashes, no sores. Nodes:  No cervical or inguinal adenopathy.   Psych:  Pleasant, cooperative, calm.  Intake/Output from previous day: No intake/output data recorded. Intake/Output this shift: No intake/output data recorded.  LAB RESULTS:  Recent Labs  02/20/17 1021  WBC 5.2  HGB 5.9*  HCT 19.9*  PLT 178   BMET Lab Results  Component Value Date   NA 134 (L) 02/20/2017   NA 141 01/14/2017   NA 141 10/20/2016   K 4.2 02/20/2017   K 4.1 01/14/2017   K 4.6 10/20/2016   CL 105 02/20/2017   CL 108 01/14/2017   CL 104 10/20/2016   CO2 17 (L) 02/20/2017   CO2 23 01/14/2017   CO2 22 10/20/2016   GLUCOSE 135 (H) 02/20/2017   GLUCOSE 104 (H) 01/14/2017   GLUCOSE 108 (H) 10/20/2016   BUN 30 (H) 02/20/2017   BUN 17 01/14/2017   BUN 18 10/20/2016   CREATININE 1.51 (H) 02/20/2017   CREATININE 1.20 01/14/2017   CREATININE 1.09 10/20/2016   CALCIUM 8.9 02/20/2017  CALCIUM 9.3 01/14/2017   CALCIUM 9.3 10/20/2016   LFT  Recent Labs  02/20/17 1021  PROT 7.1  ALBUMIN 3.7  AST 62*  ALT 50  ALKPHOS 74  BILITOT 1.4*   PT/INR Lab Results  Component Value Date   INR 1.53 02/20/2017   INR 1.20 01/14/2017   INR 1.19 06/06/2016   Hepatitis Panel No results for input(s): HEPBSAG, HCVAB, HEPAIGM, HEPBIGM in the last 72 hours. C-Diff No components found for: CDIFF Lipase  No results found for: LIPASE  Drugs of Abuse  No results found for: LABOPIA, COCAINSCRNUR, LABBENZ, AMPHETMU,  THCU, LABBARB   RADIOLOGY STUDIES: No results found.   IMPRESSION:   *   Symptomatic anemia.  Stools black, suspect GIB but no stool to sample on DRE today.    *  Incidental finding of early cirrhosis on 01/29/17 CT angiogram. Patient was not aware of this. Has not had any serologies or autoimmune marker testing.  *  Symptomatic aortic valve stenosis.  TAVR planned for 02/24/17, though this may need to be delayed.  *  Right lung nodule, suspicious for bronchogenic neoplasm. Workup/surgery for this on hold pending recovery from TAVR.    *  Began Eliquis 8/15 for new A. Fib? but discontinued this 8/28 due to it's causing nausea.    PLAN:     *  Blood transfusion planned with 2 units PRBCs to start.  CBC in AM.    *  Hepatitis B and C serologies. ANA, AMA, IgG  *  Ok to eat  Full liquids.  BID PO Protonix.          Azucena Freed  02/20/2017, 1:06 PM Pager: [phone]

## 2017-02-21 NOTE — Anesthesia Postprocedure Evaluation (Signed)
Anesthesia Post Note  Patient: [name] [name]  Procedure(s) Performed: Procedure(s) (LRB): ESOPHAGOGASTRODUODENOSCOPY (EGD) (N/A)     Patient location during evaluation: PACU Anesthesia Type: MAC Level of consciousness: awake and alert Pain management: pain level controlled Vital Signs Assessment: post-procedure vital signs reviewed and stable Respiratory status: spontaneous breathing, nonlabored ventilation, respiratory function stable and patient connected to nasal cannula oxygen Cardiovascular status: stable and blood pressure returned to baseline Anesthetic complications: no    Last Vitals:  Vitals:   02/21/17 1423 02/21/17 1438  BP: (!) 104/54 (!) 111/58  Pulse: (!) 104 94  Resp: 15 14  Temp:  (!) 36.4 C  SpO2: 94% 100%    Last Pain:  Vitals:   02/21/17 1438  TempSrc: Oral  PainSc: 0-No pain                 Audry Pili

## 2017-02-22 ENCOUNTER — Encounter (HOSPITAL_COMMUNITY): Payer: Self-pay | Admitting: Internal Medicine

## 2017-02-22 DIAGNOSIS — K7460 Unspecified cirrhosis of liver: Secondary | ICD-10-CM

## 2017-02-22 DIAGNOSIS — K259 Gastric ulcer, unspecified as acute or chronic, without hemorrhage or perforation: Secondary | ICD-10-CM

## 2017-02-22 LAB — CBC
HEMATOCRIT: 28.2 % — AB (ref 39.0–52.0)
Hemoglobin: 8.6 g/dL — ABNORMAL LOW (ref 13.0–17.0)
MCH: 25 pg — ABNORMAL LOW (ref 26.0–34.0)
MCHC: 30.5 g/dL (ref 30.0–36.0)
MCV: 82 fL (ref 78.0–100.0)
Platelets: 171 10*3/uL (ref 150–400)
RBC: 3.44 MIL/uL — ABNORMAL LOW (ref 4.22–5.81)
RDW: 19.1 % — AB (ref 11.5–15.5)
WBC: 7.5 10*3/uL (ref 4.0–10.5)

## 2017-02-22 LAB — BASIC METABOLIC PANEL
ANION GAP: 11 (ref 5–15)
BUN: 22 mg/dL — AB (ref 6–20)
CHLORIDE: 106 mmol/L (ref 101–111)
CO2: 20 mmol/L — AB (ref 22–32)
Calcium: 8.4 mg/dL — ABNORMAL LOW (ref 8.9–10.3)
Creatinine, Ser: 1.3 mg/dL — ABNORMAL HIGH (ref 0.61–1.24)
GFR calc Af Amer: 59 mL/min — ABNORMAL LOW (ref >60)
GFR calc non Af Amer: 51 mL/min — ABNORMAL LOW (ref >60)
GLUCOSE: 98 mg/dL (ref 65–99)
POTASSIUM: 3.3 mmol/L — AB (ref 3.5–5.1)
SODIUM: 137 mmol/L (ref 135–145)

## 2017-02-22 LAB — IGG: IgG (Immunoglobin G), Serum: 1526 mg/dL (ref 700–1600)

## 2017-02-22 MED ORDER — HYDROCODONE-ACETAMINOPHEN 5-325 MG PO TABS
1.0000 | ORAL_TABLET | Freq: Four times a day (QID) | ORAL | Status: DC | PRN
  Administered 2017-02-23: 1 via ORAL
  Administered 2017-02-23: 2 via ORAL
  Administered 2017-02-24 (×3): 1 via ORAL
  Filled 2017-02-22 (×4): qty 1
  Filled 2017-02-22: qty 2

## 2017-02-22 MED ORDER — HYDROCODONE-ACETAMINOPHEN 5-325 MG PO TABS
2.0000 | ORAL_TABLET | Freq: Once | ORAL | Status: AC
  Administered 2017-02-22: 2 via ORAL
  Filled 2017-02-22: qty 2

## 2017-02-22 MED ORDER — MUSCLE RUB 10-15 % EX CREA
TOPICAL_CREAM | CUTANEOUS | Status: DC | PRN
  Administered 2017-02-22: via TOPICAL
  Filled 2017-02-22 (×3): qty 85

## 2017-02-22 NOTE — Progress Notes (Signed)
Daily Rounding Note  02/22/2017, 9:41 AM  LOS: 2 days   SUBJECTIVE:   Chief complaint: pain in left hip, flare of chronic problem.     No BMs.  No nausea, no dizziness,  No SOB.    OBJECTIVE:         Vital signs in last 24 hours:    Temp:  [97.5 F (36.4 C)-98.8 F (37.1 C)] 98.4 F (36.9 C) (09/02 0900) Pulse Rate:  [64-113] 88 (09/02 0900) Resp:  [14-60] 19 (09/02 0900) BP: (88-139)/(49-90) 107/64 (09/02 0900) SpO2:  [94 %-100 %] 94 % (09/02 0900) Weight:  [78.3 kg (172 lb 9.9 oz)] 78.3 kg (172 lb 9.9 oz) (09/02 0316) Last BM Date: 02/20/17 Filed Weights   02/20/17 1723 02/21/17 0400 02/22/17 0316  Weight: 79.3 kg (174 lb 13.2 oz) 77.3 kg (170 lb 6.4 oz) 78.3 kg (172 lb 9.9 oz)   General: less pale, comfortable   Heart: Irreg, irreg with murmer Chest: clear bil.  No cough or labored  breathing Abdomen: soft, NT.  Active BS.  ND  Extremities: no CCE Neuro/Psych:  Pleasant, calm, oriented x 3.  No gross deficits.    Intake/Output from previous day: 09/01 0701 - 09/02 0700 In: 1020 [P.O.:720; I.V.:250; IV Piggyback:50] Out: 1050 [Urine:1050]  Intake/Output this shift: Total I/O In: 360 [P.O.:360] Out: 225 [Urine:225]  Lab Results:  Recent Labs  02/20/17 1021 02/21/17 0220 02/21/17 0357 02/22/17 0339  WBC 5.2  --  8.0 7.5  HGB 5.9* 8.8* 8.4* 8.6*  HCT 19.9* 28.1* 27.1* 28.2*  PLT 178  --  176 171   BMET  Recent Labs  02/20/17 1021 02/21/17 0357 02/22/17 0339  NA 134* 137 137  K 4.2 3.8 3.3*  CL 105 105 106  CO2 17* 20* 20*  GLUCOSE 135* 98 98  BUN 30* 28* 22*  CREATININE 1.51* 1.42* 1.30*  CALCIUM 8.9 8.6* 8.4*   LFT  Recent Labs  02/20/17 1021  PROT 7.1  ALBUMIN 3.7  AST 62*  ALT 50  ALKPHOS 74  BILITOT 1.4*   PT/INR  Recent Labs  02/20/17 1021  LABPROT 18.3*  INR 1.53   Hepatitis Panel No results for input(s): HEPBSAG, HCVAB, HEPAIGM, HEPBIGM in the last 72  hours.  Studies/Results: Dg Chest 2 View  Result Date: 02/20/2017 CLINICAL DATA:  Aortic valve repair. EXAM: CHEST  2 VIEW COMPARISON:  CT 01/29/2017.  PET-CT 12/02/2016. FINDINGS: Mediastinum and hilar structures are normal. Cardiomegaly with normal pulmonary vascularity. Persistent right upper lobe mass. No pleural effusion or pneumothorax. IMPRESSION: Persistent right upper lobe mass. No interim change from prior studies. Electronically Signed   By: [place]   On: 02/20/2017 13:33   Scheduled Meds: . levothyroxine  37.5 mcg Intravenous Daily  . pantoprazole  40 mg Oral BID  . sodium chloride flush  3 mL Intravenous Q12H   Continuous Infusions: . cefTRIAXone (ROCEPHIN)  IV Stopped (02/21/17 1659)   PRN Meds:.acetaminophen **OR** acetaminophen, cyclobenzaprine, ondansetron **OR** ondansetron (ZOFRAN) IV  ASSESMENT:   *   Symptomatic anemia.  Stools black, suspect GIB but no stool to sample on DRE today.   9/1 EGD with non-bleeding GUs, no bleeding stigmata but likely did bleed in setting of Eliquis.   Hgb stable, improved after PRBC x 2.    *  Incidental finding of early cirrhosis on 01/29/17 CT angiogram. Patient was not aware of this. Has not had any serologies or autoimmune  marker testing. No stigmata of cirrhosis on EGD.  Coags OK.    *  Symptomatic aortic valve stenosis.  TAVR planned for 02/24/17  *  Right lung nodule, suspicious for bronchogenic neoplasm. Workup/surgery for this on hold pending recovery from TAVR.    *  Began Eliquis 8/15 for new A. Fib discontinued 8/28.     PLAN   Discharge Planning Diet:  Heart Anticoagulation and antiplatelets: per cardiology Discharge Medications: Protonix 40 mg po BID for 4 weeks, then 1 x daily indefinitely.   Follow up: with GI prn. Follow CBC   Procedure Procedures planned and timing of procedure: EGD was on 9/1.   Lab work ordered: path reading on esophageal biopsy pending, r/o presence of H Pylori  Contact #:  office at [phone] if pt has questions.          Azucena Freed  02/22/2017, 9:41 AM Pager: [phone]

## 2017-02-22 NOTE — Progress Notes (Signed)
1 Day Post-Op Procedure(s) (LRB): ESOPHAGOGASTRODUODENOSCOPY (EGD) (N/A) Subjective: Feels fine.  EGD showed small gastric ulcers with no bleeding.  Objective: Vital signs in last 24 hours: Temp:  [97.5 F (36.4 C)-98.7 F (37.1 C)] 97.8 F (36.6 C) (09/02 1109) Pulse Rate:  [84-113] 91 (09/02 1109) Cardiac Rhythm: Atrial fibrillation (09/02 1109) Resp:  [14-60] 18 (09/02 1109) BP: (88-139)/(49-90) 111/75 (09/02 1109) SpO2:  [93 %-100 %] 93 % (09/02 1109) Weight:  [78.3 kg (172 lb 9.9 oz)] 78.3 kg (172 lb 9.9 oz) (09/02 0316)  Hemodynamic parameters for last 24 hours:    Intake/Output from previous day: 09/01 0701 - 09/02 0700 In: 1020 [P.O.:720; I.V.:250; IV Piggyback:50] Out: 1050 [Urine:1050] Intake/Output this shift: Total I/O In: 360 [P.O.:360] Out: 425 [Urine:425]  General appearance: alert and cooperative Heart: irregularly irregular rhythm and 3/6 systolic murmur RSB Lungs: clear to auscultation bilaterally  Lab Results:  Recent Labs  02/21/17 0357 02/22/17 0339  WBC 8.0 7.5  HGB 8.4* 8.6*  HCT 27.1* 28.2*  PLT 176 171   BMET:  Recent Labs  02/21/17 0357 02/22/17 0339  NA 137 137  K 3.8 3.3*  CL 105 106  CO2 20* 20*  GLUCOSE 98 98  BUN 28* 22*  CREATININE 1.42* 1.30*  CALCIUM 8.6* 8.4*    PT/INR:  Recent Labs  02/20/17 1021  LABPROT 18.3*  INR 1.53   ABG    Component Value Date/Time   PHART 7.452 (H) 02/20/2017 1022   HCO3 18.5 (L) 02/20/2017 1022   TCO2 24 01/14/2017 1115   TCO2 23 01/14/2017 1115   ACIDBASEDEF 4.8 (H) 02/20/2017 1022   O2SAT 97.9 02/20/2017 1022   CBG (last 3)  No results for input(s): GLUCAP in the last 72 hours.  Assessment/Plan:  Severe aortic stenosis. 60% proximal LAD stenosis.  UGI bleed on Eliquis with symptomatic anemia. No active bleeding seen on EGD and feels well after transfusion.  We will plan to proceed with TAVR on Tuesday am.  LOS: 2 days    Gaye Pollack 02/22/2017

## 2017-02-22 NOTE — Progress Notes (Signed)
Pt asking for heating pad for chronic hip pain when he sleeps, I notified Dr Hartford Poli and got order for k-pad.

## 2017-02-22 NOTE — Progress Notes (Signed)
Patient c/o right hip pain, states he has had problems in the past, including surgery and injections to right hip. States he uses aspercreme and bio freeze at home. Asked K. Schorr to order muscle reliever cream. Tylenol and Flexeril given. Will continue to monitor.

## 2017-02-22 NOTE — Progress Notes (Signed)
PROGRESS NOTE  [name] [name]  QPY:[id number] DOB: 1940/08/21 DOA: 02/20/2017 PCP: [name], FNP Outpatient Specialists:  Subjective: Seen with his wife at bedside, feels better, denies any complaints. No BM since yesterday. TAVR scheduled for 02/24/2017  Brief Narrative:  [name] [name] is a 76 y.o. male with medical history significant for hypertension, dyslipidemia, known severe aortic stenosis with TAVR procedure pending on 9/4, lesion of right lung suspicious for possible malignancy, BPH, new diagnosis of atrial fibrillation recently started on eliquis and mild stage III chronic kidney disease. He reports chronic dyspnea on exertion but for the past week this has been worse than baseline. He has had dark stools for 2 weeks. He reports 2 days after starting eliquis he developed indigestion. He reported this recently to the cardiologist who has took him off eliquis 2 days ago. He presented to this facility for preoperative lab work and was found to have a hemoglobin of 5.9 so he was sent to the ER for further evaluation and treatment. He was also found to have a mild acute kidney injury with BUN 30 creatinine 1.51 with a mild non-and an gap acidosis. In the ER his blood pressure readings have been suboptimal mainly in the 12-458 systolic range but during my examination patient is blood pressure had dropped to 85. He has been evaluated by cardiology and GI with recommendations made. 2 units packed red blood cells are pending.  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)   Elevated troponin   Cirrhosis (HCC)   BPH (benign prostatic hyperplasia)   Abnormal urinalysis   Acute diastolic heart failure (HCC)   Melena   Multiple gastric ulcers     Symptomatic  anemia -Presents with worsened from baseline dyspnea on exertion 1 week and was found to have significant anemia with a hemoglobin of 5.9 with baseline hemoglobin 9.22 December 2016 -Patient also with suboptimal blood pressure readings and during my evaluation I repeated blood pressure and he was hypotensive with a systolic blood pressure of 85 -Agree with 2 units PRBCs-obtain CBC after second unit and likely will require additional transfusions over the next 24-36 hours. -Continue Protonix 40 mg IV every 12 hours -EGD done and showed nonbleeding ulcers, GI recommended to hold liquids for now.    Elevated troponin  -Likely demand ischemia from hypoxia and hypotension, has known 60% LAD stenosis. -Seen by cardiology, recommended to continue medical treatment. No aspirin because of concurrent bleeding.    Acute kidney injury superimposed on chronic kidney disease  -Baseline renal function 17/1.2 -Current renal function 30/1.51 -Check BMP in a.m.    Atrial fibrillation  -Patient reports newly diagnosed August 15 with subsequent initiation of eliquis -Currently rate controlled not on AVN blocking agents or antiarrhythmics -Hold anticoagulation/eliquis insetting of symptomatic anemia and suspected GI bleeding -Appreciate cardiology assistance    Severe aortic stenosis -Has chronic dyspnea on exertion -TAVR tentatively planned for 9/4    Lesion of right lung -Suspicious for bronchogenic malignancy -More aggressive evaluation/workup after recovers from TAVR    Cirrhosis  -Incidental finding early cirrhosis on CT scan August 2018 -GI has ordered serology/autoimmune markers    Abnormal urinalysis -Concerning for possible UTI -Urine culture -Empiric Rocephin    Hyperlipidemia -Hold preadmission Lipitor and omega-3 fatty acids  Hypertension -Current blood pressure reading suboptimal so holding preadmission Norvasc and lisinopril    BPH (benign prostatic hyperplasia) -Hold  Uroxatral until diet allowed    Hypothyroidism -Continue Synthroid but convert to IV   DVT prophylaxis: SCDs Code Status: Full Code Family Communication:  Disposition Plan:  Diet: Diet Heart Room service appropriate? Yes; Fluid consistency: Thin  Consultants:   GI   Cards  Procedures:   EGD showed gastric ulcer without stigmata of bleeding.  Antimicrobials:   None  Objective: Vitals:   02/21/17 2356 02/22/17 0316 02/22/17 0402 02/22/17 0900  BP: 104/61 111/73 124/71 107/64  Pulse: 96 88 88 88  Resp: 18 20 18 19   Temp: 98.7 F (37.1 C) 98.5 F (36.9 C) 98.1 F (36.7 C) 98.4 F (36.9 C)  TempSrc: Oral Oral Oral Oral  SpO2: 99% 97% 96% 94%  Weight:  78.3 kg (172 lb 9.9 oz)    Height:        Intake/Output Summary (Last 24 hours) at 02/22/17 0959 Last data filed at 02/22/17 0901  Gross per 24 hour  Intake             1380 ml  Output             1075 ml  Net              305 ml   Filed Weights   02/20/17 1723 02/21/17 0400 02/22/17 0316  Weight: 79.3 kg (174 lb 13.2 oz) 77.3 kg (170 lb 6.4 oz) 78.3 kg (172 lb 9.9 oz)    Examination: General exam: Appears calm and comfortable  Respiratory system: Clear to auscultation. Respiratory effort normal. Cardiovascular system: S1 & S2 heard, RRR. No JVD, murmurs, rubs, gallops or clicks. No pedal edema. Gastrointestinal system: Abdomen is nondistended, soft and nontender. No organomegaly or masses felt. Normal bowel sounds heard. Central nervous system: Alert and oriented. No focal neurological deficits. Extremities: Symmetric 5 x 5 power. Skin: No rashes, lesions or ulcers Psychiatry: Judgement and insight appear normal. Mood & affect appropriate.   Data Reviewed: I have personally reviewed following labs and imaging studies  CBC:  Recent Labs Lab 02/20/17 1021 02/21/17 0220 02/21/17 0357 02/22/17 0339  WBC 5.2  --  8.0 7.5  HGB 5.9* 8.8* 8.4* 8.6*  HCT 19.9* 28.1* 27.1* 28.2*  MCV 78.7  --  80.9 82.0    PLT 178  --  176 161   Basic Metabolic Panel:  Recent Labs Lab 02/20/17 1021 02/21/17 0220 02/21/17 0357 02/22/17 0339  NA 134*  --  137 137  K 4.2  --  3.8 3.3*  CL 105  --  105 106  CO2 17*  --  20* 20*  GLUCOSE 135*  --  98 98  BUN 30*  --  28* 22*  CREATININE 1.51*  --  1.42* 1.30*  CALCIUM 8.9  --  8.6* 8.4*  MG  --  2.0  --   --   PHOS  --  3.4  --   --    GFR: Estimated Creatinine Clearance: 49.1 mL/min (A) (by C-G formula based on SCr of 1.3 mg/dL (H)). Liver Function Tests:  Recent Labs Lab 02/20/17 1021  AST 62*  ALT 50  ALKPHOS 74  BILITOT 1.4*  PROT 7.1  ALBUMIN 3.7   No results for input(s): LIPASE, AMYLASE in the last 168 hours. No results for input(s): AMMONIA in the last 168 hours. Coagulation Profile:  Recent Labs Lab 02/20/17 1021  INR  1.53   Cardiac Enzymes:  Recent Labs Lab 02/20/17 1223 02/21/17 0220 02/21/17 0357  TROPONINI 0.24* 0.37* 0.53*   BNP (last 3 results) No results for input(s): PROBNP in the last 8760 hours. HbA1C:  Recent Labs  02/20/17 1022  HGBA1C 5.4   CBG: No results for input(s): GLUCAP in the last 168 hours. Lipid Profile: No results for input(s): CHOL, HDL, LDLCALC, TRIG, CHOLHDL, LDLDIRECT in the last 72 hours. Thyroid Function Tests: No results for input(s): TSH, T4TOTAL, FREET4, T3FREE, THYROIDAB in the last 72 hours. Anemia Panel: No results for input(s): VITAMINB12, FOLATE, FERRITIN, TIBC, IRON, RETICCTPCT in the last 72 hours. Urine analysis:    Component Value Date/Time   COLORURINE AMBER (A) 02/20/2017 1022   APPEARANCEUR HAZY (A) 02/20/2017 1022   APPEARANCEUR Cloudy (A) 02/27/2016 1758   LABSPEC 1.019 02/20/2017 1022   PHURINE 5.0 02/20/2017 1022   GLUCOSEU NEGATIVE 02/20/2017 1022   HGBUR NEGATIVE 02/20/2017 1022   BILIRUBINUR NEGATIVE 02/20/2017 1022   BILIRUBINUR Negative 02/27/2016 1758   KETONESUR NEGATIVE 02/20/2017 1022   PROTEINUR 30 (A) 02/20/2017 1022   UROBILINOGEN 0.2  08/07/2009 0953   NITRITE NEGATIVE 02/20/2017 1022   LEUKOCYTESUR NEGATIVE 02/20/2017 1022   LEUKOCYTESUR Negative 02/27/2016 1758   Sepsis Labs: @LABRCNTIP (procalcitonin:4,lacticidven:4)  ) Recent Results (from the past 240 hour(s))  Surgical pcr screen     Status: None   Collection Time: 02/20/17 10:21 AM  Result Value Ref Range Status   MRSA, PCR NEGATIVE NEGATIVE Final   Staphylococcus aureus NEGATIVE NEGATIVE Final    Comment: (NOTE) The Xpert SA Assay (FDA approved for NASAL specimens in patients 20 years of age and older), is one component of a comprehensive surveillance program. It is not intended to diagnose infection nor to guide or monitor treatment.   Urine Culture     Status: Abnormal   Collection Time: 02/20/17 10:22 AM  Result Value Ref Range Status   Specimen Description URINE, RANDOM  Final   Special Requests NONE  Final   Culture <10,000 COLONIES/mL INSIGNIFICANT GROWTH (A)  Final   Report Status 02/21/2017 FINAL  Final     Invalid input(s): PROCALCITONIN, LACTICACIDVEN   Radiology Studies: Dg Chest 2 View  Result Date: 02/20/2017 CLINICAL DATA:  Aortic valve repair. EXAM: CHEST  2 VIEW COMPARISON:  CT 01/29/2017.  PET-CT 12/02/2016. FINDINGS: Mediastinum and hilar structures are normal. Cardiomegaly with normal pulmonary vascularity. Persistent right upper lobe mass. No pleural effusion or pneumothorax. IMPRESSION: Persistent right upper lobe mass. No interim change from prior studies. Electronically Signed   By: Derry   On: 02/20/2017 13:33        Scheduled Meds: . levothyroxine  37.5 mcg Intravenous Daily  . pantoprazole  40 mg Oral BID  . sodium chloride flush  3 mL Intravenous Q12H   Continuous Infusions: . cefTRIAXone (ROCEPHIN)  IV Stopped (02/21/17 1659)     LOS: 2 days    Time spent: 35 minutes    Birdie Hopes, MD [facility] Pager [phone]  If 7PM-7AM, please contact  night-coverage [email or web address] Password [email or web address] 02/22/2017, 9:59 AM

## 2017-02-22 NOTE — Progress Notes (Signed)
DAILY PROGRESS NOTE   Patient Name: [name] [name] Date of Encounter: 02/22/2017  Hospital Problem List   Principal Problem:   Symptomatic anemia Active Problems:   Atrial fibrillation ([place])   Hyperlipidemia   Hypertension   Severe aortic stenosis   Lesion of right lung   Acute kidney injury superimposed on chronic kidney disease (HCC)   Elevated troponin   Cirrhosis (HCC)   BPH (benign prostatic hyperplasia)   Abnormal urinalysis   Acute diastolic heart failure ([place])   Melena   Multiple gastric ulcers    Chief Complaint   No complaints  Subjective   EGD yesterday shows gastric ulcers, non-bleeding. Likely source of bleed. Work-up for H. Pylori underway. Creatinine improving. H/H stable. No chest pain. Plan for TAVR on Tuesday.  Objective   Vitals:   02/21/17 2356 02/22/17 0316 02/22/17 0402 02/22/17 0900  BP: 104/61 111/73 124/71 107/64  Pulse: 96 88 88 88  Resp: 18 20 18 19   Temp: 98.7 F (37.1 C) 98.5 F (36.9 C) 98.1 F (36.7 C) 98.4 F (36.9 C)  TempSrc: Oral Oral Oral Oral  SpO2: 99% 97% 96% 94%  Weight:  172 lb 9.9 oz (78.3 kg)    Height:        Intake/Output Summary (Last 24 hours) at 02/22/17 1055 Last data filed at 02/22/17 0901  Gross per 24 hour  Intake             1380 ml  Output             1075 ml  Net              305 ml   Filed Weights   02/20/17 1723 02/21/17 0400 02/22/17 0316  Weight: 174 lb 13.2 oz (79.3 kg) 170 lb 6.4 oz (77.3 kg) 172 lb 9.9 oz (78.3 kg)    Physical Exam   General appearance: alert, no distress and feels well Neck: no carotid bruit, no JVD and thyroid not enlarged, symmetric, no tenderness/mass/nodules Lungs: clear to auscultation bilaterally Heart: irregularly irregular rhythm Abdomen: soft, non-tender; bowel sounds normal; no masses,  no organomegaly Extremities: extremities normal, atraumatic, no cyanosis or edema Pulses: 2+ and symmetric Skin: pale, warm, dry Neurologic: Grossly normal Psych:  Pleasant  Inpatient Medications    Scheduled Meds: . levothyroxine  37.5 mcg Intravenous Daily  . pantoprazole  40 mg Oral BID  . sodium chloride flush  3 mL Intravenous Q12H    Continuous Infusions: . cefTRIAXone (ROCEPHIN)  IV Stopped (02/21/17 1659)    PRN Meds: acetaminophen **OR** acetaminophen, cyclobenzaprine, ondansetron **OR** ondansetron (ZOFRAN) IV   Labs   Results for orders placed or performed during the hospital encounter of 02/20/17 (from the past 48 hour(s))  Prepare RBC     Status: None   Collection Time: 02/20/17 11:51 AM  Result Value Ref Range   Order Confirmation ORDER PROCESSED BY BLOOD BANK   Type and screen     Status: None   Collection Time: 02/20/17 12:23 PM  Result Value Ref Range   ABO/RH(D) A POS    Antibody Screen NEG    Sample Expiration 02/23/2017    Unit Number [id number]    Blood Component Type RED CELLS,LR    Unit division 00    Status of Unit ISSUED,FINAL    Transfusion Status OK TO TRANSFUSE    Crossmatch Result Compatible    Unit Number [id number]    Blood Component Type RED CELLS,LR    Unit division  00    Status of Unit ISSUED,FINAL    Transfusion Status OK TO TRANSFUSE    Crossmatch Result Compatible   Troponin I     Status: Abnormal   Collection Time: 02/20/17 12:23 PM  Result Value Ref Range   Troponin I 0.24 (HH) <0.03 ng/mL    Comment: CRITICAL RESULT CALLED TO, READ BACK BY AND VERIFIED WITH: B.MILLER,RN 02/20/17 1330 BY BSLADE   Troponin I (q 6hr x 3)     Status: Abnormal   Collection Time: 02/21/17  2:20 AM  Result Value Ref Range   Troponin I 0.37 (HH) <0.03 ng/mL    Comment: CRITICAL VALUE NOTED.  VALUE IS CONSISTENT WITH PREVIOUSLY REPORTED AND CALLED VALUE.  Magnesium     Status: None   Collection Time: 02/21/17  2:20 AM  Result Value Ref Range   Magnesium 2.0 1.7 - 2.4 mg/dL  Phosphorus     Status: None   Collection Time: 02/21/17  2:20 AM  Result Value Ref Range   Phosphorus 3.4 2.5 - 4.6 mg/dL    Hemoglobin and hematocrit, blood     Status: Abnormal   Collection Time: 02/21/17  2:20 AM  Result Value Ref Range   Hemoglobin 8.8 (L) 13.0 - 17.0 g/dL    Comment: POST TRANSFUSION SPECIMEN   HCT 28.1 (L) 39.0 - 52.0 %  CBC     Status: Abnormal   Collection Time: 02/21/17  3:57 AM  Result Value Ref Range   WBC 8.0 4.0 - 10.5 K/uL   RBC 3.35 (L) 4.22 - 5.81 MIL/uL   Hemoglobin 8.4 (L) 13.0 - 17.0 g/dL   HCT 27.1 (L) 39.0 - 52.0 %   MCV 80.9 78.0 - 100.0 fL   MCH 25.1 (L) 26.0 - 34.0 pg   MCHC 31.0 30.0 - 36.0 g/dL   RDW 18.5 (H) 11.5 - 15.5 %   Platelets 176 150 - 400 K/uL  Troponin I (q 6hr x 3)     Status: Abnormal   Collection Time: 02/21/17  3:57 AM  Result Value Ref Range   Troponin I 0.53 (HH) <0.03 ng/mL    Comment: CRITICAL VALUE NOTED.  VALUE IS CONSISTENT WITH PREVIOUSLY REPORTED AND CALLED VALUE.  Basic metabolic panel     Status: Abnormal   Collection Time: 02/21/17  3:57 AM  Result Value Ref Range   Sodium 137 135 - 145 mmol/L   Potassium 3.8 3.5 - 5.1 mmol/L   Chloride 105 101 - 111 mmol/L   CO2 20 (L) 22 - 32 mmol/L   Glucose, Bld 98 65 - 99 mg/dL   BUN 28 (H) 6 - 20 mg/dL   Creatinine, Ser 1.42 (H) 0.61 - 1.24 mg/dL   Calcium 8.6 (L) 8.9 - 10.3 mg/dL   GFR calc non Af Amer 46 (L) >60 mL/min   GFR calc Af Amer 53 (L) >60 mL/min    Comment: (NOTE) The eGFR has been calculated using the CKD EPI equation. This calculation has not been validated in all clinical situations. eGFR's persistently <60 mL/min signify possible Chronic Kidney Disease.    Anion gap 12 5 - 15  Basic metabolic panel     Status: Abnormal   Collection Time: 02/22/17  3:39 AM  Result Value Ref Range   Sodium 137 135 - 145 mmol/L   Potassium 3.3 (L) 3.5 - 5.1 mmol/L   Chloride 106 101 - 111 mmol/L   CO2 20 (L) 22 - 32 mmol/L   Glucose, Bld 98  65 - 99 mg/dL   BUN 22 (H) 6 - 20 mg/dL   Creatinine, Ser 1.30 (H) 0.61 - 1.24 mg/dL   Calcium 8.4 (L) 8.9 - 10.3 mg/dL   GFR calc non Af  Amer 51 (L) >60 mL/min   GFR calc Af Amer 59 (L) >60 mL/min    Comment: (NOTE) The eGFR has been calculated using the CKD EPI equation. This calculation has not been validated in all clinical situations. eGFR's persistently <60 mL/min signify possible Chronic Kidney Disease.    Anion gap 11 5 - 15  CBC     Status: Abnormal   Collection Time: 02/22/17  3:39 AM  Result Value Ref Range   WBC 7.5 4.0 - 10.5 K/uL   RBC 3.44 (L) 4.22 - 5.81 MIL/uL   Hemoglobin 8.6 (L) 13.0 - 17.0 g/dL   HCT 28.2 (L) 39.0 - 52.0 %   MCV 82.0 78.0 - 100.0 fL   MCH 25.0 (L) 26.0 - 34.0 pg   MCHC 30.5 30.0 - 36.0 g/dL   RDW 19.1 (H) 11.5 - 15.5 %   Platelets 171 150 - 400 K/uL    ECG   N/A  Telemetry   A-fib with CVR - Personally Reviewed  Radiology    No results found.  Cardiac Studies   N/A  Assessment   Principal Problem:   Symptomatic anemia Active Problems:   Atrial fibrillation (HCC)   Hyperlipidemia   Hypertension   Severe aortic stenosis   Lesion of right lung   Acute kidney injury superimposed on chronic kidney disease (HCC)   Elevated troponin   Cirrhosis (HCC)   BPH (benign prostatic hyperplasia)   Abnormal urinalysis   Acute diastolic heart failure (HCC)   Melena   Multiple gastric ulcers   Plan   1. Source of GI bleeding was likely gastric ulcers. This can be treated and I suspect he will still proceed with TAVR in 2 days. I would keep him off of anticoagulation for now - he will need to be on anti-platelets after TAVR. No further cardiac suggestions at this time.   Cardiology will sign-off. Call with questions.  Time Spent Directly with Patient:  I have spent a total of 15 minutes with the patient reviewing hospital notes, telemetry, EKGs, labs and examining the patient as well as establishing an assessment and plan that was discussed personally with the patient. > 50% of time was spent in direct patient care.  Length of Stay:  LOS: 2 days   Pixie Casino, MD, [place]  Attending Cardiologist  Direct Dial: [phone]  Fax: [phone]  Website:  [email or web address] 02/22/2017, 10:55 AM

## 2017-02-23 ENCOUNTER — Inpatient Hospital Stay (HOSPITAL_COMMUNITY): Payer: Medicare Other

## 2017-02-23 LAB — BASIC METABOLIC PANEL
ANION GAP: 6 (ref 5–15)
BUN: 18 mg/dL (ref 6–20)
CHLORIDE: 109 mmol/L (ref 101–111)
CO2: 25 mmol/L (ref 22–32)
Calcium: 8.3 mg/dL — ABNORMAL LOW (ref 8.9–10.3)
Creatinine, Ser: 1.22 mg/dL (ref 0.61–1.24)
GFR calc non Af Amer: 55 mL/min — ABNORMAL LOW (ref >60)
Glucose, Bld: 100 mg/dL — ABNORMAL HIGH (ref 65–99)
POTASSIUM: 3.6 mmol/L (ref 3.5–5.1)
Sodium: 140 mmol/L (ref 135–145)

## 2017-02-23 LAB — HEPATITIS B SURFACE ANTIGEN: Hepatitis B Surface Ag: NEGATIVE

## 2017-02-23 LAB — CBC
HCT: 28.6 % — ABNORMAL LOW (ref 39.0–52.0)
HEMOGLOBIN: 8.5 g/dL — AB (ref 13.0–17.0)
MCH: 24.8 pg — AB (ref 26.0–34.0)
MCHC: 29.7 g/dL — ABNORMAL LOW (ref 30.0–36.0)
MCV: 83.4 fL (ref 78.0–100.0)
Platelets: 181 10*3/uL (ref 150–400)
RBC: 3.43 MIL/uL — AB (ref 4.22–5.81)
RDW: 19.6 % — ABNORMAL HIGH (ref 11.5–15.5)
WBC: 7.2 10*3/uL (ref 4.0–10.5)

## 2017-02-23 LAB — ANTI-SMOOTH MUSCLE ANTIBODY, IGG: F-Actin IgG: 7 Units (ref 0–19)

## 2017-02-23 LAB — MITOCHONDRIAL ANTIBODIES: Mitochondrial M2 Ab, IgG: 3.4 Units (ref 0.0–20.0)

## 2017-02-23 LAB — HEPATITIS B SURFACE ANTIBODY,QUALITATIVE: Hep B S Ab: REACTIVE

## 2017-02-23 LAB — HEPATITIS C ANTIBODY: HCV Ab: <0.1 s/co ratio (ref 0.0–0.9)

## 2017-02-23 MED ORDER — MORPHINE SULFATE (PF) 4 MG/ML IV SOLN
4.0000 mg | Freq: Once | INTRAVENOUS | Status: AC
  Administered 2017-02-23: 4 mg via INTRAVENOUS
  Filled 2017-02-23: qty 1

## 2017-02-23 MED ORDER — DEXMEDETOMIDINE HCL IN NACL 400 MCG/100ML IV SOLN
0.1000 ug/kg/h | INTRAVENOUS | Status: DC
  Filled 2017-02-23 (×2): qty 100

## 2017-02-23 MED ORDER — CHLORHEXIDINE GLUCONATE 4 % EX LIQD
60.0000 mL | Freq: Once | CUTANEOUS | Status: AC
  Administered 2017-02-24: 4 via TOPICAL
  Filled 2017-02-23: qty 15

## 2017-02-23 MED ORDER — DIAZEPAM 2 MG PO TABS
2.0000 mg | ORAL_TABLET | Freq: Once | ORAL | Status: AC
  Administered 2017-02-24: 2 mg via ORAL
  Filled 2017-02-23: qty 1

## 2017-02-23 MED ORDER — NOREPINEPHRINE BITARTRATE 1 MG/ML IV SOLN
0.0000 ug/min | INTRAVENOUS | Status: DC
  Filled 2017-02-23 (×2): qty 4

## 2017-02-23 MED ORDER — SODIUM CHLORIDE 0.9 % IV SOLN
30.0000 ug/min | INTRAVENOUS | Status: DC
Start: 2017-02-24 — End: 2017-02-24
  Filled 2017-02-23 (×2): qty 2

## 2017-02-23 MED ORDER — POTASSIUM CHLORIDE CRYS ER 20 MEQ PO TBCR
40.0000 meq | EXTENDED_RELEASE_TABLET | Freq: Once | ORAL | Status: AC
  Administered 2017-02-23: 40 meq via ORAL
  Filled 2017-02-23: qty 2

## 2017-02-23 MED ORDER — BISACODYL 5 MG PO TBEC
5.0000 mg | DELAYED_RELEASE_TABLET | Freq: Once | ORAL | Status: AC
  Administered 2017-02-23: 5 mg via ORAL
  Filled 2017-02-23: qty 1

## 2017-02-23 MED ORDER — NITROGLYCERIN IN D5W 200-5 MCG/ML-% IV SOLN
2.0000 ug/min | INTRAVENOUS | Status: DC
  Filled 2017-02-23 (×2): qty 250

## 2017-02-23 MED ORDER — DEXTROSE 5 % IV SOLN
0.0000 ug/min | INTRAVENOUS | Status: DC
  Filled 2017-02-23 (×2): qty 4

## 2017-02-23 MED ORDER — CHLORHEXIDINE GLUCONATE 0.12 % MT SOLN
15.0000 mL | Freq: Once | OROMUCOSAL | Status: AC
  Administered 2017-02-24: 15 mL via OROMUCOSAL
  Filled 2017-02-23: qty 15

## 2017-02-23 MED ORDER — MAGNESIUM SULFATE 50 % IJ SOLN
40.0000 meq | INTRAMUSCULAR | Status: DC
Start: 2017-02-24 — End: 2017-02-24
  Filled 2017-02-23 (×2): qty 10

## 2017-02-23 MED ORDER — DOPAMINE-DEXTROSE 3.2-5 MG/ML-% IV SOLN
0.0000 ug/kg/min | INTRAVENOUS | Status: DC
  Filled 2017-02-23: qty 250

## 2017-02-23 MED ORDER — CHLORHEXIDINE GLUCONATE 4 % EX LIQD: 30.0000 mL | CUTANEOUS | Status: DC

## 2017-02-23 MED ORDER — CEFUROXIME SODIUM 1.5 G IV SOLR
1.5000 g | INTRAVENOUS | Status: DC
  Filled 2017-02-23: qty 1.5

## 2017-02-23 MED ORDER — FUROSEMIDE 10 MG/ML IJ SOLN
40.0000 mg | Freq: Once | INTRAMUSCULAR | Status: AC
  Administered 2017-02-23: 40 mg via INTRAVENOUS
  Filled 2017-02-23: qty 4

## 2017-02-23 MED ORDER — TEMAZEPAM 15 MG PO CAPS: 15.0000 mg | ORAL_CAPSULE | Freq: Once | ORAL | Status: DC | PRN

## 2017-02-23 MED ORDER — POTASSIUM CHLORIDE 2 MEQ/ML IV SOLN
80.0000 meq | INTRAVENOUS | Status: DC
  Filled 2017-02-23 (×2): qty 40

## 2017-02-23 MED ORDER — LEVOTHYROXINE SODIUM 75 MCG PO TABS
75.0000 ug | ORAL_TABLET | Freq: Every day | ORAL | Status: DC
  Administered 2017-02-25: 75 ug via ORAL
  Filled 2017-02-23 (×3): qty 1

## 2017-02-23 MED ORDER — INSULIN REGULAR HUMAN 100 UNIT/ML IJ SOLN
INTRAMUSCULAR | Status: DC
  Filled 2017-02-23 (×3): qty 1

## 2017-02-23 MED ORDER — CHLORHEXIDINE GLUCONATE CLOTH 2 % EX PADS
6.0000 | MEDICATED_PAD | Freq: Once | CUTANEOUS | Status: AC
  Administered 2017-02-23: 6 via TOPICAL

## 2017-02-23 MED ORDER — CHLORHEXIDINE GLUCONATE 0.12 % MT SOLN
15.0000 mL | Freq: Once | OROMUCOSAL | Status: AC
  Administered 2017-02-24: 15 mL via OROMUCOSAL

## 2017-02-23 MED ORDER — CHLORHEXIDINE GLUCONATE CLOTH 2 % EX PADS: 6.0000 | MEDICATED_PAD | Freq: Once | CUTANEOUS | Status: AC

## 2017-02-23 MED ORDER — SODIUM CHLORIDE 0.9 % IV SOLN: INTRAVENOUS | Status: DC

## 2017-02-23 MED ORDER — VANCOMYCIN HCL 10 G IV SOLR
1250.0000 mg | INTRAVENOUS | Status: DC
  Filled 2017-02-23 (×2): qty 1250

## 2017-02-23 MED ORDER — CHLORHEXIDINE GLUCONATE 4 % EX LIQD
60.0000 mL | Freq: Once | CUTANEOUS | Status: AC
  Administered 2017-02-23: 4 via TOPICAL
  Filled 2017-02-23: qty 15

## 2017-02-23 MED ORDER — SODIUM CHLORIDE 0.9 % IV SOLN
INTRAVENOUS | Status: DC
  Filled 2017-02-23 (×2): qty 30

## 2017-02-23 MED ORDER — METOPROLOL TARTRATE 12.5 MG HALF TABLET
12.5000 mg | ORAL_TABLET | Freq: Once | ORAL | Status: AC
  Administered 2017-02-24: 12.5 mg via ORAL
  Filled 2017-02-23: qty 1

## 2017-02-23 NOTE — Progress Notes (Signed)
PROGRESS NOTE  [name]  KCL:[id number] DOB: 09/02/1940 DOA: 02/20/2017 PCP: [name], FNP Outpatient Specialists:  Subjective: O2 sats in the upper 90s on 4 L of oxygen, complaining about hip pain overnight. TAVR scheduled tomorrow morning.  Brief Narrative:  [name] [name] is a 76 y.o. male with medical history significant for hypertension, dyslipidemia, known severe aortic stenosis with TAVR procedure pending on 9/4, lesion of right lung suspicious for possible malignancy, BPH, new diagnosis of atrial fibrillation recently started on eliquis and mild stage III chronic kidney disease. He reports chronic dyspnea on exertion but for the past week this has been worse than baseline. He has had dark stools for 2 weeks. He reports 2 days after starting eliquis he developed indigestion. He reported this recently to the cardiologist who has took him off eliquis 2 days ago. He presented to this facility for preoperative lab work and was found to have a hemoglobin of 5.9 so he was sent to the ER for further evaluation and treatment. He was also found to have a mild acute kidney injury with BUN 30 creatinine 1.51 with a mild non-and an gap acidosis. In the ER his blood pressure readings have been suboptimal mainly in the 49-449 systolic range but during my examination patient is blood pressure had dropped to 85. He has been evaluated by cardiology and GI with recommendations made. 2 units packed red blood cells are pending.  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)   Elevated troponin   Cirrhosis (HCC)   BPH (benign prostatic hyperplasia)   Abnormal urinalysis   Acute diastolic heart failure (HCC)   Melena   Multiple gastric ulcers     Symptomatic  anemia -Presents with worsened from baseline dyspnea on exertion 1 week and was found to have significant anemia with a hemoglobin of 5.9 with baseline hemoglobin 9.22 December 2016 -Patient also with suboptimal blood pressure readings and during my evaluation I repeated blood pressure and he was hypotensive with a systolic blood pressure of 85 -Agree with 2 units PRBCs-obtain CBC after second unit and likely will require additional transfusions over the next 24-36 hours. -Continue Protonix 40 mg IV every 12 hours, switch to oral after surgery.  Hypoxia -Patient requires 4 L of oxygen to keep oxygen saturation above 90%. -Likely multifactorial secondary to anemia and fluid overload, I'll give 1 dose of Lasix today.    Elevated troponin  -Likely demand ischemia from hypoxia and hypotension, has known 60% LAD stenosis. -Seen by cardiology, recommended to continue medical treatment. No aspirin because of concurrent bleeding.    Acute kidney injury superimposed on chronic kidney disease  -Baseline renal function 17/1.2 -Current renal function 30/1.51 -Check BMP in a.m.    Atrial fibrillation  -Patient reports newly diagnosed August 15 with subsequent initiation of eliquis -Currently rate controlled not on AVN blocking agents or antiarrhythmics -Hold anticoagulation/eliquis insetting of symptomatic anemia and suspected GI bleeding -Appreciate cardiology assistance    Severe aortic stenosis -Has chronic dyspnea on exertion -TAVR tentatively planned for 9/4    Lesion of right lung -Suspicious for bronchogenic malignancy -More aggressive evaluation/workup after recovers from TAVR    Cirrhosis  -Incidental finding early cirrhosis on CT scan August 2018 -GI has ordered serology/autoimmune markers  Abnormal urinalysis -Concerning for possible UTI -Urine culture -Empiric Rocephin    Hyperlipidemia -Hold preadmission Lipitor and omega-3 fatty acids    Hypertension -Current  blood pressure reading suboptimal so holding preadmission Norvasc and lisinopril    BPH (benign prostatic hyperplasia) -Hold Uroxatral until diet allowed    Hypothyroidism -Continue Synthroid but convert to IV   Discussed briefly with Dr. Cyndia Bent of cardiovascular surgery, patient will have surgery in a.m. TCTS will take over after surgery, patient will probably spend some time in the ICU.   DVT prophylaxis: SCDs Code Status: Full Code Family Communication:  Disposition Plan:  Diet: Diet Heart Room service appropriate? Yes; Fluid consistency: Thin  Consultants:   GI   Cards  Procedures:   EGD showed gastric ulcer without stigmata of bleeding.  Antimicrobials:   None  Objective: Vitals:   02/22/17 2334 02/23/17 0358 02/23/17 0700 02/23/17 0739  BP: (!) 117/58 117/88  132/68  Pulse: 89 (!) 113  82  Resp: 17 14  12   Temp: 97.7 F (36.5 C) 98 F (36.7 C)  98 F (36.7 C)  TempSrc: Oral Oral  Oral  SpO2: 91% 97%  97%  Weight:   78.4 kg (172 lb 13.5 oz)   Height:        Intake/Output Summary (Last 24 hours) at 02/23/17 1136 Last data filed at 02/23/17 0823  Gross per 24 hour  Intake              410 ml  Output              725 ml  Net             -315 ml   Filed Weights   02/21/17 0400 02/22/17 0316 02/23/17 0700  Weight: 77.3 kg (170 lb 6.4 oz) 78.3 kg (172 lb 9.9 oz) 78.4 kg (172 lb 13.5 oz)    Examination: General exam: Appears calm and comfortable  Respiratory system: Clear to auscultation. Respiratory effort normal. Cardiovascular system: S1 & S2 heard, RRR. No JVD, murmurs, rubs, gallops or clicks. No pedal edema. Gastrointestinal system: Abdomen is nondistended, soft and nontender. No organomegaly or masses felt. Normal bowel sounds heard. Central nervous system: Alert and oriented. No focal neurological deficits. Extremities: Symmetric 5 x 5 power. Skin: No rashes, lesions or ulcers Psychiatry: Judgement and insight appear normal. Mood & affect  appropriate.   Data Reviewed: I have personally reviewed following labs and imaging studies  CBC:  Recent Labs Lab 02/20/17 1021 02/21/17 0220 02/21/17 0357 02/22/17 0339 02/23/17 0415  WBC 5.2  --  8.0 7.5 7.2  HGB 5.9* 8.8* 8.4* 8.6* 8.5*  HCT 19.9* 28.1* 27.1* 28.2* 28.6*  MCV 78.7  --  80.9 82.0 83.4  PLT 178  --  176 171 400   Basic Metabolic Panel:  Recent Labs Lab 02/20/17 1021 02/21/17 0220 02/21/17 0357 02/22/17 0339 02/23/17 0415  NA 134*  --  137 137 140  K 4.2  --  3.8 3.3* 3.6  CL 105  --  105 106 109  CO2 17*  --  20* 20* 25  GLUCOSE 135*  --  98 98 100*  BUN 30*  --  28* 22* 18  CREATININE 1.51*  --  1.42* 1.30* 1.22  CALCIUM 8.9  --  8.6* 8.4* 8.3*  MG  --  2.0  --   --   --   PHOS  --  3.4  --   --   --  GFR: Estimated Creatinine Clearance: 52.4 mL/min (by C-G formula based on SCr of 1.22 mg/dL). Liver Function Tests:  Recent Labs Lab 02/20/17 1021  AST 62*  ALT 50  ALKPHOS 74  BILITOT 1.4*  PROT 7.1  ALBUMIN 3.7   No results for input(s): LIPASE, AMYLASE in the last 168 hours. No results for input(s): AMMONIA in the last 168 hours. Coagulation Profile:  Recent Labs Lab 02/20/17 1021  INR 1.53   Cardiac Enzymes:  Recent Labs Lab 02/20/17 1223 02/21/17 0220 02/21/17 0357  TROPONINI 0.24* 0.37* 0.53*   BNP (last 3 results) No results for input(s): PROBNP in the last 8760 hours. HbA1C: No results for input(s): HGBA1C in the last 72 hours. CBG: No results for input(s): GLUCAP in the last 168 hours. Lipid Profile: No results for input(s): CHOL, HDL, LDLCALC, TRIG, CHOLHDL, LDLDIRECT in the last 72 hours. Thyroid Function Tests: No results for input(s): TSH, T4TOTAL, FREET4, T3FREE, THYROIDAB in the last 72 hours. Anemia Panel: No results for input(s): VITAMINB12, FOLATE, FERRITIN, TIBC, IRON, RETICCTPCT in the last 72 hours. Urine analysis:    Component Value Date/Time   COLORURINE AMBER (A) 02/20/2017 1022    APPEARANCEUR HAZY (A) 02/20/2017 1022   APPEARANCEUR Cloudy (A) 02/27/2016 1758   LABSPEC 1.019 02/20/2017 1022   PHURINE 5.0 02/20/2017 1022   GLUCOSEU NEGATIVE 02/20/2017 1022   HGBUR NEGATIVE 02/20/2017 1022   BILIRUBINUR NEGATIVE 02/20/2017 1022   BILIRUBINUR Negative 02/27/2016 1758   KETONESUR NEGATIVE 02/20/2017 1022   PROTEINUR 30 (A) 02/20/2017 1022   UROBILINOGEN 0.2 08/07/2009 0953   NITRITE NEGATIVE 02/20/2017 1022   LEUKOCYTESUR NEGATIVE 02/20/2017 1022   LEUKOCYTESUR Negative 02/27/2016 1758   Sepsis Labs: @LABRCNTIP (procalcitonin:4,lacticidven:4)  ) Recent Results (from the past 240 hour(s))  Surgical pcr screen     Status: None   Collection Time: 02/20/17 10:21 AM  Result Value Ref Range Status   MRSA, PCR NEGATIVE NEGATIVE Final   Staphylococcus aureus NEGATIVE NEGATIVE Final    Comment: (NOTE) The Xpert SA Assay (FDA approved for NASAL specimens in patients 42 years of age and older), is one component of a comprehensive surveillance program. It is not intended to diagnose infection nor to guide or monitor treatment.   Urine Culture     Status: Abnormal   Collection Time: 02/20/17 10:22 AM  Result Value Ref Range Status   Specimen Description URINE, RANDOM  Final   Special Requests NONE  Final   Culture <10,000 COLONIES/mL INSIGNIFICANT GROWTH (A)  Final   Report Status 02/21/2017 FINAL  Final     Invalid input(s): PROCALCITONIN, LACTICACIDVEN   Radiology Studies: Dg Hip Unilat With Pelvis 2-3 Views Right  Result Date: 02/23/2017 CLINICAL DATA:  Right hip pain for 1 day. EXAM: DG HIP (WITH OR WITHOUT PELVIS) 2-3V RIGHT COMPARISON:  Abdominal CT 01/29/2017 FINDINGS: The cortical margins of the bony pelvis and right hip are intact. No fracture. Pubic symphysis and sacroiliac joints are congruent. Both femoral heads are well-seated in the respective acetabula. No radiographic evidence of focal lesion. Vascular calcification in bi-iliac graft in place. A  dressing overlies the lateral hip. IMPRESSION: No acute abnormality or explanation for right hip pain. Electronically Signed   By: Jeb Levering M.D.   On: 02/23/2017 03:27        Scheduled Meds: . bisacodyl  5 mg Oral Once  . [START ON 02/24/2017] diazepam  2 mg Oral Once  . levothyroxine  37.5 mcg Intravenous Daily  . [START ON 02/24/2017] metoprolol  tartrate  12.5 mg Oral Once  . pantoprazole  40 mg Oral BID  . sodium chloride flush  3 mL Intravenous Q12H   Continuous Infusions: . cefTRIAXone (ROCEPHIN)  IV Stopped (02/22/17 1640)     LOS: 3 days    Time spent: 35 minutes    Birdie Hopes, MD [facility] Pager [phone]  If 7PM-7AM, please contact night-coverage [email or web address] Password [email or web address] 02/23/2017, 11:36 AM

## 2017-02-23 NOTE — Progress Notes (Signed)
2 Days Post-Op Procedure(s) (LRB): ESOPHAGOGASTRODUODENOSCOPY (EGD) (N/A) Subjective:  Complains of hip pain overnight, couldn't get comfortable. Has been at bed rest for several days for some reason.  Objective: Vital signs in last 24 hours: Temp:  [97.7 F (36.5 C)-98.2 F (36.8 C)] 98 F (36.7 C) (09/03 0358) Pulse Rate:  [89-113] 113 (09/03 0358) Cardiac Rhythm: Atrial fibrillation (09/03 0700) Resp:  [14-19] 14 (09/03 0358) BP: (108-117)/(58-88) 117/88 (09/03 0358) SpO2:  [91 %-97 %] 97 % (09/03 0358) Weight:  [78.4 kg (172 lb 13.5 oz)] 78.4 kg (172 lb 13.5 oz) (09/03 0700)  Hemodynamic parameters for last 24 hours:    Intake/Output from previous day: 09/02 0701 - 09/03 0700 In: 650 [P.O.:600; IV Piggyback:50] Out: 1050 [Urine:1050] Intake/Output this shift: Total I/O In: -  Out: 100 [Urine:100]    Lab Results:  Recent Labs  02/22/17 0339 02/23/17 0415  WBC 7.5 7.2  HGB 8.6* 8.5*  HCT 28.2* 28.6*  PLT 171 181   BMET:  Recent Labs  02/22/17 0339 02/23/17 0415  NA 137 140  K 3.3* 3.6  CL 106 109  CO2 20* 25  GLUCOSE 98 100*  BUN 22* 18  CREATININE 1.30* 1.22  CALCIUM 8.4* 8.3*    PT/INR:  Recent Labs  02/20/17 1021  LABPROT 18.3*  INR 1.53   ABG    Component Value Date/Time   PHART 7.452 (H) 02/20/2017 1022   HCO3 18.5 (L) 02/20/2017 1022   TCO2 24 01/14/2017 1115   TCO2 23 01/14/2017 1115   ACIDBASEDEF 4.8 (H) 02/20/2017 1022   O2SAT 97.9 02/20/2017 1022   CBG (last 3)  No results for input(s): GLUCAP in the last 72 hours.  Assessment/Plan:  Severe symptomatic AS. Plan TAVR tomorrow. He needs to get up to chair and ambulate today. His Hgb is stable at 8.5. He may require a unit of PRBC's during or after surgery at this level so will type and cross. He has no further questions.   LOS: 3 days    Gaye Pollack 02/23/2017

## 2017-02-23 NOTE — Progress Notes (Signed)
Patient complained of severe right hip pain again. Norco 2 tabs given, continue with K-pad, and hot packs. Call K.Schorr since pain seems to be getting worse. Ordered an x-ray. Worried patient won't be able to go for TAVR on Tuesday because he can't lay flat or still. Will continue to monitor closely.

## 2017-02-23 NOTE — Progress Notes (Signed)
Patient complaining of 8/10 right hip pain. Patient states he uses Bio Freeze at home. Called K.Schorr and received an order for Muscle rub cream. Will continue to monitor effectiveness.

## 2017-02-23 NOTE — Progress Notes (Signed)
Patient returned from X-ray 

## 2017-02-23 NOTE — Progress Notes (Signed)
Progress Note  Patient Name: [name] [name] Date of Encounter: 02/23/2017  Primary Cardiologist: Dr Bronson Ing  Subjective   No chest pain or shortness of breath. Wife at bedside. Feels much better than when he was admitted to the hospital.   Inpatient Medications    Scheduled Meds: . levothyroxine  37.5 mcg Intravenous Daily  . pantoprazole  40 mg Oral BID  . sodium chloride flush  3 mL Intravenous Q12H   Continuous Infusions: . cefTRIAXone (ROCEPHIN)  IV Stopped (02/22/17 1640)   PRN Meds: acetaminophen **OR** acetaminophen, cyclobenzaprine, HYDROcodone-acetaminophen, MUSCLE RUB, ondansetron **OR** ondansetron (ZOFRAN) IV   Vital Signs    Vitals:   02/22/17 2006 02/22/17 2334 02/23/17 0358 02/23/17 0700  BP: 113/66 (!) 117/58 117/88   Pulse: (!) 104 89 (!) 113   Resp: 19 17 14    Temp: 98.2 F (36.8 C) 97.7 F (36.5 C) 98 F (36.7 C)   TempSrc: Oral Oral Oral   SpO2: 91% 91% 97%   Weight:    172 lb 13.5 oz (78.4 kg)  Height:        Intake/Output Summary (Last 24 hours) at 02/23/17 0859 Last data filed at 02/23/17 3149  Gross per 24 hour  Intake              650 ml  Output             1150 ml  Net             -500 ml   Filed Weights   02/21/17 0400 02/22/17 0316 02/23/17 0700  Weight: 170 lb 6.4 oz (77.3 kg) 172 lb 9.9 oz (78.3 kg) 172 lb 13.5 oz (78.4 kg)    Telemetry    Atrial fibrillation with frequent PVC's - Personally Reviewed  ECG    Atrial fibrillation, resolution of marked ST-T changes noted - Personally Reviewed  Physical Exam  Alert, oriented, elderly male, in NAD GEN: No acute distress.   Neck: No JVD Cardiac: irregular with 2/6 harsh systolic murmur at the RUSB.  Respiratory: Clear to auscultation bilaterally. GI: Soft, nontender, non-distended  MS: No edema; No deformity. Neuro:  Nonfocal  Psych: Normal affect   Labs    Chemistry Recent Labs Lab 02/20/17 1021 02/21/17 0357 02/22/17 0339 02/23/17 0415  NA 134* 137 137  140  K 4.2 3.8 3.3* 3.6  CL 105 105 106 109  CO2 17* 20* 20* 25  GLUCOSE 135* 98 98 100*  BUN 30* 28* 22* 18  CREATININE 1.51* 1.42* 1.30* 1.22  CALCIUM 8.9 8.6* 8.4* 8.3*  PROT 7.1  --   --   --   ALBUMIN 3.7  --   --   --   AST 62*  --   --   --   ALT 50  --   --   --   ALKPHOS 74  --   --   --   BILITOT 1.4*  --   --   --   GFRNONAA 43* 46* 51* 55*  GFRAA 50* 53* 59* >60  ANIONGAP 12 12 11 6      Hematology Recent Labs Lab 02/21/17 0357 02/22/17 0339 02/23/17 0415  WBC 8.0 7.5 7.2  RBC 3.35* 3.44* 3.43*  HGB 8.4* 8.6* 8.5*  HCT 27.1* 28.2* 28.6*  MCV 80.9 82.0 83.4  MCH 25.1* 25.0* 24.8*  MCHC 31.0 30.5 29.7*  RDW 18.5* 19.1* 19.6*  PLT 176 171 181    Cardiac Enzymes Recent Labs Lab 02/20/17 1223  02/21/17 0220 02/21/17 0357  TROPONINI 0.24* 0.37* 0.53*   No results for input(s): TROPIPOC in the last 168 hours.   BNPNo results for input(s): BNP, PROBNP in the last 168 hours.   DDimer No results for input(s): DDIMER in the last 168 hours.   Radiology    Dg Hip Unilat With Pelvis 2-3 Views Right  Result Date: 02/23/2017 CLINICAL DATA:  Right hip pain for 1 day. EXAM: DG HIP (WITH OR WITHOUT PELVIS) 2-3V RIGHT COMPARISON:  Abdominal CT 01/29/2017 FINDINGS: The cortical margins of the bony pelvis and right hip are intact. No fracture. Pubic symphysis and sacroiliac joints are congruent. Both femoral heads are well-seated in the respective acetabula. No radiographic evidence of focal lesion. Vascular calcification in bi-iliac graft in place. A dressing overlies the lateral hip. IMPRESSION: No acute abnormality or explanation for right hip pain. Electronically Signed   By: Jeb Levering M.D.   On: 02/23/2017 03:27     Patient Profile     76 y.o. male with severe symptomatic aortic stenosis admitted with symptomatic anemia related to UGI bleed on anticoagulation for atrial fibrillation  Assessment & Plan    1. Acute blood loss anemia: EGD results reviewed.  Gastric ulcers without active bleeding identified. Patient now off anticoagulation with stable hemoglobin after blood transfusion.  2. Severe, stage TI aortic stenosis: Plans for TAVR Tuesday. All questions answered at bedside today.   3. Demand ischemia: Likely related to severe aortic stenosis and moderate coronary artery disease in the setting of blood loss anemia. Symptoms improved.   4. Acute diastolic heart failure: Clinically improved after packed red blood cell transfusion and IV diuretics.   5. Persistent atrial fibrillation: Heart rate controlled. Currently not a candidate for anticoagulation after recent upper GI bleed.  Dispo: TAVR tomorrow, scheduled second case. Plan transfemoral access, 26 mm S3 valve.  Deatra James, MD  02/23/2017, 8:59 AM

## 2017-02-23 NOTE — Progress Notes (Signed)
Patient continues to c/o severe right hip pain, inspite of Tylenol, Flexeril, K-pad, and hot packs. K.Schorr notified and ordered Norco 1-2 tabs. Administered as ordered and will continue to monitor.

## 2017-02-24 ENCOUNTER — Inpatient Hospital Stay (HOSPITAL_COMMUNITY): Payer: Medicare Other | Admitting: Certified Registered"

## 2017-02-24 ENCOUNTER — Inpatient Hospital Stay (HOSPITAL_COMMUNITY): Payer: Medicare Other

## 2017-02-24 ENCOUNTER — Inpatient Hospital Stay (HOSPITAL_COMMUNITY): Payer: Medicare Other | Admitting: Emergency Medicine

## 2017-02-24 ENCOUNTER — Inpatient Hospital Stay (HOSPITAL_COMMUNITY): Admission: RE | Admit: 2017-02-24 | Payer: Medicare Other | Source: Ambulatory Visit | Admitting: Cardiovascular Disease

## 2017-02-24 ENCOUNTER — Encounter (HOSPITAL_COMMUNITY): Payer: Self-pay | Admitting: Certified Registered"

## 2017-02-24 ENCOUNTER — Encounter (HOSPITAL_COMMUNITY)
Admission: EM | Disposition: A | Discharge status: Home / Self Care | Payer: Self-pay | Source: Home / Self Care | Attending: Internal Medicine

## 2017-02-24 ENCOUNTER — Other Ambulatory Visit: Payer: Self-pay

## 2017-02-24 DIAGNOSIS — Z006 Encounter for examination for normal comparison and control in clinical research program: Secondary | ICD-10-CM

## 2017-02-24 DIAGNOSIS — I350 Nonrheumatic aortic (valve) stenosis: Secondary | ICD-10-CM

## 2017-02-24 HISTORY — PX: TRANSCATHETER AORTIC VALVE REPLACEMENT, TRANSFEMORAL: SHX6400

## 2017-02-24 HISTORY — PX: TEE WITHOUT CARDIOVERSION: SHX5443

## 2017-02-24 LAB — CBC
HCT: 30.6 % — ABNORMAL LOW (ref 39.0–52.0)
HEMATOCRIT: 27.2 % — AB (ref 39.0–52.0)
HEMOGLOBIN: 9.1 g/dL — AB (ref 13.0–17.0)
HEMOGLOBIN: 8.2 g/dL — AB (ref 13.0–17.0)
MCH: 25.1 pg — ABNORMAL LOW (ref 26.0–34.0)
MCH: 25.3 pg — ABNORMAL LOW (ref 26.0–34.0)
MCHC: 29.7 g/dL — ABNORMAL LOW (ref 30.0–36.0)
MCHC: 30.1 g/dL (ref 30.0–36.0)
MCV: 84.5 fL (ref 78.0–100.0)
MCV: 84 fL (ref 78.0–100.0)
PLATELETS: 176 10*3/uL (ref 150–400)
Platelets: 151 10*3/uL (ref 150–400)
RBC: 3.62 MIL/uL — AB (ref 4.22–5.81)
RBC: 3.24 MIL/uL — ABNORMAL LOW (ref 4.22–5.81)
RDW: 20.2 % — ABNORMAL HIGH (ref 11.5–15.5)
RDW: 20.3 % — ABNORMAL HIGH (ref 11.5–15.5)
WBC: 6.3 10*3/uL (ref 4.0–10.5)
WBC: 5.8 10*3/uL (ref 4.0–10.5)

## 2017-02-24 LAB — BASIC METABOLIC PANEL
ANION GAP: 8 (ref 5–15)
BUN: 23 mg/dL — ABNORMAL HIGH (ref 6–20)
CALCIUM: 8.6 mg/dL — AB (ref 8.9–10.3)
CHLORIDE: 108 mmol/L (ref 101–111)
CO2: 23 mmol/L (ref 22–32)
CREATININE: 1.21 mg/dL (ref 0.61–1.24)
GFR calc Af Amer: >60 mL/min (ref >60)
GFR calc non Af Amer: 56 mL/min — ABNORMAL LOW (ref >60)
Glucose, Bld: 97 mg/dL (ref 65–99)
Potassium: 4.1 mmol/L (ref 3.5–5.1)
SODIUM: 139 mmol/L (ref 135–145)

## 2017-02-24 LAB — POCT I-STAT 3, ART BLOOD GAS (G3+)
Acid-base deficit: 3 mmol/L — ABNORMAL HIGH (ref 0.0–2.0)
Bicarbonate: 22.4 mmol/L (ref 20.0–28.0)
O2 Saturation: 93 %
PCO2 ART: 38.8 mmHg (ref 32.0–48.0)
PH ART: 7.368 (ref 7.350–7.450)
TCO2: 24 mmol/L (ref 22–32)
pO2, Arterial: 68 mmHg — ABNORMAL LOW (ref 83.0–108.0)

## 2017-02-24 LAB — POCT I-STAT, CHEM 8
BUN: 23 mg/dL — ABNORMAL HIGH (ref 6–20)
BUN: 22 mg/dL — ABNORMAL HIGH (ref 6–20)
BUN: 22 mg/dL — AB (ref 6–20)
CHLORIDE: 105 mmol/L (ref 101–111)
CHLORIDE: 104 mmol/L (ref 101–111)
CREATININE: 1.1 mg/dL (ref 0.61–1.24)
Calcium, Ion: 1.25 mmol/L (ref 1.15–1.40)
Calcium, Ion: 1.2 mmol/L (ref 1.15–1.40)
Calcium, Ion: 1.2 mmol/L (ref 1.15–1.40)
Chloride: 103 mmol/L (ref 101–111)
Creatinine, Ser: 1 mg/dL (ref 0.61–1.24)
Creatinine, Ser: 1 mg/dL (ref 0.61–1.24)
Glucose, Bld: 102 mg/dL — ABNORMAL HIGH (ref 65–99)
Glucose, Bld: 112 mg/dL — ABNORMAL HIGH (ref 65–99)
Glucose, Bld: 111 mg/dL — ABNORMAL HIGH (ref 65–99)
HCT: 25 % — ABNORMAL LOW (ref 39.0–52.0)
HEMATOCRIT: 28 % — AB (ref 39.0–52.0)
HEMATOCRIT: 26 % — AB (ref 39.0–52.0)
HEMOGLOBIN: 9.5 g/dL — AB (ref 13.0–17.0)
HEMOGLOBIN: 8.8 g/dL — AB (ref 13.0–17.0)
Hemoglobin: 8.5 g/dL — ABNORMAL LOW (ref 13.0–17.0)
POTASSIUM: 4.1 mmol/L (ref 3.5–5.1)
POTASSIUM: 4.3 mmol/L (ref 3.5–5.1)
Potassium: 4.1 mmol/L (ref 3.5–5.1)
SODIUM: 141 mmol/L (ref 135–145)
SODIUM: 140 mmol/L (ref 135–145)
SODIUM: 140 mmol/L (ref 135–145)
TCO2: 23 mmol/L (ref 22–32)
TCO2: 22 mmol/L (ref 22–32)
TCO2: 23 mmol/L (ref 22–32)

## 2017-02-24 LAB — POCT I-STAT 4, (NA,K, GLUC, HGB,HCT)
Glucose, Bld: 106 mg/dL — ABNORMAL HIGH (ref 65–99)
HCT: 25 % — ABNORMAL LOW (ref 39.0–52.0)
HEMOGLOBIN: 8.5 g/dL — AB (ref 13.0–17.0)
Potassium: 3.9 mmol/L (ref 3.5–5.1)
Sodium: 142 mmol/L (ref 135–145)

## 2017-02-24 LAB — ANTINUCLEAR ANTIBODIES, IFA: ANTINUCLEAR ANTIBODIES, IFA: NEGATIVE

## 2017-02-24 SURGERY — IMPLANTATION, AORTIC VALVE, TRANSCATHETER, FEMORAL APPROACH
Anesthesia: Monitor Anesthesia Care | Site: Chest

## 2017-02-24 MED ORDER — LACTATED RINGERS IV SOLN: 500.0000 mL | Freq: Once | INTRAVENOUS | Status: DC | PRN

## 2017-02-24 MED ORDER — MIDAZOLAM HCL 2 MG/2ML IJ SOLN: 2.0000 mg | INTRAMUSCULAR | Status: DC | PRN

## 2017-02-24 MED ORDER — CHLORHEXIDINE GLUCONATE 0.12 % MT SOLN: 15.0000 mL | OROMUCOSAL | Status: AC

## 2017-02-24 MED ORDER — SODIUM CHLORIDE 0.9 % IV SOLN: 250.0000 mL | INTRAVENOUS | Status: DC | PRN

## 2017-02-24 MED ORDER — LIDOCAINE HCL (PF) 1 % IJ SOLN
INTRAMUSCULAR | Status: AC
  Filled 2017-02-24: qty 30

## 2017-02-24 MED ORDER — LEVOTHYROXINE SODIUM 75 MCG PO TABS: 75.0000 ug | ORAL_TABLET | Freq: Every day | ORAL | Status: DC

## 2017-02-24 MED ORDER — NITROGLYCERIN IN D5W 200-5 MCG/ML-% IV SOLN: 0.0000 ug/min | INTRAVENOUS | Status: DC

## 2017-02-24 MED ORDER — LIDOCAINE HCL 1 % IJ SOLN
INTRAMUSCULAR | Status: DC | PRN
  Administered 2017-02-24: 5 mL

## 2017-02-24 MED ORDER — PROPOFOL 10 MG/ML IV BOLUS
INTRAVENOUS | Status: DC | PRN
  Administered 2017-02-24: 20 mg via INTRAVENOUS

## 2017-02-24 MED ORDER — VITAMIN D 1000 UNITS PO TABS
1000.0000 [IU] | ORAL_TABLET | Freq: Every day | ORAL | Status: DC
  Administered 2017-02-25 – 2017-02-26 (×2): 1000 [IU] via ORAL
  Filled 2017-02-24 (×2): qty 1

## 2017-02-24 MED ORDER — LACTATED RINGERS IV SOLN
INTRAVENOUS | Status: DC | PRN
  Administered 2017-02-24: 10:00:00 via INTRAVENOUS

## 2017-02-24 MED ORDER — CLOPIDOGREL BISULFATE 75 MG PO TABS
75.0000 mg | ORAL_TABLET | Freq: Every day | ORAL | Status: DC
  Administered 2017-02-25 – 2017-02-26 (×2): 75 mg via ORAL
  Filled 2017-02-24 (×2): qty 1

## 2017-02-24 MED ORDER — ONDANSETRON HCL 4 MG/2ML IJ SOLN
INTRAMUSCULAR | Status: DC | PRN
  Administered 2017-02-24: 4 mg via INTRAVENOUS

## 2017-02-24 MED ORDER — FAMOTIDINE IN NACL 20-0.9 MG/50ML-% IV SOLN
20.0000 mg | Freq: Two times a day (BID) | INTRAVENOUS | Status: AC
  Administered 2017-02-24 (×2): 20 mg via INTRAVENOUS
  Filled 2017-02-24 (×2): qty 50

## 2017-02-24 MED ORDER — HEPARIN SODIUM (PORCINE) 1000 UNIT/ML IJ SOLN
INTRAMUSCULAR | Status: DC | PRN
  Administered 2017-02-24: 11000 [IU] via INTRAVENOUS

## 2017-02-24 MED ORDER — DEXTROSE 5 % IV SOLN
INTRAVENOUS | Status: DC | PRN
  Administered 2017-02-24: 40 ug/min via INTRAVENOUS

## 2017-02-24 MED ORDER — METOPROLOL TARTRATE 5 MG/5ML IV SOLN: 2.5000 mg | INTRAVENOUS | Status: DC | PRN

## 2017-02-24 MED ORDER — ATORVASTATIN CALCIUM 40 MG PO TABS
40.0000 mg | ORAL_TABLET | Freq: Every day | ORAL | Status: DC
  Administered 2017-02-25 – 2017-02-26 (×2): 40 mg via ORAL
  Filled 2017-02-24 (×2): qty 1

## 2017-02-24 MED ORDER — PROPOFOL 500 MG/50ML IV EMUL
INTRAVENOUS | Status: DC | PRN
  Administered 2017-02-24: 25 ug/kg/min via INTRAVENOUS

## 2017-02-24 MED ORDER — TRAMADOL HCL 50 MG PO TABS
50.0000 mg | ORAL_TABLET | ORAL | Status: DC | PRN
  Administered 2017-02-25 – 2017-02-26 (×2): 100 mg via ORAL
  Filled 2017-02-24 (×2): qty 2

## 2017-02-24 MED ORDER — SODIUM CHLORIDE 0.9% FLUSH
3.0000 mL | Freq: Two times a day (BID) | INTRAVENOUS | Status: DC
  Administered 2017-02-24 – 2017-02-25 (×2): 3 mL via INTRAVENOUS

## 2017-02-24 MED ORDER — MIDAZOLAM HCL 2 MG/2ML IJ SOLN
INTRAMUSCULAR | Status: AC
  Administered 2017-02-24: 1 mg via INTRAVENOUS
  Filled 2017-02-24: qty 2

## 2017-02-24 MED ORDER — SODIUM CHLORIDE 0.9 % IV SOLN
INTRAVENOUS | Status: DC | PRN
  Administered 2017-02-24: 1500 mL

## 2017-02-24 MED ORDER — MORPHINE SULFATE (PF) 4 MG/ML IV SOLN
2.0000 mg | INTRAVENOUS | Status: DC | PRN
  Administered 2017-02-25: 4 mg via INTRAVENOUS
  Filled 2017-02-24: qty 1

## 2017-02-24 MED ORDER — DEXTROSE 5 % IV SOLN
1.5000 g | Freq: Two times a day (BID) | INTRAVENOUS | Status: DC
  Administered 2017-02-24 – 2017-02-26 (×3): 1.5 g via INTRAVENOUS
  Filled 2017-02-24 (×5): qty 1.5

## 2017-02-24 MED ORDER — IODIXANOL 320 MG/ML IV SOLN
INTRAVENOUS | Status: DC | PRN
  Administered 2017-02-24: 101.3 mL via INTRAVENOUS

## 2017-02-24 MED ORDER — OXYCODONE HCL 5 MG PO TABS
5.0000 mg | ORAL_TABLET | ORAL | Status: DC | PRN
  Administered 2017-02-25 (×2): 10 mg via ORAL
  Filled 2017-02-24 (×3): qty 2

## 2017-02-24 MED ORDER — SODIUM CHLORIDE 0.9 % IV SOLN
1.0000 mL/kg/h | INTRAVENOUS | Status: AC
  Administered 2017-02-24: 1 mL/kg/h via INTRAVENOUS

## 2017-02-24 MED ORDER — ALBUMIN HUMAN 5 % IV SOLN: 250.0000 mL | INTRAVENOUS | Status: DC | PRN

## 2017-02-24 MED ORDER — DEXMEDETOMIDINE HCL IN NACL 400 MCG/100ML IV SOLN
INTRAVENOUS | Status: DC | PRN
  Administered 2017-02-24: 1 ug/kg/h via INTRAVENOUS

## 2017-02-24 MED ORDER — PHENYLEPHRINE HCL 10 MG/ML IJ SOLN
0.0000 ug/min | INTRAMUSCULAR | Status: DC
  Filled 2017-02-24: qty 2

## 2017-02-24 MED ORDER — LIDOCAINE 2% (20 MG/ML) 5 ML SYRINGE
INTRAMUSCULAR | Status: DC | PRN
  Administered 2017-02-24: 50 mg via INTRAVENOUS

## 2017-02-24 MED ORDER — SODIUM CHLORIDE 0.9% FLUSH: 3.0000 mL | INTRAVENOUS | Status: DC | PRN

## 2017-02-24 MED ORDER — MIDAZOLAM HCL 2 MG/2ML IJ SOLN
1.0000 mg | Freq: Once | INTRAMUSCULAR | Status: AC
  Administered 2017-02-24: 1 mg via INTRAVENOUS

## 2017-02-24 MED ORDER — ALFUZOSIN HCL ER 10 MG PO TB24
10.0000 mg | ORAL_TABLET | Freq: Every day | ORAL | Status: DC
  Administered 2017-02-24 – 2017-02-25 (×2): 10 mg via ORAL
  Filled 2017-02-24 (×2): qty 1

## 2017-02-24 MED ORDER — PROTAMINE SULFATE 10 MG/ML IV SOLN
INTRAVENOUS | Status: DC | PRN
  Administered 2017-02-24: 110 mg via INTRAVENOUS

## 2017-02-24 MED ORDER — FENTANYL CITRATE (PF) 100 MCG/2ML IJ SOLN
INTRAMUSCULAR | Status: DC | PRN
  Administered 2017-02-24: 50 ug via INTRAVENOUS

## 2017-02-24 MED ORDER — VANCOMYCIN HCL 1000 MG IV SOLR
INTRAVENOUS | Status: DC | PRN
  Administered 2017-02-24: 1250 mg via INTRAVENOUS

## 2017-02-24 MED ORDER — FENTANYL CITRATE (PF) 100 MCG/2ML IJ SOLN
50.0000 ug | Freq: Once | INTRAMUSCULAR | Status: AC
  Administered 2017-02-24: 50 ug via INTRAVENOUS

## 2017-02-24 MED ORDER — DEXTROSE 5 % IV SOLN
INTRAVENOUS | Status: DC | PRN
  Administered 2017-02-24: 1.5 g via INTRAVENOUS

## 2017-02-24 MED ORDER — FENTANYL CITRATE (PF) 100 MCG/2ML IJ SOLN
INTRAMUSCULAR | Status: AC
  Administered 2017-02-24: 50 ug via INTRAVENOUS
  Filled 2017-02-24: qty 2

## 2017-02-24 MED ORDER — DEXMEDETOMIDINE HCL 200 MCG/2ML IV SOLN
INTRAVENOUS | Status: DC | PRN
  Administered 2017-02-24: 75.9 ug via INTRAVENOUS

## 2017-02-24 MED ORDER — VANCOMYCIN HCL IN DEXTROSE 1-5 GM/200ML-% IV SOLN
1000.0000 mg | Freq: Once | INTRAVENOUS | Status: AC
  Administered 2017-02-24: 1000 mg via INTRAVENOUS
  Filled 2017-02-24: qty 200

## 2017-02-24 MED ORDER — 0.9 % SODIUM CHLORIDE (POUR BTL) OPTIME
TOPICAL | Status: DC | PRN
  Administered 2017-02-24: 5000 mL

## 2017-02-24 MED FILL — Potassium Chloride Inj 2 mEq/ML: INTRAVENOUS | Qty: 40 | Status: AC

## 2017-02-24 MED FILL — Magnesium Sulfate Inj 50%: INTRAMUSCULAR | Qty: 10 | Status: AC

## 2017-02-24 MED FILL — Heparin Sodium (Porcine) Inj 1000 Unit/ML: INTRAMUSCULAR | Qty: 30 | Status: AC

## 2017-02-24 SURGICAL SUPPLY — 120 items
ADAPTER UNIV SWAN GANZ BIP (ADAPTER) ×2 IMPLANT
ADAPTER UNV SWAN GANZ BIP (ADAPTER) ×2
ADH SKN CLS APL DERMABOND .7 (GAUZE/BANDAGES/DRESSINGS) ×2
ADPR CATH UNV NS SG CATH (ADAPTER) ×2
ATTRACTOMAT 16X20 MAGNETIC DRP (DRAPES) IMPLANT
BAG BANDED W/RUBBER/TAPE 36X54 (MISCELLANEOUS) ×4 IMPLANT
BAG DECANTER FOR FLEXI CONT (MISCELLANEOUS) IMPLANT
BAG EQP BAND 135X91 W/RBR TAPE (MISCELLANEOUS) ×2
BAG SNAP BAND KOVER 36X36 (MISCELLANEOUS) ×8 IMPLANT
BLADE 10 SAFETY STRL DISP (BLADE) ×2 IMPLANT
BLADE CLIPPER SURG (BLADE) IMPLANT
BLADE STERNUM SYSTEM 6 (BLADE) ×4 IMPLANT
CABLE ADAPT CONN TEMP 6FT (ADAPTER) ×4 IMPLANT
CABLE PACING FASLOC BIEGE (MISCELLANEOUS) ×2 IMPLANT
CABLE PACING FASLOC BLUE (MISCELLANEOUS) ×4 IMPLANT
CANISTER SUCT 3000ML PPV (MISCELLANEOUS) IMPLANT
CANNULA FEM VENOUS REMOTE 22FR (CANNULA) IMPLANT
CANNULA OPTISITE PERFUSION 16F (CANNULA) IMPLANT
CANNULA OPTISITE PERFUSION 18F (CANNULA) IMPLANT
CATH DIAG EXPO 6F VENT PIG 145 (CATHETERS) ×8 IMPLANT
CATH EXPO 5FR AL1 (CATHETERS) ×4 IMPLANT
CATH S G BIP PACING (SET/KITS/TRAYS/PACK) ×6 IMPLANT
CLIP VESOCCLUDE MED 24/CT (CLIP) ×2 IMPLANT
CLIP VESOCCLUDE SM WIDE 24/CT (CLIP) ×2 IMPLANT
CONT SPEC 4OZ CLIKSEAL STRL BL (MISCELLANEOUS) ×8 IMPLANT
COVER BACK TABLE 60X90IN (DRAPES) ×4 IMPLANT
COVER BACK TABLE 80X110 HD (DRAPES) ×4 IMPLANT
COVER DOME SNAP 22 D (MISCELLANEOUS) ×4 IMPLANT
COVER MAYO STAND STRL (DRAPES) ×2 IMPLANT
COVER PROBE W GEL 5X96 (DRAPES) ×2 IMPLANT
CRADLE DONUT ADULT HEAD (MISCELLANEOUS) ×4 IMPLANT
DERMABOND ADVANCED (GAUZE/BANDAGES/DRESSINGS) ×2
DERMABOND ADVANCED .7 DNX12 (GAUZE/BANDAGES/DRESSINGS) ×2 IMPLANT
DEVICE CLOSURE PERCLS PRGLD 6F (VASCULAR PRODUCTS) ×4 IMPLANT
DRAPE INCISE IOBAN 66X45 STRL (DRAPES) IMPLANT
DRAPE SLUSH MACHINE 52X66 (DRAPES) ×4 IMPLANT
DRSG TEGADERM 4X4.75 (GAUZE/BANDAGES/DRESSINGS) ×4 IMPLANT
ELECT REM PT RETURN 9FT ADLT (ELECTROSURGICAL) ×8
ELECTRODE REM PT RTRN 9FT ADLT (ELECTROSURGICAL) ×4 IMPLANT
FELT TEFLON 6X6 (MISCELLANEOUS) ×2 IMPLANT
FEMORAL VENOUS CANN RAP (CANNULA) IMPLANT
GAUZE SPONGE 4X4 12PLY STRL (GAUZE/BANDAGES/DRESSINGS) ×4 IMPLANT
GAUZE SPONGE 4X4 12PLY STRL LF (GAUZE/BANDAGES/DRESSINGS) ×2 IMPLANT
GLOVE BIO SURGEON STRL SZ 6.5 (GLOVE) ×2 IMPLANT
GLOVE BIO SURGEON STRL SZ7.5 (GLOVE) ×2 IMPLANT
GLOVE BIO SURGEON STRL SZ8 (GLOVE) ×6 IMPLANT
GLOVE BIO SURGEON STRL SZ8.5 (GLOVE) ×2 IMPLANT
GLOVE BIO SURGEONS STRL SZ 6.5 (GLOVE) ×2
GLOVE BIOGEL PI IND STRL 6.5 (GLOVE) IMPLANT
GLOVE BIOGEL PI IND STRL 8.5 (GLOVE) IMPLANT
GLOVE BIOGEL PI INDICATOR 6.5 (GLOVE) ×6
GLOVE BIOGEL PI INDICATOR 8.5 (GLOVE) ×2
GLOVE EUDERMIC 7 POWDERFREE (GLOVE) ×4 IMPLANT
GLOVE ORTHO TXT STRL SZ7.5 (GLOVE) ×2 IMPLANT
GOWN STRL REUS W/ TWL LRG LVL3 (GOWN DISPOSABLE) ×6 IMPLANT
GOWN STRL REUS W/ TWL XL LVL3 (GOWN DISPOSABLE) ×12 IMPLANT
GOWN STRL REUS W/TWL 2XL LVL3 (GOWN DISPOSABLE) ×2 IMPLANT
GOWN STRL REUS W/TWL LRG LVL3 (GOWN DISPOSABLE) ×16
GOWN STRL REUS W/TWL XL LVL3 (GOWN DISPOSABLE) ×24
GUIDEWIRE SAF TJ AMPL .035X180 (WIRE) ×4 IMPLANT
GUIDEWIRE SAFE TJ AMPLATZ EXST (WIRE) ×4 IMPLANT
GUIDEWIRE STRAIGHT .035 260CM (WIRE) ×4 IMPLANT
INSERT FOGARTY 61MM (MISCELLANEOUS) ×2 IMPLANT
INSERT FOGARTY SM (MISCELLANEOUS) IMPLANT
INSERT FOGARTY XLG (MISCELLANEOUS) IMPLANT
KIT BASIN OR (CUSTOM PROCEDURE TRAY) ×4 IMPLANT
KIT DILATOR VASC 18G NDL (KITS) IMPLANT
KIT HEART LEFT (KITS) ×4 IMPLANT
KIT ROOM TURNOVER OR (KITS) ×4 IMPLANT
KIT SUCTION CATH 14FR (SUCTIONS) ×4 IMPLANT
NDL 25GX 5/8IN NON SAFETY (NEEDLE) IMPLANT
NDL HYPO 25GX1X1/2 BEV (NEEDLE) IMPLANT
NDL PERC 18GX7CM (NEEDLE) ×2 IMPLANT
NEEDLE 25GX 5/8IN NON SAFETY (NEEDLE) ×4 IMPLANT
NEEDLE HYPO 25GX1X1/2 BEV (NEEDLE) ×4 IMPLANT
NEEDLE PERC 18GX7CM (NEEDLE) ×4 IMPLANT
NS IRRIG 1000ML POUR BTL (IV SOLUTION) ×16 IMPLANT
PACK AORTA (CUSTOM PROCEDURE TRAY) ×4 IMPLANT
PAD ARMBOARD 7.5X6 YLW CONV (MISCELLANEOUS) ×8 IMPLANT
PAD ELECT DEFIB RADIOL ZOLL (MISCELLANEOUS) ×4 IMPLANT
PATCH TACHOSII LRG 9.5X4.8 (VASCULAR PRODUCTS) IMPLANT
PERCLOSE PROGLIDE 6F (VASCULAR PRODUCTS) ×20
SET MICROPUNCTURE 5F STIFF (MISCELLANEOUS) ×4 IMPLANT
SHEATH AVANTI 11CM 8FR (MISCELLANEOUS) ×4 IMPLANT
SHEATH FLEX ANSEL ST 6FR 45CM (SHEATH) ×2 IMPLANT
SHEATH PINNACLE 6F 10CM (SHEATH) ×8 IMPLANT
SLEEVE REPOSITIONING LENGTH 30 (MISCELLANEOUS) ×4 IMPLANT
SPONGE LAP 4X18 X RAY DECT (DISPOSABLE) ×2 IMPLANT
STOPCOCK MORSE 400PSI 3WAY (MISCELLANEOUS) ×18 IMPLANT
SUT ETHIBOND X763 2 0 SH 1 (SUTURE) IMPLANT
SUT GORETEX CV 4 TH 22 36 (SUTURE) IMPLANT
SUT GORETEX CV4 TH-18 (SUTURE) IMPLANT
SUT GORETEX TH-18 36 INCH (SUTURE) IMPLANT
SUT MNCRL AB 3-0 PS2 18 (SUTURE) IMPLANT
SUT PROLENE 3 0 SH1 36 (SUTURE) IMPLANT
SUT PROLENE 4 0 RB 1 (SUTURE)
SUT PROLENE 4-0 RB1 .5 CRCL 36 (SUTURE) IMPLANT
SUT PROLENE 5 0 C 1 36 (SUTURE) IMPLANT
SUT PROLENE 6 0 C 1 30 (SUTURE) IMPLANT
SUT SILK  1 MH (SUTURE) ×2
SUT SILK 1 MH (SUTURE) ×2 IMPLANT
SUT SILK 2 0 SH CR/8 (SUTURE) IMPLANT
SUT VIC AB 2-0 CT1 27 (SUTURE)
SUT VIC AB 2-0 CT1 TAPERPNT 27 (SUTURE) IMPLANT
SUT VIC AB 2-0 CTX 36 (SUTURE) IMPLANT
SUT VIC AB 3-0 SH 8-18 (SUTURE) IMPLANT
SYR 10ML LL (SYRINGE) ×12 IMPLANT
SYR 30ML LL (SYRINGE) ×8 IMPLANT
SYR 50ML LL SCALE MARK (SYRINGE) ×4 IMPLANT
TAPE CLOTH SURG 4X10 WHT LF (GAUZE/BANDAGES/DRESSINGS) ×2 IMPLANT
TOWEL OR 17X26 10 PK STRL BLUE (TOWEL DISPOSABLE) ×8 IMPLANT
TRANSDUCER W/STOPCOCK (MISCELLANEOUS) ×8 IMPLANT
TRAY FOLEY SILVER 14FR TEMP (SET/KITS/TRAYS/PACK) ×2 IMPLANT
TRAY FOLEY SILVER 16FR TEMP (SET/KITS/TRAYS/PACK) ×2 IMPLANT
TUBE SUCT INTRACARD DLP 20F (MISCELLANEOUS) IMPLANT
TUBING HIGH PRESSURE 120CM (CONNECTOR) ×4 IMPLANT
VALVE HEART TRANSCATH SZ3 26MM (Prosthesis & Implant Heart) ×2 IMPLANT
WIRE .035 3MM-J 145CM (WIRE) ×2 IMPLANT
WIRE AMPLATZ SS-J .035X180CM (WIRE) ×4 IMPLANT
WIRE BENTSON .035X145CM (WIRE) ×4 IMPLANT

## 2017-02-24 NOTE — Anesthesia Postprocedure Evaluation (Signed)
Anesthesia Post Note  Patient: [name] [name]  Procedure(s) Performed: Procedure(s) (LRB): TRANSCATHETER AORTIC VALVE REPLACEMENT, TRANSFEMORAL using a 26m Edwards Sapien 3 Aortic Valve (N/A) TRANSESOPHAGEAL ECHOCARDIOGRAM (TEE) (N/A)     Patient location during evaluation: ICU Anesthesia Type: MAC Level of consciousness: responds to stimulation Pain management: pain level controlled Vital Signs Assessment: post-procedure vital signs reviewed and stable Respiratory status: patient connected to nasal cannula oxygen, spontaneous breathing, nonlabored ventilation and respiratory function stable Cardiovascular status: stable Anesthetic complications: no    Last Vitals:  Vitals:   02/24/17 1530 02/24/17 1637  BP: (!) 110/54   Pulse: 65   Resp: 11   Temp:  36.4 C  SpO2: 97%     Last Pain:  Vitals:   02/24/17 1637  TempSrc: Oral  PainSc:                  Ryan P Ellender

## 2017-02-24 NOTE — Anesthesia Preprocedure Evaluation (Addendum)
Anesthesia Evaluation  Patient identified by MRN, date of birth, ID band Patient awake    Reviewed: Allergy & Precautions, NPO status , Patient's Chart, lab work & pertinent test results  Airway Mallampati: I  TM Distance: >3 FB Neck ROM: Full    Dental  (+) Edentulous Lower, Edentulous Upper   Pulmonary COPD, former smoker,    Pulmonary exam normal breath sounds clear to auscultation       Cardiovascular hypertension, Pt. on medications + CAD and + Peripheral Vascular Disease  (-) Past MI + dysrhythmias Atrial Fibrillation + Valvular Problems/Murmurs AS  Rhythm:Irregular Rate:Normal + Systolic murmurs ECG: a-fib, rate 80  ECHO: Left ventricle: The cavity size was normal. Wall thickness was increased in a pattern of mild LVH. Systolic function was normal. The estimated ejection fraction was in the range of 60% to 65%. Aortic valve: AV is thickened, calcified with restricted motion Peak and mean gradients through the valve are 89 and 55 mm Hg respectively consistent with severe AS. No significant change from echo of Jan 2018 There was mild regurgitation. Mitral valve: Calcified annulus. Mildly thickened leaflets . There was mild regurgitation. Left atrium: The atrium was severely dilated. Right atrium: The atrium was mildly dilated. Pulmonary arteries: PA peak pressure: 33 mm Hg (S).  CATH 1. Moderate proximal LAD stenosis 2. Patent left main, left circumflex, and RCA with mild nonobstructive disease 3. Severely calcified and restricted aortic valve with known severe aortic stenosis by noninvasive assessment 4. Mild pulmonary hypertension likely related to LV diastolic dysfunction in the setting of severe aortic stenosis     Neuro/Psych negative neurological ROS  negative psych ROS   GI/Hepatic negative GI ROS, Neg liver ROS,   Endo/Other  Hypothyroidism   Renal/GU Renal disease (ARI 2/2 stone obstruction)   Bladder  tumor    Musculoskeletal  (+) Arthritis , Osteoarthritis,    Abdominal   Peds  Hematology  (+) anemia ,   Anesthesia Other Findings Hyperlipidemia  Reproductive/Obstetrics                             Anesthesia Physical  Anesthesia Plan  ASA: IV  Anesthesia Plan: MAC   Post-op Pain Management:    Induction: Intravenous  PONV Risk Score and Plan: 1 and Treatment may vary due to age or medical condition  Airway Management Planned: Natural Airway  Additional Equipment: Arterial line, CVP and Ultrasound Guidance Line Placement  Intra-op Plan:   Post-operative Plan:   Informed Consent: I have reviewed the patients History and Physical, chart, labs and discussed the procedure including the risks, benefits and alternatives for the proposed anesthesia with the patient or authorized representative who has indicated his/her understanding and acceptance.     Plan Discussed with: CRNA  Anesthesia Plan Comments:       Anesthesia Quick Evaluation

## 2017-02-24 NOTE — Anesthesia Procedure Notes (Signed)
Procedure Name: MAC Date/Time: 02/24/2017 11:14 AM Performed by: Melina Copa, Undra Trembath R Pre-anesthesia Checklist: Patient identified, Emergency Drugs available, Suction available, Patient being monitored and Timeout performed Patient Re-evaluated:Patient Re-evaluated prior to induction Oxygen Delivery Method: Nasal cannula Ventilation: Nasal airway inserted- appropriate to patient size Placement Confirmation: positive ETCO2 Dental Injury: Teeth and Oropharynx as per pre-operative assessment

## 2017-02-24 NOTE — Anesthesia Procedure Notes (Signed)
Central Venous Catheter Insertion Performed by: Murvin Natal, anesthesiologist Start/End9/09/2016 10:30 AM, 02/24/2017 10:40 AM Patient location: Pre-op. Preanesthetic checklist: patient identified, IV checked, site marked, risks and benefits discussed, surgical consent, monitors and equipment checked, pre-op evaluation, timeout performed and anesthesia consent Lidocaine 1% used for infiltration and patient sedated Hand hygiene performed  and maximum sterile barriers used  Catheter size: 8 Fr Total catheter length 16. Central line was placed.Double lumen Procedure performed using ultrasound guided technique. Ultrasound Notes:image(s) printed for medical record Attempts: 1 Following insertion, dressing applied and line sutured. Post procedure assessment: blood return through all ports  Patient tolerated the procedure well with no immediate complications.

## 2017-02-24 NOTE — Interval H&P Note (Signed)
History and Physical Interval Note:  02/24/2017 9:34 AM  [name]  has presented today for surgery, with the diagnosis of SEVERE AS  The various methods of treatment have been discussed with the patient and family. After consideration of risks, benefits and other options for treatment, the patient has consented to  Procedure(s): TRANSCATHETER AORTIC VALVE REPLACEMENT, TRANSFEMORAL (N/A) TRANSESOPHAGEAL ECHOCARDIOGRAM (TEE) (N/A) as a surgical intervention .  The patient's history has been reviewed, patient examined, no change in status, stable for surgery.  I have reviewed the patient's chart and labs.  Questions were answered to the patient's satisfaction.     Sherren Mocha

## 2017-02-24 NOTE — Progress Notes (Signed)
TAVR Team Note: Patient seen in the pre-operative area. His wife is at the bedside. He had a good night except for some hip pain which he has been dealing with recently. No other complaints. Denies shortness of breath this morning.  Patient's laboratory data is reviewed. Discussed with the multidisciplinary heart valve team this morning with plans for TAVR via a percutaneous transfemoral approach today. All of their questions are answered.  Sherren Mocha 02/24/2017 9:33 AM

## 2017-02-24 NOTE — H&P (View-Only) (Signed)
Progress Note  Patient Name: [name] [name] Date of Encounter: 02/23/2017  Primary Cardiologist: Dr Bronson Ing  Subjective   No chest pain or shortness of breath. Wife at bedside. Feels much better than when he was admitted to the hospital.   Inpatient Medications    Scheduled Meds: . levothyroxine  37.5 mcg Intravenous Daily  . pantoprazole  40 mg Oral BID  . sodium chloride flush  3 mL Intravenous Q12H   Continuous Infusions: . cefTRIAXone (ROCEPHIN)  IV Stopped (02/22/17 1640)   PRN Meds: acetaminophen **OR** acetaminophen, cyclobenzaprine, HYDROcodone-acetaminophen, MUSCLE RUB, ondansetron **OR** ondansetron (ZOFRAN) IV   Vital Signs    Vitals:   02/22/17 2006 02/22/17 2334 02/23/17 0358 02/23/17 0700  BP: 113/66 (!) 117/58 117/88   Pulse: (!) 104 89 (!) 113   Resp: 19 17 14    Temp: 98.2 F (36.8 C) 97.7 F (36.5 C) 98 F (36.7 C)   TempSrc: Oral Oral Oral   SpO2: 91% 91% 97%   Weight:    172 lb 13.5 oz (78.4 kg)  Height:        Intake/Output Summary (Last 24 hours) at 02/23/17 0859 Last data filed at 02/23/17 8250  Gross per 24 hour  Intake              650 ml  Output             1150 ml  Net             -500 ml   Filed Weights   02/21/17 0400 02/22/17 0316 02/23/17 0700  Weight: 170 lb 6.4 oz (77.3 kg) 172 lb 9.9 oz (78.3 kg) 172 lb 13.5 oz (78.4 kg)    Telemetry    Atrial fibrillation with frequent PVC's - Personally Reviewed  ECG    Atrial fibrillation, resolution of marked ST-T changes noted - Personally Reviewed  Physical Exam  Alert, oriented, elderly male, in NAD GEN: No acute distress.   Neck: No JVD Cardiac: irregular with 2/6 harsh systolic murmur at the RUSB.  Respiratory: Clear to auscultation bilaterally. GI: Soft, nontender, non-distended  MS: No edema; No deformity. Neuro:  Nonfocal  Psych: Normal affect   Labs    Chemistry Recent Labs Lab 02/20/17 1021 02/21/17 0357 02/22/17 0339 02/23/17 0415  NA 134* 137 137  140  K 4.2 3.8 3.3* 3.6  CL 105 105 106 109  CO2 17* 20* 20* 25  GLUCOSE 135* 98 98 100*  BUN 30* 28* 22* 18  CREATININE 1.51* 1.42* 1.30* 1.22  CALCIUM 8.9 8.6* 8.4* 8.3*  PROT 7.1  --   --   --   ALBUMIN 3.7  --   --   --   AST 62*  --   --   --   ALT 50  --   --   --   ALKPHOS 74  --   --   --   BILITOT 1.4*  --   --   --   GFRNONAA 43* 46* 51* 55*  GFRAA 50* 53* 59* >60  ANIONGAP 12 12 11 6      Hematology Recent Labs Lab 02/21/17 0357 02/22/17 0339 02/23/17 0415  WBC 8.0 7.5 7.2  RBC 3.35* 3.44* 3.43*  HGB 8.4* 8.6* 8.5*  HCT 27.1* 28.2* 28.6*  MCV 80.9 82.0 83.4  MCH 25.1* 25.0* 24.8*  MCHC 31.0 30.5 29.7*  RDW 18.5* 19.1* 19.6*  PLT 176 171 181    Cardiac Enzymes Recent Labs Lab 02/20/17 1223  02/21/17 0220 02/21/17 0357  TROPONINI 0.24* 0.37* 0.53*   No results for input(s): TROPIPOC in the last 168 hours.   BNPNo results for input(s): BNP, PROBNP in the last 168 hours.   DDimer No results for input(s): DDIMER in the last 168 hours.   Radiology    Dg Hip Unilat With Pelvis 2-3 Views Right  Result Date: 02/23/2017 CLINICAL DATA:  Right hip pain for 1 day. EXAM: DG HIP (WITH OR WITHOUT PELVIS) 2-3V RIGHT COMPARISON:  Abdominal CT 01/29/2017 FINDINGS: The cortical margins of the bony pelvis and right hip are intact. No fracture. Pubic symphysis and sacroiliac joints are congruent. Both femoral heads are well-seated in the respective acetabula. No radiographic evidence of focal lesion. Vascular calcification in bi-iliac graft in place. A dressing overlies the lateral hip. IMPRESSION: No acute abnormality or explanation for right hip pain. Electronically Signed   By: Jeb Levering M.D.   On: 02/23/2017 03:27     Patient Profile     76 y.o. male with severe symptomatic aortic stenosis admitted with symptomatic anemia related to UGI bleed on anticoagulation for atrial fibrillation  Assessment & Plan    1. Acute blood loss anemia: EGD results reviewed.  Gastric ulcers without active bleeding identified. Patient now off anticoagulation with stable hemoglobin after blood transfusion.  2. Severe, stage TI aortic stenosis: Plans for TAVR Tuesday. All questions answered at bedside today.   3. Demand ischemia: Likely related to severe aortic stenosis and moderate coronary artery disease in the setting of blood loss anemia. Symptoms improved.   4. Acute diastolic heart failure: Clinically improved after packed red blood cell transfusion and IV diuretics.   5. Persistent atrial fibrillation: Heart rate controlled. Currently not a candidate for anticoagulation after recent upper GI bleed.  Dispo: TAVR tomorrow, scheduled second case. Plan transfemoral access, 26 mm S3 valve.  Deatra James, MD  02/23/2017, 8:59 AM

## 2017-02-24 NOTE — Significant Event (Signed)
Was not able to see this patient is morning. Had TAVR procedure done earlier today, spoke with cardiology master, tending will be changed to Dr. Burt Knack.  Birdie Hopes Pager: [phone] 02/24/2017, 1:55 PM

## 2017-02-24 NOTE — Progress Notes (Signed)
Bedside report received from Frank, RN from 2N.

## 2017-02-24 NOTE — Transfer of Care (Signed)
Immediate Anesthesia Transfer of Care Note  Patient: [name] [name]  Procedure(s) Performed: Procedure(s): TRANSCATHETER AORTIC VALVE REPLACEMENT, TRANSFEMORAL using a 76m Edwards Sapien 3 Aortic Valve (N/A) TRANSESOPHAGEAL ECHOCARDIOGRAM (TEE) (N/A)  Patient Location: ICU  Anesthesia Type:MAC  Level of Consciousness: drowsy  Airway & Oxygen Therapy: Patient Spontanous Breathing and Patient connected to nasal cannula oxygen  Post-op Assessment: Report given to RN, Post -op Vital signs reviewed and stable and Patient moving all extremities  Post vital signs: Reviewed and stable  Last Vitals:  Vitals:   02/24/17 1240 02/24/17 1245  BP:    Pulse: 80 73  Resp:    Temp:    SpO2:      Last Pain:  Vitals:   02/24/17 0754  TempSrc: Axillary  PainSc: 2       Patients Stated Pain Goal: 0 [id number]  Complications: No apparent anesthesia complications

## 2017-02-24 NOTE — Op Note (Signed)
[facility]   MULTIDISCIPLINARY HEART VALVE TEAM   TAVR OPERATIVE NOTE   Date of Procedure:  02/24/2017  Preoperative Diagnosis: Severe Aortic Stenosis   Postoperative Diagnosis: Same   Procedure:    Transcatheter Aortic Valve Replacement - Percutaneous Transfemoral Approach  Edwards Sapien 3 THV (size 26 mm, model # 9600TFX, serial # [id number])   Co-Surgeons:  Gaye Pollack, MD and Sherren Mocha, MD  Anesthesiologist:  Adele Barthel, MD  Echocardiographer:  Ena Dawley, MD  Pre-operative Echo Findings:  Severe aortic stenosis  Normal left ventricular systolic function  Post-operative Echo Findings:  Trace paravalvular leak  Normal left ventricular systolic function  BRIEF CLINICAL NOTE AND INDICATIONS FOR SURGERY  76 year old gentleman with an extensive past medical history. Medical problems include long-standing tobacco use quit 11 years ago, COPD, abdominal aortic aneurysm status post stent graft repair in 2011, bladder cancer, and severe aortic stenosis. The patient was referred to Dr. Roxan Hockey because of a hypermetabolic spiculated nodule in the right upper lobe. Because he has severe aortic stenosis with a mean gradient of 55 mmHg and New York Heart Association functional class II symptoms of chronic diastolic heart failure and exertional angina, he was evaluated for TAVR. He was not felt to be a candidate for conventional surgery because of his multiple comorbid conditions. He underwent evaluation for TAVR including cardiac catheterization, CT angiography of the heart, CT angiography of the chest/abdomen/pelvis, and a second surgical opinion. The patient was noted to have atrial fibrillation and he was started on anticoagulation. When he presented for his preoperative visit he was found to have a hemoglobin of 5.9 and he underwent EGD demonstrating gastric ulcers that were nonbleeding. After receiving packed red blood cell transfusion his symptoms  resolved and he was kept in the hospital for TAVR today.  During the course of the patient's preoperative work up they have been evaluated comprehensively by a multidisciplinary team of specialists coordinated through the [facility] in the [facility].  They have been demonstrated to suffer from symptomatic severe aortic stenosis as noted above. The patient has been counseled extensively as to the relative risks and benefits of all options for the treatment of severe aortic stenosis including long term medical therapy, conventional surgery for aortic valve replacement, and transcatheter aortic valve replacement.  The patient has been independently evaluated by two cardiac surgeons including Dr. Roxan Hockey and Dr. Cyndia Bent, and they are felt to be at high risk for conventional surgical aortic valve replacement. Based upon review of all of the patient's preoperative diagnostic tests they are felt to be candidate for transcatheter aortic valve replacement using the transfemoral approach as an alternative to high risk conventional surgery.    Following the decision to proceed with transcatheter aortic valve replacement, a discussion has been held regarding what types of management strategies would be attempted intraoperatively in the event of life-threatening complications, including whether or not the patient would be considered a candidate for the use of cardiopulmonary bypass and/or conversion to open sternotomy for attempted surgical intervention.  The patient has been advised of a variety of complications that might develop peculiar to this approach including but not limited to risks of death, stroke, paravalvular leak, aortic dissection or other major vascular complications, aortic annulus rupture, device embolization, cardiac rupture or perforation, acute myocardial infarction, arrhythmia, heart block or bradycardia requiring permanent pacemaker placement,  congestive heart failure, respiratory failure, renal failure, pneumonia, infection, other late complications related to structural  valve deterioration or migration, or other complications that might ultimately cause a temporary or permanent loss of functional independence or other long term morbidity.  The patient provides full informed consent for the procedure as described and all questions were answered preoperatively.  DETAILS OF THE OPERATIVE PROCEDURE  PREPARATION:   The patient is brought to the operating room on the above mentioned date and central monitoring was established by the anesthesia team including placement of a central venous line and radial arterial line. The patient is placed in the supine position on the operating table.  Intravenous antibiotics are administered. The patient is monitored closely throughout the procedure under conscious sedation.   Baseline transthoracic echocardiogram was performed. The patient's chest, abdomen, both groins, and both lower extremities are prepared and draped in a sterile manner. A time out procedure is performed.  PERIPHERAL ACCESS:   Using the modified Seldinger technique with ultrasound guidance, femoral arterial and venous access was obtained with placement of 6 Fr sheaths on the left side.  A pigtail diagnostic catheter was passed through the left femoral arterial sheath under fluoroscopic guidance into the aortic root.  A temporary transvenous pacemaker catheter was passed through the left femoral venous sheath under fluoroscopic guidance into the right ventricle.  Despite a prolonged attempt the temporary pacemaker wire would not float into a stable position in the right ventricle. Along antral sheath was used and with that in place in the right atrium the temporary wire was advanced into the RV apex. The pacemaker was tested to ensure stable lead placement and pacemaker capture. Aortic root angiography was performed in order to determine the  optimal angiographic angle for valve deployment.  TRANSFEMORAL ACCESS:  A micropuncture technique is used to access the right femoral artery under fluoroscopic and ultrasound guidance.  The femoral artery is heavily calcified. Ultrasound is used to find the most ideal segment for access. 2 Perclose devices are deployed at 10' and 2' positions to 'PreClose' the femoral artery. An 8 French sheath is placed and then an Amplatz Superstiff wire is advanced through the sheath. This is changed out for a 14 French transfemoral E-Sheath after progressively dilating over the Superstiff wire.  An AL 2 catheter was used to direct a straight-tip exchange length wire across the native aortic valve into the left ventricle. This was exchanged out for a pigtail catheter and position was confirmed in the LV apex.  The pigtail catheter was exchanged for an Amplatz Extra-stiff wire in the LV apex.  Echocardiography was utilized to confirm appropriate wire position and no sign of entanglement in the mitral subvalvular apparatus.  TRANSCATHETER HEART VALVE DEPLOYMENT:  An Edwards Sapien 3 transcatheter heart valve (size 26 mm, model #9600TFX, serial # [id number]) was prepared and crimped per manufacturer's guidelines, and the proper orientation of the valve is confirmed on the [employer] delivery system. The valve was advanced through the introducer sheath using normal technique until in an appropriate position in the abdominal aorta beyond the sheath tip. The balloon was then retracted and using the fine-tuning wheel was centered on the valve. The valve was then advanced across the aortic arch using appropriate flexion of the catheter. The valve was carefully positioned across the aortic valve annulus. The Commander catheter was retracted using normal technique. Once final position of the valve has been confirmed by angiographic assessment, the valve is deployed while temporarily holding ventilation and during rapid  ventricular pacing to maintain systolic blood pressure < 50 mmHg and pulse pressure <  10 mmHg. The balloon inflation is held for >3 seconds after reaching full deployment volume. Once the balloon has fully deflated the balloon is retracted into the ascending aorta and valve function is assessed using echocardiography. There is felt to be trace paravalvular leak and no central aortic insufficiency.  The patient's hemodynamic recovery following valve deployment is good.  The deployment balloon and guidewire are both removed. Echo demostrated acceptable post-procedural gradients, stable mitral valve function, and trace aortic insufficiency.   PROCEDURE COMPLETION:  The sheath was removed and femoral artery closure is performed using the 2 previously deployed Perclose devices.  Once the sutures are tightened the site is dry with no evidence of hematoma or residual bleeding. Protamine was administered once femoral arterial repair was complete. The temporary pacemaker, pigtail catheters and femoral sheaths were removed with manual pressure used for hemostasis.   The patient tolerated the procedure well and is transported to the surgical intensive care in stable condition. There were no immediate intraoperative complications. All sponge instrument and needle counts are verified correct at completion of the operation.   The patient received a total of 40 mL of intravenous contrast during the procedure.   Sherren Mocha, MD 02/24/2017 1:25 PM

## 2017-02-24 NOTE — Anesthesia Procedure Notes (Signed)
Arterial Line Insertion Performed by: Lelon Perla A, CRNA  Preanesthetic checklist: patient identified, IV checked, risks and benefits discussed, surgical consent, monitors and equipment checked and pre-op evaluation Lidocaine 1% used for infiltration and patient sedated Right, radial was placed Catheter size: 20 G Hand hygiene performed  and maximum sterile barriers used   Attempts: 1 Procedure performed without using ultrasound guided technique. Following insertion, dressing applied and Biopatch.

## 2017-02-24 NOTE — Progress Notes (Signed)
TAVR Team Note: Pt without complaints. Hemodynamically stable. Rhythm atrial fib, rate controlled. BL groin sites clear. Plan: DC foley when able to ambulate, DC art line. Early post-op care.  [name] [name] 02/24/2017 4:01 PM

## 2017-02-25 ENCOUNTER — Inpatient Hospital Stay (HOSPITAL_COMMUNITY): Payer: Medicare Other

## 2017-02-25 ENCOUNTER — Encounter (HOSPITAL_COMMUNITY): Payer: Self-pay | Admitting: Cardiovascular Disease

## 2017-02-25 DIAGNOSIS — I481 Persistent atrial fibrillation: Secondary | ICD-10-CM

## 2017-02-25 DIAGNOSIS — I350 Nonrheumatic aortic (valve) stenosis: Secondary | ICD-10-CM

## 2017-02-25 DIAGNOSIS — Z954 Presence of other heart-valve replacement: Secondary | ICD-10-CM

## 2017-02-25 DIAGNOSIS — I340 Nonrheumatic mitral (valve) insufficiency: Secondary | ICD-10-CM

## 2017-02-25 LAB — CBC
HEMATOCRIT: 26.8 % — AB (ref 39.0–52.0)
Hemoglobin: 7.9 g/dL — ABNORMAL LOW (ref 13.0–17.0)
MCH: 25 pg — ABNORMAL LOW (ref 26.0–34.0)
MCHC: 29.5 g/dL — ABNORMAL LOW (ref 30.0–36.0)
MCV: 84.8 fL (ref 78.0–100.0)
PLATELETS: 151 10*3/uL (ref 150–400)
RBC: 3.16 MIL/uL — ABNORMAL LOW (ref 4.22–5.81)
RDW: 20 % — AB (ref 11.5–15.5)
WBC: 5.9 10*3/uL (ref 4.0–10.5)

## 2017-02-25 LAB — BASIC METABOLIC PANEL
Anion gap: 6 (ref 5–15)
BUN: 18 mg/dL (ref 6–20)
CO2: 23 mmol/L (ref 22–32)
CREATININE: 1.06 mg/dL (ref 0.61–1.24)
Calcium: 7.8 mg/dL — ABNORMAL LOW (ref 8.9–10.3)
Chloride: 108 mmol/L (ref 101–111)
GFR calc Af Amer: >60 mL/min (ref >60)
Glucose, Bld: 85 mg/dL (ref 65–99)
POTASSIUM: 3.8 mmol/L (ref 3.5–5.1)
SODIUM: 137 mmol/L (ref 135–145)

## 2017-02-25 LAB — PROTIME-INR
INR: 1.3
Prothrombin Time: 16.1 seconds — ABNORMAL HIGH (ref 11.4–15.2)

## 2017-02-25 LAB — ECHOCARDIOGRAM COMPLETE
HEIGHTINCHES: 70 in
WEIGHTICAEL: 2736 [oz_av]

## 2017-02-25 LAB — MAGNESIUM: Magnesium: 1.8 mg/dL (ref 1.7–2.4)

## 2017-02-25 MED ORDER — ASPIRIN 81 MG PO CHEW
81.0000 mg | CHEWABLE_TABLET | Freq: Every day | ORAL | Status: DC
  Administered 2017-02-26: 81 mg via ORAL
  Filled 2017-02-25: qty 1

## 2017-02-25 MED ORDER — METOPROLOL TARTRATE 12.5 MG HALF TABLET
12.5000 mg | ORAL_TABLET | Freq: Two times a day (BID) | ORAL | Status: DC
  Administered 2017-02-25 – 2017-02-26 (×3): 12.5 mg via ORAL
  Filled 2017-02-25 (×3): qty 1

## 2017-02-25 MED ORDER — FERROUS GLUCONATE 324 (38 FE) MG PO TABS
324.0000 mg | ORAL_TABLET | Freq: Two times a day (BID) | ORAL | Status: DC
  Administered 2017-02-25 – 2017-02-26 (×3): 324 mg via ORAL
  Filled 2017-02-25 (×4): qty 1

## 2017-02-25 NOTE — Progress Notes (Signed)
CARDIAC REHAB PHASE I   PRE:  Rate/Rhythm: 86 afib  BP:  Supine:   Sitting: 122/70  Standing:    SaO2: 95% 2L  MODE:  Ambulation: 270 ft   POST:  Rate/Rhythm: 107 afib  BP:  Supine:   Sitting: 159/75  Standing:    SaO2: 95%RA 1345-1415 Pt walked 270 ft on RA with rolling walker. No c/o breathing but his hip was hurting and limiting distance. Stated when his back acts up that it affects his hip. Left off oxygen. RN aware. To recliner with call bell. Has walker at home.   Graylon Good, RN BSN  02/25/2017 2:15 PM

## 2017-02-25 NOTE — Progress Notes (Signed)
1 Day Post-Op Procedure(s) (LRB): TRANSCATHETER AORTIC VALVE REPLACEMENT, TRANSFEMORAL using a 23mm Edwards Sapien 3 Aortic Valve (N/A) TRANSESOPHAGEAL ECHOCARDIOGRAM (TEE) (N/A) Subjective: No complaints. Ambulated around the ICU without shortness of breath or chest pain.   Objective: Vital signs in last 24 hours: Temp:  [97.4 F (36.3 C)-98.7 F (37.1 C)] 98.5 F (36.9 C) (09/05 0400) Pulse Rate:  [0-214] 94 (09/05 0715) Cardiac Rhythm: Atrial fibrillation (09/05 0400) Resp:  [10-21] 12 (09/05 0715) BP: (92-148)/(51-79) 148/77 (09/05 0700) SpO2:  [92 %-100 %] 94 % (09/05 0715) Weight:  [77.6 kg (171 lb)-80.4 kg (177 lb 4 oz)] 77.6 kg (171 lb) (09/05 0600)  Hemodynamic parameters for last 24 hours:    Intake/Output from previous day: 09/04 0701 - 09/05 0700 In: 1369.3 [I.V.:1319.3; IV Piggyback:50] Out: 2595 [Urine:1430; Blood:25] Intake/Output this shift: No intake/output data recorded.  General appearance: alert and cooperative Neurologic: intact Heart: irregularly irregular rhythm and no murmur Lungs: clear to auscultation bilaterally Abdomen: soft, non-tender; bowel sounds normal; no masses,  no organomegaly Extremities: extremities normal, atraumatic, no cyanosis or edema Wound: groin access sites look good.  Lab Results:  Recent Labs  02/24/17 1318 02/25/17 0442  WBC 5.8 5.9  HGB 8.2*  8.5* 7.9*  HCT 27.2*  25.0* 26.8*  PLT 151 151   BMET:  Recent Labs  02/24/17 0521  02/24/17 1250 02/24/17 1318 02/25/17 0442  NA 139  < > 140 142 137  K 4.1  < > 4.3 3.9 3.8  CL 108  < > 104  --  108  CO2 23  --   --   --  23  GLUCOSE 97  < > 111* 106* 85  BUN 23*  < > 22*  --  18  CREATININE 1.21  < > 1.00  --  1.06  CALCIUM 8.6*  --   --   --  7.8*  < > = values in this interval not displayed.  PT/INR:  Recent Labs  02/25/17 0442  LABPROT 16.1*  INR 1.30   ABG    Component Value Date/Time   PHART 7.368 02/24/2017 1323   HCO3 22.4 02/24/2017 1323   TCO2 24 02/24/2017 1323   ACIDBASEDEF 3.0 (H) 02/24/2017 1323   O2SAT 93.0 02/24/2017 1323   CBG (last 3)  No results for input(s): GLUCAP in the last 72 hours.  ECG: atrial fib, non-specific changes.  Assessment/Plan: S/P Procedure(s) (LRB): TRANSCATHETER AORTIC VALVE REPLACEMENT, TRANSFEMORAL using a 48mm Edwards Sapien 3 Aortic Valve (N/A) TRANSESOPHAGEAL ECHOCARDIOGRAM (TEE) (N/A)  POD 1  He is hemodynamically stable and feels well Persistent atrial fib with controlled rate. Plan to use Plavix and ASA 81 mg in this patient with recent GI bleed on Eliquis presumed due to small gastric ulcers. Acute blood loss anemia due to preop GI bleed. Hgb fairly stable, down a little from preop due to expected blood loss and dilution. He is asymptomatic so we can avoid transfusion and start Fe2+  DC central line Plan 2D echo today. Can transfer to 4E if bed becomes available.   LOS: 5 days    Gaye Pollack 02/25/2017

## 2017-02-25 NOTE — Progress Notes (Signed)
  Echocardiogram 2D Echocardiogram has been performed.  [name] [name] T [name] [name] 02/25/2017, 11:07 AM

## 2017-02-25 NOTE — Progress Notes (Signed)
Progress Note  Patient Name: [name] [name] Date of Encounter: 02/25/2017  Primary Cardiologist: Bronson Ing  Subjective   Feels well this morning. Was able to ambulate around the ICU with much improved breathing. Denies chest pain.  Inpatient Medications    Scheduled Meds: . alfuzosin  10 mg Oral QHS  . atorvastatin  40 mg Oral Daily  . chlorhexidine  15 mL Mouth/Throat NOW  . cholecalciferol  1,000 Units Oral Daily  . clopidogrel  75 mg Oral Q breakfast  . ferrous gluconate  324 mg Oral BID WC  . levothyroxine  75 mcg Oral QAC breakfast  . pantoprazole  40 mg Oral BID  . sodium chloride flush  3 mL Intravenous Q12H   Continuous Infusions: . sodium chloride    . albumin human    . cefUROXime (ZINACEF)  IV Stopped (02/24/17 2323)  . lactated ringers    . nitroGLYCERIN     PRN Meds: sodium chloride, acetaminophen **OR** acetaminophen, albumin human, cyclobenzaprine, HYDROcodone-acetaminophen, lactated ringers, metoprolol tartrate, MUSCLE RUB, ondansetron **OR** ondansetron (ZOFRAN) IV, oxyCODONE, sodium chloride flush, traMADol   Vital Signs    Vitals:   02/25/17 0500 02/25/17 0600 02/25/17 0700 02/25/17 0715  BP: 117/62 123/79 (!) 148/77   Pulse: 87 92 (!) 102 94  Resp: 16 17 10 12   Temp:    98.2 F (36.8 C)  TempSrc:    Oral  SpO2: 94% 93% 95% 94%  Weight:  171 lb (77.6 kg)    Height:        Intake/Output Summary (Last 24 hours) at 02/25/17 0829 Last data filed at 02/25/17 0600  Gross per 24 hour  Intake          1369.25 ml  Output             1455 ml  Net           -85.75 ml   Filed Weights   02/24/17 0253 02/25/17 0400 02/25/17 0600  Weight: 167 lb 5.3 oz (75.9 kg) 177 lb 4 oz (80.4 kg) 171 lb (77.6 kg)    Telemetry    Atrial fibrillation, heart rate 90-105 bpm - Personally Reviewed  ECG    Atrial fibrillation 95 bpm, nonspecific ST abnormality - Personally Reviewed  Physical Exam  Alert, oriented male in no distress GEN: No acute  distress.   Neck: No JVD, right internal jugular central line in place Cardiac:  irregularly irregular, no murmurs, rubs, or gallops.  Respiratory: Clear to auscultation bilaterally. GI: Soft, nontender, non-distended  MS: No edema; No deformity. Bilateral groin sites clear Neuro:  Nonfocal  Psych: Normal affect   Labs    Chemistry Recent Labs Lab 02/20/17 1021  02/23/17 0415 02/24/17 0521  02/24/17 1219 02/24/17 1250 02/24/17 1318 02/25/17 0442  NA 134*  < > 140 139  < > 140 140 142 137  K 4.2  < > 3.6 4.1  < > 4.1 4.3 3.9 3.8  CL 105  < > 109 108  < > 105 104  --  108  CO2 17*  < > 25 23  --   --   --   --  23  GLUCOSE 135*  < > 100* 97  < > 112* 111* 106* 85  BUN 30*  < > 18 23*  < > 22* 22*  --  18  CREATININE 1.51*  < > 1.22 1.21  < > 1.00 1.00  --  1.06  CALCIUM 8.9  < >  8.3* 8.6*  --   --   --   --  7.8*  PROT 7.1  --   --   --   --   --   --   --   --   ALBUMIN 3.7  --   --   --   --   --   --   --   --   AST 62*  --   --   --   --   --   --   --   --   ALT 50  --   --   --   --   --   --   --   --   ALKPHOS 74  --   --   --   --   --   --   --   --   BILITOT 1.4*  --   --   --   --   --   --   --   --   GFRNONAA 43*  < > 55* 56*  --   --   --   --  >60  GFRAA 50*  < > >60 >60  --   --   --   --  >60  ANIONGAP 12  < > 6 8  --   --   --   --  6  < > = values in this interval not displayed.   Hematology Recent Labs Lab 02/24/17 0521  02/24/17 1250 02/24/17 1318 02/25/17 0442  WBC 6.3  --   --  5.8 5.9  RBC 3.62*  --   --  3.24* 3.16*  HGB 9.1*  < > 8.5* 8.2*  8.5* 7.9*  HCT 30.6*  < > 25.0* 27.2*  25.0* 26.8*  MCV 84.5  --   --  84.0 84.8  MCH 25.1*  --   --  25.3* 25.0*  MCHC 29.7*  --   --  30.1 29.5*  RDW 20.2*  --   --  20.3* 20.0*  PLT 176  --   --  151 151  < > = values in this interval not displayed.  Cardiac Enzymes Recent Labs Lab 02/20/17 1223 02/21/17 0220 02/21/17 0357  TROPONINI 0.24* 0.37* 0.53*   No results for input(s): TROPIPOC  in the last 168 hours.   BNPNo results for input(s): BNP, PROBNP in the last 168 hours.   DDimer No results for input(s): DDIMER in the last 168 hours.   Radiology    Dg Chest Port 1 View  Result Date: 02/25/2017 CLINICAL DATA:  76 year old male status post TAVR. EXAM: PORTABLE CHEST 1 VIEW COMPARISON:  02/24/2017 and earlier. FINDINGS: Portable AP semi upright view at at 0557 hours. Stable right IJ central line. Stable cardiac size and mediastinal contours. Prosthetic aortic valve. Calcified aortic atherosclerosis. Stable pulmonary vascularity. No overt edema. No pneumothorax, pleural effusion or consolidation. IMPRESSION: Stable right IJ central line.  No acute cardiopulmonary abnormality. Electronically Signed   By: Genevie Ann M.D.   On: 02/25/2017 07:52   Dg Chest Port 1 View  Result Date: 02/24/2017 CLINICAL DATA:  Status post transcatheter aortic valve replacement. EXAM: PORTABLE CHEST 1 VIEW COMPARISON:  02/20/2017. FINDINGS: Interval transcatheter aortic valve replacement. Right jugular catheter tip in the superior vena cava without pneumothorax. Mildly progressive enlargement of the cardiac silhouette and accentuation of the interstitial markings without airspace consolidation. The previously demonstrated small right upper lobe mass is partially obscured by  the increased interstitial prominence. No pleural fluid. Lower thoracic spine degenerative changes. IMPRESSION: 1. Mildly progressive cardiomegaly with mild interstitial pulmonary edema superimposed on mild chronic interstitial lung disease. 2. Partially obscured previously demonstrated right upper lobe mass. Electronically Signed   By: Claudie Revering M.D.   On: 02/24/2017 14:07    Cardiac Studies   Postoperative day #1 echocardiogram pending  Patient Profile     76 y.o. male with severe symptomatic aortic stenosis (stage DI) admitted with symptomatic anemia/upper GI bleed, now stabilized after packed red blood cell transfusion and  treated with TAVR 02/24/2017  Assessment & Plan    1. Acute blood loss anemia: Hemoglobin slightly decreased likely related to dilution and mild operative blood loss. Patient appears to be asymptomatic. Start iron. No indication for blood transfusion. Continue PPI for treatment of ulcer disease.  2. Severe, stage DI aortic stenosis: Doing very well postoperative day #1 from TAVR. Continue aspirin 81 mg. Add Plavix 75 mg daily. Postop Echo today. Mobilize and transfer to a telemetry bed.  3. Acute diastolic heart failure: Clinically improved with packed red blood cell transfusion, IV diuretics, and now with treatment of his aortic stenosis.  4. Persistent atrial fibrillation: We will add a beta blocker today. Not a candidate for anticoagulation with recent GI bleed.  Disposition: Transfer to 4 E. today. Anticipate discharge home tomorrow if stable. Will start iron replacement and repeat hemoglobin/hematocrit in the morning.  Deatra James, MD  02/25/2017, 8:29 AM

## 2017-02-25 NOTE — Progress Notes (Signed)
Patient arrived on the unit from [place], assessment  completed see flowsheet, patient oriented to room and staff, placed on tele ccmd notified, bed in lowest position, call bell within reach will continue to monitor.

## 2017-02-25 NOTE — Care Management Note (Addendum)
Case Management Note Marvetta Gibbons RN, BSN Unit 4E-Case Manager-- [place] coverage [phone]  Patient Details  Name: [name] [name] MRN: [id number] Date of Birth: 05-Jun-1941  Subjective/Objective:  Pt admitted with Worsening dyspnea on exertion, low hemoglobin now s/p TAVR on 02/24/17                  Action/Plan: PTA pt lived at home with wife- anticipate return home- CM will follow for d/c needs  Expected Discharge Date:  02/26/17               Expected Discharge Plan:  Home/Self Care  In-House Referral:     Discharge planning Services  CM Consult  Post Acute Care Choice:    Choice offered to:     DME Arranged:    DME Agency:     HH Arranged:    [place] Agency:     Status of Service:  In process, will continue to follow  If discussed at Long Length of Stay Meetings, dates discussed:    Discharge Disposition:   Additional Comments:  Dawayne Patricia, RN 02/25/2017, 10:26 AM

## 2017-02-25 NOTE — Op Note (Signed)
[facility]   MULTIDISCIPLINARY HEART VALVE TEAM   TAVR OPERATIVE NOTE   Date of Procedure:  02/24/2017  Preoperative Diagnosis: Severe Aortic Stenosis   Postoperative Diagnosis: Same   Procedure:    Transcatheter Aortic Valve Replacement - Percutaneous Right Transfemoral Approach  Edwards Sapien 3 THV (size 26 mm, model # 9600TFX, serial # [id number])   Co-Surgeons: Gaye Pollack, MD and Sherren Mocha, MD   Anesthesiologist:  Perfecto Kingdom, MD  Echocardiographer:  Ena Dawley, MD  Pre-operative Echo Findings:  Severe aortic stenosis  Normal left ventricular systolic function  Post-operative Echo Findings:  trace paravalvular leak  Normal left ventricular systolic function   BRIEF CLINICAL NOTE AND INDICATIONS FOR SURGERY  The patient is a 76 year old gentleman with hypertension, hyperlipidemia, COPD with a 59 pk-yr smoking history until he quit 11 years ago, AAA s/p stent graft repair in 2011, bladder cancer, and aortic stenosis followed by echo. He recently saw Dr. Roxan Hockey for evaluation of a 17 mm spiculated nodule in the RUL that was hypermetabolic on PET scan. This was found on a lung cancer screening CT on 11/10/2016. There was no pathologic adenopathy. His most recent echo on 10/28/2016 showed a mean gradient of 55 mm Hg with normal LV function. Cath on 01/14/2017 showed 60% proximal LAD stenosis with mild non-obstructive disease in the LCX and RCA. Dr. Burt Knack felt that his LAD stenosis could be managed medically.  He has stage D, severe, symptomatic aortic stenosis with NYHA class ll symptoms of exertional fatigue and shortness of breath consistent with chronic diastolic heart failure. I have personally reviewed and interpreted his echo, cath, and CTA studies. The aortic valve is trileaflet with severe thickening and calcification of the leaflets with reduced mobility. LVEF is normal. Cardiac cath shows a 60% proximal LAD stenosis and otherwise  non-obstructive disease. He does report some exertional chest tightness but this lesion does not look tight enough to be causing much problem. I think AVR is indicated to improve his symptoms and prevent progressive LV deterioration. His risk for open surgical AVR would be at least moderately elevated due to his age and comorbid medical problems. His chest CT shows near circumferential calcified plaque in the ascending aorta and aortic arch which would significantly increase the risk of open surgery and would probably require replacement of the aortic root and ascending aorta. He also has a RUL lung nodule that is most likely a lung cancer and is amenable to surgical removal but an open AVR would delay that surgery for months. I think TAVR would be the best alternative for him. His gated cardiac CT shows anatomy suitable for a Sapien 3 valve and his abdominal and pelvic CT shows suitable vasculature for transfemoral access although he does have significant calcified plaque in the common femoral arteries and a bifurcated stent graft in the abdominal aorta and common iliac arteries.  The patient was counseled at length regarding treatment alternatives for management of severe symptomatic aortic stenosis. The risks and benefits of surgical intervention has been discussed in detail. Long-term prognosis with medical therapy was discussed. Alternative approaches such as conventional surgical aortic valve replacement, transcatheter aortic valve replacement, and palliative medical therapy were compared and contrasted at length. This discussion was placed in the context of the patient's own specific clinical presentation and past medical history. All of their questions been addressed. The patient is eager to proceed with TAVR.   Following the decision to proceed with transcatheter aortic valve replacement,  a discussion was held regarding what types of management strategies would be attempted intraoperatively in the  event of life-threatening complications, including whether or not the patient would be considered a candidate for the use of cardiopulmonary bypass and/or conversion to open sternotomy for attempted surgical intervention. The patient has been advised of a variety of complications that might develop including but not limited to risks of death, stroke, paravalvular leak, aortic dissection or other major vascular complications, aortic annulus rupture, device embolization, cardiac rupture or perforation, mitral regurgitation, acute myocardial infarction, arrhythmia, heart block or bradycardia requiring permanent pacemaker placement, congestive heart failure, respiratory failure, renal failure, pneumonia, infection, other late complications related to structural valve deterioration or migration, or other complications that might ultimately cause a temporary or permanent loss of functional independence or other long term morbidity. The patient provides full informed consent for the procedure as described and all questions were answered.      DETAILS OF THE OPERATIVE PROCEDURE  PREPARATION:    The patient is brought to the operating room on the above mentioned date and central monitoring was established by the anesthesia team including placement of a central venous line and radial arterial line. The patient is placed in the supine position on the operating table.  Intravenous antibiotics are administered. The patient is monitored closely throughout the procedure under conscious sedation.    Baseline transthoracic echocardiogram was performed. The patient's abdomen, both groins, and both lower extremities are prepared and draped in a sterile manner. A time out procedure is performed.   PERIPHERAL ACCESS:    Using the modified Seldinger technique, femoral arterial and venous access was obtained with placement of 6 Fr sheaths on the left side.  A pigtail diagnostic catheter was passed through the left arterial  sheath under fluoroscopic guidance into the aortic root.  A temporary transvenous pacemaker catheter was passed through the left femoral venous sheath under fluoroscopic guidance into the right ventricle.  The pacemaker was tested to ensure stable lead placement and pacemaker capture. Aortic root angiography was performed in order to determine the optimal angiographic angle for valve deployment.   TRANSFEMORAL ACCESS:   Percutaneous transfemoral access and sheath placement was performed by Dr. Burt Knack using ultrasound guidance.  The right common femoral artery was cannulated using a micropuncture needle and appropriate location was verified using hand injection angiogram.  A pair of Abbott Perclose percutaneous closure devices were placed and a 6 French sheath replaced into the femoral artery.  The patient was heparinized systemically and ACT verified > 250 seconds.    A 14 Fr transfemoral E-sheath was introduced into the right femoral artery after progressively dilating over an Amplatz superstiff wire. An AL-2 catheter was used to direct a straight-tip exchange length wire across the native aortic valve into the left ventricle. This was exchanged out for a pigtail catheter and position was confirmed in the LV apex.  The pigtail catheter was exchanged for an Amplatz Extra-stiff wire in the LV apex.  Echocardiography was utilized to confirm appropriate wire position and no sign of entanglement in the mitral subvalvular apparatus.   BALLOON AORTIC VALVULOPLASTY:   Not performed  TRANSCATHETER HEART VALVE DEPLOYMENT:   An Edwards Sapien 3 transcatheter heart valve (size 26 mm, model #9600TFX, serial #[id number]) was prepared and crimped per manufacturer's guidelines, and the proper orientation of the valve is confirmed on the [employer] delivery system. The valve was advanced through the introducer sheath using normal technique until in an appropriate position in the  abdominal aorta beyond the sheath  tip. The balloon was then retracted and using the fine-tuning wheel was centered on the valve. The valve was then advanced across the aortic arch using appropriate flexion of the catheter. The valve was carefully positioned across the aortic valve annulus. The Commander catheter was retracted using normal technique. Once final position of the valve has been confirmed by angiographic assessment, the valve is deployed while temporarily holding ventilation and during rapid ventricular pacing to maintain systolic blood pressure < 50 mmHg and pulse pressure < 10 mmHg. The balloon inflation is held for >3 seconds after reaching full deployment volume. Once the balloon has fully deflated the balloon is retracted into the ascending aorta and valve function is assessed using echocardiography. There is felt to be trace paravalvular leak and no central aortic insufficiency.  The patient's hemodynamic recovery following valve deployment is good.  The deployment balloon and guidewire are both removed.    PROCEDURE COMPLETION:   The sheath was removed and femoral artery closure performed by Dr Burt Knack.  Protamine was administered once femoral arterial repair was complete. The temporary pacemaker, pigtail catheters and femoral sheaths were removed with manual pressure used for hemostasis.   The patient tolerated the procedure well and is transported to the surgical intensive care in stable condition. There were no immediate intraoperative complications. All sponge instrument and needle counts are verified correct at completion of the operation.   No blood products were administered during the operation.  The patient received a total of 40 mL of intravenous contrast during the procedure.   Gaye Pollack, MD 02/24/2017

## 2017-02-26 ENCOUNTER — Encounter (HOSPITAL_COMMUNITY): Payer: Self-pay | Admitting: Physician Assistant

## 2017-02-26 LAB — CBC
HEMATOCRIT: 27.4 % — AB (ref 39.0–52.0)
HEMOGLOBIN: 8.2 g/dL — AB (ref 13.0–17.0)
MCH: 25.2 pg — ABNORMAL LOW (ref 26.0–34.0)
MCHC: 29.9 g/dL — AB (ref 30.0–36.0)
MCV: 84.3 fL (ref 78.0–100.0)
Platelets: 154 10*3/uL (ref 150–400)
RBC: 3.25 MIL/uL — ABNORMAL LOW (ref 4.22–5.81)
RDW: 19.7 % — ABNORMAL HIGH (ref 11.5–15.5)
WBC: 5.6 10*3/uL (ref 4.0–10.5)

## 2017-02-26 LAB — BASIC METABOLIC PANEL
ANION GAP: 7 (ref 5–15)
BUN: 16 mg/dL (ref 6–20)
CALCIUM: 8.6 mg/dL — AB (ref 8.9–10.3)
CO2: 23 mmol/L (ref 22–32)
Chloride: 102 mmol/L (ref 101–111)
Creatinine, Ser: 1.08 mg/dL (ref 0.61–1.24)
GFR calc Af Amer: >60 mL/min (ref >60)
GLUCOSE: 120 mg/dL — AB (ref 65–99)
POTASSIUM: 3.9 mmol/L (ref 3.5–5.1)
SODIUM: 132 mmol/L — AB (ref 135–145)

## 2017-02-26 MED ORDER — ASPIRIN 81 MG PO CHEW: 81.0000 mg | CHEWABLE_TABLET | Freq: Every day | ORAL | Status: DC

## 2017-02-26 MED ORDER — METOPROLOL TARTRATE 25 MG PO TABS: 12.5000 mg | ORAL_TABLET | Freq: Two times a day (BID) | ORAL | 6 refills | Status: DC

## 2017-02-26 MED ORDER — FERROUS GLUCONATE 324 (38 FE) MG PO TABS: 324.0000 mg | ORAL_TABLET | Freq: Two times a day (BID) | ORAL | 6 refills | Status: DC

## 2017-02-26 MED ORDER — CLOPIDOGREL BISULFATE 75 MG PO TABS: 75.0000 mg | ORAL_TABLET | Freq: Every day | ORAL | 6 refills | Status: DC

## 2017-02-26 MED ORDER — PANTOPRAZOLE SODIUM 40 MG PO TBEC: 40.0000 mg | DELAYED_RELEASE_TABLET | Freq: Two times a day (BID) | ORAL | 6 refills | Status: DC

## 2017-02-26 NOTE — Progress Notes (Signed)
Patient Name: [name] [name] Date of Encounter: 02/26/2017  Primary Cardiologist: Dr. Marca Ancona Problem List     Principal Problem:   Symptomatic anemia Active Problems:   Atrial fibrillation ([place])   Hyperlipidemia   Hypertension   Severe aortic stenosis   Lesion of right lung   Acute kidney injury superimposed on chronic kidney disease (HCC)   Elevated troponin   Cirrhosis (HCC)   BPH (benign prostatic hyperplasia)   Abnormal urinalysis   Acute diastolic heart failure ([place])   Melena   Multiple gastric ulcers     Subjective   Feeling great. He can already tell a difference in his breathing. Has been up ambulating with no issues. Ready to go home.   Inpatient Medications    Scheduled Meds: . alfuzosin  10 mg Oral QHS  . aspirin  81 mg Oral Daily  . atorvastatin  40 mg Oral Daily  . cholecalciferol  1,000 Units Oral Daily  . clopidogrel  75 mg Oral Q breakfast  . ferrous gluconate  324 mg Oral BID WC  . levothyroxine  75 mcg Oral QAC breakfast  . metoprolol tartrate  12.5 mg Oral BID  . pantoprazole  40 mg Oral BID  . sodium chloride flush  3 mL Intravenous Q12H   Continuous Infusions: . sodium chloride    . cefUROXime (ZINACEF)  IV Stopped (02/26/17 0129)  . lactated ringers     PRN Meds: sodium chloride, acetaminophen **OR** acetaminophen, cyclobenzaprine, lactated ringers, metoprolol tartrate, MUSCLE RUB, ondansetron **OR** ondansetron (ZOFRAN) IV, sodium chloride flush, traMADol   Vital Signs    Vitals:   02/25/17 1200 02/25/17 1536 02/25/17 2000 02/26/17 0500  BP: 123/68 121/63 107/83 134/68  Pulse: 91 88 91 (!) 55  Resp: 12 10 14 12   Temp: 98.7 F (37.1 C) 98.8 F (37.1 C) 98.9 F (37.2 C) 97.8 F (36.6 C)  TempSrc: Oral Oral Oral Oral  SpO2: 98% 92% 96% 91%  Weight:    176 lb 5.9 oz (80 kg)  Height:        Intake/Output Summary (Last 24 hours) at 02/26/17 0749 Last data filed at 02/26/17 0500  Gross per 24 hour  Intake               750 ml  Output              900 ml  Net             -150 ml   Filed Weights   02/25/17 0400 02/25/17 0600 02/26/17 0500  Weight: 177 lb 4 oz (80.4 kg) 171 lb (77.6 kg) 176 lb 5.9 oz (80 kg)    Physical Exam   GEN: Well nourished, well developed, in no acute distress.  HEENT: Grossly normal.  Neck: Supple, no JVD, carotid bruits, or masses. Cardiac: RRR, no murmurs, rubs, or gallops. No clubbing, cyanosis, edema.  Radials/DP/PT 2+ and equal bilaterally.  Respiratory:  Respirations regular and unlabored, clear to auscultation bilaterally. GI: Soft, nontender, nondistended, BS + x 4. MS: no deformity or atrophy. Skin: warm and dry, no rash. Bilateral groin sites clear Neuro:  Strength and sensation are intact. Psych: AAOx3.  Normal affect.  Labs    CBC  Recent Labs  02/25/17 0442 02/26/17 0223  WBC 5.9 5.6  HGB 7.9* 8.2*  HCT 26.8* 27.4*  MCV 84.8 84.3  PLT 151 073   Basic Metabolic Panel  Recent Labs  02/25/17 0442 02/26/17 0223  NA 137  132*  K 3.8 3.9  CL 108 102  CO2 23 23  GLUCOSE 85 120*  BUN 18 16  CREATININE 1.06 1.08  CALCIUM 7.8* 8.6*  MG 1.8  --    Liver Function Tests No results for input(s): AST, ALT, ALKPHOS, BILITOT, PROT, ALBUMIN in the last 72 hours. No results for input(s): LIPASE, AMYLASE in the last 72 hours. Cardiac Enzymes No results for input(s): CKTOTAL, CKMB, CKMBINDEX, TROPONINI in the last 72 hours. BNP Invalid input(s): POCBNP D-Dimer No results for input(s): DDIMER in the last 72 hours. Hemoglobin A1C No results for input(s): HGBA1C in the last 72 hours. Fasting Lipid Panel No results for input(s): CHOL, HDL, LDLCALC, TRIG, CHOLHDL, LDLDIRECT in the last 72 hours. Thyroid Function Tests No results for input(s): TSH, T4TOTAL, T3FREE, THYROIDAB in the last 72 hours.  Invalid input(s): FREET3  Telemetry    persistnt atrial fibrillation with good rate control - Personally Reviewed  ECG    afib with nonspecific  ST/TW changes - Personally Reviewed  Radiology    Dg Chest Port 1 View  Result Date: 02/25/2017 CLINICAL DATA:  76 year old male status post TAVR. EXAM: PORTABLE CHEST 1 VIEW COMPARISON:  02/24/2017 and earlier. FINDINGS: Portable AP semi upright view at at 0557 hours. Stable right IJ central line. Stable cardiac size and mediastinal contours. Prosthetic aortic valve. Calcified aortic atherosclerosis. Stable pulmonary vascularity. No overt edema. No pneumothorax, pleural effusion or consolidation. IMPRESSION: Stable right IJ central line.  No acute cardiopulmonary abnormality. Electronically Signed   By: Genevie Ann M.D.   On: 02/25/2017 07:52   Dg Chest Port 1 View  Result Date: 02/24/2017 CLINICAL DATA:  Status post transcatheter aortic valve replacement. EXAM: PORTABLE CHEST 1 VIEW COMPARISON:  02/20/2017. FINDINGS: Interval transcatheter aortic valve replacement. Right jugular catheter tip in the superior vena cava without pneumothorax. Mildly progressive enlargement of the cardiac silhouette and accentuation of the interstitial markings without airspace consolidation. The previously demonstrated small right upper lobe mass is partially obscured by the increased interstitial prominence. No pleural fluid. Lower thoracic spine degenerative changes. IMPRESSION: 1. Mildly progressive cardiomegaly with mild interstitial pulmonary edema superimposed on mild chronic interstitial lung disease. 2. Partially obscured previously demonstrated right upper lobe mass. Electronically Signed   By: Claudie Revering M.D.   On: 02/24/2017 14:07    Cardiac Studies   2D ECHO: 02/25/2017 Study Conclusions - Left ventricle: The cavity size was normal. Wall thickness was   increased in a pattern of mild LVH. Systolic function was normal.   The estimated ejection fraction was in the range of 50% to 55%.   Wall motion was normal; there were no regional wall motion   abnormalities. - Aortic valve: A bioprosthesis was present.  There was trivial   regurgitation. Valve area (VTI): 1.56 cm^2. Valve area (Vmax):   1.44 cm^2. Valve area (Vmean): 1.7 cm^2. - Mitral valve: Calcified annulus. There was mild regurgitation. - Left atrium: The atrium was severely dilated. - Right atrium: The atrium was moderately dilated. - Pulmonary arteries: Systolic pressure was mildly increased. Impressions: - Normal LV systolic function; mild LVH; s/p TAVR with mean   gradient 9 mmHg and trivial AI; mild MR; biatrial enlargment;   mild TR with mildly elevated pulmonary pressure.   Patient Profile     [name] [name] is a 76 y.o. male with a history of HTN, HLD, previous heavy tobacco abuse, COPD, PAD, AAA s/p stent graft repair (2011), bladder cancer s/p TURBT, RUL hypermetabolic pulmonary nodule  concerning for bronchogenic neoplasm, recently diagnosed persistent atrial fibrillation and severe AS with planned TAVR next week who was admitted 02/20/17 with symptomatic anemia and UGIB.   Assessment & Plan    Severe, stage DI aortic stenosis: Doing very well postoperative day #2 from TAVR. Continue aspirin 81 mg and Plavix 75 mg daily. Postop Echo 02/25/17 with good valve placement and trivial PVL. Tele with no bradyarrhythmia. He has been ambulating with no dyspnea/chest pain. Groin sites stable.  Acute blood loss anemia:  he was transfused PRBCs. H/H stable at 8.2/27.4. EGD on 02/21/17 showed non bleeding gastric ulcers. It was felt that with previous use of Eliquis, that this was likely the source of melena and anemia. He was started on iron and a PPI for treatment of ulcer disease. Will follow blood counts as an outpatient   Acute diastolic heart failure: clinically improved with packed red blood cell transfusion, IV diuretics, and now with treatment of his aortic stenosis.  Persistent atrial fibrillation: we added lopressor 12.5mg  BID and he has been well rate controlled. CHADSVASC of at least 5 (CHF, HTN, age, vasc dz). Not a  candidate for anticoagulation with recent GI bleed.  HTN: BP under excellent control   Dispo:  Plan for DC home today with 1 week follow up and 1 month follow up with repeat echo.   Signed, Angelena Form, PA-C  02/26/2017, 7:49 AM   Patient seen, examined. Available data reviewed. Agree with findings, assessment, and plan as outlined by Nell Range, PA-C. On exam the patient is alert and oriented, in no distress. Lungs are clear. Heart is irregularly irregular with no murmur or gallop. Abdomen is soft and nontender. Bilateral groin sites are clear. There is no peripheral edema.  The patient is doing very well. Telemetry is reviewed and shows good rate control of his atrial fibrillation on low-dose metoprolol. We are holding off on anticoagulation because of his GI bleed at presentation. He will remain on dual antiplatelet therapy with aspirin and Plavix after undergoing TAVR. He will have a follow-up CBC in 1 week as outlined. The patient appears stable for hospital discharge.  Sherren Mocha, M.D. 02/26/2017 2:11 PM

## 2017-02-26 NOTE — Discharge Summary (Signed)
Discharge Summary    Patient ID: [name] [name],  MRN: [id number], DOB/AGE: 1941-01-09 76 y.o.  Admit date: 02/20/2017 Discharge date: 02/26/2017  Primary Care Provider: Hassell Done, [name] Primary Cardiologist: Dr. Bronson Ing    Discharge Diagnoses    Principal Problem:   Symptomatic anemia Active Problems:   Atrial fibrillation ([place])   Hyperlipidemia   Hypertension   Severe aortic stenosis   Lesion of right lung   Acute kidney injury superimposed on chronic kidney disease (HCC)   Elevated troponin   BPH (benign prostatic hyperplasia)   Acute diastolic heart failure ([place])   Melena   Multiple gastric ulcers   Allergies No Known Allergies   History of Present Illness     [name] [name] is a 76 y.o. male with a history of HTN, HLD, previous heavy tobacco abuse, COPD, PAD, AAA s/p stent graft repair (2011), bladder cancer s/p TURBT, RUL hypermetabolic pulmonary nodule concerning for bronchogenic neoplasm, recently diagnosed persistent atrial fibrillation and severe AS with planned TAVR next weekwho was admitted 02/20/17 with symptomatic anemia and UGIB.  His most recent echo on 10/28/2016 showed severe AS with a mean gradient of 55 mm Hg with normal LV function.   [name] recently saw Dr. Roxan Hockey for evaluation of a 17 mm spiculated nodule in the RUL that was hypermetabolic on PET scan. This was found on a lung cancer screening CT on 11/10/2016. There was no pathologic adenopathy. It was felt to be most likely a lung cancer that would eventually require surgical removal. It was felt he would not tolerate surgery with severe AS and he was referred for TAVR. Plan was to consider thoracic surgery for hypermetabolic lung nodule once he recovered from TAVR.   Cath on 01/14/2017 showed 60% proximal LAD stenosis with mild non-obstructive disease in the LCX and RCA. Dr. Burt Knack felt that his LAD stenosis could be managed medically. He was seen by Dr. Cyndia Bent for surgical  consultation and felt he would be at least moderate risk for SAVR and recommended TAVR. During that office visit he was noted to be in atrial fibrillation and started on Eliquis 33m BID. He tolerated this well but earlier this week it caused nausea and decreased appetite. He was told to stop Eliquis and his last dose was 8/28. His TAVR date was set up for 02/24/17.  [facility] admission testing appointment in anticipation of TAVR the following week. ECG showed afib with new ST/TW changes in the anterolateral leads and pre operative labs showed acute anemia with Hg down to 5.9. He admitted to weakness, worsening DOE, chest pain as well as melena. [facility]   Hospital Course     Consultants: none  Acute blood loss anemia: he was transfused PRBCs. H/H stable at 8.2/27.4. EGD on 02/21/17 showed non bleeding gastric ulcers. It was felt that with previous use of Eliquis, that this was likely the source of melena and anemia. He was started on iron and a PPI for treatment of ulcer disease. Will follow blood counts as an outpatient   Severe, stage DI aortic stenosis: he underwent successful TAVR with a 281mEdwards Sapien valve via the TF approach under MAC on 02/24/17. He is doing very well postoperative day #2. Continue aspirin 81 mg and Plavix 75 mg daily. Postop Echo 02/25/17 showed no residual AS and trivial PVL. Tele with no bradyarrhythmia. He has been  ambulating with no dyspnea/chest pain. Groin sites stable.  Acute diastolic heart failure: clinically improved with packed red blood cell transfusion, IV diuretics, and now with treatment of his aortic stenosis. He appears euvolemic   Persistent atrial fibrillation: we added lopressor 12.53m BID and he has been well rate controlled. CHADSVASC of at least 5 (CHF, HTN, age, vasc dz). Not a candidate for anticoagulation with recent GI bleed.  HTN: BP under excellent control    AKI: creat bumped to 1.42, but now back to normal.   Dispo: plan for DC home today with 1 week follow up and 1 month follow up with repeat echo  The patient has had an uncomplicated hospital course and is recovering well. The femoral catheter sites are stable. He has been seen by Dr. CBurt Knacktoday and deemed ready for discharge home. All follow-up appointments have been scheduled. Discharge medications are listed below.  _____________  Discharge Vitals Blood pressure 115/63, pulse 92, temperature 97.6 F (36.4 C), temperature source Oral, resp. rate 18, height _0  (1.778 m), weight 176 lb 5.9 oz (80 kg), SpO2 95 %.  Filed Weights   02/25/17 0400 02/25/17 0600 02/26/17 0500  Weight: 177 lb 4 oz (80.4 kg) 171 lb (77.6 kg) 176 lb 5.9 oz (80 kg)    Labs & Radiologic Studies     CBC  Recent Labs  02/25/17 0442 02/26/17 0223  WBC 5.9 5.6  HGB 7.9* 8.2*  HCT 26.8* 27.4*  MCV 84.8 84.3  PLT 151 1371  Basic Metabolic Panel  Recent Labs  02/25/17 0442 02/26/17 0223  NA 137 132*  K 3.8 3.9  CL 108 102  CO2 23 23  GLUCOSE 85 120*  BUN 18 16  CREATININE 1.06 1.08  CALCIUM 7.8* 8.6*  MG 1.8  --    Liver Function Tests No results for input(s): AST, ALT, ALKPHOS, BILITOT, PROT, ALBUMIN in the last 72 hours. No results for input(s): LIPASE, AMYLASE in the last 72 hours. Cardiac Enzymes No results for input(s): CKTOTAL, CKMB, CKMBINDEX, TROPONINI in the last 72 hours. BNP Invalid input(s): POCBNP D-Dimer No results for input(s): DDIMER in the last 72 hours. Hemoglobin A1C No results for input(s): HGBA1C in the last 72 hours. Fasting Lipid Panel No results for input(s): CHOL, HDL, LDLCALC, TRIG, CHOLHDL, LDLDIRECT in the last 72 hours. Thyroid Function Tests No results for input(s): TSH, T4TOTAL, T3FREE, THYROIDAB in the last 72 hours.  Invalid input(s): FREET3  Dg Chest 2 View  Result Date: 02/20/2017 CLINICAL DATA:  Aortic valve repair. EXAM: CHEST  2 VIEW  COMPARISON:  CT 01/29/2017.  PET-CT 12/02/2016. FINDINGS: Mediastinum and hilar structures are normal. Cardiomegaly with normal pulmonary vascularity. Persistent right upper lobe mass. No pleural effusion or pneumothorax. IMPRESSION: Persistent right upper lobe mass. No interim change from prior studies. Electronically Signed   By: TMarcello Moores Register   On: 02/20/2017 13:33   Ct Coronary Morph W/cta Cor WNancy FetterW/ca W/cm &/or Wo/cm  Addendum Date: 01/31/2017   ADDENDUM REPORT: 01/31/2017 11:50 CLINICAL DATA:  76year old male with severe aortic stenosis being evaluated for a TAVR procedure. EXAM: Cardiac TAVR CT TECHNIQUE: The patient was scanned on a [employer] A 120 kV retrospective scan was triggered in the descending thoracic aorta at 111 HU's. Gantry rotation speed was 250 msecs and collimation was .6 mm. No beta blockade or nitro were given. The 3D data set was reconstructed in 5% intervals of the R-R cycle. Systolic and diastolic phases were analyzed on  a dedicated work station using MPR, MIP and VRT modes. The patient received 80 cc of contrast. FINDINGS: Aortic Valve: Trileaflet, severely thickened and symmetrically calcified aortic valve with severely restricted leaflet opening. Mild calcifications extending into LVOT. Aorta: Normal size, mild diffuse calcifications, moderate plaque, no dissection. Sinotubular Junction:  27 x 27 mm Ascending Thoracic Aorta:  33 x 31 mm Aortic Arch:  29 x 26 mm Descending Thoracic Aorta:  26 x 23 mm Sinus of Valsalva Measurements: Non-coronary:  32 mm Right -coronary:  32 mm Left -coronary:  34 mm Coronary Artery Height above Annulus: Left Main:  13 mm Right Coronary:  17 mm Virtual Basal Annulus Measurements: Maximum/Minimum Diameter:  27 x 23 mm Perimeter:  79 mm Area:  483 mm2 Optimum Fluoroscopic Angle for Delivery:  LAO 5 CAU 6 IMPRESSION: 1. Trileaflet, severely thickened and symmetrically calcified aortic valve with severely restricted leaflet opening  and mild calcifications extending into LVOT. Annular measurement suitable for delivery of a 26 mm Edwards-SAPIEN 3 valve. 2.  Sufficient coronary to annulus distance. 3. Optimum Fluoroscopic Angle for Delivery:  LAO 5 CAU 6 4. No thrombus in the left atrial appendage. Ena Dawley Electronically Signed   By: Ena Dawley   On: 01/31/2017 11:50   Result Date: 01/31/2017 EXAM: OVER-READ INTERPRETATION  CT CHEST The following report is an over-read performed by radiologist Dr. Rebekah Chesterfield [facility] on 01/29/2017. This over-read does not include interpretation of cardiac or coronary anatomy or pathology. The coronary calcium score/coronary CTA interpretation by the cardiologist is attached. COMPARISON:  PET-CT 12/02/2016. CT the chest 11/01/2016. CT the abdomen and pelvis 08/05/2015. FINDINGS: Extracardiac findings will be dictated under report for contemporaneously obtained CTA of the chest, abdomen and pelvis 01/29/2017. IMPRESSION: Please see separate dictation for contemporaneously obtained CTA of the chest, abdomen and pelvis 01/29/2017 for full description of relevant extracardiac findings. Electronically Signed: By: Vinnie Langton M.D. On: 01/29/2017 12:47   Dg Chest Port 1 View  Result Date: 02/25/2017 CLINICAL DATA:  76 year old male status post TAVR. EXAM: PORTABLE CHEST 1 VIEW COMPARISON:  02/24/2017 and earlier. FINDINGS: Portable AP semi upright view at at 0557 hours. Stable right IJ central line. Stable cardiac size and mediastinal contours. Prosthetic aortic valve. Calcified aortic atherosclerosis. Stable pulmonary vascularity. No overt edema. No pneumothorax, pleural effusion or consolidation. IMPRESSION: Stable right IJ central line.  No acute cardiopulmonary abnormality. Electronically Signed   By: Genevie Ann M.D.   On: 02/25/2017 07:52   Dg Chest Port 1 View  Result Date: 02/24/2017 CLINICAL DATA:  Status post transcatheter aortic valve replacement. EXAM: PORTABLE  CHEST 1 VIEW COMPARISON:  02/20/2017. FINDINGS: Interval transcatheter aortic valve replacement. Right jugular catheter tip in the superior vena cava without pneumothorax. Mildly progressive enlargement of the cardiac silhouette and accentuation of the interstitial markings without airspace consolidation. The previously demonstrated small right upper lobe mass is partially obscured by the increased interstitial prominence. No pleural fluid. Lower thoracic spine degenerative changes. IMPRESSION: 1. Mildly progressive cardiomegaly with mild interstitial pulmonary edema superimposed on mild chronic interstitial lung disease. 2. Partially obscured previously demonstrated right upper lobe mass. Electronically Signed   By: Claudie Revering M.D.   On: 02/24/2017 14:07   Ct Angio Chest Aorta W/cm &/or Wo/cm  Result Date: 01/29/2017 CLINICAL DATA:  76 year old male with history of severe aortic stenosis. Preprocedural study prior to potential transcatheter aortic valve replacement (TAVR) procedure. EXAM: CT ANGIOGRAPHY CHEST, ABDOMEN AND PELVIS TECHNIQUE: Multidetector CT imaging through the  chest, abdomen and pelvis was performed using the standard protocol during bolus administration of intravenous contrast. Multiplanar reconstructed images and MIPs were obtained and reviewed to evaluate the vascular anatomy. CONTRAST:  80 mL of Isovue 370. COMPARISON:  PET-CT 12/02/2016. CT the chest 11/01/2016. CT the abdomen and pelvis 08/05/2015. FINDINGS: CTA CHEST FINDINGS Cardiovascular: Heart size is moderately enlarged with biatrial dilatation. Filling defect in the left atrial appendage (axial image 55 of series 14), concerning for potential left atrial appendage thrombus. There is no significant pericardial fluid, thickening or pericardial calcification. There is aortic atherosclerosis, as well as atherosclerosis of the great vessels of the mediastinum and the coronary arteries, including calcified atherosclerotic plaque in the  left main, left anterior descending, left circumflex and right coronary arteries. Severe thickening calcification of the aortic valve. Mediastinum/Lymph Nodes: Several prominent but non pathologically enlarged mediastinal and hilar lymph nodes are noted measuring up to 8 mm (nonspecific). Esophagus is normal in appearance. No axillary lymphadenopathy. Lungs/Pleura: Again noted is a macrolobulated nodule in the periphery of the right upper lobe which currently measures 2.3 x 1.4 x 1.6 cm (axial image 41 of series 15 and coronal image 59 of series 16), which demonstrates some mild peripheral spiculation, most compatible with primary bronchogenic neoplasm. Nodular area of scarring in the posterior aspect of the left lower lobe again noted. No acute consolidative airspace disease. No pleural effusions. Mild diffuse bronchial wall thickening with mild centrilobular and paraseptal emphysema. Musculoskeletal/Soft Tissues: There are no aggressive appearing lytic or blastic lesions noted in the visualized portions of the skeleton. CTA ABDOMEN AND PELVIS FINDINGS Hepatobiliary: Liver has a slightly shrunken appearance and nodular contour, suggesting early changes of cirrhosis. No discrete cystic or solid hepatic lesions. No intra or extrahepatic biliary ductal dilatation. Tiny calcified gallstones lie dependently in the gallbladder. No findings to suggest an acute cholecystitis at this time. Pancreas: No pancreatic mass. No pancreatic ductal dilatation. No pancreatic or peripancreatic fluid or inflammatory changes. Spleen: Small 9 mm hypervascular lesion in the spleen, nonspecific, but likely a flash fill hemangioma. Adrenals/Urinary Tract: Bilateral kidneys and bilateral adrenal glands are normal in appearance. No hydroureteronephrosis. Urinary bladder is normal in appearance. Stomach/Bowel: Normal appearance of the stomach. No pathologic dilatation of small bowel or colon. Numerous colonic diverticulae are noted, without  surrounding inflammatory changes to suggest an acute diverticulitis at this time. Normal appendix. Vascular/Lymphatic: Aortic atherosclerosis with vascular findings and measurements pertinent to potential TAVR procedure, as detailed below. Status post aorto bi-iliac bypass graft with widely patent limbs of the stent graft bilaterally. Notably, the stent graft is twisted shortly after the bifurcation such that the left limb of the graft extends to the right pelvic arteries and the right limb of the graft extends to the left. Celiac axis, superior mesenteric artery and inferior mesenteric artery are patent without definite hemodynamically significant stenosis. Single renal arteries bilaterally are widely patent. No lymphadenopathy noted in the abdomen or pelvis. Reproductive: Prostate gland and seminal vesicles are unremarkable in appearance. Other: No significant volume of ascites.  No pneumoperitoneum. Musculoskeletal: Interbody cages at the L4-L5 interspace. There are no aggressive appearing lytic or blastic lesions noted in the visualized portions of the skeleton. VASCULAR MEASUREMENTS PERTINENT TO TAVR: AORTA: Minimal Aortic Diameter -  2.0 x 1.8 mm Severity of Aortic Calcification -  moderate RIGHT PELVIS: Right Common Iliac Artery - Minimal Diameter - 9.6 x 7.0 mm Tortuosity - mild Calcification - moderate Comment - Aorto bi-iliac stent graft which is widely patent. Right  External Iliac Artery - Minimal Diameter - 5.7 x 5.7 mm Tortuosity - moderate Calcification - moderate Right Common Femoral Artery - Minimal Diameter - 5.2 x 5.9 mm Tortuosity - mild Calcification - moderate LEFT PELVIS: Left Common Iliac Artery - Minimal Diameter - 9.4 x 7.8 mm Tortuosity - mild Calcification - moderate Comment - Aorto bi-iliac stent graft which is widely patent. Left External Iliac Artery - Minimal Diameter - 5.3 x 6.0 mm Tortuosity - mild Calcification - mild Left Common Femoral Artery - Minimal Diameter - 5.1 x 6.3 mm  Tortuosity - mild Calcification - moderate Review of the MIP images confirms the above findings. IMPRESSION: 1. Vascular findings and measurements pertinent to potential TAVR procedure, as detailed above. This patient may have suitable pelvic arterial access for low profile devices. 2. Severe thickening calcification of the aortic valve, compatible with the reported clinical history of severe aortic stenosis. 3. Cardiomegaly with biatrial dilatation. Notably, there is a filling defect in the tip of the left atrial appendage concerning for left atrial appendage thrombus. This places the patient at risk for systemic embolization, and could be confirmed with transesophageal echocardiography if clinically appropriate. 4. Aortic atherosclerosis, in addition to left main and 3 vessel coronary artery disease. Patient is also status post aortoiliac bypass graft which appears widely patent bilaterally. Please note the proximal twist of the limbs of the graft, as discussed above. 5. 2.3 x 1.4 x 1.6 cm macrolobulated slightly spiculated right upper lobe pulmonary nodule remains highly concerning for primary bronchogenic neoplasm. There multiple prominent but nonenlarged mediastinal and hilar lymph nodes which are nonspecific. No other signs of potential metastatic disease noted elsewhere in the chest, abdomen or pelvis. 6. Mild diffuse bronchial wall thickening with mild centrilobular and paraseptal emphysema; imaging findings suggestive of underlying COPD. 7. Cholelithiasis without evidence of acute cholecystitis at this time. 8. 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 9. Additional incidental findings, as above. These results will be called to the ordering clinician or representative by the Radiologist Assistant, and communication documented in the PACS or zVision Dashboard. Aortic Atherosclerosis (ICD10-I70.0) and Emphysema (ICD10-J43.9). Electronically Signed   By: Vinnie Langton M.D.   On:  01/29/2017 13:58   Dg Hip Unilat With Pelvis 2-3 Views Right  Result Date: 02/23/2017 CLINICAL DATA:  Right hip pain for 1 day. EXAM: DG HIP (WITH OR WITHOUT PELVIS) 2-3V RIGHT COMPARISON:  Abdominal CT 01/29/2017 FINDINGS: The cortical margins of the bony pelvis and right hip are intact. No fracture. Pubic symphysis and sacroiliac joints are congruent. Both femoral heads are well-seated in the respective acetabula. No radiographic evidence of focal lesion. Vascular calcification in bi-iliac graft in place. A dressing overlies the lateral hip. IMPRESSION: No acute abnormality or explanation for right hip pain. Electronically Signed   By: Jeb Levering M.D.   On: 02/23/2017 03:27   Ct Angio Abd/pel W/ And/or W/o  Result Date: 01/29/2017 CLINICAL DATA:  76 year old male with history of severe aortic stenosis. Preprocedural study prior to potential transcatheter aortic valve replacement (TAVR) procedure. EXAM: CT ANGIOGRAPHY CHEST, ABDOMEN AND PELVIS TECHNIQUE: Multidetector CT imaging through the chest, abdomen and pelvis was performed using the standard protocol during bolus administration of intravenous contrast. Multiplanar reconstructed images and MIPs were obtained and reviewed to evaluate the vascular anatomy. CONTRAST:  80 mL of Isovue 370. COMPARISON:  PET-CT 12/02/2016. CT the chest 11/01/2016. CT the abdomen and pelvis 08/05/2015. FINDINGS: CTA CHEST FINDINGS Cardiovascular: Heart size is moderately enlarged with biatrial  dilatation. Filling defect in the left atrial appendage (axial image 55 of series 14), concerning for potential left atrial appendage thrombus. There is no significant pericardial fluid, thickening or pericardial calcification. There is aortic atherosclerosis, as well as atherosclerosis of the great vessels of the mediastinum and the coronary arteries, including calcified atherosclerotic plaque in the left main, left anterior descending, left circumflex and right coronary arteries.  Severe thickening calcification of the aortic valve. Mediastinum/Lymph Nodes: Several prominent but non pathologically enlarged mediastinal and hilar lymph nodes are noted measuring up to 8 mm (nonspecific). Esophagus is normal in appearance. No axillary lymphadenopathy. Lungs/Pleura: Again noted is a macrolobulated nodule in the periphery of the right upper lobe which currently measures 2.3 x 1.4 x 1.6 cm (axial image 41 of series 15 and coronal image 59 of series 16), which demonstrates some mild peripheral spiculation, most compatible with primary bronchogenic neoplasm. Nodular area of scarring in the posterior aspect of the left lower lobe again noted. No acute consolidative airspace disease. No pleural effusions. Mild diffuse bronchial wall thickening with mild centrilobular and paraseptal emphysema. Musculoskeletal/Soft Tissues: There are no aggressive appearing lytic or blastic lesions noted in the visualized portions of the skeleton. CTA ABDOMEN AND PELVIS FINDINGS Hepatobiliary: Liver has a slightly shrunken appearance and nodular contour, suggesting early changes of cirrhosis. No discrete cystic or solid hepatic lesions. No intra or extrahepatic biliary ductal dilatation. Tiny calcified gallstones lie dependently in the gallbladder. No findings to suggest an acute cholecystitis at this time. Pancreas: No pancreatic mass. No pancreatic ductal dilatation. No pancreatic or peripancreatic fluid or inflammatory changes. Spleen: Small 9 mm hypervascular lesion in the spleen, nonspecific, but likely a flash fill hemangioma. Adrenals/Urinary Tract: Bilateral kidneys and bilateral adrenal glands are normal in appearance. No hydroureteronephrosis. Urinary bladder is normal in appearance. Stomach/Bowel: Normal appearance of the stomach. No pathologic dilatation of small bowel or colon. Numerous colonic diverticulae are noted, without surrounding inflammatory changes to suggest an acute diverticulitis at this time.  Normal appendix. Vascular/Lymphatic: Aortic atherosclerosis with vascular findings and measurements pertinent to potential TAVR procedure, as detailed below. Status post aorto bi-iliac bypass graft with widely patent limbs of the stent graft bilaterally. Notably, the stent graft is twisted shortly after the bifurcation such that the left limb of the graft extends to the right pelvic arteries and the right limb of the graft extends to the left. Celiac axis, superior mesenteric artery and inferior mesenteric artery are patent without definite hemodynamically significant stenosis. Single renal arteries bilaterally are widely patent. No lymphadenopathy noted in the abdomen or pelvis. Reproductive: Prostate gland and seminal vesicles are unremarkable in appearance. Other: No significant volume of ascites.  No pneumoperitoneum. Musculoskeletal: Interbody cages at the L4-L5 interspace. There are no aggressive appearing lytic or blastic lesions noted in the visualized portions of the skeleton. VASCULAR MEASUREMENTS PERTINENT TO TAVR: AORTA: Minimal Aortic Diameter -  2.0 x 1.8 mm Severity of Aortic Calcification -  moderate RIGHT PELVIS: Right Common Iliac Artery - Minimal Diameter - 9.6 x 7.0 mm Tortuosity - mild Calcification - moderate Comment - Aorto bi-iliac stent graft which is widely patent. Right External Iliac Artery - Minimal Diameter - 5.7 x 5.7 mm Tortuosity - moderate Calcification - moderate Right Common Femoral Artery - Minimal Diameter - 5.2 x 5.9 mm Tortuosity - mild Calcification - moderate LEFT PELVIS: Left Common Iliac Artery - Minimal Diameter - 9.4 x 7.8 mm Tortuosity - mild Calcification - moderate Comment - Aorto bi-iliac stent graft which is  widely patent. Left External Iliac Artery - Minimal Diameter - 5.3 x 6.0 mm Tortuosity - mild Calcification - mild Left Common Femoral Artery - Minimal Diameter - 5.1 x 6.3 mm Tortuosity - mild Calcification - moderate Review of the MIP images confirms the above  findings. IMPRESSION: 1. Vascular findings and measurements pertinent to potential TAVR procedure, as detailed above. This patient may have suitable pelvic arterial access for low profile devices. 2. Severe thickening calcification of the aortic valve, compatible with the reported clinical history of severe aortic stenosis. 3. Cardiomegaly with biatrial dilatation. Notably, there is a filling defect in the tip of the left atrial appendage concerning for left atrial appendage thrombus. This places the patient at risk for systemic embolization, and could be confirmed with transesophageal echocardiography if clinically appropriate. 4. Aortic atherosclerosis, in addition to left main and 3 vessel coronary artery disease. Patient is also status post aortoiliac bypass graft which appears widely patent bilaterally. Please note the proximal twist of the limbs of the graft, as discussed above. 5. 2.3 x 1.4 x 1.6 cm macrolobulated slightly spiculated right upper lobe pulmonary nodule remains highly concerning for primary bronchogenic neoplasm. There multiple prominent but nonenlarged mediastinal and hilar lymph nodes which are nonspecific. No other signs of potential metastatic disease noted elsewhere in the chest, abdomen or pelvis. 6. Mild diffuse bronchial wall thickening with mild centrilobular and paraseptal emphysema; imaging findings suggestive of underlying COPD. 7. Cholelithiasis without evidence of acute cholecystitis at this time. 8. 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 9. Additional incidental findings, as above. These results will be called to the ordering clinician or representative by the Radiologist Assistant, and communication documented in the PACS or zVision Dashboard. Aortic Atherosclerosis (ICD10-I70.0) and Emphysema (ICD10-J43.9). Electronically Signed   By: Vinnie Langton M.D.   On: 01/29/2017 13:58     Diagnostic Studies/Procedures    TAVR OPERATIVE NOTE Date of  Procedure:                02/24/2017  Preoperative Diagnosis:      Severe Aortic Stenosis   Postoperative Diagnosis:    Same   Procedure:        Transcatheter Aortic Valve Replacement - Percutaneous Transfemoral Approach             Edwards Sapien 3 THV (size 26 mm, model # 9600TFX, serial # [id number])            Co-Surgeons:                        Gaye Pollack, MD and Sherren Mocha, MD  Anesthesiologist:                  Adele Barthel, MD  Echocardiographer:              Ena Dawley, MD  Pre-operative Echo Findings: ? Severe aortic stenosis ? Normal left ventricular systolic function  Post-operative Echo Findings: ? Trace paravalvular leak ? Normal left ventricular systolic function  _____________   2D ECHO: 02/25/2017 Study Conclusions - Left ventricle: The cavity size was normal. Wall thickness was increased in a pattern of mild LVH. Systolic function was normal. The estimated ejection fraction was in the range of 50% to 55%. Wall motion was normal; there were no regional wall motion abnormalities. - Aortic valve: A bioprosthesis was present. There was trivial regurgitation. Valve area (VTI): 1.56 cm^2. Valve area (Vmax): 1.44 cm^2. Valve area (Vmean): 1.7 cm^2. -  Mitral valve: Calcified annulus. There was mild regurgitation. - Left atrium: The atrium was severely dilated. - Right atrium: The atrium was moderately dilated. - Pulmonary arteries: Systolic pressure was mildly increased. Impressions: - Normal LV systolic function; mild LVH; s/p TAVR with mean gradient 9 mmHg and trivial AI; mild MR; biatrial enlargment; mild TR with mildly elevated pulmonary pressure.   Disposition   Pt is being discharged home today in good condition.  Follow-up Plans & Appointments    Follow-up Information    Eileen Stanford, PA-C. Go on 03/04/2017.   Specialties:  Cardiology, Radiology Why:  @ 2pm, please arrive at least 10 minutes early    Contact information: [place] [address] [phone]          Discharge Instructions    Diet - low sodium heart healthy    Complete by:  As directed    Discharge instructions    Complete by:  As directed    ACTIVITY AND EXERCISE . Daily activity and exercise are an important part of your recovery. People recover at different rates depending on their general health and type of valve procedure. . Most people require six to 10 weeks to feel recovered. . No lifting, pushing, pulling more than 10 pounds (examples to avoid: groceries, vacuuming, gardening, golfing): - For one week with a procedure through the groin. - For six weeks for procedures through the chest wall. - For three months for procedures through the breast-bone. . After the initial healing process of the access site, we recommend cardiac rehabilitation for all TAVR patients. Cardiac rehabilitation will help you: - Rebuild stamina, strength and balance. - Learn how to participate in activities safely, as well as help you regain confidence to do so. - Return to activities of daily living and leisure. . Discuss attending cardiac rehabilitation at your follow-up appointment   DRIVING . Do not drive for four weeks after the date of your procedure. . If you have been told by your doctor in the past that you may not drive, you must talk with him/her before you begin driving again. . When you resume driving, you must have someone with you.  HYGIENE If you had a femoral (leg) procedure, you may take a shower when you return home. After the shower, pat the site dry. Do NOT use powder, oils or lotions in your groin area until the site has completely healed. . If you had a chest procedure, you may shower when you return home unless specifically instructed not to by your discharging practitioner. - DO NOT scrub incision; pat dry with a towel - DO NOT apply any lotions, oils,  powders to the incision - No tub baths / swimming for at least six weeks.  SITE CARE . You likely will have small openings in both groins from catheters used during the procedure. If you had a transfemoral procedure, one groin will have a larger opening and may be bruised or tender. . If you had a chest procedure, you will have either a small incision in your upper sternum (breast-bone) or between your ribs on your left side. - Chest wall site: The surgical incision should be kept dry (no lotions / oils / powders) and open to air. If you experience irritation from clothing rubbing on the incision, a light gauze dressing may be applied. - Inspect your incision daily; notify your physician if there is increased redness, swelling or drainage from the incision. - If the incision  is located on your breast-bone you must avoid lifting objects heavier than a gallon of milk (eight pounds) and stretching / twisting / pulling with your arms for at least three months to ensure strong bone healing.   CONTACT [phone]- . Check your sites daily. Contact our office if you have any of the following problems: - Redness and warmth that does not go away - Yellow or green drainage from the wound - Fever and chills - Increasing numbness in your legs - Worsening pain at the site . If you had a leg/groin procedure, it is normal to have bruising or a soft lump at the site. It is not normal if the lump suddenly becomes larger or more firm. This may mean you are bleeding. If this happens: - Lie down - Have someone press down hard, just above the hole in your skin where the procedure was performed for 15 minutes. If after holding on the site, the lump does not become larger or harder, they are performing this correctly. - If the bleeding has stopped after 15 minutes, rest and stay laying down for at least two hours. - If the bleeding continues, call 911 for an ambulance. Do NOT drive yourself  or have someone else drive you.   Increase activity slowly    Complete by:  As directed       Discharge Medications     Medication List    STOP taking these medications   ibuprofen 200 MG tablet Commonly known as:  ADVIL,MOTRIN     TAKE these medications   alfuzosin 10 MG 24 hr tablet Commonly known as:  UROXATRAL Take 10 mg by mouth at bedtime.   amLODipine 5 MG tablet Commonly known as:  NORVASC Take 1 tablet (5 mg total) by mouth daily.   aspirin 81 MG chewable tablet Chew 1 tablet (81 mg total) by mouth daily.   atorvastatin 40 MG tablet Commonly known as:  LIPITOR Take 1 tablet (40 mg total) by mouth daily.   cholecalciferol 1000 units tablet Commonly known as:  VITAMIN D Take 1 tablet (1,000 Units total) by mouth daily.   clopidogrel 75 MG tablet Commonly known as:  PLAVIX Take 1 tablet (75 mg total) by mouth daily with breakfast.   ferrous gluconate 324 MG tablet Commonly known as:  FERGON Take 1 tablet (324 mg total) by mouth 2 (two) times daily with a meal.   Fish Oil 1200 MG Caps Take 2,400 mg by mouth daily.   levothyroxine 75 MCG tablet Commonly known as:  SYNTHROID, LEVOTHROID Take 1 tablet (75 mcg total) by mouth daily.   lisinopril 40 MG tablet Commonly known as:  PRINIVIL,ZESTRIL Take 1 tablet (40 mg total) by mouth daily.   metoprolol tartrate 25 MG tablet Commonly known as:  LOPRESSOR Take 0.5 tablets (12.5 mg total) by mouth 2 (two) times daily.   ONE-A-DAY MENS PO Take 1 tablet by mouth daily.   pantoprazole 40 MG tablet Commonly known as:  PROTONIX Take 1 tablet (40 mg total) by mouth 2 (two) times daily.         Outstanding Labs/Studies   CBC  Duration of Discharge Encounter   Greater than 30 minutes including physician time.  Signed, Angelena Form PA-C 02/26/2017, 10:04 AM

## 2017-02-26 NOTE — Care Management Note (Signed)
Case Management Note Marvetta Gibbons RN, BSN Unit 4E-Case Manager-- [place] coverage [phone]  Patient Details  Name: [name] [name] MRN: [id number] Date of Birth: 09/18/1940  Subjective/Objective:  Pt admitted with Worsening dyspnea on exertion, low hemoglobin now s/p TAVR on 02/24/17                  Action/Plan: PTA pt lived at home with wife- anticipate return home- CM will follow for d/c needs  Expected Discharge Date:  02/26/17               Expected Discharge Plan:  Home/Self Care  In-House Referral:  NA  Discharge planning Services  CM Consult  Post Acute Care Choice:  NA Choice offered to:  NA  DME Arranged:    DME Agency:     HH Arranged:    [place] Agency:     Status of Service:  Completed, signed off  If discussed at [employer] of Stay Meetings, dates discussed:    Discharge Disposition: home/self care   Additional Comments:  02/26/17- 1045- Darnelle Derrick RN, CM- pt for d/c home today- no CM needs noted for discharge.   Dawayne Patricia, RN 02/26/2017, 10:47 AM

## 2017-02-26 NOTE — Progress Notes (Signed)
CARDIAC REHAB PHASE I   Pt states he ambulated 3 times yesterday, c/o some hip pain this morning, declines additional ambulation at this time, awaiting discharge. Cardiac surgery discharge education completed with pt and wife at bedside. Reviewed risk factors, IS, restrictions, activity progression, exercise, heart healthy diet, daily weights and phase 2 cardiac rehab. Pt and wife verbalized understanding. Pt agrees to phase 2 cardiac rehab referral, will send to [place] per pt request. Pt in bed, call bell within reach.   [phone] Lenna Sciara, RN, BSN 02/26/2017 10:14 AM

## 2017-02-26 NOTE — Progress Notes (Signed)
[name] to be D/C'd Home per MD order. Discussed with the patient and all questions fully answered.    VVS, Skin clean, dry and intact without evidence of skin break down, no evidence of skin tears noted.  IV catheter discontinued intact. Site without signs and symptoms of complications. Dressing and pressure applied.  An After Visit Summary was printed and given to the patient.  Patient escorted via [place], and D/C home via private auto.  Cyndra Numbers  02/26/2017 10:57 AM

## 2017-02-26 NOTE — Discharge Instructions (Signed)

## 2017-02-26 NOTE — Progress Notes (Signed)
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   MD and recommends course of Pylera and BID PPI for total of 10 days as outpt.  After antibiotics completed, or after 1 month of BID PPI, can switch to 1x daily PPI.  Better begin course of treatment after discharge and well into post TAVR recovery.    I discussed this with pt who was in process of being discharged.  Called RX for Pylera to his pharmacy in [place].  The Pylera is not on his discharge med list as wife had already departed to get the car with finalized med list, but pt understands the additional medication.     Azucena Freed PA-C.   [phone].

## 2017-02-27 ENCOUNTER — Telehealth: Payer: Self-pay | Admitting: Physician Assistant

## 2017-02-27 LAB — TYPE AND SCREEN
ABO/RH(D): A POS
ANTIBODY SCREEN: NEGATIVE
UNIT DIVISION: 0
UNIT DIVISION: 0

## 2017-02-27 LAB — BPAM RBC
BLOOD PRODUCT EXPIRATION DATE: 201809252359
Blood Product Expiration Date: 201809252359
Unit Type and Rh: 6200
Unit Type and Rh: 6200

## 2017-02-27 NOTE — Consult Note (Signed)
           [facility] CM Primary Care Navigator  02/27/2017  [name] [name] September 08, 1940 [id number]   Went to seepatient at the bedsideto identify possible discharge needs but he was already discharged per staff report.  Patient was discharged home yesterday.  Primary care provider's officeis listed as doing transition of care (TOC).   For questions, please contact:  Dannielle Huh, BSN, RN- [facility] Primary Care Navigator  Telephone: [phone] [place]

## 2017-02-27 NOTE — Telephone Encounter (Signed)
    Patient contacted regarding discharge from hospital on 02/26/17. Patient understands to follow up with provider Angelena Form on 9/12 at 2pm at [address].  Patient understands discharge instructions? Yes. Patient understands medications and regimen? Yes. Patient understands to bring all medications to this visit? Yes.   Angelena Form PA-C  [facility]

## 2017-03-02 ENCOUNTER — Other Ambulatory Visit: Payer: Self-pay

## 2017-03-02 DIAGNOSIS — Z952 Presence of prosthetic heart valve: Secondary | ICD-10-CM

## 2017-03-02 DIAGNOSIS — I350 Nonrheumatic aortic (valve) stenosis: Secondary | ICD-10-CM

## 2017-03-02 NOTE — Progress Notes (Addendum)
Cardiology Office Note    Date:  03/04/2017   ID:  [name] [name], DOB [birth year], MRN [id number]  PCP:  [name], FNP  Cardiologist:  Dr. Bronson Ing / Dr. Burt Knack (TAVR)  CC: post hospital follow up  History of Present Illness:  [name] [name] is a 76 y.o. male with a history of HTN, HLD, previous heavy tobacco abuse, COPD, PAD, AAA s/p stent graft repair (2011), bladder cancer s/p TURBT, RUL hypermetabolic pulmonary nodule concerning for bronchogenic neoplasm, recently diagnosed persistent atrial fibrillation and severe AS s/p TAVR (02/24/17) who presents to clinic for post hospital follow up.   His most recent echo on 10/28/2016 showed severe AS witha mean gradient of 55 mm Hg with normal LV function.   [name] [name] saw Dr. Roxan Hockey for evaluation of a 17 mm spiculated nodule in the RUL that was hypermetabolic on PET scan. This was found on a lung cancer screening CT on 11/10/2016. There was no pathologic adenopathy. It was felt to be most likely a lung cancer that would eventually require surgical removal. It was felt he would not tolerate surgery with severe AS and he was referred for TAVR. Plan was to consider thoracic surgery for hypermetabolic lung nodule once he recovered from TAVR.   Cath on 01/14/2017 showed 60% proximal LAD stenosis with mild non-obstructive disease in the LCX and RCA. Dr. Burt Knack felt that his LAD stenosis could be managed medically. He was seen by Dr. Cyndia Bent for surgical consultation and felt he would be at least moderate risk for SAVR and recommended TAVR. During that office visit he was noted to be in atrial fibrillation and started on Eliquis 58m BID. He tolerated this well but earlier this week it caused nausea and decreased appetite. He was told to stop Eliquis and hislast dose was 8/28. His TAVR date was set up for 02/24/17.  [facility] in anticipation of TAVR the following week.  ECG showed afib with new ST/TW changes in the anterolateral leads and pre operative labs showed acute anemia with Hg down to 5.9. He was admitted for acute anemia and acute diastolic CHF. He was transfused and blood counts stabalized. He was also treated with IV lasix. EGD on 02/21/17 showed non bleeding gastric ulcers. It was felt that with previous use of Eliquis, that this was likely the source of melena and anemia. He was started on iron and a PPI for treatment of ulcer disease.   He underwent successful TAVR with a 218mEdwards Sapien valve via the TF approach under MAC on 02/24/17. Postop Echo 02/25/17 showed no residual AS and trivial PVL. He was started on Aspirin 81 mg and Plavix 75 mg daily.  Today he presents to clinic for follow up. He has not been taking Aspirin for unclear reasons. He feels like a "new man". He is breathing so much better. He has been walking around the house with no issues at all. No chest pain. No LE edema, orthopnea or PND. No dizziness or syncope. No palpitations.    Past Medical History:  Diagnosis Date  . AAA (abdominal aortic aneurysm) ([place]  . Aortic stenosis, severe    a. 02/2017: s/p TAVR with Edwards Sapien 3 THV (size 26 mm, model # 9600TFX, serial # [id number] . Arthritis   . Atherosclerotic peripheral vascular disease ([place]  . Atrial fibrillation, persistent ([place]   a. not a candidate for [place]iven hx of  GI bleed  . Bilateral carotid artery stenosis   . COPD (chronic obstructive pulmonary disease) ([place])   . Hyperlipidemia   . Hypertension   . Hypothyroidism   . Skin cancer   . Vitamin D deficiency     Past Surgical History:  Procedure Laterality Date  . ABDOMINAL AORTIC ANEURYSM REPAIR  08-09-09  . BACK SURGERY    . ESOPHAGOGASTRODUODENOSCOPY N/A 02/21/2017   Procedure: ESOPHAGOGASTRODUODENOSCOPY (EGD);  Surgeon: Jerene Bears, MD;  [facility] END[facility]COPY;  Service: Gastroenterology;  Laterality: N/A;  . HYDROCELE EXCISION / REPAIR    . SKIN CANCER  EXCISION    . SPINE SURGERY    . TEE WITHOUT CARDIOVERSION N/A 02/24/2017   Procedure: TRANSESOPHAGEAL ECHOCARDIOGRAM (TEE);  Surgeon: Sherren Mocha, MD;  Location: [place];  Service: Open Heart Surgery;  Laterality: N/A;  . TRANSCATHETER AORTIC VALVE REPLACEMENT, TRANSFEMORAL N/A 02/24/2017   Procedure: TRANSCATHETER AORTIC VALVE REPLACEMENT, TRANSFEMORAL using a 52m Edwards Sapien 3 Aortic Valve;  Surgeon: CSherren Mocha MD;  [place]  Service: Open Heart Surgery;  Laterality: N/A;  . TRANSURETHRAL RESECTION OF BLADDER TUMOR N/A 06/06/2016   Procedure: TRANSURETHRAL RESECTION OF BLADDER TUMOR (TURBT);  Surgeon: SFranchot Gallo MD;  [facility];  Service: Urology;  Laterality: N/A;  . TRANSURETHRAL RESECTION OF BLADDER TUMOR N/A 07/22/2016   Procedure: TRANSURETHRAL RESECTION OF BLADDER TUMOR (TURBT);  Surgeon: SFranchot Gallo MD;  [facility];  Service: Urology;  Laterality: N/A;    Current Medications: Outpatient Medications Prior to Visit  Medication Sig Dispense Refill  . alfuzosin (UROXATRAL) 10 MG 24 hr tablet Take 10 mg by mouth at bedtime.     .Marland KitchenamLODipine (NORVASC) 5 MG tablet Take 1 tablet (5 mg total) by mouth daily. 30 tablet 5  . atorvastatin (LIPITOR) 40 MG tablet Take 1 tablet (40 mg total) by mouth daily. 30 tablet 5  . cholecalciferol (VITAMIN D) 1000 UNITS tablet Take 1 tablet (1,000 Units total) by mouth daily. 30 tablet 6  . clopidogrel (PLAVIX) 75 MG tablet Take 1 tablet (75 mg total) by mouth daily with breakfast. 30 tablet 6  . ferrous gluconate (FERGON) 324 MG tablet Take 1 tablet (324 mg total) by mouth 2 (two) times daily with a meal. 60 tablet 6  . levothyroxine (SYNTHROID, LEVOTHROID) 75 MCG tablet Take 1 tablet (75 mcg total) by mouth daily. 30 tablet 8  . lisinopril (PRINIVIL,ZESTRIL) 40 MG tablet Take 1 tablet (40 mg total) by mouth daily. 30 tablet 5  . metoprolol tartrate (LOPRESSOR) 25 MG tablet Take 0.5 tablets (12.5 mg total) by mouth  2 (two) times daily. 30 tablet 6  . Multiple Vitamin (ONE-A-DAY MENS PO) Take 1 tablet by mouth daily.      . Omega-3 Fatty Acids (FISH OIL) 1200 MG CAPS Take 2,400 mg by mouth daily.     . pantoprazole (PROTONIX) 40 MG tablet Take 1 tablet (40 mg total) by mouth 2 (two) times daily. 60 tablet 6  . aspirin 81 MG chewable tablet Chew 1 tablet (81 mg total) by mouth daily.     No facility-administered medications prior to visit.      Allergies:   Patient has no known allergies.   Social History   Social History  . Marital status: Married    Spouse name: N/A  . Number of children: N/A  . Years of education: N/A   Occupational History  . Retired     SLibrarian, academicwith tRisk analyst  Social History  Main Topics  . Smoking status: Former Smoker    Packs/day: 1.00    Years: 51.00    Types: Cigarettes    Start date: 11/25/1954    Quit date: 10/27/2005  . Smokeless tobacco: Never Used  . Alcohol use No     Comment: none since 1999  . Drug use: No  . Sexual activity: Yes   Other Topics Concern  . None   Social History Narrative  . None     Family History:  The patient's family history includes Alcohol abuse in his father; COPD in his mother and sister; Cancer in his mother; Gout in his brother and brother; Heart attack in his father and son; Heart disease in his father; Hypertension in his brother; Lupus in his sister; Prostatitis in his brother.      ROS:   Please see the history of present illness.    ROS All other systems reviewed and are negative.   PHYSICAL EXAM:   VS:  BP 122/70   Pulse 87   Ht 5' 10"  (1.778 m)   Wt 166 lb 12.8 oz (75.7 kg)   SpO2 98%   BMI 23.93 kg/m    GEN: Well nourished, well developed, in no acute distress  HEENT: normal  Neck: no JVD, carotid bruits, or masses Cardiac: irreg irreg; no murmurs, rubs, or gallops,no edema  Respiratory:  clear to auscultation bilaterally, normal work of breathing GI: soft, nontender, nondistended, + BS MS:  no deformity or atrophy  Skin: warm and dry, no rash, groin site stable Neuro:  Alert and Oriented x 3, Strength and sensation are intact Psych: euthymic mood, full affect   Wt Readings from Last 3 Encounters:  03/04/17 166 lb 12.8 oz (75.7 kg)  02/26/17 176 lb 5.9 oz (80 kg)  02/20/17 174 lb 9.6 oz (79.2 kg)      Studies/Labs Reviewed:   EKG:  EKG is ordered today.  The ekg ordered today demonstrates atrial fibrillation HR 87, non specific ST/TW abnormalities   Recent Labs: 10/20/2016: TSH 4.490 02/20/2017: ALT 50 02/25/2017: Magnesium 1.8 02/26/2017: BUN 16; Creatinine, Ser 1.08; Hemoglobin 8.2; Platelets 154; Potassium 3.9; Sodium 132   Lipid Panel    Component Value Date/Time   CHOL 84 (L) 10/20/2016 1022   CHOL 118 01/14/2013 1157   TRIG 78 10/20/2016 1022   TRIG 160 (H) 04/13/2014 0846   TRIG 178 (H) 01/14/2013 1157   HDL 27 (L) 10/20/2016 1022   HDL 32 (L) 04/13/2014 0846   HDL 30 (L) 01/14/2013 1157   CHOLHDL 3.1 10/20/2016 1022   LDLCALC 41 10/20/2016 1022   LDLCALC 61 04/13/2014 0846   LDLCALC 52 01/14/2013 1157    Additional studies/ records that were reviewed today include:  Date of Procedure:02/24/2017  Preoperative Diagnosis:Severe Aortic Stenosis   Postoperative Diagnosis:Same   Procedure:   Transcatheter Aortic Valve Replacement - Percutaneous Transfemoral Approach Edwards Sapien 3 THV (size 14m, model # 9600TFX, serial # [id number]  Co-Surgeons:Bryan KAlveria Apley MD and MSherren Mocha MD  Anesthesiologist:Ryan ERoanna Banning MD  EDala Dock MD  Pre-operative Echo Findings: ? Severe aortic stenosis ? Normalleft ventricular systolic function  Post-operative Echo Findings: ? Traceparavalvular leak ? Normalleft ventricular systolic function  _____________   2D ECHO: 02/25/2017 Study Conclusions - Left ventricle:  The cavity size was normal. Wall thickness was increased in a pattern of mild LVH. Systolic function was normal. The estimated ejection fraction was in the range of 50% to 55%. Wall motion was normal; there  were no regional wall motion abnormalities. - Aortic valve: A bioprosthesis was present. There was trivial regurgitation. Valve area (VTI): 1.56 cm^2. Valve area (Vmax): 1.44 cm^2. Valve area (Vmean): 1.7 cm^2. - Mitral valve: Calcified annulus. There was mild regurgitation. - Left atrium: The atrium was severely dilated. - Right atrium: The atrium was moderately dilated. - Pulmonary arteries: Systolic pressure was mildly increased. Impressions: - Normal LV systolic function; mild LVH; s/p TAVR with mean gradient 9 mmHg and trivial AI; mild MR; biatrial enlargment; mild TR with mildly elevated pulmonary pressure.    ASSESSMENT & PLAN:   Severe AS s/p TAVR: doing well. Not taking aspirin for unclear reasons but on plavix. He will resume a baby aspirin today. Plan for echo 1 month s/p TAVR and visit with me 03/25/17. ECG with afib with CVR, HR 87. Groin sites stable.   Anemia: no recurrent black stools. Will check a CBC today to make sure blood counts are stable.   Chronic diastolic CHF: this has improved with treatment of her AS. He appears euvolemic on no diuretic therapy.   Persistent atrial fibrillation: rate well controlled on Lopressor 12.86m BID. CHADSVASC of at least 5 (CHF, HTN, age, vasc dz), but [facility] given recent GI bleed on Eliquis.   HTN: BP under excellent control.  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  Concerns regarding medicines are outlined above.  Medication changes, Labs and Tests ordered today are listed in the  Patient Instructions below. Patient Instructions  Medication Instructions:  1) START ASPIRIN 81 mg daily  Labwork: TODAY: CBC  Testing/Procedures: None  Follow-Up: Please keep your follow-up appointments as scheduled!  Any Other Special Instructions Will Be Listed Below (If Applicable).     If you need a refill on your cardiac medications before your next appointment, please call your pharmacy.      Signed, KAngelena Form PA-C  03/04/2017 2:56 PM    [facility] [address] [place]  [id number]: [phone] Fax: [phone]

## 2017-03-03 ENCOUNTER — Telehealth: Payer: Self-pay | Admitting: Cardiovascular Disease

## 2017-03-03 NOTE — Telephone Encounter (Signed)
New message       Calling to confirm surgery scheduled for 02-24-17 was cancelled and also need to know if it has been rescheduled.  [employer] needs to know something today in order for the pre cert to still be approved

## 2017-03-03 NOTE — Telephone Encounter (Signed)
I cannot confirm this. Sorry

## 2017-03-04 ENCOUNTER — Ambulatory Visit (INDEPENDENT_AMBULATORY_CARE_PROVIDER_SITE_OTHER): Payer: Medicare Other | Admitting: Physician Assistant

## 2017-03-04 ENCOUNTER — Encounter: Payer: Self-pay | Admitting: Physician Assistant

## 2017-03-04 VITALS — BP 122/70 | HR 87 | Ht 70.0 in | Wt 166.8 lb

## 2017-03-04 DIAGNOSIS — I2510 Atherosclerotic heart disease of native coronary artery without angina pectoris: Secondary | ICD-10-CM

## 2017-03-04 DIAGNOSIS — I481 Persistent atrial fibrillation: Secondary | ICD-10-CM

## 2017-03-04 DIAGNOSIS — Z952 Presence of prosthetic heart valve: Secondary | ICD-10-CM | POA: Diagnosis not present

## 2017-03-04 DIAGNOSIS — I10 Essential (primary) hypertension: Secondary | ICD-10-CM

## 2017-03-04 DIAGNOSIS — D6489 Other specified anemias: Secondary | ICD-10-CM

## 2017-03-04 DIAGNOSIS — I5032 Chronic diastolic (congestive) heart failure: Secondary | ICD-10-CM | POA: Diagnosis not present

## 2017-03-04 DIAGNOSIS — I4819 Other persistent atrial fibrillation: Secondary | ICD-10-CM

## 2017-03-04 MED ORDER — ASPIRIN EC 81 MG PO TBEC: 81.0000 mg | DELAYED_RELEASE_TABLET | Freq: Every day | ORAL | 3 refills | Status: DC

## 2017-03-04 NOTE — Patient Instructions (Signed)
Medication Instructions:  1) START ASPIRIN 81 mg daily  Labwork: TODAY: CBC  Testing/Procedures: None  Follow-Up: Please keep your follow-up appointments as scheduled!  Any Other Special Instructions Will Be Listed Below (If Applicable).     If you need a refill on your cardiac medications before your next appointment, please call your pharmacy.

## 2017-03-05 LAB — CBC WITH DIFFERENTIAL/PLATELET
BASOS: 1 %
Basophils Absolute: 0.1 10*3/uL (ref 0.0–0.2)
EOS (ABSOLUTE): 0.3 10*3/uL (ref 0.0–0.4)
EOS: 5 %
HEMATOCRIT: 34.2 % — AB (ref 37.5–51.0)
Hemoglobin: 10.5 g/dL — ABNORMAL LOW (ref 13.0–17.7)
Immature Grans (Abs): 0 10*3/uL (ref 0.0–0.1)
Immature Granulocytes: 0 %
LYMPHS ABS: 2.1 10*3/uL (ref 0.7–3.1)
Lymphs: 32 %
MCH: 25.7 pg — ABNORMAL LOW (ref 26.6–33.0)
MCHC: 30.7 g/dL — AB (ref 31.5–35.7)
MCV: 84 fL (ref 79–97)
MONOS ABS: 0.5 10*3/uL (ref 0.1–0.9)
Monocytes: 7 %
NEUTROS ABS: 3.7 10*3/uL (ref 1.4–7.0)
Neutrophils: 55 %
Platelets: 280 10*3/uL (ref 150–379)
RBC: 4.09 x10E6/uL — ABNORMAL LOW (ref 4.14–5.80)
RDW: 20.4 % — AB (ref 12.3–15.4)
WBC: 6.7 10*3/uL (ref 3.4–10.8)

## 2017-03-06 ENCOUNTER — Telehealth: Payer: Self-pay | Admitting: Physician Assistant

## 2017-03-06 NOTE — Telephone Encounter (Signed)
F/u message  Pt returning RN call .please call back to discuss

## 2017-03-06 NOTE — Telephone Encounter (Signed)
Spoke with patient about lab done 03/04/17

## 2017-03-10 ENCOUNTER — Ambulatory Visit (INDEPENDENT_AMBULATORY_CARE_PROVIDER_SITE_OTHER): Payer: Medicare Other | Admitting: Urology

## 2017-03-10 ENCOUNTER — Other Ambulatory Visit (HOSPITAL_COMMUNITY)
Admission: RE | Admit: 2017-03-10 | Discharge: 2017-03-10 | Disposition: A | Discharge status: Home / Self Care | Payer: Medicare Other | Source: Ambulatory Visit | Attending: Urology | Admitting: Urology

## 2017-03-10 DIAGNOSIS — C678 Malignant neoplasm of overlapping sites of bladder: Secondary | ICD-10-CM | POA: Diagnosis not present

## 2017-03-10 DIAGNOSIS — R8299 Other abnormal findings in urine: Secondary | ICD-10-CM | POA: Diagnosis not present

## 2017-03-13 ENCOUNTER — Encounter: Payer: Self-pay | Admitting: *Deleted

## 2017-03-17 ENCOUNTER — Ambulatory Visit (INDEPENDENT_AMBULATORY_CARE_PROVIDER_SITE_OTHER): Payer: Medicare Other | Admitting: Thoracic Surgery (Cardiothoracic Vascular Surgery)

## 2017-03-17 ENCOUNTER — Other Ambulatory Visit: Payer: Self-pay | Admitting: *Deleted

## 2017-03-17 ENCOUNTER — Encounter: Payer: Self-pay | Admitting: Thoracic Surgery (Cardiothoracic Vascular Surgery)

## 2017-03-17 VITALS — BP 114/70 | HR 98 | Resp 16 | Ht 70.0 in | Wt 164.2 lb

## 2017-03-17 DIAGNOSIS — R911 Solitary pulmonary nodule: Secondary | ICD-10-CM | POA: Diagnosis not present

## 2017-03-17 DIAGNOSIS — R918 Other nonspecific abnormal finding of lung field: Secondary | ICD-10-CM

## 2017-03-17 DIAGNOSIS — I350 Nonrheumatic aortic (valve) stenosis: Secondary | ICD-10-CM

## 2017-03-17 DIAGNOSIS — Z952 Presence of prosthetic heart valve: Secondary | ICD-10-CM

## 2017-03-17 NOTE — Progress Notes (Signed)
[address]       [place] [address]             [phone]       HPI: Mr. Quinter returns today to discuss possible surgical resection for his right upper lobe lung mass.  Mr. Pauley is a 76 year old gentleman with a remote history of tobacco abuse, severe aortic stenosis, COPD, chronic atrial fibrillation, hypertension, hyperlipidemia, abdominal aortic aneurysm, and bladder cancer. He presented in April with shortness of breath. His chest x-ray showed a right upper lobe lung nodule CT confirmed a 17 mm spiculated nodule in the right upper lobe. PET CT showed the nodule was hypermetabolic with an SUV of 6.7 there were some borderline mediastinal nodes but no definite pathologic adenopathy.  I saw him back in July. He did have a resectable mass in his pulmonary function testing was adequate, but he had critical aortic stenosis that had to be dealt with first. Cardiac catheterization revealed moderate LAD disease 60% that was managed medically. On 02/24/2017 he underwent TAVR. Preoperatively he was found to be anemic secondary to multiple gastric ulcerations. He was on Eliquis at that time. Postoperatively he was discharged on aspirin and Plavix.  He noted an immediate improvement in his shortness of breath after the TAVR procedure. He is not having any chest pain, pressure, or tightness. He quit smoking 11 years ago. Zubrod Score: At the time of surgery this patient's most appropriate activity status/level should be described as: []     0    Normal activity, no symptoms [x]     1    Restricted in physical strenuous activity but ambulatory, able to do out light work []     2    Ambulatory and capable of self care, unable to do work activities, up and about >50 % of waking hours                              []     3    Only limited self care, in bed greater than 50% of waking hours []     4    Completely disabled, no self care, confined to bed or chair []     5     Moribund  Past Medical History:  Diagnosis Date  . AAA (abdominal aortic aneurysm) ([place])   . Aortic stenosis, severe    a. 02/2017: s/p TAVR with Edwards Sapien 3 THV (size 26 mm, model # 9600TFX, serial # [id number])  . Arthritis   . Atherosclerotic peripheral vascular disease ([place])   . Atrial fibrillation, persistent ([place])    a. not a candidate for [place] given hx of GI bleed  . Bilateral carotid artery stenosis   . COPD (chronic obstructive pulmonary disease) ([place])   . Hyperlipidemia   . Hypertension   . Hypothyroidism   . Skin cancer   . Vitamin D deficiency    Past Surgical History:  Procedure Laterality Date  . ABDOMINAL AORTIC ANEURYSM REPAIR  08-09-09  . BACK SURGERY    . ESOPHAGOGASTRODUODENOSCOPY N/A 02/21/2017   Procedure: ESOPHAGOGASTRODUODENOSCOPY (EGD);  Surgeon: Jerene Bears, MD;  Location: [facility] ENDOSCOPY;  Service: Gastroenterology;  Laterality: N/A;  . HYDROCELE EXCISION / REPAIR    . SKIN CANCER EXCISION    . SPINE SURGERY    . TEE WITHOUT CARDIOVERSION N/A 02/24/2017   Procedure: TRANSESOPHAGEAL ECHOCARDIOGRAM (TEE);  Surgeon: Sherren Mocha, MD;  Location: [place];  Service: Open Heart Surgery;  Laterality: N/A;  . TRANSCATHETER AORTIC VALVE REPLACEMENT, TRANSFEMORAL N/A 02/24/2017   Procedure: TRANSCATHETER AORTIC VALVE REPLACEMENT, TRANSFEMORAL using a 66mm Edwards Sapien 3 Aortic Valve;  Surgeon: Sherren Mocha, MD;  Location: [place];  Service: Open Heart Surgery;  Laterality: N/A;  . TRANSURETHRAL RESECTION OF BLADDER TUMOR N/A 06/06/2016   Procedure: TRANSURETHRAL RESECTION OF BLADDER TUMOR (TURBT);  Surgeon: Franchot Gallo, MD;  [facility];  Service: Urology;  Laterality: N/A;  . TRANSURETHRAL RESECTION OF BLADDER TUMOR N/A 07/22/2016   Procedure: TRANSURETHRAL RESECTION OF BLADDER TUMOR (TURBT);  Surgeon: Franchot Gallo, MD;  [facility];  Service: Urology;  Laterality: N/A;   Current Outpatient Prescriptions  Medication Sig Dispense Refill  .  alfuzosin (UROXATRAL) 10 MG 24 hr tablet Take 10 mg by mouth at bedtime.     Marland Kitchen amLODipine (NORVASC) 5 MG tablet Take 1 tablet (5 mg total) by mouth daily. 30 tablet 5  . aspirin EC 81 MG tablet Take 1 tablet (81 mg total) by mouth daily. 90 tablet 3  . atorvastatin (LIPITOR) 40 MG tablet Take 1 tablet (40 mg total) by mouth daily. 30 tablet 5  . cholecalciferol (VITAMIN D) 1000 UNITS tablet Take 1 tablet (1,000 Units total) by mouth daily. 30 tablet 6  . clopidogrel (PLAVIX) 75 MG tablet Take 1 tablet (75 mg total) by mouth daily with breakfast. 30 tablet 6  . ferrous gluconate (FERGON) 324 MG tablet Take 1 tablet (324 mg total) by mouth 2 (two) times daily with a meal. 60 tablet 6  . levothyroxine (SYNTHROID, LEVOTHROID) 75 MCG tablet Take 1 tablet (75 mcg total) by mouth daily. 30 tablet 8  . lisinopril (PRINIVIL,ZESTRIL) 40 MG tablet Take 1 tablet (40 mg total) by mouth daily. 30 tablet 5  . metoprolol tartrate (LOPRESSOR) 25 MG tablet Take 0.5 tablets (12.5 mg total) by mouth 2 (two) times daily. 30 tablet 6  . Multiple Vitamin (ONE-A-DAY MENS PO) Take 1 tablet by mouth daily.      . Omega-3 Fatty Acids (FISH OIL) 1200 MG CAPS Take 2,400 mg by mouth daily.     . pantoprazole (PROTONIX) 40 MG tablet Take 1 tablet (40 mg total) by mouth 2 (two) times daily. 60 tablet 6   No current facility-administered medications for this visit.     Physical Exam BP 114/70 (BP Location: Right Arm, Patient Position: Sitting, Cuff Size: Normal)   Pulse 98   Resp 16   Ht 5\' 10"  (1.778 m)   Wt 164 lb 3.2 oz (74.5 kg)   SpO2 96% Comment: ON RA  BMI 23.8 kg/m  76 year old man in no acute distress Well-developed and well-nourished Alert and oriented 3 with no focal deficits Lungs clear with no rales or wheezing Cardiac irregularly irregular with no audible murmur No cervical or supraclavicular adenopathy Abdomen soft nontender Extremities without clubbing cyanosis or edema  Diagnostic Tests: I  personally reviewed his CT and PET CT as well as his CT angiogram from August. There was definite interval growth of the right upper lobe lung nodule between a CT in May and August. There also was an increase in the solid component of the nodule as well. Mediastinal lymph nodes appeared unchanged.  FVC 3.23 (76%) FEV1 1.98 (64%) FEV1 2.11 (69%) post bronchodilator DLCO 20.32 (61%)  Impression: Mr. Colquhoun is a 76 year old gentleman with a past history of tobacco abuse who has a 2.3 cm mixed solid/sub-solid nodule in the right upper lobe. Nodules first  found on a CT back in May. His workup was delayed by need for correction of severe symptomatic aortic stenosis which was completed about 3 weeks ago. In the interim he had a repeat CT in August which showed an increase in both the size and solid component of the right upper lobe nodule. This is highly suspicious for a new primary bronchogenic carcinoma. 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  I had a long discussion with Mr. Andres and his wife regarding treatment options for the lung cancer. The primary option in the interim this behind addressing his aortic valve was surgical resection. Secondary option is radiation. He came in today and said that he wanted to pursue radiation. I think this was primarily his wife's idea she is concerned about him undergoing surgery and he seemed rather indifferent between the 2. I explained to them that lobectomy and stereotactic radiation or not equivalent treatments although they are alternatives. Surgery has a higher risk up from an obvious discomfort, but a much better chance of cure than stereotactic radiation.  I described the proposed operation of right VATS for wedge resection followed by a lobectomy with Mr. and Mrs. Kirschenmann. They understand the general nature of the procedure including the need for general anesthesia, incisions be used, use of a drainage tube postoperatively, the expected hospital stay, and the  overall recovery, and the possibility of cure. I reviewed the indications, risks, benefits, and alternatives. They understand the risk include, but are not limited to death, MI, stroke, DVT, PE, bleeding, possible need for transfusion, infection, prolonged air leak, cardiac arrhythmias, pain, as well as the possibility of other unforeseeable complications.  After discussion Mr. Mrs. Pelzer would like additional time to talk with each other before making a decision is how he would like to proceed. The meantime I'm going go ahead and see if I can get him scheduled for a CT-guided needle biopsy and radiation oncology consult in case she decides to pursue that.  Plan: Mr. Linhares will call tomorrow and let me know how he would like to proceed.  Melrose Nakayama, MD [facility] [phone]

## 2017-03-23 NOTE — Progress Notes (Signed)
Cardiology Office Note    Date:  03/26/2017   ID:  [name] [name], DOB May 05, 1941, MRN [id number]  PCP:  [name], FNP  Cardiologist:  Dr. Bronson Ing / Dr. Burt Knack (TAVR)  CC: 1 month s/p TAVR   History of Present Illness:  [name] [name] is a 76 y.o. male with a history of HTN, HLD, previous heavy tobacco abuse, COPD, PAD, AAA s/p stent graft repair (2011), bladder cancer s/p TURBT, RUL hypermetabolic pulmonary nodule concerning for bronchogenic neoplasm, recently diagnosed persistent atrial fibrillation and severe AS s/p TAVR (02/24/17) who presents to clinic for 1 month follow up.   His most recent echo on 10/28/2016 showed severe AS witha mean gradient of 55 mm Hg with normal LV function.   [name] saw Dr. Roxan Hockey for evaluation of a 17 mm spiculated nodule in the RUL that was hypermetabolic on PET scan. This was found on a lung cancer screening CT on 11/10/2016. There was no pathologic adenopathy. It was felt to be most likely a lung cancer that would eventually require surgical removal. It was felt he would not tolerate surgery with severe AS and he was referred for TAVR. Plan was to consider thoracic surgery for hypermetabolic lung nodule once he recovered from TAVR.   Cath on 01/14/2017 showed 60% proximal LAD stenosis with mild non-obstructive disease in the LCX and RCA. Dr. Burt Knack felt that his LAD stenosis could be managed medically. He was seen by Dr. Cyndia Bent for surgical consultation and felt he would be at least moderate risk for SAVR and recommended TAVR. During that office visit he was noted to be in atrial fibrillation and started on Eliquis 31m BID. He tolerated this well but earlier this week it caused nausea and decreased appetite. He was told to stop Eliquis and hislast dose was 8/28. His TAVR date was set up for 02/24/17.  [facility] testing appointment in anticipation of TAVR the followingweek. ECG showed  afib with new ST/TW changes in the anterolateral leads and pre operative labs showed acute anemia with Hg down to5.9. He was admitted for acute anemia and acute diastolic CHF. He was transfused and blood counts stabalized. He was also treated with IV lasix. EGD on 02/21/17 showed non bleeding gastric ulcers. It was felt that with previous use of Eliquis, that this was likely the source of melena and anemia. He was started on iron and a PPI for treatment of ulcer disease.   He underwent successful TAVR with a 270mEdwards Sapien valve via the TF approach under MAC on 02/24/17. Postop Echo 02/25/17 showed no residual ASand trivial PVL. He was started on Aspirin 81 mg and Plavix 75 mg daily.  He saw Dr. HeYork Spanieln 03/17/17 and it has been decided to proceed with right VATS for wedge resection followed by a lobectomy for pulmonary nodule. This has been scheduled for 03/30/17. He has been holding his ASA and plavix in anticipation of surgery next week.   Today he presents to clinic for 1 month follow up s/p TAVR. No CP or SOB. No LE edema, orthopnea or PND. No dizziness or syncope. No blood in stool or urine. No palpitations.    Past Medical History:  Diagnosis Date  . AAA [place]) [place]  . Aortic stenosis, severe    a. 02/2017: s/p TAVR with Edwards Sapien 3 THV (size 26 mm, model # 9600TFX, serial # [id number] . Arthritis   .  Atherosclerotic peripheral vascular disease ([place])   . Atrial fibrillation, persistent ([name] [name])    a. not a candidate for [place] given hx of GI bleed  . Bilateral carotid artery stenosis   . COPD (chronic obstructive pulmonary disease) ([place])   . Hyperlipidemia   . Hypertension   . Hypothyroidism   . Lung cancer ([name] [name])    Right  . Lung mass    Right  . Skin cancer   . Vitamin D deficiency     Past Surgical History:  Procedure Laterality Date  . ABDOMINAL AORTIC ANEURYSM REPAIR  08-09-09  . BACK SURGERY    . ESOPHAGOGASTRODUODENOSCOPY N/A 02/21/2017    Procedure: ESOPHAGOGASTRODUODENOSCOPY (EGD);  Surgeon: [name] Bears, MD;  Location: [facility] ENDOSCOPY;  Service: Gastroenterology;  Laterality: N/A;  . HYDROCELE EXCISION / REPAIR    . SKIN CANCER EXCISION    . SPINE SURGERY    . TEE WITHOUT CARDIOVERSION N/A 02/24/2017   Procedure: TRANSESOPHAGEAL ECHOCARDIOGRAM (TEE);  Surgeon: [name] Mocha, MD;  Location: [place];  Service: Open Heart Surgery;  Laterality: N/A;  . TRANSCATHETER AORTIC VALVE REPLACEMENT, TRANSFEMORAL N/A 02/24/2017   Procedure: TRANSCATHETER AORTIC VALVE REPLACEMENT, TRANSFEMORAL using a 62m Edwards Sapien 3 Aortic Valve;  Surgeon: [name] Mocha MD;  [place]  Service: Open Heart Surgery;  Laterality: N/A;  . TRANSURETHRAL RESECTION OF BLADDER TUMOR N/A 06/06/2016   Procedure: TRANSURETHRAL RESECTION OF BLADDER TUMOR (TURBT);  Surgeon: [name] Gallo MD;  Location: [facility];  Service: Urology;  Laterality: N/A;  . TRANSURETHRAL RESECTION OF BLADDER TUMOR N/A 07/22/2016   Procedure: TRANSURETHRAL RESECTION OF BLADDER TUMOR (TURBT);  Surgeon: [name] Gallo MD;  Location: [facility];  Service: Urology;  Laterality: N/A;    Current Medications: Outpatient Medications Prior to Visit  Medication Sig Dispense Refill  . alfuzosin (UROXATRAL) 10 MG 24 hr tablet Take 10 mg by mouth at bedtime.     .Marland KitchenamLODipine (NORVASC) 5 MG tablet Take 1 tablet (5 mg total) by mouth daily. 30 tablet 5  . aspirin EC 81 MG tablet Take 1 tablet (81 mg total) by mouth daily. 90 tablet 3  . atorvastatin (LIPITOR) 40 MG tablet Take 1 tablet (40 mg total) by mouth daily. 30 tablet 5  . cholecalciferol (VITAMIN D) 1000 UNITS tablet Take 1 tablet (1,000 Units total) by mouth daily. 30 tablet 6  . clopidogrel (PLAVIX) 75 MG tablet Take 1 tablet (75 mg total) by mouth daily with breakfast. 30 tablet 6  . ferrous gluconate (FERGON) 324 MG tablet Take 1 tablet (324 mg total) by mouth 2 (two) times daily with a meal. 60 tablet 6  . levothyroxine  (SYNTHROID, LEVOTHROID) 75 MCG tablet Take 1 tablet (75 mcg total) by mouth daily. 30 tablet 8  . lisinopril (PRINIVIL,ZESTRIL) 40 MG tablet Take 1 tablet (40 mg total) by mouth daily. 30 tablet 5  . metoprolol tartrate (LOPRESSOR) 25 MG tablet Take 0.5 tablets (12.5 mg total) by mouth 2 (two) times daily. 30 tablet 6  . Multiple Vitamin (ONE-A-DAY MENS PO) Take 1 tablet by mouth daily.      . Omega-3 Fatty Acids (FISH OIL) 1200 MG CAPS Take 2,400 mg by mouth daily.     . pantoprazole (PROTONIX) 40 MG tablet Take 1 tablet (40 mg total) by mouth 2 (two) times daily. 60 tablet 6   No facility-administered medications prior to visit.      Allergies:   Patient has no known allergies.   Social History   Social History  .  Marital status: Married    Spouse name: N/A  . Number of children: N/A  . Years of education: N/A   Occupational History  . 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 Smoking status: Former Smoker    Packs/day: 1.00    Years: 51.00    Types: Cigarettes    Start date: 11/25/1954    Quit date: 10/27/2005  . Smokeless tobacco: Never Used  . Alcohol use No     Comment: none since 1999  . Drug use: No  . Sexual activity: Yes   Other Topics Concern  . None   Social History Narrative  . None     Family History:  The patient's family history includes Alcohol abuse in his father; COPD in his mother and sister; Cancer in his mother; Gout in his brother and brother; Heart attack in his father and son; Heart disease in his father; Hypertension in his brother; Lupus in his sister; Prostatitis in his brother.     ROS:   Please see the history of present illness.    ROS All other systems reviewed and are negative.   PHYSICAL EXAM:   VS:  BP 120/60   Pulse 95   Ht _0  (1.753 m)   Wt 167 lb (75.8 kg)   SpO2 97%   BMI 24.66 kg/m    GEN: Well nourished, well developed, in no acute distress  HEENT: normal  Neck: no JVD, carotid  bruits, or masses Cardiac: irreg irreg; no murmurs, rubs, or gallops,no edema  Respiratory:  clear to auscultation bilaterally, normal work of breathing GI: soft, nontender, nondistended, + BS MS: no deformity or atrophy  Skin: warm and dry, no rash Neuro:  Alert and Oriented x 3, Strength and sensation are intact Psych: euthymic mood, full affect    Wt Readings from Last 3 Encounters:  03/25/17 165 lb 14.4 oz (75.3 kg)  03/25/17 167 lb (75.8 kg)  03/17/17 164 lb 3.2 oz (74.5 kg)      Studies/Labs Reviewed:   EKG:  EKG is ordered today.  The ekg ordered today demonstrates atrial fibrillation with PVCs HR 85  Recent Labs: 10/20/2016: TSH 4.490 02/25/2017: Magnesium 1.8 03/25/2017: ALT 19; BUN 14; Creatinine, Ser 0.99; Hemoglobin 10.4; Platelets 104; Potassium 4.3; Sodium 137   Lipid Panel    Component Value Date/Time   CHOL 84 (L) 10/20/2016 1022   CHOL 118 01/14/2013 1157   TRIG 78 10/20/2016 1022   TRIG 160 (H) 04/13/2014 0846   TRIG 178 (H) 01/14/2013 1157   HDL 27 (L) 10/20/2016 1022   HDL 32 (L) 04/13/2014 0846   HDL 30 (L) 01/14/2013 1157   CHOLHDL 3.1 10/20/2016 1022   LDLCALC 41 10/20/2016 1022   LDLCALC 61 04/13/2014 0846   LDLCALC 52 01/14/2013 1157    Additional studies/ records that were reviewed today include:   Date of Procedure:02/24/2017  Preoperative Diagnosis:Severe Aortic Stenosis   Postoperative Diagnosis:Same   Procedure:   Transcatheter Aortic Valve Replacement - Percutaneous Transfemoral Approach Edwards Sapien 3 THV (size 90m, model # 9600TFX, serial # [id number]  Co-Surgeons:Bryan KAlveria Apley MD and MSherren Mocha MD  Anesthesiologist:Ryan ERoanna Banning MD  EDala Dock MD  Pre-operative Echo Findings: ? Severe aortic stenosis ? Normalleft ventricular systolic function  Post-operative Echo  Findings: ? Traceparavalvular leak ? Normalleft ventricular systolic function  _____________   2D ECHO: 02/25/2017 Study Conclusions - Left ventricle: The cavity size was normal. Wall  thickness was increased in a pattern of mild LVH. Systolic function was normal. The estimated ejection fraction was in the range of 50% to 55%. Wall motion was normal; there were no regional wall motion abnormalities. - Aortic valve: A bioprosthesis was present. There was trivial regurgitation. Valve area (VTI): 1.56 cm^2. Valve area (Vmax): 1.44 cm^2. Valve area (Vmean): 1.7 cm^2. - Mitral valve: Calcified annulus. There was mild regurgitation. - Left atrium: The atrium was severely dilated. - Right atrium: The atrium was moderately dilated. - Pulmonary arteries: Systolic pressure was mildly increased. Impressions: - Normal LV systolic function; mild LVH; s/p TAVR with mean gradient 9 mmHg and trivial AI; mild MR; biatrial enlargment; mild TR with mildly elevated pulmonary pressure.  _____________  2D ECHO: 03/25/17 Study Conclusions - Left ventricle: The cavity size was normal. There was moderate   concentric hypertrophy. Systolic function was normal. The   estimated ejection fraction was in the range of 55% to 60%. Wall   motion was normal; there were no regional wall motion   abnormalities. - Aortic valve: A TAVR 48m Edwards Sapien 3 bioprosthesis was   present and functioning normally. Placed on 02/24/17. - Left atrium: The atrium was mildly dilated. Volume/bsa, ES   (1-plane Simpson&'s, A4C): 35.7 ml/m^2. - Pulmonary arteries: Systolic pressure was mildly increased. PA   peak pressure: 36 mm Hg (S). Impressions: - Since prior study, EF has mildly improved.   ASSESSMENT & PLAN:   Severe AS s/p TAVR: NHYA class I symptoms. 1 month echo showed a well seated valve with no PVL and mean gradient of 12 and peak gradient of 29. ASA and plavix currently on hold in  anticipation of surgery next week. Plavix can be discontinued 6 months s/p TAVR. SBE prophylaxis reviewed. Plan for follow up in 1 year with an echocardiogram. He will continue regular follow up with Dr. KBronson Ing   Anemia: HG stable   Chronic diastolic CHF: appears euvolemic. Not on diuretics.  Persistent atrial fibrillation: rate well controlled on Lopressor 12.587mBID. CHADSVASC of at least 5 but not a [facility] recent [facility] bleed on Eliquis  HTN: BP well controlled.   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   CAD: stable. Continue medical management.    Medication Adjustments/Labs and Tests Ordered: Current medicines are reviewed at length with the patient today.  Concerns regarding medicines are outlined above.  Medication changes, Labs and Tests ordered today are listed in the Patient Instructions below. Patient Instructions  Medication Instructions:  Your physician recommends that you continue on your current medications as directed. Please refer to the Current Medication list given to you today.   Labwork: none  Testing/Procedures: Your physician has requested that you have an echocardiogram. Echocardiography is a painless test that uses sound waves to create images of your heart. It provides your doctor with information about the size and shape of your heart and how well your heart's chambers and valves are working. This procedure takes approximately one hour. There are no restrictions for this procedure.  To be done in 11 months on day of appointment with K. ThGrandville SilosPAU[place]Follow-Up: You have an appointment with Dr. KoBronson Ingn November 28,2018 at 11:20  Your physician wants you to follow-up in: 11 months with K. ThGrandville SilosPAUtahou will receive a reminder letter in the mail two months in advance. If you don't receive a letter, please call our office to schedule the follow-up  appointment.   Any Other Special Instructions Will Be Listed Below (If Applicable).     If you need a refill on your cardiac medications before your next appointment, please call your pharmacy.      Signed, Angelena Form, PA-C  03/26/2017 9:52 AM    [facility] [address], [place], [place]  [address] Phone: [phone]; Fax: [phone]

## 2017-03-24 ENCOUNTER — Ambulatory Visit (INDEPENDENT_AMBULATORY_CARE_PROVIDER_SITE_OTHER): Payer: Medicare Other | Admitting: Urology

## 2017-03-24 DIAGNOSIS — C678 Malignant neoplasm of overlapping sites of bladder: Secondary | ICD-10-CM | POA: Diagnosis not present

## 2017-03-24 NOTE — Pre-Procedure Instructions (Signed)
[name] [name]  03/24/2017      [place], [place] [address] Phone: [phone] Fax: [phone]  [name], [name] [place] [address] Phone: [phone] Fax: [phone]  [id number] [place], [place] [address] Phone: [phone] Fax: [phone]    Your procedure is scheduled on Monday, March 30, 2017  Report to [facility] Admitting Entrance "A" at 7:30 A.M.   Call this number if you have problems the morning of surgery:  [phone]   Remember:  Do not eat food or drink liquids after midnight.  Take these medicines the morning of surgery with A SIP OF WATER: AmLODipine (NORVASC), Levothyroxine (SYNTHROID, LEVOTHROID), Metoprolol tartrate (LOPRESSOR), Pantoprazole (PROTONIX).  Follow your doctor's instruction for taking ASPIRIN AND PLAVIX.  As of today stop taking all Aspirin products, Vitamins, Fish oils, and Herbal medications. Also stop all NSAIDS i.e. Advil, Motrin, Aleve, Anaprox, Naproxen, BC and Goody Powders.   Do not wear jewelry.  Do not wear lotions, powders, or perfumes, or deodorant.  Do not shave 48 hours prior to surgery.  Men may shave face and neck.  Do not bring valuables to the hospital.  [facility] is not responsible for any belongings or valuables.  Contacts, dentures or bridgework may not be worn into surgery.  Leave your suitcase in the car.  After surgery it may be brought to your room.  For patients admitted to the hospital, discharge time will be determined by your treatment team.  Patients discharged the day of surgery will not be allowed to drive home.   Special instructions:  [place]- Preparing For Surgery  Before surgery, you can play an important role. Because skin is not sterile, your skin needs to be as free of germs as  possible. You can reduce the number of germs on your skin by washing with CHG (chlorahexidine gluconate) Soap before surgery.  CHG is an antiseptic cleaner which kills germs and bonds with the skin to continue killing germs even after washing.  Please do not use if you have an allergy to CHG or antibacterial soaps. If your skin becomes reddened/irritated stop using the CHG.  Do not shave (including legs and underarms) for at least 48 hours prior to first CHG shower. It is OK to shave your face.  Please follow these instructions carefully.   1. Shower the NIGHT BEFORE SURGERY and the MORNING OF SURGERY with CHG.   2. If you chose to wash your hair, wash your hair first as usual with your normal shampoo.  3. After you shampoo, rinse your hair and body thoroughly to remove the shampoo.  4. Use CHG as you would any other liquid soap. You can apply CHG directly to the skin and wash gently with a scrungie or a clean washcloth.   5. Apply the CHG Soap to your body ONLY FROM THE NECK DOWN.  Do not use on open wounds or open sores. Avoid contact with your eyes, ears, mouth and genitals (private parts). Wash genitals (private parts) with your normal soap.  USE REGULAR SHAMPOO AND CONDITIONER FOR HAIR USE REGULAR SOAP FOR FACE AND PRIVATE AREA  6. Wash thoroughly, paying special attention to the area where your surgery will be performed.  7. Thoroughly rinse your body with warm water from the neck down.  8. DO NOT shower/wash with your normal soap after using and rinsing off the CHG Soap.  9. Pat yourself dry with a CLEAN TOWEL and [place] CLOTH  10. Wear CLEAN PAJAMAS to bed the night before surgery, wear comfortable clothes the morning of surgery  11. Place CLEAN SHEETS on your bed the night of your first shower and DO NOT SLEEP WITH PETS.  Day of Surgery: Do not apply any deodorants/lotions. Please wear clean clothes to the hospital/surgery center.    Please read over the following fact  sheets that you were given. Pain Booklet, Coughing and Deep Breathing, Blood Transfusion Information, MRSA Information and Surgical Site Infection Prevention

## 2017-03-25 ENCOUNTER — Encounter (HOSPITAL_COMMUNITY): Payer: Self-pay

## 2017-03-25 ENCOUNTER — Other Ambulatory Visit: Payer: Self-pay

## 2017-03-25 ENCOUNTER — Ambulatory Visit (HOSPITAL_BASED_OUTPATIENT_CLINIC_OR_DEPARTMENT_OTHER): Payer: Medicare Other

## 2017-03-25 ENCOUNTER — Encounter: Payer: Self-pay | Admitting: Physician Assistant

## 2017-03-25 ENCOUNTER — Encounter (HOSPITAL_COMMUNITY)
Admission: RE | Admit: 2017-03-25 | Discharge: 2017-03-25 | Disposition: A | Discharge status: Home / Self Care | Payer: Medicare Other | Source: Ambulatory Visit | Attending: Thoracic Surgery (Cardiothoracic Vascular Surgery) | Admitting: Thoracic Surgery (Cardiothoracic Vascular Surgery)

## 2017-03-25 ENCOUNTER — Ambulatory Visit (INDEPENDENT_AMBULATORY_CARE_PROVIDER_SITE_OTHER): Payer: Medicare Other | Admitting: Physician Assistant

## 2017-03-25 VITALS — BP 120/60 | HR 95 | Ht 69.0 in | Wt 167.0 lb

## 2017-03-25 DIAGNOSIS — I350 Nonrheumatic aortic (valve) stenosis: Secondary | ICD-10-CM | POA: Diagnosis not present

## 2017-03-25 DIAGNOSIS — Z01818 Encounter for other preprocedural examination: Secondary | ICD-10-CM | POA: Diagnosis not present

## 2017-03-25 DIAGNOSIS — I5032 Chronic diastolic (congestive) heart failure: Secondary | ICD-10-CM

## 2017-03-25 DIAGNOSIS — D6489 Other specified anemias: Secondary | ICD-10-CM | POA: Diagnosis not present

## 2017-03-25 DIAGNOSIS — R911 Solitary pulmonary nodule: Secondary | ICD-10-CM | POA: Diagnosis not present

## 2017-03-25 DIAGNOSIS — Z952 Presence of prosthetic heart valve: Secondary | ICD-10-CM

## 2017-03-25 DIAGNOSIS — I420 Dilated cardiomyopathy: Secondary | ICD-10-CM | POA: Insufficient documentation

## 2017-03-25 DIAGNOSIS — I4819 Other persistent atrial fibrillation: Secondary | ICD-10-CM

## 2017-03-25 DIAGNOSIS — I481 Persistent atrial fibrillation: Secondary | ICD-10-CM

## 2017-03-25 DIAGNOSIS — R918 Other nonspecific abnormal finding of lung field: Secondary | ICD-10-CM | POA: Diagnosis not present

## 2017-03-25 DIAGNOSIS — I2510 Atherosclerotic heart disease of native coronary artery without angina pectoris: Secondary | ICD-10-CM | POA: Diagnosis not present

## 2017-03-25 DIAGNOSIS — I10 Essential (primary) hypertension: Secondary | ICD-10-CM

## 2017-03-25 HISTORY — DX: Malignant neoplasm of unspecified part of unspecified bronchus or lung: C34.90

## 2017-03-25 HISTORY — DX: Other nonspecific abnormal finding of lung field: R91.8

## 2017-03-25 LAB — COMPREHENSIVE METABOLIC PANEL
ALBUMIN: 4 g/dL (ref 3.5–5.0)
ALK PHOS: 72 U/L (ref 38–126)
ALT: 19 U/L (ref 17–63)
ANION GAP: 9 (ref 5–15)
AST: 33 U/L (ref 15–41)
BUN: 14 mg/dL (ref 6–20)
CALCIUM: 9.2 mg/dL (ref 8.9–10.3)
CO2: 22 mmol/L (ref 22–32)
CREATININE: 0.99 mg/dL (ref 0.61–1.24)
Chloride: 106 mmol/L (ref 101–111)
GFR calc Af Amer: >60 mL/min (ref >60)
GFR calc non Af Amer: >60 mL/min (ref >60)
GLUCOSE: 107 mg/dL — AB (ref 65–99)
Potassium: 4.3 mmol/L (ref 3.5–5.1)
SODIUM: 137 mmol/L (ref 135–145)
Total Bilirubin: 0.9 mg/dL (ref 0.3–1.2)
Total Protein: 7.6 g/dL (ref 6.5–8.1)

## 2017-03-25 LAB — URINALYSIS, ROUTINE W REFLEX MICROSCOPIC
BILIRUBIN URINE: NEGATIVE
Bacteria, UA: NONE SEEN
GLUCOSE, UA: NEGATIVE mg/dL
Hgb urine dipstick: NEGATIVE
KETONES UR: NEGATIVE mg/dL
LEUKOCYTES UA: NEGATIVE
Nitrite: NEGATIVE
PROTEIN: 100 mg/dL — AB
Specific Gravity, Urine: 1.018 (ref 1.005–1.030)
Squamous Epithelial / LPF: NONE SEEN
pH: 7 (ref 5.0–8.0)

## 2017-03-25 LAB — CBC
HCT: 33.3 % — ABNORMAL LOW (ref 39.0–52.0)
HEMOGLOBIN: 10.4 g/dL — AB (ref 13.0–17.0)
MCH: 26.7 pg (ref 26.0–34.0)
MCHC: 31.2 g/dL (ref 30.0–36.0)
MCV: 85.4 fL (ref 78.0–100.0)
Platelets: 104 10*3/uL — ABNORMAL LOW (ref 150–400)
RBC: 3.9 MIL/uL — AB (ref 4.22–5.81)
RDW: 20.3 % — ABNORMAL HIGH (ref 11.5–15.5)
WBC: 4.9 10*3/uL (ref 4.0–10.5)

## 2017-03-25 LAB — BLOOD GAS, ARTERIAL
ACID-BASE EXCESS: 1.3 mmol/L (ref 0.0–2.0)
BICARBONATE: 24.8 mmol/L (ref 20.0–28.0)
Drawn by: 470591
FIO2: 21
O2 Saturation: 96.4 %
PH ART: 7.453 — AB (ref 7.350–7.450)
PO2 ART: 85.7 mmHg (ref 83.0–108.0)
Patient temperature: 98.6
pCO2 arterial: 36 mmHg (ref 32.0–48.0)

## 2017-03-25 LAB — APTT: APTT: 32 s (ref 24–36)

## 2017-03-25 LAB — PROTIME-INR
INR: 1.15
Prothrombin Time: 14.6 seconds (ref 11.4–15.2)

## 2017-03-25 LAB — SURGICAL PCR SCREEN
MRSA, PCR: NEGATIVE
STAPHYLOCOCCUS AUREUS: NEGATIVE

## 2017-03-25 LAB — ECHOCARDIOGRAM COMPLETE
Height: 70 in
Weight: 2654.4 oz

## 2017-03-25 NOTE — Progress Notes (Signed)
[facility]  Cardiologist - Dr. Evalee Mutton and [facility]  Chest x-ray - 02/25/17- Repeat ordered DOS  EKG - 03/04/17 (E)  Stress Test - Denies  ECHO - 03/02/17 (E)  Cardiac Cath - 01/14/17 (E)  Sleep Study - No CPAP - None  Chart will be given for review by anesthesia due to cardiac history.  Pt denies having chest pain, sob, or fever at this time. Pt sts he his last dose of Aspirin and Plavix was 10/2, and he will resume after the surgery. All instructions explained to the pt, with a verbal understanding of the material. Pt agrees to go over the instructions while at home for a better understanding. The opportunity to ask questions was provided.

## 2017-03-25 NOTE — Patient Instructions (Signed)
Medication Instructions:  Your physician recommends that you continue on your current medications as directed. Please refer to the Current Medication list given to you today.   Labwork: none  Testing/Procedures: Your physician has requested that you have an echocardiogram. Echocardiography is a painless test that uses sound waves to create images of your heart. It provides your doctor with information about the size and shape of your heart and how well your heart's chambers and valves are working. This procedure takes approximately one hour. There are no restrictions for this procedure.  To be done in 11 months on day of appointment with K. Grandville Silos, [place]  Follow-Up: You have an appointment with Dr. Bronson Ing on November 28,2018 at 11:20  Your physician wants you to follow-up in: 11 months with K. Grandville Silos, [place] You will receive a reminder letter in the mail two months in advance. If you don't receive a letter, please call our office to schedule the follow-up appointment.   Any Other Special Instructions Will Be Listed Below (If Applicable).     If you need a refill on your cardiac medications before your next appointment, please call your pharmacy.

## 2017-03-26 ENCOUNTER — Other Ambulatory Visit (HOSPITAL_COMMUNITY): Payer: Medicare Other

## 2017-03-26 NOTE — Addendum Note (Signed)
Addended by: Mendel Ryder on: 03/26/2017 05:05 PM   Modules accepted: Orders

## 2017-03-26 NOTE — Progress Notes (Signed)
Anesthesia Chart Review: Patient is a 76 year old male scheduled for right VATS, wedge resection, possible RU lobectomy on 03/30/17 by Dr. Modesto Charon.   History includes former smoker (quit '07), severe AS s/p TAVR 02/24/17, CAD (moderate LAD) 12/2016, AAA s/p EVAR 08/09/09, afib (new onset 12/2016), bilateral ICA stenosis (mild 07/2016), hypothyroidism, COPD, skin cancer excision, bladder cancer s/p TURBT 07/22/16 & 51/76/16, hypermetabolic RUL lung nodule, symptomatic anemia with UGIB 02/20/17 (in the setting of Eliquis; EGD showed non-bleeding gastric ulcers, s/p 2 Units PRBCs).  - PCP is [name] Hassell Done, [place]. - Primary cardiologist is Dr. Kate Sable. He is seeing Dr. Sherren Mocha for TAVR. His last TAVR follow-up visit was with Angelena Form, PA-C on 03/25/17. - Pulmonologiost is Dr. Kara Mead. He was referred patient to CT surgeon Dr. Modesto Charon to evaluate RUL nodule in July, but further work-up was postponed until he recovered from his TAVR.   - Vascular surgeon is Dr. Theotis Burrow. - Urologist is Dr. Franchot Gallo.  Meds include Uroxatral, amlodipine, ASA 81 mg (last dose 03/24/17), Lipitor, Plavix (last dose 03/24/17), ferrous gluconate, levothyroxine, lisinopril, Lopressor, fish oil, Protonix.    BP 130/79   Pulse (!) 51   Temp 36.5 C   Resp 18   Ht 5\' 10"  (1.778 m)   Wt 165 lb 14.4 oz (75.3 kg)   SpO2 98%   BMI 23.80 kg/m   EKG 03/04/17: Afib at 87 bpm, T wave abnormality, consider lateral ischemia.  Echo 03/25/17: Study Conclusions - Left ventricle: The cavity size was normal. There was moderate   concentric hypertrophy. Systolic function was normal. The   estimated ejection fraction was in the range of 55% to 60%. Wall   motion was normal; there were no regional wall motion   abnormalities. - Aortic valve: A TAVR 98mm Edwards Sapien 3 bioprosthesis was   present and functioning normally. Placed on 02/24/17. - Left atrium: The atrium was  mildly dilated. Volume/bsa, ES   (1-plane Simpson&'s, A4C): 35.7 ml/m^2. - Pulmonary arteries: Systolic pressure was mildly increased. PA   peak pressure: 36 mm Hg (S). Impressions: - Since prior study, EF has mildly improved.  Cardiac cath 01/14/17: 1. Moderate proximal LAD stenosis 2. Patent left main, left circumflex, and RCA with mild nonobstructive disease 3. Severely calcified and restricted aortic valve with known severe aortic stenosis by noninvasive assessment 4. Mild pulmonary hypertension likely related to LV diastolic dysfunction in the setting of severe aortic stenosis Recommendation: The patient will continue evaluation for aortic valve replacement for treatment of severe symptomatic aortic stenosis. Will discuss his case further with Dr. Roxan Hockey. Likely move forward with TAVR evaluation with CT angio studies as next step. I think his proximal LAD stenosis can be treated medically.  Carotid U/S 07/28/16: Impression: BICA velocities suggest a < 40% stenosis.  CTA chest/abd/pelvis 01/29/17: IMPRESSION: 1. Vascular findings and measurements pertinent to potential TAVR procedure, as detailed above. This patient may have suitable pelvic arterial access for low profile devices. 2. Severe thickening calcification of the aortic valve, compatible with the reported clinical history of severe aortic stenosis. 3. Cardiomegaly with biatrial dilatation. Notably, there is a filling defect in the tip of the left atrial appendage concerning for left atrial appendage thrombus. This places the patient at risk for systemic embolization, and could be confirmed with transesophageal echocardiography if clinically appropriate. 4. Aortic atherosclerosis, in addition to left main and 3 vessel coronary artery disease. Patient is also status post aortoiliac bypass graft which  appears widely patent bilaterally. Please note the proximal twist of the limbs of the graft, as discussed above. 5. 2.3 x  1.4 x 1.6 cm macrolobulated slightly spiculated right upper lobe pulmonary nodule remains highly concerning for primary bronchogenic neoplasm. There multiple prominent but nonenlarged mediastinal and hilar lymph nodes which are nonspecific. No other signs of potential metastatic disease noted elsewhere in the chest, abdomen or pelvis. 6. Mild diffuse bronchial wall thickening with mild centrilobular and paraseptal emphysema; imaging findings suggestive of underlying COPD. 7. Cholelithiasis without evidence of acute cholecystitis at this time. 8. 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 9. Additional incidental findings, as above.  PET Scan 12/02/16: IMPRESSION: 1. Anterior right upper lobe spiculated pulmonary nodule is markedly hypermetabolic, consistent with neoplasm. 2. Upper normal size mediastinal lymph nodes show low level FDG accumulation. While these findings are not definite for metastatic disease, metastatic involvement cannot be excluded. No findings to suggest hypermetabolic metastatic disease in the neck, abdomen or pelvis. 3. Focal area of hypermetabolism identified in the right prostate gland. This does raise concern for prostate neoplasm.  He is for a 2V CXR on the day of surgery.   PFTs 12/19/16: FVC 3.23 (76%), FEV1 1.98 (64% pre; 69% post), DLCO unc 17.91 (54%), DLCO cor 20.32 (61%).  Preoperative labs noted. Cr. 0.99. Glucose 107. H/H 10.4/33.3. PLT count 104K (last month 150-280 K). PT/PTT WNL.UA WNL. Patient needs repeat T&S on the day of surgery due to recent history of transfusion. Defer decision for repeat PLT count pre-operatively to surgeon and/or anesthesiologist. (I did not reorder since > 100K.) Surgeon can follow platelet count post-operatively.   If no acute changes then I would anticipate that he can proceed as planned.   [facility] Short Stay Center/Anesthesiology Phone [phone] 03/26/2017 12:30  PM

## 2017-03-30 ENCOUNTER — Inpatient Hospital Stay (HOSPITAL_COMMUNITY): Payer: Medicare Other | Admitting: Emergency Medicine

## 2017-03-30 ENCOUNTER — Encounter (HOSPITAL_COMMUNITY)
Admission: RE | Disposition: A | Discharge status: Home / Self Care | Payer: Self-pay | Source: Ambulatory Visit | Attending: Thoracic Surgery (Cardiothoracic Vascular Surgery)

## 2017-03-30 ENCOUNTER — Inpatient Hospital Stay (HOSPITAL_COMMUNITY)
Admission: RE | Admit: 2017-03-30 | Discharge: 2017-04-07 | DRG: 164 | Disposition: A | Discharge status: Home / Self Care | Payer: Medicare Other | Source: Ambulatory Visit | Attending: Thoracic Surgery (Cardiothoracic Vascular Surgery) | Admitting: Thoracic Surgery (Cardiothoracic Vascular Surgery)

## 2017-03-30 ENCOUNTER — Inpatient Hospital Stay (HOSPITAL_COMMUNITY): Payer: Medicare Other | Admitting: Certified Registered"

## 2017-03-30 ENCOUNTER — Inpatient Hospital Stay (HOSPITAL_COMMUNITY): Payer: Medicare Other

## 2017-03-30 ENCOUNTER — Encounter (HOSPITAL_COMMUNITY): Payer: Self-pay | Admitting: *Deleted

## 2017-03-30 DIAGNOSIS — J982 Interstitial emphysema: Secondary | ICD-10-CM | POA: Diagnosis not present

## 2017-03-30 DIAGNOSIS — R911 Solitary pulmonary nodule: Secondary | ICD-10-CM | POA: Diagnosis present

## 2017-03-30 DIAGNOSIS — I6529 Occlusion and stenosis of unspecified carotid artery: Secondary | ICD-10-CM | POA: Diagnosis present

## 2017-03-30 DIAGNOSIS — Z79899 Other long term (current) drug therapy: Secondary | ICD-10-CM

## 2017-03-30 DIAGNOSIS — J9383 Other pneumothorax: Secondary | ICD-10-CM | POA: Diagnosis not present

## 2017-03-30 DIAGNOSIS — J9382 Other air leak: Secondary | ICD-10-CM | POA: Diagnosis not present

## 2017-03-30 DIAGNOSIS — D696 Thrombocytopenia, unspecified: Secondary | ICD-10-CM | POA: Diagnosis present

## 2017-03-30 DIAGNOSIS — Z85828 Personal history of other malignant neoplasm of skin: Secondary | ICD-10-CM | POA: Diagnosis not present

## 2017-03-30 DIAGNOSIS — I130 Hypertensive heart and chronic kidney disease with heart failure and stage 1 through stage 4 chronic kidney disease, or unspecified chronic kidney disease: Secondary | ICD-10-CM | POA: Diagnosis present

## 2017-03-30 DIAGNOSIS — Z4682 Encounter for fitting and adjustment of non-vascular catheter: Secondary | ICD-10-CM

## 2017-03-30 DIAGNOSIS — J9811 Atelectasis: Secondary | ICD-10-CM | POA: Diagnosis not present

## 2017-03-30 DIAGNOSIS — I5032 Chronic diastolic (congestive) heart failure: Secondary | ICD-10-CM | POA: Diagnosis not present

## 2017-03-30 DIAGNOSIS — Z7982 Long term (current) use of aspirin: Secondary | ICD-10-CM

## 2017-03-30 DIAGNOSIS — C3411 Malignant neoplasm of upper lobe, right bronchus or lung: Secondary | ICD-10-CM | POA: Diagnosis not present

## 2017-03-30 DIAGNOSIS — K760 Fatty (change of) liver, not elsewhere classified: Secondary | ICD-10-CM | POA: Diagnosis not present

## 2017-03-30 DIAGNOSIS — D62 Acute posthemorrhagic anemia: Secondary | ICD-10-CM | POA: Diagnosis not present

## 2017-03-30 DIAGNOSIS — Z7902 Long term (current) use of antithrombotics/antiplatelets: Secondary | ICD-10-CM

## 2017-03-30 DIAGNOSIS — E559 Vitamin D deficiency, unspecified: Secondary | ICD-10-CM | POA: Diagnosis present

## 2017-03-30 DIAGNOSIS — Z8551 Personal history of malignant neoplasm of bladder: Secondary | ICD-10-CM

## 2017-03-30 DIAGNOSIS — J60 Coalworker's pneumoconiosis: Secondary | ICD-10-CM | POA: Diagnosis not present

## 2017-03-30 DIAGNOSIS — E785 Hyperlipidemia, unspecified: Secondary | ICD-10-CM | POA: Diagnosis present

## 2017-03-30 DIAGNOSIS — I482 Chronic atrial fibrillation: Secondary | ICD-10-CM | POA: Diagnosis present

## 2017-03-30 DIAGNOSIS — N189 Chronic kidney disease, unspecified: Secondary | ICD-10-CM | POA: Diagnosis not present

## 2017-03-30 DIAGNOSIS — J939 Pneumothorax, unspecified: Secondary | ICD-10-CM

## 2017-03-30 DIAGNOSIS — J439 Emphysema, unspecified: Secondary | ICD-10-CM | POA: Diagnosis not present

## 2017-03-30 DIAGNOSIS — J449 Chronic obstructive pulmonary disease, unspecified: Secondary | ICD-10-CM | POA: Diagnosis present

## 2017-03-30 DIAGNOSIS — Z87891 Personal history of nicotine dependence: Secondary | ICD-10-CM | POA: Diagnosis not present

## 2017-03-30 DIAGNOSIS — I129 Hypertensive chronic kidney disease with stage 1 through stage 4 chronic kidney disease, or unspecified chronic kidney disease: Secondary | ICD-10-CM | POA: Diagnosis not present

## 2017-03-30 DIAGNOSIS — K824 Cholesterolosis of gallbladder: Secondary | ICD-10-CM | POA: Diagnosis present

## 2017-03-30 DIAGNOSIS — J8410 Pulmonary fibrosis, unspecified: Secondary | ICD-10-CM | POA: Diagnosis not present

## 2017-03-30 DIAGNOSIS — R918 Other nonspecific abnormal finding of lung field: Secondary | ICD-10-CM

## 2017-03-30 DIAGNOSIS — I350 Nonrheumatic aortic (valve) stenosis: Secondary | ICD-10-CM | POA: Diagnosis present

## 2017-03-30 DIAGNOSIS — E039 Hypothyroidism, unspecified: Secondary | ICD-10-CM | POA: Diagnosis present

## 2017-03-30 DIAGNOSIS — N400 Enlarged prostate without lower urinary tract symptoms: Secondary | ICD-10-CM | POA: Diagnosis present

## 2017-03-30 DIAGNOSIS — Z902 Acquired absence of lung [part of]: Secondary | ICD-10-CM

## 2017-03-30 DIAGNOSIS — Z8711 Personal history of peptic ulcer disease: Secondary | ICD-10-CM

## 2017-03-30 HISTORY — PX: LOBECTOMY: SHX5089

## 2017-03-30 HISTORY — PX: VIDEO ASSISTED THORACOSCOPY (VATS)/WEDGE RESECTION: SHX6174

## 2017-03-30 LAB — TYPE AND SCREEN
ABO/RH(D): A POS
Antibody Screen: NEGATIVE

## 2017-03-30 SURGERY — VIDEO ASSISTED THORACOSCOPY (VATS)/WEDGE RESECTION
Anesthesia: General | Site: Chest | Laterality: Right

## 2017-03-30 MED ORDER — ONDANSETRON HCL 4 MG/2ML IJ SOLN: INTRAMUSCULAR | Status: DC | PRN

## 2017-03-30 MED ORDER — DEXTROSE 5 % IV SOLN
1.5000 g | Freq: Two times a day (BID) | INTRAVENOUS | Status: AC
  Administered 2017-03-30 – 2017-03-31 (×2): 1.5 g via INTRAVENOUS
  Filled 2017-03-30 (×2): qty 1.5

## 2017-03-30 MED ORDER — LISINOPRIL 40 MG PO TABS
40.0000 mg | ORAL_TABLET | Freq: Every day | ORAL | Status: DC
  Administered 2017-03-31 – 2017-04-07 (×7): 40 mg via ORAL
  Filled 2017-03-30 (×5): qty 1
  Filled 2017-03-30 (×2): qty 2
  Filled 2017-03-30 (×3): qty 1

## 2017-03-30 MED ORDER — HYDROMORPHONE HCL 1 MG/ML IJ SOLN
0.2500 mg | INTRAMUSCULAR | Status: DC | PRN
  Administered 2017-03-30 (×2): 0.5 mg via INTRAVENOUS

## 2017-03-30 MED ORDER — PROMETHAZINE HCL 25 MG/ML IJ SOLN: 6.2500 mg | INTRAMUSCULAR | Status: DC | PRN

## 2017-03-30 MED ORDER — DIPHENHYDRAMINE HCL 50 MG/ML IJ SOLN: 12.5000 mg | Freq: Four times a day (QID) | INTRAMUSCULAR | Status: DC | PRN

## 2017-03-30 MED ORDER — DIPHENHYDRAMINE HCL 12.5 MG/5ML PO ELIX
12.5000 mg | ORAL_SOLUTION | Freq: Four times a day (QID) | ORAL | Status: DC | PRN
  Filled 2017-03-30: qty 5

## 2017-03-30 MED ORDER — PROPOFOL 10 MG/ML IV BOLUS
INTRAVENOUS | Status: AC
  Filled 2017-03-30: qty 20

## 2017-03-30 MED ORDER — TRAMADOL HCL 50 MG PO TABS
50.0000 mg | ORAL_TABLET | Freq: Four times a day (QID) | ORAL | Status: DC | PRN
  Administered 2017-04-05: 100 mg via ORAL
  Filled 2017-03-30: qty 2

## 2017-03-30 MED ORDER — ASPIRIN EC 81 MG PO TBEC
81.0000 mg | DELAYED_RELEASE_TABLET | Freq: Every day | ORAL | Status: DC
  Administered 2017-03-31 – 2017-04-07 (×8): 81 mg via ORAL
  Filled 2017-03-30 (×8): qty 1

## 2017-03-30 MED ORDER — FENTANYL CITRATE (PF) 250 MCG/5ML IJ SOLN
INTRAMUSCULAR | Status: AC
  Filled 2017-03-30: qty 5

## 2017-03-30 MED ORDER — ACETAMINOPHEN 160 MG/5ML PO SOLN
1000.0000 mg | Freq: Four times a day (QID) | ORAL | Status: AC
  Filled 2017-03-30: qty 40.6

## 2017-03-30 MED ORDER — DEXTROSE-NACL 5-0.9 % IV SOLN
INTRAVENOUS | Status: DC
  Administered 2017-03-30: 75 mL/h via INTRAVENOUS
  Administered 2017-03-31: 06:00:00 via INTRAVENOUS

## 2017-03-30 MED ORDER — LIDOCAINE 2% (20 MG/ML) 5 ML SYRINGE
INTRAMUSCULAR | Status: DC | PRN
  Administered 2017-03-30: 80 mg via INTRAVENOUS

## 2017-03-30 MED ORDER — ONDANSETRON HCL 4 MG/2ML IJ SOLN
4.0000 mg | Freq: Four times a day (QID) | INTRAMUSCULAR | Status: DC | PRN
Start: 2017-03-30 — End: 2017-04-07

## 2017-03-30 MED ORDER — LEVALBUTEROL HCL 0.63 MG/3ML IN NEBU
0.6300 mg | INHALATION_SOLUTION | Freq: Four times a day (QID) | RESPIRATORY_TRACT | Status: DC
  Administered 2017-03-30 – 2017-03-31 (×6): 0.63 mg via RESPIRATORY_TRACT
  Filled 2017-03-30 (×6): qty 3

## 2017-03-30 MED ORDER — HEMOSTATIC AGENTS (NO CHARGE) OPTIME
TOPICAL | Status: DC | PRN
  Administered 2017-03-30: 1

## 2017-03-30 MED ORDER — POTASSIUM CHLORIDE 10 MEQ/50ML IV SOLN: 10.0000 meq | Freq: Every day | INTRAVENOUS | Status: DC | PRN

## 2017-03-30 MED ORDER — BUPIVACAINE HCL (PF) 0.5 % IJ SOLN
INTRAMUSCULAR | Status: AC
  Filled 2017-03-30: qty 30

## 2017-03-30 MED ORDER — HYDROMORPHONE 1 MG/ML IV SOLN
INTRAVENOUS | Status: AC
  Filled 2017-03-30: qty 25

## 2017-03-30 MED ORDER — ALFUZOSIN HCL ER 10 MG PO TB24
10.0000 mg | ORAL_TABLET | Freq: Every day | ORAL | Status: DC
  Administered 2017-03-30 – 2017-04-06 (×8): 10 mg via ORAL
  Filled 2017-03-30 (×8): qty 1

## 2017-03-30 MED ORDER — ROCURONIUM BROMIDE 10 MG/ML (PF) SYRINGE
PREFILLED_SYRINGE | INTRAVENOUS | Status: AC
  Filled 2017-03-30: qty 10

## 2017-03-30 MED ORDER — LIDOCAINE 2% (20 MG/ML) 5 ML SYRINGE
INTRAMUSCULAR | Status: AC
  Filled 2017-03-30: qty 5

## 2017-03-30 MED ORDER — CLOPIDOGREL BISULFATE 75 MG PO TABS
75.0000 mg | ORAL_TABLET | Freq: Every day | ORAL | Status: DC
  Administered 2017-03-31 – 2017-04-07 (×8): 75 mg via ORAL
  Filled 2017-03-30 (×8): qty 1

## 2017-03-30 MED ORDER — ENOXAPARIN SODIUM 40 MG/0.4ML ~~LOC~~ SOLN: 40.0000 mg | Freq: Two times a day (BID) | SUBCUTANEOUS | Status: DC

## 2017-03-30 MED ORDER — DEXTROSE 5 % IV SOLN
1.5000 g | INTRAVENOUS | Status: AC
  Administered 2017-03-30: 1.5 g via INTRAVENOUS
  Filled 2017-03-30: qty 1.5

## 2017-03-30 MED ORDER — NALOXONE HCL 0.4 MG/ML IJ SOLN: 0.4000 mg | INTRAMUSCULAR | Status: DC | PRN

## 2017-03-30 MED ORDER — ACETAMINOPHEN 500 MG PO TABS
1000.0000 mg | ORAL_TABLET | Freq: Four times a day (QID) | ORAL | Status: AC
  Administered 2017-03-30 – 2017-04-04 (×16): 1000 mg via ORAL
  Filled 2017-03-30 (×15): qty 2

## 2017-03-30 MED ORDER — HYDROMORPHONE HCL 1 MG/ML IJ SOLN
INTRAMUSCULAR | Status: AC
  Filled 2017-03-30: qty 1

## 2017-03-30 MED ORDER — ROCURONIUM BROMIDE 100 MG/10ML IV SOLN
INTRAVENOUS | Status: DC | PRN
  Administered 2017-03-30: 10 mg via INTRAVENOUS
  Administered 2017-03-30: 20 mg via INTRAVENOUS
  Administered 2017-03-30: 50 mg via INTRAVENOUS
  Administered 2017-03-30 (×2): 20 mg via INTRAVENOUS

## 2017-03-30 MED ORDER — ORAL CARE MOUTH RINSE: 15.0000 mL | Freq: Two times a day (BID) | OROMUCOSAL | Status: DC

## 2017-03-30 MED ORDER — LEVOTHYROXINE SODIUM 75 MCG PO TABS
75.0000 ug | ORAL_TABLET | Freq: Every day | ORAL | Status: DC
  Administered 2017-03-31 – 2017-04-07 (×8): 75 ug via ORAL
  Filled 2017-03-30 (×8): qty 1

## 2017-03-30 MED ORDER — BISACODYL 5 MG PO TBEC
10.0000 mg | DELAYED_RELEASE_TABLET | Freq: Every day | ORAL | Status: DC
  Administered 2017-03-30 – 2017-04-01 (×3): 10 mg via ORAL
  Filled 2017-03-30 (×3): qty 2

## 2017-03-30 MED ORDER — BUPIVACAINE ON-Q PAIN PUMP (FOR ORDER SET NO CHG)
INJECTION | Status: AC
  Filled 2017-03-30: qty 1

## 2017-03-30 MED ORDER — METOCLOPRAMIDE HCL 5 MG/ML IJ SOLN
10.0000 mg | Freq: Four times a day (QID) | INTRAMUSCULAR | Status: AC
  Administered 2017-03-30 – 2017-03-31 (×4): 10 mg via INTRAVENOUS
  Filled 2017-03-30 (×4): qty 2

## 2017-03-30 MED ORDER — LACTATED RINGERS IV SOLN
INTRAVENOUS | Status: DC
  Administered 2017-03-30 (×2): via INTRAVENOUS

## 2017-03-30 MED ORDER — PROPOFOL 10 MG/ML IV BOLUS
INTRAVENOUS | Status: DC | PRN
Start: 2017-03-30 — End: 2017-03-30
  Administered 2017-03-30: 30 mg via INTRAVENOUS
  Administered 2017-03-30: 170 mg via INTRAVENOUS

## 2017-03-30 MED ORDER — HYDROMORPHONE 1 MG/ML IV SOLN
INTRAVENOUS | Status: DC
  Administered 2017-03-30: 0.2 mg via INTRAVENOUS
  Administered 2017-03-30: 2.8 mg via INTRAVENOUS
  Administered 2017-03-30: 0.8 mg via INTRAVENOUS
  Administered 2017-03-31: 0.4 mg via INTRAVENOUS
  Administered 2017-03-31: 2.6 mg via INTRAVENOUS
  Administered 2017-03-31: 3.2 mg via INTRAVENOUS
  Administered 2017-03-31: 2.2 mg via INTRAVENOUS
  Administered 2017-03-31: 1.2 mg via INTRAVENOUS
  Administered 2017-03-31: 1.8 mg via INTRAVENOUS
  Administered 2017-04-01: 2 mg via INTRAVENOUS
  Administered 2017-04-01: 0.8 mg via INTRAVENOUS
  Administered 2017-04-01: 0 mg via INTRAVENOUS
  Administered 2017-04-01: 0.8 mg via INTRAVENOUS
  Administered 2017-04-02: 10:00:00 via INTRAVENOUS
  Administered 2017-04-02: 0.6 mg via INTRAVENOUS
  Administered 2017-04-02: 0.4 mg via INTRAVENOUS
  Administered 2017-04-03: 0 mg via INTRAVENOUS
  Administered 2017-04-03 (×2): 0.4 mg via INTRAVENOUS
  Administered 2017-04-03: 0.6 mg via INTRAVENOUS
  Administered 2017-04-03 – 2017-04-04 (×3): 0.2 mg via INTRAVENOUS
  Filled 2017-03-30: qty 25

## 2017-03-30 MED ORDER — SODIUM CHLORIDE 0.9% FLUSH: 9.0000 mL | INTRAVENOUS | Status: DC | PRN

## 2017-03-30 MED ORDER — DEXTROSE 5 % IV SOLN
INTRAVENOUS | Status: DC | PRN
  Administered 2017-03-30: 25 ug/min via INTRAVENOUS

## 2017-03-30 MED ORDER — DEXAMETHASONE SODIUM PHOSPHATE 4 MG/ML IJ SOLN
INTRAMUSCULAR | Status: DC | PRN
  Administered 2017-03-30: 10 mg via INTRAVENOUS

## 2017-03-30 MED ORDER — FENTANYL CITRATE (PF) 100 MCG/2ML IJ SOLN
INTRAMUSCULAR | Status: DC | PRN
  Administered 2017-03-30 (×10): 50 ug via INTRAVENOUS

## 2017-03-30 MED ORDER — 0.9 % SODIUM CHLORIDE (POUR BTL) OPTIME
TOPICAL | Status: DC | PRN
  Administered 2017-03-30: 2000 mL

## 2017-03-30 MED ORDER — BUPIVACAINE 0.5 % ON-Q PUMP SINGLE CATH 400 ML
400.0000 mL | INJECTION | Status: DC
  Filled 2017-03-30: qty 400

## 2017-03-30 MED ORDER — SUGAMMADEX SODIUM 200 MG/2ML IV SOLN
INTRAVENOUS | Status: DC | PRN
  Administered 2017-03-30: 150 mg via INTRAVENOUS

## 2017-03-30 MED ORDER — CHLORHEXIDINE GLUCONATE 0.12 % MT SOLN
15.0000 mL | Freq: Two times a day (BID) | OROMUCOSAL | Status: DC
  Administered 2017-03-30 – 2017-04-01 (×3): 15 mL via OROMUCOSAL
  Filled 2017-03-30 (×2): qty 15

## 2017-03-30 MED ORDER — METOPROLOL TARTRATE 12.5 MG HALF TABLET
12.5000 mg | ORAL_TABLET | Freq: Two times a day (BID) | ORAL | Status: DC
  Administered 2017-03-30 – 2017-04-05 (×12): 12.5 mg via ORAL
  Filled 2017-03-30 (×13): qty 1

## 2017-03-30 MED ORDER — PANTOPRAZOLE SODIUM 40 MG PO TBEC
40.0000 mg | DELAYED_RELEASE_TABLET | Freq: Two times a day (BID) | ORAL | Status: DC
  Administered 2017-03-30 – 2017-04-07 (×16): 40 mg via ORAL
  Filled 2017-03-30 (×16): qty 1

## 2017-03-30 MED ORDER — ATORVASTATIN CALCIUM 40 MG PO TABS
40.0000 mg | ORAL_TABLET | Freq: Every day | ORAL | Status: DC
  Administered 2017-03-30 – 2017-04-07 (×9): 40 mg via ORAL
  Filled 2017-03-30 (×9): qty 1

## 2017-03-30 MED ORDER — SENNOSIDES-DOCUSATE SODIUM 8.6-50 MG PO TABS
1.0000 | ORAL_TABLET | Freq: Every day | ORAL | Status: DC
  Administered 2017-03-30 – 2017-03-31 (×2): 1 via ORAL
  Filled 2017-03-30 (×2): qty 1

## 2017-03-30 MED ORDER — AMLODIPINE BESYLATE 5 MG PO TABS
5.0000 mg | ORAL_TABLET | Freq: Every day | ORAL | Status: DC
Start: 2017-03-31 — End: 2017-04-07
  Administered 2017-03-31 – 2017-04-07 (×7): 5 mg via ORAL
  Filled 2017-03-30 (×8): qty 1

## 2017-03-30 MED ORDER — BUPIVACAINE 0.5 % ON-Q PUMP SINGLE CATH 400 ML
INJECTION | Status: AC | PRN
  Administered 2017-03-30: 400 mL

## 2017-03-30 MED ORDER — BUPIVACAINE HCL (PF) 0.5 % IJ SOLN
INTRAMUSCULAR | Status: DC | PRN
Start: 2017-03-30 — End: 2017-03-30
  Administered 2017-03-30: 30 mL

## 2017-03-30 MED ORDER — OXYCODONE HCL 5 MG PO TABS
5.0000 mg | ORAL_TABLET | ORAL | Status: DC | PRN
  Administered 2017-04-04: 5 mg via ORAL
  Administered 2017-04-06: 10 mg via ORAL
  Administered 2017-04-06 (×2): 5 mg via ORAL
  Administered 2017-04-07: 10 mg via ORAL
  Filled 2017-03-30: qty 2
  Filled 2017-03-30 (×4): qty 1
  Filled 2017-03-30: qty 2

## 2017-03-30 MED ORDER — ONDANSETRON HCL 4 MG/2ML IJ SOLN: 4.0000 mg | Freq: Four times a day (QID) | INTRAMUSCULAR | Status: DC | PRN

## 2017-03-30 SURGICAL SUPPLY — 96 items
ADH SKN CLS APL DERMABOND .7 (GAUZE/BANDAGES/DRESSINGS) ×1
APL SKNCLS STERI-STRIP NONHPOA (GAUZE/BANDAGES/DRESSINGS) ×1
APPLICATOR COTTON TIP 6IN STRL (MISCELLANEOUS) ×4 IMPLANT
BENZOIN TINCTURE PRP APPL 2/3 (GAUZE/BANDAGES/DRESSINGS) ×3 IMPLANT
CANISTER SUCT 3000ML PPV (MISCELLANEOUS) ×6 IMPLANT
CATH KIT ON Q 5IN SLV (PAIN MANAGEMENT) IMPLANT
CATH KIT ON-Q SILVERSOAK 5 (CATHETERS) IMPLANT
CATH KIT ON-Q SILVERSOAK 5IN (CATHETERS) ×3 IMPLANT
CATH THORACIC 28FR (CATHETERS) IMPLANT
CATH THORACIC 36FR (CATHETERS) IMPLANT
CATH THORACIC 36FR RT ANG (CATHETERS) IMPLANT
CLIP TI MEDIUM 6 (CLIP) ×4 IMPLANT
CLIP VESOCCLUDE MED 6/CT (CLIP) ×3 IMPLANT
CONN ST 1/4X3/8  BEN (MISCELLANEOUS)
CONN ST 1/4X3/8 BEN (MISCELLANEOUS) IMPLANT
CONN Y 3/8X3/8X3/8  BEN (MISCELLANEOUS)
CONN Y 3/8X3/8X3/8 BEN (MISCELLANEOUS) IMPLANT
CONT SPEC 4OZ CLIKSEAL STRL BL (MISCELLANEOUS) ×24 IMPLANT
COVER SURGICAL LIGHT HANDLE (MISCELLANEOUS) ×3 IMPLANT
DERMABOND ADVANCED (GAUZE/BANDAGES/DRESSINGS) ×2
DERMABOND ADVANCED .7 DNX12 (GAUZE/BANDAGES/DRESSINGS) ×1 IMPLANT
DRAIN CHANNEL 28F RND 3/8 FF (WOUND CARE) ×2 IMPLANT
DRAIN CHANNEL 32F RND 10.7 FF (WOUND CARE) IMPLANT
DRAPE LAPAROSCOPIC ABDOMINAL (DRAPES) ×3 IMPLANT
DRAPE WARM FLUID 44X44 (DRAPE) ×3 IMPLANT
ELECT BLADE 6.5 EXT (BLADE) ×3 IMPLANT
ELECT REM PT RETURN 9FT ADLT (ELECTROSURGICAL) ×3
ELECTRODE REM PT RTRN 9FT ADLT (ELECTROSURGICAL) ×1 IMPLANT
GAUZE SPONGE 4X4 12PLY STRL (GAUZE/BANDAGES/DRESSINGS) IMPLANT
GAUZE SPONGE 4X4 12PLY STRL LF (GAUZE/BANDAGES/DRESSINGS) ×2 IMPLANT
GLOVE SURG SIGNA 7.5 PF LTX (GLOVE) ×6 IMPLANT
GOWN STRL REUS W/ TWL LRG LVL3 (GOWN DISPOSABLE) ×2 IMPLANT
GOWN STRL REUS W/ TWL XL LVL3 (GOWN DISPOSABLE) ×2 IMPLANT
GOWN STRL REUS W/TWL LRG LVL3 (GOWN DISPOSABLE) ×6
GOWN STRL REUS W/TWL XL LVL3 (GOWN DISPOSABLE) ×6
HANDLE STAPLE ENDO GIA SHORT (STAPLE)
HEMOSTAT SURGICEL 2X14 (HEMOSTASIS) IMPLANT
KIT BASIN OR (CUSTOM PROCEDURE TRAY) ×3 IMPLANT
KIT ROOM TURNOVER OR (KITS) ×3 IMPLANT
KIT SUCTION CATH 14FR (SUCTIONS) ×3 IMPLANT
NS IRRIG 1000ML POUR BTL (IV SOLUTION) ×9 IMPLANT
PACK CHEST (CUSTOM PROCEDURE TRAY) ×3 IMPLANT
PAD ARMBOARD 7.5X6 YLW CONV (MISCELLANEOUS) ×6 IMPLANT
POUCH ENDO CATCH II 15MM (MISCELLANEOUS) ×2 IMPLANT
RELOAD STAPLE 35X2.5 WHT THIN (STAPLE) IMPLANT
RELOAD STAPLE 60 3.8 GOLD REG (STAPLE) IMPLANT
RELOAD STAPLE 60 4.1 GRN THCK (STAPLE) IMPLANT
RELOAD STAPLER GOLD 60MM (STAPLE) ×12 IMPLANT
RELOAD STAPLER GREEN 60MM (STAPLE) ×1 IMPLANT
SCISSORS ENDO CVD 5DCS (MISCELLANEOUS) IMPLANT
SEALANT PROGEL (MISCELLANEOUS) IMPLANT
SEALANT SURG COSEAL 4ML (VASCULAR PRODUCTS) IMPLANT
SEALANT SURG COSEAL 8ML (VASCULAR PRODUCTS) IMPLANT
SHEARS HARMONIC HDI 20CM (ELECTROSURGICAL) ×2 IMPLANT
SOLUTION ANTI FOG 6CC (MISCELLANEOUS) ×3 IMPLANT
SPECIMEN JAR MEDIUM (MISCELLANEOUS) ×3 IMPLANT
SPONGE INTESTINAL PEANUT (DISPOSABLE) ×18 IMPLANT
SPONGE TONSIL 1 RF SGL (DISPOSABLE) ×3 IMPLANT
STAPLE ECHEON FLEX 60 POW ENDO (STAPLE) ×2 IMPLANT
STAPLE RELOAD 2.5MM WHITE (STAPLE) ×18 IMPLANT
STAPLER ENDO GIA 12 SHRT THIN (STAPLE) IMPLANT
STAPLER ENDO GIA 12MM SHORT (STAPLE) IMPLANT
STAPLER RELOAD GOLD 60MM (STAPLE) ×36
STAPLER RELOAD GREEN 60MM (STAPLE) ×3
STAPLER VASCULAR ECHELON 35 (CUTTER) ×2 IMPLANT
SUT PROLENE 4 0 RB 1 (SUTURE) ×3
SUT PROLENE 4-0 RB1 .5 CRCL 36 (SUTURE) IMPLANT
SUT SILK  1 MH (SUTURE) ×4
SUT SILK 1 MH (SUTURE) ×2 IMPLANT
SUT SILK 1 TIES 10X30 (SUTURE) ×3 IMPLANT
SUT SILK 2 0 SH (SUTURE) IMPLANT
SUT SILK 2 0SH CR/8 30 (SUTURE) ×2 IMPLANT
SUT SILK 3 0 SH 30 (SUTURE) IMPLANT
SUT SILK 3 0SH CR/8 30 (SUTURE) ×1 IMPLANT
SUT VIC AB 0 CTX 27 (SUTURE) IMPLANT
SUT VIC AB 1 CTX 27 (SUTURE) ×3 IMPLANT
SUT VIC AB 2-0 CT1 27 (SUTURE)
SUT VIC AB 2-0 CT1 TAPERPNT 27 (SUTURE) IMPLANT
SUT VIC AB 2-0 CTX 36 (SUTURE) ×3 IMPLANT
SUT VIC AB 3-0 MH 27 (SUTURE) IMPLANT
SUT VIC AB 3-0 SH 27 (SUTURE)
SUT VIC AB 3-0 SH 27X BRD (SUTURE) IMPLANT
SUT VIC AB 3-0 X1 27 (SUTURE) ×5 IMPLANT
SUT VICRYL 0 UR6 27IN ABS (SUTURE) ×2 IMPLANT
SUT VICRYL 2 TP 1 (SUTURE) IMPLANT
SWAB CULTURE ESWAB REG 1ML (MISCELLANEOUS) IMPLANT
SYSTEM SAHARA CHEST DRAIN ATS (WOUND CARE) ×3 IMPLANT
TAPE CLOTH SURG 6X10 WHT LF (GAUZE/BANDAGES/DRESSINGS) ×2 IMPLANT
TIP APPLICATOR SPRAY EXTEND 16 (VASCULAR PRODUCTS) IMPLANT
TOWEL GREEN STERILE (TOWEL DISPOSABLE) ×3 IMPLANT
TOWEL GREEN STERILE FF (TOWEL DISPOSABLE) ×3 IMPLANT
TRAY FOLEY W/METER SILVER 16FR (SET/KITS/TRAYS/PACK) ×3 IMPLANT
TROCAR XCEL BLADELESS 5X75MML (TROCAR) ×3 IMPLANT
TROCAR XCEL NON-BLD 5MMX100MML (ENDOMECHANICALS) IMPLANT
TUNNELER SHEATH ON-Q 11GX8 DSP (PAIN MANAGEMENT) ×2 IMPLANT
WATER STERILE IRR 1000ML POUR (IV SOLUTION) ×6 IMPLANT

## 2017-03-30 NOTE — Brief Op Note (Addendum)
03/30/2017  1:31 PM  PATIENT:  [name] [name]  76 y.o. 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  PROCEDURE:  Procedure(s):  VIDEO ASSISTED THORACOSCOPY -Wedge Resection Right Upper Lobe -Thoracoscopic Right Upper Lobectomy -Lymph Node Dissection -Placement of ON-Q local Anesthetic catheter  SURGEON:  Surgeon(s) and Role:    * Melrose Nakayama, MD - Primary  PHYSICIAN ASSISTANT: Ellwood Handler PA-C  ANESTHESIA:   general  EBL:  Total I/O In: -  Out: 630 [Urine:430; Blood:200]  BLOOD ADMINISTERED:none  DRAINS: 28 Straigth Chest Tube   LOCAL MEDICATIONS USED:  MARCAINE     SPECIMEN:  Source of Specimen:  Right Upper Lobe Wedge, Right Upper Lobe, Lymph Nodes  DISPOSITION OF SPECIMEN:  PATHOLOGY  COUNTS:  YES  TOURNIQUET:  * No tourniquets in log *  DICTATION: .Dragon Dictation  PLAN OF CARE: Admit to inpatient   PATIENT DISPOSITION:  ICU - extubated and stable.   Delay start of Pharmacological VTE agent (>24hrs) due to surgical blood loss or risk of bleeding: yes  FINDINGS- RUL nodule with invagination of visceral pleura. Frozen= squamous cell carcinoma. Bronchial margin negative for tumor

## 2017-03-30 NOTE — Anesthesia Procedure Notes (Signed)
Central Venous Catheter Insertion Performed by: Duane Boston, anesthesiologist Start/End10/01/2017 8:38 AM, 03/30/2017 8:48 AM Patient location: Pre-op. Preanesthetic checklist: patient identified, IV checked, site marked, risks and benefits discussed, surgical consent, monitors and equipment checked, pre-op evaluation, timeout performed and anesthesia consent Position: Trendelenburg Lidocaine 1% used for infiltration and patient sedated Hand hygiene performed , maximum sterile barriers used  and Seldinger technique used Catheter size: 8 Fr Total catheter length 16. Central line was placed.Double lumen Procedure performed without using ultrasound guided technique. Attempts: 1 Following insertion, dressing applied, line sutured and Biopatch. Post procedure assessment: blood return through all ports, free fluid flow and no air  Patient tolerated the procedure well with no immediate complications.

## 2017-03-30 NOTE — Plan of Care (Signed)
Problem: Education: Goal: Knowledge of [facility] Education information/materials will improve Outcome: Progressing Discussed PCA pump with pt and pt verbalized understanding.   Problem: Skin Integrity: Goal: Risk for impaired skin integrity will decrease Outcome: Progressing Pt being repositioned frequently and dangled on side of bed multiple times post surgery.   Problem: Nutrition: Goal: Adequate nutrition will be maintained Outcome: Progressing Pt diet being advanced as tolerated.

## 2017-03-30 NOTE — Progress Notes (Signed)
      [address]       [place] [address]             [phone]      Some pain  BP 124/75   Pulse 98   Temp 98 F (36.7 C)   Resp 11   SpO2 94%    Intake/Output Summary (Last 24 hours) at 03/30/17 1756 Last data filed at 03/30/17 1700  Gross per 24 hour  Intake             1275 ml  Output              685 ml  Net              590 ml    Small air leak  Looks good early postop  Remo Lipps C. Roxan Hockey, MD [facility] [phone]

## 2017-03-30 NOTE — Anesthesia Procedure Notes (Signed)
Arterial Line Insertion Start/End10/01/2017 8:30 AM, 03/30/2017 8:35 AM Performed by: Bryson Corona, CRNA  Patient location: Pre-op. Preanesthetic checklist: patient identified, IV checked, site marked, risks and benefits discussed, surgical consent, monitors and equipment checked, pre-op evaluation, timeout performed and anesthesia consent Lidocaine 1% used for infiltration Left, radial was placed Catheter size: 20 G Hand hygiene performed , maximum sterile barriers used  and Seldinger technique used  Attempts: 1 Procedure performed without using ultrasound guided technique. Post procedure assessment: normal  Patient tolerated the procedure well with no immediate complications.

## 2017-03-30 NOTE — Interval H&P Note (Signed)
History and Physical Interval Note: [name] decided to proceed with right VATS, wedge, possible lobectomy  03/30/2017 9:30 AM  [name] [name]  has presented today for surgery, with the diagnosis of RUL LUNG MASS  The various methods of treatment have been discussed with the patient and family. After consideration of risks, benefits and other options for treatment, the patient has consented to  Procedure(s): VIDEO ASSISTED THORACOSCOPY (VATS)/WEDGE RESECTION (Right) possible LOBECTOMY (Right) as a surgical intervention .  The patient's history has been reviewed, patient examined, no change in status, stable for surgery.  I have reviewed the patient's chart and labs.  Questions were answered to the patient's satisfaction.     Melrose Nakayama

## 2017-03-30 NOTE — Anesthesia Postprocedure Evaluation (Signed)
Anesthesia Post Note  Patient: CANTRELL LAROUCHE  Procedure(s) Performed: VIDEO ASSISTED THORACOSCOPY (VATS)/WEDGE RESECTION (Right Chest) RIGHT UPPER LOBECTOMY LUNG (Right Chest)     Patient location during evaluation: PACU Anesthesia Type: General Level of consciousness: sedated Pain management: pain level controlled Vital Signs Assessment: post-procedure vital signs reviewed and stable Respiratory status: spontaneous breathing and respiratory function stable Cardiovascular status: stable Postop Assessment: no apparent nausea or vomiting Anesthetic complications: no    Last Vitals:  Vitals:   03/30/17 1453 03/30/17 1500  BP: 131/81   Pulse: 88 97  Resp: 14 19  Temp:    SpO2: 96% 97%    Last Pain:  Vitals:   03/30/17 1438  TempSrc:   PainSc: 6                  Savayah Waltrip DANIEL

## 2017-03-30 NOTE — Transfer of Care (Signed)
Immediate Anesthesia Transfer of Care Note  Patient: [name] [name]  Procedure(s) Performed: VIDEO ASSISTED THORACOSCOPY (VATS)/WEDGE RESECTION (Right Chest) RIGHT UPPER LOBECTOMY LUNG (Right Chest)  Patient Location: PACU  Anesthesia Type:General  Level of Consciousness: awake, alert  and oriented  Airway & Oxygen Therapy: Patient Spontanous Breathing and Patient connected to nasal cannula oxygen  Post-op Assessment: Report given to RN and Post -op Vital signs reviewed and stable  Post vital signs: Reviewed and stable  Last Vitals:  Vitals:   03/30/17 0713  BP: (!) 149/92  Pulse: 89  Resp: 18  Temp: 36.6 C  SpO2: 99%    Last Pain:  Vitals:   03/30/17 0713  TempSrc: Oral      Patients Stated Pain Goal: 3 (17/00/17 4944)  Complications: No apparent anesthesia complications

## 2017-03-30 NOTE — H&P (View-Only) (Signed)
[address]       [place] [address]             [phone]       HPI: [name] returns today to discuss possible surgical resection for his right upper lobe lung mass.  Mr. Schwabe is a 76 year old gentleman with a remote history of tobacco abuse, severe aortic stenosis, COPD, chronic atrial fibrillation, hypertension, hyperlipidemia, abdominal aortic aneurysm, and bladder cancer. He presented in April with shortness of breath. His chest x-ray showed a right upper lobe lung nodule CT confirmed a 17 mm spiculated nodule in the right upper lobe. PET CT showed the nodule was hypermetabolic with an SUV of 6.7 there were some borderline mediastinal nodes but no definite pathologic adenopathy.  I saw him back in July. He did have a resectable mass in his pulmonary function testing was adequate, but he had critical aortic stenosis that had to be dealt with first. Cardiac catheterization revealed moderate LAD disease 60% that was managed medically. On 02/24/2017 he underwent TAVR. Preoperatively he was found to be anemic secondary to multiple gastric ulcerations. He was on Eliquis at that time. Postoperatively he was discharged on aspirin and Plavix.  He noted an immediate improvement in his shortness of breath after the TAVR procedure. He is not having any chest pain, pressure, or tightness. He quit smoking 11 years ago. Zubrod Score: At the time of surgery this patient's most appropriate activity status/level should be described as: []     0    Normal activity, no symptoms [x]     1    Restricted in physical strenuous activity but ambulatory, able to do out light work []     2    Ambulatory and capable of self care, unable to do work activities, up and about >50 % of waking hours                              []     3    Only limited self care, in bed greater than 50% of waking hours []     4    Completely disabled, no self care, confined to bed or chair []     5     Moribund  Past Medical History:  Diagnosis Date  . AAA (abdominal aortic aneurysm) ([place])   . Aortic stenosis, severe    a. 02/2017: s/p TAVR with Edwards Sapien 3 THV (size 26 mm, model # 9600TFX, serial # [id number])  . Arthritis   . Atherosclerotic peripheral vascular disease ([place])   . Atrial fibrillation, persistent ([place])    a. not a candidate for [place] given hx of GI bleed  . Bilateral carotid artery stenosis   . COPD (chronic obstructive pulmonary disease) ([place])   . Hyperlipidemia   . Hypertension   . Hypothyroidism   . Skin cancer   . Vitamin D deficiency    Past Surgical History:  Procedure Laterality Date  . ABDOMINAL AORTIC ANEURYSM REPAIR  08-09-09  . BACK SURGERY    . ESOPHAGOGASTRODUODENOSCOPY N/A 02/21/2017   Procedure: ESOPHAGOGASTRODUODENOSCOPY (EGD);  Surgeon: Jerene Bears, MD;  Location[facility];  Service: Gastroenterology;  Laterality: N/A;  . HYDROCELE EXCISION / REPAIR    . SKIN CANCER EXCISION    . SPINE SURGERY    . TEE WITHOUT CARDIOVERSION N/A 02/24/2017   Procedure: TRANSESOPHAGEAL ECHOCARDIOGRAM (TEE);  Surgeon: Sherren Mocha, MD;  Location: [facility];  Service: Open Heart Surgery;  Laterality: N/A;  . TRANSCATHETER AORTIC VALVE REPLACEMENT, TRANSFEMORAL N/A 02/24/2017   Procedure: TRANSCATHETER AORTIC VALVE REPLACEMENT, TRANSFEMORAL using a 23mm Edwards Sapien 3 Aortic Valve;  Surgeon: Sherren Mocha, MD;  Location: [facility];  Service: Open Heart Surgery;  Laterality: N/A;  . TRANSURETHRAL RESECTION OF BLADDER TUMOR N/A 06/06/2016   Procedure: TRANSURETHRAL RESECTION OF BLADDER TUMOR (TURBT);  Surgeon: Franchot Gallo, MD;  Location: [facility];  Service: Urology;  Laterality: N/A;  . TRANSURETHRAL RESECTION OF BLADDER TUMOR N/A 07/22/2016   Procedure: TRANSURETHRAL RESECTION OF BLADDER TUMOR (TURBT);  Surgeon: Franchot Gallo, MD;  Location: [facility];  Service: Urology;  Laterality: N/A;   Current Outpatient Prescriptions  Medication Sig Dispense Refill  .  alfuzosin (UROXATRAL) 10 MG 24 hr tablet Take 10 mg by mouth at bedtime.     Marland Kitchen amLODipine (NORVASC) 5 MG tablet Take 1 tablet (5 mg total) by mouth daily. 30 tablet 5  . aspirin EC 81 MG tablet Take 1 tablet (81 mg total) by mouth daily. 90 tablet 3  . atorvastatin (LIPITOR) 40 MG tablet Take 1 tablet (40 mg total) by mouth daily. 30 tablet 5  . cholecalciferol (VITAMIN D) 1000 UNITS tablet Take 1 tablet (1,000 Units total) by mouth daily. 30 tablet 6  . clopidogrel (PLAVIX) 75 MG tablet Take 1 tablet (75 mg total) by mouth daily with breakfast. 30 tablet 6  . ferrous gluconate (FERGON) 324 MG tablet Take 1 tablet (324 mg total) by mouth 2 (two) times daily with a meal. 60 tablet 6  . levothyroxine (SYNTHROID, LEVOTHROID) 75 MCG tablet Take 1 tablet (75 mcg total) by mouth daily. 30 tablet 8  . lisinopril (PRINIVIL,ZESTRIL) 40 MG tablet Take 1 tablet (40 mg total) by mouth daily. 30 tablet 5  . metoprolol tartrate (LOPRESSOR) 25 MG tablet Take 0.5 tablets (12.5 mg total) by mouth 2 (two) times daily. 30 tablet 6  . Multiple Vitamin (ONE-A-DAY MENS PO) Take 1 tablet by mouth daily.      . Omega-3 Fatty Acids (FISH OIL) 1200 MG CAPS Take 2,400 mg by mouth daily.     . pantoprazole (PROTONIX) 40 MG tablet Take 1 tablet (40 mg total) by mouth 2 (two) times daily. 60 tablet 6   No current facility-administered medications for this visit.     Physical Exam BP 114/70 (BP Location: Right Arm, Patient Position: Sitting, Cuff Size: Normal)   Pulse 98   Resp 16   Ht 5\' 10"  (1.778 m)   Wt 164 lb 3.2 oz (74.5 kg)   SpO2 96% Comment: ON RA  BMI 23.86 kg/m  76 year old man in no acute distress Well-developed and well-nourished Alert and oriented 3 with no focal deficits Lungs clear with no rales or wheezing Cardiac irregularly irregular with no audible murmur No cervical or supraclavicular adenopathy Abdomen soft nontender Extremities without clubbing cyanosis or edema  Diagnostic Tests: I  personally reviewed his CT and PET CT as well as his CT angiogram from August. There was definite interval growth of the right upper lobe lung nodule between a CT in May and August. There also was an increase in the solid component of the nodule as well. Mediastinal lymph nodes appeared unchanged.  FVC 3.23 (76%) FEV1 1.98 (64%) FEV1 2.11 (69%) post bronchodilator DLCO 20.32 (61%)  Impression: [name] is a 76 year old gentleman with a past history of tobacco abuse who has a 2.3 cm mixed solid/sub-solid nodule in the right upper lobe. Nodules first  found on a CT back in May. His workup was delayed by need for correction of severe symptomatic aortic stenosis which was completed about 3 weeks ago. In the interim he had a repeat CT in August which showed an increase in both the size and solid component of the right upper lobe nodule. This is highly suspicious for a new primary bronchogenic carcinoma. 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  I had a long discussion with Mr. Cuevas and his wife regarding treatment options for the lung cancer. The primary option in the interim this behind addressing his aortic valve was surgical resection. Secondary option is radiation. He came in today and said that he wanted to pursue radiation. I think this was primarily his wife's idea she is concerned about him undergoing surgery and he seemed rather indifferent between the 2. I explained to them that lobectomy and stereotactic radiation or not equivalent treatments although they are alternatives. Surgery has a higher risk up from an obvious discomfort, but a much better chance of cure than stereotactic radiation.  I described the proposed operation of right VATS for wedge resection followed by a lobectomy with Mr. and Mrs. Keats. They understand the general nature of the procedure including the need for general anesthesia, incisions be used, use of a drainage tube postoperatively, the expected hospital stay, and the  overall recovery, and the possibility of cure. I reviewed the indications, risks, benefits, and alternatives. They understand the risk include, but are not limited to death, MI, stroke, DVT, PE, bleeding, possible need for transfusion, infection, prolonged air leak, cardiac arrhythmias, pain, as well as the possibility of other unforeseeable complications.  After discussion Mr. Mrs. Gibeault would like additional time to talk with each other before making a decision is how he would like to proceed. The meantime I'm going go ahead and see if I can get him scheduled for a CT-guided needle biopsy and radiation oncology consult in case she decides to pursue that.  Plan: Mr. Haliburton will call tomorrow and let me know how he would like to proceed.  Melrose Nakayama, MD [facility] [phone]

## 2017-03-30 NOTE — Anesthesia Preprocedure Evaluation (Addendum)
Anesthesia Evaluation   Patient awake    Reviewed: Allergy & Precautions, NPO status , Patient's Chart, lab work & pertinent test results  History of Anesthesia Complications Negative for: history of anesthetic complications  Airway Mallampati: I  TM Distance: >3 FB Neck ROM: Full    Dental  (+) Edentulous Lower, Edentulous Upper, Dental Advisory Given   Pulmonary COPD, former smoker,    Pulmonary exam normal        Cardiovascular hypertension, Pt. on medications and Pt. on home beta blockers + Peripheral Vascular Disease  Normal cardiovascular exam+ dysrhythmias Atrial Fibrillation + Valvular Problems/Murmurs      Neuro/Psych negative neurological ROS  negative psych ROS   GI/Hepatic Neg liver ROS, PUD,   Endo/Other  Hypothyroidism   Renal/GU Renal InsufficiencyRenal disease     Musculoskeletal   Abdominal   Peds  Hematology   Anesthesia Other Findings   Reproductive/Obstetrics                            Anesthesia Physical Anesthesia Plan  ASA: III  Anesthesia Plan: General   Post-op Pain Management:    Induction: Intravenous  PONV Risk Score and Plan: 2 and Ondansetron, Dexamethasone and Treatment may vary due to age or medical condition  Airway Management Planned: Double Lumen EBT  Additional Equipment:   Intra-op Plan:   Post-operative Plan: Extubation in OR  Informed Consent: I have reviewed the patients History and Physical, chart, labs and discussed the procedure including the risks, benefits and alternatives for the proposed anesthesia with the patient or authorized representative who has indicated his/her understanding and acceptance.   Dental advisory given  Plan Discussed with: CRNA and Anesthesiologist  Anesthesia Plan Comments:        Anesthesia Quick Evaluation

## 2017-03-30 NOTE — Anesthesia Procedure Notes (Signed)
Procedure Name: Intubation Date/Time: 03/30/2017 9:53 AM Performed by: Bryson Corona Pre-anesthesia Checklist: Patient identified, Emergency Drugs available, Suction available and Patient being monitored Patient Re-evaluated:Patient Re-evaluated prior to induction Oxygen Delivery Method: Circle system utilized Preoxygenation: Pre-oxygenation with 100% oxygen Induction Type: IV induction Ventilation: Oral airway inserted - appropriate to patient size Laryngoscope Size: 3 and Mac Grade View: Grade I Tube type: Oral Endobronchial tube: Double lumen EBT and 39 Fr Number of attempts: 1 Airway Equipment and Method: Patient positioned with wedge pillow and Oral airway Placement Confirmation: ETT inserted through vocal cords under direct vision,  positive ETCO2 and breath sounds checked- equal and bilateral Tube secured with: Tape Dental Injury: Teeth and Oropharynx as per pre-operative assessment

## 2017-03-31 ENCOUNTER — Inpatient Hospital Stay (HOSPITAL_COMMUNITY): Payer: Medicare Other

## 2017-03-31 ENCOUNTER — Other Ambulatory Visit: Payer: Self-pay

## 2017-03-31 ENCOUNTER — Encounter (HOSPITAL_COMMUNITY): Payer: Self-pay | Admitting: Thoracic Surgery (Cardiothoracic Vascular Surgery)

## 2017-03-31 LAB — BASIC METABOLIC PANEL
Anion gap: 11 (ref 5–15)
BUN: 19 mg/dL (ref 6–20)
CHLORIDE: 103 mmol/L (ref 101–111)
CO2: 23 mmol/L (ref 22–32)
Calcium: 9 mg/dL (ref 8.9–10.3)
Creatinine, Ser: 1.08 mg/dL (ref 0.61–1.24)
GFR calc Af Amer: >60 mL/min (ref >60)
GFR calc non Af Amer: >60 mL/min (ref >60)
Glucose, Bld: 174 mg/dL — ABNORMAL HIGH (ref 65–99)
POTASSIUM: 4.4 mmol/L (ref 3.5–5.1)
SODIUM: 137 mmol/L (ref 135–145)

## 2017-03-31 LAB — POCT I-STAT 3, ART BLOOD GAS (G3+)
ACID-BASE DEFICIT: 1 mmol/L (ref 0.0–2.0)
Bicarbonate: 24.1 mmol/L (ref 20.0–28.0)
O2 Saturation: 98 %
PH ART: 7.376 (ref 7.350–7.450)
PO2 ART: 108 mmHg (ref 83.0–108.0)
Patient temperature: 98.3
TCO2: 25 mmol/L (ref 22–32)
pCO2 arterial: 41.1 mmHg (ref 32.0–48.0)

## 2017-03-31 LAB — CBC
HCT: 33.1 % — ABNORMAL LOW (ref 39.0–52.0)
Hemoglobin: 10.3 g/dL — ABNORMAL LOW (ref 13.0–17.0)
MCH: 27.1 pg (ref 26.0–34.0)
MCHC: 31.1 g/dL (ref 30.0–36.0)
MCV: 87.1 fL (ref 78.0–100.0)
Platelets: 120 10*3/uL — ABNORMAL LOW (ref 150–400)
RBC: 3.8 MIL/uL — ABNORMAL LOW (ref 4.22–5.81)
RDW: 19.9 % — ABNORMAL HIGH (ref 11.5–15.5)
WBC: 11.9 10*3/uL — ABNORMAL HIGH (ref 4.0–10.5)

## 2017-03-31 LAB — GLUCOSE, CAPILLARY: Glucose-Capillary: 174 mg/dL — ABNORMAL HIGH (ref 65–99)

## 2017-03-31 MED ORDER — SODIUM CHLORIDE 0.9% FLUSH
10.0000 mL | Freq: Two times a day (BID) | INTRAVENOUS | Status: DC
  Administered 2017-03-31 (×2): 10 mL

## 2017-03-31 MED ORDER — CHLORHEXIDINE GLUCONATE CLOTH 2 % EX PADS
6.0000 | MEDICATED_PAD | Freq: Every day | CUTANEOUS | Status: DC
  Administered 2017-03-31: 6 via TOPICAL

## 2017-03-31 MED ORDER — LEVALBUTEROL HCL 0.63 MG/3ML IN NEBU
0.6300 mg | INHALATION_SOLUTION | Freq: Three times a day (TID) | RESPIRATORY_TRACT | Status: DC
  Administered 2017-04-01 – 2017-04-02 (×5): 0.63 mg via RESPIRATORY_TRACT
  Filled 2017-03-31 (×6): qty 3

## 2017-03-31 MED ORDER — ENOXAPARIN SODIUM 40 MG/0.4ML ~~LOC~~ SOLN
40.0000 mg | SUBCUTANEOUS | Status: DC
  Administered 2017-03-31: 40 mg via SUBCUTANEOUS
  Filled 2017-03-31 (×2): qty 0.4

## 2017-03-31 MED ORDER — SODIUM CHLORIDE 0.9% FLUSH: 10.0000 mL | INTRAVENOUS | Status: DC | PRN

## 2017-03-31 MED ORDER — ORAL CARE MOUTH RINSE
15.0000 mL | Freq: Two times a day (BID) | OROMUCOSAL | Status: DC
  Administered 2017-03-31: 15 mL via OROMUCOSAL

## 2017-03-31 NOTE — Progress Notes (Signed)
1 Day Post-Op Procedure(s) (LRB): VIDEO ASSISTED THORACOSCOPY (VATS)/WEDGE RESECTION (Right) RIGHT UPPER LOBECTOMY LUNG (Right) Subjective: Some incisional pain, controlled with PCA Denies nausea  Objective: Vital signs in last 24 hours: Temp:  [97.5 F (36.4 C)-98.5 F (36.9 C)] 97.9 F (36.6 C) (10/09 0700) Pulse Rate:  [76-111] 83 (10/09 0800) Cardiac Rhythm: Atrial fibrillation (10/09 0800) Resp:  [1-24] 14 (10/09 0800) BP: (87-142)/(50-104) 126/73 (10/09 0800) SpO2:  [85 %-99 %] 95 % (10/09 0800) Arterial Line BP: (112-181)/(44-85) 135/58 (10/09 0800) Weight:  [163 lb 12.8 oz (74.3 kg)] 163 lb 12.8 oz (74.3 kg) (10/09 0330)  Hemodynamic parameters for last 24 hours:    Intake/Output from previous day: 10/08 0701 - 10/09 0700 In: 2690.2 [P.O.:290; I.V.:2325.2; IV Piggyback:50] Out: 1240 [Urine:735; Blood:200; Chest Tube:305] Intake/Output this shift: Total I/O In: 225 [P.O.:150; I.V.:75] Out: 94 [Urine:35; Chest Tube:50]  General appearance: alert, cooperative and no distress Neurologic: intact Heart: irregularly irregular rhythm Lungs: faint wheezes on right Abdomen: normal findings: soft, non-tender small air leak  Lab Results:  Recent Labs  03/31/17 0329  WBC 11.9*  HGB 10.3*  HCT 33.1*  PLT 120*   BMET:  Recent Labs  03/31/17 0329  NA 137  K 4.4  CL 103  CO2 23  GLUCOSE 174*  BUN 19  CREATININE 1.08  CALCIUM 9.0    PT/INR: No results for input(s): LABPROT, INR in the last 72 hours. ABG    Component Value Date/Time   PHART 7.376 03/31/2017 0346   HCO3 24.1 03/31/2017 0346   TCO2 25 03/31/2017 0346   ACIDBASEDEF 1.0 03/31/2017 0346   O2SAT 98.0 03/31/2017 0346   CBG (last 3)  No results for input(s): GLUCAP in the last 72 hours.  Assessment/Plan: S/P Procedure(s) (LRB): VIDEO ASSISTED THORACOSCOPY (VATS)/WEDGE RESECTION (Right) RIGHT UPPER LOBECTOMY LUNG (Right) Plan for transfer to step-down: see transfer orders  CV- in atrial  fibrillation- chronic  Rate is controlled, not a candidate for NOAC due to hx of Gi bleed  ASA, plavix for TAVR  RESP- s/p lobectomy  Continue IS, nebs keep CT to suction today  RENAL- lytes and creatinine OK  ENDO- glucose elevated this Am - on D5  DVT prophylaxis- SCD + enoxparin  Mobilize    LOS: 1 day    Melrose Nakayama 03/31/2017

## 2017-03-31 NOTE — Op Note (Signed)
NAME:  [name] [name], [name] [name]            ACCOUNT NO.:  [id number]  MEDICAL RECORD NO.:  [id number]  LOCATION:                                 FACILITY:  PHYSICIAN:  Revonda Standard. Roxan Hockey, M.D.DATE OF BIRTH:  [birth year]  DATE OF PROCEDURE:  03/30/2017 DATE OF DISCHARGE:                              OPERATIVE REPORT   PREOPERATIVE DIAGNOSIS:  Right upper lobe lung nodule.  POSTOPERATIVE DIAGNOSIS:  Squamous cell carcinoma, right upper lobe, clinical stage IA.  PROCEDURE:   Right video-assisted thoracoscopy, Wedge resection of right upper lobe nodule, Thoracoscopic right upper lobectomy, Mediastinal lymph node dissection,  On-Q local anesthetic catheter placement.  SURGEON:  Revonda Standard. Roxan Hockey, MD.  ASSISTANTEllwood Handler, PA-C.  ANESTHESIA:  General.  FINDINGS:  2-cm mass on lateral aspect of the right upper lobe with some invagination of the visceral pleura.  Frozen section revealed squamous cell carcinoma. Enlarged but otherwise benign-appearing lymph nodes. Fibrotic reaction around nodes in vicinity of right upper lobe bronchus and pulmonary artery.  Bronchial margin negative for tumor.  CLINICAL NOTE:  [name] is a 76 year old gentleman with a remote history of tobacco abuse.  He presented back in April with shortness of breath and chest x-ray showed a right upper lobe lung nodule.  CT showed a spiculated nodule in the right upper lobe.  This was hypermetabolic on PET-CT.  He also was found to have critical aortic stenosis and underwent transcatheter aortic valve replacement on February 24, 2017. He now has recovered from that and was advised to undergo right VATS for wedge resection and possible right upper lobectomy.  He was also offered the option of biopsy and stereotactic radiation.  After thinking over his options, he wished to proceed with surgical resection.  OPERATIVE NOTE:  [name] was brought to the preoperative holding area on March 30, 2017.   Anesthesia placed a central line and arterial blood pressure monitoring line.  He was taken to the operating room, anesthetized, and intubated with a double-lumen endotracheal tube. Intravenous antibiotics were administered.  A Foley catheter was placed. Sequential compression devices were placed on the calves for DVT prophylaxis.  He was placed in a left lateral decubitus position, and the right chest was prepped and draped in usual sterile fashion.  Single lung ventilation of the left lung was initiated and was tolerated well throughout the procedure.  After performing a time-out, an incision was made in the seventh interspace in the midaxillary line.  A 5-mm port was inserted into the chest, and the thoracoscope was advanced into the chest.  There was good isolation of the right lung.  The fissures were relatively complete.  A 5-cm working incision was made in the fourth interspace anterolaterally. No rib spreading was performed during the procedure.  The upper lobe was inspected.  There was some invagination of the visceral pleura, and there was a palpable nodule in this area.  A wedge resection was performed with sequential firings of an endoscopic stapler.  An Echelon 60-mm stapler with gold cartridges was used.  The specimen was placed into an endoscopic retrieval bag, removed and sent for frozen section.  An On-Q local anesthetic catheter was tunneled through a  separate incision posteriorly.  It was tunneled into a subpleural location and primed with 5 mL of 0.5% Marcaine.  It was secured to the skin with a 3- 0 silk suture.  The inferior ligament was divided.  No level 9 node was identified.  The pleural reflection was divided at the hilum posteriorly.  Multiple level 7 nodes were encountered.  These were dissected out and removed and sent for pathology as separate specimens as were all nodes that were encountered during the dissection.  The level 7 nodes were  relatively enlarged, but otherwise benign appearing.  At this point, the frozen section returned showing squamous cell carcinoma, and decision was made to proceed with lobectomy as discussed with the patient preoperatively. The confluence of the major and minor fissure was relatively complete, and the pulmonary artery was identified at this site.  There were adhesions in the minor fissure between the upper and middle lobe.  Some of these adhesions were taken down with cautery, but ultimately the remainder of the minor fissure was completed with sequential firings of the Echelon stapler again using the gold cartridges.  The pleural reflection was divided at the hilum anteriorly.  The tissue surrounding the superior pulmonary vein branches was relatively dense and fibrous, but it was carefully dissected off.  The most superior branch of the superior pulmonary vein came off at an unusual angle and to better expose the remainder of the superior pulmonary vein, this branch was divided separately with the endoscopic vascular stapler.  The middle lobe branches were identified and preserved.  The remainder of the superior pulmonary vein was dissected out, encircled and divided with a vascular stapler. A separate port incision anterior to the first port incision was made for placement of the vascular stapler when dividing the superior pulmonary vein. Dissection then was carried more superiorly.  The main pulmonary artery was identified.  There was a cord of dense fibrous tissue between the main pulmonary artery and the common trunk of the apical and anterior pulmonary artery branches to the upper lobe.  This was a tedious dissection, but this was carefully dissected off.  Once that was done, the common trunk was easily encircled and divided with a vascular stapler.  The posterior ascending branch was relatively small. This was also divided with the vascular stapler.  There was a very thin portion  of the major fissure between the superior segment and the upper lobe that was not complete.  This was completed at this point with the Echelon stapler, and the final firing of the stapler was done to complete the minor fissure leaving only the bronchus.  There were nodes around the bronchus, which were dissected off and sent as separate specimens.  An Echelon stapler with a green cartridge was placed across the right upper lobe bronchus at its origin and closed.  A test inflation showed good aeration of the lower and middle lobes.  The stapler was fired transecting the right upper lobe bronchus.  The right upper lobe then was placed into an endoscopic retrieval bag removed and sent for frozen section of the bronchial margin, which subsequently returned with no tumor seen.  The azygos vein was retracted superiorly and the right paratracheal lymph nodes were dissected out.  There were multiple nodes in this location, all of which were relatively benign in appearance.  These were sent as a single specimen.  After removing the nodes, there was some bleeding from the azygos vein.  The azygos vein was divided  with the vascular stapler, which resulted in good hemostasis.  The chest was copiously irrigated with warm saline.  A test inflation to 30 cm of water revealed leakage from a small tear in the superior segment of the lower lobe.  This area was resected with the stapler.  After confirming correct orientation of the middle lobe, it was stapled to the lower lobe using the Echelon stapler.  A repeat test inflation showed no air leak.  A 28-French chest tube was placed through the anterior port incision.   The chest tube was secured to the skin with #1 silk suture.  The lower and middle lobes were reinflated.  The original port incision was closed with a #1 Vicryl fascial suture and a subcuticular closure for the skin.  The working incision was closed with a #1 Vicryl fascial suture, 2-0 Vicryl  subcutaneous suture, and 3-0 Vicryl subcuticular suture.  All sponge, needle, and instrument counts were correct at the end of the procedure.  The patient was taken from the operating room to the postanesthetic care unit extubated and in good condition.     Revonda Standard Roxan Hockey, M.D.     SCH/MEDQ  D:  03/30/2017  T:  03/31/2017  Job:  [id number]

## 2017-03-31 NOTE — Care Management Note (Signed)
Case Management Note Marvetta Gibbons RN, BSN Unit 4E-Case Manager-- [place] coverage [phone]  Patient Details  Name: [name] [name] MRN: [id number] Date of Birth: [birth year]  Subjective/Objective:  Pt admitted s/p VIDEO ASSISTED THORACOSCOPY (VATS)/WEDGE RESECTION (Right) RIGHT UPPER LOBECTOMY LUNG (Right)                  Action/Plan: PTA pt lived at home with spouse- CM to follow for d/c needs  Expected Discharge Date:                  Expected Discharge Plan:     In-House Referral:     Discharge planning Services  CM Consult  Post Acute Care Choice:    Choice offered to:     DME Arranged:    DME Agency:     HH Arranged:    HH Agency:     Status of Service:  In process, will continue to follow  If discussed at Long Length of Stay Meetings, dates discussed:    Discharge Disposition:   Additional Comments:  Dawayne Patricia, RN 03/31/2017, 10:02 AM

## 2017-03-31 NOTE — Progress Notes (Signed)
Patient ID: [name] [name], male   DOB: 1940-09-29, 76 y.o.   MRN: [id number] T[facility] Evening Rounds  Hemodynamically stable in atrial fibrillation with controlled rate 80's.  sats 94% on 1L [place].   Up in chair, comfortable.

## 2017-03-31 NOTE — Progress Notes (Signed)
Patient ambulated 370 feet in the hall. Patient tolerated very well. Back to chair on 1 LNC. Will wean as tolerated. Will continue to monitor.  Lucius Conn, RN

## 2017-04-01 ENCOUNTER — Inpatient Hospital Stay (HOSPITAL_COMMUNITY): Payer: Medicare Other

## 2017-04-01 LAB — CBC
HEMATOCRIT: 29.2 % — AB (ref 39.0–52.0)
HEMOGLOBIN: 9.2 g/dL — AB (ref 13.0–17.0)
MCH: 27.5 pg (ref 26.0–34.0)
MCHC: 31.5 g/dL (ref 30.0–36.0)
MCV: 87.2 fL (ref 78.0–100.0)
Platelets: 96 10*3/uL — ABNORMAL LOW (ref 150–400)
RBC: 3.35 MIL/uL — AB (ref 4.22–5.81)
RDW: 20.4 % — ABNORMAL HIGH (ref 11.5–15.5)
WBC: 10.6 10*3/uL — AB (ref 4.0–10.5)

## 2017-04-01 LAB — GLUCOSE, CAPILLARY: GLUCOSE-CAPILLARY: 113 mg/dL — AB (ref 65–99)

## 2017-04-01 LAB — COMPREHENSIVE METABOLIC PANEL
ALBUMIN: 3.4 g/dL — AB (ref 3.5–5.0)
ALK PHOS: 57 U/L (ref 38–126)
ALT: 18 U/L (ref 17–63)
AST: 41 U/L (ref 15–41)
Anion gap: 10 (ref 5–15)
BILIRUBIN TOTAL: 0.7 mg/dL (ref 0.3–1.2)
BUN: 30 mg/dL — ABNORMAL HIGH (ref 6–20)
CALCIUM: 8.9 mg/dL (ref 8.9–10.3)
CO2: 26 mmol/L (ref 22–32)
Chloride: 98 mmol/L — ABNORMAL LOW (ref 101–111)
Creatinine, Ser: 1.23 mg/dL (ref 0.61–1.24)
GFR calc non Af Amer: 55 mL/min — ABNORMAL LOW (ref >60)
GLUCOSE: 110 mg/dL — AB (ref 65–99)
Potassium: 4.5 mmol/L (ref 3.5–5.1)
Sodium: 134 mmol/L — ABNORMAL LOW (ref 135–145)
TOTAL PROTEIN: 6.7 g/dL (ref 6.5–8.1)

## 2017-04-01 NOTE — Progress Notes (Signed)
2 Days Post-Op Procedure(s) (LRB): VIDEO ASSISTED THORACOSCOPY (VATS)/WEDGE RESECTION (Right) RIGHT UPPER LOBECTOMY LUNG (Right) Subjective: Didn't sleep well. Ambulated around unit multiple times yesterday and over night  Objective: Vital signs in last 24 hours: Temp:  [97.6 F (36.4 C)-98.5 F (36.9 C)] 97.6 F (36.4 C) (10/10 0745) Pulse Rate:  [38-112] 92 (10/10 0600) Cardiac Rhythm: Atrial fibrillation (10/10 0400) Resp:  [5-24] 11 (10/10 0740) BP: (75-146)/(54-112) 111/71 (10/10 0600) SpO2:  [87 %-100 %] 100 % (10/10 0822) Arterial Line BP: (114-180)/(50-69) 149/59 (10/09 1200)  Hemodynamic parameters for last 24 hours:    Intake/Output from previous day: 10/09 0701 - 10/10 0700 In: 1478.6 [P.O.:1150; I.V.:328.6] Out: 640 [Urine:460; Chest Tube:180] Intake/Output this shift: Total I/O In: -  Out: 100 [Urine:100]  General appearance: alert, cooperative and no distress Neurologic: intact Heart: irregularly irregular rhythm Lungs: rhonchi on right Abdomen: normal findings: soft, non-tender + air leak, mild SQ emphysema  Lab Results:  Recent Labs  03/31/17 0329 04/01/17 0350  WBC 11.9* 10.6*  HGB 10.3* 9.2*  HCT 33.1* 29.2*  PLT 120* 96*   BMET:  Recent Labs  03/31/17 0329 04/01/17 0350  NA 137 134*  K 4.4 4.5  CL 103 98*  CO2 23 26  GLUCOSE 174* 110*  BUN 19 30*  CREATININE 1.08 1.23  CALCIUM 9.0 8.9    PT/INR: No results for input(s): LABPROT, INR in the last 72 hours. ABG    Component Value Date/Time   PHART 7.376 03/31/2017 0346   HCO3 24.1 03/31/2017 0346   TCO2 25 03/31/2017 0346   ACIDBASEDEF 1.0 03/31/2017 0346   O2SAT 98.0 03/31/2017 0346   CBG (last 3)   Recent Labs  03/30/17 2355 04/01/17 0735  GLUCAP 174* 113*    Assessment/Plan: S/P Procedure(s) (LRB): VIDEO ASSISTED THORACOSCOPY (VATS)/WEDGE RESECTION (Right) RIGHT UPPER LOBECTOMY LUNG (Right) -CV- stable  RESP- still has an air leak. No pneumo on CXR but does  have new SQ emphysema  Keep CT to suction  Continue nebs/ IS  RENAL- creatinine and lytes OK  ENDO- CBG better this AM  Thrombocytopenia- PLT down from 120 to 96 K- stop lovenox, check HIT  SCD + ambulation for DVT prophylaxis  Anemia secondary to ABL- mild, follow   LOS: 2 days    [name] [name] 04/01/2017

## 2017-04-01 NOTE — Progress Notes (Signed)
Pt transferred to rm 4e25 at this time.  Pt has no c/o pain or s/s of any acute distress.  Report given to Clarene Critchley, [place].

## 2017-04-02 ENCOUNTER — Inpatient Hospital Stay (HOSPITAL_COMMUNITY): Payer: Medicare Other

## 2017-04-02 LAB — CBC
HCT: 28.4 % — ABNORMAL LOW (ref 39.0–52.0)
HEMOGLOBIN: 8.9 g/dL — AB (ref 13.0–17.0)
MCH: 27.3 pg (ref 26.0–34.0)
MCHC: 31.3 g/dL (ref 30.0–36.0)
MCV: 87.1 fL (ref 78.0–100.0)
Platelets: 92 10*3/uL — ABNORMAL LOW (ref 150–400)
RBC: 3.26 MIL/uL — AB (ref 4.22–5.81)
RDW: 21 % — ABNORMAL HIGH (ref 11.5–15.5)
WBC: 6.8 10*3/uL (ref 4.0–10.5)

## 2017-04-02 LAB — HEPARIN INDUCED PLATELET AB (HIT ANTIBODY): Heparin Induced Plt Ab: 0.319 OD (ref 0.000–0.400)

## 2017-04-02 MED ORDER — BISACODYL 5 MG PO TBEC: 10.0000 mg | DELAYED_RELEASE_TABLET | Freq: Every day | ORAL | Status: DC | PRN

## 2017-04-02 MED ORDER — SENNOSIDES-DOCUSATE SODIUM 8.6-50 MG PO TABS: 1.0000 | ORAL_TABLET | Freq: Every evening | ORAL | Status: DC | PRN

## 2017-04-02 NOTE — Progress Notes (Addendum)
      [address]       [place] [address]             [phone]      3 Days Post-Op Procedure(s) (LRB): VIDEO ASSISTED THORACOSCOPY (VATS)/WEDGE RESECTION (Right) RIGHT UPPER LOBECTOMY LUNG (Right)   Subjective:  [name] [name] has some right sided chest discomfort.  He denies shortness of breath.  He states that he had used the bathroom six times after use of laxative yesterday.    Objective: Vital signs in last 24 hours: Temp:  [97.7 F (36.5 C)-98.6 F (37 C)] 98.2 F (36.8 C) (10/11 0446) Pulse Rate:  [77-113] 104 (10/11 0000) Cardiac Rhythm: Atrial fibrillation (10/11 0026) Resp:  [10-20] 12 (10/11 0400) BP: (84-157)/(55-72) 125/66 (10/11 0000) SpO2:  [94 %-100 %] 98 % (10/11 0400) Weight:  [165 lb 12.6 oz (75.2 kg)] 165 lb 12.6 oz (75.2 kg) (10/11 0446)  Intake/Output from previous day: 10/10 0701 - 10/11 0700 In: 1250.4 [P.O.:1080; I.V.:170.4] Out: 700 [Urine:700]  General appearance: alert, cooperative and no distress Heart: regular rate and rhythm Lungs: clear to auscultation bilaterally Abdomen: soft, non-tender; bowel sounds normal; no masses,  no organomegaly Extremities: extremities normal, atraumatic, no cyanosis or edema Wound: clean, + serous drainage from ON-Q and chest tube site  Lab Results:  Recent Labs  04/01/17 0350 04/02/17 0224  WBC 10.6* 6.8  HGB 9.2* 8.9*  HCT 29.2* 28.4*  PLT 96* 92*   BMET:  Recent Labs  03/31/17 0329 04/01/17 0350  NA 137 134*  K 4.4 4.5  CL 103 98*  CO2 23 26  GLUCOSE 174* 110*  BUN 19 30*  CREATININE 1.08 1.23  CALCIUM 9.0 8.9    PT/INR: No results for input(s): LABPROT, INR in the last 72 hours. ABG    Component Value Date/Time   PHART 7.376 03/31/2017 0346   HCO3 24.1 03/31/2017 0346   TCO2 25 03/31/2017 0346   ACIDBASEDEF 1.0 03/31/2017 0346   O2SAT 98.0 03/31/2017 0346   CBG (last 3)   Recent Labs  03/30/17 2355 04/01/17 0735  GLUCAP 174* 113*     Assessment/Plan: S/P Procedure(s) (LRB): VIDEO ASSISTED THORACOSCOPY (VATS)/WEDGE RESECTION (Right) RIGHT UPPER LOBECTOMY LUNG (Right)  1. CV- NSR, continue Lopressor, Lisinopril, Norvasc, Plavix 2. Pulm- chest tube, + air leak on suction,  Worsening sub q air on CXR this morning small apical pneumothorax, patient is asymptomati... Leave in place, could possibly increase suction to 30, will defer to Dr. Roxan Hockey 3. Renal- creatinine has been stable 4. Thrombocytopenia down to 92- off Lovenox, HIT remains pending 5. Dispo- patient with worsening sub q air on CXR today, + air leak will leave on suction, continue to avoid Heparin agents, HIT is pending, hemodynamically stable  LOS: 3 days    [name], [name] 04/02/2017 Patient seen and examined, agree with above PATH- T1N0- stage IA- patient and wife informed Will leave CT as is for now  [place]. Roxan Hockey, MD [facility] [phone]

## 2017-04-03 ENCOUNTER — Inpatient Hospital Stay (HOSPITAL_COMMUNITY): Payer: Medicare Other

## 2017-04-03 ENCOUNTER — Encounter: Payer: Self-pay | Admitting: Thoracic Surgery (Cardiothoracic Vascular Surgery)

## 2017-04-03 MED ORDER — LEVALBUTEROL HCL 0.63 MG/3ML IN NEBU
0.6300 mg | INHALATION_SOLUTION | Freq: Two times a day (BID) | RESPIRATORY_TRACT | Status: DC
  Administered 2017-04-03: 0.63 mg via RESPIRATORY_TRACT
  Filled 2017-04-03 (×2): qty 3

## 2017-04-03 NOTE — Care Management Important Message (Signed)
Important Message  Patient Details  Name: [name] [name] MRN: [id number] Date of Birth: 01/26/1941   Medicare Important Message Given:  Yes    Ebony Yorio Abena 04/03/2017, 11:09 AM

## 2017-04-03 NOTE — Progress Notes (Addendum)
      [address]       [place] [address]             [phone]      4 Days Post-Op Procedure(s) (LRB): VIDEO ASSISTED THORACOSCOPY (VATS)/WEDGE RESECTION (Right) RIGHT UPPER LOBECTOMY LUNG (Right)   Subjective:  No new complaints.  States chest tube doesn't hurt as bad as did yesterday. Denies shortness of breath   Objective: Vital signs in last 24 hours: Temp:  [97.4 F (36.3 C)-99 F (37.2 C)] 98.2 F (36.8 C) (10/12 0746) Pulse Rate:  [71-104] 84 (10/12 0746) Cardiac Rhythm: Normal sinus rhythm (10/12 0352) Resp:  [10-21] 18 (10/12 0746) BP: (90-138)/(59-83) 134/64 (10/12 0746) SpO2:  [93 %-99 %] 98 % (10/12 0746) Weight:  [170 lb 8 oz (77.3 kg)] 170 lb 8 oz (77.3 kg) (10/12 0500)  Intake/Output from previous day: 10/11 0701 - 10/12 0700 In: 960 [P.O.:960] Out: 720 [Urine:300; Chest Tube:420]  General appearance: alert, cooperative and no distress Heart: regular rate and rhythm Lungs: clear to auscultation bilaterally Abdomen: soft, non-tender; bowel sounds normal; no masses,  no organomegaly Extremities: extremities normal, atraumatic, no cyanosis or edema Wound: clean and dry  Lab Results:  Recent Labs  04/01/17 0350 04/02/17 0224  WBC 10.6* 6.8  HGB 9.2* 8.9*  HCT 29.2* 28.4*  PLT 96* 92*   BMET:  Recent Labs  04/01/17 0350  NA 134*  K 4.5  CL 98*  CO2 26  GLUCOSE 110*  BUN 30*  CREATININE 1.23  CALCIUM 8.9    PT/INR: No results for input(s): LABPROT, INR in the last 72 hours. ABG    Component Value Date/Time   PHART 7.376 03/31/2017 0346   HCO3 24.1 03/31/2017 0346   TCO2 25 03/31/2017 0346   ACIDBASEDEF 1.0 03/31/2017 0346   O2SAT 98.0 03/31/2017 0346   CBG (last 3)   Recent Labs  04/01/17 0735  GLUCAP 113*    Assessment/Plan: S/P Procedure(s) (LRB): VIDEO ASSISTED THORACOSCOPY (VATS)/WEDGE RESECTION (Right) RIGHT UPPER LOBECTOMY LUNG (Right)  1. CV- NSR, BP stable- continue Norvasc, Lisinopril,  Lopressor, Plavix 2. Pulm- continued air leak on suction, sub q air appears stable on exam... CXR hasn't been completed yesterday 3. Renal- creatinine was mildly elevated yesterday, will get repeat BMET in AM 4. Thrombocytopenia- Plt count dropped to 92 yesterday, not on Lovenox, HIT is pending.. Recheck CBC in AM 5.  Dispo- patient stable, repeat labs, CXR in AM... Chest tube will remain on suction today with continued air leak.. Will review CXR once completed to see if any adjustments need to be made   LOS: 4 days    BARRETT, ERIN 04/03/2017 Patient seen and examined, agree with above Still has an air leak although it looks a little better today  Remo Lipps C. Roxan Hockey, MD [facility] [phone]

## 2017-04-03 NOTE — Discharge Summary (Signed)
Physician Discharge Summary  Patient ID: [name] [name] MRN: [id number] DOB/AGE: Nov 02, 1940 76 y.o.  Admit date: 03/30/2017 Discharge date: 04/07/2017  Admission Diagnoses: Right upper lobe lung nodule  Active Diagnoses: 1. 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 Melena 3. Multiple gastric ulcers 4. Symptomatic anemia 5. Atrial fibrillation ([name] [name]) 6. Hyperlipidemia 7. Hypertension 8. Severe aortic stenosis 9. Acute kidney injury superimposed on chronic kidney disease ([name] [name]) 10. BPH (benign prostatic hyperplasia) 11. Acute diastolic heart failure ([name] [name]) 12. COPD (chronic obstructive pulmonary disease) ([name] [name]) 13. Vitamin D deficiency 14. Fatty liver 15. Gallbladder polyp 16. Hypothyroidism 17. S/P AAA repair 36. Carotid stenosis 19. BMI 26.0-26.9,adult   History of Present Illness:  [name] is a 76 yo white male with remote history of tobacco abuse, severe Aortic Stenosis, COPD, Chronic Atrial Fibrillation, Hypertension, Hyperlipidemia, AAA, and bladder cancer.  He presented with complaints of shortness of breath in April.  CXR at that time showed a right upper lobe nodule.  CT scan was obtained and confirmed the nodule measuring 17 mm spiculated nodule in the right upper lobe.  PET CT scan was obtained and was hypermetabolic with some borderline mediastinal nodes.  He was referred to Dr. Roxan [name] for evaluation in July at which time he was a candidate for surgical intervention, but during his preoperative workup he was found to have critical aortic stenosis.   He underwent cardiac catheterization which showed moderate LAD disease which was managed medically.  He underwent TAVR workup and ultimately underwent TAVR on 02/24/2017.  He did well post operatively and again followed up with Dr. Roxan [name] on 03/17/2017 at which time he was agreeable to proceed with VATS procedure.    Hospital Course:   [name] presented to [facility] on  03/30/2017.  He was taken to the operating room and underwent Right VATS with wedge resection of right upper lobe nodule, right upper lobectomy, mediastinal lymph node dissection, and placement of ON-Q anesthetic catheter.  He tolerated the procedure without difficulty, was extubated and taken to the SICU in stable condition.  During his stay in the SICU the patients chest tube exhibited an air leak.  He did not have a pneumothorax on CXR, but did have evidence of subcutaneous emphysema on CXR.  His chest tube remained on suction.  He developed thrombocytopenia and his Lovenox was discontinued.  Heparin induced platelet antibody was 0.319.  His arterial line and foley catheter were removed on POD #2.  He was ambulating independently around the SICU and was felt medically stable for transfer to the telemetry unit in stable condition on 04/01/2017.  The patient continued to make progress.  His chest tube continued to have evidence of air leak.  There was further extension of sub cutaneous emphysema with development of apical pneumothorax on POD #3.  His chest tube remained on suction.  Follow CXR showed stable appearance of pneumothorax and again worsening sub cutaneous emphysema.  His chest tube remained to suction for several days post op. Air leak eventually resolved. Chest tube was placed to water seal on 04/06/2017.  Chest tube was removed on 04/07/2017. Follow up chest x ray showed stable appearance of pneumothorax and no further extension of sub cutaneous emphysema.  He was felt medically stable for discharge home today.   Significant Diagnostic Studies: Final Pathology  Diagnosis 1. Lung, wedge biopsy/resection, RUL - SQUAMOUS CELL CARCINOMA, 1.6 CM. - TUMOR INVOLVES SUBPLEURAL CONNECTIVE TISSUE. - MARGINS NOT INVOLVED. 2. Lung, resection (segmental or  lobe), Right Upper Lobe - FINDINGS CONSISTENT WITH PREVIOUS WEDGE BIOPSY. - FOCAL INFLAMMATION WITH FIBROSIS AND PNEUMOCYTE HYPERPLASIA. - NO  RESIDUAL SQUAMOUS CELL CARCINOMA. 3. Lymph node, biopsy, Level 7 - ANTHRACOTIC LYMPH NODE. - NO TUMOR IDENTIFIED. 4. Lymph node, biopsy, Level 7 #2 - ANTHRACOTIC LYMPH NODE. - NO TUMOR IDENTIFIED. 5. Lymph node, biopsy, 11R - ANTHRACOTIC LYMPH NODE. - NO TUMOR IDENTIFIED. 6. Lymph node, biopsy, 11R #2 - ANTHRACOTIC LYMPH NODE. - NO TUMOR IDENTIFIED. 7. Lymph node, biopsy, 12R - ANTHRACOTIC LYMPH NODE. - NO TUMOR IDENTIFIED. 8. Lymph node, biopsy, 12R #2 - ANTHRACOTIC LYMPH NODE. - NO TUMOR IDENTIFIED. 9. Lymph node, biopsy, 12R #3 1 of 4 FINAL for TORELL, MINDER [phone]) Diagnosis(continued) - ANTHRACOTIC LYMPH NODE. - NO TUMOR IDENTIFIED. 10. Lymph node, biopsy, 4R - ANTHRACOTIC LYMPH NODE. - NO TUMOR IDENTIFIED.  Recent Imaging Studies: CLINICAL DATA:  Followup right pneumothorax. Status post right upper lobectomy for lung carcinoma  EXAM: PORTABLE CHEST 1 VIEW  COMPARISON:  04/05/2017  FINDINGS: Right-sided chest tube remains in place. A small approximately 15-20% right apical pneumothorax shows mild decrease in size. Changes of COPD again noted. Heart size is within normal limits. Prostatic aortic valve noted. Subcutaneous emphysema again seen throughout the chest wall soft tissues, right side greater than left.  IMPRESSION: Mild decrease in size of small right apical pneumothorax.   Electronically Signed   By: Earle Gell M.D.   On: 04/06/2017 07:31  Treatments: surgery:   Right video-assisted thoracoscopy, wedge resection of right upper lobe nodule, thoracoscopic right upper lobectomy, mediastinal lymph node dissection, On-Q local anesthetic catheter placement.  Disposition: 01-Home or Self Care   Discharge Medications:   Allergies as of 04/07/2017      Reactions   Lovenox [enoxaparin]    HIT panel ordered 10/10      Medication List    TAKE these medications   alfuzosin 10 MG 24 hr tablet Commonly known as:  UROXATRAL Take  10 mg by mouth at bedtime.   amLODipine 5 MG tablet Commonly known as:  NORVASC Take 1 tablet (5 mg total) by mouth daily.   aspirin EC 81 MG tablet Take 1 tablet (81 mg total) by mouth daily.   atorvastatin 40 MG tablet Commonly known as:  LIPITOR Take 1 tablet (40 mg total) by mouth daily.   cholecalciferol 1000 units tablet Commonly known as:  VITAMIN D Take 1 tablet (1,000 Units total) by mouth daily.   clopidogrel 75 MG tablet Commonly known as:  PLAVIX Take 1 tablet (75 mg total) by mouth daily with breakfast.   ferrous gluconate 324 MG tablet Commonly known as:  FERGON Take 1 tablet (324 mg total) by mouth 2 (two) times daily with a meal.   Fish Oil 1200 MG Caps Take 2,400 mg by mouth daily.   levothyroxine 75 MCG tablet Commonly known as:  SYNTHROID, LEVOTHROID Take 1 tablet (75 mcg total) by mouth daily.   lisinopril 40 MG tablet Commonly known as:  PRINIVIL,ZESTRIL Take 1 tablet (40 mg total) by mouth daily.   metoprolol tartrate 25 MG tablet Commonly known as:  LOPRESSOR Take 0.5 tablets (12.5 mg total) by mouth 2 (two) times daily.   ONE-A-DAY MENS PO Take 1 tablet by mouth daily.   oxyCODONE 5 MG immediate release tablet Commonly known as:  Oxy IR/ROXICODONE Take 1 tablet (5 mg total) by mouth every 4 (four) hours as needed for severe pain.   pantoprazole 40 MG tablet Commonly known  as:  PROTONIX Take 1 tablet (40 mg total) by mouth 2 (two) times daily.      Follow-up Information    Melrose Nakayama, MD Follow up on 04/21/2017.   Specialty:  Cardiothoracic Surgery Why:  Appointment is at 4:30, please get CXR at 4:00 at [facility] imaging located on first floor of our office building Contact information: [place] [address] [phone]           Signed: Ellwood Handler 04/07/2017, 1:05 PM

## 2017-04-03 NOTE — Discharge Instructions (Signed)
Video-Assisted Thoracic Surgery, Care After ° °This sheet gives you information about how to care for yourself after your procedure. Your health care provider may also give you more specific instructions. If you have problems or questions, contact your health care provider. °What can I expect after the procedure? °After the procedure, it is common to have: °· Some pain and soreness in your chest. °· Pain when breathing in (inhaling) and coughing. °· Constipation. °· Fatigue. °· Difficulty sleeping. ° °Follow these instructions at home: °Preventing pneumonia °· Take deep breaths or do breathing exercises as instructed by your health care provider. Doing this helps prevent lung infection (pneumonia). °· Cough frequently. Coughing may cause discomfort, but it is important to clear mucus (phlegm) and expand your lungs. If it hurts to cough, hold a pillow against your chest or place the palms of both hands on top of the incision (use splinting) when you cough. This may help relieve discomfort. °· If you were given an incentive spirometer, use it as directed. An incentive spirometer is a tool that measures how well you are filling your lungs with each breath. °· Participate in pulmonary rehabilitation as directed by your health care provider. This is a program that combines education, exercise, and support from a team of specialists. The goal is to help you heal and get back to your normal activities as soon as possible. °Medicines °· Take over-the-counter or prescription medicines only as told by your health care provider. °· If you have pain, take pain-relieving medicine before your pain becomes severe. This is important because if your pain is under control, you will be able to breathe and cough more comfortably. °· If you were prescribed an antibiotic medicine, take it as told by your health care provider. Do not stop taking the antibiotic even if you start to feel better. °Activity °· Ask your health care provider  what activities are safe for you. °· Avoid activities that use your chest muscles for at least 3-4 weeks. °· Do not lift anything that is heavier than 10 lb (4.5 kg), or the limit that your health care provider tells you, until he or she says that it is safe. °Incision care °· Follow instructions from your health care provider about how to take care of your incision(s). Make sure you: °? Wash your hands with soap and water before you change your bandage (dressing). If soap and water are not available, use hand sanitizer. °? Change your dressing as told by your health care provider. °? Leave stitches (sutures), skin glue, or adhesive strips in place. These skin closures may need to stay in place for 2 weeks or longer. If adhesive strip edges start to loosen and curl up, you may trim the loose edges. Do not remove adhesive strips completely unless your health care provider tells you to do that. °· Keep your dressing dry until it has been removed. °· Check your incision area every day for signs of infection. Check for: °? Redness, swelling, or pain. °? Fluid or blood. °? Warmth. °? Pus or a bad smell. °Bathing °· Do not take baths, swim, or use a hot tub until your health care provider approves. You may take showers. °· After your dressing has been removed, use soap and water to gently wash your incision area. Do not use anything else to clean your incision(s) unless your health care provider tells you to do this. °Driving °· Do not drive until your health care provider approves. °· Do not drive   or use heavy machinery while taking prescription pain medicine. °Eating and drinking °· Eat a healthy, balanced diet as instructed by your health care provider. A healthy diet includes plenty of fresh fruits and vegetables, whole grains, and low-fat (lean) proteins. °· Limit foods that are high in fat and processed sugars, such as fried and sweet foods. °· Drink enough fluid to keep your urine clear or pale yellow. °General  instructions °· To prevent or treat constipation while you are taking prescription pain medicine, your health care provider may recommend that you: °? Take over-the-counter or prescription medicines. °? Eat foods that are high in fiber, such as beans, fresh fruits and vegetables, and whole grains. °· Do not use any products that contain nicotine or tobacco, such as cigarettes and e-cigarettes. If you need help quitting, ask your health care provider. °· Avoid secondhand smoke. °· Wear compression stockings as told by your health care provider. These stockings help to prevent blood clots and reduce swelling in your legs. °· If you have a chest tube, care for it as instructed by your health care provider. Do not travel by airplane during the 2 weeks after your chest tube is removed, or until your health care provider says that this is safe. °· Keep all follow-up visits as told by your health care provider. This is important. °Contact a health care provider if: °· You have redness, swelling, or pain around an incision. °· You have fluid or blood coming from an incision. °· Your incision area feels warm to the touch. °· You have pus or a bad smell coming from an incision. °· You have a fever or chills. °· You have nausea or vomiting. °· You have pain that does not get better with medicine. °Get help right away if: °· You have chest pain. °· Your heart is fluttering or beating rapidly. °· You develop a rash. °· You have shortness of breath or trouble breathing. °· You are confused. °· You have trouble speaking. °· You feel weak, light-headed, or dizzy. °· You faint. °Summary °· To help prevent lung infection (pneumonia), take deep breaths or do breathing exercises as instructed by your health care provider. °· Cough frequently to clear mucus (phlegm) and expand your lungs. If it hurts to cough, hold a pillow against your chest or place the palms of both hands on top of the incision (use splinting) when you cough. °· If  you have pain, take pain-relieving medicine before your pain becomes severe. This is important because if your pain is under control, you will be able to breathe and cough more comfortably. °· Ask your health care provider what activities are safe for you. °This information is not intended to replace advice given to you by your health care provider. Make sure you discuss any questions you have with your health care provider. °Document Released: 10/04/2012 Document Revised: 05/19/2016 Document Reviewed: 05/19/2016 °Elsevier Interactive Patient Education © 2017 [employer] ° °

## 2017-04-04 ENCOUNTER — Inpatient Hospital Stay (HOSPITAL_COMMUNITY): Payer: Medicare Other

## 2017-04-04 LAB — CBC
HCT: 29.9 % — ABNORMAL LOW (ref 39.0–52.0)
Hemoglobin: 9.3 g/dL — ABNORMAL LOW (ref 13.0–17.0)
MCH: 27 pg (ref 26.0–34.0)
MCHC: 31.1 g/dL (ref 30.0–36.0)
MCV: 86.7 fL (ref 78.0–100.0)
PLATELETS: 137 10*3/uL — AB (ref 150–400)
RBC: 3.45 MIL/uL — ABNORMAL LOW (ref 4.22–5.81)
RDW: 20.7 % — AB (ref 11.5–15.5)
WBC: 6.2 10*3/uL (ref 4.0–10.5)

## 2017-04-04 LAB — BASIC METABOLIC PANEL
Anion gap: 10 (ref 5–15)
BUN: 19 mg/dL (ref 6–20)
CO2: 23 mmol/L (ref 22–32)
CREATININE: 0.89 mg/dL (ref 0.61–1.24)
Calcium: 8.7 mg/dL — ABNORMAL LOW (ref 8.9–10.3)
Chloride: 104 mmol/L (ref 101–111)
GFR calc Af Amer: >60 mL/min (ref >60)
GLUCOSE: 120 mg/dL — AB (ref 65–99)
POTASSIUM: 3.9 mmol/L (ref 3.5–5.1)
SODIUM: 137 mmol/L (ref 135–145)

## 2017-04-04 MED ORDER — LEVALBUTEROL HCL 0.63 MG/3ML IN NEBU: 0.6300 mg | INHALATION_SOLUTION | Freq: Four times a day (QID) | RESPIRATORY_TRACT | Status: DC | PRN

## 2017-04-04 NOTE — Progress Notes (Signed)
Dilaudid PCA discontinued. 21 mL dilaudid wasted in sink and pyxis; witnessed by Lilia Argue., RN.    Grant Fontana BSN, RN

## 2017-04-04 NOTE — Progress Notes (Addendum)
      [address]       [place] [address]             [phone]      5 Days Post-Op Procedure(s) (LRB): VIDEO ASSISTED THORACOSCOPY (VATS)/WEDGE RESECTION (Right) RIGHT UPPER LOBECTOMY LUNG (Right) Subjective: Has no pain. Feels good. Looking forward to his walk today.   Objective: Vital signs in last 24 hours: Temp:  [98.2 F (36.8 C)-98.7 F (37.1 C)] 98.7 F (37.1 C) (10/13 0800) Pulse Rate:  [73-101] 101 (10/13 0800) Cardiac Rhythm: Atrial fibrillation (10/12 1925) Resp:  [13-20] 17 (10/13 0800) BP: (85-138)/(46-103) 138/103 (10/13 0800) SpO2:  [94 %-98 %] 97 % (10/13 0800) Weight:  [163 lb 6.4 oz (74.1 kg)] 163 lb 6.4 oz (74.1 kg) (10/13 0354)     Intake/Output from previous day: 10/12 0701 - 10/13 0700 In: 600 [P.O.:600] Out: 750 [Urine:200; Stool:300; Chest Tube:250] Intake/Output this shift: No intake/output data recorded.  General appearance: alert, cooperative and no distress Heart: regular rate and rhythm, S1, S2 normal, no murmur, click, rub or gallop Lungs: clear to auscultation bilaterally Abdomen: soft, non-tender; bowel sounds normal; no masses,  no organomegaly Extremities: extremities normal, atraumatic, no cyanosis or edema Wound: clean and dry  Lab Results:  Recent Labs  04/02/17 0224 04/04/17 0344  WBC 6.8 6.2  HGB 8.9* 9.3*  HCT 28.4* 29.9*  PLT 92* 137*   BMET:  Recent Labs  04/04/17 0344  NA 137  K 3.9  CL 104  CO2 23  GLUCOSE 120*  BUN 19  CREATININE 0.89  CALCIUM 8.7*    PT/INR: No results for input(s): LABPROT, INR in the last 72 hours. ABG    Component Value Date/Time   PHART 7.376 03/31/2017 0346   HCO3 24.1 03/31/2017 0346   TCO2 25 03/31/2017 0346   ACIDBASEDEF 1.0 03/31/2017 0346   O2SAT 98.0 03/31/2017 0346   CBG (last 3)  No results for input(s): GLUCAP in the last 72 hours.  Assessment/Plan: S/P Procedure(s) (LRB): VIDEO ASSISTED THORACOSCOPY (VATS)/WEDGE RESECTION (Right) RIGHT  UPPER LOBECTOMY LUNG (Right)  1. CV- NSR, BP stable- continue Norvasc, Lisinopril, Lopressor, Plavix 2. Pulm- continued 1+ air leak on suction, sub q air slightly decreased on CXR, no edema or pleural fluid. Tiny residual apical pneumo on the right < 5%. 228ml/24 hours documented out of chest tube.  3. Renal- creatinine down to 0.89, electrolytes okay.  4. Thrombocytopenia- Plt count increased to 137k, HIT panel pending. H and H stable 5.  Dispo- patient stable, labs stable today. Will continue chest tube for now. CXR appears to be improving. Discontinued PCA per patient's request.     LOS: 5 days    [name] [name] 04/04/2017 Patient seen and examined, agree with above Air leak is still present but significantly smaller Will leave on suction today Consider water seal tomorrow  Remo Lipps C. Roxan Hockey, MD [facility] [phone]

## 2017-04-05 ENCOUNTER — Inpatient Hospital Stay (HOSPITAL_COMMUNITY): Payer: Medicare Other

## 2017-04-05 MED ORDER — METOPROLOL TARTRATE 5 MG/5ML IV SOLN: 2.5000 mg | INTRAVENOUS | Status: DC | PRN

## 2017-04-05 MED ORDER — METOPROLOL TARTRATE 25 MG PO TABS
25.0000 mg | ORAL_TABLET | Freq: Two times a day (BID) | ORAL | Status: DC
  Administered 2017-04-05 – 2017-04-07 (×4): 25 mg via ORAL
  Filled 2017-04-05 (×4): qty 1

## 2017-04-05 NOTE — Progress Notes (Addendum)
      [address]       [place] [address]             [phone]      6 Days Post-Op Procedure(s) (LRB): VIDEO ASSISTED THORACOSCOPY (VATS)/WEDGE RESECTION (Right) RIGHT UPPER LOBECTOMY LUNG (Right) Subjective: Feels good this morning. Walked already.   Objective: Vital signs in last 24 hours: Temp:  [98 F (36.7 C)-98.7 F (37.1 C)] 98.6 F (37 C) (10/14 0903) Pulse Rate:  [87-93] 93 (10/14 0903) Cardiac Rhythm: Atrial fibrillation (10/14 0706) Resp:  [15-20] 19 (10/14 0903) BP: (101-141)/(49-84) 141/75 (10/14 0903) SpO2:  [95 %-98 %] 96 % (10/14 0903) Weight:  [163 lb 4.8 oz (74.1 kg)] 163 lb 4.8 oz (74.1 kg) (10/14 0419)    Intake/Output from previous day: 10/13 0701 - 10/14 0700 In: -  Out: 150 [Chest Tube:150] Intake/Output this shift: No intake/output data recorded.  General appearance: alert, cooperative and no distress Heart: irregularly irregular Lungs: clear to auscultation bilaterally Abdomen: soft, non-tender; bowel sounds normal; no masses,  no organomegaly Extremities: extremities normal, atraumatic, no cyanosis or edema Wound: clean and dry  Lab Results:  Recent Labs  04/04/17 0344  WBC 6.2  HGB 9.3*  HCT 29.9*  PLT 137*   BMET:  Recent Labs  04/04/17 0344  NA 137  K 3.9  CL 104  CO2 23  GLUCOSE 120*  BUN 19  CREATININE 0.89  CALCIUM 8.7*    PT/INR: No results for input(s): LABPROT, INR in the last 72 hours. ABG    Component Value Date/Time   PHART 7.376 03/31/2017 0346   HCO3 24.1 03/31/2017 0346   TCO2 25 03/31/2017 0346   ACIDBASEDEF 1.0 03/31/2017 0346   O2SAT 98.0 03/31/2017 0346   CBG (last 3)  No results for input(s): GLUCAP in the last 72 hours.  Assessment/Plan: S/P Procedure(s) (LRB): VIDEO ASSISTED THORACOSCOPY (VATS)/WEDGE RESECTION (Right) RIGHT UPPER LOBECTOMY LUNG (Right)  1. CV- Rate controlled atrial fibrillation this morning rate in the 90s. Will increase Lopressor and order an EKG  to further evaluate- continue Norvasc, Lisinopril, Lopressor, Plavix 2. Pulm- continued 1+ air leak on suction, Tiny residual apical pneumo on the right < 5% on CXR this morning. 171ml/24 hours documented out of chest tube.  3. Renal- creatinine down to 0.89, electrolytes okay.  4. Thrombocytopenia- Plt count increased to 137k, HIT panel pending. H and H stable 5. Dispo- Will continue chest tube for now. CXR appears to be improving. Increase BB for atrial fib.     LOS: 6 days    Elgie Collard 04/05/2017 Patient seen and examined He is still in chronic atrial fib His air leak is smaller again today  Remo Lipps C. Roxan Hockey, MD [facility] [phone]

## 2017-04-06 ENCOUNTER — Inpatient Hospital Stay (HOSPITAL_COMMUNITY): Payer: Medicare Other

## 2017-04-06 NOTE — Progress Notes (Addendum)
      [address]       [place] [address]             [phone]       7 Days Post-Op Procedure(s) (LRB): VIDEO ASSISTED THORACOSCOPY (VATS)/WEDGE RESECTION (Right) RIGHT UPPER LOBECTOMY LUNG (Right)  Subjective: Patient with a fair amount of incisional pain, chest tube pain this am  Objective: Vital signs in last 24 hours: Temp:  [98.2 F (36.8 C)-98.6 F (37 C)] 98.2 F (36.8 C) (10/15 0421) Pulse Rate:  [86-93] 86 (10/15 0421) Cardiac Rhythm: Atrial fibrillation (10/14 1900) Resp:  [16-21] 21 (10/15 0421) BP: (108-141)/(63-95) 112/81 (10/15 0421) SpO2:  [94 %-99 %] 94 % (10/15 0421) Weight:  [160 lb 9.6 oz (72.8 kg)] 160 lb 9.6 oz (72.8 kg) (10/15 0421)     Intake/Output from previous day: 10/14 0701 - 10/15 0700 In: -  Out: 130 [Chest Tube:130]   Physical Exam:  Cardiovascular: IRRR IRRR Pulmonary: Clear to auscultation bilaterally. Wounds: Clean and dry.  No erythema or signs of infection. Ecchymosis right chest wall Chest Tube: to suction, no air leak this am  Lab Results: CBC: Recent Labs  04/04/17 0344  WBC 6.2  HGB 9.3*  HCT 29.9*  PLT 137*   BMET:  Recent Labs  04/04/17 0344  NA 137  K 3.9  CL 104  CO2 23  GLUCOSE 120*  BUN 19  CREATININE 0.89  CALCIUM 8.7*    PT/INR: No results for input(s): LABPROT, INR in the last 72 hours. ABG:  INR: Will add last result for INR, ABG once components are confirmed Will add last 4 CBG results once components are confirmed  Assessment/Plan:  1. CV - Has history of persistent a fib. He remains in a fib with CVR. On Lopressor 25 mg bid, Lisinopril 40 mg daily, Amlodipine 5 mg daily, and Plavix 75 mg daily. 2.  Pulmonary - On room air. Chest tube with 130 cc of output last 24 hours. Chest tube is to suction. CXR this am shows a right apical pneumothorax and bilateral subcutaneous emphysema neck and lateral chest walls. Hope to place chest tube to water seal soon. Encourage  incentive spirometer. 3. Anemia-Last H and H stable at 9.3 and 29.9 4. Thrombocytopenia-last platelets 137,000.Heparin induced platelet antibody is 0.319.  ZIMMERMAN,DONIELLE MPA-C 04/06/2017,7:27 AM Patient seen and examined, agree with above CT to water seal Repeat CXR in AM  Steven C. Roxan Hockey, MD [facility] [phone]

## 2017-04-07 ENCOUNTER — Inpatient Hospital Stay (HOSPITAL_COMMUNITY): Payer: Medicare Other

## 2017-04-07 ENCOUNTER — Ambulatory Visit: Payer: Medicare Other | Admitting: Cardiovascular Disease

## 2017-04-07 MED ORDER — OXYCODONE HCL 5 MG PO TABS: 5.0000 mg | ORAL_TABLET | ORAL | 0 refills | Status: DC | PRN

## 2017-04-07 NOTE — Progress Notes (Signed)
      [address]       [place] [address]             [phone]      8 Days Post-Op Procedure(s) (LRB): VIDEO ASSISTED THORACOSCOPY (VATS)/WEDGE RESECTION (Right) RIGHT UPPER LOBECTOMY LUNG (Right)   Subjective:  No new complaints.  Hoping chest tube comes out today Objective: Vital signs in last 24 hours: Temp:  [97.5 F (36.4 C)-98.5 F (36.9 C)] 97.5 F (36.4 C) (10/16 0400) Pulse Rate:  [62-91] 62 (10/16 0400) Cardiac Rhythm: Atrial fibrillation (10/16 0300) Resp:  [18-24] 24 (10/16 0400) BP: (91-147)/(53-69) 118/66 (10/16 0400) SpO2:  [90 %-100 %] 90 % (10/16 0400) Weight:  [167 lb 1.6 oz (75.8 kg)] 167 lb 1.6 oz (75.8 kg) (10/16 0500)  Intake/Output from previous day: 10/15 0701 - 10/16 0700 In: 600 [P.O.:600] Out: 50 [Chest Tube:50]  General appearance: alert, cooperative and no distress Heart: regular rate and rhythm Lungs: clear to auscultation bilaterally Abdomen: soft, non-tender; bowel sounds normal; no masses,  no organomegaly Extremities: extremities normal, atraumatic, no cyanosis or edema Wound: clean and dry  Lab Results: No results for input(s): WBC, HGB, HCT, PLT in the last 72 hours. BMET: No results for input(s): NA, K, CL, CO2, GLUCOSE, BUN, CREATININE, CALCIUM in the last 72 hours.  PT/INR: No results for input(s): LABPROT, INR in the last 72 hours. ABG    Component Value Date/Time   PHART 7.376 03/31/2017 0346   HCO3 24.1 03/31/2017 0346   TCO2 25 03/31/2017 0346   ACIDBASEDEF 1.0 03/31/2017 0346   O2SAT 98.0 03/31/2017 0346   CBG (last 3)  No results for input(s): GLUCAP in the last 72 hours.  Assessment/Plan: S/P Procedure(s) (LRB): VIDEO ASSISTED THORACOSCOPY (VATS)/WEDGE RESECTION (Right) RIGHT UPPER LOBECTOMY LUNG (Right)  1. Chest tube- no air leak present, CXR appears stable- will discuss chest tube removal with Dr. Roxan Hockey 2. Pulm- no acute issues, continue IS 3. CV- PAF, continue Lopressor,  Lisinopril and Norvasc 4. Thrombocytopenia- improved 5. Dispo- patient stable, chest tube removal today, will plan to d/c this afternoon if repeat CXR and patient remains clinically stable   LOS: 8 days    [name] [name] 04/07/2017

## 2017-04-07 NOTE — Care Management Important Message (Signed)
Important Message  Patient Details  Name: [name] [name] MRN: [id number] Date of Birth: 1941-06-11   Medicare Important Message Given:  Yes    [name] [name] Ab[place] 04/07/2017, 11:11 AM

## 2017-04-07 NOTE — Progress Notes (Signed)
Chest tube removed as per orders.  Pt tolerated well.

## 2017-04-10 ENCOUNTER — Ambulatory Visit (INDEPENDENT_AMBULATORY_CARE_PROVIDER_SITE_OTHER): Payer: Medicare Other | Admitting: Urology

## 2017-04-10 DIAGNOSIS — C678 Malignant neoplasm of overlapping sites of bladder: Secondary | ICD-10-CM | POA: Diagnosis not present

## 2017-04-15 ENCOUNTER — Ambulatory Visit (INDEPENDENT_AMBULATORY_CARE_PROVIDER_SITE_OTHER): Payer: Medicare Other | Admitting: Urology

## 2017-04-15 DIAGNOSIS — C678 Malignant neoplasm of overlapping sites of bladder: Secondary | ICD-10-CM

## 2017-04-20 ENCOUNTER — Other Ambulatory Visit: Payer: Self-pay | Admitting: Thoracic Surgery (Cardiothoracic Vascular Surgery)

## 2017-04-20 DIAGNOSIS — R911 Solitary pulmonary nodule: Secondary | ICD-10-CM

## 2017-04-21 ENCOUNTER — Ambulatory Visit
Admission: RE | Admit: 2017-04-21 | Discharge: 2017-04-21 | Disposition: A | Discharge status: Home / Self Care | Payer: Medicare Other | Source: Ambulatory Visit | Attending: Thoracic Surgery (Cardiothoracic Vascular Surgery) | Admitting: Thoracic Surgery (Cardiothoracic Vascular Surgery)

## 2017-04-21 ENCOUNTER — Ambulatory Visit (INDEPENDENT_AMBULATORY_CARE_PROVIDER_SITE_OTHER): Payer: Self-pay | Admitting: Thoracic Surgery (Cardiothoracic Vascular Surgery)

## 2017-04-21 ENCOUNTER — Encounter: Payer: Self-pay | Admitting: Thoracic Surgery (Cardiothoracic Vascular Surgery)

## 2017-04-21 VITALS — BP 132/76 | HR 66 | Ht 69.0 in | Wt 163.0 lb

## 2017-04-21 DIAGNOSIS — C3491 Malignant neoplasm of unspecified part of right bronchus or lung: Secondary | ICD-10-CM

## 2017-04-21 DIAGNOSIS — J90 Pleural effusion, not elsewhere classified: Secondary | ICD-10-CM | POA: Diagnosis not present

## 2017-04-21 DIAGNOSIS — R911 Solitary pulmonary nodule: Secondary | ICD-10-CM

## 2017-04-21 NOTE — Progress Notes (Signed)
[address]       [place] [address]             [phone]    HPI: [name] returns for a scheduled follow-up visit  [name] is a 76 year old man presented back in April with shortness of breath.  His chest x-ray showed a right upper lobe lung nodule.  That was confirmed on CT.  The nodule was hypermetabolic on PET.  He was referred and I first saw him in July.  He had critical aortic stenosis and underwent TAVR.  He did well after his aortic valve procedure and wished to pursue surgical resection.  I did a thoracoscopic right upper lobectomy on 03/30/2017.  His postoperative course was uncomplicated.  He feels well.  He took 2 pain pills after he first got home but has not taken any since.  He has a little soreness.  He is not having any problems with shortness of breath.  He is anxious to increase his activities.  Past Medical History:  Diagnosis Date  . AAA (abdominal aortic aneurysm) ([place])   . Aortic stenosis, severe    a. 02/2017: s/p TAVR with Edwards Sapien 3 THV (size 26 mm, model # 9600TFX, serial # [id number])  . Arthritis   . Atherosclerotic peripheral vascular disease ([facility])   . Atrial fibrillation, persistent ([place])    a. not a candidate for [place] given hx of GI bleed  . Bilateral carotid artery stenosis   . COPD (chronic obstructive pulmonary disease) ([facility])   . Hyperlipidemia   . Hypertension   . Hypothyroidism   . Lung cancer ([place])    Right  . Lung mass    Right  . Skin cancer   . Vitamin D deficiency     Current Outpatient Prescriptions  Medication Sig Dispense Refill  . alfuzosin (UROXATRAL) 10 MG 24 hr tablet Take 10 mg by mouth at bedtime.     Marland Kitchen amLODipine (NORVASC) 5 MG tablet Take 1 tablet (5 mg total) by mouth daily. 30 tablet 5  . aspirin EC 81 MG tablet Take 1 tablet (81 mg total) by mouth daily. 90 tablet 3  . atorvastatin (LIPITOR) 40 MG tablet Take 1 tablet (40 mg total) by mouth daily. 30 tablet 5  . cholecalciferol (VITAMIN  D) 1000 UNITS tablet Take 1 tablet (1,000 Units total) by mouth daily. 30 tablet 6  . clopidogrel (PLAVIX) 75 MG tablet Take 1 tablet (75 mg total) by mouth daily with breakfast. 30 tablet 6  . ferrous gluconate (FERGON) 324 MG tablet Take 1 tablet (324 mg total) by mouth 2 (two) times daily with a meal. 60 tablet 6  . levothyroxine (SYNTHROID, LEVOTHROID) 75 MCG tablet Take 1 tablet (75 mcg total) by mouth daily. 30 tablet 8  . lisinopril (PRINIVIL,ZESTRIL) 40 MG tablet Take 1 tablet (40 mg total) by mouth daily. 30 tablet 5  . metoprolol tartrate (LOPRESSOR) 25 MG tablet Take 0.5 tablets (12.5 mg total) by mouth 2 (two) times daily. 30 tablet 6  . Multiple Vitamin (ONE-A-DAY MENS PO) Take 1 tablet by mouth daily.      . Omega-3 Fatty Acids (FISH OIL) 1200 MG CAPS Take 2,400 mg by mouth daily.     . pantoprazole (PROTONIX) 40 MG tablet Take 1 tablet (40 mg total) by mouth 2 (two) times daily. 60 tablet 6   No current facility-administered medications for this visit.     Physical Exam BP 132/76  Pulse 66   Ht 5\' 9"  (1.753 m)   Wt 163 lb (73.9 kg)   SpO2 99%   BMI 24.52 kg/m  76 year old man in no acute distress Alert and oriented x3 with no focal deficits Lungs clear with equal breath sounds bilaterally Cardiac irregular rate and rhythm Wound intact with seroma superiorly, minimal erythema around chest tube sutures which were removed.  Diagnostic Tests: CHEST  2 VIEW  COMPARISON:  Chest x-ray of April 07, 2017  FINDINGS: The right apical pneumothorax has resolved. There is persistent tenting of the right hemidiaphragm consistent with volume loss. A small right pleural effusion is present. The left lung is well-expanded and clear. The heart and pulmonary vascularity are normal. There is calcification in the wall of the thoracic aorta. An aortic valve cage is present. The bony thorax exhibits no acute abnormality.  IMPRESSION: Interval resolution of the small right  apical pneumothorax. There is a small right pleural effusion. No evidence of CHF.  Thoracic aortic atherosclerosis.   Electronically Signed   By: David  Martinique M.D.   On: 04/21/2017 15:48 I personally reviewed the chest x-ray and concur with the findings noted above.  Impression: [name] is a 76 year old gentleman with a past history of tobacco abuse, COPD, critical aortic stenosis, chronic atrial fibrillation, hypertension, hyperlipidemia, abdominal aortic aneurysm, and bladder cancer.  He underwent a thoracoscopic right upper lobectomy for what turned out to be a stage IA non-small cell carcinoma about 3 weeks ago.  He is doing extremely well at this time with minimal discomfort and good performance status.  He does have a wound seroma but no evidence infection.  I will recheck that again in about a month.  He may begin driving, appropriate precautions were discussed.  There are no restrictions on his activities but he was cautioned to build into new activities gradually  Plan: Referral to multidisciplinary thoracic oncology clinic to see Dr. Julien [name]  Return in 1 month with PA and lateral chest x-ray to check on progress.  [name] Nakayama, MD [facility] [phone]

## 2017-04-27 ENCOUNTER — Other Ambulatory Visit: Payer: Self-pay | Admitting: Nurse Practitioner

## 2017-04-27 DIAGNOSIS — E782 Mixed hyperlipidemia: Secondary | ICD-10-CM

## 2017-04-27 DIAGNOSIS — I10 Essential (primary) hypertension: Secondary | ICD-10-CM

## 2017-04-28 ENCOUNTER — Ambulatory Visit: Payer: Medicare Other | Admitting: Thoracic Surgery (Cardiothoracic Vascular Surgery)

## 2017-04-29 ENCOUNTER — Telehealth: Payer: Self-pay | Admitting: *Deleted

## 2017-04-29 DIAGNOSIS — R911 Solitary pulmonary nodule: Secondary | ICD-10-CM

## 2017-04-29 NOTE — Telephone Encounter (Signed)
Oncology Nurse Navigator Documentation  Oncology Nurse Navigator Flowsheets 04/29/2017  Navigator Location [facility]  Referral date to RadOnc/MedOnc 04/21/2017  Navigator Encounter Type Telephone/I received referral on Mr. Sampath. I was unable to reach. I left vm message to call with my name and phone number.   Telephone Outgoing Call  Treatment Phase Other  Barriers/Navigation Needs Coordination of Care  Interventions Coordination of Care  Coordination of Care Appts  Acuity Level 1  Time Spent with Patient 15

## 2017-04-29 NOTE — Telephone Encounter (Signed)
Oncology Nurse Navigator Documentation  Oncology Nurse Navigator Flowsheets 04/29/2017  Navigator Location [facility]  Navigator Encounter Type Telephone/I called [name] [name]. I spoke with him and updated him on appt for [facility] next week. He verbalized understanding of appt time and place   Telephone Outgoing Call  Treatment Phase Other  Barriers/Navigation Needs Coordination of Care  Interventions Coordination of Care  Coordination of Care Appts  Acuity Level 1  Time Spent with Patient 15

## 2017-04-30 ENCOUNTER — Other Ambulatory Visit: Payer: Self-pay | Admitting: Nurse Practitioner

## 2017-04-30 DIAGNOSIS — E782 Mixed hyperlipidemia: Secondary | ICD-10-CM

## 2017-05-01 ENCOUNTER — Telehealth: Payer: Self-pay | Admitting: *Deleted

## 2017-05-01 NOTE — Telephone Encounter (Signed)
Oncology Nurse Navigator Documentation  Oncology Nurse Navigator Flowsheets 05/01/2017  Navigator Location [facility]  Navigator Encounter Type Telephone/Mr. Bedoy called me today. He needed clarification on apt. I clarified. He was thankful for the help.   Telephone Incoming Call  Treatment Phase Other  Barriers/Navigation Needs Education  Education Other  Interventions Education  Education Method Verbal  Acuity Level 1  Time Spent with Patient 15

## 2017-05-06 ENCOUNTER — Telehealth: Payer: Self-pay | Admitting: Internal Medicine

## 2017-05-06 NOTE — Telephone Encounter (Signed)
Confirmed [facility] appointment with patient for 05/07/17

## 2017-05-07 ENCOUNTER — Telehealth: Payer: Self-pay | Admitting: Internal Medicine

## 2017-05-07 ENCOUNTER — Other Ambulatory Visit (HOSPITAL_BASED_OUTPATIENT_CLINIC_OR_DEPARTMENT_OTHER): Payer: Medicare Other

## 2017-05-07 ENCOUNTER — Encounter: Payer: Self-pay | Admitting: Internal Medicine

## 2017-05-07 ENCOUNTER — Ambulatory Visit (HOSPITAL_BASED_OUTPATIENT_CLINIC_OR_DEPARTMENT_OTHER): Payer: Medicare Other | Admitting: Internal Medicine

## 2017-05-07 VITALS — BP 153/84 | HR 83 | Temp 98.3°F | Resp 18 | Ht 69.0 in | Wt 164.4 lb

## 2017-05-07 DIAGNOSIS — C3411 Malignant neoplasm of upper lobe, right bronchus or lung: Secondary | ICD-10-CM | POA: Diagnosis not present

## 2017-05-07 DIAGNOSIS — R911 Solitary pulmonary nodule: Secondary | ICD-10-CM

## 2017-05-07 DIAGNOSIS — Z7189 Other specified counseling: Secondary | ICD-10-CM

## 2017-05-07 DIAGNOSIS — C3490 Malignant neoplasm of unspecified part of unspecified bronchus or lung: Secondary | ICD-10-CM

## 2017-05-07 LAB — COMPREHENSIVE METABOLIC PANEL
ALT: 14 U/L (ref 0–55)
ANION GAP: 9 meq/L (ref 3–11)
AST: 23 U/L (ref 5–34)
Albumin: 3.7 g/dL (ref 3.5–5.0)
Alkaline Phosphatase: 85 U/L (ref 40–150)
BUN: 14.5 mg/dL (ref 7.0–26.0)
CO2: 25 meq/L (ref 22–29)
CREATININE: 1 mg/dL (ref 0.7–1.3)
Calcium: 9.6 mg/dL (ref 8.4–10.4)
Chloride: 106 mEq/L (ref 98–109)
EGFR: >60 mL/min/{1.73_m2} (ref >60)
Glucose: 94 mg/dl (ref 70–140)
Potassium: 4.2 mEq/L (ref 3.5–5.1)
Sodium: 140 mEq/L (ref 136–145)
Total Bilirubin: 0.48 mg/dL (ref 0.20–1.20)
Total Protein: 7.9 g/dL (ref 6.4–8.3)

## 2017-05-07 LAB — CBC WITH DIFFERENTIAL/PLATELET
BASO%: 0.3 % (ref 0.0–2.0)
BASOS ABS: 0 10*3/uL (ref 0.0–0.1)
EOS ABS: 0.2 10*3/uL (ref 0.0–0.5)
EOS%: 3.6 % (ref 0.0–7.0)
HCT: 40.1 % (ref 38.4–49.9)
HEMOGLOBIN: 13.1 g/dL (ref 13.0–17.1)
LYMPH%: 29.3 % (ref 14.0–49.0)
MCH: 29.7 pg (ref 27.2–33.4)
MCHC: 32.7 g/dL (ref 32.0–36.0)
MCV: 90.9 fL (ref 79.3–98.0)
MONO#: 0.6 10*3/uL (ref 0.1–0.9)
MONO%: 9.8 % (ref 0.0–14.0)
NEUT#: 3.3 10*3/uL (ref 1.5–6.5)
NEUT%: 57 % (ref 39.0–75.0)
Platelets: 157 10*3/uL (ref 140–400)
RBC: 4.41 10*6/uL (ref 4.20–5.82)
RDW: 17.4 % — AB (ref 11.0–14.6)
WBC: 5.8 10*3/uL (ref 4.0–10.3)
lymph#: 1.7 10*3/uL (ref 0.9–3.3)

## 2017-05-07 NOTE — Telephone Encounter (Signed)
Gave patient AVS and calendar of upcoming May appointments.

## 2017-05-07 NOTE — Progress Notes (Signed)
[facility] [phone]   [phone] Multidisciplinary thoracic oncology clinic CONSULT NOTE  REFERRING PHYSICIAN: Dr. Modesto Charon.  REASON FOR CONSULTATION:  76 years old white male recently diagnosed with lung cancer.  HPI [name] [name] is a 76 y.o. male with past medical history significant for multiple medical problems including history of hypertension, COPD, atrial fibrillation, hypothyroidism, abdominal aortic aneurysm, skin cancer, vitamin D deficiency, bilateral carotid artery stenosis as well as history of bladder cancer status post surgical resection by Dr. Diona Fanti.  The patient was undergoing evaluation for aortic valve stenosis and chest x-ray on 10/20/2016 showed 1.7 cm nodular density in the right upper lobe.  This was followed by screening CT scan of the chest on 11/09/2016 and that showed large aggressive appearing nodule with highly irregular macrolobulated in the slightly spiculated margins in the anterior aspect of the right upper lobe measuring 1.56 cm highly suspicious for primary bronchogenic neoplasm.  There was additional sub-solid nodule in the right upper lobe measuring 1.07 cm.  The patient was seen by Dr. Elsworth Soho and a PET scan on 12/02/2016 showed anterior right upper lobe spiculated pulmonary nodule markedly hypermetabolic consistent with neoplasm.  There was upper normal-sized mediastinal lymph nodes with low-level FDG accumulation.  There was also a focal area of hypermetabolism identified in the right prostate gland questionable for prostate neoplasm.  MRI of the brain on 01/13/2017 was normal with no evidence of intracranial metastatic disease or other acute intracranial abnormality. The patient was referred to Dr. Roxan Hockey and on March 30, 2017 he underwent a right VATS with right upper lobectomy and mediastinal lymph node dissection. The final pathology [id number]) was consistent with squamous cell carcinoma  measuring 1.6 cm with tumor involving the subpleural connective tissue but the final margins were negative.  There was no evidence for visceral pleural or lymphovascular invasion.  The dissected lymph nodes were negative for malignancy.  The final pathological stage was pT1a, pN0. Dr. Roxan Hockey kindly referred the patient to the multidisciplinary thoracic oncology clinic today for evaluation and recommendation regarding his condition. When seen today the patient is feeling fine except for the mild soreness in the right side of the chest at the surgical scar.  He also has shortness of breath with exertion and mild cough with no hemoptysis.  He denied having any weight loss or night sweats.  He has no nausea, vomiting, diarrhea or constipation.  He denied having any headache or visual changes. Family history significant for a mother with lung cancer in father had heart attack. The patient is married and had 2 sons, 1 of them killed in a car accident.  He was accompanied today by his wife [name] [name].  He used to work in [employer].  The patient has a history of smoking 1 pack/day for around 45 years and quit in 2007.  He has no history of alcohol or drug abuse. HPI  Past Medical History:  Diagnosis Date  . AAA (abdominal aortic aneurysm) ([place])   . Aortic stenosis, severe    a. 02/2017: s/p TAVR with Edwards Sapien 3 THV (size 26 mm, model # 9600TFX, serial # [id number])  . Arthritis   . Atherosclerotic peripheral vascular disease ([place])   . Atrial fibrillation, persistent ([place])    a. not a candidate for [place] given hx of GI bleed  . Bilateral carotid artery stenosis   . COPD (chronic obstructive pulmonary disease) ([place])   . Hyperlipidemia   . Hypertension   .  Hypothyroidism   . Lung cancer ([facility])    Right  . Lung mass    Right  . Skin cancer   . Vitamin D deficiency     Past Surgical History:  Procedure Laterality Date  . ABDOMINAL AORTIC ANEURYSM REPAIR  08-09-09  . BACK SURGERY     . ESOPHAGOGASTRODUODENOSCOPY N/A 02/21/2017   Procedure: ESOPHAGOGASTRODUODENOSCOPY (EGD);  Surgeon: Jerene Bears, MD;  Location: [facility] ENDOSCOPY;  Service: Gastroenterology;  Laterality: N/A;  . HYDROCELE EXCISION / REPAIR    . LOBECTOMY Right 03/30/2017   Procedure: RIGHT UPPER LOBECTOMY LUNG;  Surgeon: Melrose Nakayama, MD;  Location: [facility];  Service: Thoracic;  Laterality: Right;  . SKIN CANCER EXCISION    . SPINE SURGERY    . TEE WITHOUT CARDIOVERSION N/A 02/24/2017   Procedure: TRANSESOPHAGEAL ECHOCARDIOGRAM (TEE);  Surgeon: Sherren Mocha, MD;  Location: [facility];  Service: Open Heart Surgery;  Laterality: N/A;  . TRANSCATHETER AORTIC VALVE REPLACEMENT, TRANSFEMORAL N/A 02/24/2017   Procedure: TRANSCATHETER AORTIC VALVE REPLACEMENT, TRANSFEMORAL using a 82mm Edwards Sapien 3 Aortic Valve;  Surgeon: Sherren Mocha, MD;  Location: [facility];  Service: Open Heart Surgery;  Laterality: N/A;  . TRANSURETHRAL RESECTION OF BLADDER TUMOR N/A 06/06/2016   Procedure: TRANSURETHRAL RESECTION OF BLADDER TUMOR (TURBT);  Surgeon: Franchot Gallo, MD;  Location: [facility];  Service: Urology;  Laterality: N/A;  . TRANSURETHRAL RESECTION OF BLADDER TUMOR N/A 07/22/2016   Procedure: TRANSURETHRAL RESECTION OF BLADDER TUMOR (TURBT);  Surgeon: Franchot Gallo, MD;  Location: [facility];  Service: Urology;  Laterality: N/A;  . VIDEO ASSISTED THORACOSCOPY (VATS)/WEDGE RESECTION Right 03/30/2017   Procedure: VIDEO ASSISTED THORACOSCOPY (VATS)/WEDGE RESECTION;  Surgeon: Melrose Nakayama, MD;  Location[facility];  Service: Thoracic;  Laterality: Right;    Family History  Problem Relation Age of Onset  . Heart disease Father        Heart Disease before age 47  . Alcohol abuse Father   . Heart attack Father   . Cancer Mother        Lung  . COPD Mother   . COPD Sister   . Lupus Sister   . Hypertension Brother   . Gout Brother   . Heart attack Son   . Gout Brother   . Prostatitis Brother     Social  History Social History   Tobacco Use  . Smoking status: Former Smoker    Packs/day: 1.00    Years: 51.00    Pack years: 51.00    Types: Cigarettes    Start date: 11/25/1954    Last attempt to quit: 10/27/2005    Years since quitting: 11.5  . Smokeless tobacco: Never Used  Substance Use Topics  . Alcohol use: No    Alcohol/week: 0.0 oz    Comment: none since 1999  . Drug use: No    Allergies  Allergen Reactions  . Eliquis [Apixaban] Other (See Comments)    Eliquis and lovenox caused his " ulcer to bleed."  . Lovenox [Enoxaparin]     HIT panel ordered 10/10    Current Outpatient Medications  Medication Sig Dispense Refill  . alfuzosin (UROXATRAL) 10 MG 24 hr tablet Take 10 mg by mouth at bedtime.     Marland Kitchen amLODipine (NORVASC) 5 MG tablet Take 1 tablet (5 mg total) by mouth daily. 30 tablet 0  . aspirin EC 81 MG tablet Take 1 tablet (81 mg total) by mouth daily. 90 tablet 3  . atorvastatin (LIPITOR) 40 MG tablet  Take 1 tablet (40 mg total) by mouth daily. 30 tablet 0  . cholecalciferol (VITAMIN D) 1000 UNITS tablet Take 1 tablet (1,000 Units total) by mouth daily. 30 tablet 6  . clopidogrel (PLAVIX) 75 MG tablet Take 1 tablet (75 mg total) by mouth daily with breakfast. 30 tablet 6  . ferrous gluconate (FERGON) 324 MG tablet Take 1 tablet (324 mg total) by mouth 2 (two) times daily with a meal. 60 tablet 6  . levothyroxine (SYNTHROID, LEVOTHROID) 75 MCG tablet Take 1 tablet (75 mcg total) by mouth daily. 30 tablet 8  . lisinopril (PRINIVIL,ZESTRIL) 40 MG tablet Take 1 tablet (40 mg total) by mouth daily. 30 tablet 0  . metoprolol tartrate (LOPRESSOR) 25 MG tablet Take 0.5 tablets (12.5 mg total) by mouth 2 (two) times daily. 30 tablet 6  . Multiple Vitamin (ONE-A-DAY MENS PO) Take 1 tablet by mouth daily.      . Omega-3 Fatty Acids (FISH OIL) 1200 MG CAPS Take 2,400 mg by mouth daily.     . pantoprazole (PROTONIX) 40 MG tablet Take 1 tablet (40 mg total) by mouth 2 (two) times daily.  60 tablet 6   No current facility-administered medications for this visit.     Review of Systems  Constitutional: positive for fatigue Eyes: negative Ears, nose, mouth, throat, and face: negative Respiratory: positive for dyspnea on exertion and pleurisy/chest pain Cardiovascular: negative Gastrointestinal: negative Genitourinary:negative Integument/breast: negative Hematologic/lymphatic: negative Musculoskeletal:negative Neurological: negative Behavioral/Psych: negative Endocrine: negative Allergic/Immunologic: negative  Physical Exam  EHU:DJSHF, healthy, no distress, well nourished and well developed SKIN: skin color, texture, turgor are normal, no rashes or significant lesions HEAD: Normocephalic, No masses, lesions, tenderness or abnormalities EYES: normal, PERRLA, Conjunctiva are pink and non-injected EARS: External ears normal, Canals clear OROPHARYNX:no exudate, no erythema and lips, buccal mucosa, and tongue normal  NECK: supple, no adenopathy, no JVD LYMPH:  no palpable lymphadenopathy, no hepatosplenomegaly LUNGS: clear to auscultation , and palpation HEART: regular rate & rhythm, no murmurs and no gallops ABDOMEN:abdomen soft, non-tender, normal bowel sounds and no masses or organomegaly BACK: Back symmetric, no curvature., No CVA tenderness EXTREMITIES:no joint deformities, effusion, or inflammation, no edema, no skin discoloration  NEURO: alert & oriented x 3 with fluent speech, no focal motor/sensory deficits  PERFORMANCE STATUS: ECOG 1  LABORATORY DATA: Lab Results  Component Value Date   WBC 5.8 05/07/2017   HGB 13.1 05/07/2017   HCT 40.1 05/07/2017   MCV 90.9 05/07/2017   PLT 157 05/07/2017      Chemistry      Component Value Date/Time   NA 140 05/07/2017 1419   K 4.2 05/07/2017 1419   CL 104 04/04/2017 0344   CO2 25 05/07/2017 1419   BUN 14.5 05/07/2017 1419   CREATININE 1.0 05/07/2017 1419      Component Value Date/Time   CALCIUM 9.6  05/07/2017 1419   ALKPHOS 85 05/07/2017 1419   AST 23 05/07/2017 1419   ALT 14 05/07/2017 1419   BILITOT 0.48 05/07/2017 1419       RADIOGRAPHIC STUDIES: Dg Chest 2 View  Result Date: 04/21/2017 CLINICAL DATA:  Status post video assisted thoracic surgery with right upper lobectomy on March 30, 2017 for malignancy. Follow-up study. EXAM: CHEST  2 VIEW COMPARISON:  Chest x-ray of April 07, 2017 FINDINGS: The right apical pneumothorax has resolved. There is persistent tenting of the right hemidiaphragm consistent with volume loss. A small right pleural effusion is present. The left lung is well-expanded and  clear. The heart and pulmonary vascularity are normal. There is calcification in the wall of the thoracic aorta. An aortic valve cage is present. The bony thorax exhibits no acute abnormality. IMPRESSION: Interval resolution of the small right apical pneumothorax. There is a small right pleural effusion. No evidence of CHF. Thoracic aortic atherosclerosis. Electronically Signed   By: David  Martinique M.D.   On: 04/21/2017 15:48    ASSESSMENT: This is a very pleasant 76 years old white male recently diagnosed with a stage Ia (T1a, N0, M0) non-small cell lung cancer, squamous cell carcinoma presented with right upper lobe lung nodule status post right upper lobectomy with lymph node dissection on March 30, 2017 under the care of Dr. Roxan Hockey.  PLAN: I had a lengthy discussion with the patient and his wife today about his current disease stage, prognosis and treatment options. I explained to the patient that the 5-year survival for the patient was a stage I a is around 80% after surgical resection.  I also explained to the patient that there is no benefit for adjuvant systemic chemotherapy for patient with a stage Ia non-small cell lung cancer and the current standard of care is observation. I recommended for the patient to come back for follow-up visit in 6 months with a repeat CT scan of the  chest for restaging of his disease.  We will monitor him with a scan every 6 months for the first 2 years followed by annual skin for a total of 5 years. The patient was seen during the multidisciplinary thoracic oncology clinic today by medical oncology, thoracic navigator and physical therapist. The patient was advised to call immediately if he has any concerning symptoms in the interval. The patient voices understanding of current disease status and treatment options and is in agreement with the current care plan.  All questions were answered. The patient knows to call the clinic with any problems, questions or concerns. We can certainly see the patient much sooner if necessary.  Thank you so much for allowing me to participate in the care of KAYLER RISE. I will continue to follow up the patient with you and assist in his care.  I spent 40 minutes counseling the patient face to face. The total time spent in the appointment was 60 minutes.  Disclaimer: This note was dictated with voice recognition software. Similar sounding words can inadvertently be transcribed and may not be corrected upon review.   Eilleen Kempf May 07, 2017, 3:46 PM

## 2017-05-13 ENCOUNTER — Other Ambulatory Visit: Payer: Self-pay | Admitting: Thoracic Surgery (Cardiothoracic Vascular Surgery)

## 2017-05-13 DIAGNOSIS — Z902 Acquired absence of lung [part of]: Secondary | ICD-10-CM

## 2017-05-19 ENCOUNTER — Encounter: Payer: Self-pay | Admitting: Thoracic Surgery (Cardiothoracic Vascular Surgery)

## 2017-05-19 ENCOUNTER — Ambulatory Visit
Admission: RE | Admit: 2017-05-19 | Discharge: 2017-05-19 | Disposition: A | Discharge status: Home / Self Care | Payer: Medicare Other | Source: Ambulatory Visit | Attending: Thoracic Surgery (Cardiothoracic Vascular Surgery) | Admitting: Thoracic Surgery (Cardiothoracic Vascular Surgery)

## 2017-05-19 ENCOUNTER — Ambulatory Visit (INDEPENDENT_AMBULATORY_CARE_PROVIDER_SITE_OTHER): Payer: Self-pay | Admitting: Thoracic Surgery (Cardiothoracic Vascular Surgery)

## 2017-05-19 ENCOUNTER — Other Ambulatory Visit: Payer: Self-pay

## 2017-05-19 VITALS — BP 138/84 | HR 76 | Ht 69.0 in | Wt 164.0 lb

## 2017-05-19 DIAGNOSIS — Z902 Acquired absence of lung [part of]: Secondary | ICD-10-CM

## 2017-05-19 DIAGNOSIS — C3491 Malignant neoplasm of unspecified part of right bronchus or lung: Secondary | ICD-10-CM

## 2017-05-19 DIAGNOSIS — R918 Other nonspecific abnormal finding of lung field: Secondary | ICD-10-CM | POA: Diagnosis not present

## 2017-05-19 NOTE — Progress Notes (Signed)
[address]       [place] [address]             [phone]    HPI: [name] returns for a scheduled follow-up visit  [name] is a 76 year old man who presented back in April with shortness of breath.  He was found to have a right upper lobe lung nodule.  He had critical aortic stenosis and underwent TAVR.  I did a thoracoscopic right upper lobectomy on 03/30/2017.  His postoperative course was uncomplicated.  Saw him back in the office on 04/21/2017.  He was doing well at that time.  He did have a small wound hematoma/seroma.  He saw Dr. Julien Nordmann.  He does not need any adjuvant therapy.  He continues to do well.  He is not having any significant pain.  He has noticed that when it is cold he does feel little more short of breath.  Past Medical History:  Diagnosis Date  . AAA (abdominal aortic aneurysm) ([place])   . Aortic stenosis, severe    a. 02/2017: s/p TAVR with Edwards Sapien 3 THV (size 26 mm, model # 9600TFX, serial # [id number])  . Arthritis   . Atherosclerotic peripheral vascular disease ([place])   . Atrial fibrillation, persistent ([place])    a. not a candidate for [place] given hx of GI bleed  . Bilateral carotid artery stenosis   . COPD (chronic obstructive pulmonary disease) ([place])   . Hyperlipidemia   . Hypertension   . Hypothyroidism   . Lung cancer ([place])    Right  . Lung mass    Right  . Skin cancer   . Vitamin D deficiency      Current Outpatient Medications  Medication Sig Dispense Refill  . alfuzosin (UROXATRAL) 10 MG 24 hr tablet Take 10 mg by mouth at bedtime.     Marland Kitchen amLODipine (NORVASC) 5 MG tablet Take 1 tablet (5 mg total) by mouth daily. 30 tablet 0  . aspirin EC 81 MG tablet Take 1 tablet (81 mg total) by mouth daily. 90 tablet 3  . atorvastatin (LIPITOR) 40 MG tablet Take 1 tablet (40 mg total) by mouth daily. 30 tablet 0  . cholecalciferol (VITAMIN D) 1000 UNITS tablet Take 1 tablet (1,000 Units total) by mouth daily. 30 tablet 6  .  clopidogrel (PLAVIX) 75 MG tablet Take 1 tablet (75 mg total) by mouth daily with breakfast. 30 tablet 6  . ferrous gluconate (FERGON) 324 MG tablet Take 1 tablet (324 mg total) by mouth 2 (two) times daily with a meal. 60 tablet 6  . levothyroxine (SYNTHROID, LEVOTHROID) 75 MCG tablet Take 1 tablet (75 mcg total) by mouth daily. 30 tablet 8  . lisinopril (PRINIVIL,ZESTRIL) 40 MG tablet Take 1 tablet (40 mg total) by mouth daily. 30 tablet 0  . metoprolol tartrate (LOPRESSOR) 25 MG tablet Take 0.5 tablets (12.5 mg total) by mouth 2 (two) times daily. 30 tablet 6  . Multiple Vitamin (ONE-A-DAY MENS PO) Take 1 tablet by mouth daily.      . Omega-3 Fatty Acids (FISH OIL) 1200 MG CAPS Take 2,400 mg by mouth daily.     . pantoprazole (PROTONIX) 40 MG tablet Take 1 tablet (40 mg total) by mouth 2 (two) times daily. 60 tablet 6   No current facility-administered medications for this visit.     Physical Exam BP 138/84   Pulse 76   Ht 5\' 9"  (1.753 m)   Wt  164 lb (74.4 kg)   SpO2 98%   BMI 24.66 kg/m  76 year old man in no acute distress Alert and oriented x3 with no focal deficits Lungs slightly diminished right base, otherwise clear Incision skin intact, small seroma approximately 1 cm in width and 3 cm in length, no erythema  Diagnostic Tests: CHEST  2 VIEW  COMPARISON:  04/21/2017  FINDINGS: There has been recent right lower lobectomy with tenting of the diaphragm. There is blunting of the right costophrenic angle likely reflecting scarring. There is no pneumothorax. The heart and mediastinal contours are unremarkable.  The osseous structures are unremarkable.  IMPRESSION: No active cardiopulmonary disease.   Electronically Signed   By: Kathreen Devoid   On: 05/19/2017 15:05 I personally reviewed the images and concur with the findings noted above  Impression: [name] is a 76 year old gentleman who had a thoracoscopic right upper lobectomy for stage IA squamous cell  carcinoma in early October.  He has done extremely well with his recovery.  He has minimal discomfort and no significant pulmonary compromise.  He does have a small wound seroma.  This is getting smaller with time.  Should resolve without any issue.  He will follow up with Dr. Julien Nordmann.  I will be happy to see [name] back anytime in the future if I can be of any further assistance with his care.   Melrose Nakayama, MD [facility] [phone]

## 2017-05-20 ENCOUNTER — Ambulatory Visit: Payer: Medicare Other | Admitting: Cardiovascular Disease

## 2017-05-20 ENCOUNTER — Encounter: Payer: Self-pay | Admitting: Cardiovascular Disease

## 2017-05-20 VITALS — BP 122/68 | HR 78 | Ht 70.0 in | Wt 178.0 lb

## 2017-05-20 DIAGNOSIS — Z952 Presence of prosthetic heart valve: Secondary | ICD-10-CM

## 2017-05-20 DIAGNOSIS — R911 Solitary pulmonary nodule: Secondary | ICD-10-CM | POA: Diagnosis not present

## 2017-05-20 DIAGNOSIS — I10 Essential (primary) hypertension: Secondary | ICD-10-CM

## 2017-05-20 DIAGNOSIS — I4819 Other persistent atrial fibrillation: Secondary | ICD-10-CM

## 2017-05-20 DIAGNOSIS — I5032 Chronic diastolic (congestive) heart failure: Secondary | ICD-10-CM

## 2017-05-20 DIAGNOSIS — I779 Disorder of arteries and arterioles, unspecified: Secondary | ICD-10-CM

## 2017-05-20 DIAGNOSIS — E7800 Pure hypercholesterolemia, unspecified: Secondary | ICD-10-CM

## 2017-05-20 DIAGNOSIS — I739 Peripheral vascular disease, unspecified: Secondary | ICD-10-CM

## 2017-05-20 DIAGNOSIS — D6489 Other specified anemias: Secondary | ICD-10-CM | POA: Diagnosis not present

## 2017-05-20 DIAGNOSIS — I2510 Atherosclerotic heart disease of native coronary artery without angina pectoris: Secondary | ICD-10-CM | POA: Diagnosis not present

## 2017-05-20 DIAGNOSIS — I481 Persistent atrial fibrillation: Secondary | ICD-10-CM | POA: Diagnosis not present

## 2017-05-20 NOTE — Patient Instructions (Signed)
Medication Instructions:  Your physician recommends that you continue on your current medications as directed. Please refer to the Current Medication list given to you today.  Labwork: NONE  Testing/Procedures: NONE  Follow-Up: Your physician wants you to follow-up in: [place]. [place]. You will receive a reminder letter in the mail two months in advance. If you don't receive a letter, please call our office to schedule the follow-up appointment.  Any Other Special Instructions Will Be Listed Below (If Applicable).  If you need a refill on your cardiac medications before your next appointment, please call your pharmacy.

## 2017-05-20 NOTE — Progress Notes (Signed)
SUBJECTIVE: The patient presents for routine follow-up.  He underwent TAVR in September 2018.  When he underwent right upper lobectomy for stage Ia squamous cell carcinoma as well. Coronary angiography on 01/14/17 showed 60% proximal LAD stenosis with mild nonobstructive disease in the circumflex and RCA.  It was decided to manage this medically.  He also has a history of COPD, abdominal aortic aneurysm status post stent graft repair in 2011, and atrial fibrillation.  He has a history of high-grade, non-muscle invasive bladder cancer and underwent surgery on 06/06/16.   Most recent echocardiogram on 03/25/17 demonstrated normally functioning prosthetic aortic valve with normal left ventricular systolic function and regional wall motion, LVEF 55-60%.  He developed GI bleeding on Eliquis and this was discontinued.  He was maintained on aspirin and Plavix.  It was noted that Plavix could be discontinued 6 months after TAVR.  He is doing very well and denies chest pain, shortness of breath, palpitations, and leg swelling.  He is grateful for the care he received.  Hemoglobin 13.1 on 05/07/17.   Review of Systems: As per "subjective", otherwise negative.  Allergies  Allergen Reactions  . Eliquis [Apixaban] Other (See Comments)    Eliquis and lovenox caused his " ulcer to bleed."  . Lovenox [Enoxaparin]     HIT panel ordered 10/10    Current Outpatient Medications  Medication Sig Dispense Refill  . alfuzosin (UROXATRAL) 10 MG 24 hr tablet Take 10 mg by mouth at bedtime.     Marland Kitchen amLODipine (NORVASC) 5 MG tablet Take 1 tablet (5 mg total) by mouth daily. 30 tablet 0  . aspirin EC 81 MG tablet Take 1 tablet (81 mg total) by mouth daily. 90 tablet 3  . atorvastatin (LIPITOR) 40 MG tablet Take 1 tablet (40 mg total) by mouth daily. 30 tablet 0  . cholecalciferol (VITAMIN D) 1000 UNITS tablet Take 1 tablet (1,000 Units total) by mouth daily. 30 tablet 6  . clopidogrel (PLAVIX) 75 MG tablet  Take 1 tablet (75 mg total) by mouth daily with breakfast. 30 tablet 6  . ferrous gluconate (FERGON) 324 MG tablet Take 1 tablet (324 mg total) by mouth 2 (two) times daily with a meal. 60 tablet 6  . levothyroxine (SYNTHROID, LEVOTHROID) 75 MCG tablet Take 1 tablet (75 mcg total) by mouth daily. 30 tablet 8  . lisinopril (PRINIVIL,ZESTRIL) 40 MG tablet Take 1 tablet (40 mg total) by mouth daily. 30 tablet 0  . metoprolol tartrate (LOPRESSOR) 25 MG tablet Take 0.5 tablets (12.5 mg total) by mouth 2 (two) times daily. 30 tablet 6  . Multiple Vitamin (ONE-A-DAY MENS PO) Take 1 tablet by mouth daily.      . Omega-3 Fatty Acids (FISH OIL) 1200 MG CAPS Take 2,400 mg by mouth daily.     . pantoprazole (PROTONIX) 40 MG tablet Take 1 tablet (40 mg total) by mouth 2 (two) times daily. 60 tablet 6   No current facility-administered medications for this visit.     Past Medical History:  Diagnosis Date  . AAA (abdominal aortic aneurysm) ([place])   . Aortic stenosis, severe    a. 02/2017: s/p TAVR with Edwards Sapien 3 THV (size 26 mm, model # 9600TFX, serial # [id number])  . Arthritis   . Atherosclerotic peripheral vascular disease ([place])   . Atrial fibrillation, persistent ([place])    a. not a candidate for [place] given hx of GI bleed  . Bilateral carotid artery stenosis   . COPD (  chronic obstructive pulmonary disease) ([place])   . Hyperlipidemia   . Hypertension   . Hypothyroidism   . Lung cancer ([place])    Right  . Lung mass    Right  . Skin cancer   . Vitamin D deficiency     Past Surgical History:  Procedure Laterality Date  . ABDOMINAL AORTIC ANEURYSM REPAIR  08-09-09  . BACK SURGERY    . ESOPHAGOGASTRODUODENOSCOPY N/A 02/21/2017   Procedure: ESOPHAGOGASTRODUODENOSCOPY (EGD);  Surgeon: Jerene Bears, MD;  Location: [facility] ENDOSCOPY;  Service: Gastroenterology;  Laterality: N/A;  . HYDROCELE EXCISION / REPAIR    . LOBECTOMY Right 03/30/2017   Procedure: RIGHT UPPER LOBECTOMY LUNG;  Surgeon: Melrose Nakayama, MD;  Location: [facility];  Service: Thoracic;  Laterality: Right;  . SKIN CANCER EXCISION    . SPINE SURGERY    . TEE WITHOUT CARDIOVERSION N/A 02/24/2017   Procedure: TRANSESOPHAGEAL ECHOCARDIOGRAM (TEE);  Surgeon: Sherren Mocha, MD;  Location: [facility];  Service: Open Heart Surgery;  Laterality: N/A;  . TRANSCATHETER AORTIC VALVE REPLACEMENT, TRANSFEMORAL N/A 02/24/2017   Procedure: TRANSCATHETER AORTIC VALVE REPLACEMENT, TRANSFEMORAL using a 50mm Edwards Sapien 3 Aortic Valve;  Surgeon: Sherren Mocha, MD;  Location: [facility];  Service: Open Heart Surgery;  Laterality: N/A;  . TRANSURETHRAL RESECTION OF BLADDER TUMOR N/A 06/06/2016   Procedure: TRANSURETHRAL RESECTION OF BLADDER TUMOR (TURBT);  Surgeon: Franchot Gallo, MD;  Location: [facility];  Service: Urology;  Laterality: N/A;  . TRANSURETHRAL RESECTION OF BLADDER TUMOR N/A 07/22/2016   Procedure: TRANSURETHRAL RESECTION OF BLADDER TUMOR (TURBT);  Surgeon: Franchot Gallo, MD;  Location: [facility];  Service: Urology;  Laterality: N/A;  . VIDEO ASSISTED THORACOSCOPY (VATS)/WEDGE RESECTION Right 03/30/2017   Procedure: VIDEO ASSISTED THORACOSCOPY (VATS)/WEDGE RESECTION;  Surgeon: Melrose Nakayama, MD;  Location: [facility] OR;  Service: Thoracic;  Laterality: Right;    Social History   Socioeconomic History  . Marital status: Married    Spouse name: Not on file  . Number of children: Not on file  . Years of education: Not on file  . Highest education level: Not on file  Social Needs  . Financial resource strain: Not on file  . Food insecurity - worry: Not on file  . Food insecurity - inability: Not on file  . Transportation needs - medical: Not on file  . Transportation needs - non-medical: Not on file  Occupational History  . Occupation: Retired    Comment: Librarian, academic with [employer]  Tobacco Use  . Smoking status: Former Smoker    Packs/day: 1.00    Years: 51.00    Pack years: 51.00    Types: Cigarettes    Start date:  11/25/1954    Last attempt to quit: 10/27/2005    Years since quitting: 11.5  . Smokeless tobacco: Never Used  Substance and Sexual Activity  . Alcohol use: No    Alcohol/week: 0.0 oz    Comment: none since 1999  . Drug use: No  . Sexual activity: Yes  Other Topics Concern  . Not on file  Social History Narrative  . Not on file     Vitals:   05/20/17 1052  BP: 122/68  Pulse: 78  SpO2: 98%  Weight: 178 lb (80.7 kg)  Height: 5\' 10"  (1.778 m)    Wt Readings from Last 3 Encounters:  05/20/17 178 lb (80.7 kg)  05/19/17 164 lb (74.4 kg)  05/07/17 164 lb 6.4 oz (74.6 kg)     PHYSICAL EXAM General:  NAD HEENT: Normal. Neck: No JVD, no thyromegaly. Lungs: Clear to auscultation bilaterally with normal respiratory effort. CV: Regular rate and rhythm, normal S1/S2, no S3/S4, no murmur. No pretibial or periankle edema.  Abdomen: Soft, nontender, no distention.  Neurologic: Alert and oriented.  Psych: Normal affect. Skin: Normal. Musculoskeletal: No gross deformities.    ECG: Most recent ECG reviewed.   Labs: Lab Results  Component Value Date/Time   K 4.2 05/07/2017 02:19 PM   BUN 14.5 05/07/2017 02:19 PM   CREATININE 1.0 05/07/2017 02:19 PM   ALT 14 05/07/2017 02:19 PM   TSH 4.490 10/20/2016 10:22 AM   HGB 13.1 05/07/2017 02:19 PM     Lipids: Lab Results  Component Value Date/Time   LDLCALC 41 10/20/2016 10:22 AM   LDLCALC 61 04/13/2014 08:46 AM   LDLCALC 52 01/14/2013 11:57 AM   CHOL 84 (L) 10/20/2016 10:22 AM   CHOL 118 01/14/2013 11:57 AM   TRIG 78 10/20/2016 10:22 AM   TRIG 160 (H) 04/13/2014 08:46 AM   TRIG 178 (H) 01/14/2013 11:57 AM   HDL 27 (L) 10/20/2016 10:22 AM   HDL 32 (L) 04/13/2014 08:46 AM   HDL 30 (L) 01/14/2013 11:57 AM       ASSESSMENT AND PLAN:  1. Severe aortic stenosis s/p TAVR in 02/2017: Symptomatically stable.  Normal prosthetic valve function as noted above by most recent echocardiogram in October 2018.  Plavix can be discontinued  in mid March 2019.  2. Chronic diastolic heart failure: Euvolemic and stable.  No change to therapy.  3. Persistent atrial fibrillation: Symptomatically stable on metoprolol.  Currently not an anticoagulation candidate given history of GI bleeding. Hemoglobin 13.1 on 05/07/17.  He is already taking aspirin and Plavix as noted above.  4.  Coronary artery disease: Symptomatically stable.  Continue aspirin, Lipitor, and metoprolol.  5.  Hyperlipidemia: Continue Lipitor.  6.  Chronic hypertension: Blood pressure is controlled.  No changes to therapy.  7.  Peripheral vascular disease: Followed by vascular surgery.  ABIs were normal on 07/23/15.  Continue aspirin and statin.  8. Anemia: Hemoglobin 13.1 on 05/07/17.   Disposition: Follow up 6 months   Kate Sable, M.D., F.A.C.C.

## 2017-05-26 ENCOUNTER — Other Ambulatory Visit: Payer: Self-pay | Admitting: Nurse Practitioner

## 2017-05-26 DIAGNOSIS — I10 Essential (primary) hypertension: Secondary | ICD-10-CM

## 2017-05-26 DIAGNOSIS — E782 Mixed hyperlipidemia: Secondary | ICD-10-CM

## 2017-05-27 ENCOUNTER — Ambulatory Visit (INDEPENDENT_AMBULATORY_CARE_PROVIDER_SITE_OTHER): Payer: Medicare Other | Admitting: *Deleted

## 2017-05-27 ENCOUNTER — Encounter: Payer: Self-pay | Admitting: *Deleted

## 2017-05-27 VITALS — BP 138/74 | HR 78 | Ht 68.0 in | Wt 170.0 lb

## 2017-05-27 DIAGNOSIS — Z0000 Encounter for general adult medical examination without abnormal findings: Secondary | ICD-10-CM | POA: Diagnosis not present

## 2017-05-27 NOTE — Patient Instructions (Signed)
  Mr. Roggenkamp,  Thank you for taking time to come for your Medicare Wellness Visit. I appreciate your ongoing commitment to your health goals. Please review the following plan we discussed and let me know if I can assist you in the future.   These are the goals we discussed: Goals    . Exercise 3x per week (30 min per time)       This is a list of the screening recommended for you and due dates:  Health Maintenance  Topic Date Due  . Tetanus Vaccine  03/23/2017  . Flu Shot  Completed  . Pneumonia vaccines  Completed

## 2017-05-27 NOTE — Progress Notes (Signed)
Subjective:   [name] [name] is a 76 y.o. male who presents for a subsequent Medicare Annual Wellness Visit. Mr Poehlman is married and lives at home with his wife. They had two sons but both passed away. He has 4 grandchildren and 3 great grandkids. He is part of a music group and enjoys entertaining. He is active around his home and walks daily.   Review of Systems  At this point his health is about the same as last year. Earlier this year he wasn't doing as well.   Cardiac Risk Factors include: advanced age (>67men, >67 women);hypertension;male gender    Other systems negative today.  Objective:    Today's Vitals   05/27/17 0908  BP: 138/74  Pulse: 78  Weight: 170 lb (77.1 kg)  Height: 5\' 8"  (1.727 m)   Body mass index is 25.85 kg/m.  Advanced Directives 05/27/2017 05/07/2017 03/30/2017 03/25/2017 02/20/2017 02/20/2017 02/20/2017  Does Patient Have a Medical Advance Directive? Yes Yes Yes Yes Yes Yes Yes  Type of Paramedic of [place];Living will Living will;Healthcare Power of [place];Living will Living will Living will - Living will  Does patient want to make changes to medical advance directive? No - Patient declined - No - Patient declined No - Patient declined No - Patient declined - No - Patient declined  Copy of [place] in Chart? No - copy requested No - copy requested No - copy requested - - - -  Would patient like information on creating a medical advance directive? - - - - - - -    Current Medications (verified) Outpatient Encounter Medications as of 05/27/2017  Medication Sig  . alfuzosin (UROXATRAL) 10 MG 24 hr tablet Take 10 mg by mouth at bedtime.   Marland Kitchen amLODipine (NORVASC) 5 MG tablet Take 1 tablet (5 mg total) by mouth daily.  Marland Kitchen aspirin EC 81 MG tablet Take 1 tablet (81 mg total) by mouth daily.  Marland Kitchen atorvastatin (LIPITOR) 40 MG tablet Take 1 tablet (40 mg total) by mouth daily.  .  cholecalciferol (VITAMIN D) 1000 UNITS tablet Take 1 tablet (1,000 Units total) by mouth daily.  . clopidogrel (PLAVIX) 75 MG tablet Take 1 tablet (75 mg total) by mouth daily with breakfast.  . ferrous gluconate (FERGON) 324 MG tablet Take 1 tablet (324 mg total) by mouth 2 (two) times daily with a meal.  . levothyroxine (SYNTHROID, LEVOTHROID) 75 MCG tablet Take 1 tablet (75 mcg total) by mouth daily.  Marland Kitchen lisinopril (PRINIVIL,ZESTRIL) 40 MG tablet Take 1 tablet (40 mg total) by mouth daily.  . metoprolol tartrate (LOPRESSOR) 25 MG tablet Take 0.5 tablets (12.5 mg total) by mouth 2 (two) times daily.  . Multiple Vitamin (ONE-A-DAY MENS PO) Take 1 tablet by mouth daily.    . Omega-3 Fatty Acids (FISH OIL) 1200 MG CAPS Take 2,400 mg by mouth daily.   . pantoprazole (PROTONIX) 40 MG tablet Take 1 tablet (40 mg total) by mouth 2 (two) times daily.   No facility-administered encounter medications on file as of 05/27/2017.     Allergies (verified) Eliquis [apixaban] and Lovenox [enoxaparin]   History: Past Medical History:  Diagnosis Date  . AAA (abdominal aortic aneurysm) ([place])   . Aortic stenosis, severe    a. 02/2017: s/p TAVR with Edwards Sapien 3 THV (size 26 mm, model # 9600TFX, serial # [id number])  . Arthritis   . Atherosclerotic peripheral vascular disease ([place])   . Atrial  fibrillation, persistent ([place])    a. not a candidate for [place] given hx of GI bleed  . Bilateral carotid artery stenosis   . COPD (chronic obstructive pulmonary disease) ([place])   . Hyperlipidemia   . Hypertension   . Hypothyroidism   . Lung cancer ([place])    Right  . Lung mass    Right  . Skin cancer   . Vitamin D deficiency    Past Surgical History:  Procedure Laterality Date  . ABDOMINAL AORTIC ANEURYSM REPAIR  08-09-09  . BACK SURGERY    . ESOPHAGOGASTRODUODENOSCOPY N/A 02/21/2017   Procedure: ESOPHAGOGASTRODUODENOSCOPY (EGD);  Surgeon: Jerene Bears, MD;  Location: [facility];  Service: Gastroenterology;   Laterality: N/A;  . HYDROCELE EXCISION / REPAIR    . LOBECTOMY Right 03/30/2017   Procedure: RIGHT UPPER LOBECTOMY LUNG;  Surgeon: Melrose Nakayama, MD;  Location: [facility];  Service: Thoracic;  Laterality: Right;  . SKIN CANCER EXCISION    . SPINE SURGERY    . TEE WITHOUT CARDIOVERSION N/A 02/24/2017   Procedure: TRANSESOPHAGEAL ECHOCARDIOGRAM (TEE);  Surgeon: Sherren Mocha, MD;  Location: [facility];  Service: Open Heart Surgery;  Laterality: N/A;  . TRANSCATHETER AORTIC VALVE REPLACEMENT, TRANSFEMORAL N/A 02/24/2017   Procedure: TRANSCATHETER AORTIC VALVE REPLACEMENT, TRANSFEMORAL using a 27mm Edwards Sapien 3 Aortic Valve;  Surgeon: Sherren Mocha, MD;  Location: [facility];  Service: Open Heart Surgery;  Laterality: N/A;  . TRANSURETHRAL RESECTION OF BLADDER TUMOR N/A 06/06/2016   Procedure: TRANSURETHRAL RESECTION OF BLADDER TUMOR (TURBT);  Surgeon: Franchot Gallo, MD;  Location: [facility];  Service: Urology;  Laterality: N/A;  . TRANSURETHRAL RESECTION OF BLADDER TUMOR N/A 07/22/2016   Procedure: TRANSURETHRAL RESECTION OF BLADDER TUMOR (TURBT);  Surgeon: Franchot Gallo, MD;  Location: [facility];  Service: Urology;  Laterality: N/A;  . VIDEO ASSISTED THORACOSCOPY (VATS)/WEDGE RESECTION Right 03/30/2017   Procedure: VIDEO ASSISTED THORACOSCOPY (VATS)/WEDGE RESECTION;  Surgeon: Melrose Nakayama, MD;  Location: [facility] OR;  Service: Thoracic;  Laterality: Right;   Family History  Problem Relation Age of Onset  . Heart disease Father        Heart Disease before age 68  . Alcohol abuse Father   . Heart attack Father   . Cancer Mother        Lung  . COPD Mother   . COPD Sister   . Lupus Sister   . Hypertension Brother   . Gout Brother   . Heart attack Son   . Gout Brother   . Prostatitis Brother    Social History   Socioeconomic History  . Marital status: Married    Spouse name: Lorry Anastasi  . Number of children: 2  . Years of education: Not on file  . Highest education level: Not on  file  Social Needs  . Financial resource strain: Not very hard  . Food insecurity - worry: Never true  . Food insecurity - inability: Never true  . Transportation needs - medical: No  . Transportation needs - non-medical: No  Occupational History  . Occupation: Retired    Comment: Librarian, academic with [employer]  Tobacco Use  . Smoking status: Former Smoker    Packs/day: 1.00    Years: 51.00    Pack years: 51.00    Types: Cigarettes    Start date: 11/25/1954    Last attempt to quit: 10/27/2005    Years since quitting: 11.5  . Smokeless tobacco: Never Used  Substance and Sexual Activity  . Alcohol use:  No    Alcohol/week: 0.0 oz    Comment: none since 1999  . Drug use: No  . Sexual activity: Yes  Other Topics Concern  . Not on file  Social History Narrative   2 sons that are deceased. 4 grandchildren and 3 great grandchildren. Lives at home with his wife.    Activities of Daily Living In your present state of health, do you have any difficulty performing the following activities: 05/27/2017 03/30/2017  Hearing? N N  Vision? N N  Comment Yen Le, OD. Overdue for exam -  Difficulty concentrating or making decisions? N N  Walking or climbing stairs? N N  Comment can navigate steps at home -  Dressing or bathing? N N  Doing errands, shopping? N N  Preparing Food and eating ? N -  Using the Toilet? N -  In the past six months, have you accidently leaked urine? N -  Do you have problems with loss of bowel control? N -  Managing your Medications? N -  Managing your Finances? N -  Housekeeping or managing your Housekeeping? N -  Some recent data might be hidden     Immunizations and Health Maintenance Immunization History  Administered Date(s) Administered  . Influenza Split 03/23/2013  . Influenza, High Dose Seasonal PF 03/06/2014  . Influenza,inj,Quad PF,6+ Mos 03/22/2015, 03/19/2017  . Influenza-Unspecified 03/22/2016  . Pneumococcal Conjugate-13 04/26/2013  .  Pneumococcal Polysaccharide-23 06/23/2004, 12/11/2014  . Td 03/24/2007   Health Maintenance Due  Topic Date Due  . TETANUS/TDAP  03/23/2017    Patient Care Team: [name], [place] as PCP - General (Nurse Practitioner) [name], FNP as Nurse Practitioner (Nurse Practitioner) Allyn Kenner, MD (Dermatology) Harlen Labs, MD as Referring Physician (Optometry) Suella Broad, MD as Consulting Physician (Orthopedic Surgery) Serafina Mitchell, MD as Consulting Physician (Vascular Surgery) Herminio Commons, MD as Attending Physician (Cardiology) Alyson Ingles Candee Furbish, MD as Consulting Physician (Urology) Sherren Mocha, MD as Consulting Physician (Cardiology) Franchot Gallo, MD as Consulting Physician (Urology) Melrose Nakayama, MD as Consulting Physician (Cardiothoracic Surgery)  3 surgeries this past year: Aortic valve, lung, and bladder    Assessment:   This is a routine wellness examination for [place].   Hearing/Vision screen No deficits noted during visit.   Dietary issues and exercise activities discussed: Current Exercise Habits: Home exercise routine, Type of exercise: walking, Time (Minutes): 30, Frequency (Times/Week): 7, Weekly Exercise (Minutes/Week): 210, Intensity: Mild, Exercise limited by: respiratory conditions(s)  Goals    . Exercise 3x per week (30 min per time)      Depression Screen PHQ 2/9 Scores 05/27/2017 10/20/2016 07/15/2016 05/26/2016  PHQ - 2 Score 0 0 0 0  PHQ- 9 Score - - - -    Fall Risk Fall Risk  05/27/2017 10/20/2016 07/15/2016 05/26/2016 04/14/2016  Falls in the past year? No No No No No    Cognitive Function: MMSE - Mini Mental State Exam 05/27/2017 05/26/2016 12/05/2014  Orientation to time 5 5 5   Orientation to Place 5 5 5   Registration 3 3 3   Attention/ Calculation 5 5 5   Recall 2 3 2   Language- name 2 objects 2 2 2   Language- repeat 1 1 1   Language- follow 3 step command 3 3 3   Language- read & follow  direction 1 1 1   Write a sentence 1 1 1   Copy design 1 1 1   Total score 29 30 29         Screening  Tests Health Maintenance  Topic Date Due  . TETANUS/TDAP  03/23/2017  . INFLUENZA VACCINE  Completed  . PNA vac Low Risk Adult  Completed    Tdap would cost $45 today. Patient opted to wait until next visit to check cost then.   Plan:  Continue to stay active. Aim for at least 150 min a week. Keep f/u with PCP Check on the price of Tdap at your next visit.  Move carefully to avoid falls.  I have personally reviewed and noted the following in the patient's chart:   . Medical and social history . Use of alcohol, tobacco or illicit drugs  . Current medications and supplements . Functional ability and status . Nutritional status . Physical activity . Advanced directives . List of other physicians . Hospitalizations, surgeries, and ER visits in previous 12 months . Vitals . Screenings to include cognitive, depression, and falls . Referrals and appointments  In addition, I have reviewed and discussed with patient certain preventive protocols, quality metrics, and best practice recommendations. A written personalized care plan for preventive services as well as general preventive health recommendations were provided to patient.     Chong Sicilian, RN   05/27/2017   I have reviewed and agree with the above AWV documentation.   [name] Hassell Done, FNP

## 2017-06-24 ENCOUNTER — Other Ambulatory Visit: Payer: Self-pay | Admitting: Nurse Practitioner

## 2017-06-24 DIAGNOSIS — E782 Mixed hyperlipidemia: Secondary | ICD-10-CM

## 2017-06-24 DIAGNOSIS — I10 Essential (primary) hypertension: Secondary | ICD-10-CM

## 2017-06-25 NOTE — Telephone Encounter (Signed)
OV 07/22/17

## 2017-07-14 ENCOUNTER — Ambulatory Visit: Payer: Medicare Other | Admitting: Urology

## 2017-07-14 DIAGNOSIS — C679 Malignant neoplasm of bladder, unspecified: Secondary | ICD-10-CM

## 2017-07-22 ENCOUNTER — Encounter: Payer: Self-pay | Admitting: Nurse Practitioner

## 2017-07-22 ENCOUNTER — Ambulatory Visit (INDEPENDENT_AMBULATORY_CARE_PROVIDER_SITE_OTHER): Payer: Medicare Other | Admitting: Nurse Practitioner

## 2017-07-22 VITALS — BP 135/72 | HR 81 | Temp 97.1°F | Ht 68.0 in | Wt 170.0 lb

## 2017-07-22 DIAGNOSIS — I6523 Occlusion and stenosis of bilateral carotid arteries: Secondary | ICD-10-CM

## 2017-07-22 DIAGNOSIS — I4819 Other persistent atrial fibrillation: Secondary | ICD-10-CM

## 2017-07-22 DIAGNOSIS — I350 Nonrheumatic aortic (valve) stenosis: Secondary | ICD-10-CM

## 2017-07-22 DIAGNOSIS — E034 Atrophy of thyroid (acquired): Secondary | ICD-10-CM | POA: Diagnosis not present

## 2017-07-22 DIAGNOSIS — N400 Enlarged prostate without lower urinary tract symptoms: Secondary | ICD-10-CM | POA: Diagnosis not present

## 2017-07-22 DIAGNOSIS — E559 Vitamin D deficiency, unspecified: Secondary | ICD-10-CM

## 2017-07-22 DIAGNOSIS — Z6826 Body mass index (BMI) 26.0-26.9, adult: Secondary | ICD-10-CM | POA: Diagnosis not present

## 2017-07-22 DIAGNOSIS — E782 Mixed hyperlipidemia: Secondary | ICD-10-CM

## 2017-07-22 DIAGNOSIS — J449 Chronic obstructive pulmonary disease, unspecified: Secondary | ICD-10-CM

## 2017-07-22 DIAGNOSIS — I10 Essential (primary) hypertension: Secondary | ICD-10-CM | POA: Diagnosis not present

## 2017-07-22 DIAGNOSIS — I481 Persistent atrial fibrillation: Secondary | ICD-10-CM | POA: Diagnosis not present

## 2017-07-22 DIAGNOSIS — I5031 Acute diastolic (congestive) heart failure: Secondary | ICD-10-CM | POA: Diagnosis not present

## 2017-07-22 MED ORDER — ATORVASTATIN CALCIUM 40 MG PO TABS: 40.0000 mg | ORAL_TABLET | Freq: Every day | ORAL | 0 refills | Status: DC

## 2017-07-22 MED ORDER — CLOPIDOGREL BISULFATE 75 MG PO TABS: 75.0000 mg | ORAL_TABLET | Freq: Every day | ORAL | 6 refills | Status: DC

## 2017-07-22 MED ORDER — PANTOPRAZOLE SODIUM 40 MG PO TBEC: 40.0000 mg | DELAYED_RELEASE_TABLET | Freq: Two times a day (BID) | ORAL | 6 refills | Status: DC

## 2017-07-22 MED ORDER — METOPROLOL TARTRATE 25 MG PO TABS: 12.5000 mg | ORAL_TABLET | Freq: Two times a day (BID) | ORAL | 6 refills | Status: DC

## 2017-07-22 MED ORDER — LISINOPRIL 40 MG PO TABS: 40.0000 mg | ORAL_TABLET | Freq: Every day | ORAL | 0 refills | Status: DC

## 2017-07-22 MED ORDER — AMLODIPINE BESYLATE 5 MG PO TABS: 5.0000 mg | ORAL_TABLET | Freq: Every day | ORAL | 0 refills | Status: DC

## 2017-07-22 MED ORDER — LEVOTHYROXINE SODIUM 75 MCG PO TABS: 75.0000 ug | ORAL_TABLET | Freq: Every day | ORAL | 8 refills | Status: DC

## 2017-07-22 NOTE — Patient Instructions (Signed)
DASH Eating Plan DASH stands for "Dietary Approaches to Stop Hypertension." The DASH eating plan is a healthy eating plan that has been shown to reduce high blood pressure (hypertension). It may also reduce your risk for type 2 diabetes, heart disease, and stroke. The DASH eating plan may also help with weight loss. What are tips for following this plan? General guidelines  Avoid eating more than 2,300 mg (milligrams) of salt (sodium) a day. If you have hypertension, you may need to reduce your sodium intake to 1,500 mg a day.  Limit alcohol intake to no more than 1 drink a day for nonpregnant women and 2 drinks a day for men. One drink equals 12 oz of beer, 5 oz of wine, or 1 oz of hard liquor.  Work with your health care provider to maintain a healthy body weight or to lose weight. Ask what an ideal weight is for you.  Get at least 30 minutes of exercise that causes your heart to beat faster (aerobic exercise) most days of the week. Activities may include walking, swimming, or biking.  Work with your health care provider or diet and nutrition specialist (dietitian) to adjust your eating plan to your individual calorie needs. Reading food labels  Check food labels for the amount of sodium per serving. Choose foods with less than 5 percent of the Daily Value of sodium. Generally, foods with less than 300 mg of sodium per serving fit into this eating plan.  To find whole grains, look for the word "whole" as the first word in the ingredient list. Shopping  Buy products labeled as "low-sodium" or "no salt added."  Buy fresh foods. Avoid canned foods and premade or frozen meals. Cooking  Avoid adding salt when cooking. Use salt-free seasonings or herbs instead of table salt or sea salt. Check with your health care provider or pharmacist before using salt substitutes.  Do not fry foods. Cook foods using healthy methods such as baking, boiling, grilling, and broiling instead.  Cook with  heart-healthy oils, such as olive, canola, soybean, or sunflower oil. Meal planning   Eat a balanced diet that includes: ? 5 or more servings of fruits and vegetables each day. At each meal, try to fill half of your plate with fruits and vegetables. ? Up to 6-8 servings of whole grains each day. ? Less than 6 oz of lean meat, poultry, or fish each day. A 3-oz serving of meat is about the same size as a deck of cards. One egg equals 1 oz. ? 2 servings of low-fat dairy each day. ? A serving of nuts, seeds, or beans 5 times each week. ? Heart-healthy fats. Healthy fats called Omega-3 fatty acids are found in foods such as flaxseeds and coldwater fish, like sardines, salmon, and mackerel.  Limit how much you eat of the following: ? Canned or prepackaged foods. ? Food that is high in trans fat, such as fried foods. ? Food that is high in saturated fat, such as fatty meat. ? Sweets, desserts, sugary drinks, and other foods with added sugar. ? Full-fat dairy products.  Do not salt foods before eating.  Try to eat at least 2 vegetarian meals each week.  Eat more home-cooked food and less restaurant, buffet, and fast food.  When eating at a restaurant, ask that your food be prepared with less salt or no salt, if possible. What foods are recommended? The items listed may not be a complete list. Talk with your dietitian about what   dietary choices are best for you. Grains Whole-grain or whole-wheat bread. Whole-grain or whole-wheat pasta. Brown rice. Oatmeal. Quinoa. Bulgur. Whole-grain and low-sodium cereals. Pita bread. Low-fat, low-sodium crackers. Whole-wheat flour tortillas. Vegetables Fresh or frozen vegetables (raw, steamed, roasted, or grilled). Low-sodium or reduced-sodium tomato and vegetable juice. Low-sodium or reduced-sodium tomato sauce and tomato paste. Low-sodium or reduced-sodium canned vegetables. Fruits All fresh, dried, or frozen fruit. Canned fruit in natural juice (without  added sugar). Meat and other protein foods Skinless chicken or turkey. Ground chicken or turkey. Pork with fat trimmed off. Fish and seafood. Egg whites. Dried beans, peas, or lentils. Unsalted nuts, nut butters, and seeds. Unsalted canned beans. Lean cuts of beef with fat trimmed off. Low-sodium, lean deli meat. Dairy Low-fat (1%) or fat-free (skim) milk. Fat-free, low-fat, or reduced-fat cheeses. Nonfat, low-sodium ricotta or cottage cheese. Low-fat or nonfat yogurt. Low-fat, low-sodium cheese. Fats and oils Soft margarine without trans fats. Vegetable oil. Low-fat, reduced-fat, or light mayonnaise and salad dressings (reduced-sodium). Canola, safflower, olive, soybean, and sunflower oils. Avocado. Seasoning and other foods Herbs. Spices. Seasoning mixes without salt. Unsalted popcorn and pretzels. Fat-free sweets. What foods are not recommended? The items listed may not be a complete list. Talk with your dietitian about what dietary choices are best for you. Grains Baked goods made with fat, such as croissants, muffins, or some breads. Dry pasta or rice meal packs. Vegetables Creamed or fried vegetables. Vegetables in a cheese sauce. Regular canned vegetables (not low-sodium or reduced-sodium). Regular canned tomato sauce and paste (not low-sodium or reduced-sodium). Regular tomato and vegetable juice (not low-sodium or reduced-sodium). Pickles. Olives. Fruits Canned fruit in a light or heavy syrup. Fried fruit. Fruit in cream or butter sauce. Meat and other protein foods Fatty cuts of meat. Ribs. Fried meat. Bacon. Sausage. Bologna and other processed lunch meats. Salami. Fatback. Hotdogs. Bratwurst. Salted nuts and seeds. Canned beans with added salt. Canned or smoked fish. Whole eggs or egg yolks. Chicken or turkey with skin. Dairy Whole or 2% milk, cream, and half-and-half. Whole or full-fat cream cheese. Whole-fat or sweetened yogurt. Full-fat cheese. Nondairy creamers. Whipped toppings.  Processed cheese and cheese spreads. Fats and oils Butter. Stick margarine. Lard. Shortening. Ghee. Bacon fat. Tropical oils, such as coconut, palm kernel, or palm oil. Seasoning and other foods Salted popcorn and pretzels. Onion salt, garlic salt, seasoned salt, table salt, and sea salt. Worcestershire sauce. Tartar sauce. Barbecue sauce. Teriyaki sauce. Soy sauce, including reduced-sodium. Steak sauce. Canned and packaged gravies. Fish sauce. Oyster sauce. Cocktail sauce. Horseradish that you find on the shelf. Ketchup. Mustard. Meat flavorings and tenderizers. Bouillon cubes. Hot sauce and Tabasco sauce. Premade or packaged marinades. Premade or packaged taco seasonings. Relishes. Regular salad dressings. Where to find more information:  National Heart, Lung, and Blood Institute: www.nhlbi.nih.gov  American Heart Association: www.heart.org Summary  The DASH eating plan is a healthy eating plan that has been shown to reduce high blood pressure (hypertension). It may also reduce your risk for type 2 diabetes, heart disease, and stroke.  With the DASH eating plan, you should limit salt (sodium) intake to 2,300 mg a day. If you have hypertension, you may need to reduce your sodium intake to 1,500 mg a day.  When on the DASH eating plan, aim to eat more fresh fruits and vegetables, whole grains, lean proteins, low-fat dairy, and heart-healthy fats.  Work with your health care provider or diet and nutrition specialist (dietitian) to adjust your eating plan to your individual   calorie needs. This information is not intended to replace advice given to you by your health care provider. Make sure you discuss any questions you have with your health care provider. Document Released: 05/29/2011 Document Revised: 06/02/2016 Document Reviewed: 06/02/2016 Elsevier Interactive Patient Education  2018 [employer]  

## 2017-07-22 NOTE — Progress Notes (Signed)
Subjective:    Patient ID: [name] [name], male    DOB: 02-04-1941, 77 y.o.   MRN: [id number]  HPI   TANDRE CONLY is here today for follow up of chronic medical problem.  Outpatient Encounter Medications as of 07/22/2017  Medication Sig  . alfuzosin (UROXATRAL) 10 MG 24 hr tablet Take 10 mg by mouth at bedtime.   Marland Kitchen amLODipine (NORVASC) 5 MG tablet Take 1 tablet (5 mg total) by mouth daily.  Marland Kitchen aspirin EC 81 MG tablet Take 1 tablet (81 mg total) by mouth daily.  Marland Kitchen atorvastatin (LIPITOR) 40 MG tablet Take 1 tablet (40 mg total) by mouth daily.  . cholecalciferol (VITAMIN D) 1000 UNITS tablet Take 1 tablet (1,000 Units total) by mouth daily.  . clopidogrel (PLAVIX) 75 MG tablet Take 1 tablet (75 mg total) by mouth daily with breakfast.  . ferrous gluconate (FERGON) 324 MG tablet Take 1 tablet (324 mg total) by mouth 2 (two) times daily with a meal.  . levothyroxine (SYNTHROID, LEVOTHROID) 75 MCG tablet Take 1 tablet (75 mcg total) by mouth daily.  Marland Kitchen lisinopril (PRINIVIL,ZESTRIL) 40 MG tablet Take 1 tablet (40 mg total) by mouth daily.  . metoprolol tartrate (LOPRESSOR) 25 MG tablet Take 0.5 tablets (12.5 mg total) by mouth 2 (two) times daily.  . Multiple Vitamin (ONE-A-DAY MENS PO) Take 1 tablet by mouth daily.    . Omega-3 Fatty Acids (FISH OIL) 1200 MG CAPS Take 2,400 mg by mouth daily.   . pantoprazole (PROTONIX) 40 MG tablet Take 1 tablet (40 mg total) by mouth 2 (two) times daily.     1. Nonrheumatic aortic valve stenosis  Had surgery in October for replacement. Is doing well  2. Essential hypertension  No c/o chest pain, sob or headache. Does not check blood pressure at home. BP Readings from Last 3 Encounters:  07/22/17 135/72  05/27/17 138/74  05/20/17 122/68     3. Persistent atrial fibrillation (HCC)  Taking plavix and baby aspirin  4. Bilateral carotid artery stenosis  Had checked while in hospital and they are stable right now  5. Acute diastolic heart  failure (HCC)  deneis any swelling  6. Chronic obstructive pulmonary disease, unspecified COPD type (HCC) No cough- denies sob   7. Hypothyroidism due to acquired atrophy of thyroid  No problems that he is aware of  8. Benign prostatic hyperplasia without lower urinary tract symptoms  Currently not symptomatic  9. BMI 26.0-26.9,adult  Weight stable  10. Mixed hyperlipidemia  Watches diet closely since had surgery  11. Vitamin D deficiency  Takes OTC daily  12.    Bladder cancer          Has 3 more treatments and will be done 13.    Right lung lobectomy          Had lobectomy in November- is doing well and they are telling him he is                    cancer free. Did not hav e to do chemo or radiation.  New complaints: None today  Social history: Wife still living   Review of Systems  Constitutional: Negative for activity change and appetite change.  HENT: Negative.   Eyes: Negative for pain.  Respiratory: Negative for shortness of breath.   Cardiovascular: Negative for chest pain, palpitations and leg swelling.  Gastrointestinal: Negative for abdominal pain.  Endocrine: Negative for polydipsia.  Genitourinary: Negative.   Skin:  Negative for rash.  Neurological: Negative for dizziness, weakness and headaches.  Hematological: Does not bruise/bleed easily.  Psychiatric/Behavioral: Negative.   All other systems reviewed and are negative.      Objective:   Physical Exam  Constitutional: He is oriented to person, place, and time. He appears well-developed and well-nourished.  HENT:  Head: Normocephalic.  Right Ear: External ear normal.  Left Ear: External ear normal.  Nose: Nose normal.  Mouth/Throat: Oropharynx is clear and moist.  Eyes: EOM are normal. Pupils are equal, round, and reactive to light.  Neck: Normal range of motion. Neck supple. No JVD present. No thyromegaly present.  Cardiovascular: Normal rate, regular rhythm, normal heart sounds and intact distal  pulses. Exam reveals no gallop and no friction rub.  No murmur heard. Right 3/6 carotid bruit  Pulmonary/Chest: Effort normal and breath sounds normal. No respiratory distress. He has no wheezes. He has no rales. He exhibits no tenderness.  Abdominal: Soft. Bowel sounds are normal. He exhibits no mass. There is no tenderness.  Musculoskeletal: Normal range of motion. He exhibits no edema.  Lymphadenopathy:    He has no cervical adenopathy.  Neurological: He is alert and oriented to person, place, and time. No cranial nerve deficit.  Skin: Skin is warm and dry.  Psychiatric: He has a normal mood and affect. His behavior is normal. Judgment and thought content normal.   BP 135/72   Pulse 81   Temp (!) 97.1 F (36.2 C) (Oral)   Ht 5' 8"  (1.727 m)   Wt 170 lb (77.1 kg)   BMI 25.85 kg/m        Assessment & Plan:  1. Nonrheumatic aortic valve stenosis Had valve replacement  2. Essential hypertension Low sodium diet - amLODipine (NORVASC) 5 MG tablet; Take 1 tablet (5 mg total) by mouth daily.  Dispense: 30 tablet; Refill: 0 - CMP14+EGFR  3. Persistent atrial fibrillation ([place])  4. Bilateral carotid artery stenosis Keep follow up with cardiology - clopidogrel (PLAVIX) 75 MG tablet; Take 1 tablet (75 mg total) by mouth daily with breakfast.  Dispense: 30 tablet; Refill: 6  5. Acute diastolic heart failure (HCC) - metoprolol tartrate (LOPRESSOR) 25 MG tablet; Take 0.5 tablets (12.5 mg total) by mouth 2 (two) times daily.  Dispense: 30 tablet; Refill: 6  6. Chronic obstructive pulmonary disease, unspecified COPD type ([place]) Avoid cigarette smoke  7. Hypothyroidism due to acquired atrophy of thyroid - levothyroxine (SYNTHROID, LEVOTHROID) 75 MCG tablet; Take 1 tablet (75 mcg total) by mouth daily.  Dispense: 30 tablet; Refill: 8 - Thyroid Panel With TSH  8. Benign prostatic hyperplasia without lower urinary tract symptoms Keep follow up with urology  9. BMI  26.0-26.9,adult Discussed diet and exercise for person with BMI >25 Will recheck weight in 3-6 months  10. Mixed hyperlipidemia Low fat diet - lisinopril (PRINIVIL,ZESTRIL) 40 MG tablet; Take 1 tablet (40 mg total) by mouth daily.  Dispense: 30 tablet; Refill: 0 - atorvastatin (LIPITOR) 40 MG tablet; Take 1 tablet (40 mg total) by mouth daily.  Dispense: 30 tablet; Refill: 0 - Lipid panel  11. Vitamin D deficiency Continue vitamin d OTC    Labs pending Health maintenance reviewed Diet and exercise encouraged Continue all meds Follow up  In 3 months   [place], FNP

## 2017-07-23 LAB — CMP14+EGFR
ALT: 16 IU/L (ref 0–44)
AST: 25 IU/L (ref 0–40)
Albumin/Globulin Ratio: 1.2 (ref 1.2–2.2)
Albumin: 4.2 g/dL (ref 3.5–4.8)
Alkaline Phosphatase: 98 IU/L (ref 39–117)
BUN/Creatinine Ratio: 14 (ref 10–24)
BUN: 15 mg/dL (ref 8–27)
Bilirubin Total: 0.5 mg/dL (ref 0.0–1.2)
CALCIUM: 9.3 mg/dL (ref 8.6–10.2)
CO2: 23 mmol/L (ref 20–29)
CREATININE: 1.06 mg/dL (ref 0.76–1.27)
Chloride: 105 mmol/L (ref 96–106)
GFR calc Af Amer: 78 mL/min/{1.73_m2} (ref >59)
GFR, EST NON AFRICAN AMERICAN: 67 mL/min/{1.73_m2} (ref >59)
Globulin, Total: 3.5 g/dL (ref 1.5–4.5)
Glucose: 103 mg/dL — ABNORMAL HIGH (ref 65–99)
Potassium: 4.5 mmol/L (ref 3.5–5.2)
Sodium: 142 mmol/L (ref 134–144)
Total Protein: 7.7 g/dL (ref 6.0–8.5)

## 2017-07-23 LAB — LIPID PANEL
CHOLESTEROL TOTAL: 99 mg/dL — AB (ref 100–199)
Chol/HDL Ratio: 4 ratio (ref 0.0–5.0)
HDL: 25 mg/dL — ABNORMAL LOW (ref >39)
LDL Calculated: 53 mg/dL (ref 0–99)
Triglycerides: 103 mg/dL (ref 0–149)
VLDL CHOLESTEROL CAL: 21 mg/dL (ref 5–40)

## 2017-07-23 LAB — THYROID PANEL WITH TSH
Free Thyroxine Index: 1.2 (ref 1.2–4.9)
T3 UPTAKE RATIO: 26 % (ref 24–39)
T4 TOTAL: 4.5 ug/dL (ref 4.5–12.0)
TSH: 4.07 u[IU]/mL (ref 0.450–4.500)

## 2017-07-24 ENCOUNTER — Other Ambulatory Visit: Payer: Self-pay | Admitting: Nurse Practitioner

## 2017-07-24 DIAGNOSIS — I10 Essential (primary) hypertension: Secondary | ICD-10-CM

## 2017-07-24 DIAGNOSIS — E782 Mixed hyperlipidemia: Secondary | ICD-10-CM

## 2017-07-28 ENCOUNTER — Ambulatory Visit (INDEPENDENT_AMBULATORY_CARE_PROVIDER_SITE_OTHER): Payer: Medicare Other | Admitting: Physician Assistant

## 2017-07-28 ENCOUNTER — Encounter: Payer: Self-pay | Admitting: Physician Assistant

## 2017-07-28 ENCOUNTER — Ambulatory Visit (INDEPENDENT_AMBULATORY_CARE_PROVIDER_SITE_OTHER): Payer: Medicare Other | Admitting: Urology

## 2017-07-28 DIAGNOSIS — J40 Bronchitis, not specified as acute or chronic: Secondary | ICD-10-CM

## 2017-07-28 DIAGNOSIS — C678 Malignant neoplasm of overlapping sites of bladder: Secondary | ICD-10-CM | POA: Diagnosis not present

## 2017-07-28 MED ORDER — PREDNISONE 10 MG (21) PO TBPK: ORAL_TABLET | ORAL | 0 refills | Status: DC

## 2017-07-28 MED ORDER — DOXYCYCLINE HYCLATE 100 MG PO TABS: 100.0000 mg | ORAL_TABLET | Freq: Two times a day (BID) | ORAL | 0 refills | Status: DC

## 2017-07-28 NOTE — Progress Notes (Signed)
BP 119/78   Pulse 82   Temp 98.2 F (36.8 C) (Oral)   Ht 5\' 8"  (1.727 m)   Wt 165 lb 12.8 oz (75.2 kg)   SpO2 97%   BMI 25.21 kg/m    Subjective:    Patient ID: [name] [name], male    DOB: 06-Jun-1941, 77 y.o.   MRN: [id number]  HPI: [name] [name] is a 77 y.o. male presenting on 07/28/2017 for head congestion and chest congestion  Patient with several days of progressing upper respiratory and bronchial symptoms. Initially there was more upper respiratory congestion. This progressed to having significant cough that is productive throughout the day and severe at night. There is occasional wheezing after coughing. Sometimes there is slight dyspnea on exertion. It is productive mucus that is yellow in color. Denies any blood.  Past Medical History:  Diagnosis Date  . AAA (abdominal aortic aneurysm) ([place])   . Aortic stenosis, severe    a. 02/2017: s/p TAVR with Edwards Sapien 3 THV (size 26 mm, model # 9600TFX, serial # [id number])  . Arthritis   . Atherosclerotic peripheral vascular disease ([place])   . Atrial fibrillation, persistent ([place])    a. not a candidate for [place] given hx of GI bleed  . Bilateral carotid artery stenosis   . COPD (chronic obstructive pulmonary disease) ([place])   . Hyperlipidemia   . Hypertension   . Hypothyroidism   . Lung cancer ([name])    Right  . Lung mass    Right  . Skin cancer   . Vitamin D deficiency    Relevant past medical, surgical, family and social history reviewed and updated as indicated. Interim medical history since our last visit reviewed. Allergies and medications reviewed and updated. DATA REVIEWED: CHART IN EPIC  Family History reviewed for pertinent findings.  Review of Systems  Constitutional: Positive for fatigue. Negative for appetite change and fever.  HENT: Positive for congestion, sinus pressure and sore throat.   Eyes: Negative.  Negative for pain and visual disturbance.  Respiratory: Positive for cough and wheezing.  Negative for chest tightness and shortness of breath.   Cardiovascular: Negative.  Negative for chest pain, palpitations and leg swelling.  Gastrointestinal: Negative.  Negative for abdominal pain, diarrhea, nausea and vomiting.  Endocrine: Negative.   Genitourinary: Negative.   Musculoskeletal: Positive for back pain and myalgias.  Skin: Negative.  Negative for color change and rash.  Neurological: Positive for headaches. Negative for weakness and numbness.  Psychiatric/Behavioral: Negative.     Allergies as of 07/28/2017      Reactions   Eliquis [apixaban] Other (See Comments)   Eliquis and lovenox caused his " ulcer to bleed."   Lovenox [enoxaparin]    HIT panel ordered 10/10      Medication List        Accurate as of 07/28/17  2:13 PM. Always use your most recent med list.          alfuzosin 10 MG 24 hr tablet Commonly known as:  UROXATRAL Take 10 mg by mouth at bedtime.   amLODipine 5 MG tablet Commonly known as:  NORVASC Take 1 tablet (5 mg total) by mouth daily.   aspirin EC 81 MG tablet Take 1 tablet (81 mg total) by mouth daily.   atorvastatin 40 MG tablet Commonly known as:  LIPITOR Take 1 tablet (40 mg total) by mouth daily.   cholecalciferol 1000 units tablet Commonly known as:  VITAMIN D Take 1 tablet (1,000 Units  total) by mouth daily.   clopidogrel 75 MG tablet Commonly known as:  PLAVIX Take 1 tablet (75 mg total) by mouth daily with breakfast.   doxycycline 100 MG tablet Commonly known as:  VIBRA-TABS Take 1 tablet (100 mg total) by mouth 2 (two) times daily.   ferrous gluconate 324 MG tablet Commonly known as:  FERGON Take 1 tablet (324 mg total) by mouth 2 (two) times daily with a meal.   Fish Oil 1200 MG Caps Take 2,400 mg by mouth daily.   levothyroxine 75 MCG tablet Commonly known as:  SYNTHROID, LEVOTHROID Take 1 tablet (75 mcg total) by mouth daily.   lisinopril 40 MG tablet Commonly known as:  PRINIVIL,ZESTRIL Take 1 tablet (40  mg total) by mouth daily.   metoprolol tartrate 25 MG tablet Commonly known as:  LOPRESSOR Take 0.5 tablets (12.5 mg total) by mouth 2 (two) times daily.   ONE-A-DAY MENS PO Take 1 tablet by mouth daily.   pantoprazole 40 MG tablet Commonly known as:  PROTONIX Take 1 tablet (40 mg total) by mouth 2 (two) times daily.   predniSONE 10 MG (21) Tbpk tablet Commonly known as:  STERAPRED UNI-PAK 21 TAB As directed x 6 days          Objective:    BP 119/78   Pulse 82   Temp 98.2 F (36.8 C) (Oral)   Ht 5\' 8"  (1.727 m)   Wt 165 lb 12.8 oz (75.2 kg)   SpO2 97%   BMI 25.21 kg/m   Allergies  Allergen Reactions  . Eliquis [Apixaban] Other (See Comments)    Eliquis and lovenox caused his " ulcer to bleed."  . Lovenox [Enoxaparin]     HIT panel ordered 10/10    Wt Readings from Last 3 Encounters:  07/28/17 165 lb 12.8 oz (75.2 kg)  07/22/17 170 lb (77.1 kg)  05/27/17 170 lb (77.1 kg)    Physical Exam  Constitutional: He appears well-developed and well-nourished.  HENT:  Head: Normocephalic and atraumatic.  Right Ear: Hearing and tympanic membrane normal.  Left Ear: Hearing and tympanic membrane normal.  Nose: Mucosal edema and sinus tenderness present. No nasal deformity. Right sinus exhibits frontal sinus tenderness. Left sinus exhibits frontal sinus tenderness.  Mouth/Throat: Posterior oropharyngeal erythema present.  Eyes: Conjunctivae and EOM are normal. Pupils are equal, round, and reactive to light. Right eye exhibits no discharge. Left eye exhibits no discharge.  Neck: Normal range of motion. Neck supple.  Cardiovascular: Normal rate, regular rhythm and normal heart sounds.  Pulmonary/Chest: Effort normal. No respiratory distress. He has no decreased breath sounds. He has wheezes. He has no rhonchi. He has no rales.  Abdominal: Soft. Bowel sounds are normal.  Musculoskeletal: Normal range of motion.  Skin: Skin is warm and dry.        Assessment & Plan:   1.  Bronchitis - doxycycline (VIBRA-TABS) 100 MG tablet; Take 1 tablet (100 mg total) by mouth 2 (two) times daily.  Dispense: 20 tablet; Refill: 0 - predniSONE (STERAPRED UNI-PAK 21 TAB) 10 MG (21) TBPK tablet; As directed x 6 days  Dispense: 21 tablet; Refill: 0  Continue all other maintenance medications as listed above.  Follow up plan:   Educational handout given for [place] PA-C [facility] [address]  [place], [place] [phone]   07/28/2017, 2:13 PM

## 2017-07-28 NOTE — Patient Instructions (Signed)
Robitussin cough/congest  Delsym cough  Tylenol fever

## 2017-08-03 ENCOUNTER — Encounter: Payer: Self-pay | Admitting: Family

## 2017-08-03 ENCOUNTER — Ambulatory Visit: Payer: Medicare Other | Admitting: Family

## 2017-08-03 ENCOUNTER — Ambulatory Visit (HOSPITAL_COMMUNITY)
Admission: RE | Admit: 2017-08-03 | Discharge: 2017-08-03 | Disposition: A | Discharge status: Home / Self Care | Payer: Medicare Other | Source: Ambulatory Visit | Attending: Family | Admitting: Family

## 2017-08-03 VITALS — BP 140/92 | HR 85 | Temp 97.0°F | Resp 17 | Ht 69.0 in | Wt 164.4 lb

## 2017-08-03 DIAGNOSIS — I714 Abdominal aortic aneurysm, without rupture, unspecified: Secondary | ICD-10-CM

## 2017-08-03 DIAGNOSIS — Z95828 Presence of other vascular implants and grafts: Secondary | ICD-10-CM

## 2017-08-03 DIAGNOSIS — Z48812 Encounter for surgical aftercare following surgery on the circulatory system: Secondary | ICD-10-CM | POA: Diagnosis not present

## 2017-08-03 DIAGNOSIS — I6523 Occlusion and stenosis of bilateral carotid arteries: Secondary | ICD-10-CM | POA: Diagnosis not present

## 2017-08-03 NOTE — Progress Notes (Signed)
[facility]  CC: Follow up s/p Endovascular Repair of Abdominal Aortic Aneurysm    History of Present Illness  [name] [name] is a 77 y.o. ([birth year]) male who is status post aortic stent graft repair in February 2011 by Dr. Trula Slade. He also has mild extracranial carotid artery stenosis.   He returns for routine follow up.  The patient has not had new back or abdominal pain. He does have mild lumbar disc issues with a history of lumbar spine surgery. He denies claudication symptoms with walking, denies non-healing wounds other than addressed skin cancer on his shoulder which has healed well.  He is regularly physically active and watches his diet.  He denies any history of stroke or TIA symptoms.  Had aortic valve replaced in 2018. Had lung and bladder cancer surgery in 2018.   Pt Diabetic: No Pt smoker: former smoker, quit 2007  Pt meds include: Statin : Yes ASA: yes Other anticoagulants/antiplatelets: Plavix, had GI bleed from Eliquis   Past Medical History:  Diagnosis Date  . AAA (abdominal aortic aneurysm) ([place])   . Aortic stenosis, severe    a. 02/2017: s/p TAVR with Edwards Sapien 3 THV (size 26 mm, model # 9600TFX, serial # [id number])  . Arthritis   . Atherosclerotic peripheral vascular disease ([place])   . Atrial fibrillation, persistent ([place])    a. not a candidate for [place] given hx of GI bleed  . Bilateral carotid artery stenosis   . COPD (chronic obstructive pulmonary disease) ([place])   . Hyperlipidemia   . Hypertension   . Hypothyroidism   . Lung cancer ([place])    Right  . Lung mass    Right  . Skin cancer   . Vitamin D deficiency    Past Surgical History:  Procedure Laterality Date  . ABDOMINAL AORTIC ANEURYSM REPAIR  08-09-09  . BACK SURGERY    . ESOPHAGOGASTRODUODENOSCOPY N/A 02/21/2017   Procedure: ESOPHAGOGASTRODUODENOSCOPY (EGD);  Surgeon: Jerene Bears, MD;  Location: [facility] ENDOSCOPY;  Service: Gastroenterology;   Laterality: N/A;  . HYDROCELE EXCISION / REPAIR    . LOBECTOMY Right 03/30/2017   Procedure: RIGHT UPPER LOBECTOMY LUNG;  Surgeon: Melrose Nakayama, MD;  Location: [place] [facility];  Service: Thoracic;  Laterality: Right;  . SKIN CANCER EXCISION    . SPINE SURGERY    . TEE WITHOUT CARDIOVERSION N/A 02/24/2017   Procedure: TRANSESOPHAGEAL ECHOCARDIOGRAM (TEE);  Surgeon: Sherren Mocha, MD;  Location: [facility];  Service: Open Heart Surgery;  Laterality: N/A;  . TRANSCATHETER AORTIC VALVE REPLACEMENT, TRANSFEMORAL N/A 02/24/2017   Procedure: TRANSCATHETER AORTIC VALVE REPLACEMENT, TRANSFEMORAL using a 34mm Edwards Sapien 3 Aortic Valve;  Surgeon: Sherren Mocha, MD;  Location: [place];  Service: Open Heart Surgery;  Laterality: N/A;  . TRANSURETHRAL RESECTION OF BLADDER TUMOR N/A 06/06/2016   Procedure: TRANSURETHRAL RESECTION OF BLADDER TUMOR (TURBT);  Surgeon: Franchot Gallo, MD;  Location: [facility];  Service: Urology;  Laterality: N/A;  . TRANSURETHRAL RESECTION OF BLADDER TUMOR N/A 07/22/2016   Procedure: TRANSURETHRAL RESECTION OF BLADDER TUMOR (TURBT);  Surgeon: Franchot Gallo, MD;  Location: [facility];  Service: Urology;  Laterality: N/A;  . VIDEO ASSISTED THORACOSCOPY (VATS)/WEDGE RESECTION Right 03/30/2017   Procedure: VIDEO ASSISTED THORACOSCOPY (VATS)/WEDGE RESECTION;  Surgeon: Melrose Nakayama, MD;  [facility];  Service: Thoracic;  Laterality: Right;   Social History Social History   Tobacco Use  . Smoking status: Former Smoker    Packs/day: 1.00    Years: 51.00  Pack years: 51.00    Types: Cigarettes    Start date: 11/25/1954    Last attempt to quit: 10/27/2005    Years since quitting: 11.7  . Smokeless tobacco: Never Used  Substance Use Topics  . Alcohol use: No    Alcohol/week: 0.0 oz    Comment: none since 1999  . Drug use: No   Family History Family History  Problem Relation Age of Onset  . Heart disease Father        Heart Disease before age 62  . Alcohol abuse Father    . Heart attack Father   . Cancer Mother        Lung  . COPD Mother   . COPD Sister   . Lupus Sister   . Hypertension Brother   . Gout Brother   . Heart attack Son   . Gout Brother   . Prostatitis Brother    Current Outpatient Medications on File Prior to Visit  Medication Sig Dispense Refill  . alfuzosin (UROXATRAL) 10 MG 24 hr tablet Take 10 mg by mouth at bedtime.     Marland Kitchen amLODipine (NORVASC) 5 MG tablet Take 1 tablet (5 mg total) by mouth daily. 30 tablet 0  . aspirin EC 81 MG tablet Take 1 tablet (81 mg total) by mouth daily. 90 tablet 3  . atorvastatin (LIPITOR) 40 MG tablet Take 1 tablet (40 mg total) by mouth daily. 30 tablet 0  . cholecalciferol (VITAMIN D) 1000 UNITS tablet Take 1 tablet (1,000 Units total) by mouth daily. 30 tablet 6  . clopidogrel (PLAVIX) 75 MG tablet Take 1 tablet (75 mg total) by mouth daily with breakfast. 30 tablet 6  . doxycycline (VIBRA-TABS) 100 MG tablet Take 1 tablet (100 mg total) by mouth 2 (two) times daily. 20 tablet 0  . ferrous gluconate (FERGON) 324 MG tablet Take 1 tablet (324 mg total) by mouth 2 (two) times daily with a meal. 60 tablet 6  . levothyroxine (SYNTHROID, LEVOTHROID) 75 MCG tablet Take 1 tablet (75 mcg total) by mouth daily. 30 tablet 8  . lisinopril (PRINIVIL,ZESTRIL) 40 MG tablet Take 1 tablet (40 mg total) by mouth daily. 30 tablet 0  . metoprolol tartrate (LOPRESSOR) 25 MG tablet Take 0.5 tablets (12.5 mg total) by mouth 2 (two) times daily. 30 tablet 6  . Multiple Vitamin (ONE-A-DAY MENS PO) Take 1 tablet by mouth daily.      . Omega-3 Fatty Acids (FISH OIL) 1200 MG CAPS Take 2,400 mg by mouth daily.     . pantoprazole (PROTONIX) 40 MG tablet Take 1 tablet (40 mg total) by mouth 2 (two) times daily. 60 tablet 6  . predniSONE (STERAPRED UNI-PAK 21 TAB) 10 MG (21) TBPK tablet As directed x 6 days 21 tablet 0   No current facility-administered medications on file prior to visit.    Allergies  Allergen Reactions  . Eliquis  [Apixaban] Other (See Comments)    Eliquis and lovenox caused his " ulcer to bleed."  . Lovenox [Enoxaparin]     HIT panel ordered 10/10     ROS: See HPI for pertinent positives and negatives.  Physical Examination  Vitals:   08/03/17 0837 08/03/17 0839  BP: (!) 140/91 (!) 140/92  Pulse: 85   Resp: 17   Temp: (!) 97 F (36.1 C)   TempSrc: Oral   SpO2: 97%   Weight: 164 lb 6.4 oz (74.6 kg)   Height: 5\' 9"  (1.753 m)    Body mass  index is 24.28 kg/m.  General:A&O x 3, WD male. HENT: No gross abnormalities  Eyes: PERRLA  Pulmonary: Sym exp, respirations are non labored, good air movement in all fields, CTAB, no rales, rhonchi, or wheezing. Cardiac: RRR, no detected murmur.   Vascular: Vessel Right Left  Radial 2+Palpable 2+Palpable  Carotid No bruit, +palpable No bruit, +palpable  Aorta Not palpable N/A  Femoral Palpable Palpable  Popliteal Not palpable Not palpable  PT Not palpable Not Palpable  DP Not Palpable Not Palpable   Gastrointestinal: soft, NTND, -G/R, - HSM, - palpable masses, - CVAT B. Musculoskeletal: M/S 5/5 throughout, Extremities without ischemic changes. Dermatologic: No rash, no cellulitis, no ulcers noted  Neurologic: Pain and light touch intact in extremities, Motor exam as listed above Psychiatric: Normal thought content, mood appropriate for clinical situation.     DATA  EVAR Duplex (Date: 08/03/17:)  AAA sac size: 3.3 cm; Right CIA: 1.5 cm; Left CIA: 1.5 cm  no endoleak detected  Previous (07-28-16): 3.98 cm, Right CIA: 1.15; Left CIA: 1.19.  no endoleak detected. Technically difficult and limited exam due to bowel gas.    Carotid Duplex 07-28-2016: <40% bilateral ICA stenosis. Bilateral vertebral artery flow is antegrade.  Bilateral subclavian artery waveforms are normal.  No significant change compared to the last exam on 07-23-15.  ABI (Date: 07/23/2015):  R: 1.12 (no previous ABI),  DP: tri, PT: tri, TBI: 0.79  L: 1.14 (N/A), DP: tri, PT: tri, TBI: 0.84    Medical Decision Making  DORSEL FLINN is a 77 y.o. male who presents s/p EVAR (Date: February 2011).  Pt is asymptomatic with a decrease in sac size to 3.3 cm today from 3.98 cm a year ago.   The next endograft duplex will be scheduled for 12 months.  The patient will follow up with [place] in 12 months with these studies.  Carotid duplex in 2 years.   I discussed with the patient the importance of surveillance of the endograft.  I emphasized the importance of maximal medical management including strict control of blood pressure, blood glucose, and lipid levels, antiplatelet agents, obtaining regular exercise, and cessation of smoking.   Thank you for allowing [place] to participate in this patient's care.  Clemon Chambers, RN, MSN, FNP-C [facility] Office: [facility] Physician: Trula Slade  08/03/2017, 9:23 AM

## 2017-08-03 NOTE — Patient Instructions (Signed)
Before your next abdominal ultrasound:  Take two Extra-Strength Gas-X capsules at bedtime the night before the test. Take another two Extra-Strength Gas-X capsules 3 hours before the test.  Avoid gas forming foods the day before the test.       

## 2017-08-04 ENCOUNTER — Ambulatory Visit (INDEPENDENT_AMBULATORY_CARE_PROVIDER_SITE_OTHER): Payer: Medicare Other | Admitting: Urology

## 2017-08-04 DIAGNOSIS — C678 Malignant neoplasm of overlapping sites of bladder: Secondary | ICD-10-CM

## 2017-08-11 ENCOUNTER — Ambulatory Visit: Payer: Medicare Other | Admitting: Urology

## 2017-08-24 ENCOUNTER — Other Ambulatory Visit: Payer: Self-pay | Admitting: Nurse Practitioner

## 2017-08-24 DIAGNOSIS — E782 Mixed hyperlipidemia: Secondary | ICD-10-CM

## 2017-08-24 DIAGNOSIS — I10 Essential (primary) hypertension: Secondary | ICD-10-CM

## 2017-09-22 ENCOUNTER — Inpatient Hospital Stay (HOSPITAL_COMMUNITY): Admission: RE | Admit: 2017-09-22 | Payer: Self-pay | Source: Other Acute Inpatient Hospital

## 2017-09-22 ENCOUNTER — Ambulatory Visit: Payer: Medicare Other | Admitting: Urology

## 2017-09-22 ENCOUNTER — Other Ambulatory Visit (HOSPITAL_COMMUNITY)
Admission: RE | Admit: 2017-09-22 | Discharge: 2017-09-22 | Disposition: A | Discharge status: Home / Self Care | Payer: Medicare Other | Source: Ambulatory Visit | Attending: Urology | Admitting: Urology

## 2017-09-22 DIAGNOSIS — C678 Malignant neoplasm of overlapping sites of bladder: Secondary | ICD-10-CM | POA: Insufficient documentation

## 2017-09-22 DIAGNOSIS — R8290 Unspecified abnormal findings in urine: Secondary | ICD-10-CM | POA: Diagnosis not present

## 2017-09-23 ENCOUNTER — Other Ambulatory Visit: Payer: Self-pay | Admitting: Physician Assistant

## 2017-10-06 ENCOUNTER — Ambulatory Visit (INDEPENDENT_AMBULATORY_CARE_PROVIDER_SITE_OTHER): Payer: Medicare Other | Admitting: Urology

## 2017-10-06 DIAGNOSIS — C678 Malignant neoplasm of overlapping sites of bladder: Secondary | ICD-10-CM | POA: Diagnosis not present

## 2017-10-13 ENCOUNTER — Ambulatory Visit (INDEPENDENT_AMBULATORY_CARE_PROVIDER_SITE_OTHER): Payer: Medicare Other | Admitting: Urology

## 2017-10-13 DIAGNOSIS — C678 Malignant neoplasm of overlapping sites of bladder: Secondary | ICD-10-CM | POA: Diagnosis not present

## 2017-10-20 ENCOUNTER — Ambulatory Visit (INDEPENDENT_AMBULATORY_CARE_PROVIDER_SITE_OTHER): Payer: Medicare Other | Admitting: Urology

## 2017-10-20 DIAGNOSIS — C678 Malignant neoplasm of overlapping sites of bladder: Secondary | ICD-10-CM | POA: Diagnosis not present

## 2017-10-22 ENCOUNTER — Other Ambulatory Visit: Payer: Self-pay | Admitting: Cardiovascular Disease

## 2017-10-27 ENCOUNTER — Encounter: Payer: Self-pay | Admitting: Nurse Practitioner

## 2017-10-27 ENCOUNTER — Ambulatory Visit (INDEPENDENT_AMBULATORY_CARE_PROVIDER_SITE_OTHER): Payer: Medicare Other | Admitting: Nurse Practitioner

## 2017-10-27 VITALS — BP 132/71 | HR 67 | Temp 97.4°F | Ht 69.0 in | Wt 172.0 lb

## 2017-10-27 DIAGNOSIS — I481 Persistent atrial fibrillation: Secondary | ICD-10-CM

## 2017-10-27 DIAGNOSIS — Z9889 Other specified postprocedural states: Secondary | ICD-10-CM | POA: Diagnosis not present

## 2017-10-27 DIAGNOSIS — E034 Atrophy of thyroid (acquired): Secondary | ICD-10-CM | POA: Diagnosis not present

## 2017-10-27 DIAGNOSIS — E782 Mixed hyperlipidemia: Secondary | ICD-10-CM | POA: Diagnosis not present

## 2017-10-27 DIAGNOSIS — J449 Chronic obstructive pulmonary disease, unspecified: Secondary | ICD-10-CM

## 2017-10-27 DIAGNOSIS — Z8679 Personal history of other diseases of the circulatory system: Secondary | ICD-10-CM

## 2017-10-27 DIAGNOSIS — N400 Enlarged prostate without lower urinary tract symptoms: Secondary | ICD-10-CM | POA: Diagnosis not present

## 2017-10-27 DIAGNOSIS — I5031 Acute diastolic (congestive) heart failure: Secondary | ICD-10-CM | POA: Diagnosis not present

## 2017-10-27 DIAGNOSIS — I10 Essential (primary) hypertension: Secondary | ICD-10-CM | POA: Diagnosis not present

## 2017-10-27 DIAGNOSIS — E559 Vitamin D deficiency, unspecified: Secondary | ICD-10-CM

## 2017-10-27 DIAGNOSIS — Z902 Acquired absence of lung [part of]: Secondary | ICD-10-CM

## 2017-10-27 DIAGNOSIS — Z6826 Body mass index (BMI) 26.0-26.9, adult: Secondary | ICD-10-CM

## 2017-10-27 DIAGNOSIS — I4819 Other persistent atrial fibrillation: Secondary | ICD-10-CM

## 2017-10-27 MED ORDER — FERROUS GLUCONATE 324 (38 FE) MG PO TABS: ORAL_TABLET | ORAL | 3 refills | Status: DC

## 2017-10-27 MED ORDER — AMLODIPINE BESYLATE 5 MG PO TABS: 5.0000 mg | ORAL_TABLET | Freq: Every day | ORAL | 4 refills | Status: DC

## 2017-10-27 MED ORDER — LISINOPRIL 40 MG PO TABS: 40.0000 mg | ORAL_TABLET | Freq: Every day | ORAL | 4 refills | Status: DC

## 2017-10-27 MED ORDER — LEVOTHYROXINE SODIUM 75 MCG PO TABS: 75.0000 ug | ORAL_TABLET | Freq: Every day | ORAL | 8 refills | Status: DC

## 2017-10-27 MED ORDER — ATORVASTATIN CALCIUM 40 MG PO TABS: 40.0000 mg | ORAL_TABLET | Freq: Every day | ORAL | 4 refills | Status: DC

## 2017-10-27 MED ORDER — METOPROLOL TARTRATE 25 MG PO TABS: 12.5000 mg | ORAL_TABLET | Freq: Two times a day (BID) | ORAL | 6 refills | Status: DC

## 2017-10-27 NOTE — Patient Instructions (Signed)

## 2017-10-27 NOTE — Progress Notes (Signed)
Subjective:    Patient ID: [name] [name], male    DOB: 1941-04-08, 77 y.o.   MRN: [id number]   Chief Complaint: Medical Management of Chronic issues  HPI:  1. Essential hypertension  No c/o chest pain, sob or headache. Does not check blood pressure at home. BP Readings from Last 3 Encounters:  08/03/17 (!) 140/92  07/28/17 119/78  07/22/17 135/72     2. Persistent atrial fibrillation ([place])  Was seen by cardiology 05/20/17. Was  on metoprolol and working well for him. No changes made to care.  He was told to stop his plavix this march but he has not done so. He is to follow up with cardiology in June 2019.  3. Chronic obstructive pulmonary disease, unspecified COPD type ([place])  Has been doing well. Has not seen pulmonology since he had lobectomy in November. Is still see oncology. He denies chronic cough..  4. Hypothyroidism due to acquired atrophy of thyroid  No problems that he is aware of.  5. Benign prostatic hyperplasia without lower urinary tract symptoms  He saw urology last week and had last treatmnet for bladder cancer. Last PSA was normal.  6. Vitamin D deficiency  Does not remember to take hid vitamin d daily.  7. Mixed hyperlipidemia  He has really been trying to watch his diet  8. BMI 25.0-25.9,adult  Has lost weight since his surgery but has been gaining the last several weeks..  9.     AAA         Had repair in 2011 and  follow up with vascular surgeon 08/03/17. No change in                size of AAA and will have repeat endograft duplex study in 12 months 10.   Lung cancer          He had lobectomy and he did not have to have any further treatments. Denies          SOB or fatigue.   Outpatient Encounter Medications as of 10/27/2017  Medication Sig  . alfuzosin (UROXATRAL) 10 MG 24 hr tablet Take 10 mg by mouth at bedtime.   Marland Kitchen amLODipine (NORVASC) 5 MG tablet Take 1 tablet (5 mg total) by mouth daily.  Marland Kitchen aspirin EC 81 MG tablet Take 1 tablet (81 mg total) by  mouth daily.  Marland Kitchen atorvastatin (LIPITOR) 40 MG tablet Take 1 tablet (40 mg total) by mouth daily.  . cholecalciferol (VITAMIN D) 1000 UNITS tablet Take 1 tablet (1,000 Units total) by mouth daily.  . clopidogrel (PLAVIX) 75 MG tablet Take 1 tablet (75 mg total) by mouth daily with breakfast.  . doxycycline (VIBRA-TABS) 100 MG tablet Take 1 tablet (100 mg total) by mouth 2 (two) times daily.  . ferrous gluconate (FERGON) 324 MG tablet TAKE (1) TABLET TWICE A DAY WITH MEALS (BREAKFAST AND SUPPER)  . levothyroxine (SYNTHROID, LEVOTHROID) 75 MCG tablet Take 1 tablet (75 mcg total) by mouth daily.  Marland Kitchen lisinopril (PRINIVIL,ZESTRIL) 40 MG tablet Take 1 tablet (40 mg total) by mouth daily.  . metoprolol tartrate (LOPRESSOR) 25 MG tablet Take 0.5 tablets (12.5 mg total) by mouth 2 (two) times daily.  . Multiple Vitamin (ONE-A-DAY MENS PO) Take 1 tablet by mouth daily.    . Omega-3 Fatty Acids (FISH OIL) 1200 MG CAPS Take 2,400 mg by mouth daily.   . pantoprazole (PROTONIX) 40 MG tablet Take 1 tablet (40 mg total) by mouth 2 (two) times daily.  Marland Kitchen  predniSONE (STERAPRED UNI-PAK 21 TAB) 10 MG (21) TBPK tablet As directed x 6 days      New complaints: None today  Social history: Lives with wife and she is doing well. She has really taken good care of him with all he has gone through.  Review of Systems  Constitutional: Negative for activity change and appetite change.  HENT: Negative.   Eyes: Negative for pain.  Respiratory: Negative for shortness of breath.   Cardiovascular: Negative for chest pain, palpitations and leg swelling.  Gastrointestinal: Negative for abdominal pain.  Endocrine: Negative for polydipsia.  Genitourinary: Negative.   Skin: Negative for rash.  Neurological: Negative for dizziness, weakness and headaches.  Hematological: Does not bruise/bleed easily.  Psychiatric/Behavioral: Negative.   All other systems reviewed and are negative.      Objective:   Physical Exam    Constitutional: He is oriented to person, place, and time.  HENT:  Head: Normocephalic.  Nose: Nose normal.  Mouth/Throat: Oropharynx is clear and moist.  Eyes: Pupils are equal, round, and reactive to light. EOM are normal.  Neck: Normal range of motion and phonation normal. Neck supple. No JVD present. Carotid bruit is not present. No thyroid mass and no thyromegaly present.  Cardiovascular: Normal rate and regular rhythm.  Pulmonary/Chest: Effort normal and breath sounds normal. No respiratory distress.  Abdominal: Soft. Normal appearance, normal aorta and bowel sounds are normal. There is no tenderness.  Musculoskeletal: Normal range of motion.  Lymphadenopathy:    He has no cervical adenopathy.  Neurological: He is alert and oriented to person, place, and time.  Skin: Skin is warm and dry.  Psychiatric: Judgment normal.   BP 132/71   Pulse 67   Temp (!) 97.4 F (36.3 C) (Oral)   Ht 5' 9"  (1.753 m)   Wt 172 lb (78 kg)   BMI 25.40 kg/m         Assessment & Plan:  GARRETH BURNSWORTH comes in today with chief complaint of Medical Management of Chronic Issues   Diagnosis and orders addressed:  1. Essential hypertension Low sodium diet - amLODipine (NORVASC) 5 MG tablet; Take 1 tablet (5 mg total) by mouth daily.  Dispense: 30 tablet; Refill: 4 - CMP14+EGFR  2. Mixed hyperlipidemia Low fat diet - lisinopril (PRINIVIL,ZESTRIL) 40 MG tablet; Take 1 tablet (40 mg total) by mouth daily.  Dispense: 30 tablet; Refill: 4 - atorvastatin (LIPITOR) 40 MG tablet; Take 1 tablet (40 mg total) by mouth daily.  Dispense: 30 tablet; Refill: 4 - Lipid panel  3. Hypothyroidism due to acquired atrophy of thyroid - levothyroxine (SYNTHROID, LEVOTHROID) 75 MCG tablet; Take 1 tablet (75 mcg total) by mouth daily.  Dispense: 30 tablet; Refill: 8 - Thyroid Panel With TSH  4. Chronic obstructive pulmonary disease, unspecified COPD type ([place]) See pulmonolgy if develop cough   5. Benign  prostatic hyperplasia without lower urinary tract symptoms Keep follow up with urology If develop trouble voiding ned to bee seen as soon as possible  6. Vitamin D deficiency continue vitamin d daioly  7. Persistent atrial fibrillation ([facility]) Is heart starts racing need to be seen ASAP  8. BMI 25.0-25.9,adult Discussed diet and exercise for person with BMI >25 Will recheck weight in 3-6 months   9. S/P AAA repair Keep follow up to have measured in NOvemeber 2019- let me know if I need to coordinate care for that test  10. S/P lobectomy of lung Continue with thoracic surgeon follow up in NOvember  as instructed- if develop SOB need to let me know  11. Acute diastolic heart failure [facility]) Keep cardiology follow up in June - metoprolol tartrate (LOPRESSOR) 25 MG tablet; Take 0.5 tablets (12.5 mg total) by mouth 2 (two) times daily.  Dispense: 30 tablet; Refill: 6   Labs pending Health Maintenance reviewed Diet and exercise encouraged  Follow up plan: 3 months   [name] Hassell Done, FNP

## 2017-10-28 LAB — CMP14+EGFR
A/G RATIO: 1.2 (ref 1.2–2.2)
ALBUMIN: 4.4 g/dL (ref 3.5–4.8)
ALK PHOS: 113 IU/L (ref 39–117)
ALT: 15 IU/L (ref 0–44)
AST: 28 IU/L (ref 0–40)
BILIRUBIN TOTAL: 0.6 mg/dL (ref 0.0–1.2)
BUN/Creatinine Ratio: 12 (ref 10–24)
BUN: 12 mg/dL (ref 8–27)
CHLORIDE: 101 mmol/L (ref 96–106)
CO2: 26 mmol/L (ref 20–29)
Calcium: 9.5 mg/dL (ref 8.6–10.2)
Creatinine, Ser: 0.97 mg/dL (ref 0.76–1.27)
GFR calc Af Amer: 87 mL/min/{1.73_m2} (ref >59)
GFR, EST NON AFRICAN AMERICAN: 75 mL/min/{1.73_m2} (ref >59)
Globulin, Total: 3.8 g/dL (ref 1.5–4.5)
Glucose: 96 mg/dL (ref 65–99)
POTASSIUM: 4.6 mmol/L (ref 3.5–5.2)
SODIUM: 141 mmol/L (ref 134–144)
TOTAL PROTEIN: 8.2 g/dL (ref 6.0–8.5)

## 2017-10-28 LAB — THYROID PANEL WITH TSH
FREE THYROXINE INDEX: 1.3 (ref 1.2–4.9)
T3 UPTAKE RATIO: 26 % (ref 24–39)
T4, Total: 5.1 ug/dL (ref 4.5–12.0)
TSH: 3.37 u[IU]/mL (ref 0.450–4.500)

## 2017-10-28 LAB — LIPID PANEL
Chol/HDL Ratio: 4.1 ratio (ref 0.0–5.0)
Cholesterol, Total: 110 mg/dL (ref 100–199)
HDL: 27 mg/dL — ABNORMAL LOW (ref >39)
LDL Calculated: 58 mg/dL (ref 0–99)
TRIGLYCERIDES: 125 mg/dL (ref 0–149)
VLDL Cholesterol Cal: 25 mg/dL (ref 5–40)

## 2017-11-05 ENCOUNTER — Ambulatory Visit (HOSPITAL_COMMUNITY)
Admission: RE | Admit: 2017-11-05 | Discharge: 2017-11-05 | Disposition: A | Discharge status: Home / Self Care | Payer: Medicare Other | Source: Ambulatory Visit | Attending: Internal Medicine | Admitting: Internal Medicine

## 2017-11-05 ENCOUNTER — Inpatient Hospital Stay: Payer: Medicare Other | Attending: Internal Medicine

## 2017-11-05 DIAGNOSIS — Z902 Acquired absence of lung [part of]: Secondary | ICD-10-CM | POA: Diagnosis not present

## 2017-11-05 DIAGNOSIS — I700 Atherosclerosis of aorta: Secondary | ICD-10-CM | POA: Diagnosis not present

## 2017-11-05 DIAGNOSIS — C3411 Malignant neoplasm of upper lobe, right bronchus or lung: Secondary | ICD-10-CM | POA: Insufficient documentation

## 2017-11-05 DIAGNOSIS — C3490 Malignant neoplasm of unspecified part of unspecified bronchus or lung: Secondary | ICD-10-CM | POA: Insufficient documentation

## 2017-11-05 DIAGNOSIS — J90 Pleural effusion, not elsewhere classified: Secondary | ICD-10-CM | POA: Insufficient documentation

## 2017-11-05 DIAGNOSIS — I899 Noninfective disorder of lymphatic vessels and lymph nodes, unspecified: Secondary | ICD-10-CM | POA: Diagnosis not present

## 2017-11-05 DIAGNOSIS — J432 Centrilobular emphysema: Secondary | ICD-10-CM | POA: Diagnosis not present

## 2017-11-05 DIAGNOSIS — Z79899 Other long term (current) drug therapy: Secondary | ICD-10-CM | POA: Diagnosis not present

## 2017-11-05 LAB — CBC WITH DIFFERENTIAL/PLATELET
BASOS ABS: 0.1 10*3/uL (ref 0.0–0.1)
BASOS PCT: 1 %
Eosinophils Absolute: 0.2 10*3/uL (ref 0.0–0.5)
Eosinophils Relative: 3 %
HCT: 40.1 % (ref 38.4–49.9)
HEMOGLOBIN: 13.5 g/dL (ref 13.0–17.1)
LYMPHS PCT: 20 %
Lymphs Abs: 1.3 10*3/uL (ref 0.9–3.3)
MCH: 31.6 pg (ref 27.2–33.4)
MCHC: 33.8 g/dL (ref 32.0–36.0)
MCV: 93.5 fL (ref 79.3–98.0)
Monocytes Absolute: 0.5 10*3/uL (ref 0.1–0.9)
Monocytes Relative: 8 %
NEUTROS ABS: 4.4 10*3/uL (ref 1.5–6.5)
NEUTROS PCT: 68 %
Platelets: 162 10*3/uL (ref 140–400)
RBC: 4.28 MIL/uL (ref 4.20–5.82)
RDW: 13.7 % (ref 11.0–14.6)
WBC: 6.5 10*3/uL (ref 4.0–10.3)

## 2017-11-05 LAB — COMPREHENSIVE METABOLIC PANEL
ALBUMIN: 4 g/dL (ref 3.5–5.0)
ALT: 18 U/L (ref 0–55)
AST: 27 U/L (ref 5–34)
Alkaline Phosphatase: 113 U/L (ref 40–150)
Anion gap: 8 (ref 3–11)
BUN: 18 mg/dL (ref 7–26)
CALCIUM: 9.6 mg/dL (ref 8.4–10.4)
CO2: 27 mmol/L (ref 22–29)
Chloride: 107 mmol/L (ref 98–109)
Creatinine, Ser: 1.24 mg/dL (ref 0.70–1.30)
GFR calc Af Amer: >60 mL/min (ref >60)
GFR, EST NON AFRICAN AMERICAN: 54 mL/min — AB (ref >60)
Glucose, Bld: 104 mg/dL (ref 70–140)
POTASSIUM: 4.7 mmol/L (ref 3.5–5.1)
Sodium: 142 mmol/L (ref 136–145)
TOTAL PROTEIN: 8.5 g/dL — AB (ref 6.4–8.3)
Total Bilirubin: 0.6 mg/dL (ref 0.2–1.2)

## 2017-11-05 MED ORDER — IOPAMIDOL (ISOVUE-300) INJECTION 61%
75.0000 mL | Freq: Once | INTRAVENOUS | Status: AC | PRN
  Administered 2017-11-05: 75 mL via INTRAVENOUS

## 2017-11-09 ENCOUNTER — Inpatient Hospital Stay: Payer: Medicare Other | Admitting: Internal Medicine

## 2017-11-09 ENCOUNTER — Telehealth: Payer: Self-pay

## 2017-11-09 ENCOUNTER — Encounter: Payer: Self-pay | Admitting: Internal Medicine

## 2017-11-09 DIAGNOSIS — C3490 Malignant neoplasm of unspecified part of unspecified bronchus or lung: Secondary | ICD-10-CM

## 2017-11-09 DIAGNOSIS — Z79899 Other long term (current) drug therapy: Secondary | ICD-10-CM | POA: Diagnosis not present

## 2017-11-09 DIAGNOSIS — C3411 Malignant neoplasm of upper lobe, right bronchus or lung: Secondary | ICD-10-CM

## 2017-11-09 NOTE — Progress Notes (Signed)
[facility] [phone]   [phone]  OFFICE PROGRESS NOTE  [name], [place] [address]  DIAGNOSIS: Stage IA (T1a, N0, M0) non-small cell lung cancer, squamous cell carcinoma presented with right upper lobe lung nodule   PRIOR THERAPY: status post right upper lobectomy with lymph node dissection on March 30, 2017 under the care of Dr. Roxan Hockey.  CURRENT THERAPY: Observation.  INTERVAL HISTORY: [name] [name] 77 y.o. male returns to the clinic today for returns to the clinic today for 6 months follow-up visit accompanied by his wife.  The patient is feeling fine today with no specific complaints.  He has some seasonal allergy with chest congestion and cough.  He denied having any recent fever or chills.  He has no chest pain, shortness breath or hemoptysis.  He denied having any weight loss or night sweats.  He has no nausea, vomiting, diarrhea or constipation.  The patient had repeat CT scan of the chest performed recently and he is here for evaluation and discussion of his discuss results.  MEDICAL HISTORY: Past Medical History:  Diagnosis Date  . AAA (abdominal aortic aneurysm) ([place])   . Aortic stenosis, severe    a. 02/2017: s/p TAVR with Edwards Sapien 3 THV (size 26 mm, model # 9600TFX, serial # [id number])  . Arthritis   . Atherosclerotic peripheral vascular disease ([place])   . Atrial fibrillation, persistent ([place])    a. not a candidate for [place] given hx of GI bleed  . Bilateral carotid artery stenosis   . COPD (chronic obstructive pulmonary disease) ([place])   . Hyperlipidemia   . Hypertension   . Hypothyroidism   . Lung cancer ([place])    Right  . Lung mass    Right  . Skin cancer   . Vitamin D deficiency     ALLERGIES:  is allergic to eliquis [apixaban] and lovenox [enoxaparin].  MEDICATIONS:  Current Outpatient Medications  Medication Sig Dispense Refill  . alfuzosin (UROXATRAL) 10 MG  24 hr tablet Take 10 mg by mouth at bedtime.     Marland Kitchen amLODipine (NORVASC) 5 MG tablet Take 1 tablet (5 mg total) by mouth daily. 30 tablet 4  . aspirin EC 81 MG tablet Take 1 tablet (81 mg total) by mouth daily. 90 tablet 3  . atorvastatin (LIPITOR) 40 MG tablet Take 1 tablet (40 mg total) by mouth daily. 30 tablet 4  . cholecalciferol (VITAMIN D) 1000 UNITS tablet Take 1 tablet (1,000 Units total) by mouth daily. 30 tablet 6  . ferrous gluconate (FERGON) 324 MG tablet TAKE (1) TABLET TWICE A DAY WITH MEALS (BREAKFAST AND SUPPER) 60 tablet 3  . levothyroxine (SYNTHROID, LEVOTHROID) 75 MCG tablet Take 1 tablet (75 mcg total) by mouth daily. 30 tablet 8  . lisinopril (PRINIVIL,ZESTRIL) 40 MG tablet Take 1 tablet (40 mg total) by mouth daily. 30 tablet 4  . metoprolol tartrate (LOPRESSOR) 25 MG tablet Take 0.5 tablets (12.5 mg total) by mouth 2 (two) times daily. 30 tablet 6  . Multiple Vitamin (ONE-A-DAY MENS PO) Take 1 tablet by mouth daily.      . Omega-3 Fatty Acids (FISH OIL) 1200 MG CAPS Take 2,400 mg by mouth daily.     . pantoprazole (PROTONIX) 40 MG tablet Take 1 tablet (40 mg total) by mouth 2 (two) times daily. 60 tablet 6   No current facility-administered medications for this visit.     SURGICAL HISTORY:  Past Surgical History:  Procedure Laterality Date  . ABDOMINAL AORTIC ANEURYSM REPAIR  08-09-09  . BACK SURGERY    . ESOPHAGOGASTRODUODENOSCOPY N/A 02/21/2017   Procedure: ESOPHAGOGASTRODUODENOSCOPY (EGD);  Surgeon: Jerene Bears, MD;  Location: [facility] ENDOSCOPY;  Service: Gastroenterology;  Laterality: N/A;  . HYDROCELE EXCISION / REPAIR    . LOBECTOMY Right 03/30/2017   Procedure: RIGHT UPPER LOBECTOMY LUNG;  Surgeon: Melrose Nakayama, MD;  Location: [facility];  Service: Thoracic;  Laterality: Right;  . SKIN CANCER EXCISION    . SPINE SURGERY    . TEE WITHOUT CARDIOVERSION N/A 02/24/2017   Procedure: TRANSESOPHAGEAL ECHOCARDIOGRAM (TEE);  Surgeon: Sherren Mocha, MD;  Location: [facility];   Service: Open Heart Surgery;  Laterality: N/A;  . TRANSCATHETER AORTIC VALVE REPLACEMENT, TRANSFEMORAL N/A 02/24/2017   Procedure: TRANSCATHETER AORTIC VALVE REPLACEMENT, TRANSFEMORAL using a 34mm Edwards Sapien 3 Aortic Valve;  Surgeon: Sherren Mocha, MD;  Location: [facility];  Service: Open Heart Surgery;  Laterality: N/A;  . TRANSURETHRAL RESECTION OF BLADDER TUMOR N/A 06/06/2016   Procedure: TRANSURETHRAL RESECTION OF BLADDER TUMOR (TURBT);  Surgeon: Franchot Gallo, MD;  Location: [facility];  Service: Urology;  Laterality: N/A;  . TRANSURETHRAL RESECTION OF BLADDER TUMOR N/A 07/22/2016   Procedure: TRANSURETHRAL RESECTION OF BLADDER TUMOR (TURBT);  Surgeon: Franchot Gallo, MD;  Location: [facility];  Service: Urology;  Laterality: N/A;  . VIDEO ASSISTED THORACOSCOPY (VATS)/WEDGE RESECTION Right 03/30/2017   Procedure: VIDEO ASSISTED THORACOSCOPY (VATS)/WEDGE RESECTION;  Surgeon: Melrose Nakayama, MD;  Location: [facility];  Service: Thoracic;  Laterality: Right;    REVIEW OF SYSTEMS:  A comprehensive review of systems was negative except for: Respiratory: positive for cough   PHYSICAL EXAMINATION: General appearance: alert, cooperative and no distress Head: Normocephalic, without obvious abnormality, atraumatic Neck: no adenopathy, no JVD, supple, symmetrical, trachea midline and thyroid not enlarged, symmetric, no tenderness/mass/nodules Lymph nodes: Cervical, supraclavicular, and axillary nodes normal. Resp: clear to auscultation bilaterally Back: symmetric, no curvature. ROM normal. No CVA tenderness. Cardio: regular rate and rhythm, S1, S2 normal, no murmur, click, rub or gallop GI: soft, non-tender; bowel sounds normal; no masses,  no organomegaly Extremities: extremities normal, atraumatic, no cyanosis or edema  ECOG PERFORMANCE STATUS: 1 - Symptomatic but completely ambulatory  Blood pressure 131/65, pulse 73, temperature 98.4 F (36.9 C), temperature source Oral, resp. rate 18, height  5\' 9"  (1.753 m), weight 173 lb 3.2 oz (78.6 kg), SpO2 99 %.  LABORATORY DATA: Lab Results  Component Value Date   WBC 6.5 11/05/2017   HGB 13.5 11/05/2017   HCT 40.1 11/05/2017   MCV 93.5 11/05/2017   PLT 162 11/05/2017      Chemistry      Component Value Date/Time   NA 142 11/05/2017 0915   NA 141 10/27/2017 0948   NA 140 05/07/2017 1419   K 4.7 11/05/2017 0915   K 4.2 05/07/2017 1419   CL 107 11/05/2017 0915   CO2 27 11/05/2017 0915   CO2 25 05/07/2017 1419   BUN 18 11/05/2017 0915   BUN 12 10/27/2017 0948   BUN 14.5 05/07/2017 1419   CREATININE 1.24 11/05/2017 0915   CREATININE 1.0 05/07/2017 1419      Component Value Date/Time   CALCIUM 9.6 11/05/2017 0915   CALCIUM 9.6 05/07/2017 1419   ALKPHOS 113 11/05/2017 0915   ALKPHOS 85 05/07/2017 1419   AST 27 11/05/2017 0915   AST 23 05/07/2017 1419   ALT 18 11/05/2017 0915   ALT 14 05/07/2017  1419   BILITOT 0.6 11/05/2017 0915   BILITOT 0.6 10/27/2017 0948   BILITOT 0.48 05/07/2017 1419       RADIOGRAPHIC STUDIES: Ct Chest W Contrast  Result Date: 11/06/2017 CLINICAL DATA:  Patient with history of lung cancer. Follow-up evaluation. EXAM: CT CHEST WITH CONTRAST TECHNIQUE: Multidetector CT imaging of the chest was performed during intravenous contrast administration. CONTRAST:  85mL ISOVUE-300 IOPAMIDOL (ISOVUE-300) INJECTION 61% COMPARISON:  CT chest 01/29/2017. FINDINGS: Cardiovascular: Normal heart size. Interval TAVR. No pericardial effusion. Dense coronary arterial vascular calcifications. Thoracic aortic vascular calcifications. Mediastinum/Nodes: Normal esophagus. Stable subcentimeter mediastinal lymph nodes. Interval enlargement of superior mediastinal lymph node measuring 1.0 cm (image 33; series 2), previously 0.4 cm. Lungs/Pleura: Central airways are patent. Interval right upper lobectomy. Centrilobular and paraseptal emphysematous changes. Scarring/atelectasis left lower lobe. Interval development of a small  right pleural effusion. Upper Abdomen: No acute process. Musculoskeletal: Thoracic spine degenerative changes. No aggressive or acute appearing osseous lesions. IMPRESSION: 1. Interval right upper lobectomy. No definite evidence for localized recurrence. 2. There is a new thickened superior mediastinal lymph node measuring 10 mm, potentially reactive from interval surgery. Recommend attention on follow-up. 3. New small right pleural effusion. 4. Aortic Atherosclerosis (ICD10-I70.0) and Emphysema (ICD10-J43.9). Electronically Signed   By: Lovey Newcomer M.D.   On: 11/06/2017 09:59    ASSESSMENT AND PLAN: This is a very pleasant 77 years old white male with a stage Ia non-small cell lung cancer status post right upper lobectomy with lymph node dissection in October 2018. The patient has been on observation since that time. Repeat CT scan of the chest performed recently showed no concerning findings for disease recurrence but there was a suspicious superior mediastinal lymph node measuring 1.0 cm. I personally and independently reviewed the scan images and discussed the results and showed the images to the patient and his wife today.  I recommended for him to continue in observation but I will repeat CT scan of the chest in 4 months for evaluation of his disease and to rule out any disease recurrence in the mediastinal lymph nodes. The patient was advised to call immediately if he has any concerning symptoms in the interval. The patient voices understanding of current disease status and treatment options and is in agreement with the current care plan.  All questions were answered. The patient knows to call the clinic with any problems, questions or concerns. We can certainly see the patient much sooner if necessary.  I spent 10 minutes counseling the patient face to face. The total time spent in the appointment was 15 minutes.  Disclaimer: This note was dictated with voice recognition software. Similar  sounding words can inadvertently be transcribed and may not be corrected upon review.

## 2017-11-09 NOTE — Telephone Encounter (Signed)
Printed avs and calender of upcoming appointment. Per 5/2 los

## 2017-11-23 ENCOUNTER — Encounter: Payer: Self-pay | Admitting: Cardiovascular Disease

## 2017-11-23 ENCOUNTER — Ambulatory Visit: Payer: Medicare Other | Admitting: Cardiovascular Disease

## 2017-11-23 VITALS — BP 118/60 | HR 61 | Ht 70.0 in | Wt 174.0 lb

## 2017-11-23 DIAGNOSIS — I739 Peripheral vascular disease, unspecified: Secondary | ICD-10-CM

## 2017-11-23 DIAGNOSIS — I5032 Chronic diastolic (congestive) heart failure: Secondary | ICD-10-CM

## 2017-11-23 DIAGNOSIS — I482 Chronic atrial fibrillation: Secondary | ICD-10-CM

## 2017-11-23 DIAGNOSIS — Z952 Presence of prosthetic heart valve: Secondary | ICD-10-CM | POA: Diagnosis not present

## 2017-11-23 DIAGNOSIS — Z95828 Presence of other vascular implants and grafts: Secondary | ICD-10-CM | POA: Diagnosis not present

## 2017-11-23 DIAGNOSIS — I10 Essential (primary) hypertension: Secondary | ICD-10-CM

## 2017-11-23 DIAGNOSIS — E785 Hyperlipidemia, unspecified: Secondary | ICD-10-CM | POA: Diagnosis not present

## 2017-11-23 DIAGNOSIS — I4821 Permanent atrial fibrillation: Secondary | ICD-10-CM

## 2017-11-23 NOTE — Progress Notes (Signed)
SUBJECTIVE: The patient presents for routine follow-up.  He underwent TAVR in September 2018.    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 Coronary angiography on 01/14/17 showed 60% proximal LAD stenosis with mild nonobstructive disease in the circumflex and RCA.  It was decided to manage this medically.  He also has a history of COPD, abdominal aortic aneurysm status post stent graft repair in 2011, and atrial fibrillation.  He has a history of high-grade, non-muscle invasive bladder cancer and underwent surgery on 06/06/16.  Most recent echocardiogram on 03/25/17 demonstrated normally functioning prosthetic aortic valve with normal left ventricular systolic function and regional wall motion, LVEF 55-60%.  He developed GI bleeding on Eliquis and this was discontinued.   He is doing very well and denies chest pain, palpitations, orthopnea, dizziness, and leg swelling.  He stays very active performing work outdoors and around the house.  He has some shortness of breath when it is hot and humid outside.  He is also bothered by seasonal allergies.  He and his wife have been married for over 41 years.     Review of Systems: As per "subjective", otherwise negative.  Allergies  Allergen Reactions  . Eliquis [Apixaban] Other (See Comments)    Eliquis and lovenox caused his " ulcer to bleed."  . Lovenox [Enoxaparin]     HIT panel ordered 10/10    Current Outpatient Medications  Medication Sig Dispense Refill  . alfuzosin (UROXATRAL) 10 MG 24 hr tablet Take 10 mg by mouth at bedtime.     Marland Kitchen amLODipine (NORVASC) 5 MG tablet Take 1 tablet (5 mg total) by mouth daily. 30 tablet 4  . aspirin EC 81 MG tablet Take 1 tablet (81 mg total) by mouth daily. 90 tablet 3  . atorvastatin (LIPITOR) 40 MG tablet Take 1 tablet (40 mg total) by mouth daily. 30 tablet 4  . cholecalciferol (VITAMIN D) 1000 UNITS tablet Take 1 tablet (1,000 Units total) by mouth  daily. 30 tablet 6  . ferrous gluconate (FERGON) 324 MG tablet TAKE (1) TABLET TWICE A DAY WITH MEALS (BREAKFAST AND SUPPER) 60 tablet 3  . levothyroxine (SYNTHROID, LEVOTHROID) 75 MCG tablet Take 1 tablet (75 mcg total) by mouth daily. 30 tablet 8  . lisinopril (PRINIVIL,ZESTRIL) 40 MG tablet Take 1 tablet (40 mg total) by mouth daily. 30 tablet 4  . metoprolol tartrate (LOPRESSOR) 25 MG tablet Take 0.5 tablets (12.5 mg total) by mouth 2 (two) times daily. 30 tablet 6  . Multiple Vitamin (ONE-A-DAY MENS PO) Take 1 tablet by mouth daily.      . Omega-3 Fatty Acids (FISH OIL) 1200 MG CAPS Take 2,400 mg by mouth daily.     . pantoprazole (PROTONIX) 40 MG tablet Take 1 tablet (40 mg total) by mouth 2 (two) times daily. 60 tablet 6   No current facility-administered medications for this visit.     Past Medical History:  Diagnosis Date  . AAA (abdominal aortic aneurysm) ([place])   . Aortic stenosis, severe    a. 02/2017: s/p TAVR with Edwards Sapien 3 THV (size 26 mm, model # 9600TFX, serial # [id number])  . Arthritis   . Atherosclerotic peripheral vascular disease ([place])   . Atrial fibrillation, persistent ([place])    a. not a candidate for [facility] given hx of GI bleed  . Bilateral carotid artery stenosis   . COPD (chronic obstructive pulmonary disease) ([place])   . Hyperlipidemia   .  Hypertension   . Hypothyroidism   . Lung cancer ([place])    Right  . Lung mass    Right  . Skin cancer   . Vitamin D deficiency     Past Surgical History:  Procedure Laterality Date  . ABDOMINAL AORTIC ANEURYSM REPAIR  08-09-09  . BACK SURGERY    . ESOPHAGOGASTRODUODENOSCOPY N/A 02/21/2017   Procedure: ESOPHAGOGASTRODUODENOSCOPY (EGD);  Surgeon: Jerene Bears, MD;  Location[facility] END[facility]COPY;  Service: Gastroenterology;  Laterality: N/A;  . HYDROCELE EXCISION / REPAIR    . LOBECTOMY Right 03/30/2017   Procedure: RIGHT UPPER LOBECTOMY LUNG;  Surgeon: Melrose Nakayama, MD;  Location: [place];  Service: Thoracic;  Laterality:  Right;  . SKIN CANCER EXCISION    . SPINE SURGERY    . TEE WITHOUT CARDIOVERSION N/A 02/24/2017   Procedure: TRANSESOPHAGEAL ECHOCARDIOGRAM (TEE);  Surgeon: Sherren Mocha, MD;  Location: [place];  Service: Open Heart Surgery;  Laterality: N/A;  . TRANSCATHETER AORTIC VALVE REPLACEMENT, TRANSFEMORAL N/A 02/24/2017   Procedure: TRANSCATHETER AORTIC VALVE REPLACEMENT, TRANSFEMORAL using a 5mm Edwards Sapien 3 Aortic Valve;  Surgeon: Sherren Mocha, MD;  Location: [place];  Service: Open Heart Surgery;  Laterality: N/A;  . TRANSURETHRAL RESECTION OF BLADDER TUMOR N/A 06/06/2016   Procedure: TRANSURETHRAL RESECTION OF BLADDER TUMOR (TURBT);  Surgeon: Franchot Gallo, MD;  Location: [facility];  Service: Urology;  Laterality: N/A;  . TRANSURETHRAL RESECTION OF BLADDER TUMOR N/A 07/22/2016   Procedure: TRANSURETHRAL RESECTION OF BLADDER TUMOR (TURBT);  Surgeon: Franchot Gallo, MD;  Location: [facility];  Service: Urology;  Laterality: N/A;  . VIDEO ASSISTED THORACOSCOPY (VATS)/WEDGE RESECTION Right 03/30/2017   Procedure: VIDEO ASSISTED THORACOSCOPY (VATS)/WEDGE RESECTION;  Surgeon: Melrose Nakayama, MD;  Location[facility] OR;  Service: Thoracic;  Laterality: Right;    Social History   Socioeconomic History  . Marital status: Married    Spouse name: Avante Carneiro  . Number of children: 2  . Years of education: Not on file  . Highest education level: Not on file  Occupational History  . Occupation: Retired    Comment: Librarian, academic with [employer]  Social Needs  . Financial resource strain: Not very hard  . Food insecurity:    Worry: Never true    Inability: Never true  . Transportation needs:    Medical: No    Non-medical: No  Tobacco Use  . Smoking status: Former Smoker    Packs/day: 1.00    Years: 51.00    Pack years: 51.00    Types: Cigarettes    Start date: 11/25/1954    Last attempt to quit: 10/27/2005    Years since quitting: 12.0  . Smokeless tobacco: Never Used  Substance and  Sexual Activity  . Alcohol use: No    Alcohol/week: 0.0 oz    Comment: none since 1999  . Drug use: No  . Sexual activity: Yes  Lifestyle  . Physical activity:    Days per week: 7 days    Minutes per session: 30 min  . Stress: Only a little  Relationships  . Social connections:    Talks on phone: More than three times a week    Gets together: More than three times a week    Attends religious service: More than 4 times per year    Active member of club or organization: Yes    Attends meetings of clubs or organizations: More than 4 times per year    Relationship status: Married  . Intimate partner violence:  Fear of current or ex partner: No    Emotionally abused: No    Physically abused: No    Forced sexual activity: No  Other Topics Concern  . Not on file  Social History Narrative   2 sons that are deceased. 4 grandchildren and 3 great grandchildren. Lives at home with his wife.      Vitals:   11/23/17 1249  BP: 118/60  Pulse: 61  SpO2: 98%  Weight: 174 lb (78.9 kg)  Height: 5\' 10"  (1.778 m)    Wt Readings from Last 3 Encounters:  11/23/17 174 lb (78.9 kg)  11/09/17 173 lb 3.2 oz (78.6 kg)  10/27/17 172 lb (78 kg)     PHYSICAL EXAM General: NAD HEENT: Normal. Neck: No JVD, no thyromegaly. Lungs: Clear to auscultation bilaterally with normal respiratory effort. CV: Regular rate and irregular rhythm, normal S1/S2, no S3, no murmur. No pretibial or periankle edema.  No carotid bruit.   Abdomen: Soft, nontender, no distention.  Neurologic: Alert and oriented.  Psych: Normal affect. Skin: Normal. Musculoskeletal: No gross deformities.    ECG: Most recent ECG reviewed.   Labs: Lab Results  Component Value Date/Time   K 4.7 11/05/2017 09:15 AM   K 4.2 05/07/2017 02:19 PM   BUN 18 11/05/2017 09:15 AM   BUN 12 10/27/2017 09:48 AM   BUN 14.5 05/07/2017 02:19 PM   CREATININE 1.24 11/05/2017 09:15 AM   CREATININE 1.0 05/07/2017 02:19 PM   ALT 18  11/05/2017 09:15 AM   ALT 14 05/07/2017 02:19 PM   TSH 3.370 10/27/2017 09:48 AM   HGB 13.5 11/05/2017 09:15 AM   HGB 13.1 05/07/2017 02:19 PM     Lipids: Lab Results  Component Value Date/Time   LDLCALC 58 10/27/2017 09:48 AM   LDLCALC 61 04/13/2014 08:46 AM   LDLCALC 52 01/14/2013 11:57 AM   CHOL 110 10/27/2017 09:48 AM   CHOL 118 01/14/2013 11:57 AM   TRIG 125 10/27/2017 09:48 AM   TRIG 160 (H) 04/13/2014 08:46 AM   TRIG 178 (H) 01/14/2013 11:57 AM   HDL 27 (L) 10/27/2017 09:48 AM   HDL 32 (L) 04/13/2014 08:46 AM   HDL 30 (L) 01/14/2013 11:57 AM       ASSESSMENT AND PLAN: 1. Severe aortic stenosis s/p TAVR in 02/2017: Symptomatically stable.  Normal prosthetic valve function as noted above by most recent echocardiogram in October 2018.  Continue aspirin.  2. Chronic diastolic heart failure: Euvolemic and stable.  No change to therapy.  3.  Permanent atrial fibrillation: Symptomatically stable on metoprolol.  Currently not an anticoagulation candidate given history of GI bleeding.  Only on aspirin.  Hemoglobin normal at 13.5 on 11/05/2017.  4.  Coronary artery disease: Symptomatically stable.  Continue aspirin, Lipitor, and metoprolol.  5.  Hyperlipidemia: Continue Lipitor.  6.  Chronic hypertension: Blood pressure is controlled.  No changes to therapy.  7.  Peripheral vascular disease: Followed by vascular surgery. Continue aspirin and statin.      Disposition: Follow up 6 months   Kate Sable, M.D., F.A.C.C.

## 2017-11-23 NOTE — Patient Instructions (Signed)

## 2017-12-15 IMAGING — CR DG CHEST 2V
2 series · 2 of 2 positions shown · non-contrast
Comparison: CT 01/29/2017.  PET-CT 12/02/2016.

CLINICAL DATA: Aortic valve repair.

EXAM:
CHEST  2 VIEW

[w chest pa]
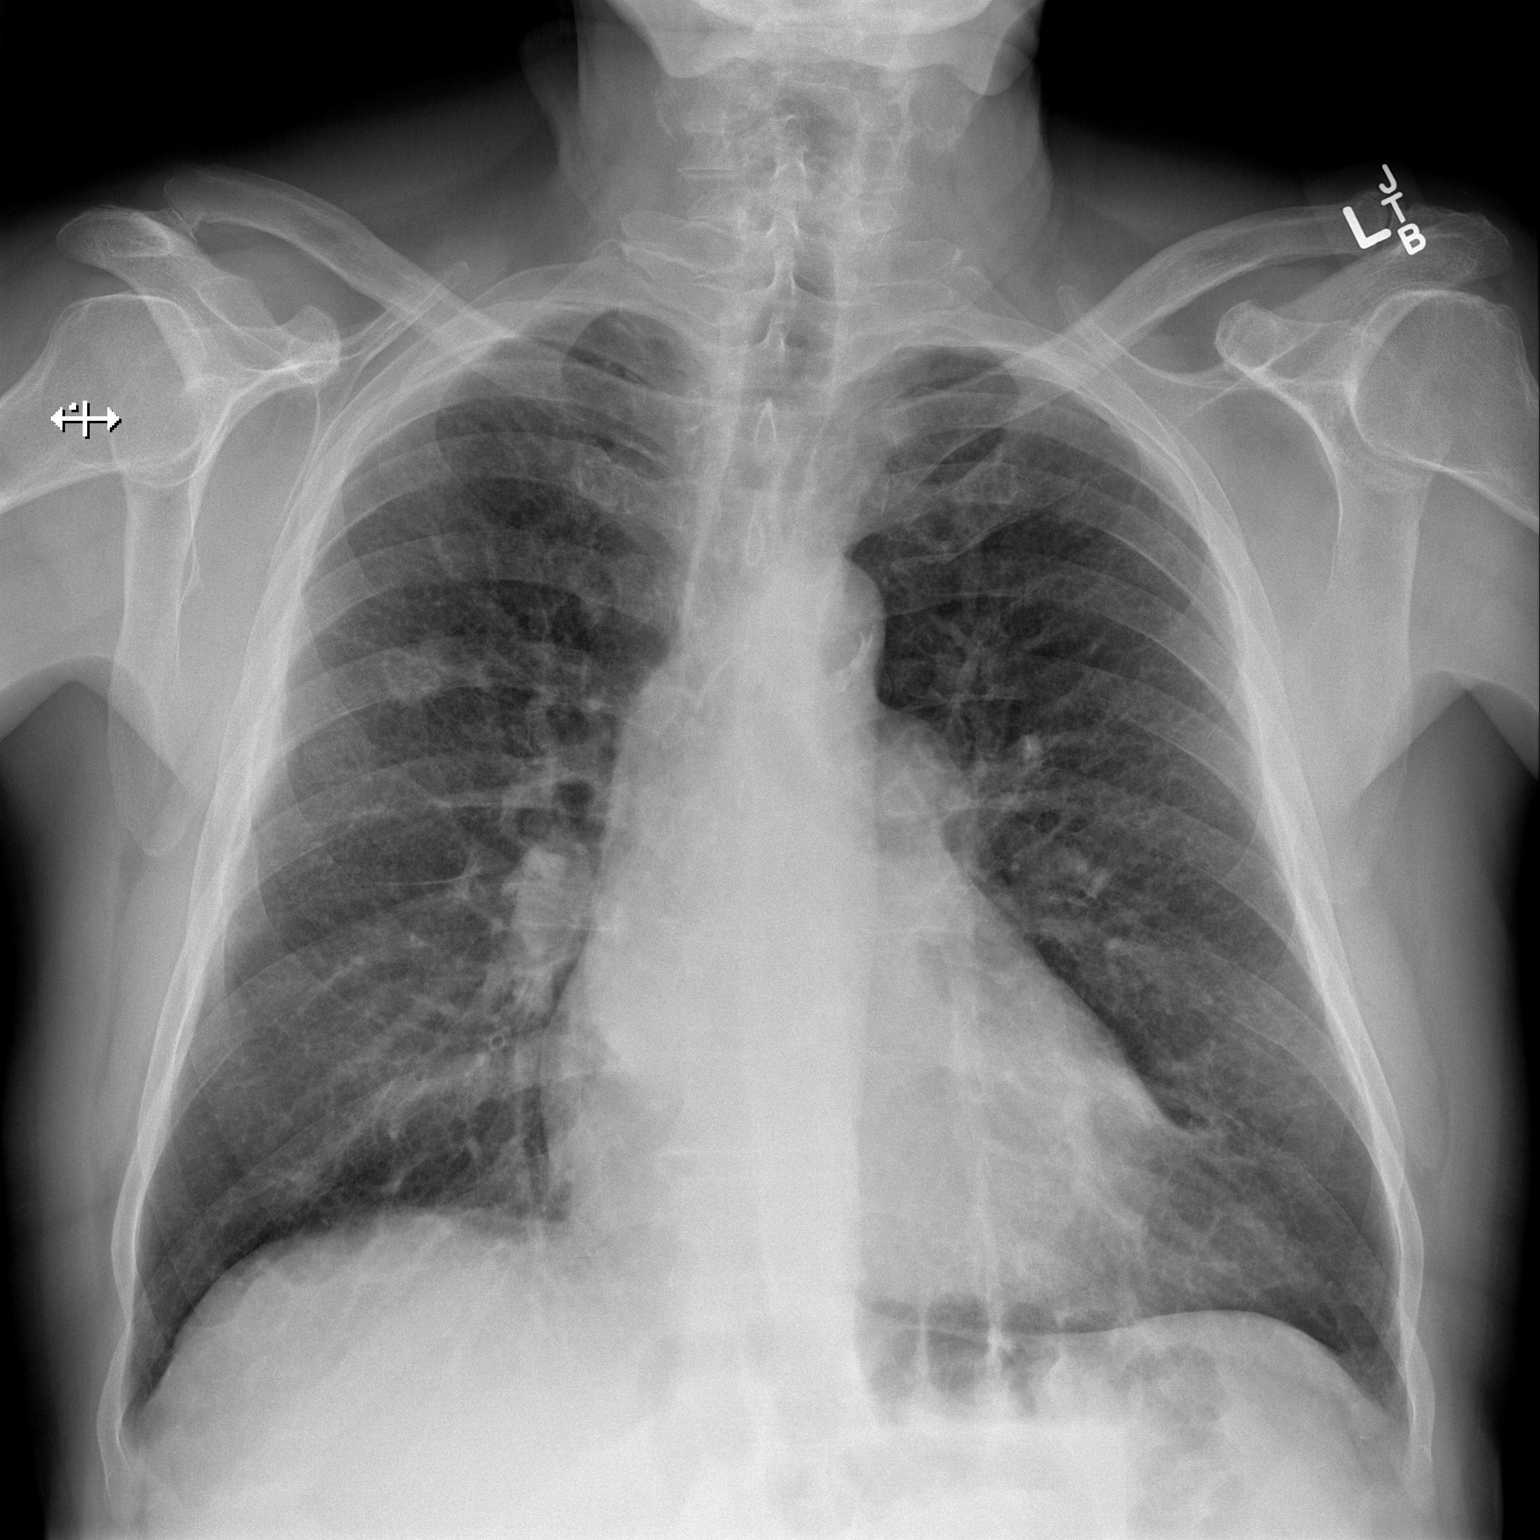

[w chest lat]
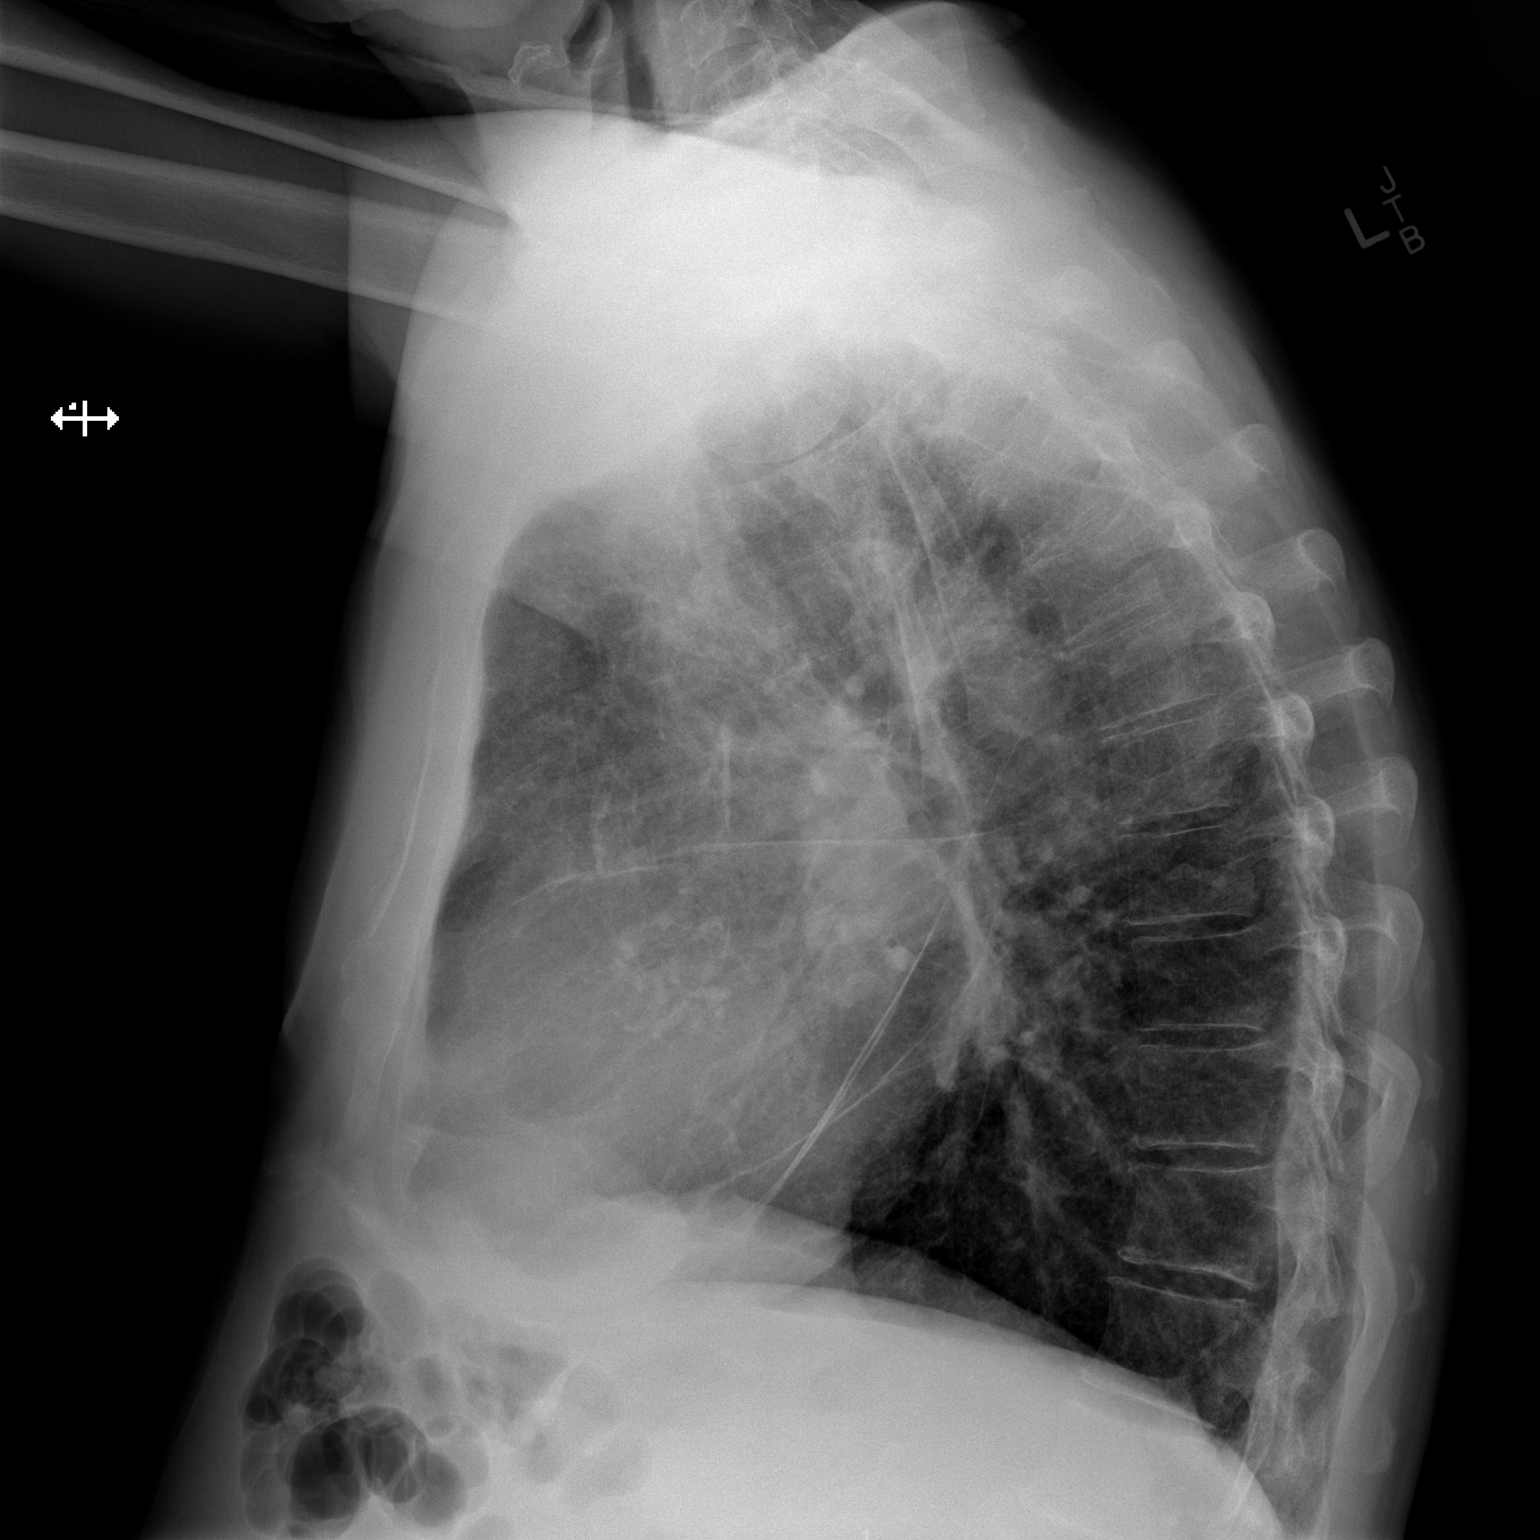

[2 of 2 positions shown; findings below may reference images not displayed]

FINDINGS: Mediastinum and hilar structures are normal. Cardiomegaly with
normal pulmonary vascularity. Persistent right upper lobe mass. No
pleural effusion or pneumothorax.
IMPRESSION: Persistent right upper lobe mass. No interim change from prior
studies.

## 2018-01-06 ENCOUNTER — Other Ambulatory Visit: Payer: Self-pay | Admitting: Physician Assistant

## 2018-01-06 DIAGNOSIS — Z952 Presence of prosthetic heart valve: Secondary | ICD-10-CM

## 2018-01-23 IMAGING — CR DG CHEST 1V PORT
1 series · 1 of 1 positions shown · non-contrast
Comparison: 03/30/2017 .

CLINICAL DATA: Chest tube.  VATS.

EXAM:
PORTABLE CHEST 1 VIEW

[AP]
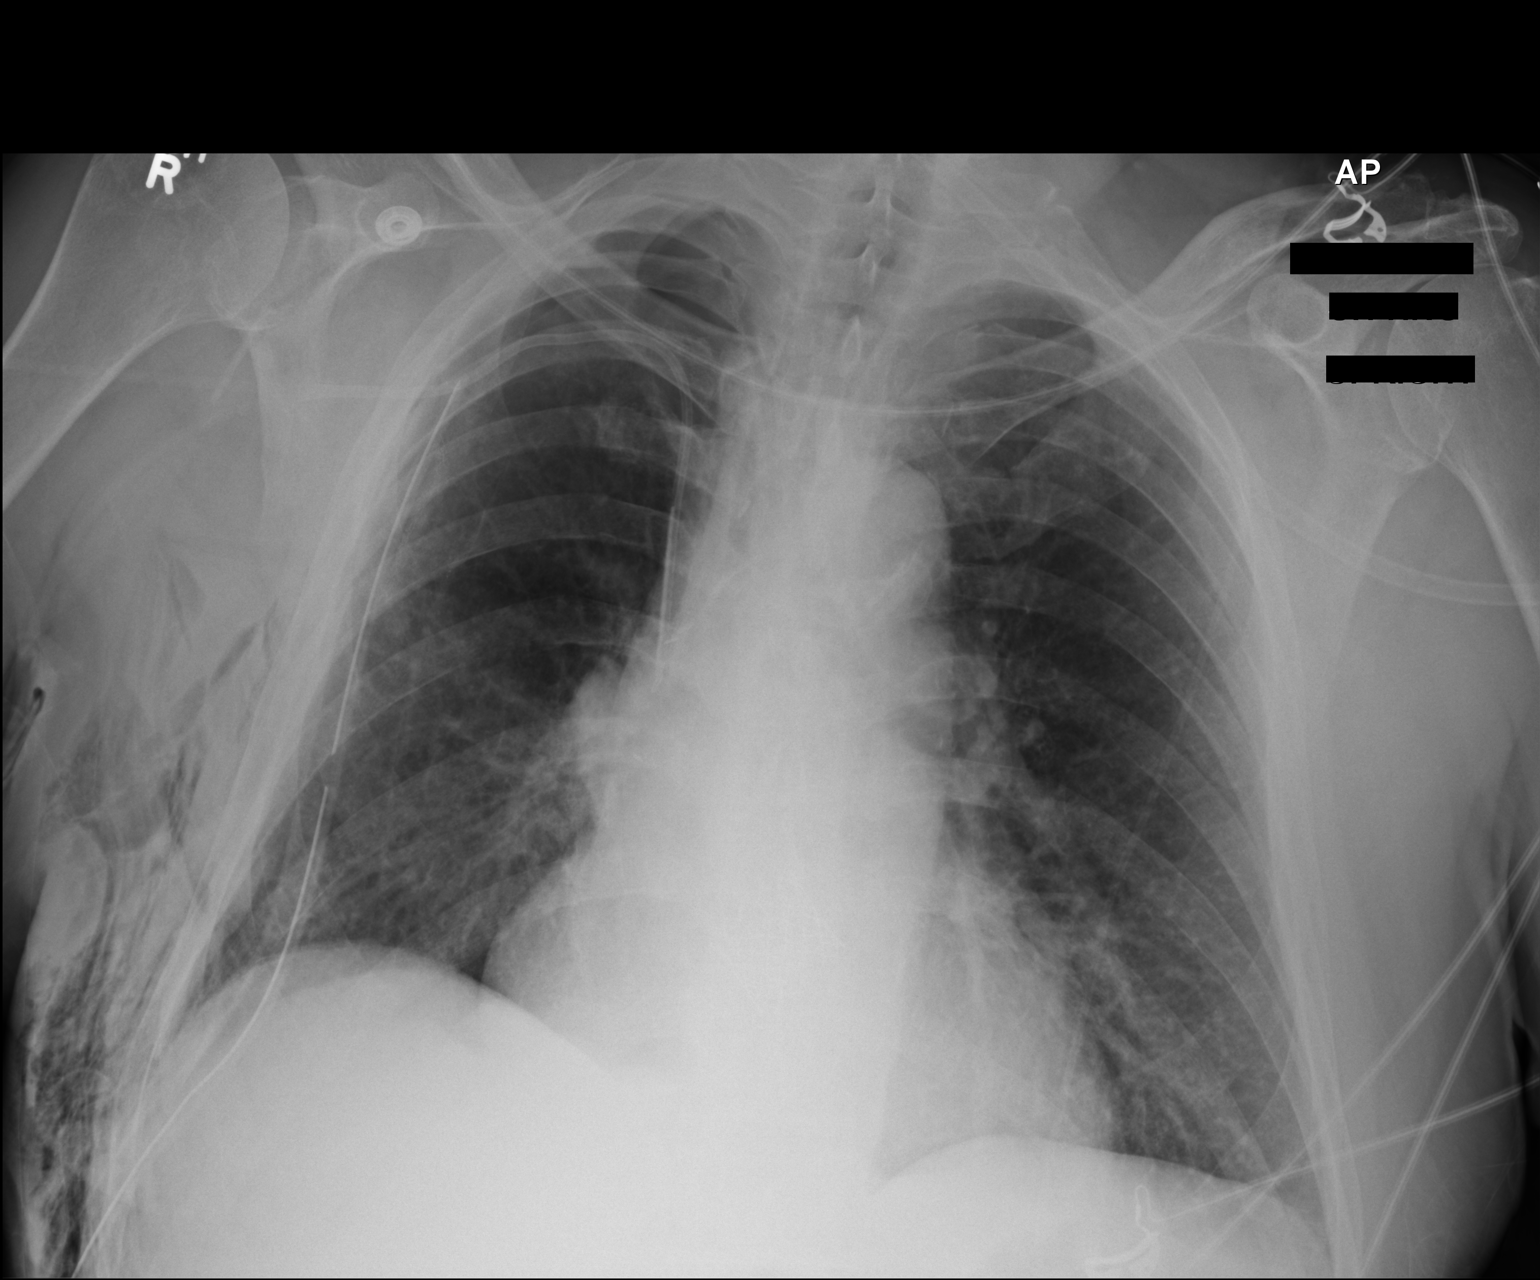

[1 of 1 positions shown; findings below may reference images not displayed]

FINDINGS: Right chest tube and right PICC line stable position. Small right
apical pneumothorax. Mediastinum hilar structures stable. Cardiac
valve replacement. Stable cardiomegaly. No pulmonary venous
congestion. Mild left base subsegmental atelectasis. No pleural
effusion or pneumothorax. Right chest wall subcutaneous emphysema
has progressed from prior exam. Carotid vascular calcification.
IMPRESSION: 1. Right chest tube and right central line in stable position. Small
right apical pneumothorax. Right chest wall subcutaneous emphysema
has progressed from prior exam.

2.  Mild mild left base subsegmental atelectasis.

3. Carotid vascular disease.

Critical Value/emergent results were called by telephone at the time
of interpretation on 03/31/2017 at [DATE] to nurse Gipser , who
verbally acknowledged these results.

## 2018-01-24 IMAGING — DX DG CHEST 1V PORT
1 series · 1 of 1 positions shown · non-contrast
Comparison: Portable chest x-ray March 31, 2017

CLINICAL DATA: Status post right upper lobectomy on March 30, 2017.

EXAM:
PORTABLE CHEST 1 VIEW

[chest ap]
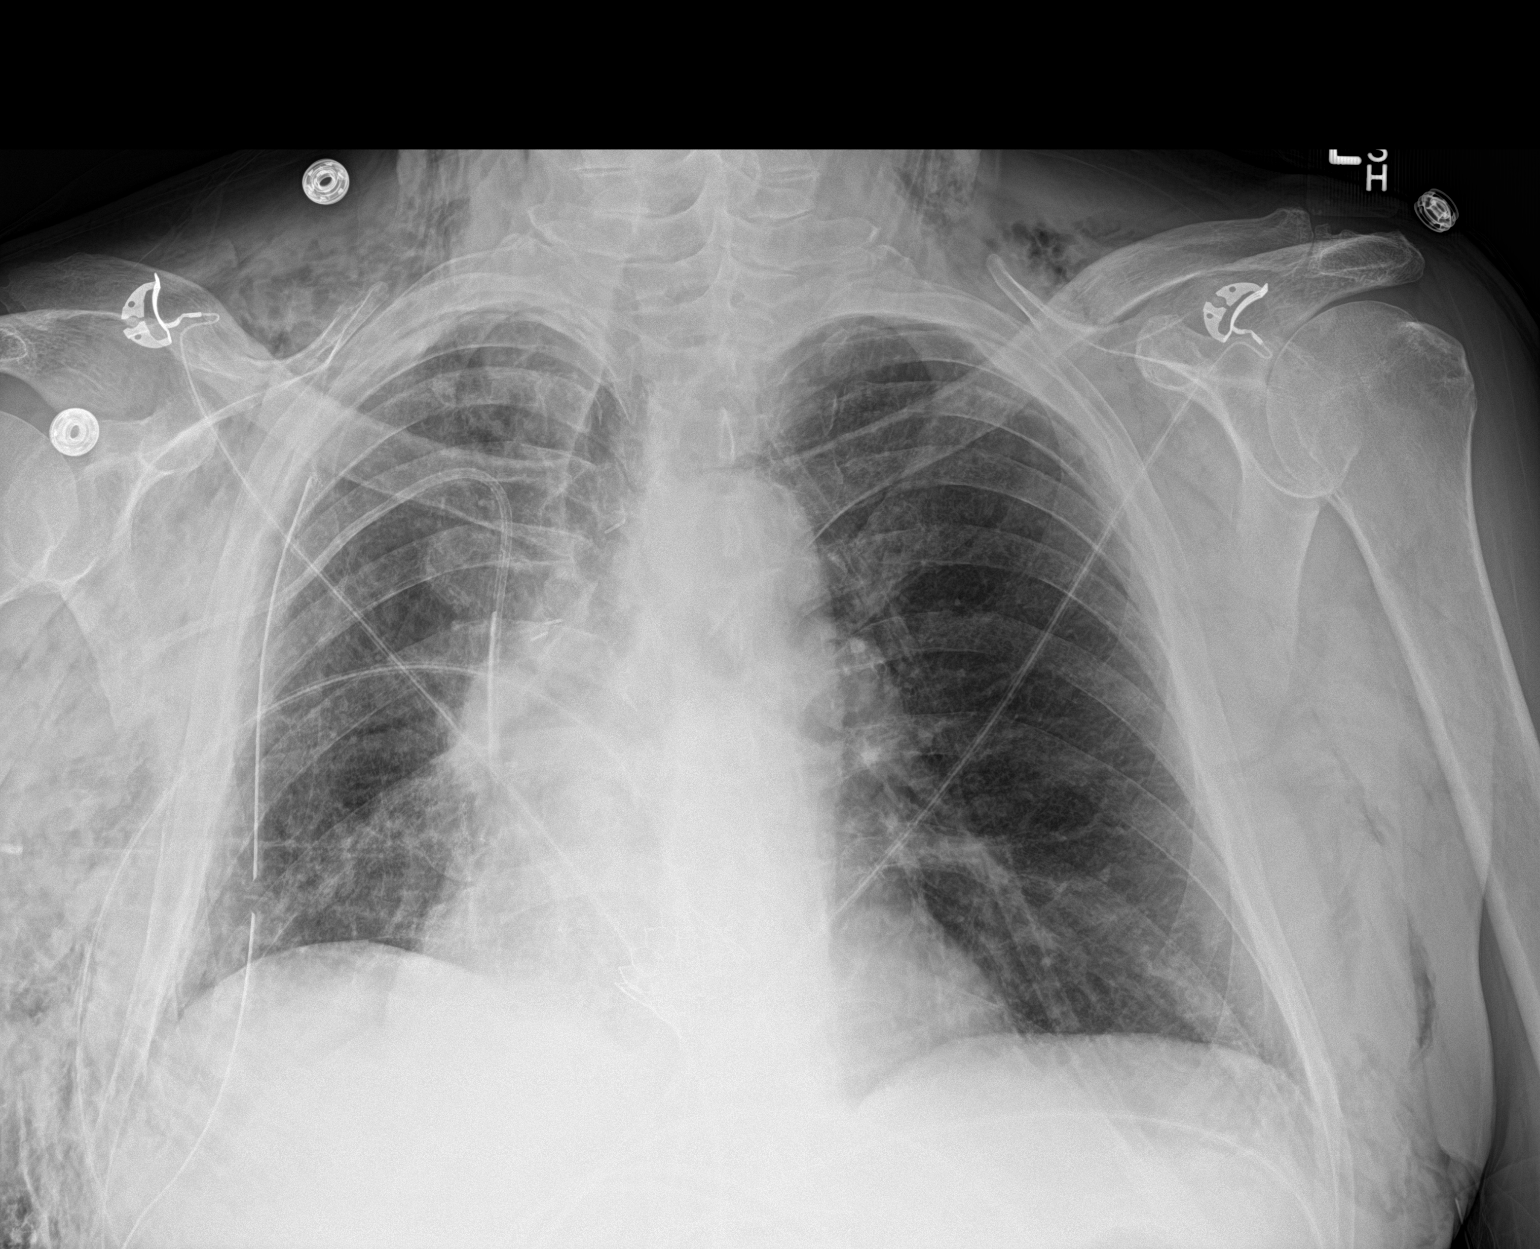

[1 of 1 positions shown; findings below may reference images not displayed]

FINDINGS: The right lung is adequately inflated. No definite pneumothorax
persists. The right chest tube is in stable position. There is no
significant pleural effusion. A large amount of subcutaneous
emphysema persists in the right axillary region and the base of the
neck. The left lung is well-expanded and clear. The heart is
top-normal in size. The pulmonary vascularity is normal. There is
calcification in the wall of the aortic arch. The right internal
jugular venous catheter tip projects over the midportion of the SVC.
IMPRESSION: Interval resolution of the right-sided pneumothorax. The right chest
tube is in stable position. Increased subcutaneous emphysema is
present today on the right and at the base of the neck bilaterally.

Thoracic aortic atherosclerosis.

## 2018-01-25 IMAGING — DX DG CHEST 1V PORT
1 series · 1 of 1 positions shown · non-contrast
Comparison: Yesterday

CLINICAL DATA: Status post lobectomy.  Chest tube.

EXAM:
PORTABLE CHEST 1 VIEW

[chest ap]
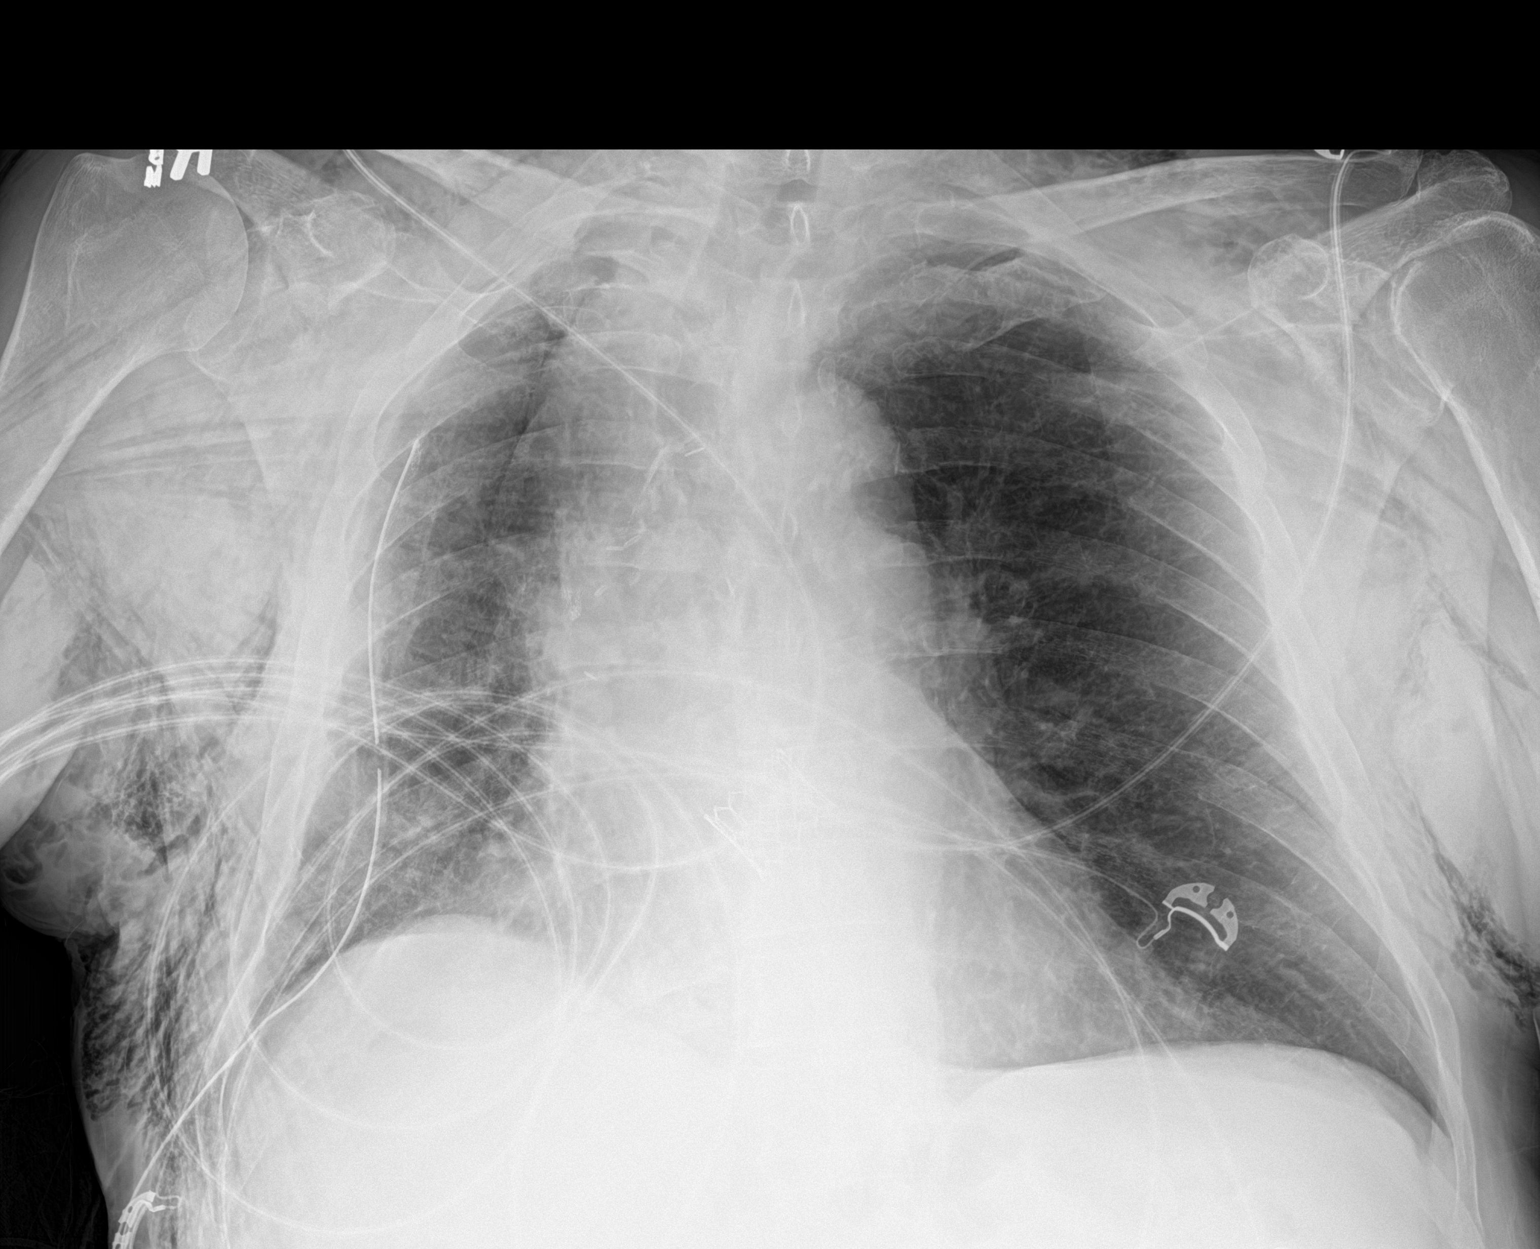

[1 of 1 positions shown; findings below may reference images not displayed]

FINDINGS: Greater volume loss on the right. Right-sided central line has been
removed. Extensive soft tissue emphysema with progression into the
left chest wall. There is a small right apical pneumothorax, 10% or
less. Right-sided chest tube in similar position. Left lung is well
inflated. Postoperative distortion of cardiomediastinal contours,
stable. Status post transcatheter aortic valve replacement.
IMPRESSION: 1. Small right apical pneumothorax. Progressive chest wall
emphysema.
2. Progressed volume loss in the postoperative right chest.

## 2018-01-26 ENCOUNTER — Other Ambulatory Visit (HOSPITAL_COMMUNITY)
Admission: RE | Admit: 2018-01-26 | Discharge: 2018-01-26 | Disposition: A | Discharge status: Home / Self Care | Payer: Medicare Other | Source: Ambulatory Visit | Attending: Urology | Admitting: Urology

## 2018-01-26 ENCOUNTER — Ambulatory Visit: Payer: Medicare Other | Admitting: Urology

## 2018-01-26 DIAGNOSIS — N390 Urinary tract infection, site not specified: Secondary | ICD-10-CM | POA: Diagnosis not present

## 2018-01-26 DIAGNOSIS — C678 Malignant neoplasm of overlapping sites of bladder: Secondary | ICD-10-CM | POA: Insufficient documentation

## 2018-02-15 ENCOUNTER — Other Ambulatory Visit: Payer: Self-pay | Admitting: Nurse Practitioner

## 2018-02-16 ENCOUNTER — Ambulatory Visit (INDEPENDENT_AMBULATORY_CARE_PROVIDER_SITE_OTHER): Payer: Medicare Other | Admitting: Nurse Practitioner

## 2018-02-16 ENCOUNTER — Encounter: Payer: Self-pay | Admitting: Nurse Practitioner

## 2018-02-16 VITALS — BP 136/74 | HR 70 | Temp 97.1°F | Ht 70.0 in | Wt 172.0 lb

## 2018-02-16 DIAGNOSIS — E782 Mixed hyperlipidemia: Secondary | ICD-10-CM | POA: Diagnosis not present

## 2018-02-16 DIAGNOSIS — I6523 Occlusion and stenosis of bilateral carotid arteries: Secondary | ICD-10-CM

## 2018-02-16 DIAGNOSIS — I10 Essential (primary) hypertension: Secondary | ICD-10-CM | POA: Diagnosis not present

## 2018-02-16 DIAGNOSIS — K219 Gastro-esophageal reflux disease without esophagitis: Secondary | ICD-10-CM

## 2018-02-16 DIAGNOSIS — Z6826 Body mass index (BMI) 26.0-26.9, adult: Secondary | ICD-10-CM

## 2018-02-16 DIAGNOSIS — J449 Chronic obstructive pulmonary disease, unspecified: Secondary | ICD-10-CM | POA: Diagnosis not present

## 2018-02-16 DIAGNOSIS — I5031 Acute diastolic (congestive) heart failure: Secondary | ICD-10-CM | POA: Diagnosis not present

## 2018-02-16 DIAGNOSIS — I482 Chronic atrial fibrillation: Secondary | ICD-10-CM | POA: Diagnosis not present

## 2018-02-16 DIAGNOSIS — E559 Vitamin D deficiency, unspecified: Secondary | ICD-10-CM

## 2018-02-16 DIAGNOSIS — N400 Enlarged prostate without lower urinary tract symptoms: Secondary | ICD-10-CM

## 2018-02-16 DIAGNOSIS — I4821 Permanent atrial fibrillation: Secondary | ICD-10-CM

## 2018-02-16 DIAGNOSIS — E034 Atrophy of thyroid (acquired): Secondary | ICD-10-CM

## 2018-02-16 LAB — CMP14+EGFR
ALT: 15 IU/L (ref 0–44)
AST: 29 IU/L (ref 0–40)
Albumin/Globulin Ratio: 1.2 (ref 1.2–2.2)
Albumin: 4.4 g/dL (ref 3.5–4.8)
Alkaline Phosphatase: 131 IU/L — ABNORMAL HIGH (ref 39–117)
BUN/Creatinine Ratio: 15 (ref 10–24)
BUN: 17 mg/dL (ref 8–27)
Bilirubin Total: 0.6 mg/dL (ref 0.0–1.2)
CO2: 24 mmol/L (ref 20–29)
Calcium: 9.3 mg/dL (ref 8.6–10.2)
Chloride: 105 mmol/L (ref 96–106)
Creatinine, Ser: 1.16 mg/dL (ref 0.76–1.27)
GFR calc Af Amer: 69 mL/min/{1.73_m2} (ref >59)
GFR calc non Af Amer: 60 mL/min/{1.73_m2} (ref >59)
Globulin, Total: 3.8 g/dL (ref 1.5–4.5)
Glucose: 103 mg/dL — ABNORMAL HIGH (ref 65–99)
Potassium: 4.3 mmol/L (ref 3.5–5.2)
Sodium: 143 mmol/L (ref 134–144)
Total Protein: 8.2 g/dL (ref 6.0–8.5)

## 2018-02-16 LAB — LIPID PANEL
CHOLESTEROL TOTAL: 98 mg/dL — AB (ref 100–199)
Chol/HDL Ratio: 3.8 ratio (ref 0.0–5.0)
HDL: 26 mg/dL — ABNORMAL LOW (ref >39)
LDL CALC: 55 mg/dL (ref 0–99)
Triglycerides: 83 mg/dL (ref 0–149)
VLDL Cholesterol Cal: 17 mg/dL (ref 5–40)

## 2018-02-16 MED ORDER — LEVOTHYROXINE SODIUM 75 MCG PO TABS: 75.0000 ug | ORAL_TABLET | Freq: Every day | ORAL | 8 refills | Status: DC

## 2018-02-16 MED ORDER — METOPROLOL TARTRATE 25 MG PO TABS: 12.5000 mg | ORAL_TABLET | Freq: Two times a day (BID) | ORAL | 6 refills | Status: DC

## 2018-02-16 MED ORDER — LISINOPRIL 40 MG PO TABS: 40.0000 mg | ORAL_TABLET | Freq: Every day | ORAL | 4 refills | Status: DC

## 2018-02-16 MED ORDER — AMLODIPINE BESYLATE 5 MG PO TABS: 5.0000 mg | ORAL_TABLET | Freq: Every day | ORAL | 4 refills | Status: DC

## 2018-02-16 MED ORDER — ATORVASTATIN CALCIUM 40 MG PO TABS: 40.0000 mg | ORAL_TABLET | Freq: Every day | ORAL | 4 refills | Status: DC

## 2018-02-16 MED ORDER — PANTOPRAZOLE SODIUM 40 MG PO TBEC: 40.0000 mg | DELAYED_RELEASE_TABLET | Freq: Two times a day (BID) | ORAL | 2 refills | Status: DC

## 2018-02-16 NOTE — Progress Notes (Signed)
Subjective:    Patient ID: [name] [name], male    DOB: 03-11-1941, 77 y.o.   MRN: [id number]   Chief Complaint: Medical Management of Chronic Issues   HPI:  1. Essential hypertension  No c/o chest pain, sob or headache. Does not check blood pressure at home. BP Readings from Last 3 Encounters:  11/23/17 118/60  11/09/17 131/65  10/27/17 132/71     2. Bilateral carotid artery stenosis  Last  Doppler study was done 07/21/14. Which showed less than 60% occluded. He says that cardiology checks his carotids out everytime he goes.   3. Permanent atrial fibrillation (HCC) is not  On blood thinner. Sees cardiology every 3 months.  4. Acute diastolic heart failure [facility]) has appointment with cardiology September 5th in [place] and this is his big follow up that he does yearly.   5. Chronic obstructive pulmonary disease, unspecified COPD type ([place]) Doing well. He wears a mask when he is outside doing yard work. . Has pulmonologist but has not seen since July of 2018. Quit smoking in 2007.  6. Hypothyroidism due to acquired atrophy of thyroid  No problems that he is aware of.  7. Benign prostatic hyperplasia without lower urinary tract symptoms  Takes uroxatral and has no problems voiding  8. Vitamin D deficiency  Takes vitamin d supplement daily  9. Mixed hyperlipidemia  eats no fried foods. No designated exercise.  10. BMI 26.0-26.9,adult  No recent weight changes  11.    GERD           Takes protonix daily. Works well to keep symptoms under control   Outpatient Encounter Medications as of 02/16/2018  Medication Sig  . alfuzosin (UROXATRAL) 10 MG 24 hr tablet Take 10 mg by mouth at bedtime.   Marland Kitchen amLODipine (NORVASC) 5 MG tablet Take 1 tablet (5 mg total) by mouth daily.  Marland Kitchen aspirin EC 81 MG tablet Take 1 tablet (81 mg total) by mouth daily.  Marland Kitchen atorvastatin (LIPITOR) 40 MG tablet Take 1 tablet (40 mg total) by mouth daily.  . cholecalciferol (VITAMIN D) 1000 UNITS tablet Take 1  tablet (1,000 Units total) by mouth daily.  . ferrous gluconate (FERGON) 324 MG tablet TAKE (1) TABLET TWICE A DAY WITH MEALS (BREAKFAST AND SUPPER)  . levothyroxine (SYNTHROID, LEVOTHROID) 75 MCG tablet Take 1 tablet (75 mcg total) by mouth daily.  Marland Kitchen lisinopril (PRINIVIL,ZESTRIL) 40 MG tablet Take 1 tablet (40 mg total) by mouth daily.  . metoprolol tartrate (LOPRESSOR) 25 MG tablet Take 0.5 tablets (12.5 mg total) by mouth 2 (two) times daily.  . Multiple Vitamin (ONE-A-DAY MENS PO) Take 1 tablet by mouth daily.    . Omega-3 Fatty Acids (FISH OIL) 1200 MG CAPS Take 2,400 mg by mouth daily.   . pantoprazole (PROTONIX) 40 MG tablet Take 1 tablet (40 mg total) by mouth 2 (two) times daily.      New complaints: None today  Social history: Wife of 82 years. She is pretty healthy   Review of Systems  Constitutional: Negative for activity change and appetite change.  HENT: Negative.   Eyes: Negative for pain.  Respiratory: Negative for shortness of breath.   Cardiovascular: Negative for chest pain, palpitations and leg swelling.  Gastrointestinal: Negative for abdominal pain.  Endocrine: Negative for polydipsia.  Genitourinary: Negative.   Skin: Negative for rash.  Neurological: Negative for dizziness, weakness and headaches.  Hematological: Does not bruise/bleed easily.  Psychiatric/Behavioral: Negative.   All other systems reviewed and  are negative.      Objective:   Physical Exam  Constitutional: He is oriented to person, place, and time. He appears well-developed and well-nourished.  HENT:  Head: Normocephalic.  Nose: Nose normal.  Mouth/Throat: Oropharynx is clear and moist.  Eyes: Pupils are equal, round, and reactive to light. EOM are normal.  Neck: Normal range of motion and phonation normal. Neck supple. No JVD present. Carotid bruit is not present. No thyroid mass and no thyromegaly present.  Cardiovascular: Normal rate and regular rhythm.  Pulmonary/Chest: Effort  normal and breath sounds normal. No respiratory distress.  Abdominal: Soft. Normal appearance, normal aorta and bowel sounds are normal. There is no tenderness.  Musculoskeletal: Normal range of motion.  Lymphadenopathy:    He has no cervical adenopathy.  Neurological: He is alert and oriented to person, place, and time.  Skin: Skin is warm and dry.  Psychiatric: He has a normal mood and affect. His behavior is normal. Judgment and thought content normal.  Nursing note and vitals reviewed.  BP 136/74   Pulse 70   Temp (!) 97.1 F (36.2 C) (Oral)   Ht _0  (1.778 m)   Wt 172 lb (78 kg)   BMI 24.68 kg/m      Assessment & Plan:  [name] [name] comes in today with chief complaint of Medical Management of Chronic Issues   Diagnosis and orders addressed:  1. Essential hypertension Low sodium diet - amLODipine (NORVASC) 5 MG tablet; Take 1 tablet (5 mg total) by mouth daily.  Dispense: 30 tablet; Refill: 4 - CMP14+EGFR  2. Bilateral carotid artery stenosis Will have carotids checked at cardiology office I september  3. Permanent atrial fibrillation ([place])  4. Acute diastolic heart failure (HCC) - metoprolol tartrate (LOPRESSOR) 25 MG tablet; Take 0.5 tablets (12.5 mg total) by mouth 2 (two) times daily.  Dispense: 30 tablet; Refill: 6  5. Chronic obstructive pulmonary disease, unspecified COPD type ([place]) Will heav foolow up soon. Had lobectomy for cancer  6. Hypothyroidism due to acquired atrophy of thyroid - levothyroxine (SYNTHROID, LEVOTHROID) 75 MCG tablet; Take 1 tablet (75 mcg total) by mouth daily.  Dispense: 30 tablet; Refill: 8  7. Benign prostatic hyperplasia without lower urinary tract symptoms Will follow up with dr. Weber Cooks in 2 months  8. Vitamin D deficiency Continue daily vitamin d supplement  9. Mixed hyperlipidemia Low fat diet - lisinopril (PRINIVIL,ZESTRIL) 40 MG tablet; Take 1 tablet (40 mg total) by mouth daily.  Dispense: 30 tablet; Refill:  4 - atorvastatin (LIPITOR) 40 MG tablet; Take 1 tablet (40 mg total) by mouth daily.  Dispense: 30 tablet; Refill: 4 - Lipid panel  10. BMI 26.0-26.9,adult Discussed diet and exercise for person with BMI >25 Will recheck weight in 3-6 months  11. Gastroesophageal reflux disease without esophagitis Avoid spicy foods Do not eat 2 hours prior to bedtime - pantoprazole (PROTONIX) 40 MG tablet; Take 1 tablet (40 mg total) by mouth 2 (two) times daily.  Dispense: 60 tablet; Refill: 2   Labs pending Health Maintenance reviewed Diet and exercise encouraged  Follow up plan: 3 months   [name] Hassell Done, FNP

## 2018-02-16 NOTE — Patient Instructions (Signed)

## 2018-02-25 ENCOUNTER — Ambulatory Visit (HOSPITAL_COMMUNITY): Payer: Medicare Other | Attending: Cardiology

## 2018-02-25 ENCOUNTER — Ambulatory Visit: Payer: Medicare Other | Admitting: Physician Assistant

## 2018-02-25 ENCOUNTER — Encounter: Payer: Self-pay | Admitting: Physician Assistant

## 2018-02-25 ENCOUNTER — Other Ambulatory Visit: Payer: Self-pay

## 2018-02-25 VITALS — BP 140/70 | HR 68 | Ht 70.0 in | Wt 170.8 lb

## 2018-02-25 DIAGNOSIS — I4821 Permanent atrial fibrillation: Secondary | ICD-10-CM

## 2018-02-25 DIAGNOSIS — Z952 Presence of prosthetic heart valve: Secondary | ICD-10-CM | POA: Diagnosis not present

## 2018-02-25 DIAGNOSIS — I5032 Chronic diastolic (congestive) heart failure: Secondary | ICD-10-CM | POA: Diagnosis not present

## 2018-02-25 DIAGNOSIS — I482 Chronic atrial fibrillation: Secondary | ICD-10-CM

## 2018-02-25 DIAGNOSIS — I10 Essential (primary) hypertension: Secondary | ICD-10-CM

## 2018-02-25 DIAGNOSIS — I2510 Atherosclerotic heart disease of native coronary artery without angina pectoris: Secondary | ICD-10-CM

## 2018-02-25 NOTE — Patient Instructions (Signed)
Medication Instructions:  Your provider recommends that you continue on your current medications as directed. Please refer to the Current Medication list given to you today.    Labwork: None  Testing/Procedures: None  Follow-Up: Call us if you need anything!  Keep your follow-up with Dr. Bronson Ing in December.  Any Other Special Instructions Will Be Listed Below (If Applicable).     If you need a refill on your cardiac medications before your next appointment, please call your pharmacy.

## 2018-02-25 NOTE — Progress Notes (Signed)
[facility]                                       Cardiology Office Note    Date:  02/26/2018   ID:  [name] [name], DOB April 20, 1941, MRN [id number]  PCP:  [name], FNP  Cardiologist: Dr. Bronson Ing / Dr. Burt Knack & Dr. Cyndia Bent (TAVR)  CC: 1 year follow up s/p TAVR  History of Present Illness:  [name] [name] is a 77 y.o. male with a history of HTN, HLD, previous heavy tobacco abuse, COPD, PAD, AAA s/p stent graft repair (2011), bladder cancer s/p TURBT, NSCLC s/p resection, chronic atrial fibrillation (not on [facility] given GI bleed) and severe AS s/p TAVR (02/24/17) who presents to clinic for follow up   Echo on 10/28/2016 showed severe AS witha mean gradient of 55 mm Hg with normal LV function. He was being evaluated by Dr. Roxan Hockey for a 17 mm spiculated nodule in the RUL that was hypermetabolic on PET scan ( found on a lung cancer screening CT on 11/10/2016.) It was felt to be most likely a lung cancer that would require surgical removal. It was felt he would not tolerate surgery with severe AS and he was referred for TAVR. Cath on 01/14/2017 showed 60% proximal LAD stenosis with mild non-obstructive disease in the LCX and RCA. Dr. Burt Knack felt that his LAD stenosis could be managed medically. He was also admitted for acute anemia and CHF. EGD on 02/21/17 showed non bleeding gastric ulcers and Eliquis discontinued.   He underwent successful TAVR with a 57mm Edwards Sapien valve via the TF approach on 02/24/17. Postop Echo 02/25/17 showed no residual ASand trivial PVL. He was started on Aspirin 81 mg and Plavix 75 mg daily. 1 month echo showed a well seated valve with no PVL and mean gradient of 12 and peak gradient of 29.  He underwent right right lobectomy for pulmonary nodule in 03/2017. He was found to have stage IA (T1a, N0, M0)non-small cell lung cancer, squamous cell carcinoma.  Today he presents to clinic for follow up. No  CP. No LE edema, orthopnea or PND. He has some dizziness due to inner ear issues but no syncope. No blood in stool or urine. No palpitations. He is very active working out in the yard and the garden. He has no issues with shortness of breath until really working vigorously like shoveling mulch.     Past Medical History:  Diagnosis Date  . AAA (abdominal aortic aneurysm) ([place])   . Aortic stenosis, severe    a. 02/2017: s/p TAVR with Edwards Sapien 3 THV (size 26 mm, model # 9600TFX, serial # [id number])  . Arthritis   . Atherosclerotic peripheral vascular disease ([place])   . Atrial fibrillation, persistent ([place])    a. not a candidate for [place] given hx of GI bleed  . Bilateral carotid artery stenosis   . COPD (chronic obstructive pulmonary disease) ([place])   . Hyperlipidemia   . Hypertension   . Hypothyroidism   . Lung cancer ([place])    Right  . Lung mass    Right  . Skin cancer   . Vitamin D deficiency     Past Surgical History:  Procedure Laterality Date  . ABDOMINAL AORTIC ANEURYSM REPAIR  08-09-09  . BACK SURGERY    . ESOPHAGOGASTRODUODENOSCOPY N/A 02/21/2017  Procedure: ESOPHAGOGASTRODUODENOSCOPY (EGD);  Surgeon: Jerene Bears, MD;  [facility] ENDOSCOPY;  Service: Gastroenterology;  Laterality: N/A;  . HYDROCELE EXCISION / REPAIR    . LOBECTOMY Right 03/30/2017   Procedure: RIGHT UPPER LOBECTOMY LUNG;  Surgeon: Melrose Nakayama, MD;  Location: [place];  Service: Thoracic;  Laterality: Right;  . SKIN CANCER EXCISION    . SPINE SURGERY    . TEE WITHOUT CARDIOVERSION N/A 02/24/2017   Procedure: TRANSESOPHAGEAL ECHOCARDIOGRAM (TEE);  Surgeon: Sherren Mocha, MD;  Location: [place];  Service: Open Heart Surgery;  Laterality: N/A;  . TRANSCATHETER AORTIC VALVE REPLACEMENT, TRANSFEMORAL N/A 02/24/2017   Procedure: TRANSCATHETER AORTIC VALVE REPLACEMENT, TRANSFEMORAL using a 27mm Edwards Sapien 3 Aortic Valve;  Surgeon: Sherren Mocha, MD;  Location: [place];  Service: Open Heart Surgery;   Laterality: N/A;  . TRANSURETHRAL RESECTION OF BLADDER TUMOR N/A 06/06/2016   Procedure: TRANSURETHRAL RESECTION OF BLADDER TUMOR (TURBT);  Surgeon: Franchot Gallo, MD;  [facility];  Service: Urology;  Laterality: N/A;  . TRANSURETHRAL RESECTION OF BLADDER TUMOR N/A 07/22/2016   Procedure: TRANSURETHRAL RESECTION OF BLADDER TUMOR (TURBT);  Surgeon: Franchot Gallo, MD;  [facility];  Service: Urology;  Laterality: N/A;  . VIDEO ASSISTED THORACOSCOPY (VATS)/WEDGE RESECTION Right 03/30/2017   Procedure: VIDEO ASSISTED THORACOSCOPY (VATS)/WEDGE RESECTION;  Surgeon: Melrose Nakayama, MD;  Location[facility];  Service: Thoracic;  Laterality: Right;    Current Medications: Outpatient Medications Prior to Visit  Medication Sig Dispense Refill  . alfuzosin (UROXATRAL) 10 MG 24 hr tablet Take 10 mg by mouth at bedtime.     Marland Kitchen amLODipine (NORVASC) 5 MG tablet Take 1 tablet (5 mg total) by mouth daily. 30 tablet 4  . aspirin EC 81 MG tablet Take 1 tablet (81 mg total) by mouth daily. 90 tablet 3  . atorvastatin (LIPITOR) 40 MG tablet Take 1 tablet (40 mg total) by mouth daily. 30 tablet 4  . cholecalciferol (VITAMIN D) 1000 UNITS tablet Take 1 tablet (1,000 Units total) by mouth daily. 30 tablet 6  . ferrous gluconate (FERGON) 324 MG tablet TAKE (1) TABLET TWICE A DAY WITH MEALS (BREAKFAST AND SUPPER) 60 tablet 3  . levothyroxine (SYNTHROID, LEVOTHROID) 75 MCG tablet Take 1 tablet (75 mcg total) by mouth daily. 30 tablet 8  . lisinopril (PRINIVIL,ZESTRIL) 40 MG tablet Take 1 tablet (40 mg total) by mouth daily. 30 tablet 4  . metoprolol tartrate (LOPRESSOR) 25 MG tablet Take 0.5 tablets (12.5 mg total) by mouth 2 (two) times daily. 30 tablet 6  . Multiple Vitamin (ONE-A-DAY MENS PO) Take 1 tablet by mouth daily.      . Omega-3 Fatty Acids (FISH OIL) 1200 MG CAPS Take 2,400 mg by mouth daily.     . pantoprazole (PROTONIX) 40 MG tablet Take 1 tablet (40 mg total) by mouth 2 (two) times  daily. 60 tablet 2   No facility-administered medications prior to visit.      Allergies:   Eliquis [apixaban] and Lovenox [enoxaparin]   Social History   Socioeconomic History  . Marital status: Married    Spouse name: Codie Krogh  . Number of children: 2  . Years of education: Not on file  . Highest education level: Not on file  Occupational History  . Occupation: Retired    Comment: Librarian, academic with [employer]  Social Needs  . Financial resource strain: Not very hard  . Food insecurity:    Worry: Never true    Inability: Never true  .  Transportation needs:    Medical: No    Non-medical: No  Tobacco Use  . Smoking status: Former Smoker    Packs/day: 1.00    Years: 51.00    Pack years: 51.00    Types: Cigarettes    Start date: 11/25/1954    Last attempt to quit: 10/27/2005    Years since quitting: 12.3  . Smokeless tobacco: Never Used  Substance and Sexual Activity  . Alcohol use: No    Alcohol/week: 0.0 standard drinks    Comment: none since 1999  . Drug use: No  . Sexual activity: Yes  Lifestyle  . Physical activity:    Days per week: 7 days    Minutes per session: 30 min  . Stress: Only a little  Relationships  . Social connections:    Talks on phone: More than three times a week    Gets together: More than three times a week    Attends religious service: More than 4 times per year    Active member of club or organization: Yes    Attends meetings of clubs or organizations: More than 4 times per year    Relationship status: Married  Other Topics Concern  . Not on file  Social History Narrative   2 sons that are deceased. 4 grandchildren and 3 great grandchildren. Lives at home with his wife.      Family History:  The patient's family history includes Alcohol abuse in his father; COPD in his mother and sister; Cancer in his mother; Gout in his brother and brother; Heart attack in his father and son; Heart disease in his father; Hypertension in his  brother; Lupus in his sister; Prostatitis in his brother.      ROS:   Please see the history of present illness.    ROS All other systems reviewed and are negative.   PHYSICAL EXAM:   VS:  BP 140/70   Pulse 68   Ht 5\' 10"  (1.778 m)   Wt 170 lb 12.8 oz (77.5 kg)   SpO2 96%   BMI 24.51 kg/m    GEN: Well nourished, well developed, in no acute distress  HEENT: normal  Neck: no JVD, carotid bruits, or masses Cardiac: irreg irreg; no murmurs, rubs, or gallops,no edema  Respiratory:  clear to auscultation bilaterally, normal work of breathing GI: soft, nontender, nondistended, + BS MS: no deformity or atrophy  Skin: warm and dry, no rash Neuro:  Alert and Oriented x 3, Strength and sensation are intact Psych: euthymic mood, full affect   Wt Readings from Last 3 Encounters:  02/25/18 170 lb 12.8 oz (77.5 kg)  02/16/18 172 lb (78 kg)  11/23/17 174 lb (78.9 kg)      Studies/Labs Reviewed:   EKG:  EKG is NOT  ordered today.    Recent Labs: 10/27/2017: TSH 3.370 11/05/2017: Hemoglobin 13.5; Platelets 162 02/16/2018: ALT 15; BUN 17; Creatinine, Ser 1.16; Potassium 4.3; Sodium 143   Lipid Panel    Component Value Date/Time   CHOL 98 (L) 02/16/2018 0901   CHOL 118 01/14/2013 1157   TRIG 83 02/16/2018 0901   TRIG 160 (H) 04/13/2014 0846   TRIG 178 (H) 01/14/2013 1157   HDL 26 (L) 02/16/2018 0901   HDL 32 (L) 04/13/2014 0846   HDL 30 (L) 01/14/2013 1157   CHOLHDL 3.8 02/16/2018 0901   LDLCALC 55 02/16/2018 0901   LDLCALC 61 04/13/2014 0846   LDLCALC 52 01/14/2013 1157    Additional studies/  records that were reviewed today include:  Date of Procedure:02/24/2017  Preoperative Diagnosis:Severe Aortic Stenosis   Postoperative Diagnosis:Same   Procedure:   Transcatheter Aortic Valve Replacement - Percutaneous Transfemoral Approach Edwards Sapien 3 THV (size 57mm, model # 9600TFX, serial #  [id number])  Co-Surgeons:Bryan Alveria Apley, MD and Sherren Mocha, MD  Anesthesiologist:Ryan Roanna Banning, MD  Dala Dock, MD  Pre-operative Echo Findings: ? Severe aortic stenosis ? Normalleft ventricular systolic function  Post-operative Echo Findings: ? Traceparavalvular leak ? Normalleft ventricular systolic function  _____________   2D ECHO: 03/25/17 (1 month s/p TAVR) Study Conclusions - Left ventricle: The cavity size was normal. There was moderate concentric hypertrophy. Systolic function was normal. The estimated ejection fraction was in the range of 55% to 60%. Wall motion was normal; there were no regional wall motion abnormalities. - Aortic valve: A TAVR 73mm Edwards Sapien 3 bioprosthesis was present and functioning normally. Placed on 02/24/17. - Left atrium: The atrium was mildly dilated. Volume/bsa, ES (1-plane Simpson&'s, A4C): 35.7 ml/m^2. - Pulmonary arteries: Systolic pressure was mildly increased. PA peak pressure: 36 mm Hg (S). Impressions: - Since prior study, EF has mildly improved.  _____________   2D ECHO: 02/25/18 (1 year s/p TAVR) Study Conclusions - Left ventricle: The cavity size was normal. Wall thickness was   normal. Systolic function was normal. The estimated ejection   fraction was in the range of 55% to 60%. Wall motion was normal;   there were no regional wall motion abnormalities. There was no   evidence of elevated ventricular filling pressure by Doppler   parameters. - Aortic valve: A 69mm Edwards Sapienn 3 TAVR bioprosthesis was   present There was trivial perivalvular regurgitation. Peak   velocity (S): 220 cm/s. Mean gradient (S): 9 mm Hg. Peak gradient   (S): 19 mm Hg. - Left atrium: The atrium was mildly dilated. Volume/bsa, ES   (1-plane Simpson&'s, A4C): 40 ml/m^2. - Right ventricle: The cavity size was mildly dilated. Wall    thickness was normal. - Right atrium: The atrium was mildly dilated. - Tricuspid valve: There was trivial regurgitation. - Pulmonary arteries: Systolic pressure was mildly increased. PA   peak pressure: 32 mm Hg (S).    ASSESSMENT & PLAN:   Severe AS s/p TAVR: doing well. 2D ECHO today shows EF 55%, normally functioning TAVR valve with trivial PVL and mean gradient 9 mm Hg. He has NHYA class I symptoms. He is on Aspirin 81mg  daily. He is aware of the need for SBE prophylaxis; he has dentures and doesn't go to the dentist.   Chronic diastolic CHF: appears euvolemic off diuretics  Persistent atrial fibrillation: rate well controlled. Not on anticoagulation given history of symptomatic anemia on Eliquis  HTN: BP well controlled today   CAD: no chest pain. Continue medical therapy w/ ASA, statin and BB  NSCLC: s/p lobectomy. Followed by Dr. Inda Merlin  Medication Adjustments/Labs and Tests Ordered: Current medicines are reviewed at length with the patient today.  Concerns regarding medicines are outlined above.  Medication changes, Labs and Tests ordered today are listed in the Patient Instructions below. Patient Instructions  Medication Instructions:  Your provider recommends that you continue on your current medications as directed. Please refer to the Current Medication list given to you today.    Labwork: None  Testing/Procedures: None  Follow-Up: Call us if you need anything!  Keep your follow-up with Dr. Bronson Ing in December.  Any Other Special Instructions Will Be Listed Below (If Applicable).  If you need a refill on your cardiac medications before your next appointment, please call your pharmacy.      Signed, Angelena Form, PA-C  02/26/2018 8:40 AM    [facility] [address], [place], [place]  [address] Phone: [phone]; Fax: [phone]

## 2018-03-15 ENCOUNTER — Inpatient Hospital Stay: Payer: Medicare Other | Attending: Internal Medicine

## 2018-03-15 ENCOUNTER — Ambulatory Visit (HOSPITAL_COMMUNITY)
Admission: RE | Admit: 2018-03-15 | Discharge: 2018-03-15 | Disposition: A | Discharge status: Home / Self Care | Payer: Medicare Other | Source: Ambulatory Visit | Attending: Internal Medicine | Admitting: Internal Medicine

## 2018-03-15 ENCOUNTER — Encounter: Payer: Self-pay | Admitting: Thoracic Surgery (Cardiothoracic Vascular Surgery)

## 2018-03-15 DIAGNOSIS — Z79899 Other long term (current) drug therapy: Secondary | ICD-10-CM | POA: Insufficient documentation

## 2018-03-15 DIAGNOSIS — C3411 Malignant neoplasm of upper lobe, right bronchus or lung: Secondary | ICD-10-CM | POA: Diagnosis not present

## 2018-03-15 DIAGNOSIS — I700 Atherosclerosis of aorta: Secondary | ICD-10-CM | POA: Insufficient documentation

## 2018-03-15 DIAGNOSIS — C3490 Malignant neoplasm of unspecified part of unspecified bronchus or lung: Secondary | ICD-10-CM | POA: Diagnosis not present

## 2018-03-15 DIAGNOSIS — K8020 Calculus of gallbladder without cholecystitis without obstruction: Secondary | ICD-10-CM | POA: Insufficient documentation

## 2018-03-15 DIAGNOSIS — J90 Pleural effusion, not elsewhere classified: Secondary | ICD-10-CM | POA: Insufficient documentation

## 2018-03-15 DIAGNOSIS — J439 Emphysema, unspecified: Secondary | ICD-10-CM | POA: Insufficient documentation

## 2018-03-15 LAB — CBC WITH DIFFERENTIAL (CANCER CENTER ONLY)
BASOS ABS: 0.1 10*3/uL (ref 0.0–0.1)
Basophils Relative: 1 %
Eosinophils Absolute: 0.2 10*3/uL (ref 0.0–0.5)
Eosinophils Relative: 4 %
HEMATOCRIT: 39.6 % (ref 38.4–49.9)
HEMOGLOBIN: 13.6 g/dL (ref 13.0–17.1)
LYMPHS PCT: 24 %
Lymphs Abs: 1.5 10*3/uL (ref 0.9–3.3)
MCH: 31.7 pg (ref 27.2–33.4)
MCHC: 34.4 g/dL (ref 32.0–36.0)
MCV: 92.2 fL (ref 79.3–98.0)
MONO ABS: 0.6 10*3/uL (ref 0.1–0.9)
Monocytes Relative: 9 %
NEUTROS ABS: 3.9 10*3/uL (ref 1.5–6.5)
NEUTROS PCT: 62 %
Platelet Count: 141 10*3/uL (ref 140–400)
RBC: 4.3 MIL/uL (ref 4.20–5.82)
RDW: 14.4 % (ref 11.0–14.6)
WBC: 6.2 10*3/uL (ref 4.0–10.3)

## 2018-03-15 LAB — CMP (CANCER CENTER ONLY)
ALBUMIN: 4 g/dL (ref 3.5–5.0)
ALT: 19 U/L (ref 0–44)
ANION GAP: 10 (ref 5–15)
AST: 27 U/L (ref 15–41)
Alkaline Phosphatase: 112 U/L (ref 38–126)
BUN: 15 mg/dL (ref 8–23)
CO2: 25 mmol/L (ref 22–32)
Calcium: 9.7 mg/dL (ref 8.9–10.3)
Chloride: 109 mmol/L (ref 98–111)
Creatinine: 1.19 mg/dL (ref 0.61–1.24)
GFR, Est AFR Am: >60 mL/min (ref >60)
GFR, Estimated: 57 mL/min — ABNORMAL LOW (ref >60)
GLUCOSE: 107 mg/dL — AB (ref 70–99)
POTASSIUM: 4.4 mmol/L (ref 3.5–5.1)
Sodium: 144 mmol/L (ref 135–145)
TOTAL PROTEIN: 8.2 g/dL — AB (ref 6.5–8.1)
Total Bilirubin: 0.7 mg/dL (ref 0.3–1.2)

## 2018-03-15 MED ORDER — IOHEXOL 300 MG/ML  SOLN
75.0000 mL | Freq: Once | INTRAMUSCULAR | Status: AC | PRN
  Administered 2018-03-15: 75 mL via INTRAVENOUS

## 2018-03-17 ENCOUNTER — Encounter: Payer: Self-pay | Admitting: Internal Medicine

## 2018-03-17 ENCOUNTER — Inpatient Hospital Stay (HOSPITAL_BASED_OUTPATIENT_CLINIC_OR_DEPARTMENT_OTHER): Payer: Medicare Other | Admitting: Internal Medicine

## 2018-03-17 ENCOUNTER — Telehealth: Payer: Self-pay | Admitting: Internal Medicine

## 2018-03-17 VITALS — BP 118/70 | HR 67 | Temp 98.8°F | Resp 18 | Ht 70.0 in | Wt 173.4 lb

## 2018-03-17 DIAGNOSIS — C3411 Malignant neoplasm of upper lobe, right bronchus or lung: Secondary | ICD-10-CM | POA: Diagnosis not present

## 2018-03-17 DIAGNOSIS — J449 Chronic obstructive pulmonary disease, unspecified: Secondary | ICD-10-CM

## 2018-03-17 DIAGNOSIS — C3490 Malignant neoplasm of unspecified part of unspecified bronchus or lung: Secondary | ICD-10-CM

## 2018-03-17 DIAGNOSIS — Z79899 Other long term (current) drug therapy: Secondary | ICD-10-CM | POA: Diagnosis not present

## 2018-03-17 NOTE — Progress Notes (Signed)
[name] [name]   Fax:([phone]  OFFICE PROGRESS NOTE  [name] [name], [name] [name] [id number]  DIAGNOSIS: Stage IA (T1a, N0, M0) non-small cell lung cancer, squamous cell carcinoma presented with right upper lobe lung nodule   PRIOR THERAPY: status post right upper lobectomy with lymph node dissection on March 30, 2017 under the care of Dr. Roxan Hockey.  CURRENT THERAPY: Observation.  INTERVAL HISTORY: [name] [name] 77 y.o. male returns to the clinic today for six-month follow-up visit accompanied by his wife.  The patient is feeling fine today with no specific complaints.  He denied having any recent chest pain, shortness of breath, cough or hemoptysis.  He denied having any fever or chills.  He has no nausea, vomiting, diarrhea or constipation.  He has no significant weight loss or night sweats.  The patient had repeat CT scan of the chest performed recently and he is here for evaluation and discussion of his discuss results.  MEDICAL HISTORY: Past Medical History:  Diagnosis Date  . AAA (abdominal aortic aneurysm) ([place])   . Aortic stenosis, severe    a. 02/2017: s/p TAVR with Edwards Sapien 3 THV (size 26 mm, model # 9600TFX, serial # [id number])  . Arthritis   . Atherosclerotic peripheral vascular disease ([place])   . Atrial fibrillation, persistent ([place])    a. not a candidate for [place] given hx of GI bleed  . Bilateral carotid artery stenosis   . COPD (chronic obstructive pulmonary disease) ([place])   . Hyperlipidemia   . Hypertension   . Hypothyroidism   . Lung cancer ([name])    Right  . Lung mass    Right  . Skin cancer   . Vitamin D deficiency     ALLERGIES:  is allergic to eliquis [apixaban] and lovenox [enoxaparin].  MEDICATIONS:  Current Outpatient Medications  Medication Sig Dispense Refill  . alfuzosin (UROXATRAL) 10 MG 24 hr tablet Take 10 mg by mouth at bedtime.     Marland Kitchen amLODipine (NORVASC)  5 MG tablet Take 1 tablet (5 mg total) by mouth daily. 30 tablet 4  . aspirin EC 81 MG tablet Take 1 tablet (81 mg total) by mouth daily. 90 tablet 3  . atorvastatin (LIPITOR) 40 MG tablet Take 1 tablet (40 mg total) by mouth daily. 30 tablet 4  . cholecalciferol (VITAMIN D) 1000 UNITS tablet Take 1 tablet (1,000 Units total) by mouth daily. 30 tablet 6  . ferrous gluconate (FERGON) 324 MG tablet TAKE (1) TABLET TWICE A DAY WITH MEALS (BREAKFAST AND SUPPER) 60 tablet 3  . levothyroxine (SYNTHROID, LEVOTHROID) 75 MCG tablet Take 1 tablet (75 mcg total) by mouth daily. 30 tablet 8  . lisinopril (PRINIVIL,ZESTRIL) 40 MG tablet Take 1 tablet (40 mg total) by mouth daily. 30 tablet 4  . metoprolol tartrate (LOPRESSOR) 25 MG tablet Take 0.5 tablets (12.5 mg total) by mouth 2 (two) times daily. 30 tablet 6  . Multiple Vitamin (ONE-A-DAY MENS PO) Take 1 tablet by mouth daily.      . Omega-3 Fatty Acids (FISH OIL) 1200 MG CAPS Take 2,400 mg by mouth daily.     . pantoprazole (PROTONIX) 40 MG tablet Take 1 tablet (40 mg total) by mouth 2 (two) times daily. 60 tablet 2   No current facility-administered medications for this visit.     SURGICAL HISTORY:  Past Surgical History:  Procedure Laterality Date  . ABDOMINAL AORTIC ANEURYSM REPAIR  08-09-09  .  BACK SURGERY    . ESOPHAGOGASTRODUODENOSCOPY N/A 02/21/2017   Procedure: ESOPHAGOGASTRODUODENOSCOPY (EGD);  Surgeon: Jerene Bears, MD;  Location[facility] ENDOSCOPY;  Service: Gastroenterology;  Laterality: N/A;  . HYDROCELE EXCISION / REPAIR    . LOBECTOMY Right 03/30/2017   Procedure: RIGHT UPPER LOBECTOMY LUNG;  Surgeon: Melrose Nakayama, MD;  Location: [place];  Service: Thoracic;  Laterality: Right;  . SKIN CANCER EXCISION    . SPINE SURGERY    . TEE WITHOUT CARDIOVERSION N/A 02/24/2017   Procedure: TRANSESOPHAGEAL ECHOCARDIOGRAM (TEE);  Surgeon: Sherren Mocha, MD;  Location: [place];  Service: Open Heart Surgery;  Laterality: N/A;  . TRANSCATHETER AORTIC  VALVE REPLACEMENT, TRANSFEMORAL N/A 02/24/2017   Procedure: TRANSCATHETER AORTIC VALVE REPLACEMENT, TRANSFEMORAL using a 43mm Edwards Sapien 3 Aortic Valve;  Surgeon: Sherren Mocha, MD;  Location: [place];  Service: Open Heart Surgery;  Laterality: N/A;  . TRANSURETHRAL RESECTION OF BLADDER TUMOR N/A 06/06/2016   Procedure: TRANSURETHRAL RESECTION OF BLADDER TUMOR (TURBT);  Surgeon: Franchot Gallo, MD;  [facility];  Service: Urology;  Laterality: N/A;  . TRANSURETHRAL RESECTION OF BLADDER TUMOR N/A 07/22/2016   Procedure: TRANSURETHRAL RESECTION OF BLADDER TUMOR (TURBT);  Surgeon: Franchot Gallo, MD;  [facility];  Service: Urology;  Laterality: N/A;  . VIDEO ASSISTED THORACOSCOPY (VATS)/WEDGE RESECTION Right 03/30/2017   Procedure: VIDEO ASSISTED THORACOSCOPY (VATS)/WEDGE RESECTION;  Surgeon: Melrose Nakayama, MD;  Location[facility];  Service: Thoracic;  Laterality: Right;    REVIEW OF SYSTEMS:  A comprehensive review of systems was negative.   PHYSICAL EXAMINATION: General appearance: alert, cooperative and no distress Head: Normocephalic, without obvious abnormality, atraumatic Neck: no adenopathy, no JVD, supple, symmetrical, trachea midline and thyroid not enlarged, symmetric, no tenderness/mass/nodules Lymph nodes: Cervical, supraclavicular, and axillary nodes normal. Resp: clear to auscultation bilaterally Back: symmetric, no curvature. ROM normal. No CVA tenderness. Cardio: regular rate and rhythm, S1, S2 normal, no murmur, click, rub or gallop GI: soft, non-tender; bowel sounds normal; no masses,  no organomegaly Extremities: extremities normal, atraumatic, no cyanosis or edema  ECOG PERFORMANCE STATUS: 1 - Symptomatic but completely ambulatory  Blood pressure 118/70, pulse 67, temperature 98.8 F (37.1 C), temperature source Oral, resp. rate 18, height 5\' 10"  (1.778 m), weight 173 lb 6.4 oz (78.7 kg), SpO2 98 %.  LABORATORY DATA: Lab Results  Component Value  Date   WBC 6.2 03/15/2018   HGB 13.6 03/15/2018   HCT 39.6 03/15/2018   MCV 92.2 03/15/2018   PLT 141 03/15/2018      Chemistry      Component Value Date/Time   NA 144 03/15/2018 0745   NA 143 02/16/2018 0901   NA 140 05/07/2017 1419   K 4.4 03/15/2018 0745   K 4.2 05/07/2017 1419   CL 109 03/15/2018 0745   CO2 25 03/15/2018 0745   CO2 25 05/07/2017 1419   BUN 15 03/15/2018 0745   BUN 17 02/16/2018 0901   BUN 14.5 05/07/2017 1419   CREATININE 1.19 03/15/2018 0745   CREATININE 1.0 05/07/2017 1419      Component Value Date/Time   CALCIUM 9.7 03/15/2018 0745   CALCIUM 9.6 05/07/2017 1419   ALKPHOS 112 03/15/2018 0745   ALKPHOS 85 05/07/2017 1419   AST 27 03/15/2018 0745   AST 23 05/07/2017 1419   ALT 19 03/15/2018 0745   ALT 14 05/07/2017 1419   BILITOT 0.7 03/15/2018 0745   BILITOT 0.48 05/07/2017 1419       RADIOGRAPHIC STUDIES: Ct Chest  W Contrast  Result Date: 03/15/2018 CLINICAL DATA:  Status post right lobectomy for pulmonary nodule. Stage IA non-small cell lung cancer. Follow-up exam. EXAM: CT CHEST WITH CONTRAST TECHNIQUE: Multidetector CT imaging of the chest was performed during intravenous contrast administration. CONTRAST:  52mL OMNIPAQUE IOHEXOL 300 MG/ML  SOLN COMPARISON:  11/05/2017 FINDINGS: Cardiovascular: The heart size appears within normal limits. Aortic atherosclerosis. No pericardial effusion identified. Mediastinum/Nodes: Normal appearance of the thyroid gland. The trachea appears patent and is midline. Normal appearance of the esophagus. The previously referenced superior anterior mediastinal lymph node measures 8 mm on today's study, image 35/2. Previously 1 cm. High right paratracheal lymph node measures 0.9 cm, image 20/2. Lungs/Pleura: Decrease in volume of right pleural effusion. Small volume of loculated pleural fluid now overlies the posterior and lateral right lower lung. No left pleural effusion. 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  Status post right upper lobectomy. No postoperative complications identified. No findings identified to suggest local tumor recurrence. Upper Abdomen: The adrenal glands appear normal. Gallstone identified. No acute findings identified within the upper abdomen. Musculoskeletal: No chest wall abnormality. No acute or significant osseous findings. IMPRESSION: 1. Stable exam. No findings identified to suggest local tumor recurrence or metastatic disease status post right upper lobectomy. 2. 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 3. Previously referenced superior mediastinal lymph node has decreased in size from previous exam. Now 8 mm. 4. Gallstone. 5. Aortic Atherosclerosis (ICD10-I70.0) and Emphysema (ICD10-J43.9). Electronically Signed   By: Kerby Moors M.D.   On: 03/15/2018 11:45    ASSESSMENT AND PLAN: This is a very pleasant 77 years old white male with a stage IA non-small cell lung cancer status post right upper lobectomy with lymph node dissection in October 2018. He is currently on observation and feeling fine. Repeat CT scan of the chest performed recently showed no concerning findings for disease recurrence. I discussed the scan results with the patient and his wife and recommended for him to continue on observation with repeat CT scan of the chest in 6 months. He was advised to call immediately if he has any concerning symptoms in the interval. The patient voices understanding of current disease status and treatment options and is in agreement with the current care plan.  All questions were answered. The patient knows to call the clinic with any problems, questions or concerns. We can certainly see the patient much sooner if necessary.  I spent 10 minutes counseling the patient face to face. The total time spent in the appointment was 15 minutes.  Disclaimer: This note was dictated with voice recognition software. Similar sounding words  can inadvertently be transcribed and may not be corrected upon review.

## 2018-03-17 NOTE — Telephone Encounter (Signed)
Gave pt avs and calendar  °

## 2018-05-24 ENCOUNTER — Ambulatory Visit (INDEPENDENT_AMBULATORY_CARE_PROVIDER_SITE_OTHER): Payer: Medicare Other | Admitting: Nurse Practitioner

## 2018-05-24 ENCOUNTER — Encounter: Payer: Self-pay | Admitting: Cardiovascular Disease

## 2018-05-24 ENCOUNTER — Encounter: Payer: Self-pay | Admitting: Nurse Practitioner

## 2018-05-24 ENCOUNTER — Ambulatory Visit: Payer: Medicare Other | Admitting: Cardiovascular Disease

## 2018-05-24 VITALS — BP 129/73 | HR 50 | Ht 70.0 in | Wt 176.0 lb

## 2018-05-24 VITALS — BP 165/86 | HR 65 | Temp 97.3°F | Ht 70.0 in | Wt 175.0 lb

## 2018-05-24 DIAGNOSIS — E782 Mixed hyperlipidemia: Secondary | ICD-10-CM

## 2018-05-24 DIAGNOSIS — J449 Chronic obstructive pulmonary disease, unspecified: Secondary | ICD-10-CM

## 2018-05-24 DIAGNOSIS — E785 Hyperlipidemia, unspecified: Secondary | ICD-10-CM

## 2018-05-24 DIAGNOSIS — I4821 Permanent atrial fibrillation: Secondary | ICD-10-CM

## 2018-05-24 DIAGNOSIS — C3491 Malignant neoplasm of unspecified part of right bronchus or lung: Secondary | ICD-10-CM | POA: Insufficient documentation

## 2018-05-24 DIAGNOSIS — I6523 Occlusion and stenosis of bilateral carotid arteries: Secondary | ICD-10-CM

## 2018-05-24 DIAGNOSIS — E034 Atrophy of thyroid (acquired): Secondary | ICD-10-CM

## 2018-05-24 DIAGNOSIS — Z952 Presence of prosthetic heart valve: Secondary | ICD-10-CM

## 2018-05-24 DIAGNOSIS — I5031 Acute diastolic (congestive) heart failure: Secondary | ICD-10-CM | POA: Diagnosis not present

## 2018-05-24 DIAGNOSIS — K219 Gastro-esophageal reflux disease without esophagitis: Secondary | ICD-10-CM

## 2018-05-24 DIAGNOSIS — I5032 Chronic diastolic (congestive) heart failure: Secondary | ICD-10-CM

## 2018-05-24 DIAGNOSIS — Z8739 Personal history of other diseases of the musculoskeletal system and connective tissue: Secondary | ICD-10-CM | POA: Diagnosis not present

## 2018-05-24 DIAGNOSIS — N400 Enlarged prostate without lower urinary tract symptoms: Secondary | ICD-10-CM

## 2018-05-24 DIAGNOSIS — I2510 Atherosclerotic heart disease of native coronary artery without angina pectoris: Secondary | ICD-10-CM | POA: Diagnosis not present

## 2018-05-24 DIAGNOSIS — Z95828 Presence of other vascular implants and grafts: Secondary | ICD-10-CM

## 2018-05-24 DIAGNOSIS — I10 Essential (primary) hypertension: Secondary | ICD-10-CM | POA: Diagnosis not present

## 2018-05-24 DIAGNOSIS — I739 Peripheral vascular disease, unspecified: Secondary | ICD-10-CM

## 2018-05-24 DIAGNOSIS — E559 Vitamin D deficiency, unspecified: Secondary | ICD-10-CM

## 2018-05-24 DIAGNOSIS — Z6826 Body mass index (BMI) 26.0-26.9, adult: Secondary | ICD-10-CM

## 2018-05-24 MED ORDER — PANTOPRAZOLE SODIUM 40 MG PO TBEC: 40.0000 mg | DELAYED_RELEASE_TABLET | Freq: Two times a day (BID) | ORAL | 2 refills | Status: DC

## 2018-05-24 MED ORDER — LISINOPRIL 40 MG PO TABS: 40.0000 mg | ORAL_TABLET | Freq: Every day | ORAL | 4 refills | Status: DC

## 2018-05-24 MED ORDER — METOPROLOL TARTRATE 25 MG PO TABS: 12.5000 mg | ORAL_TABLET | Freq: Two times a day (BID) | ORAL | 6 refills | Status: DC

## 2018-05-24 MED ORDER — ATORVASTATIN CALCIUM 40 MG PO TABS: 40.0000 mg | ORAL_TABLET | Freq: Every day | ORAL | 4 refills | Status: DC

## 2018-05-24 MED ORDER — FERROUS GLUCONATE 324 (38 FE) MG PO TABS: ORAL_TABLET | ORAL | 3 refills | Status: DC

## 2018-05-24 MED ORDER — LEVOTHYROXINE SODIUM 75 MCG PO TABS: 75.0000 ug | ORAL_TABLET | Freq: Every day | ORAL | 8 refills | Status: DC

## 2018-05-24 MED ORDER — AMLODIPINE BESYLATE 5 MG PO TABS: 5.0000 mg | ORAL_TABLET | Freq: Every day | ORAL | 4 refills | Status: DC

## 2018-05-24 NOTE — Patient Instructions (Signed)
Uric Acid Test Why am I having this test? Uric acid is a chemical that results when certain substances in your body (purines) break down. You have purines in all your cells. When cells die, they release purines into your blood. You can also get purines from your diet. They are found in foods such as liver, mackerel, beans, and beer. Uric acid is mostly removed from your body by your kidneys. If your body is making too much uric acid, or if your kidneys are not removing it, a crystal form of uric acid can build up in your joints or kidneys. Uric acid crystals in your joints can cause a type of arthritis (gout). Crystals in your urine can form kidney stones. What kind of sample is taken? Uric acid can be measured in blood and urine, so you may have either a blood test or a urine test.  Blood test. This test requires a blood sample taken from a vein in your hand or arm. You may have this test if you have joint pain that may be due to gout. You may also have this test if you are getting a type of cancer treatment that increases cell death and purines. This can lead to high uric acid levels.  Urine test. This test requires a sample of urine. You may need to collect all the urine you produce over a 24-hour period (24-hour urine sample). You may have the urine test if you have kidney stones. Finding out how much uric acid you are passing in your urine can help your health care provider determine the cause of your kidney stones.  How do I prepare for this test?  You may not be able to eat for 4 hours before the blood test or as directed by your health care provider.  Let your health care provider know if: ? You have recently exercised a lot. Heavy exercise can increase uric acid levels. ? You are taking any medicines. Some medicines can also affect uric acid. What do the results mean? Your results will be compared with a reference range of values for this test. The reference ranges for the blood test are  as follows:  Adult: ? Male: 4.0-8.5 mg/dL or 0.24-0.51 mmol/L. ? Male: 2.7-7.3 mg/dL or 0.16-0.43 mmol/L.  Child: 2.5-5.5 mg/dL or 0.12-0.32 mmol/L.  Newborn: 2.0-6.2 mg/dL.  The reference range for the urine test is 250-750 mg per 24 hours or 1.48-4.43 mmol per day (SI units). Abnormally high levels of uric acid may mean:  Your body is making too much uric acid.  Your kidneys are not removing enough uric acid.  You may need to have other tests. Many things can cause a high level of uric acid. Common causes include:  Gout.  Kidney disease.  Cancer or cancer treatment.  A diet high in purines.  Alcohol abuse.  Diabetes.  Certain medicines can cause low levels of uric acid, but this is usually not a cause for concern. Reference rangesmay vary among different labs and hospitals. Talk to your health care provider to discuss your results, treatment options, and if necessary, the need for more tests. It is your responsibility to obtain your test results. Ask the lab or department performing the test when and how you will get your results. Talk with your health care provider if you have any questions about your results. Talk with your health care provider to discuss your results, treatment options, and if necessary, the need for more tests. Talk with your health care  provider if you have any questions about your results. This information is not intended to replace advice given to you by your health care provider. Make sure you discuss any questions you have with your health care provider. Document Released: 07/04/2004 Document Revised: 02/13/2016 Document Reviewed: 10/03/2013 Elsevier Interactive Patient Education  [employer].

## 2018-05-24 NOTE — Progress Notes (Signed)
Subjective:    Patient ID: [name] [name], male    DOB: 21-Jun-1941, 77 y.o.   MRN: [id number]   Chief Complaint: Medical Management of Chronic Issues   HPI:  1. Permanent atrial fibrillation  The  Only thing he is currently on is metoprolol. He is currently not on a blood thinner because eof anemia from eliquis.  2. Bilateral carotid artery stenosis  According to chart the last u/s of carotids was done in 2016.  3. Essential hypertension  Mo c/o chest pain, sob or headaches. Does not check blood pressure at home. BP Readings from Last 3 Encounters:  05/24/18 (!) 165/86  03/17/18 118/70  02/25/18 140/70     4. Acute diastolic heart failure ([place])  Last saw cardiology on 02/25/18. On that day he was doing well and no changes were made to plan of care.   5. Chronic obstructive pulmonary disease, unspecified COPD type  ([place]) currently doing well. No sob to speak of  6. Hypothyroidism due to acquired atrophy of thyroid  No problems that he is aware of.  7. Benign prostatic hyperplasia without lower urinary tract symptoms Denies any recent problems with urination   8. BMI 26.0-26.9,adult  No recent weight changes  9. Mixed hyperlipidemia  Has not been watching diet but does try to stya very active.  10. Vitamin D deficiency  On daily vitamin d supplement  11.    Ling cancer- right          Had right lobectomy, had to have no other treatments 12.    Bladder cancer          In 2017 had bladder suregry and 18 treatmenyts but was not chemo.           He was turned loose and only follow s up every 6 months.   Outpatient Encounter Medications as of 05/24/2018  Medication Sig  . alfuzosin (UROXATRAL) 10 MG 24 hr tablet Take 10 mg by mouth at bedtime.   Marland Kitchen amLODipine (NORVASC) 5 MG tablet Take 1 tablet (5 mg total) by mouth daily.  Marland Kitchen aspirin EC 81 MG tablet Take 1 tablet (81 mg total) by mouth daily.  Marland Kitchen atorvastatin (LIPITOR) 40 MG tablet Take 1 tablet (40 mg total) by mouth daily.    . cholecalciferol (VITAMIN D) 1000 UNITS tablet Take 1 tablet (1,000 Units total) by mouth daily.  . ferrous gluconate (FERGON) 324 MG tablet TAKE (1) TABLET TWICE A DAY WITH MEALS (BREAKFAST AND SUPPER)  . levothyroxine (SYNTHROID, LEVOTHROID) 75 MCG tablet Take 1 tablet (75 mcg total) by mouth daily.  Marland Kitchen lisinopril (PRINIVIL,ZESTRIL) 40 MG tablet Take 1 tablet (40 mg total) by mouth daily.  . metoprolol tartrate (LOPRESSOR) 25 MG tablet Take 0.5 tablets (12.5 mg total) by mouth 2 (two) times daily.  . Multiple Vitamin (ONE-A-DAY MENS PO) Take 1 tablet by mouth daily.    . Omega-3 Fatty Acids (FISH OIL) 1200 MG CAPS Take 2,400 mg by mouth daily.   . pantoprazole (PROTONIX) 40 MG tablet Take 1 tablet (40 mg total) by mouth 2 (two) times daily.       New complaints: None today  Social history: Wife still living and is in good health    Review of Systems  Constitutional: Negative for activity change and appetite change.  HENT: Negative.   Eyes: Negative for pain.  Respiratory: Negative for shortness of breath.   Cardiovascular: Negative for chest pain, palpitations and leg swelling.  Gastrointestinal: Negative for  abdominal pain.  Endocrine: Negative for polydipsia.  Genitourinary: Negative.   Skin: Negative for rash.  Neurological: Negative for dizziness, weakness and headaches.  Hematological: Does not bruise/bleed easily.  Psychiatric/Behavioral: Negative.   All other systems reviewed and are negative.      Objective:   Physical Exam  Constitutional: He is oriented to person, place, and time. He appears well-developed and well-nourished.  HENT:  Head: Normocephalic.  Nose: Nose normal.  Mouth/Throat: Oropharynx is clear and moist.  Eyes: Pupils are equal, round, and reactive to light. EOM are normal.  Neck: Normal range of motion and phonation normal. Neck supple. No JVD present. Carotid bruit is not present. No thyroid mass and no thyromegaly present.   Cardiovascular: Normal rate and regular rhythm.  Pulmonary/Chest: Effort normal and breath sounds normal. No respiratory distress.  Abdominal: Soft. Normal appearance, normal aorta and bowel sounds are normal. There is no tenderness.  Musculoskeletal: Normal range of motion.  Lymphadenopathy:    He has no cervical adenopathy.  Neurological: He is alert and oriented to person, place, and time.  Skin: Skin is warm and dry.  Psychiatric: He has a normal mood and affect. His behavior is normal. Judgment and thought content normal.  Nursing note and vitals reviewed.  BP (!) 165/86   Pulse 65   Temp (!) 97.3 F (36.3 C) (Oral)   Ht '5\' 10"'$  (1.778 m)   Wt 175 lb (79.4 kg)   BMI 25.11 kg/m        Assessment & Plan:  AUTHOR HATLESTAD comes in today with chief complaint of Medical Management of Chronic Issues (Wants bloodwork for gout)   Diagnosis and orders addressed:  1. Permanent atrial fibrillation contniue metoprolol See cardiology every 6 months  2. Bilateral carotid artery stenosis  3. Essential hypertension Low sodium diet - amLODipine (NORVASC) 5 MG tablet; Take 1 tablet (5 mg total) by mouth daily.  Dispense: 30 tablet; Refill: 4 - CMP14+EGFR  4. Acute diastolic heart failure ([place]) Again keep follow ups with cardiology - metoprolol tartrate (LOPRESSOR) 25 MG tablet; Take 0.5 tablets (12.5 mg total) by mouth 2 (two) times daily.  Dispense: 30 tablet; Refill: 6  5. Chronic obstructive pulmonary disease, unspecified COPD type ([place]) Keep followup with DR. Mohammad  6. Hypothyroidism due to acquired atrophy of thyroid - levothyroxine (SYNTHROID, LEVOTHROID) 75 MCG tablet; Take 1 tablet (75 mcg total) by mouth daily.  Dispense: 30 tablet; Refill: 8 - Thyroid Panel With TSH  7. Benign prostatic hyperplasia without lower urinary tract symptoms Keep follow up with urology  8. BMI 26.0-26.9,adult Discussed diet and exercise for person with BMI >25 Will recheck weight  in 3-6 months  9. Mixed hyperlipidemia loe fat diet - lisinopril (PRINIVIL,ZESTRIL) 40 MG tablet; Take 1 tablet (40 mg total) by mouth daily.  Dispense: 30 tablet; Refill: 4 - atorvastatin (LIPITOR) 40 MG tablet; Take 1 tablet (40 mg total) by mouth daily.  Dispense: 30 tablet; Refill: 4 - Lipid panel  10. Vitamin D deficiency  11. Primary lung cancer, right ([place]) Keep follow up with DR.Marland Kitchen Mohammad  12. Gastroesophageal reflux disease without esophagitis Avoid spicy foods Do not eat 2 hours prior to bedtime - pantoprazole (PROTONIX) 40 MG tablet; Take 1 tablet (40 mg total) by mouth 2 (two) times daily.  Dispense: 60 tablet; Refill: 2  13. Hx of gout Wants labs checked - Uric Acid   Labs pending Health Maintenance reviewed Diet and exercise encouraged  Follow up plan: 6 months  [name] Hassell Done, FNP

## 2018-05-24 NOTE — Progress Notes (Signed)
SUBJECTIVE: The patient presents for routine follow-up. He underwent TAVR in September 2018.   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 Coronary angiography on 01/14/17 showed 60% proximal LAD stenosis with mild nonobstructive disease in the circumflex and RCA. It was decided to manage this medically. He also has a history of COPD, abdominal aortic aneurysm status post stent graft repair in 2011, and atrial fibrillation.  He has a history of high-grade, non-muscle invasive bladder cancer and underwent surgery on 06/06/16.  Most recent echocardiogram on 03/25/17 demonstrated normally functioning prosthetic aortic valve with normal left ventricular systolic function and regional wall motion, LVEF 55-60%.  He developed GI bleeding on Eliquis and this was discontinued.   He and his wife have been married for over 77 years.  ECG performed in the office today which I ordered and personally interpreted demonstrates rate controlled atrial fibrillation, 57 bpm, with nonspecific T wave abnormalities.  He is feeling well and denies chest pain and palpitations as well as leg swelling.  He has chronic exertional dyspnea which is stable.  He is still able to perform outdoor activities such as using his chainsaw if he does things in moderation and paces himself.    Review of Systems: As per "subjective", otherwise negative.  Allergies  Allergen Reactions  . Eliquis [Apixaban] Other (See Comments)    Eliquis and lovenox caused his " ulcer to bleed."  . Lovenox [Enoxaparin]     HIT panel ordered 10/10    Current Outpatient Medications  Medication Sig Dispense Refill  . alfuzosin (UROXATRAL) 10 MG 24 hr tablet Take 10 mg by mouth at bedtime.     Marland Kitchen amLODipine (NORVASC) 5 MG tablet Take 1 tablet (5 mg total) by mouth daily. 30 tablet 4  . aspirin EC 81 MG tablet Take 1 tablet (81 mg total) by mouth daily. 90 tablet 3  . atorvastatin (LIPITOR) 40 MG  tablet Take 1 tablet (40 mg total) by mouth daily. 30 tablet 4  . cholecalciferol (VITAMIN D) 1000 UNITS tablet Take 1 tablet (1,000 Units total) by mouth daily. 30 tablet 6  . ferrous gluconate (FERGON) 324 MG tablet TAKE (1) TABLET TWICE A DAY WITH MEALS (BREAKFAST AND SUPPER) 60 tablet 3  . levothyroxine (SYNTHROID, LEVOTHROID) 75 MCG tablet Take 1 tablet (75 mcg total) by mouth daily. 30 tablet 8  . lisinopril (PRINIVIL,ZESTRIL) 40 MG tablet Take 1 tablet (40 mg total) by mouth daily. 30 tablet 4  . metoprolol tartrate (LOPRESSOR) 25 MG tablet Take 0.5 tablets (12.5 mg total) by mouth 2 (two) times daily. 30 tablet 6  . Multiple Vitamin (ONE-A-DAY MENS PO) Take 1 tablet by mouth daily.      . Omega-3 Fatty Acids (FISH OIL) 1200 MG CAPS Take 2,400 mg by mouth daily.     . pantoprazole (PROTONIX) 40 MG tablet Take 1 tablet (40 mg total) by mouth 2 (two) times daily. 60 tablet 2   No current facility-administered medications for this visit.     Past Medical History:  Diagnosis Date  . AAA (abdominal aortic aneurysm) ([place])   . Aortic stenosis, severe    a. 02/2017: s/p TAVR with Edwards Sapien 3 THV (size 26 mm, model # 9600TFX, serial # [id number])  . Arthritis   . Atherosclerotic peripheral vascular disease ([place])   . Atrial fibrillation, persistent    a. not a candidate for [place] given hx of GI bleed  . Bilateral carotid artery  stenosis   . COPD (chronic obstructive pulmonary disease) ([place])   . Hyperlipidemia   . Hypertension   . Hypothyroidism   . Lung cancer ([place])    Right  . Lung mass    Right  . Skin cancer   . Vitamin D deficiency     Past Surgical History:  Procedure Laterality Date  . ABDOMINAL AORTIC ANEURYSM REPAIR  08-09-09  . BACK SURGERY    . ESOPHAGOGASTRODUODENOSCOPY N/A 02/21/2017   Procedure: ESOPHAGOGASTRODUODENOSCOPY (EGD);  Surgeon: Jerene Bears, MD;  Location[facility] ENDOSCOPY;  Service: Gastroenterology;  Laterality: N/A;  . HYDROCELE EXCISION / REPAIR    .  LOBECTOMY Right 03/30/2017   Procedure: RIGHT UPPER LOBECTOMY LUNG;  Surgeon: Melrose Nakayama, MD;  Location: [facility];  Service: Thoracic;  Laterality: Right;  . SKIN CANCER EXCISION    . SPINE SURGERY    . TEE WITHOUT CARDIOVERSION N/A 02/24/2017   Procedure: TRANSESOPHAGEAL ECHOCARDIOGRAM (TEE);  Surgeon: Sherren Mocha, MD;  Location: [facility];  Service: Open Heart Surgery;  Laterality: N/A;  . TRANSCATHETER AORTIC VALVE REPLACEMENT, TRANSFEMORAL N/A 02/24/2017   Procedure: TRANSCATHETER AORTIC VALVE REPLACEMENT, TRANSFEMORAL using a 76mm Edwards Sapien 3 Aortic Valve;  Surgeon: Sherren Mocha, MD;  Location: [facility];  Service: Open Heart Surgery;  Laterality: N/A;  . TRANSURETHRAL RESECTION OF BLADDER TUMOR N/A 06/06/2016   Procedure: TRANSURETHRAL RESECTION OF BLADDER TUMOR (TURBT);  Surgeon: Franchot Gallo, MD;  Location: [facility] ORS;  Service: Urology;  Laterality: N/A;  . TRANSURETHRAL RESECTION OF BLADDER TUMOR N/A 07/22/2016   Procedure: TRANSURETHRAL RESECTION OF BLADDER TUMOR (TURBT);  Surgeon: Franchot Gallo, MD;  Location: [facility] OR[facility];  Service: Urology;  Laterality: N/A;  . VIDEO ASSISTED THORACOSCOPY (VATS)/WEDGE RESECTION Right 03/30/2017   Procedure: VIDEO ASSISTED THORACOSCOPY (VATS)/WEDGE RESECTION;  Surgeon: Melrose Nakayama, MD;  Location[facility] OR;  Service: Thoracic;  Laterality: Right;    Social History   Socioeconomic History  . Marital status: Married    Spouse name: Asiel Chrostowski  . Number of children: 2  . Years of education: Not on file  . Highest education level: Not on file  Occupational History  . Occupation: Retired    Comment: Librarian, academic with [employer]  Social Needs  . Financial resource strain: Not very hard  . Food insecurity:    Worry: Never true    Inability: Never true  . Transportation needs:    Medical: No    Non-medical: No  Tobacco Use  . Smoking status: Former Smoker    Packs/day: 1.00    Years: 51.00    Pack years: 51.00    Types:  Cigarettes    Start date: 11/25/1954    Last attempt to quit: 10/27/2005    Years since quitting: 12.5  . Smokeless tobacco: Never Used  Substance and Sexual Activity  . Alcohol use: No    Alcohol/week: 0.0 standard drinks    Comment: none since 1999  . Drug use: No  . Sexual activity: Yes  Lifestyle  . Physical activity:    Days per week: 7 days    Minutes per session: 30 min  . Stress: Only a little  Relationships  . Social connections:    Talks on phone: More than three times a week    Gets together: More than three times a week    Attends religious service: More than 4 times per year    Active member of club or organization: Yes    Attends meetings of clubs or  organizations: More than 4 times per year    Relationship status: Married  . Intimate partner violence:    Fear of current or ex partner: No    Emotionally abused: No    Physically abused: No    Forced sexual activity: No  Other Topics Concern  . Not on file  Social History Narrative   2 sons that are deceased. 4 grandchildren and 3 great grandchildren. Lives at home with his wife.      Vitals:   05/24/18 1309  BP: 129/73  Pulse: (!) 50  SpO2: 98%  Weight: 176 lb (79.8 kg)  Height: 5\' 10"  (1.778 m)    Wt Readings from Last 3 Encounters:  05/24/18 176 lb (79.8 kg)  05/24/18 175 lb (79.4 kg)  03/17/18 173 lb 6.4 oz (78.7 kg)     PHYSICAL EXAM General: NAD HEENT: Normal. Neck: No JVD, no thyromegaly. Lungs: Clear to auscultation bilaterally with normal respiratory effort. CV: Regular rate and  irregular rhythm, normal S1/S2, no S3, no murmur. No pretibial or periankle edema.  No carotid bruit.   Abdomen: Soft, nontender, no distention.  Neurologic: Alert and oriented.  Psych: Normal affect. Skin: Normal. Musculoskeletal: No gross deformities.    ECG: Reviewed above under Subjective   Labs: Lab Results  Component Value Date/Time   K 4.4 03/15/2018 07:45 AM   K 4.2 05/07/2017 02:19 PM   BUN  15 03/15/2018 07:45 AM   BUN 17 02/16/2018 09:01 AM   BUN 14.5 05/07/2017 02:19 PM   CREATININE 1.19 03/15/2018 07:45 AM   CREATININE 1.0 05/07/2017 02:19 PM   ALT 19 03/15/2018 07:45 AM   ALT 14 05/07/2017 02:19 PM   TSH 3.370 10/27/2017 09:48 AM   HGB 13.6 03/15/2018 07:45 AM   HGB 13.1 05/07/2017 02:19 PM     Lipids: Lab Results  Component Value Date/Time   LDLCALC 55 02/16/2018 09:01 AM   LDLCALC 61 04/13/2014 08:46 AM   LDLCALC 52 01/14/2013 11:57 AM   CHOL 98 (L) 02/16/2018 09:01 AM   CHOL 118 01/14/2013 11:57 AM   TRIG 83 02/16/2018 09:01 AM   TRIG 160 (H) 04/13/2014 08:46 AM   TRIG 178 (H) 01/14/2013 11:57 AM   HDL 26 (L) 02/16/2018 09:01 AM   HDL 32 (L) 04/13/2014 08:46 AM   HDL 30 (L) 01/14/2013 11:57 AM       ASSESSMENT AND PLAN:  1. Severe aortic stenosis s/p TAVR in 02/2017:Symptomatically stable. Normal prosthetic valve function as noted above by most recent echocardiogram in October 2018.  Continue aspirin.  2. Chronic diastolic heart failure:Euvolemic and stable. No change to therapy.  3.  Permanent atrial fibrillation:Symptomatically stable on metoprolol. Currently not an anticoagulation candidate given history of GI bleeding.  Only on aspirin.  Hemoglobin normal at 13.6 on 03/15/2018.  4.Coronary artery disease: Symptomatically stable. Continue aspirin, Lipitor, and metoprolol.  5. Hyperlipidemia: Continue Lipitor.  Lipid panel ordered by PCP today is pending.  6. Chronic hypertension: Blood pressure is controlled. No changes totherapy.  7. Peripheral vascular disease: Followed by vascular surgery.Continue aspirin and statin.   Disposition: Follow up 6 months   Kate Sable, M.D., F.A.C.C.

## 2018-05-24 NOTE — Patient Instructions (Addendum)

## 2018-05-25 LAB — LIPID PANEL
Chol/HDL Ratio: 4 ratio (ref 0.0–5.0)
Cholesterol, Total: 109 mg/dL (ref 100–199)
HDL: 27 mg/dL — AB (ref >39)
LDL Calculated: 59 mg/dL (ref 0–99)
TRIGLYCERIDES: 116 mg/dL (ref 0–149)
VLDL Cholesterol Cal: 23 mg/dL (ref 5–40)

## 2018-05-25 LAB — CMP14+EGFR
ALT: 31 IU/L (ref 0–44)
AST: 34 IU/L (ref 0–40)
Albumin/Globulin Ratio: 1.3 (ref 1.2–2.2)
Albumin: 4.7 g/dL (ref 3.5–4.8)
Alkaline Phosphatase: 103 IU/L (ref 39–117)
BUN/Creatinine Ratio: 14 (ref 10–24)
BUN: 14 mg/dL (ref 8–27)
Bilirubin Total: 0.8 mg/dL (ref 0.0–1.2)
CALCIUM: 9.6 mg/dL (ref 8.6–10.2)
CO2: 23 mmol/L (ref 20–29)
Chloride: 103 mmol/L (ref 96–106)
Creatinine, Ser: 1 mg/dL (ref 0.76–1.27)
GFR calc Af Amer: 83 mL/min/{1.73_m2} (ref >59)
GFR calc non Af Amer: 72 mL/min/{1.73_m2} (ref >59)
GLUCOSE: 95 mg/dL (ref 65–99)
Globulin, Total: 3.5 g/dL (ref 1.5–4.5)
Potassium: 4.7 mmol/L (ref 3.5–5.2)
Sodium: 142 mmol/L (ref 134–144)
Total Protein: 8.2 g/dL (ref 6.0–8.5)

## 2018-05-25 LAB — THYROID PANEL WITH TSH
Free Thyroxine Index: 1.3 (ref 1.2–4.9)
T3 Uptake Ratio: 23 % — ABNORMAL LOW (ref 24–39)
T4, Total: 5.7 ug/dL (ref 4.5–12.0)
TSH: 3.91 u[IU]/mL (ref 0.450–4.500)

## 2018-05-25 LAB — URIC ACID: Uric Acid: 8 mg/dL (ref 3.7–8.6)

## 2018-06-08 ENCOUNTER — Encounter: Payer: Self-pay | Admitting: Nurse Practitioner

## 2018-06-10 ENCOUNTER — Ambulatory Visit (INDEPENDENT_AMBULATORY_CARE_PROVIDER_SITE_OTHER): Payer: Medicare Other | Admitting: *Deleted

## 2018-06-10 ENCOUNTER — Telehealth: Payer: Self-pay | Admitting: *Deleted

## 2018-06-10 VITALS — BP 121/73 | HR 54 | Temp 97.4°F | Ht 70.0 in | Wt 177.4 lb

## 2018-06-10 DIAGNOSIS — Z0000 Encounter for general adult medical examination without abnormal findings: Secondary | ICD-10-CM | POA: Diagnosis not present

## 2018-06-10 NOTE — Telephone Encounter (Signed)
Uric acid level was normal. What exactly did his brothe rgive him to take?

## 2018-06-10 NOTE — Telephone Encounter (Signed)
naprosyn

## 2018-06-10 NOTE — Patient Instructions (Signed)
  [name] [name] , Thank you for taking time to come for your Medicare Wellness Visit. I appreciate your ongoing commitment to your health goals. Please review the following plan we discussed and let me know if I can assist you in the future.   These are the goals we discussed: Goals    . Exercise 3x per week (30 min per time)       This is a list of the screening recommended for you and due dates:  Health Maintenance  Topic Date Due  . Tetanus Vaccine  10/28/2018*  . Flu Shot  Completed  . Pneumonia vaccines  Completed  *Topic was postponed. The date shown is not the original due date.

## 2018-06-10 NOTE — Telephone Encounter (Signed)
Patient states that at times especially after eating red meat that his left foot and left great toe swell and become really painful. Patient states that his brother gave him some of his gout medication and it helped and got better. He states uric acid was drawn here and wants to know if you will call in something for gout as needed.

## 2018-06-10 NOTE — Progress Notes (Addendum)
Subjective:   [name] [name] is a 77 y.o. male who presents for Medicare Annual/Subsequent preventive examination. Patient retired from [employer]. He enjoys playing golf, he loves listening to [employer] and is part of the [employer]. Patient states that he exercise daily. He states his diet is healthy. He lives with his wife and they have been married for 37 years. They had two sons but both passed away. He does not have any stairs in the home. He does have stairs going up to the porch.   Review of Systems:  He states his health is better that it was last year at this time.   Cardiac Risk Factors include: advanced age (>81men, >45 women);dyslipidemia;hypertension;male gender     Objective:    Vitals: BP 121/73   Pulse (!) 54   Temp (!) 97.4 F (36.3 C) (Oral)   Ht 5\' 10"  (1.778 m)   Wt 177 lb 6.4 oz (80.5 kg)   BMI 25.45 kg/m   Body mass index is 25.45 kg/m.  Advanced Directives 06/10/2018 05/27/2017 05/07/2017 03/30/2017 03/25/2017 02/20/2017 02/20/2017  Does Patient Have a Medical Advance Directive? Yes Yes Yes Yes Yes Yes Yes  Type of Advance Directive Living will [place];Living will Living will;Healthcare Power of [place];Living will Living will Living will -  Does patient want to make changes to medical advance directive? - No - Patient declined - No - Patient declined No - Patient declined No - Patient declined -  Copy of [place] in Chart? - No - copy requested No - copy requested No - copy requested - - -  Would patient like information on creating a medical advance directive? - - - - - - -    Tobacco Social History   Tobacco Use  Smoking Status Former Smoker  . Packs/day: 1.00  . Years: 51.00  . Pack years: 51.00  . Types: Cigarettes  . Start date: 11/25/1954  . Last attempt to quit: 10/27/2005  . Years since quitting: 12.6  Smokeless Tobacco Never Used     Counseling given: Not  Answered   Past Medical History:  Diagnosis Date  . AAA (abdominal aortic aneurysm) ([place])   . Aortic stenosis, severe    a. 02/2017: s/p TAVR with Edwards Sapien 3 THV (size 26 mm, model # 9600TFX, serial # [id number])  . Arthritis   . Atherosclerotic peripheral vascular disease ([place])   . Atrial fibrillation, persistent    a. not a candidate for [place] given hx of GI bleed  . Bilateral carotid artery stenosis   . Bladder cancer ([place])   . COPD (chronic obstructive pulmonary disease) ([place])   . Hyperlipidemia   . Hypertension   . Hypothyroidism   . Lung cancer ([place])    Right  . Lung mass    Right  . Skin cancer   . Vitamin D deficiency    Past Surgical History:  Procedure Laterality Date  . ABDOMINAL AORTIC ANEURYSM REPAIR  08-09-09  . BACK SURGERY    . ESOPHAGOGASTRODUODENOSCOPY N/A 02/21/2017   Procedure: ESOPHAGOGASTRODUODENOSCOPY (EGD);  Surgeon: Jerene Bears, MD;  [facility] ENDOSCOPY;  Service: Gastroenterology;  Laterality: N/A;  . HYDROCELE EXCISION / REPAIR    . LOBECTOMY Right 03/30/2017   Procedure: RIGHT UPPER LOBECTOMY LUNG;  Surgeon: Melrose Nakayama, MD;  Location: [place];  Service: Thoracic;  Laterality: Right;  . SKIN CANCER EXCISION    . SPINE SURGERY    .  TEE WITHOUT CARDIOVERSION N/A 02/24/2017   Procedure: TRANSESOPHAGEAL ECHOCARDIOGRAM (TEE);  Surgeon: Sherren Mocha, MD;  Location: [place];  Service: Open Heart Surgery;  Laterality: N/A;  . TRANSCATHETER AORTIC VALVE REPLACEMENT, TRANSFEMORAL N/A 02/24/2017   Procedure: TRANSCATHETER AORTIC VALVE REPLACEMENT, TRANSFEMORAL using a 18mm Edwards Sapien 3 Aortic Valve;  Surgeon: Sherren Mocha, MD;  Location: [place];  Service: Open Heart Surgery;  Laterality: N/A;  . TRANSURETHRAL RESECTION OF BLADDER TUMOR N/A 06/06/2016   Procedure: TRANSURETHRAL RESECTION OF BLADDER TUMOR (TURBT);  Surgeon: Franchot Gallo, MD;  [facility];  Service: Urology;  Laterality: N/A;  . TRANSURETHRAL RESECTION OF BLADDER TUMOR N/A  07/22/2016   Procedure: TRANSURETHRAL RESECTION OF BLADDER TUMOR (TURBT);  Surgeon: Franchot Gallo, MD;  [facility];  Service: Urology;  Laterality: N/A;  . VIDEO ASSISTED THORACOSCOPY (VATS)/WEDGE RESECTION Right 03/30/2017   Procedure: VIDEO ASSISTED THORACOSCOPY (VATS)/WEDGE RESECTION;  Surgeon: Melrose Nakayama, MD;  [facility];  Service: Thoracic;  Laterality: Right;   Family History  Problem Relation Age of Onset  . Heart disease Father        Heart Disease before age 12  . Alcohol abuse Father   . Heart attack Father   . Cancer Mother        Lung  . COPD Mother   . COPD Sister   . Lupus Sister   . Hypertension Brother   . Gout Brother   . Heart attack Son   . Gout Brother   . Prostatitis Brother    Social History   Socioeconomic History  . Marital status: Married    Spouse name: Alexus Michael  . Number of children: 2  . Years of education: Not on file  . Highest education level: Not on file  Occupational History  . Occupation: Retired    Comment: Librarian, academic with [employer]  Social Needs  . Financial resource strain: Not very hard  . Food insecurity:    Worry: Never true    Inability: Never true  . Transportation needs:    Medical: No    Non-medical: No  Tobacco Use  . Smoking status: Former Smoker    Packs/day: 1.00    Years: 51.00    Pack years: 51.00    Types: Cigarettes    Start date: 11/25/1954    Last attempt to quit: 10/27/2005    Years since quitting: 12.6  . Smokeless tobacco: Never Used  Substance and Sexual Activity  . Alcohol use: No    Alcohol/week: 0.0 standard drinks    Comment: none since 1999  . Drug use: No  . Sexual activity: Yes  Lifestyle  . Physical activity:    Days per week: 7 days    Minutes per session: 30 min  . Stress: Only a little  Relationships  . Social connections:    Talks on phone: More than three times a week    Gets together: More than three times a week    Attends religious service: More  than 4 times per year    Active member of club or organization: Yes    Attends meetings of clubs or organizations: More than 4 times per year    Relationship status: Married  Other Topics Concern  . Not on file  Social History Narrative   2 sons that are deceased. 4 grandchildren and 3 great grandchildren. Lives at home with his wife.     Outpatient Encounter Medications as of 06/10/2018  Medication Sig  . alfuzosin (  UROXATRAL) 10 MG 24 hr tablet Take 10 mg by mouth at bedtime.   Marland Kitchen amLODipine (NORVASC) 5 MG tablet Take 1 tablet (5 mg total) by mouth daily.  Marland Kitchen aspirin EC 81 MG tablet Take 1 tablet (81 mg total) by mouth daily.  Marland Kitchen atorvastatin (LIPITOR) 40 MG tablet Take 1 tablet (40 mg total) by mouth daily.  . cholecalciferol (VITAMIN D) 1000 UNITS tablet Take 1 tablet (1,000 Units total) by mouth daily.  . ferrous gluconate (FERGON) 324 MG tablet TAKE (1) TABLET TWICE A DAY WITH MEALS (BREAKFAST AND SUPPER)  . levothyroxine (SYNTHROID, LEVOTHROID) 75 MCG tablet Take 1 tablet (75 mcg total) by mouth daily.  Marland Kitchen lisinopril (PRINIVIL,ZESTRIL) 40 MG tablet Take 1 tablet (40 mg total) by mouth daily.  . metoprolol tartrate (LOPRESSOR) 25 MG tablet Take 0.5 tablets (12.5 mg total) by mouth 2 (two) times daily.  . Multiple Vitamin (ONE-A-DAY MENS PO) Take 1 tablet by mouth daily.    . Omega-3 Fatty Acids (FISH OIL) 1200 MG CAPS Take 2,400 mg by mouth daily.   . pantoprazole (PROTONIX) 40 MG tablet Take 1 tablet (40 mg total) by mouth 2 (two) times daily.   No facility-administered encounter medications on file as of 06/10/2018.     Activities of Daily Living In your present state of health, do you have any difficulty performing the following activities: 06/10/2018  Hearing? N  Vision? Y  Comment Patient states he is due for eye exam and will schedule appointment  Difficulty concentrating or making decisions? N  Walking or climbing stairs? N  Dressing or bathing? N  Doing errands,  shopping? N  Preparing Food and eating ? N  Using the Toilet? N  In the past six months, have you accidently leaked urine? N  Do you have problems with loss of bowel control? N  Managing your Medications? N  Managing your Finances? N  Housekeeping or managing your Housekeeping? N  Some recent data might be hidden    Patient Care Team: [name], FNP as PCP - General (Nurse Practitioner) Herminio Commons, MD as PCP - Cardiology (Cardiology) [name], FNP as Nurse Practitioner (Nurse Practitioner) Allyn Kenner, MD (Dermatology) Harlen Labs, MD as Referring Physician (Optometry) Suella Broad, MD as Consulting Physician (Orthopedic Surgery) Serafina Mitchell, MD as Consulting Physician (Vascular Surgery) Herminio Commons, MD as Attending Physician (Cardiology) McKenzie, Candee Furbish, MD as Consulting Physician (Urology) Sherren Mocha, MD as Consulting Physician (Cardiology) Franchot Gallo, MD as Consulting Physician (Urology) Melrose Nakayama, MD as Consulting Physician (Cardiothoracic Surgery)   Assessment:   This is a routine wellness examination for [place].  Exercise Activities and Dietary recommendations Current Exercise Habits: Home exercise routine, Type of exercise: walking, Time (Minutes): 30, Frequency (Times/Week): 7, Weekly Exercise (Minutes/Week): 210, Intensity: Mild  Goals    . Exercise 3x per week (30 min per time)       Fall Risk Fall Risk  06/10/2018 05/24/2018 02/16/2018 10/27/2017 07/28/2017  Falls in the past year? 0 0 No No No   Is the patient's home free of loose throw rugs in walkways, pet beds, electrical cords, etc?   yes      Grab bars in the bathroom? yes      Handrails on the stairs?   yes      Adequate lighting?   yes  Timed Get Up and Go Performed:   Depression Screen PHQ 2/9 Scores 06/10/2018 05/24/2018 02/16/2018 10/27/2017  PHQ - 2 Score  2 2 0 0  PHQ- 9 Score 2 2 - -    Cognitive Function MMSE -  Mini Mental State Exam 06/10/2018 05/27/2017 05/26/2016 12/05/2014  Orientation to time 5 5 5 5   Orientation to Place 5 5 5 5   Registration 3 3 3 3   Attention/ Calculation 5 5 5 5   Recall 3 2 3 2   Language- name 2 objects 2 2 2 2   Language- repeat 1 1 1 1   Language- follow 3 step command 3 3 3 3   Language- read & follow direction 1 1 1 1   Write a sentence 1 1 1 1   Copy design 1 1 1 1   Total score 30 29 30 29         Immunization History  Administered Date(s) Administered  . Influenza Inj Mdck Quad With Preservative 03/18/2018  . Influenza Split 03/23/2013  . Influenza, High Dose Seasonal PF 03/06/2014  . Influenza,inj,Quad PF,6+ Mos 03/22/2015, 03/18/2017  . Influenza-Unspecified 03/22/2016, 03/18/2018  . Pneumococcal Conjugate-13 04/26/2013  . Pneumococcal Polysaccharide-23 06/23/2004, 12/11/2014  . Td 03/24/2007    Qualifies for Shingles Vaccine?   Screening Tests Health Maintenance  Topic Date Due  . TETANUS/TDAP  10/28/2018 (Originally 03/23/2017)  . INFLUENZA VACCINE  Completed  . PNA vac Low Risk Adult  Completed   Cancer Screenings: Lung: Low Dose CT Chest recommended if Age 12-80 years, 30 pack-year currently smoking OR have quit w/in 15years. Patient is being watched by oncologist.   Colorectal:   Additional Screenings:  Hepatitis C Screening:      Plan:    Keep follow up with PCP Tdap price checked today and would have been $45. Patient states we can check price again at next visit.  Patient to call and schedule eye exam   I have personally reviewed and noted the following in the patient's chart:   . Medical and social history . Use of alcohol, tobacco or illicit drugs  . Current medications and supplements . Functional ability and status . Nutritional status . Physical activity . Advanced directives . List of other physicians . Hospitalizations, surgeries, and ER visits in previous 12 months . Vitals . Screenings to include cognitive, depression,  and falls . Referrals and appointments  In addition, I have reviewed and discussed with patient certain preventive protocols, quality metrics, and best practice recommendations. A written personalized care plan for preventive services as well as general preventive health recommendations were provided to patient.     [name] Morgan, LPN  55/20/8022  I have reviewed and agree with the above AWV documentation.   [name] Hassell Done, FNP

## 2018-06-11 NOTE — Telephone Encounter (Signed)
Naprosyn is not used for gout pain. It is an antiinflammatory and patient need sto be careful taking because can effect kidney function.

## 2018-06-12 NOTE — Telephone Encounter (Signed)
Patient would like to know if you will send in some Naprosyn to use just as needed.

## 2018-06-14 MED ORDER — NAPROXEN 500 MG PO TABS: 500.0000 mg | ORAL_TABLET | Freq: Two times a day (BID) | ORAL | 1 refills | Status: DC

## 2018-06-14 NOTE — Telephone Encounter (Signed)
Pt aware.

## 2018-06-14 NOTE — Telephone Encounter (Signed)
Naprosyn was sent to [facility]- only take if needed.

## 2018-08-06 ENCOUNTER — Other Ambulatory Visit: Payer: Self-pay

## 2018-08-06 DIAGNOSIS — Z95828 Presence of other vascular implants and grafts: Secondary | ICD-10-CM

## 2018-08-06 DIAGNOSIS — I714 Abdominal aortic aneurysm, without rupture, unspecified: Secondary | ICD-10-CM

## 2018-08-08 NOTE — Progress Notes (Signed)
HISTORY AND PHYSICAL     CC:  Follow up Requesting Provider:  [name], *  HPI: This is a 78 y.o. male who underwent EVAR in February 2011 by Dr. Trula Slade.  He has a hx of mild carotid artery stenosis and scheduled to have carotid duplex next year.  At his last visit, his aneurysmal sac had decreased in size to 3.3 from 3.9 the previous year.  He did not have any new back or abdominal pain.  He does have a hx of lumbar spine surgery.  He did not have claudication.  He underwent TAVR in September 2018 by Dr. Cyndia Bent.  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    He denies lumbar or abdominal pain.  He denies claudication symptoms and can walk as far as he wants holding to a cart.  No change in his medical history since his last visit.  He takes a daily aspirin, Lipitor and Norvasc for HTN.      Past Medical History:  Diagnosis Date  . AAA (abdominal aortic aneurysm) ([place])   . Aortic stenosis, severe    a. 02/2017: s/p TAVR with Edwards Sapien 3 THV (size 26 mm, model # 9600TFX, serial # [id number])  . Arthritis   . Atherosclerotic peripheral vascular disease ([place])   . Atrial fibrillation, persistent    a. not a candidate for [place] given hx of GI bleed  . Bilateral carotid artery stenosis   . Bladder cancer ([place])   . COPD (chronic obstructive pulmonary disease) ([place])   . Hyperlipidemia   . Hypertension   . Hypothyroidism   . Lung cancer ([place])    Right  . Lung mass    Right  . Skin cancer   . Vitamin D deficiency     Past Surgical History:  Procedure Laterality Date  . ABDOMINAL AORTIC ANEURYSM REPAIR  08-09-09  . BACK SURGERY    . ESOPHAGOGASTRODUODENOSCOPY N/A 02/21/2017   Procedure: ESOPHAGOGASTRODUODENOSCOPY (EGD);  Surgeon: Jerene Bears, MD;  Location: [facility] ENDOSCOPY;  Service: Gastroenterology;  Laterality: N/A;  . HYDROCELE EXCISION / REPAIR    . LOBECTOMY Right 03/30/2017   Procedure: RIGHT UPPER LOBECTOMY LUNG;  Surgeon: Melrose Nakayama, MD;   Location: [place];  Service: Thoracic;  Laterality: Right;  . SKIN CANCER EXCISION    . SPINE SURGERY    . TEE WITHOUT CARDIOVERSION N/A 02/24/2017   Procedure: TRANSESOPHAGEAL ECHOCARDIOGRAM (TEE);  Surgeon: Sherren Mocha, MD;  Location: [place];  Service: Open Heart Surgery;  Laterality: N/A;  . TRANSCATHETER AORTIC VALVE REPLACEMENT, TRANSFEMORAL N/A 02/24/2017   Procedure: TRANSCATHETER AORTIC VALVE REPLACEMENT, TRANSFEMORAL using a 37mm Edwards Sapien 3 Aortic Valve;  Surgeon: Sherren Mocha, MD;  Location: [place];  Service: Open Heart Surgery;  Laterality: N/A;  . TRANSURETHRAL RESECTION OF BLADDER TUMOR N/A 06/06/2016   Procedure: TRANSURETHRAL RESECTION OF BLADDER TUMOR (TURBT);  Surgeon: Franchot Gallo, MD;  [facility];  Service: Urology;  Laterality: N/A;  . TRANSURETHRAL RESECTION OF BLADDER TUMOR N/A 07/22/2016   Procedure: TRANSURETHRAL RESECTION OF BLADDER TUMOR (TURBT);  Surgeon: Franchot Gallo, MD;  [facility];  Service: Urology;  Laterality: N/A;  . VIDEO ASSISTED THORACOSCOPY (VATS)/WEDGE RESECTION Right 03/30/2017   Procedure: VIDEO ASSISTED THORACOSCOPY (VATS)/WEDGE RESECTION;  Surgeon: Melrose Nakayama, MD;  Location: [place] [facility];  Service: Thoracic;  Laterality: Right;    Allergies  Allergen Reactions  . Eliquis [Apixaban] Other (See Comments)    Eliquis and lovenox caused his "  ulcer to bleed."  . Lovenox [Enoxaparin]     HIT panel ordered 10/10    Current Outpatient Medications  Medication Sig Dispense Refill  . alfuzosin (UROXATRAL) 10 MG 24 hr tablet Take 10 mg by mouth at bedtime.     Marland Kitchen amLODipine (NORVASC) 5 MG tablet Take 1 tablet (5 mg total) by mouth daily. 30 tablet 4  . aspirin EC 81 MG tablet Take 1 tablet (81 mg total) by mouth daily. 90 tablet 3  . atorvastatin (LIPITOR) 40 MG tablet Take 1 tablet (40 mg total) by mouth daily. 30 tablet 4  . cholecalciferol (VITAMIN D) 1000 UNITS tablet Take 1 tablet (1,000 Units total) by mouth daily. 30  tablet 6  . ferrous gluconate (FERGON) 324 MG tablet TAKE (1) TABLET TWICE A DAY WITH MEALS (BREAKFAST AND SUPPER) 60 tablet 3  . levothyroxine (SYNTHROID, LEVOTHROID) 75 MCG tablet Take 1 tablet (75 mcg total) by mouth daily. 30 tablet 8  . lisinopril (PRINIVIL,ZESTRIL) 40 MG tablet Take 1 tablet (40 mg total) by mouth daily. 30 tablet 4  . metoprolol tartrate (LOPRESSOR) 25 MG tablet Take 0.5 tablets (12.5 mg total) by mouth 2 (two) times daily. 30 tablet 6  . Multiple Vitamin (ONE-A-DAY MENS PO) Take 1 tablet by mouth daily.      . naproxen (NAPROSYN) 500 MG tablet Take 1 tablet (500 mg total) by mouth 2 (two) times daily with a meal. 30 tablet 1  . Omega-3 Fatty Acids (FISH OIL) 1200 MG CAPS Take 2,400 mg by mouth daily.     . pantoprazole (PROTONIX) 40 MG tablet Take 1 tablet (40 mg total) by mouth 2 (two) times daily. 60 tablet 2   No current facility-administered medications for this visit.     Family History  Problem Relation Age of Onset  . Heart disease Father        Heart Disease before age 82  . Alcohol abuse Father   . Heart attack Father   . Cancer Mother        Lung  . COPD Mother   . COPD Sister   . Lupus Sister   . Hypertension Brother   . Gout Brother   . Heart attack Son   . Gout Brother   . Prostatitis Brother     Social History   Socioeconomic History  . Marital status: Married    Spouse name: Akon Reinoso  . Number of children: 2  . Years of education: Not on file  . Highest education level: Not on file  Occupational History  . Occupation: Retired    Comment: Librarian, academic with [employer]  Social Needs  . Financial resource strain: Not very hard  . Food insecurity:    Worry: Never true    Inability: Never true  . Transportation needs:    Medical: No    Non-medical: No  Tobacco Use  . Smoking status: Former Smoker    Packs/day: 1.00    Years: 51.00    Pack years: 51.00    Types: Cigarettes    Start date: 11/25/1954    Last attempt to  quit: 10/27/2005    Years since quitting: 12.7  . Smokeless tobacco: Never Used  Substance and Sexual Activity  . Alcohol use: No    Alcohol/week: 0.0 standard drinks    Comment: none since 1999  . Drug use: No  . Sexual activity: Yes  Lifestyle  . Physical activity:    Days per week: 7 days  Minutes per session: 30 min  . Stress: Only a little  Relationships  . Social connections:    Talks on phone: More than three times a week    Gets together: More than three times a week    Attends religious service: More than 4 times per year    Active member of club or organization: Yes    Attends meetings of clubs or organizations: More than 4 times per year    Relationship status: Married  . Intimate partner violence:    Fear of current or ex partner: No    Emotionally abused: No    Physically abused: No    Forced sexual activity: No  Other Topics Concern  . Not on file  Social History Narrative   2 sons that are deceased. 4 grandchildren and 3 great grandchildren. Lives at home with his wife.      REVIEW OF SYSTEMS:   [X]  denotes positive finding, [ ]  denotes negative finding Cardiac  Comments:  Chest pain or chest pressure:    Shortness of breath upon exertion:    Short of breath when lying flat:    Irregular heart rhythm:        Vascular    Pain in calf, thigh, or hip brought on by ambulation:    Pain in feet at night that wakes you up from your sleep:     Blood clot in your veins:    Leg swelling:         Pulmonary    Oxygen at home:    Productive cough:     Wheezing:         Neurologic    Sudden weakness in arms or legs:     Sudden numbness in arms or legs:     Sudden onset of difficulty speaking or slurred speech:    Temporary loss of vision in one eye:     Problems with dizziness:         Gastrointestinal    Blood in stool:     Vomited blood:         Genitourinary    Burning when urinating:     Blood in urine:        Psychiatric    Major depression:          Hematologic    Bleeding problems:    Problems with blood clotting too easily:        Skin    Rashes or ulcers:        Constitutional    Fever or chills:      PHYSICAL EXAMINATION:    General:  WDWN in NAD; vital signs documented above Gait: Not observed HENT: WNL, normocephalic Pulmonary: normal non-labored breathing , without Rales, rhonchi,  wheezing Cardiac: regular HR, without  Murmurs without carotid bruit Abdomen: soft, NT, no masses Skin: without rashes Vascular Exam/Pulses:  Right Left  Radial 2+ (normal) 2+ (normal)  Ulnar    Femoral 2+ (normal) 2+ (normal)  Popliteal    DP 2+ (normal) 2+ (normal)  PT 2+ (normal) 2+ (normal)   Extremities: without ischemic changes, without Gangrene , without cellulitis; without open wounds;  Musculoskeletal: no muscle wasting or atrophy  Neurologic: A&O X 3;  No focal weakness or paresthesias are detected Psychiatric:  The pt has Normal affect.   Non-Invasive Vascular Imaging:      Endovascular Aortic Repair (EVAR): +----------+----------------+-------------------+-------------------+           Diameter AP (cm)Diameter Trans (cm)Velocities (  cm/sec) +----------+----------------+-------------------+-------------------+ Aorta     3.80            3.90               60                  +----------+----------------+-------------------+-------------------+ Right Limb1.39            1.34               53                  +----------+----------------+-------------------+-------------------+ Left Limb 1.33            1.33               47                  +----------+----------------+-------------------+-------------------+      Summary: Abdominal Aorta: Patent endovascular aneurysm repair with no evidence of endoleak.      ASSESSMENT/PLAN:: 78 y.o. male here for follow up for EVAR in February 2011 by Dr. Trula Slade and has a hx of mild carotid artery stenosis < 40% stenosis 2018 asymptomatic  and no bruit to auscultation.   He will F/U in 1 year for repeat EVAR duplex and repeat carotid duplex. If he problems or concerns with lumbar or abdominal pain he will call sooner.     Roxy Horseman, PA-C [facility] [phone]  Clinic MD:   Early

## 2018-08-10 ENCOUNTER — Ambulatory Visit (HOSPITAL_COMMUNITY)
Admission: RE | Admit: 2018-08-10 | Discharge: 2018-08-10 | Disposition: A | Discharge status: Home / Self Care | Payer: Medicare Other | Source: Ambulatory Visit | Attending: Vascular Surgery | Admitting: Vascular Surgery

## 2018-08-10 ENCOUNTER — Encounter: Payer: Self-pay | Admitting: Family

## 2018-08-10 ENCOUNTER — Ambulatory Visit (INDEPENDENT_AMBULATORY_CARE_PROVIDER_SITE_OTHER): Payer: Medicare Other | Admitting: Physician Assistant

## 2018-08-10 VITALS — BP 156/89 | HR 68 | Temp 97.4°F | Resp 14 | Ht 70.0 in | Wt 177.0 lb

## 2018-08-10 DIAGNOSIS — I714 Abdominal aortic aneurysm, without rupture, unspecified: Secondary | ICD-10-CM

## 2018-08-10 DIAGNOSIS — Z95828 Presence of other vascular implants and grafts: Secondary | ICD-10-CM | POA: Diagnosis not present

## 2018-08-17 ENCOUNTER — Other Ambulatory Visit (HOSPITAL_COMMUNITY)
Admission: RE | Admit: 2018-08-17 | Discharge: 2018-08-17 | Disposition: A | Discharge status: Home / Self Care | Payer: Medicare Other | Source: Ambulatory Visit | Attending: Urology | Admitting: Urology

## 2018-08-17 ENCOUNTER — Ambulatory Visit: Payer: Medicare Other | Admitting: Urology

## 2018-08-17 DIAGNOSIS — C678 Malignant neoplasm of overlapping sites of bladder: Secondary | ICD-10-CM | POA: Insufficient documentation

## 2018-08-30 ENCOUNTER — Other Ambulatory Visit: Payer: Self-pay

## 2018-08-30 ENCOUNTER — Encounter (HOSPITAL_BASED_OUTPATIENT_CLINIC_OR_DEPARTMENT_OTHER): Payer: Self-pay | Admitting: *Deleted

## 2018-08-30 ENCOUNTER — Other Ambulatory Visit: Payer: Self-pay | Admitting: Urology

## 2018-08-30 NOTE — Progress Notes (Signed)
Spoke to patient via telephone for pre op interview. No solid after MN but can have clear liquids until 0630. Patient verbalized understanding of clear liquid diet and no milk products. Will need BMET AM day of surgery. Current EKG on chart and in Epic. Arrival time 1030.

## 2018-09-01 NOTE — H&P (Signed)
H&P  Chief Complaint: Bladder cancer  History of Present Illness: A 78 year old male presents at this time for cystoscopy and bladder biopsy.  He has a history of high-grade carcinoma in situ.  He has completed induction and maintenance BCG.  Recent cystoscopy revealed fairly normal findings but cytology was significant for high-grade carcinoma.  He presents for cystoscopy, bladder biopsy and bilateral retrograde ureteral pyelogram.    Past Medical History:  Diagnosis Date  . AAA (abdominal aortic aneurysm) ([place])   . Aortic stenosis, severe    a. 02/2017: s/p TAVR with Edwards Sapien 3 THV (size 26 mm, model # 9600TFX, serial # [id number])  . Arthritis   . Atherosclerotic peripheral vascular disease ([place])   . Atrial fibrillation, persistent    a. not a candidate for [place] given hx of GI bleed  . Bilateral carotid artery stenosis   . Bladder cancer ([place])   . COPD (chronic obstructive pulmonary disease) ([place])   . Hyperlipidemia   . Hypertension   . Hypothyroidism   . Lung cancer ([place])    Right  . Lung mass    Right  . Skin cancer   . Vitamin D deficiency     Past Surgical History:  Procedure Laterality Date  . ABDOMINAL AORTIC ANEURYSM REPAIR  08-09-09  . BACK SURGERY    . ESOPHAGOGASTRODUODENOSCOPY N/A 02/21/2017   Procedure: ESOPHAGOGASTRODUODENOSCOPY (EGD);  Surgeon: Jerene Bears, MD;  Location: [facility] ENDOSCOPY;  Service: Gastroenterology;  Laterality: N/A;  . HYDROCELE EXCISION / REPAIR    . LOBECTOMY Right 03/30/2017   Procedure: RIGHT UPPER LOBECTOMY LUNG;  Surgeon: Melrose Nakayama, MD;  Location: [place];  Service: Thoracic;  Laterality: Right;  . SKIN CANCER EXCISION    . SPINE SURGERY    . TEE WITHOUT CARDIOVERSION N/A 02/24/2017   Procedure: TRANSESOPHAGEAL ECHOCARDIOGRAM (TEE);  Surgeon: Sherren Mocha, MD;  Location: [facility];  Service: Open Heart Surgery;  Laterality: N/A;  . TRANSCATHETER AORTIC VALVE REPLACEMENT, TRANSFEMORAL N/A 02/24/2017   Procedure: TRANSCATHETER  AORTIC VALVE REPLACEMENT, TRANSFEMORAL using a 90mm Edwards Sapien 3 Aortic Valve;  Surgeon: Sherren Mocha, MD;  Location: [facility];  Service: Open Heart Surgery;  Laterality: N/A;  . TRANSURETHRAL RESECTION OF BLADDER TUMOR N/A 06/06/2016   Procedure: TRANSURETHRAL RESECTION OF BLADDER TUMOR (TURBT);  Surgeon: Franchot Gallo, MD;  Location: [facility] OR[facility];  Service: Urology;  Laterality: N/A;  . TRANSURETHRAL RESECTION OF BLADDER TUMOR N/A 07/22/2016   Procedure: TRANSURETHRAL RESECTION OF BLADDER TUMOR (TURBT);  Surgeon: Franchot Gallo, MD;  Location: [facility];  Service: Urology;  Laterality: N/A;  . VIDEO ASSISTED THORACOSCOPY (VATS)/WEDGE RESECTION Right 03/30/2017   Procedure: VIDEO ASSISTED THORACOSCOPY (VATS)/WEDGE RESECTION;  Surgeon: Melrose Nakayama, MD;  Location: [facility];  Service: Thoracic;  Laterality: Right;    Home Medications:  Allergies as of 09/01/2018      Reactions   Eliquis [apixaban] Other (See Comments)   Eliquis and lovenox caused his " ulcer to bleed."   Lovenox [enoxaparin]    HIT panel ordered 10/10      Medication List    Notice   Cannot display discharge medications because the patient has not yet been admitted.     Allergies:  Allergies  Allergen Reactions  . Eliquis [Apixaban] Other (See Comments)    Eliquis and lovenox caused his " ulcer to bleed."  . Lovenox [Enoxaparin]     HIT panel ordered 10/10    Family History  Problem Relation Age of Onset  . Heart disease  Father        Heart Disease before age 19  . Alcohol abuse Father   . Heart attack Father   . Cancer Mother        Lung  . COPD Mother   . COPD Sister   . Lupus Sister   . Hypertension Brother   . Gout Brother   . Heart attack Son   . Gout Brother   . Prostatitis Brother     Social History:  reports that he quit smoking about 12 years ago. His smoking use included cigarettes. He started smoking about 63 years ago. He has a 51.00 pack-year smoking history. He has never used  smokeless tobacco. He reports that he does not drink alcohol or use drugs.  ROS: A complete review of systems was performed.  All systems are negative except for pertinent findings as noted.  Physical Exam:  Vital signs in last 24 hours:   Constitutional:  Alert and oriented, No acute distress Cardiovascular: Regular rate  Respiratory: Normal respiratory effort GI: Abdomen is soft, nontender, nondistended, no abdominal masses. No CVAT.  Genitourinary: Normal male phallus, testes are descended bilaterally and non-tender and without masses, scrotum is normal in appearance without lesions or masses, perineum is normal on inspection. Lymphatic: No lymphadenopathy Neurologic: Grossly intact, no focal deficits Psychiatric: Normal mood and affect  Laboratory Data:  No results for input(s): WBC, HGB, HCT, PLT in the last 72 hours.  No results for input(s): NA, K, CL, GLUCOSE, BUN, CALCIUM, CREATININE in the last 72 hours.  Invalid input(s): CO3   No results found for this or any previous visit (from the past 24 hour(s)). No results found for this or any previous visit (from the past 240 hour(s)).  Renal Function: No results for input(s): CREATININE in the last 168 hours. CrCl cannot be calculated (Patient's most recent lab result is older than the maximum 21 days allowed.).  Radiologic Imaging: No results found.  Impression/Assessment:    The carcinoma in situ of bladder, status post resection, induction and maintenance BCG, with probable recurrence  Plan:   cystoscopy, bilateral retrograde ureteral pyelogram, bladder biopsy

## 2018-09-02 ENCOUNTER — Ambulatory Visit (HOSPITAL_BASED_OUTPATIENT_CLINIC_OR_DEPARTMENT_OTHER)
Admission: RE | Admit: 2018-09-02 | Discharge: 2018-09-02 | Disposition: A | Discharge status: Home / Self Care | Payer: Medicare Other | Attending: Urology | Admitting: Urology

## 2018-09-02 ENCOUNTER — Ambulatory Visit (HOSPITAL_BASED_OUTPATIENT_CLINIC_OR_DEPARTMENT_OTHER): Payer: Medicare Other | Admitting: Anesthesiology

## 2018-09-02 ENCOUNTER — Other Ambulatory Visit: Payer: Self-pay

## 2018-09-02 ENCOUNTER — Encounter (HOSPITAL_BASED_OUTPATIENT_CLINIC_OR_DEPARTMENT_OTHER)
Admission: RE | Disposition: A | Discharge status: Home / Self Care | Payer: Self-pay | Source: Home / Self Care | Attending: Urology

## 2018-09-02 ENCOUNTER — Encounter (HOSPITAL_BASED_OUTPATIENT_CLINIC_OR_DEPARTMENT_OTHER): Payer: Self-pay

## 2018-09-02 DIAGNOSIS — Z8249 Family history of ischemic heart disease and other diseases of the circulatory system: Secondary | ICD-10-CM | POA: Diagnosis not present

## 2018-09-02 DIAGNOSIS — E039 Hypothyroidism, unspecified: Secondary | ICD-10-CM | POA: Insufficient documentation

## 2018-09-02 DIAGNOSIS — I2510 Atherosclerotic heart disease of native coronary artery without angina pectoris: Secondary | ICD-10-CM | POA: Diagnosis not present

## 2018-09-02 DIAGNOSIS — Z952 Presence of prosthetic heart valve: Secondary | ICD-10-CM | POA: Diagnosis not present

## 2018-09-02 DIAGNOSIS — Z8349 Family history of other endocrine, nutritional and metabolic diseases: Secondary | ICD-10-CM | POA: Insufficient documentation

## 2018-09-02 DIAGNOSIS — Z801 Family history of malignant neoplasm of trachea, bronchus and lung: Secondary | ICD-10-CM | POA: Diagnosis not present

## 2018-09-02 DIAGNOSIS — Z811 Family history of alcohol abuse and dependence: Secondary | ICD-10-CM | POA: Insufficient documentation

## 2018-09-02 DIAGNOSIS — I6523 Occlusion and stenosis of bilateral carotid arteries: Secondary | ICD-10-CM | POA: Insufficient documentation

## 2018-09-02 DIAGNOSIS — C679 Malignant neoplasm of bladder, unspecified: Secondary | ICD-10-CM | POA: Insufficient documentation

## 2018-09-02 DIAGNOSIS — Z888 Allergy status to other drugs, medicaments and biological substances status: Secondary | ICD-10-CM | POA: Diagnosis not present

## 2018-09-02 DIAGNOSIS — I350 Nonrheumatic aortic (valve) stenosis: Secondary | ICD-10-CM | POA: Insufficient documentation

## 2018-09-02 DIAGNOSIS — M1990 Unspecified osteoarthritis, unspecified site: Secondary | ICD-10-CM | POA: Insufficient documentation

## 2018-09-02 DIAGNOSIS — E785 Hyperlipidemia, unspecified: Secondary | ICD-10-CM | POA: Insufficient documentation

## 2018-09-02 DIAGNOSIS — Z79899 Other long term (current) drug therapy: Secondary | ICD-10-CM | POA: Diagnosis not present

## 2018-09-02 DIAGNOSIS — R8279 Other abnormal findings on microbiological examination of urine: Secondary | ICD-10-CM | POA: Diagnosis not present

## 2018-09-02 DIAGNOSIS — Z8551 Personal history of malignant neoplasm of bladder: Secondary | ICD-10-CM | POA: Insufficient documentation

## 2018-09-02 DIAGNOSIS — I10 Essential (primary) hypertension: Secondary | ICD-10-CM | POA: Insufficient documentation

## 2018-09-02 DIAGNOSIS — I4819 Other persistent atrial fibrillation: Secondary | ICD-10-CM | POA: Insufficient documentation

## 2018-09-02 DIAGNOSIS — Z87891 Personal history of nicotine dependence: Secondary | ICD-10-CM | POA: Insufficient documentation

## 2018-09-02 DIAGNOSIS — Z825 Family history of asthma and other chronic lower respiratory diseases: Secondary | ICD-10-CM | POA: Diagnosis not present

## 2018-09-02 DIAGNOSIS — R896 Abnormal cytological findings in specimens from other organs, systems and tissues: Secondary | ICD-10-CM | POA: Diagnosis not present

## 2018-09-02 DIAGNOSIS — Z85828 Personal history of other malignant neoplasm of skin: Secondary | ICD-10-CM | POA: Diagnosis not present

## 2018-09-02 DIAGNOSIS — I739 Peripheral vascular disease, unspecified: Secondary | ICD-10-CM | POA: Insufficient documentation

## 2018-09-02 DIAGNOSIS — E559 Vitamin D deficiency, unspecified: Secondary | ICD-10-CM | POA: Insufficient documentation

## 2018-09-02 DIAGNOSIS — J449 Chronic obstructive pulmonary disease, unspecified: Secondary | ICD-10-CM | POA: Diagnosis not present

## 2018-09-02 DIAGNOSIS — Z842 Family history of other diseases of the genitourinary system: Secondary | ICD-10-CM | POA: Insufficient documentation

## 2018-09-02 DIAGNOSIS — C678 Malignant neoplasm of overlapping sites of bladder: Secondary | ICD-10-CM | POA: Diagnosis not present

## 2018-09-02 DIAGNOSIS — Z85118 Personal history of other malignant neoplasm of bronchus and lung: Secondary | ICD-10-CM | POA: Diagnosis not present

## 2018-09-02 HISTORY — PX: CYSTOSCOPY W/ RETROGRADES: SHX1426

## 2018-09-02 HISTORY — PX: CYSTOSCOPY WITH BIOPSY: SHX5122

## 2018-09-02 LAB — BASIC METABOLIC PANEL
ANION GAP: 10 (ref 5–15)
BUN: 16 mg/dL (ref 8–23)
CO2: 24 mmol/L (ref 22–32)
Calcium: 9.4 mg/dL (ref 8.9–10.3)
Chloride: 105 mmol/L (ref 98–111)
Creatinine, Ser: 0.95 mg/dL (ref 0.61–1.24)
GFR calc Af Amer: >60 mL/min (ref >60)
GFR calc non Af Amer: >60 mL/min (ref >60)
Glucose, Bld: 107 mg/dL — ABNORMAL HIGH (ref 70–99)
Potassium: 3.9 mmol/L (ref 3.5–5.1)
Sodium: 139 mmol/L (ref 135–145)

## 2018-09-02 LAB — POCT I-STAT 4, (NA,K, GLUC, HGB,HCT)
Glucose, Bld: 104 mg/dL — ABNORMAL HIGH (ref 70–99)
HCT: 41 % (ref 39.0–52.0)
Hemoglobin: 13.9 g/dL (ref 13.0–17.0)
Potassium: 4.1 mmol/L (ref 3.5–5.1)
SODIUM: 142 mmol/L (ref 135–145)

## 2018-09-02 SURGERY — CYSTOSCOPY, WITH BIOPSY
Anesthesia: General | Site: Urethra

## 2018-09-02 MED ORDER — CEFAZOLIN SODIUM-DEXTROSE 2-4 GM/100ML-% IV SOLN
INTRAVENOUS | Status: AC
  Filled 2018-09-02: qty 100

## 2018-09-02 MED ORDER — PROPOFOL 10 MG/ML IV BOLUS
INTRAVENOUS | Status: AC
  Filled 2018-09-02: qty 40

## 2018-09-02 MED ORDER — FENTANYL CITRATE (PF) 100 MCG/2ML IJ SOLN
INTRAMUSCULAR | Status: DC | PRN
  Administered 2018-09-02 (×2): 25 ug via INTRAVENOUS

## 2018-09-02 MED ORDER — EPHEDRINE SULFATE 50 MG/ML IJ SOLN
INTRAMUSCULAR | Status: DC | PRN
  Administered 2018-09-02 (×2): 5 mg via INTRAVENOUS

## 2018-09-02 MED ORDER — ONDANSETRON HCL 4 MG/2ML IJ SOLN
INTRAMUSCULAR | Status: AC
  Filled 2018-09-02: qty 2

## 2018-09-02 MED ORDER — EPHEDRINE 5 MG/ML INJ
INTRAVENOUS | Status: AC
  Filled 2018-09-02: qty 10

## 2018-09-02 MED ORDER — LIDOCAINE HCL (CARDIAC) PF 100 MG/5ML IV SOSY
PREFILLED_SYRINGE | INTRAVENOUS | Status: DC | PRN
  Administered 2018-09-02 (×2): 20 mg via INTRAVENOUS
  Administered 2018-09-02: 10 mg via INTRAVENOUS

## 2018-09-02 MED ORDER — FENTANYL CITRATE (PF) 100 MCG/2ML IJ SOLN
25.0000 ug | INTRAMUSCULAR | Status: DC | PRN
  Filled 2018-09-02: qty 1

## 2018-09-02 MED ORDER — PHENYLEPHRINE 40 MCG/ML (10ML) SYRINGE FOR IV PUSH (FOR BLOOD PRESSURE SUPPORT)
PREFILLED_SYRINGE | INTRAVENOUS | Status: AC
  Filled 2018-09-02: qty 10

## 2018-09-02 MED ORDER — STERILE WATER FOR IRRIGATION IR SOLN
Status: DC | PRN
  Administered 2018-09-02: 3000 mL

## 2018-09-02 MED ORDER — LIDOCAINE 2% (20 MG/ML) 5 ML SYRINGE
INTRAMUSCULAR | Status: AC
  Filled 2018-09-02: qty 5

## 2018-09-02 MED ORDER — PROPOFOL 10 MG/ML IV BOLUS
INTRAVENOUS | Status: DC | PRN
  Administered 2018-09-02: 20 mg via INTRAVENOUS
  Administered 2018-09-02: 50 mg via INTRAVENOUS

## 2018-09-02 MED ORDER — KETAMINE HCL 10 MG/ML IJ SOLN
INTRAMUSCULAR | Status: AC
  Filled 2018-09-02: qty 1

## 2018-09-02 MED ORDER — CEFAZOLIN SODIUM-DEXTROSE 2-4 GM/100ML-% IV SOLN
2.0000 g | INTRAVENOUS | Status: AC
  Administered 2018-09-02: 2 g via INTRAVENOUS
  Filled 2018-09-02: qty 100

## 2018-09-02 MED ORDER — SODIUM CHLORIDE 0.9 % IV SOLN: INTRAVENOUS | Status: DC | PRN

## 2018-09-02 MED ORDER — IOHEXOL 300 MG/ML  SOLN
INTRAMUSCULAR | Status: DC | PRN
  Administered 2018-09-02: 16 mL via URETHRAL

## 2018-09-02 MED ORDER — ONDANSETRON HCL 4 MG/2ML IJ SOLN
INTRAMUSCULAR | Status: DC | PRN
  Administered 2018-09-02: 4 mg via INTRAVENOUS

## 2018-09-02 MED ORDER — SULFAMETHOXAZOLE-TRIMETHOPRIM 800-160 MG PO TABS: 1.0000 | ORAL_TABLET | Freq: Two times a day (BID) | ORAL | 0 refills | Status: DC

## 2018-09-02 MED ORDER — LACTATED RINGERS IV SOLN
INTRAVENOUS | Status: DC
  Administered 2018-09-02: 1000 mL via INTRAVENOUS
  Filled 2018-09-02: qty 1000

## 2018-09-02 MED ORDER — PHENYLEPHRINE HCL 10 MG/ML IJ SOLN
INTRAMUSCULAR | Status: DC | PRN
  Administered 2018-09-02 (×2): 40 ug via INTRAVENOUS
  Administered 2018-09-02 (×2): 80 ug via INTRAVENOUS
  Administered 2018-09-02 (×2): 40 ug via INTRAVENOUS
  Administered 2018-09-02: 80 ug via INTRAVENOUS
  Administered 2018-09-02: 40 ug via INTRAVENOUS

## 2018-09-02 MED ORDER — FENTANYL CITRATE (PF) 100 MCG/2ML IJ SOLN
INTRAMUSCULAR | Status: AC
  Filled 2018-09-02: qty 2

## 2018-09-02 SURGICAL SUPPLY — 35 items
BAG DRAIN URO-CYSTO SKYTR STRL (DRAIN) ×4 IMPLANT
BAG DRN UROCATH (DRAIN) ×2
BASKET ZERO TIP NITINOL 2.4FR (BASKET) IMPLANT
BSKT STON RTRVL ZERO TP 2.4FR (BASKET)
CATH FOLEY 2WAY SLVR  5CC 18FR (CATHETERS) ×2
CATH FOLEY 2WAY SLVR 5CC 18FR (CATHETERS) IMPLANT
CATH INTERMIT  6FR 70CM (CATHETERS) ×4 IMPLANT
CLOTH BEACON ORANGE TIMEOUT ST (SAFETY) ×4 IMPLANT
CONT SPEC 4OZ CLIKSEAL STRL BL (MISCELLANEOUS) ×2 IMPLANT
ELECT REM PT RETURN 9FT ADLT (ELECTROSURGICAL) ×4
ELECTRODE REM PT RTRN 9FT ADLT (ELECTROSURGICAL) ×2 IMPLANT
FIBER LASER FLEXIVA 365 (UROLOGICAL SUPPLIES) IMPLANT
FIBER LASER TRAC TIP (UROLOGICAL SUPPLIES) IMPLANT
GLOVE BIO SURGEON STRL SZ8 (GLOVE) ×4 IMPLANT
GOWN STRL REUS W/ TWL LRG LVL3 (GOWN DISPOSABLE) ×2 IMPLANT
GOWN STRL REUS W/ TWL XL LVL3 (GOWN DISPOSABLE) ×2 IMPLANT
GOWN STRL REUS W/TWL LRG LVL3 (GOWN DISPOSABLE) ×4
GOWN STRL REUS W/TWL XL LVL3 (GOWN DISPOSABLE) ×4
GUIDEWIRE ANG ZIPWIRE 038X150 (WIRE) IMPLANT
GUIDEWIRE STR DUAL SENSOR (WIRE) ×2 IMPLANT
IV NS IRRIG 3000ML ARTHROMATIC (IV SOLUTION) ×6 IMPLANT
KIT TURNOVER CYSTO (KITS) ×4 IMPLANT
MANIFOLD NEPTUNE II (INSTRUMENTS) IMPLANT
NDL SAFETY ECLIPSE 18X1.5 (NEEDLE) IMPLANT
NEEDLE HYPO 18GX1.5 SHARP (NEEDLE)
NEEDLE HYPO 22GX1.5 SAFETY (NEEDLE) IMPLANT
NS IRRIG 500ML POUR BTL (IV SOLUTION) ×4 IMPLANT
PACK CYSTO (CUSTOM PROCEDURE TRAY) ×4 IMPLANT
SHEATH ACCESS URETERAL 38CM (SHEATH) IMPLANT
SYR 20CC LL (SYRINGE) IMPLANT
SYR CONTROL 10ML LL (SYRINGE) ×2 IMPLANT
TUBE CONNECTING 12'X1/4 (SUCTIONS)
TUBE CONNECTING 12X1/4 (SUCTIONS) IMPLANT
TUBING UROLOGY SET (TUBING) ×2 IMPLANT
WATER STERILE IRR 3000ML UROMA (IV SOLUTION) ×4 IMPLANT

## 2018-09-02 NOTE — Transfer of Care (Signed)
Immediate Anesthesia Transfer of Care Note  Patient: [name] [name]  Procedure(s) Performed: CYSTOSCOPY WITH BIOPSY (N/A Urethra) CYSTOSCOPY WITH RETROGRADE PYELOGRAM (Bilateral Urethra)  Patient Location: PACU  Anesthesia Type:General  Level of Consciousness: awake and patient cooperative  Airway & Oxygen Therapy: Patient Spontanous Breathing  Post-op Assessment: Report given to RN and Post -op Vital signs reviewed and stable  Post vital signs: Reviewed and stable  Last Vitals:  Vitals Value Taken Time  BP    Temp    Pulse    Resp    SpO2      Last Pain:  Vitals:   09/02/18 1143  TempSrc:   PainSc: 0-No pain      Patients Stated Pain Goal: 4 (98/[id number])  Complications: No apparent anesthesia complications

## 2018-09-02 NOTE — Discharge Instructions (Signed)
CYSTOSCOPY HOME CARE INSTRUCTIONS  Activity: Rest for the remainder of the day.  Do not drive or operate equipment today.  You may resume normal activities in one to two days as instructed by your physician.   Meals: Drink plenty of liquids and eat light foods such as gelatin or soup this evening.  You may return to a normal meal plan tomorrow.  Return to Work: You may return to work in one to two days or as instructed by your physician.  Special Instructions / Symptoms: Call your physician if any of these symptoms occur::  Do NOT drive or operate any equipment for 24 hours. The effects of anesthesia are still present and drowsiness may result.  Do NOT rest in bed all day.  Walking is encouraged.  Walk up and down stairs slowly.  You may resume your normal activity in one to two days or as indicated by your physician. e 6 hours after discharge from hospital.  Pain not relieved by pain medication.  Fever of 100.4 F or greater..   1. You may see some blood in the urine and may have some burning with urination for 48-72 hours. You also may notice that you have to urinate more frequently or urgently after your procedure which is normal.  2. You should call should you develop an inability urinate, fever > 101, persistent nausea and vomiting that prevents you from eating or drinking to stay hydrated.  3. If you have a stent, you will likely urinate more frequently and urgently until the stent is removed and you may experience some discomfort/pain in the lower abdomen and flank especially when urinating. You may take pain medication prescribed to you if needed for pain. You may also intermittently have blood in the urine until the stent is removed. 4. If you have a catheter, you will be taught how to take care of the catheter by the nursing staff prior to discharge from the hospital.  You may periodically feel a strong urge to void with the catheter in place.  This is a bladder spasm and  most often can occur when having a bowel movement or moving around. It is typically self-limited and usually will stop after a few minutes.  You may use some Vaseline or Neosporin around the tip of the catheter to reduce friction at the tip of the penis. You may also see some blood in the urine.  A very small amount of blood can make the urine look quite red.  As long as the catheter is draining well, there usually is not a problem.  However, if the catheter is not draining well and is bloody, you should call the office [phone]) to notify us.  It is okay to remove the catheter on Friday morning, follow the instructions of the nurse   Post Anesthesia Home Care Instructions  Activity: Get plenty of rest for the remainder of the day. A responsible individual must stay with you for 24 hours following the procedure.  For the next 24 hours, DO NOT: -Drive a car -Paediatric nurse -Drink alcoholic beverages -Take any medication unless instructed by your physician -Make any legal decisions or sign important papers.  Meals: Start with liquid foods such as gelatin or soup. Progress to regular foods as tolerated. Avoid greasy, spicy, heavy foods. If nausea and/or vomiting occur, drink only clear liquids until the nausea and/or vomiting subsides. Call your physician if vomiting continues.  Special Instructions/Symptoms: Your throat may feel dry or sore from the  anesthesia or the breathing tube placed in your throat during surgery. If this causes discomfort, gargle with warm salt water. The discomfort should disappear within 24 hours.  If you had a scopolamine patch placed behind your ear for the management of post- operative nausea and/or vomiting:  1. The medication in the patch is effective for 72 hours, after which it should be removed.  Wrap patch in a tissue and discard in the trash. Wash hands thoroughly with soap and water. 2. You may remove the patch earlier than 72 hours if you experience  unpleasant side effects which may include dry mouth, dizziness or visual disturbances. 3. Avoid touching the patch. Wash your hands with soap and water after contact with the patch.

## 2018-09-02 NOTE — Interval H&P Note (Signed)
History and Physical Interval Note:  09/02/2018 11:54 AM  [name]  has presented today for surgery, with the diagnosis of BLADDER CANCER.  The various methods of treatment have been discussed with the patient and family. After consideration of risks, benefits and other options for treatment, the patient has consented to  Procedure(s): CYSTOSCOPY WITH BIOPSY (N/A) CYSTOSCOPY WITH RETROGRADE PYELOGRAM (Bilateral) as a surgical intervention.  The patient's history has been reviewed, patient examined, no change in status, stable for surgery.  I have reviewed the patient's chart and labs.  Questions were answered to the patient's satisfaction.     [name] [name]

## 2018-09-02 NOTE — Op Note (Signed)
Preoperative diagnosis: History of bladder cancer with recent positive cytology  Postoperative diagnosis: History of bladder cancer with small papillary recurrence, no evidence of upper tract or other bladder abnormalities  Principal procedure: Cystoscopy, bilateral retrograde ureteral pyelograms, bilateral renal washings, bladder biopsy, fluoroscopic interpretation  Surgeon: Conor Lata  Anesthesia: General with LMA  Complications: None  Specimen: 1.  Right renal washings for cytology 2.  Left renal washings for cytology 3.  Bladder tumor/bladder biopsy  Estimated blood loss: Less than 5 mL  Drains: 18 French Foley catheter to leg bag  Indications: 78 year old male with history of high-grade bladder cancer.  Recent cytology after negative cystoscopy revealed evidence of high-grade urothelial carcinoma.  He presents at this time for cystoscopy, retrogrades and bladder biopsy.  I discussed the procedure as well as risks and complications with the patient.  He has been through this procedure previously and understands these and desire to proceed.  Findings: Right retrograde ureteropyelogram revealed normal ureter, small, delicate pyelo-calyceal system without evidence of filling defects.  No hydronephrosis.  Left retrograde ureteropyelogram revealed normal ureter, small, delicate pyelocalyceal system, no evidence of filling defects or hydronephrosis.  Bladder revealed 1 small papillary recurrence in the midline of the posterior bladder approximately 4 mm in size.  No other urothelial abnormalities were noted.  Old resection site and right posterior bladder revealed expected scarring but no other abnormality.  Description of procedure: The patient was properly identified in the holding area.  He received preoperative IV antibiotics.  Was taken to the operating room where general anesthetic was administered with the LMA.  He was placed in the dorsolithotomy position.  Genitalia and perineum were  prepped and draped.  Proper timeout was performed.  21 French panendoscope passed under direct vision.  Prostate was moderately obstructive with right greater than left lobar hyperplasia.  Bladder was inspected.  Ureteral orifice ease normal in configuration location.  Old resection site seen in the right posterior bladder which appeared normal.  To the left of this, approximately 2 cm, was a 3 to 4 mm papillary lesion.  No other urothelial abnormalities were noted.  Using a 6 [place] open-ended catheter, bilateral renal washings were taken separately using saline.  Washings were sent separately labeled right renal washings and left renal washings for cytology.  Following renal washings on each side, retrograde ureteropyelogram's were performed using Omnipaque with the above-mentioned findings.  Following this, cold cup biopsy forceps were utilized to resect the small papillary recurrence in the bladder.  This was then cauterized with the Bugbee electrode.  Additionally, the bladder neck at the 6 o'clock position had slight raised urothelium, most likely benign in nature.  However, this was also cauterized with the Bugbee.  At this point, there was hemostasis.  The scope was removed, the bladder was drained with an 28 French Foley catheter which was left indwelling.  This was hooked to a leg bag.  At this point the procedure was terminated.  The patient was awakened and taken to the PACU in stable condition.  He tolerated the procedure well.

## 2018-09-02 NOTE — Anesthesia Preprocedure Evaluation (Addendum)
Anesthesia Evaluation  Patient identified by MRN, date of birth, ID band Patient awake    Reviewed: Allergy & Precautions, NPO status , Patient's Chart, lab work & pertinent test results, reviewed documented beta blocker date and time   Airway Mallampati: I  TM Distance: >3 FB Neck ROM: Full    Dental no notable dental hx. (+) Edentulous Upper, Edentulous Lower   Pulmonary COPD, former smoker,  H/o lung CA s/p right lobectomy   Pulmonary exam normal breath sounds clear to auscultation       Cardiovascular hypertension, Pt. on home beta blockers and Pt. on medications + CAD (treated medically) and + Peripheral Vascular Disease (AAA s/p repair 2011, b/l ICA stenosis <40%)  Normal cardiovascular exam+ dysrhythmias (not on anticoagulation) Atrial Fibrillation + Valvular Problems/Murmurs (s/p TAVR 02/2017) AS  Rhythm:Regular Rate:Normal  TTE 2019 EF 55-60%, AV with TAVR bioprosthesis, no stenosis  LHC 2018 1. Moderate proximal LAD stenosis 2. Patent left main, left circumflex, and RCA with mild nonobstructive disease 3. Severely calcified and restricted aortic valve with known severe aortic stenosis by noninvasive assessment 4. Mild pulmonary hypertension likely related to LV diastolic dysfunction in the setting of severe aortic stenosis   Neuro/Psych negative neurological ROS  negative psych ROS   GI/Hepatic negative GI ROS, Neg liver ROS,   Endo/Other  Hypothyroidism   Renal/GU negative Renal ROS  negative genitourinary   Musculoskeletal  (+) Arthritis ,   Abdominal   Peds  Hematology negative hematology ROS (+)   Anesthesia Other Findings Bladder cancer  Reproductive/Obstetrics                            Anesthesia Physical Anesthesia Plan  ASA: III  Anesthesia Plan: General   Post-op Pain Management:    Induction: Intravenous  PONV Risk Score and Plan: 2 and Ondansetron and  Dexamethasone  Airway Management Planned: LMA  Additional Equipment:   Intra-op Plan:   Post-operative Plan: Extubation in OR  Informed Consent: I have reviewed the patients History and Physical, chart, labs and discussed the procedure including the risks, benefits and alternatives for the proposed anesthesia with the patient or authorized representative who has indicated his/her understanding and acceptance.     Dental advisory given  Plan Discussed with: CRNA  Anesthesia Plan Comments:         Anesthesia Quick Evaluation

## 2018-09-02 NOTE — Anesthesia Postprocedure Evaluation (Signed)
Anesthesia Post Note  Patient: [name] [name]  Procedure(s) Performed: CYSTOSCOPY WITH BIOPSY (N/A Urethra) CYSTOSCOPY WITH RETROGRADE PYELOGRAM (Bilateral Urethra)     Patient location during evaluation: PACU Anesthesia Type: General Level of consciousness: awake and alert Pain management: pain level controlled Vital Signs Assessment: post-procedure vital signs reviewed and stable Respiratory status: spontaneous breathing, nonlabored ventilation, respiratory function stable and patient connected to nasal cannula oxygen Cardiovascular status: blood pressure returned to baseline and stable Postop Assessment: no apparent nausea or vomiting Anesthetic complications: no    Last Vitals:  Vitals:   09/02/18 1344 09/02/18 1452  BP:  (!) 160/89  Pulse: 70 60  Resp: 11 16  Temp: 36.9 C 36.8 C  SpO2: 95% 98%    Last Pain:  Vitals:   09/02/18 1452  TempSrc:   PainSc: 2                  Chelsey L Woodrum

## 2018-09-03 ENCOUNTER — Encounter (HOSPITAL_BASED_OUTPATIENT_CLINIC_OR_DEPARTMENT_OTHER): Payer: Self-pay | Admitting: Urology

## 2018-09-03 NOTE — Addendum Note (Signed)
Addendum  created 09/03/18 1229 by Freddrick March, MD   Intraprocedure Staff edited

## 2018-09-13 ENCOUNTER — Ambulatory Visit (HOSPITAL_COMMUNITY)
Admission: RE | Admit: 2018-09-13 | Discharge: 2018-09-13 | Disposition: A | Discharge status: Home / Self Care | Payer: Medicare Other | Source: Ambulatory Visit | Attending: Internal Medicine | Admitting: Internal Medicine

## 2018-09-13 ENCOUNTER — Telehealth: Payer: Self-pay | Admitting: Medical Oncology

## 2018-09-13 ENCOUNTER — Other Ambulatory Visit: Payer: Self-pay

## 2018-09-13 ENCOUNTER — Inpatient Hospital Stay: Payer: Medicare Other | Attending: Internal Medicine

## 2018-09-13 ENCOUNTER — Encounter (HOSPITAL_COMMUNITY): Payer: Self-pay

## 2018-09-13 DIAGNOSIS — C3490 Malignant neoplasm of unspecified part of unspecified bronchus or lung: Secondary | ICD-10-CM | POA: Diagnosis not present

## 2018-09-13 DIAGNOSIS — J90 Pleural effusion, not elsewhere classified: Secondary | ICD-10-CM | POA: Diagnosis not present

## 2018-09-13 DIAGNOSIS — C3411 Malignant neoplasm of upper lobe, right bronchus or lung: Secondary | ICD-10-CM | POA: Insufficient documentation

## 2018-09-13 LAB — CMP (CANCER CENTER ONLY)
ALT: 37 U/L (ref 0–44)
AST: 35 U/L (ref 15–41)
Albumin: 4.3 g/dL (ref 3.5–5.0)
Alkaline Phosphatase: 77 U/L (ref 38–126)
Anion gap: 11 (ref 5–15)
BUN: 15 mg/dL (ref 8–23)
CHLORIDE: 107 mmol/L (ref 98–111)
CO2: 25 mmol/L (ref 22–32)
Calcium: 9.3 mg/dL (ref 8.9–10.3)
Creatinine: 1.18 mg/dL (ref 0.61–1.24)
GFR, Est AFR Am: >60 mL/min (ref >60)
GFR, Estimated: 59 mL/min — ABNORMAL LOW (ref >60)
Glucose, Bld: 105 mg/dL — ABNORMAL HIGH (ref 70–99)
Potassium: 4.8 mmol/L (ref 3.5–5.1)
SODIUM: 143 mmol/L (ref 135–145)
Total Bilirubin: 0.7 mg/dL (ref 0.3–1.2)
Total Protein: 8.3 g/dL — ABNORMAL HIGH (ref 6.5–8.1)

## 2018-09-13 LAB — CBC WITH DIFFERENTIAL (CANCER CENTER ONLY)
Abs Immature Granulocytes: 0.02 10*3/uL (ref 0.00–0.07)
BASOS PCT: 1 %
Basophils Absolute: 0.1 10*3/uL (ref 0.0–0.1)
Eosinophils Absolute: 0.3 10*3/uL (ref 0.0–0.5)
Eosinophils Relative: 4 %
HCT: 40.3 % (ref 39.0–52.0)
Hemoglobin: 13.2 g/dL (ref 13.0–17.0)
Immature Granulocytes: 0 %
Lymphocytes Relative: 24 %
Lymphs Abs: 1.7 10*3/uL (ref 0.7–4.0)
MCH: 31.6 pg (ref 26.0–34.0)
MCHC: 32.8 g/dL (ref 30.0–36.0)
MCV: 96.4 fL (ref 80.0–100.0)
MONOS PCT: 8 %
Monocytes Absolute: 0.6 10*3/uL (ref 0.1–1.0)
Neutro Abs: 4.4 10*3/uL (ref 1.7–7.7)
Neutrophils Relative %: 63 %
Platelet Count: 184 10*3/uL (ref 150–400)
RBC: 4.18 MIL/uL — AB (ref 4.22–5.81)
RDW: 13.2 % (ref 11.5–15.5)
WBC Count: 7 10*3/uL (ref 4.0–10.5)
nRBC: 0 % (ref 0.0–0.2)

## 2018-09-13 MED ORDER — IOHEXOL 300 MG/ML  SOLN
75.0000 mL | Freq: Once | INTRAMUSCULAR | Status: AC | PRN
  Administered 2018-09-13: 75 mL via INTRAVENOUS

## 2018-09-13 MED ORDER — SODIUM CHLORIDE (PF) 0.9 % IJ SOLN
INTRAMUSCULAR | Status: AC
  Filled 2018-09-13: qty 50

## 2018-09-13 NOTE — Telephone Encounter (Signed)
appt changed to tomorrow and it will be an evisit.at 1015.

## 2018-09-14 ENCOUNTER — Other Ambulatory Visit: Payer: Self-pay | Admitting: Internal Medicine

## 2018-09-14 ENCOUNTER — Inpatient Hospital Stay (HOSPITAL_BASED_OUTPATIENT_CLINIC_OR_DEPARTMENT_OTHER): Payer: Medicare Other | Admitting: Internal Medicine

## 2018-09-14 DIAGNOSIS — C3411 Malignant neoplasm of upper lobe, right bronchus or lung: Secondary | ICD-10-CM | POA: Diagnosis not present

## 2018-09-14 DIAGNOSIS — C3490 Malignant neoplasm of unspecified part of unspecified bronchus or lung: Secondary | ICD-10-CM

## 2018-09-14 NOTE — Progress Notes (Signed)
[name] [name] [phone]   [phone] [place], [name] [name], [place] [address]   Virtual Visit via Telephone Note  I connected with [name] [name] on 09/14/18 at 10:15 AM EDT by telephone and verified that I am speaking with the correct person using two identifiers.   I discussed the limitations, risks, security and privacy concerns of performing an evaluation and management service by telephone and the availability of in person appointments. I also discussed with the patient that there may be a patient responsible charge related to this service. The patient expressed understanding and agreed to proceed.  DIAGNOSIS: Stage IA (T1a, N0, M0) non-small cell lung cancer, squamous cell carcinoma presented with right upper lobe lung nodule   PRIOR THERAPY: status post right upper lobectomy with lymph node dissection on March 30, 2017 under the care of Dr. Roxan Hockey.  CURRENT THERAPY: Observation.  History of Present Illness: [name] is a 78 years old white male who has a telephone visual visit because of the current COVID 19 situation.  The patient is feeling fine today with no concerning complaints except for mild shortness of breath with exertion.  He is scheduled for another bladder surgery for superficial tumor of the bladder.  He denied having any chest pain, cough or hemoptysis.  He denied having any fever or chills.  He has no nausea, vomiting, diarrhea or constipation.  He has no recent weight loss or night sweats.  The patient had repeat CT scan of the chest performed recently and I called him to discuss the lab and scan results.   MEDICAL HISTORY: Past Medical History:  Diagnosis Date  . AAA (abdominal aortic aneurysm) ([place])   . Aortic stenosis, severe    a. 02/2017: s/p TAVR with Edwards Sapien 3 THV (size 26 mm, model # 9600TFX, serial # [id number])  . Arthritis   . Atherosclerotic  peripheral vascular disease ([place])   . Atrial fibrillation, persistent    a. not a candidate for [place] given hx of GI bleed  . Bilateral carotid artery stenosis   . Bladder cancer ([place])   . COPD (chronic obstructive pulmonary disease) ([place])   . Hyperlipidemia   . Hypertension   . Hypothyroidism   . Lung cancer ([place])    Right  . Lung mass    Right  . Skin cancer   . Vitamin D deficiency     ALLERGIES:  is allergic to eliquis [apixaban] and lovenox [enoxaparin].  MEDICATIONS:  Current Outpatient Medications  Medication Sig Dispense Refill  . alfuzosin (UROXATRAL) 10 MG 24 hr tablet Take 10 mg by mouth at bedtime.     Marland Kitchen amLODipine (NORVASC) 5 MG tablet Take 1 tablet (5 mg total) by mouth daily. 30 tablet 4  . aspirin EC 81 MG tablet Take 1 tablet (81 mg total) by mouth daily. 90 tablet 3  . atorvastatin (LIPITOR) 40 MG tablet Take 1 tablet (40 mg total) by mouth daily. 30 tablet 4  . cholecalciferol (VITAMIN D) 1000 UNITS tablet Take 1 tablet (1,000 Units total) by mouth daily. 30 tablet 6  . ferrous gluconate (FERGON) 324 MG tablet TAKE (1) TABLET TWICE A DAY WITH MEALS (BREAKFAST AND SUPPER) 60 tablet 3  . levothyroxine (SYNTHROID, LEVOTHROID) 75 MCG tablet Take 1 tablet (75 mcg total) by mouth daily. 30 tablet 8  . lisinopril (PRINIVIL,ZESTRIL) 40 MG tablet Take 1 tablet (40 mg total) by mouth  daily. 30 tablet 4  . metoprolol tartrate (LOPRESSOR) 25 MG tablet Take 0.5 tablets (12.5 mg total) by mouth 2 (two) times daily. 30 tablet 6  . Multiple Vitamin (ONE-A-DAY MENS PO) Take 1 tablet by mouth daily.      . naproxen (NAPROSYN) 500 MG tablet Take 1 tablet (500 mg total) by mouth 2 (two) times daily with a meal. 30 tablet 1  . Omega-3 Fatty Acids (FISH OIL) 1200 MG CAPS Take 2,400 mg by mouth daily.     . pantoprazole (PROTONIX) 40 MG tablet Take 1 tablet (40 mg total) by mouth 2 (two) times daily. 60 tablet 2  . sulfamethoxazole-trimethoprim (BACTRIM DS,SEPTRA DS) 800-160 MG tablet  Take 1 tablet by mouth 2 (two) times daily. 6 tablet 0   No current facility-administered medications for this visit.     SURGICAL HISTORY:  Past Surgical History:  Procedure Laterality Date  . ABDOMINAL AORTIC ANEURYSM REPAIR  08-09-09  . BACK SURGERY    . CYSTOSCOPY W/ RETROGRADES Bilateral 09/02/2018   Procedure: CYSTOSCOPY WITH RETROGRADE PYELOGRAM;  Surgeon: Franchot Gallo, MD;  Location: [facility];  Service: Urology;  Laterality: Bilateral;  . CYSTOSCOPY WITH BIOPSY N/A 09/02/2018   Procedure: CYSTOSCOPY WITH BIOPSY;  Surgeon: Franchot Gallo, MD;  Location: [facility];  Service: Urology;  Laterality: N/A;  . ESOPHAGOGASTRODUODENOSCOPY N/A 02/21/2017   Procedure: ESOPHAGOGASTRODUODENOSCOPY (EGD);  Surgeon: Jerene Bears, MD;  [facility];  Service: Gastroenterology;  Laterality: N/A;  . HYDROCELE EXCISION / REPAIR    . LOBECTOMY Right 03/30/2017   Procedure: RIGHT UPPER LOBECTOMY LUNG;  Surgeon: Melrose Nakayama, MD;  Location: [facility];  Service: Thoracic;  Laterality: Right;  . SKIN CANCER EXCISION    . SPINE SURGERY    . TEE WITHOUT CARDIOVERSION N/A 02/24/2017   Procedure: TRANSESOPHAGEAL ECHOCARDIOGRAM (TEE);  Surgeon: Sherren Mocha, MD;  Location: [facility];  Service: Open Heart Surgery;  Laterality: N/A;  . TRANSCATHETER AORTIC VALVE REPLACEMENT, TRANSFEMORAL N/A 02/24/2017   Procedure: TRANSCATHETER AORTIC VALVE REPLACEMENT, TRANSFEMORAL using a 55mm Edwards Sapien 3 Aortic Valve;  Surgeon: Sherren Mocha, MD;  Location: [facility];  Service: Open Heart Surgery;  Laterality: N/A;  . TRANSURETHRAL RESECTION OF BLADDER TUMOR N/A 06/06/2016   Procedure: TRANSURETHRAL RESECTION OF BLADDER TUMOR (TURBT);  Surgeon: Franchot Gallo, MD;  Location: [facility];  Service: Urology;  Laterality: N/A;  . TRANSURETHRAL RESECTION OF BLADDER TUMOR N/A 07/22/2016   Procedure: TRANSURETHRAL RESECTION OF BLADDER TUMOR (TURBT);  Surgeon: Franchot Gallo, MD;   Location: [facility];  Service: Urology;  Laterality: N/A;  . VIDEO ASSISTED THORACOSCOPY (VATS)/WEDGE RESECTION Right 03/30/2017   Procedure: VIDEO ASSISTED THORACOSCOPY (VATS)/WEDGE RESECTION;  Surgeon: Melrose Nakayama, MD;  Location: [facility];  Service: Thoracic;  Laterality: Right;    LABORATORY DATA: Lab Results  Component Value Date   WBC 7.0 09/13/2018   HGB 13.2 09/13/2018   HCT 40.3 09/13/2018   MCV 96.4 09/13/2018   PLT 184 09/13/2018      Chemistry      Component Value Date/Time   NA 143 09/13/2018 0753   NA 142 05/24/2018 1049   NA 140 05/07/2017 1419   K 4.8 09/13/2018 0753   K 4.2 05/07/2017 1419   CL 107 09/13/2018 0753   CO2 25 09/13/2018 0753   CO2 25 05/07/2017 1419   BUN 15 09/13/2018 0753   BUN 14 05/24/2018 1049   BUN 14.5 05/07/2017 1419   CREATININE 1.18 09/13/2018 0753  CREATININE 1.0 05/07/2017 1419      Component Value Date/Time   CALCIUM 9.3 09/13/2018 0753   CALCIUM 9.6 05/07/2017 1419   ALKPHOS 77 09/13/2018 0753   ALKPHOS 85 05/07/2017 1419   AST 35 09/13/2018 0753   AST 23 05/07/2017 1419   ALT 37 09/13/2018 0753   ALT 14 05/07/2017 1419   BILITOT 0.7 09/13/2018 0753   BILITOT 0.48 05/07/2017 1419       RADIOGRAPHIC STUDIES: Ct Chest W Contrast  Result Date: 09/13/2018 CLINICAL DATA:  History of lung cancer, status post right upper lobectomy, history of bladder cancer EXAM: CT CHEST WITH CONTRAST TECHNIQUE: Multidetector CT imaging of the chest was performed during intravenous contrast administration. CONTRAST:  39mL OMNIPAQUE IOHEXOL 300 MG/ML  SOLN COMPARISON:  03/15/2018, 11/05/2017 FINDINGS: Cardiovascular: Three-vessel coronary artery calcifications. Status post aortic valve stent endograft repair. Aortic atherosclerosis. Normal heart size. No pericardial effusion. Mediastinum/Nodes: No enlarged mediastinal, hilar, or axillary lymph nodes. Thyroid gland, trachea, and esophagus demonstrate no significant findings. Lungs/Pleura:  Status post right upper lobectomy. Moderate underlying emphysema. Trace right pleural effusion and/or pleural thickening. Upper Abdomen: No acute abnormality. Musculoskeletal: No chest wall mass or suspicious bone lesions identified. IMPRESSION: 1. Status post right upper lobectomy. Trace right pleural effusion and/or pleural thickening. No evidence of recurrent or metastatic malignancy. 2.  Emphysema. 3. Coronary artery disease, aortic atherosclerosis, and aortic valve stent endograft. Electronically Signed   By: Eddie Candle M.D.   On: 09/13/2018 09:43   Assessment and Plan: This is a very pleasant 78 years old white male with history of stage Ia non-small cell lung cancer, squamous cell carcinoma status post right upper lobectomy with lymph node dissection in October 2018. The patient is currently on observation. He had repeat CT scan of the chest performed recently.  I personally and independently reviewed the scans and discussed the results with the patient by phone today. His scan showed no concerning findings for disease recurrence or progression. I recommended for the patient to continue on observation with repeat CT scan of the chest in 6 months. He was advised to call immediately if he has any concerning symptoms in the interval.  Follow Up Instructions: Follow-up visit with lab and scan in 6 months.   I discussed the assessment and treatment plan with the patient. The patient was provided an opportunity to ask questions and all were answered. The patient agreed with the plan and demonstrated an understanding of the instructions.   The patient was advised to call back or seek an in-person evaluation if the symptoms worsen or if the condition fails to improve as anticipated.  I provided 15 minutes of non-face-to-face time during this encounter.   Eilleen Kempf, MD  Disclaimer: This note was dictated with voice recognition software. Similar sounding words can inadvertently be  transcribed and may not be corrected upon review.

## 2018-09-15 ENCOUNTER — Ambulatory Visit: Payer: Medicare Other | Admitting: Internal Medicine

## 2018-09-15 ENCOUNTER — Telehealth: Payer: Self-pay | Admitting: Internal Medicine

## 2018-09-15 NOTE — Telephone Encounter (Signed)
Scheduled appt per 3/24 sch message - sent reminder letter in the mail. Lab and f/u in 6 months.

## 2018-10-20 ENCOUNTER — Other Ambulatory Visit: Payer: Self-pay | Admitting: Nurse Practitioner

## 2018-11-17 ENCOUNTER — Other Ambulatory Visit: Payer: Self-pay | Admitting: Nurse Practitioner

## 2018-11-17 DIAGNOSIS — K219 Gastro-esophageal reflux disease without esophagitis: Secondary | ICD-10-CM

## 2018-11-23 ENCOUNTER — Ambulatory Visit (INDEPENDENT_AMBULATORY_CARE_PROVIDER_SITE_OTHER): Payer: Medicare Other | Admitting: Urology

## 2018-11-23 ENCOUNTER — Other Ambulatory Visit: Payer: Self-pay

## 2018-11-23 ENCOUNTER — Encounter: Payer: Self-pay | Admitting: Nurse Practitioner

## 2018-11-23 ENCOUNTER — Ambulatory Visit (INDEPENDENT_AMBULATORY_CARE_PROVIDER_SITE_OTHER): Payer: Medicare Other | Admitting: Nurse Practitioner

## 2018-11-23 VITALS — BP 145/69 | HR 67 | Temp 97.6°F | Ht 70.0 in | Wt 182.0 lb

## 2018-11-23 DIAGNOSIS — E559 Vitamin D deficiency, unspecified: Secondary | ICD-10-CM

## 2018-11-23 DIAGNOSIS — I6523 Occlusion and stenosis of bilateral carotid arteries: Secondary | ICD-10-CM | POA: Diagnosis not present

## 2018-11-23 DIAGNOSIS — C678 Malignant neoplasm of overlapping sites of bladder: Secondary | ICD-10-CM

## 2018-11-23 DIAGNOSIS — J449 Chronic obstructive pulmonary disease, unspecified: Secondary | ICD-10-CM | POA: Diagnosis not present

## 2018-11-23 DIAGNOSIS — Z6826 Body mass index (BMI) 26.0-26.9, adult: Secondary | ICD-10-CM

## 2018-11-23 DIAGNOSIS — E034 Atrophy of thyroid (acquired): Secondary | ICD-10-CM | POA: Diagnosis not present

## 2018-11-23 DIAGNOSIS — I4821 Permanent atrial fibrillation: Secondary | ICD-10-CM

## 2018-11-23 DIAGNOSIS — I5031 Acute diastolic (congestive) heart failure: Secondary | ICD-10-CM | POA: Diagnosis not present

## 2018-11-23 DIAGNOSIS — E782 Mixed hyperlipidemia: Secondary | ICD-10-CM | POA: Diagnosis not present

## 2018-11-23 DIAGNOSIS — I10 Essential (primary) hypertension: Secondary | ICD-10-CM | POA: Diagnosis not present

## 2018-11-23 DIAGNOSIS — K219 Gastro-esophageal reflux disease without esophagitis: Secondary | ICD-10-CM

## 2018-11-23 DIAGNOSIS — C3491 Malignant neoplasm of unspecified part of right bronchus or lung: Secondary | ICD-10-CM

## 2018-11-23 LAB — LIPID PANEL

## 2018-11-23 MED ORDER — LISINOPRIL 40 MG PO TABS: 40.0000 mg | ORAL_TABLET | Freq: Every day | ORAL | 1 refills | Status: DC

## 2018-11-23 MED ORDER — AMLODIPINE BESYLATE 5 MG PO TABS: 5.0000 mg | ORAL_TABLET | Freq: Every day | ORAL | 1 refills | Status: DC

## 2018-11-23 MED ORDER — ATORVASTATIN CALCIUM 40 MG PO TABS: 40.0000 mg | ORAL_TABLET | Freq: Every day | ORAL | 1 refills | Status: DC

## 2018-11-23 MED ORDER — LEVOTHYROXINE SODIUM 75 MCG PO TABS: 75.0000 ug | ORAL_TABLET | Freq: Every day | ORAL | 1 refills | Status: DC

## 2018-11-23 MED ORDER — METOPROLOL TARTRATE 25 MG PO TABS: 12.5000 mg | ORAL_TABLET | Freq: Two times a day (BID) | ORAL | 1 refills | Status: DC

## 2018-11-23 MED ORDER — PANTOPRAZOLE SODIUM 40 MG PO TBEC: DELAYED_RELEASE_TABLET | ORAL | 1 refills | Status: DC

## 2018-11-23 NOTE — Patient Instructions (Signed)
Fall Prevention in the Home, Adult  Falls can cause injuries. They can happen to people of all ages. There are many things you can do to make your home safe and to help prevent falls. Ask for help when making these changes, if needed.  What actions can I take to prevent falls?  General Instructions  · Use good lighting in all rooms. Replace any light bulbs that burn out.  · Turn on the lights when you go into a dark area. Use night-lights.  · Keep items that you use often in easy-to-reach places. Lower the shelves around your home if necessary.  · Set up your furniture so you have a clear path. Avoid moving your furniture around.  · Do not have throw rugs and other things on the floor that can make you trip.  · Avoid walking on wet floors.  · If any of your floors are uneven, fix them.  · Add color or contrast paint or tape to clearly mark and help you see:  ? Any grab bars or handrails.  ? First and last steps of stairways.  ? Where the edge of each step is.  · If you use a stepladder:  ? Make sure that it is fully opened. Do not climb a closed stepladder.  ? Make sure that both sides of the stepladder are locked into place.  ? Ask someone to hold the stepladder for you while you use it.  · If there are any pets around you, be aware of where they are.  What can I do in the bathroom?         · Keep the floor dry. Clean up any water that spills onto the floor as soon as it happens.  · Remove soap buildup in the tub or shower regularly.  · Use non-skid mats or decals on the floor of the tub or shower.  · Attach bath mats securely with double-sided, non-slip rug tape.  · If you need to sit down in the shower, use a plastic, non-slip stool.  · Install grab bars by the toilet and in the tub and shower. Do not use towel bars as grab bars.  What can I do in the bedroom?  · Make sure that you have a light by your bed that is easy to reach.  · Do not use any sheets or blankets that are too big for your bed. They should  not hang down onto the floor.  · Have a firm chair that has side arms. You can use this for support while you get dressed.  What can I do in the kitchen?  · Clean up any spills right away.  · If you need to reach something above you, use a strong step stool that has a grab bar.  · Keep electrical cords out of the way.  · Do not use floor polish or wax that makes floors slippery. If you must use wax, use non-skid floor wax.  What can I do with my stairs?  · Do not leave any items on the stairs.  · Make sure that you have a light switch at the top of the stairs and the bottom of the stairs. If you do not have them, ask someone to add them for you.  · Make sure that there are handrails on both sides of the stairs, and use them. Fix handrails that are broken or loose. Make sure that handrails are as long as the stairways.  ·   Install non-slip stair treads on all stairs in your home.  · Avoid having throw rugs at the top or bottom of the stairs. If you do have throw rugs, attach them to the floor with carpet tape.  · Choose a carpet that does not hide the edge of the steps on the stairway.  · Check any carpeting to make sure that it is firmly attached to the stairs. Fix any carpet that is loose or worn.  What can I do on the outside of my home?  · Use bright outdoor lighting.  · Regularly fix the edges of walkways and driveways and fix any cracks.  · Remove anything that might make you trip as you walk through a door, such as a raised step or threshold.  · Trim any bushes or trees on the path to your home.  · Regularly check to see if handrails are loose or broken. Make sure that both sides of any steps have handrails.  · Install guardrails along the edges of any raised decks and porches.  · Clear walking paths of anything that might make someone trip, such as tools or rocks.  · Have any leaves, snow, or ice cleared regularly.  · Use sand or salt on walking paths during winter.  · Clean up any spills in your garage right  away. This includes grease or oil spills.  What other actions can I take?  · Wear shoes that:  ? Have a low heel. Do not wear high heels.  ? Have rubber bottoms.  ? Are comfortable and fit you well.  ? Are closed at the toe. Do not wear open-toe sandals.  · Use tools that help you move around (mobility aids) if they are needed. These include:  ? Canes.  ? Walkers.  ? Scooters.  ? Crutches.  · Review your medicines with your doctor. Some medicines can make you feel dizzy. This can increase your chance of falling.  Ask your doctor what other things you can do to help prevent falls.  Where to find more information  · Centers for Disease Control and Prevention, [place]: https://cdc.gov  · National Institute on Aging: https://go4life.nia.nih.gov  Contact a doctor if:  · You are afraid of falling at home.  · You feel weak, drowsy, or dizzy at home.  · You fall at home.  Summary  · There are many simple things that you can do to make your home safe and to help prevent falls.  · Ways to make your home safe include removing tripping hazards and installing grab bars in the bathroom.  · Ask for help when making these changes in your home.  This information is not intended to replace advice given to you by your health care provider. Make sure you discuss any questions you have with your health care provider.  Document Released: 04/05/2009 Document Revised: 01/22/2017 Document Reviewed: 01/22/2017  Elsevier Interactive Patient Education © 2019 Elsevier Inc.

## 2018-11-23 NOTE — Progress Notes (Signed)
Subjective:    Patient ID: [name] [name], male    DOB: [birth year], 79 y.o.   MRN: [id number]   Chief Complaint: Medical Management of Chronic Issues    HPI:  1. Essential hypertension No c/o chest pain, sob or headache. Does check blood pressure at home. Usually runs 142/70. BP Readings from Last 3 Encounters:  11/23/18 (!) 149/71  09/02/18 (!) 160/89  08/10/18 (!) 156/89     2. Permanent atrial fibrillation Denies palpitations or heart racing  3. Bilateral carotid artery stenosis Last doppler was done in 2016 and was 59- 60% occluded bil. He says cardiologist had checked them out in June of last year but I do not see report. He says they were unchanged.  4. Acute diastolic heart failure ([place]) Has follow up appointment with cardiology late rthis week.  5. Chronic obstructive pulmonary disease, unspecified COPD type ([place]) No SOB. Denies cough. Only has problems when he is outside due to allergens  6. Mixed hyperlipidemia Watches fats in diet and exercises daily  7. Hypothyroidism due to acquired atrophy of thyroid No problems that aware of  8. Vitamin D deficiency Takes daily vitamin d supplement  9. BMI 26.0-26.9,adult Weight up 7lbs from last check..  10. Primary lung cancer, right ([place]) Had lobectomy and did not have to have chemo or radiation. Oncologist has released him from care.    Outpatient Encounter Medications as of 11/23/2018  Medication Sig  . alfuzosin (UROXATRAL) 10 MG 24 hr tablet Take 10 mg by mouth at bedtime.   Marland Kitchen amLODipine (NORVASC) 5 MG tablet Take 1 tablet (5 mg total) by mouth daily.  Marland Kitchen aspirin EC 81 MG tablet Take 1 tablet (81 mg total) by mouth daily.  Marland Kitchen atorvastatin (LIPITOR) 40 MG tablet Take 1 tablet (40 mg total) by mouth daily.  . cholecalciferol (VITAMIN D) 1000 UNITS tablet Take 1 tablet (1,000 Units total) by mouth daily.  . ferrous gluconate (FERGON) 324 MG tablet TAKE (1) TABLET TWICE A DAY WITH MEALS (BREAKFAST AND SUPPER)   . levothyroxine (SYNTHROID, LEVOTHROID) 75 MCG tablet Take 1 tablet (75 mcg total) by mouth daily.  Marland Kitchen lisinopril (PRINIVIL,ZESTRIL) 40 MG tablet Take 1 tablet (40 mg total) by mouth daily.  . metoprolol tartrate (LOPRESSOR) 25 MG tablet Take 0.5 tablets (12.5 mg total) by mouth 2 (two) times daily.  . Multiple Vitamin (ONE-A-DAY MENS PO) Take 1 tablet by mouth daily.    . naproxen (NAPROSYN) 500 MG tablet Take 1 tablet (500 mg total) by mouth 2 (two) times daily with a meal.  . Omega-3 Fatty Acids (FISH OIL) 1200 MG CAPS Take 2,400 mg by mouth daily.   . pantoprazole (PROTONIX) 40 MG tablet TAKE  (1)  TABLET TWICE A DAY.       New complaints: None today  Social history: Lives with wife and are both very active.    Review of Systems  Constitutional: Negative for activity change and appetite change.  HENT: Negative.   Eyes: Negative for pain.  Respiratory: Negative for shortness of breath.   Cardiovascular: Negative for chest pain, palpitations and leg swelling.  Gastrointestinal: Negative for abdominal pain.  Endocrine: Negative for polydipsia.  Genitourinary: Negative.   Skin: Negative for rash.  Neurological: Negative for dizziness, weakness and headaches.  Hematological: Does not bruise/bleed easily.  Psychiatric/Behavioral: Negative.   All other systems reviewed and are negative.      Objective:   Physical Exam Vitals signs and nursing note reviewed.  Constitutional:  Appearance: Normal appearance. He is well-developed.  HENT:     Head: Normocephalic.     Nose: Nose normal.  Eyes:     Pupils: Pupils are equal, round, and reactive to light.  Neck:     Musculoskeletal: Normal range of motion and neck supple.     Thyroid: No thyroid mass or thyromegaly.     Vascular: No carotid bruit or JVD.     Trachea: Phonation normal.  Cardiovascular:     Rate and Rhythm: Normal rate. Rhythm irregular.  Pulmonary:     Effort: Pulmonary effort is normal. No respiratory  distress.     Breath sounds: Normal breath sounds.  Abdominal:     General: Bowel sounds are normal.     Palpations: Abdomen is soft.     Tenderness: There is no abdominal tenderness.  Musculoskeletal: Normal range of motion.  Lymphadenopathy:     Cervical: No cervical adenopathy.  Skin:    General: Skin is warm and dry.  Neurological:     Mental Status: He is alert and oriented to person, place, and time.  Psychiatric:        Behavior: Behavior normal.        Thought Content: Thought content normal.        Judgment: Judgment normal.    BP (!) 145/69   Pulse 67   Temp 97.6 F (36.4 C) (Oral)   Ht 5' 10"  (1.778 m)   Wt 182 lb (82.6 kg)   BMI 26.11 kg/m         Assessment & Plan:  [name] [name] comes in today with chief complaint of Medical Management of Chronic Issues   Diagnosis and orders addressed:  1. Essential hypertension Low sodium diet - amLODipine (NORVASC) 5 MG tablet; Take 1 tablet (5 mg total) by mouth daily.  Dispense: 90 tablet; Refill: 1 - CMP14+EGFR  2. Permanent atrial fibrillation Keep follow up appointment with cardiology  3. Bilateral carotid artery stenosis Will recheck [place] next year  4. Acute diastolic heart failure ([place]) kep follow up appointment with cardiology - metoprolol tartrate (LOPRESSOR) 25 MG tablet; Take 0.5 tablets (12.5 mg total) by mouth 2 (two) times daily.  Dispense: 90 tablet; Refill: 1  5. Chronic obstructive pulmonary disease, unspecified COPD type ([place]) Avoid allergens  6. Mixed hyperlipidemia Low fat diet - lisinopril (ZESTRIL) 40 MG tablet; Take 1 tablet (40 mg total) by mouth daily.  Dispense: 90 tablet; Refill: 1 - atorvastatin (LIPITOR) 40 MG tablet; Take 1 tablet (40 mg total) by mouth daily.  Dispense: 90 tablet; Refill: 1 - Lipid panel  7. Hypothyroidism due to acquired atrophy of thyroid - levothyroxine (SYNTHROID) 75 MCG tablet; Take 1 tablet (75 mcg total) by mouth daily.  Dispense: 90 tablet;  Refill: 1 - Thyroid Panel With TSH  8. Vitamin D deficiency Continue vitamin d supplement  9. BMI 26.0-26.9,adult Discussed diet and exercise for person with BMI >25 Will recheck weight in 3-6 months  10. Primary lung cancer, right ([place])  11. Gastroesophageal reflux disease without esophagitis Avoid spicy foods Do not eat 2 hours prior to bedtime - pantoprazole (PROTONIX) 40 MG tablet; TAKE  (1)  TABLET TWICE A DAY.  Dispense: 180 tablet; Refill: 1   Labs pending Health Maintenance reviewed Diet and exercise encouraged  Follow up plan: 6 months   [name] Hassell Done, FNP

## 2018-11-24 LAB — CMP14+EGFR
ALT: 41 IU/L (ref 0–44)
AST: 45 IU/L — ABNORMAL HIGH (ref 0–40)
Albumin/Globulin Ratio: 1.3 (ref 1.2–2.2)
Albumin: 4.3 g/dL (ref 3.7–4.7)
Alkaline Phosphatase: 75 IU/L (ref 39–117)
BUN/Creatinine Ratio: 13 (ref 10–24)
BUN: 16 mg/dL (ref 8–27)
Bilirubin Total: 0.8 mg/dL (ref 0.0–1.2)
CO2: 23 mmol/L (ref 20–29)
Calcium: 9.5 mg/dL (ref 8.6–10.2)
Chloride: 102 mmol/L (ref 96–106)
Creatinine, Ser: 1.22 mg/dL (ref 0.76–1.27)
GFR calc Af Amer: 65 mL/min/{1.73_m2} (ref >59)
GFR calc non Af Amer: 56 mL/min/{1.73_m2} — ABNORMAL LOW (ref >59)
Globulin, Total: 3.4 g/dL (ref 1.5–4.5)
Glucose: 101 mg/dL — ABNORMAL HIGH (ref 65–99)
Potassium: 4.3 mmol/L (ref 3.5–5.2)
Sodium: 142 mmol/L (ref 134–144)
Total Protein: 7.7 g/dL (ref 6.0–8.5)

## 2018-11-24 LAB — LIPID PANEL
Chol/HDL Ratio: 3.9 ratio (ref 0.0–5.0)
Cholesterol, Total: 101 mg/dL (ref 100–199)
HDL: 26 mg/dL — ABNORMAL LOW (ref >39)
LDL Calculated: 47 mg/dL (ref 0–99)
Triglycerides: 139 mg/dL (ref 0–149)
VLDL Cholesterol Cal: 28 mg/dL (ref 5–40)

## 2018-11-24 LAB — THYROID PANEL WITH TSH
Free Thyroxine Index: 1.6 (ref 1.2–4.9)
T3 Uptake Ratio: 25 % (ref 24–39)
T4, Total: 6.2 ug/dL (ref 4.5–12.0)
TSH: 3.81 u[IU]/mL (ref 0.450–4.500)

## 2018-12-02 ENCOUNTER — Ambulatory Visit: Payer: Medicare Other | Admitting: Cardiovascular Disease

## 2018-12-17 ENCOUNTER — Other Ambulatory Visit: Payer: Self-pay | Admitting: Nurse Practitioner

## 2019-01-14 ENCOUNTER — Other Ambulatory Visit: Payer: Self-pay | Admitting: Nurse Practitioner

## 2019-02-25 ENCOUNTER — Telehealth: Payer: Self-pay | Admitting: Cardiovascular Disease

## 2019-02-25 NOTE — Telephone Encounter (Signed)
Virtual Visit Pre-Appointment Phone Call  "(Name), I am calling you today to discuss your upcoming appointment. We are currently trying to limit exposure to the virus that causes COVID-19 by seeing patients at home rather than in the office."  1. "What is the BEST phone number to call the day of the visit?" - [phone] 2.  3. Do you have or have access to (through a family member/friend) a smartphone with video capability that we can use for your visit?" a. If yes - list this number in appt notes as cell (if different from BEST phone #) and list the appointment type as a VIDEO visit in appointment notes b. If no - list the appointment type as a PHONE visit in appointment notes  4. Confirm consent - "In the setting of the current Covid19 crisis, you are scheduled for a (phone or video) visit with your provider on (date) at (time).  Just as we do with many in-office visits, in order for you to participate in this visit, we must obtain consent.  If you'd like, I can send this to your mychart (if signed up) or email for you to review.  Otherwise, I can obtain your verbal consent now.  All virtual visits are billed to your insurance company just like a normal visit would be.  By agreeing to a virtual visit, we'd like you to understand that the technology does not allow for your provider to perform an examination, and thus may limit your provider's ability to fully assess your condition. If your provider identifies any concerns that need to be evaluated in person, we will make arrangements to do so.  Finally, though the technology is pretty good, we cannot assure that it will always work on either your or our end, and in the setting of a video visit, we may have to convert it to a phone-only visit.  In either situation, we cannot ensure that we have a secure connection.  Are you willing to proceed?" STAFF: Did the patient verbally acknowledge consent to telehealth visit? Document YES/NO here:    YES 5.   6. Advise patient to be prepared - "Two hours prior to your appointment, go ahead and check your blood pressure, pulse, oxygen saturation, and your weight (if you have the equipment to check those) and write them all down. When your visit starts, your provider will ask you for this information. If you have an Apple Watch or Kardia device, please plan to have heart rate information ready on the day of your appointment. Please have a pen and paper handy nearby the day of the visit as well."  7. Give patient instructions for MyChart download to smartphone OR Doximity/Doxy.me as below if video visit (depending on what platform provider is using)  8. Inform patient they will receive a phone call 15 minutes prior to their appointment time (may be from unknown caller ID) so they should be prepared to answer    TELEPHONE CALL NOTE  [name] [name] has been deemed a candidate for a follow-up tele-health visit to limit community exposure during the Covid-19 pandemic. I spoke with the patient via phone to ensure availability of phone/video source, confirm preferred email & phone number, and discuss instructions and expectations.  I reminded [name] [name] to be prepared with any vital sign and/or heart rhythm information that could potentially be obtained via home monitoring, at the time of his visit. I reminded [name] [name] to expect a phone call prior  to his visit.  [name] [name] 02/25/2019 1:04 PM   INSTRUCTIONS FOR DOWNLOADING THE MYCHART APP TO SMARTPHONE  - The patient must first make sure to have activated MyChart and know their login information - If Apple, go to [employer] and type in MyChart in the search bar and download the app. If Android, ask patient to go to [employer] and type in [place] in the search bar and download the app. The app is free but as with any other app downloads, their phone may require them to verify saved payment information or Apple/Android  password.  - The patient will need to then log into the app with their MyChart username and password, and select [facility] as their healthcare provider to link the account. When it is time for your visit, go to the MyChart app, find appointments, and click Begin Video Visit. Be sure to Select Allow for your device to access the Microphone and Camera for your visit. You will then be connected, and your provider will be with you shortly.  **If they have any issues connecting, or need assistance please contact MyChart service desk [phone])**  **If using a computer, in order to ensure the best quality for their visit they will need to use either of the following Internet Browsers: [employer], or Google Chrome**  IF USING DOXIMITY or DOXY.ME - The patient will receive a link just prior to their visit by text.     FULL LENGTH CONSENT FOR TELE-HEALTH VISIT   I hereby voluntarily request, consent and authorize [facility] and its employed or contracted physicians, physician assistants, nurse practitioners or other licensed health care professionals (the Practitioner), to provide me with telemedicine health care services (the Services") as deemed necessary by the treating Practitioner. I acknowledge and consent to receive the Services by the Practitioner via telemedicine. I understand that the telemedicine visit will involve communicating with the Practitioner through live audiovisual communication technology and the disclosure of certain medical information by electronic transmission. I acknowledge that I have been given the opportunity to request an in-person assessment or other available alternative prior to the telemedicine visit and am voluntarily participating in the telemedicine visit.  I understand that I have the right to withhold or withdraw my consent to the use of telemedicine in the course of my care at any time, without affecting my right to future care or treatment,  and that the Practitioner or I may terminate the telemedicine visit at any time. I understand that I have the right to inspect all information obtained and/or recorded in the course of the telemedicine visit and may receive copies of available information for a reasonable fee.  I understand that some of the potential risks of receiving the Services via telemedicine include:   Delay or interruption in medical evaluation due to technological equipment failure or disruption;  Information transmitted may not be sufficient (e.g. poor resolution of images) to allow for appropriate medical decision making by the Practitioner; and/or   In rare instances, security protocols could fail, causing a breach of personal health information.  Furthermore, I acknowledge that it is my responsibility to provide information about my medical history, conditions and care that is complete and accurate to the best of my ability. I acknowledge that Practitioner's advice, recommendations, and/or decision may be based on factors not within their control, such as incomplete or inaccurate data provided by me or distortions of diagnostic images or specimens that may result from electronic transmissions.  I understand that the practice of medicine is not an exact science and that Practitioner makes no warranties or guarantees regarding treatment outcomes. I acknowledge that I will receive a copy of this consent concurrently upon execution via email to the email address I last provided but may also request a printed copy by calling the office of [facility].    I understand that my insurance will be billed for this visit.   I have read or had this consent read to me.  I understand the contents of this consent, which adequately explains the benefits and risks of the Services being provided via telemedicine.   I have been provided ample opportunity to ask questions regarding this consent and the Services and have had my questions  answered to my satisfaction.  I give my informed consent for the services to be provided through the use of telemedicine in my medical care  By participating in this telemedicine visit I agree to the above.

## 2019-03-02 ENCOUNTER — Encounter: Payer: Self-pay | Admitting: Cardiovascular Disease

## 2019-03-02 ENCOUNTER — Telehealth (INDEPENDENT_AMBULATORY_CARE_PROVIDER_SITE_OTHER): Payer: Medicare Other | Admitting: Cardiovascular Disease

## 2019-03-02 VITALS — BP 146/77 | HR 65 | Ht 70.0 in | Wt 174.0 lb

## 2019-03-02 DIAGNOSIS — I5032 Chronic diastolic (congestive) heart failure: Secondary | ICD-10-CM

## 2019-03-02 DIAGNOSIS — E785 Hyperlipidemia, unspecified: Secondary | ICD-10-CM

## 2019-03-02 DIAGNOSIS — Z952 Presence of prosthetic heart valve: Secondary | ICD-10-CM

## 2019-03-02 DIAGNOSIS — I2510 Atherosclerotic heart disease of native coronary artery without angina pectoris: Secondary | ICD-10-CM

## 2019-03-02 DIAGNOSIS — I4821 Permanent atrial fibrillation: Secondary | ICD-10-CM | POA: Diagnosis not present

## 2019-03-02 DIAGNOSIS — I739 Peripheral vascular disease, unspecified: Secondary | ICD-10-CM

## 2019-03-02 DIAGNOSIS — I10 Essential (primary) hypertension: Secondary | ICD-10-CM | POA: Diagnosis not present

## 2019-03-02 MED ORDER — AMLODIPINE BESYLATE 10 MG PO TABS: 10.0000 mg | ORAL_TABLET | Freq: Every day | ORAL | 6 refills | Status: DC

## 2019-03-02 NOTE — Patient Instructions (Signed)
Medication Instructions:   Increase Amlodipine to 10mg  daily.   New 10mg  tablet sent to pharmacy today.   Continue all other medications.    Labwork: none  Testing/Procedures: none  Follow-Up: Your physician wants you to follow up in: 6 months.  You will receive a reminder letter in the mail one-two months in advance.  If you don't receive a letter, please call our office to schedule the follow up appointment   Any Other Special Instructions Will Be Listed Below (If Applicable).  If you need a refill on your cardiac medications before your next appointment, please call your pharmacy.

## 2019-03-02 NOTE — Addendum Note (Signed)
Addended by: Laurine Blazer on: 03/02/2019 08:49 AM   Modules accepted: Orders

## 2019-03-02 NOTE — Progress Notes (Signed)
Virtual Visit via Telephone Note   This visit type was conducted due to national recommendations for restrictions regarding the COVID-19 Pandemic (e.g. social distancing) in an effort to limit this patient's exposure and mitigate transmission in our community.  Due to his co-morbid illnesses, this patient is at least at moderate risk for complications without adequate follow up.  This format is felt to be most appropriate for this patient at this time.  The patient did not have access to video technology/had technical difficulties with video requiring transitioning to audio format only (telephone).  All issues noted in this document were discussed and addressed.  No physical exam could be performed with this format.  Please refer to the patient's chart for his  consent to telehealth for [facility].   Date:  03/02/2019   ID:  [name] [name], DOB 19-Jul-1940, MRN [id number]  Patient Location: Home Provider Location: Office  PCP:  [name], FNP  Cardiologist:  Kate Sable, MD  Electrophysiologist:  None   Evaluation Performed:  Follow-Up Visit  Chief Complaint:  CAD, TAVR  History of Present Illness:    [name] [name] is a 78 y.o. 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  Coronary angiography on 01/14/17 showed 60% proximal LAD stenosis with mild nonobstructive disease in the circumflex and RCA. It was decided to manage this medically. He also has a history of COPD, abdominal aortic aneurysm status post stent graft repair in 2011, and atrial fibrillation.  He developed GI bleeding on Eliquis and this was discontinued.  He and his wife have been married for over 32 years.  He denies chest pain.  He only gets short of breath if he overexerts himself while doing yard work.  He has occasional dizziness when he stands up.  He also has sinus trouble related to seasonal allergies.  He has some mild chronic bilateral leg swelling which has not gotten any  worse.  He checks his blood pressure and systolic readings are generally elevated over the 140 range.  The patient does not have symptoms concerning for COVID-19 infection (fever, chills, cough).    Past Medical History:  Diagnosis Date  . AAA (abdominal aortic aneurysm) ([place])   . Aortic stenosis, severe    a. 02/2017: s/p TAVR with Edwards Sapien 3 THV (size 26 mm, model # 9600TFX, serial # [id number])  . Arthritis   . Atherosclerotic peripheral vascular disease ([place])   . Atrial fibrillation, persistent    a. not a candidate for [place] given hx of GI bleed  . Bilateral carotid artery stenosis   . Bladder cancer ([place])   . COPD (chronic obstructive pulmonary disease) ([place])   . Hyperlipidemia   . Hypertension   . Hypothyroidism   . Lung cancer ([place])    Right  . Lung mass    Right  . Skin cancer   . Vitamin D deficiency    Past Surgical History:  Procedure Laterality Date  . ABDOMINAL AORTIC ANEURYSM REPAIR  08-09-09  . BACK SURGERY    . CYSTOSCOPY W/ RETROGRADES Bilateral 09/02/2018   Procedure: CYSTOSCOPY WITH RETROGRADE PYELOGRAM;  Surgeon: Franchot Gallo, MD;  Location: [facility];  Service: Urology;  Laterality: Bilateral;  . CYSTOSCOPY WITH BIOPSY N/A 09/02/2018   Procedure: CYSTOSCOPY WITH BIOPSY;  Surgeon: Franchot Gallo, MD;  Location: [facility];  Service: Urology;  Laterality: N/A;  . ESOPHAGOGASTRODUODENOSCOPY N/A 02/21/2017   Procedure: ESOPHAGOGASTRODUODENOSCOPY (EGD);  Surgeon: Jerene Bears, MD;  [facility];  Service: Gastroenterology;  Laterality: N/A;  . HYDROCELE EXCISION / REPAIR    . LOBECTOMY Right 03/30/2017   Procedure: RIGHT UPPER LOBECTOMY LUNG;  Surgeon: Melrose Nakayama, MD;  Location: [facility];  Service: Thoracic;  Laterality: Right;  . SKIN CANCER EXCISION    . SPINE SURGERY    . TEE WITHOUT CARDIOVERSION N/A 02/24/2017   Procedure: TRANSESOPHAGEAL ECHOCARDIOGRAM (TEE);  Surgeon: Sherren Mocha, MD;   Location: [facility];  Service: Open Heart Surgery;  Laterality: N/A;  . TRANSCATHETER AORTIC VALVE REPLACEMENT, TRANSFEMORAL N/A 02/24/2017   Procedure: TRANSCATHETER AORTIC VALVE REPLACEMENT, TRANSFEMORAL using a 47mm Edwards Sapien 3 Aortic Valve;  Surgeon: Sherren Mocha, MD;  Location: [facility];  Service: Open Heart Surgery;  Laterality: N/A;  . TRANSURETHRAL RESECTION OF BLADDER TUMOR N/A 06/06/2016   Procedure: TRANSURETHRAL RESECTION OF BLADDER TUMOR (TURBT);  Surgeon: Franchot Gallo, MD;  Location: [facility];  Service: Urology;  Laterality: N/A;  . TRANSURETHRAL RESECTION OF BLADDER TUMOR N/A 07/22/2016   Procedure: TRANSURETHRAL RESECTION OF BLADDER TUMOR (TURBT);  Surgeon: Franchot Gallo, MD;  Location: [facility];  Service: Urology;  Laterality: N/A;  . VIDEO ASSISTED THORACOSCOPY (VATS)/WEDGE RESECTION Right 03/30/2017   Procedure: VIDEO ASSISTED THORACOSCOPY (VATS)/WEDGE RESECTION;  Surgeon: Melrose Nakayama, MD;  Location: [facility];  Service: Thoracic;  Laterality: Right;     Current Meds  Medication Sig  . amLODipine (NORVASC) 5 MG tablet Take 1 tablet (5 mg total) by mouth daily.  Marland Kitchen aspirin EC 81 MG tablet Take 1 tablet (81 mg total) by mouth daily.  Marland Kitchen atorvastatin (LIPITOR) 40 MG tablet Take 1 tablet (40 mg total) by mouth daily.  . cholecalciferol (VITAMIN D) 1000 UNITS tablet Take 1 tablet (1,000 Units total) by mouth daily.  . ferrous gluconate (FERGON) 324 MG tablet TAKE (1) TABLET TWICE A DAY WITH MEALS (BREAKFAST AND SUPPER)  . levothyroxine (SYNTHROID) 75 MCG tablet Take 1 tablet (75 mcg total) by mouth daily.  Marland Kitchen lisinopril (ZESTRIL) 40 MG tablet Take 1 tablet (40 mg total) by mouth daily.  . metoprolol tartrate (LOPRESSOR) 25 MG tablet Take 0.5 tablets (12.5 mg total) by mouth 2 (two) times daily.  . Multiple Vitamin (ONE-A-DAY MENS PO) Take 1 tablet by mouth daily.    . naproxen (NAPROSYN) 500 MG tablet Take 1 tablet (500 mg total) by mouth 2 (two) times daily with a meal.  .  Omega-3 Fatty Acids (FISH OIL) 1200 MG CAPS Take 2,400 mg by mouth daily.   . pantoprazole (PROTONIX) 40 MG tablet TAKE  (1)  TABLET TWICE A DAY.     Allergies:   Eliquis [apixaban] and Lovenox [enoxaparin]   Social History   Tobacco Use  . Smoking status: Former Smoker    Packs/day: 1.00    Years: 51.00    Pack years: 51.00    Types: Cigarettes    Start date: 11/25/1954    Quit date: 10/27/2005    Years since quitting: 13.3  . Smokeless tobacco: Never Used  Substance Use Topics  . Alcohol use: No    Alcohol/week: 0.0 standard drinks    Comment: none since 1999  . Drug use: No     Family Hx: The patient's family history includes Alcohol abuse in his father; COPD in his mother and sister; Cancer in his mother; Gout in his brother and brother; Heart attack in his father and son; Heart disease in his father; Hypertension in his brother; Lupus in his sister; Prostatitis in his  brother.  ROS:   Please see the history of present illness.     All other systems reviewed and are negative.   Prior CV studies:   The following studies were reviewed today:  Echocardiogram 02/25/18:  - Left ventricle: The cavity size was normal. Wall thickness was   normal. Systolic function was normal. The estimated ejection   fraction was in the range of 55% to 60%. Wall motion was normal;   there were no regional wall motion abnormalities. There was no   evidence of elevated ventricular filling pressure by Doppler   parameters. - Aortic valve: A 26mm Edwards Sapienn 3 TAVR bioprosthesis was   present There was trivial perivalvular regurgitation. Peak   velocity (S): 220 cm/s. Mean gradient (S): 9 mm Hg. Peak gradient   (S): 19 mm Hg. - Left atrium: The atrium was mildly dilated. Volume/bsa, ES   (1-plane Simpson&'s, A4C): 40 ml/m^2. - Right ventricle: The cavity size was mildly dilated. Wall   thickness was normal. - Right atrium: The atrium was mildly dilated. - Tricuspid valve: There was  trivial regurgitation. - Pulmonary arteries: Systolic pressure was mildly increased. PA   peak pressure: 32 mm Hg (S).  Labs/Other Tests and Data Reviewed:    EKG:  No ECG reviewed.  Recent Labs: 09/13/2018: Hemoglobin 13.2; Platelet Count 184 11/23/2018: ALT 41; BUN 16; Creatinine, Ser 1.22; Potassium 4.3; Sodium 142; TSH 3.810   Recent Lipid Panel Lab Results  Component Value Date/Time   CHOL 101 11/23/2018 08:35 AM   CHOL 118 01/14/2013 11:57 AM   TRIG 139 11/23/2018 08:35 AM   TRIG 160 (H) 04/13/2014 08:46 AM   TRIG 178 (H) 01/14/2013 11:57 AM   HDL 26 (L) 11/23/2018 08:35 AM   HDL 32 (L) 04/13/2014 08:46 AM   HDL 30 (L) 01/14/2013 11:57 AM   CHOLHDL 3.9 11/23/2018 08:35 AM   LDLCALC 47 11/23/2018 08:35 AM   LDLCALC 61 04/13/2014 08:46 AM   LDLCALC 52 01/14/2013 11:57 AM    Wt Readings from Last 3 Encounters:  03/02/19 174 lb (78.9 kg)  11/23/18 182 lb (82.6 kg)  09/02/18 176 lb 6.4 oz (80 kg)     Objective:    Vital Signs:  BP (!) 146/77   Pulse 65   Ht 5\' 10"  (1.778 m)   Wt 174 lb (78.9 kg)   BMI 24.97 kg/m    VITAL SIGNS:  reviewed  ASSESSMENT & PLAN:    1. Severe aortic stenosis s/p TAVR in 02/2017:Symptomatically stable. Normal prosthetic valve function as noted above by most recent echocardiogram in October 2018.Continue aspirin.  2. Chronic diastolic heart failure:Euvolemic and stable. No change to therapy.  3.Permanentatrial fibrillation:Symptomatically stable on metoprolol. Currently not an anticoagulation candidate given history of GI bleeding.Only on aspirin.   4.Coronary artery disease: Symptomatically stable. Continue aspirin, Lipitor, and metoprolol.  5. Hyperlipidemia: Continue Lipitor. Lipid panel from 11/23/2018 reviewed.  LDL 47.  6. Chronic hypertension: Blood pressure is mildly elevated. I will increase amlodipine to 10 mg.  7. Peripheral vascular disease: Followed by vascular surgery.Continue aspirin and statin.    COVID-19 Education: The signs and symptoms of COVID-19 were discussed with the patient and how to seek care for testing (follow up with PCP or arrange E-visit).  The importance of social distancing was discussed today.  Time:   Today, I have spent 10 minutes with the patient with telehealth technology discussing the above problems.     Medication Adjustments/Labs and Tests Ordered: Current medicines  are reviewed at length with the patient today.  Concerns regarding medicines are outlined above.   Tests Ordered: No orders of the defined types were placed in this encounter.   Medication Changes: No orders of the defined types were placed in this encounter.   Follow Up:  Virtual Visit or In Person in 6 month(s)  Signed, Kate Sable, MD  03/02/2019 8:29 AM    [place]

## 2019-03-18 ENCOUNTER — Other Ambulatory Visit: Payer: Self-pay

## 2019-03-18 ENCOUNTER — Inpatient Hospital Stay: Payer: Medicare Other | Attending: Internal Medicine

## 2019-03-18 ENCOUNTER — Ambulatory Visit (HOSPITAL_COMMUNITY)
Admission: RE | Admit: 2019-03-18 | Discharge: 2019-03-18 | Disposition: A | Discharge status: Home / Self Care | Payer: Medicare Other | Source: Ambulatory Visit | Attending: Internal Medicine | Admitting: Internal Medicine

## 2019-03-18 ENCOUNTER — Telehealth: Payer: Self-pay | Admitting: *Deleted

## 2019-03-18 DIAGNOSIS — J439 Emphysema, unspecified: Secondary | ICD-10-CM | POA: Diagnosis not present

## 2019-03-18 DIAGNOSIS — Z85828 Personal history of other malignant neoplasm of skin: Secondary | ICD-10-CM | POA: Insufficient documentation

## 2019-03-18 DIAGNOSIS — I4819 Other persistent atrial fibrillation: Secondary | ICD-10-CM | POA: Diagnosis not present

## 2019-03-18 DIAGNOSIS — E039 Hypothyroidism, unspecified: Secondary | ICD-10-CM | POA: Insufficient documentation

## 2019-03-18 DIAGNOSIS — Z791 Long term (current) use of non-steroidal anti-inflammatories (NSAID): Secondary | ICD-10-CM | POA: Insufficient documentation

## 2019-03-18 DIAGNOSIS — Z7982 Long term (current) use of aspirin: Secondary | ICD-10-CM | POA: Diagnosis not present

## 2019-03-18 DIAGNOSIS — Z79899 Other long term (current) drug therapy: Secondary | ICD-10-CM | POA: Insufficient documentation

## 2019-03-18 DIAGNOSIS — C3490 Malignant neoplasm of unspecified part of unspecified bronchus or lung: Secondary | ICD-10-CM | POA: Insufficient documentation

## 2019-03-18 DIAGNOSIS — I10 Essential (primary) hypertension: Secondary | ICD-10-CM | POA: Insufficient documentation

## 2019-03-18 DIAGNOSIS — Z902 Acquired absence of lung [part of]: Secondary | ICD-10-CM | POA: Diagnosis not present

## 2019-03-18 DIAGNOSIS — Z8551 Personal history of malignant neoplasm of bladder: Secondary | ICD-10-CM | POA: Insufficient documentation

## 2019-03-18 DIAGNOSIS — C3411 Malignant neoplasm of upper lobe, right bronchus or lung: Secondary | ICD-10-CM | POA: Insufficient documentation

## 2019-03-18 DIAGNOSIS — E785 Hyperlipidemia, unspecified: Secondary | ICD-10-CM | POA: Diagnosis not present

## 2019-03-18 LAB — CMP (CANCER CENTER ONLY)
ALT: 31 U/L (ref 0–44)
AST: 38 U/L (ref 15–41)
Albumin: 4.5 g/dL (ref 3.5–5.0)
Alkaline Phosphatase: 76 U/L (ref 38–126)
Anion gap: 10 (ref 5–15)
BUN: 15 mg/dL (ref 8–23)
CO2: 26 mmol/L (ref 22–32)
Calcium: 9.4 mg/dL (ref 8.9–10.3)
Chloride: 105 mmol/L (ref 98–111)
Creatinine: 1.09 mg/dL (ref 0.61–1.24)
GFR, Est AFR Am: >60 mL/min (ref >60)
GFR, Estimated: >60 mL/min (ref >60)
Glucose, Bld: 105 mg/dL — ABNORMAL HIGH (ref 70–99)
Potassium: 4.3 mmol/L (ref 3.5–5.1)
Sodium: 141 mmol/L (ref 135–145)
Total Bilirubin: 1 mg/dL (ref 0.3–1.2)
Total Protein: 8.3 g/dL — ABNORMAL HIGH (ref 6.5–8.1)

## 2019-03-18 LAB — CBC WITH DIFFERENTIAL (CANCER CENTER ONLY)
Abs Immature Granulocytes: 0.01 10*3/uL (ref 0.00–0.07)
Basophils Absolute: 0 10*3/uL (ref 0.0–0.1)
Basophils Relative: 1 %
Eosinophils Absolute: 0.2 10*3/uL (ref 0.0–0.5)
Eosinophils Relative: 3 %
HCT: 41.4 % (ref 39.0–52.0)
Hemoglobin: 13.9 g/dL (ref 13.0–17.0)
Immature Granulocytes: 0 %
Lymphocytes Relative: 25 %
Lymphs Abs: 1.6 10*3/uL (ref 0.7–4.0)
MCH: 31.3 pg (ref 26.0–34.0)
MCHC: 33.6 g/dL (ref 30.0–36.0)
MCV: 93.2 fL (ref 80.0–100.0)
Monocytes Absolute: 0.5 10*3/uL (ref 0.1–1.0)
Monocytes Relative: 7 %
Neutro Abs: 4.1 10*3/uL (ref 1.7–7.7)
Neutrophils Relative %: 64 %
Platelet Count: 157 10*3/uL (ref 150–400)
RBC: 4.44 MIL/uL (ref 4.22–5.81)
RDW: 12.9 % (ref 11.5–15.5)
WBC Count: 6.4 10*3/uL (ref 4.0–10.5)
nRBC: 0 % (ref 0.0–0.2)

## 2019-03-18 MED ORDER — IOHEXOL 300 MG/ML  SOLN
75.0000 mL | Freq: Once | INTRAMUSCULAR | Status: AC | PRN
  Administered 2019-03-18: 75 mL via INTRAVENOUS

## 2019-03-18 MED ORDER — SODIUM CHLORIDE (PF) 0.9 % IJ SOLN
INTRAMUSCULAR | Status: AC
  Filled 2019-03-18: qty 50

## 2019-03-18 NOTE — Telephone Encounter (Signed)
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  Dr Julien Nordmann notified.  Attempted to call patient- no answer.  Pt sees Dr Julien Nordmann on Monday for 1 year follow up.

## 2019-03-19 ENCOUNTER — Other Ambulatory Visit: Payer: Self-pay | Admitting: Nurse Practitioner

## 2019-03-21 ENCOUNTER — Telehealth: Payer: Self-pay | Admitting: *Deleted

## 2019-03-21 ENCOUNTER — Other Ambulatory Visit: Payer: Self-pay

## 2019-03-21 ENCOUNTER — Inpatient Hospital Stay: Payer: Medicare Other | Admitting: Internal Medicine

## 2019-03-21 ENCOUNTER — Telehealth: Payer: Self-pay | Admitting: Cardiovascular Disease

## 2019-03-21 ENCOUNTER — Encounter: Payer: Self-pay | Admitting: Internal Medicine

## 2019-03-21 VITALS — BP 141/68 | HR 78 | Temp 97.8°F | Resp 18 | Ht 70.0 in | Wt 176.3 lb

## 2019-03-21 DIAGNOSIS — Z902 Acquired absence of lung [part of]: Secondary | ICD-10-CM | POA: Diagnosis not present

## 2019-03-21 DIAGNOSIS — E785 Hyperlipidemia, unspecified: Secondary | ICD-10-CM | POA: Diagnosis not present

## 2019-03-21 DIAGNOSIS — C3491 Malignant neoplasm of unspecified part of right bronchus or lung: Secondary | ICD-10-CM

## 2019-03-21 DIAGNOSIS — C3411 Malignant neoplasm of upper lobe, right bronchus or lung: Secondary | ICD-10-CM | POA: Diagnosis not present

## 2019-03-21 DIAGNOSIS — I4819 Other persistent atrial fibrillation: Secondary | ICD-10-CM | POA: Diagnosis not present

## 2019-03-21 DIAGNOSIS — Z79899 Other long term (current) drug therapy: Secondary | ICD-10-CM | POA: Diagnosis not present

## 2019-03-21 DIAGNOSIS — Z791 Long term (current) use of non-steroidal anti-inflammatories (NSAID): Secondary | ICD-10-CM | POA: Diagnosis not present

## 2019-03-21 DIAGNOSIS — C3490 Malignant neoplasm of unspecified part of unspecified bronchus or lung: Secondary | ICD-10-CM

## 2019-03-21 DIAGNOSIS — Z8551 Personal history of malignant neoplasm of bladder: Secondary | ICD-10-CM | POA: Diagnosis not present

## 2019-03-21 DIAGNOSIS — Z85828 Personal history of other malignant neoplasm of skin: Secondary | ICD-10-CM | POA: Diagnosis not present

## 2019-03-21 DIAGNOSIS — I10 Essential (primary) hypertension: Secondary | ICD-10-CM

## 2019-03-21 DIAGNOSIS — I4821 Permanent atrial fibrillation: Secondary | ICD-10-CM

## 2019-03-21 DIAGNOSIS — Z7982 Long term (current) use of aspirin: Secondary | ICD-10-CM | POA: Diagnosis not present

## 2019-03-21 DIAGNOSIS — E039 Hypothyroidism, unspecified: Secondary | ICD-10-CM | POA: Diagnosis not present

## 2019-03-21 NOTE — Progress Notes (Signed)
[name] [name] [phone]   [phone]) [phone]  OFFICE PROGRESS NOTE  [name], [place] [address]  DIAGNOSIS: Stage IA (T1a, N0, M0) non-small cell lung cancer, squamous cell carcinoma presented with right upper lobe lung nodule   PRIOR THERAPY: status post right upper lobectomy with lymph node dissection on March 30, 2017 under the care of Dr. Roxan Hockey.  CURRENT THERAPY: Observation.  INTERVAL HISTORY: [name] [name] 78 y.o. male returns to the clinic today for 6 months follow-up visit.  The patient is feeling fine today with no concerning complaints.  He has a history of atrial fibrillation and he was on treatment with Eliquis and Plavix in the past but this was discontinued.  He denied having any chest pain, shortness of breath, cough or hemoptysis.  He denied having any fever or chills.  He has no nausea, vomiting, diarrhea or constipation.  He has no neurological symptoms.  He had repeat CT scan of the chest performed recently and he is here for evaluation and discussion of his scan results.   MEDICAL HISTORY: Past Medical History:  Diagnosis Date  . AAA (abdominal aortic aneurysm) ([place])   . Aortic stenosis, severe    a. 02/2017: s/p TAVR with Edwards Sapien 3 THV (size 26 mm, model # [id number], serial # [id number])  . Arthritis   . Atherosclerotic peripheral vascular disease ([place])   . Atrial fibrillation, persistent    a. not a candidate for [name] given hx of GI bleed  . Bilateral carotid artery stenosis   . Bladder cancer ([place])   . COPD (chronic obstructive pulmonary disease) ([place])   . Hyperlipidemia   . Hypertension   . Hypothyroidism   . Lung cancer ([place])    Right  . Lung mass    Right  . Skin cancer   . Vitamin D deficiency     ALLERGIES:  is allergic to eliquis [apixaban] and lovenox [enoxaparin].  MEDICATIONS:  Current Outpatient Medications  Medication Sig Dispense Refill  . amLODipine  (NORVASC) 10 MG tablet Take 1 tablet (10 mg total) by mouth daily. 30 tablet 6  . aspirin EC 81 MG tablet Take 1 tablet (81 mg total) by mouth daily. 90 tablet 3  . atorvastatin (LIPITOR) 40 MG tablet Take 1 tablet (40 mg total) by mouth daily. 90 tablet 1  . cholecalciferol (VITAMIN D) 1000 UNITS tablet Take 1 tablet (1,000 Units total) by mouth daily. 30 tablet 6  . ferrous gluconate (FERGON) 324 MG tablet TAKE (1) TABLET TWICE A DAY WITH MEALS (BREAKFAST AND SUPPER) 60 tablet 1  . levothyroxine (SYNTHROID) 75 MCG tablet Take 1 tablet (75 mcg total) by mouth daily. 90 tablet 1  . lisinopril (ZESTRIL) 40 MG tablet Take 1 tablet (40 mg total) by mouth daily. 90 tablet 1  . metoprolol tartrate (LOPRESSOR) 25 MG tablet Take 0.5 tablets (12.5 mg total) by mouth 2 (two) times daily. 90 tablet 1  . Multiple Vitamin (ONE-A-DAY MENS PO) Take 1 tablet by mouth daily.      . naproxen (NAPROSYN) 500 MG tablet Take 1 tablet (500 mg total) by mouth 2 (two) times daily with a meal. 30 tablet 1  . Omega-3 Fatty Acids (FISH OIL) 1200 MG CAPS Take 2,400 mg by mouth daily.     . pantoprazole (PROTONIX) 40 MG tablet TAKE  (1)  TABLET TWICE A DAY. 180 tablet 1   No current facility-administered medications for this visit.  SURGICAL HISTORY:  Past Surgical History:  Procedure Laterality Date  . ABDOMINAL AORTIC ANEURYSM REPAIR  08-09-09  . BACK SURGERY    . CYSTOSCOPY W/ RETROGRADES Bilateral 09/02/2018   Procedure: CYSTOSCOPY WITH RETROGRADE PYELOGRAM;  Surgeon: Franchot Gallo, MD;  Location: [facility];  Service: Urology;  Laterality: Bilateral;  . CYSTOSCOPY WITH BIOPSY N/A 09/02/2018   Procedure: CYSTOSCOPY WITH BIOPSY;  Surgeon: Franchot Gallo, MD;  Location: [facility];  Service: Urology;  Laterality: N/A;  . ESOPHAGOGASTRODUODENOSCOPY N/A 02/21/2017   Procedure: ESOPHAGOGASTRODUODENOSCOPY (EGD);  Surgeon: Jerene Bears, MD;  Location: [facility] ENDOSCOPY;  Service:  Gastroenterology;  Laterality: N/A;  . HYDROCELE EXCISION / REPAIR    . LOBECTOMY Right 03/30/2017   Procedure: RIGHT UPPER LOBECTOMY LUNG;  Surgeon: Melrose Nakayama, MD;  Location: [facility];  Service: Thoracic;  Laterality: Right;  . SKIN CANCER EXCISION    . SPINE SURGERY    . TEE WITHOUT CARDIOVERSION N/A 02/24/2017   Procedure: TRANSESOPHAGEAL ECHOCARDIOGRAM (TEE);  Surgeon: Sherren Mocha, MD;  Location: [facility];  Service: Open Heart Surgery;  Laterality: N/A;  . TRANSCATHETER AORTIC VALVE REPLACEMENT, TRANSFEMORAL N/A 02/24/2017   Procedure: TRANSCATHETER AORTIC VALVE REPLACEMENT, TRANSFEMORAL using a 64mm Edwards Sapien 3 Aortic Valve;  Surgeon: Sherren Mocha, MD;  Location: [facility];  Service: Open Heart Surgery;  Laterality: N/A;  . TRANSURETHRAL RESECTION OF BLADDER TUMOR N/A 06/06/2016   Procedure: TRANSURETHRAL RESECTION OF BLADDER TUMOR (TURBT);  Surgeon: Franchot Gallo, MD;  Location: [facility];  Service: Urology;  Laterality: N/A;  . TRANSURETHRAL RESECTION OF BLADDER TUMOR N/A 07/22/2016   Procedure: TRANSURETHRAL RESECTION OF BLADDER TUMOR (TURBT);  Surgeon: Franchot Gallo, MD;  Location: [facility];  Service: Urology;  Laterality: N/A;  . VIDEO ASSISTED THORACOSCOPY (VATS)/WEDGE RESECTION Right 03/30/2017   Procedure: VIDEO ASSISTED THORACOSCOPY (VATS)/WEDGE RESECTION;  Surgeon: Melrose Nakayama, MD;  Location: [facility];  Service: Thoracic;  Laterality: Right;    REVIEW OF SYSTEMS:  A comprehensive review of systems was negative.   PHYSICAL EXAMINATION: General appearance: alert, cooperative and no distress Head: Normocephalic, without obvious abnormality, atraumatic Neck: no adenopathy, no JVD, supple, symmetrical, trachea midline and thyroid not enlarged, symmetric, no tenderness/mass/nodules Lymph nodes: Cervical, supraclavicular, and axillary nodes normal. Resp: clear to auscultation bilaterally Back: symmetric, no curvature. ROM normal. No CVA tenderness. Cardio: regular  rate and rhythm, S1, S2 normal, no murmur, click, rub or gallop GI: soft, non-tender; bowel sounds normal; no masses,  no organomegaly Extremities: extremities normal, atraumatic, no cyanosis or edema  ECOG PERFORMANCE STATUS: 1 - Symptomatic but completely ambulatory  Blood pressure (!) 141/68, pulse 78, temperature 97.8 F (36.6 C), temperature source Oral, resp. rate 18, height 5\' 10"  (1.778 m), weight 176 lb 4.8 oz (80 kg), SpO2 98 %.  LABORATORY DATA: Lab Results  Component Value Date   WBC 6.4 03/18/2019   HGB 13.9 03/18/2019   HCT 41.4 03/18/2019   MCV 93.2 03/18/2019   PLT 157 03/18/2019      Chemistry      Component Value Date/Time   NA 141 03/18/2019 1002   NA 142 11/23/2018 0835   NA 140 05/07/2017 1419   K 4.3 03/18/2019 1002   K 4.2 05/07/2017 1419   CL 105 03/18/2019 1002   CO2 26 03/18/2019 1002   CO2 25 05/07/2017 1419   BUN 15 03/18/2019 1002   BUN 16 11/23/2018 0835   BUN 14.5 05/07/2017 1419   CREATININE 1.09 03/18/2019 1002  CREATININE 1.0 05/07/2017 1419      Component Value Date/Time   CALCIUM 9.4 03/18/2019 1002   CALCIUM 9.6 05/07/2017 1419   ALKPHOS 76 03/18/2019 1002   ALKPHOS 85 05/07/2017 1419   AST 38 03/18/2019 1002   AST 23 05/07/2017 1419   ALT 31 03/18/2019 1002   ALT 14 05/07/2017 1419   BILITOT 1.0 03/18/2019 1002   BILITOT 0.48 05/07/2017 1419       RADIOGRAPHIC STUDIES: Ct Chest W Contrast  Result Date: 03/18/2019 CLINICAL DATA:  Non-small cell lung cancer, right lung cancer diagnosed in 2018 history of right upper lobectomy. Also with history of bladder cancer and aortic valve replacement. EXAM: CT CHEST WITH CONTRAST TECHNIQUE: Multidetector CT imaging of the chest was performed during intravenous contrast administration. CONTRAST:  83mL OMNIPAQUE IOHEXOL 300 MG/ML  SOLN COMPARISON:  11/13/2018 FINDINGS: Cardiovascular: Signs of moderate calcific and noncalcific atherosclerosis throughout the thoracic aorta. Signs of trans  arterial aortic valve replacement. Calcified coronary artery disease. Heart size stable without signs of pericardial effusion. Biatrial dilation as before. Hypodense area in the superior most aspect of the left atrial appendage quite similar in some respects to study of 02/28/2017. Mediastinum/Nodes: Scattered small mediastinal lymph nodes, in the high anterior mediastinum an 8 mm lymph node (image 39, series 2) previously 10 mm. Other small lymph nodes are unchanged. No signs of hilar lymphadenopathy. Lungs/Pleura: Post right upper lobectomy. Pleural and parenchymal scarring in the right chest is unchanged. Background pulmonary emphysema as before. Upper Abdomen: Signs of hepatic steatosis. Fatty sparing about the gallbladder fossa. Spleen is normal with respect imaged portions. Adrenal glands are unremarkable. Upper abdominal contents are otherwise unremarkable. Musculoskeletal: Post procedural changes related to prior right thoracotomy for right upper lobe resection no signs of acute bone finding or evidence of destructive bone process. IMPRESSION: 1. Question small left atrial appendage thrombus. Seen in a patient with history of atrial fibrillation and biatrial enlargement. Transesophageal echocardiography could be considered as clinically indicated for further assessment as this could place the patient at risk for systemic emboli. Similar findings were question on the study of 01/29/2017. 2. Post right upper lobectomy with with stable appearance of pleuroparenchymal scarring in the right chest, no signs of recurrent disease. 3. Emphysema as before. 4. Atherosclerosis with signs of trans arterial aortic valve replacement. These results will be called to the ordering clinician or representative by the Radiologist Assistant, and communication documented in the PACS or zVision Dashboard. Aortic Atherosclerosis (ICD10-I70.0) and Emphysema (ICD10-J43.9). Electronically Signed   By: Zetta Bills M.D.   On:  03/18/2019 13:44    ASSESSMENT AND PLAN: This is a very pleasant 78 years old white male with a stage IA non-small cell lung cancer status post right upper lobectomy with lymph node dissection in October 2018. The patient is currently on observation and he is feeling fine with no concerning complaints. Repeat CT scan of the chest showed no concerning findings for disease recurrence but it showed the development of small left atrial appendage thrombus and in the setting of atrial fibrillation discovered place the patient at risk for stroke. I discussed the scan results with the patient and recommended for him to see his cardiologist as soon as possible for discussion of anticoagulation and recommendation regarding the left atrial thrombus.  We will also call his cardiologist in [facility] Dr. Bronson Ing to inform him about this finding. I will see the patient back for follow-up visit in 1 year for evaluation with repeat CT scan  of the chest. The patient was advised to call immediately if he has any concerning symptoms in the interval. The patient voices understanding of current disease status and treatment options and is in agreement with the current care plan. All questions were answered. The patient knows to call the clinic with any problems, questions or concerns. We can certainly see the patient much sooner if necessary.  I spent 10 minutes counseling the patient face to face. The total time spent in the appointment was 15 minutes.  Disclaimer: This note was dictated with voice recognition software. Similar sounding words can inadvertently be transcribed and may not be corrected upon review.

## 2019-03-21 NOTE — Telephone Encounter (Signed)
Spoke with [facility] @ Dr. Kate Sable, cardiologist.  Informed Edd Fabian that per Dr. Julien Nordmann, pt has small clot in the Left atrium.  Dr. Julien Nordmann would like to see if cardiologist would resume Eliquis.   Edd Fabian stated she would relay message to Dr. Bronson Ing now along with Dr. Julien Nordmann office notes from today. Dr. Bronson Ing    Phone   [phone].

## 2019-03-21 NOTE — Telephone Encounter (Signed)
Was told by his lung doctors nothing is wrong with his lungs that his issues are from his heart.  Stated that they were suppose to call and speak with Dr Lorrine Kin about this

## 2019-03-21 NOTE — Telephone Encounter (Signed)
Received call from nurse with oncology Glennon Hamilton, Windy Carina, RN) -   Dr. Mckinley Jewel states patient has clot in left atrium and is questioning putting him back on Eliquis.   See oncology notes in Epic.   Please advise.

## 2019-03-22 ENCOUNTER — Telehealth: Payer: Self-pay | Admitting: Internal Medicine

## 2019-03-22 MED ORDER — APIXABAN 5 MG PO TABS: 5.0000 mg | ORAL_TABLET | Freq: Two times a day (BID) | ORAL | 6 refills | Status: DC

## 2019-03-22 NOTE — Telephone Encounter (Signed)
Scheduled appt per 9/28 los - mailed reminder letter with appt date and time

## 2019-03-22 NOTE — Telephone Encounter (Signed)
Resume Eliquis 5 mg twice daily.  He will also need close follow-up with GI given his history of GI bleeding.  Check CBC in 1 week and then 3 weeks later.

## 2019-03-22 NOTE — Telephone Encounter (Signed)
Unfortunately, Plavix will not prevent the type of blood clot he has. I do understand his reluctance, though. Without anticoagulation, there is a high risk of stroke. With it, there are increased chances of bleeding.

## 2019-03-22 NOTE — Telephone Encounter (Signed)
Patient informed and verbalized understanding

## 2019-03-22 NOTE — Telephone Encounter (Signed)
Received call from patient in regards to a recent CT Scan of lungs. Patient was told that he might need to go back on Plavix by Dr. Mckinley Jewel due to some blockages.

## 2019-03-22 NOTE — Telephone Encounter (Signed)
Called back asking for prescription for Eliquis To [place] to go ahead but wants to know where and when he will be sent for blood work

## 2019-03-22 NOTE — Telephone Encounter (Signed)
Patient informed and says he can not take eliquis. States, "I took eliquis in the past and liked to bled to death". Patient is asking to restart plavix. Please advise.

## 2019-03-22 NOTE — Telephone Encounter (Signed)
Patient informed that eliquis rx sent to pharmacy and lab order placed for cbc in one week and again in 3 weeks. Patient says he will have this done at [facility].

## 2019-03-22 NOTE — Addendum Note (Signed)
Addended by: Merlene Laughter on: 03/22/2019 03:16 PM   Modules accepted: Orders

## 2019-03-23 ENCOUNTER — Other Ambulatory Visit: Payer: Self-pay

## 2019-03-23 ENCOUNTER — Other Ambulatory Visit: Payer: Medicare Other

## 2019-03-29 ENCOUNTER — Ambulatory Visit (INDEPENDENT_AMBULATORY_CARE_PROVIDER_SITE_OTHER): Payer: Medicare Other | Admitting: Urology

## 2019-03-29 ENCOUNTER — Other Ambulatory Visit (HOSPITAL_COMMUNITY)
Admission: RE | Admit: 2019-03-29 | Discharge: 2019-03-29 | Disposition: A | Discharge status: Home / Self Care | Payer: Medicare Other | Source: Ambulatory Visit | Attending: Urology | Admitting: Urology

## 2019-03-29 DIAGNOSIS — C678 Malignant neoplasm of overlapping sites of bladder: Secondary | ICD-10-CM | POA: Insufficient documentation

## 2019-03-29 DIAGNOSIS — R8289 Other abnormal findings on cytological and histological examination of urine: Secondary | ICD-10-CM | POA: Diagnosis not present

## 2019-03-30 ENCOUNTER — Other Ambulatory Visit: Payer: Medicare Other

## 2019-03-30 ENCOUNTER — Other Ambulatory Visit: Payer: Self-pay

## 2019-03-30 DIAGNOSIS — I4821 Permanent atrial fibrillation: Secondary | ICD-10-CM | POA: Diagnosis not present

## 2019-03-30 LAB — CBC
Hematocrit: 42 % (ref 37.5–51.0)
Hemoglobin: 14.3 g/dL (ref 13.0–17.7)
MCH: 31.2 pg (ref 26.6–33.0)
MCHC: 34 g/dL (ref 31.5–35.7)
MCV: 92 fL (ref 79–97)
Platelets: 151 10*3/uL (ref 150–450)
RBC: 4.59 x10E6/uL (ref 4.14–5.80)
RDW: 12.7 % (ref 11.6–15.4)
WBC: 6.7 10*3/uL (ref 3.4–10.8)

## 2019-03-30 LAB — CYTOLOGY - NON PAP

## 2019-04-14 ENCOUNTER — Other Ambulatory Visit: Payer: Self-pay

## 2019-04-14 ENCOUNTER — Other Ambulatory Visit: Payer: Medicare Other

## 2019-04-14 DIAGNOSIS — I4821 Permanent atrial fibrillation: Secondary | ICD-10-CM | POA: Diagnosis not present

## 2019-04-14 LAB — CBC
Hematocrit: 39.8 % (ref 37.5–51.0)
Hemoglobin: 13.4 g/dL (ref 13.0–17.7)
MCH: 31.5 pg (ref 26.6–33.0)
MCHC: 33.7 g/dL (ref 31.5–35.7)
MCV: 93 fL (ref 79–97)
Platelets: 152 10*3/uL (ref 150–450)
RBC: 4.26 x10E6/uL (ref 4.14–5.80)
RDW: 13 % (ref 11.6–15.4)
WBC: 6.5 10*3/uL (ref 3.4–10.8)

## 2019-04-19 ENCOUNTER — Telehealth: Payer: Self-pay | Admitting: Cardiovascular Disease

## 2019-04-19 NOTE — Telephone Encounter (Signed)
Patient called in regards to his recent labs . He has a question about taking his Eliquis.

## 2019-04-19 NOTE — Telephone Encounter (Signed)
Pt aware that labs from 10/22 were normal and pt aware the HR of 60 was normal

## 2019-04-20 ENCOUNTER — Other Ambulatory Visit: Payer: Self-pay | Admitting: Nurse Practitioner

## 2019-05-16 ENCOUNTER — Other Ambulatory Visit: Payer: Self-pay | Admitting: Nurse Practitioner

## 2019-05-24 ENCOUNTER — Other Ambulatory Visit: Payer: Self-pay

## 2019-05-25 ENCOUNTER — Ambulatory Visit (INDEPENDENT_AMBULATORY_CARE_PROVIDER_SITE_OTHER): Payer: Medicare Other | Admitting: Nurse Practitioner

## 2019-05-25 ENCOUNTER — Encounter: Payer: Self-pay | Admitting: Nurse Practitioner

## 2019-05-25 VITALS — BP 143/68 | HR 69 | Temp 97.1°F | Resp 20 | Ht 70.0 in | Wt 177.0 lb

## 2019-05-25 DIAGNOSIS — I5031 Acute diastolic (congestive) heart failure: Secondary | ICD-10-CM | POA: Diagnosis not present

## 2019-05-25 DIAGNOSIS — I4821 Permanent atrial fibrillation: Secondary | ICD-10-CM | POA: Diagnosis not present

## 2019-05-25 DIAGNOSIS — I10 Essential (primary) hypertension: Secondary | ICD-10-CM | POA: Diagnosis not present

## 2019-05-25 DIAGNOSIS — E782 Mixed hyperlipidemia: Secondary | ICD-10-CM

## 2019-05-25 DIAGNOSIS — E034 Atrophy of thyroid (acquired): Secondary | ICD-10-CM | POA: Diagnosis not present

## 2019-05-25 DIAGNOSIS — Z6826 Body mass index (BMI) 26.0-26.9, adult: Secondary | ICD-10-CM

## 2019-05-25 DIAGNOSIS — C3491 Malignant neoplasm of unspecified part of right bronchus or lung: Secondary | ICD-10-CM | POA: Diagnosis not present

## 2019-05-25 DIAGNOSIS — N400 Enlarged prostate without lower urinary tract symptoms: Secondary | ICD-10-CM

## 2019-05-25 DIAGNOSIS — J449 Chronic obstructive pulmonary disease, unspecified: Secondary | ICD-10-CM | POA: Diagnosis not present

## 2019-05-25 DIAGNOSIS — K219 Gastro-esophageal reflux disease without esophagitis: Secondary | ICD-10-CM | POA: Insufficient documentation

## 2019-05-25 HISTORY — DX: Gastro-esophageal reflux disease without esophagitis: K21.9

## 2019-05-25 MED ORDER — PANTOPRAZOLE SODIUM 40 MG PO TBEC: DELAYED_RELEASE_TABLET | ORAL | 1 refills | Status: DC

## 2019-05-25 MED ORDER — ATORVASTATIN CALCIUM 40 MG PO TABS: 40.0000 mg | ORAL_TABLET | Freq: Every day | ORAL | 1 refills | Status: DC

## 2019-05-25 MED ORDER — FERROUS GLUCONATE 324 (38 FE) MG PO TABS: ORAL_TABLET | ORAL | 1 refills | Status: DC

## 2019-05-25 MED ORDER — LISINOPRIL 40 MG PO TABS: 40.0000 mg | ORAL_TABLET | Freq: Every day | ORAL | 1 refills | Status: DC

## 2019-05-25 MED ORDER — METOPROLOL TARTRATE 25 MG PO TABS: 12.5000 mg | ORAL_TABLET | Freq: Two times a day (BID) | ORAL | 1 refills | Status: DC

## 2019-05-25 MED ORDER — LEVOTHYROXINE SODIUM 75 MCG PO TABS: 75.0000 ug | ORAL_TABLET | Freq: Every day | ORAL | 1 refills | Status: DC

## 2019-05-25 NOTE — Patient Instructions (Signed)
Apixaban oral tablets What is this medicine? APIXABAN (a PIX a ban) is an anticoagulant (blood thinner). It is used to lower the chance of stroke in people with a medical condition called atrial fibrillation. It is also used to treat or prevent blood clots in the lungs or in the veins. This medicine may be used for other purposes; ask your health care provider or pharmacist if you have questions. COMMON BRAND NAME(S): Eliquis What should I tell my health care provider before I take this medicine? They need to know if you have any of these conditions:  antiphospholipid antibody syndrome  bleeding disorders  bleeding in the brain  blood in your stools (black or tarry stools) or if you have blood in your vomit  history of blood clots  history of stomach bleeding  kidney disease  liver disease  mechanical heart valve  an unusual or allergic reaction to apixaban, other medicines, foods, dyes, or preservatives  pregnant or trying to get pregnant  breast-feeding How should I use this medicine? Take this medicine by mouth with a glass of water. Follow the directions on the prescription label. You can take it with or without food. If it upsets your stomach, take it with food. Take your medicine at regular intervals. Do not take it more often than directed. Do not stop taking except on your doctor's advice. Stopping this medicine may increase your risk of a blood clot. Be sure to refill your prescription before you run out of medicine. Talk to your pediatrician regarding the use of this medicine in children. Special care may be needed. Overdosage: If you think you have taken too much of this medicine contact a poison control center or emergency room at once. NOTE: This medicine is only for you. Do not share this medicine with others. What if I miss a dose? If you miss a dose, take it as soon as you can. If it is almost time for your next dose, take only that dose. Do not take double or  extra doses. What may interact with this medicine? This medicine may interact with the following:  aspirin and aspirin-like medicines  certain medicines for fungal infections like ketoconazole and itraconazole  certain medicines for seizures like carbamazepine and phenytoin  certain medicines that treat or prevent blood clots like warfarin, enoxaparin, and dalteparin  clarithromycin  NSAIDs, medicines for pain and inflammation, like ibuprofen or naproxen  rifampin  ritonavir  St. John's wort This list may not describe all possible interactions. Give your health care provider a list of all the medicines, herbs, non-prescription drugs, or dietary supplements you use. Also tell them if you smoke, drink alcohol, or use illegal drugs. Some items may interact with your medicine. What should I watch for while using this medicine? Visit your healthcare professional for regular checks on your progress. You may need blood work done while you are taking this medicine. Your condition will be monitored carefully while you are receiving this medicine. It is important not to miss any appointments. Avoid sports and activities that might cause injury while you are using this medicine. Severe falls or injuries can cause unseen bleeding. Be careful when using sharp tools or knives. Consider using an Copy. Take special care brushing or flossing your teeth. Report any injuries, bruising, or red spots on the skin to your healthcare professional. If you are going to need surgery or other procedure, tell your healthcare professional that you are taking this medicine. Wear a medical ID bracelet  or chain. Carry a card that describes your disease and details of your medicine and dosage times. What side effects may I notice from receiving this medicine? Side effects that you should report to your doctor or health care professional as soon as possible:  allergic reactions like skin rash, itching or hives,  swelling of the face, lips, or tongue  signs and symptoms of bleeding such as bloody or black, tarry stools; red or dark-brown urine; spitting up blood or brown material that looks like coffee grounds; red spots on the skin; unusual bruising or bleeding from the eye, gums, or nose  signs and symptoms of a blood clot such as chest pain; shortness of breath; pain, swelling, or warmth in the leg  signs and symptoms of a stroke such as changes in vision; confusion; trouble speaking or understanding; severe headaches; sudden numbness or weakness of the face, arm or leg; trouble walking; dizziness; loss of coordination This list may not describe all possible side effects. Call your doctor for medical advice about side effects. You may report side effects to FDA at 1-800-FDA-1088. Where should I keep my medicine? Keep out of the reach of children. Store at room temperature between 20 and 25 degrees C (68 and 77 degrees F). Throw away any unused medicine after the expiration date. NOTE: This sheet is a summary. It may not cover all possible information. If you have questions about this medicine, talk to your doctor, pharmacist, or health care provider.  2020 Elsevier/Gold Standard (2018-02-17 17:39:34)

## 2019-05-25 NOTE — Progress Notes (Addendum)
Subjective:    Patient ID: [name] [name], male    DOB: 1940/12/05, 78 y.o.   MRN: [id number]   Chief Complaint: Medical Management of Chronic Issues    HPI:  1. Essential hypertension No c/o chest pain, sob or headache. Does check blood pressure at home and is running in 211'H systolic most of the time. BP Readings from Last 3 Encounters:  03/21/19 (!) 141/68  03/02/19 (!) 146/77  11/23/18 (!) 145/69    2. Acute diastolic heart failure ([place]) Had telephone visit with cardiology on 03/02/19. At that visit they increased his amlodipine to 29m daily and continued all other meds as previously rx. He is to follow up in March 2021.  3. Permanent atrial fibrillation (HCC) Still on eliquis daily. denies any palpitations or feeling that heart is racing. No bleeding. Stools normal looking  4. Chronic obstructive pulmonary disease, unspecified COPD type ([place] Is currently on no inhalers and denies any coughing.  5. Primary lung cancer, right ([place] Had right upper lobectomy. He did not have to have chemo or radiation. Saw oncology in October and no signs of reoccurrence was seen.he is to follow up next October.  6. Mixed hyperlipidemia Does watch diet and stays very active.  7. Hypothyroidism due to acquired atrophy of thyroid No problems that aware of.  8. Benign prostatic hyperplasia without lower urinary tract symptoms No problems voiding  9. BMI 26.0-26.9,adult No recent weight changes Wt Readings from Last 3 Encounters:  05/25/19 177 lb (80.3 kg)  03/21/19 176 lb 4.8 oz (80 kg)  03/02/19 174 lb (78.9 kg)   BMI Readings from Last 3 Encounters:  05/25/19 25.40 kg/m  03/21/19 25.30 kg/m  03/02/19 24.97 kg/m      Outpatient Encounter Medications as of 05/25/2019  Medication Sig  . amLODipine (NORVASC) 10 MG tablet Take 1 tablet (10 mg total) by mouth daily.  .Marland Kitchenapixaban (ELIQUIS) 5 MG TABS tablet Take 1 tablet (5 mg total) by mouth 2 (two) times daily.  .Marland Kitchenaspirin  EC 81 MG tablet Take 1 tablet (81 mg total) by mouth daily.  .Marland Kitchenatorvastatin (LIPITOR) 40 MG tablet Take 1 tablet (40 mg total) by mouth daily.  . cholecalciferol (VITAMIN D) 1000 UNITS tablet Take 1 tablet (1,000 Units total) by mouth daily.  . ferrous gluconate (FERGON) 324 MG tablet TAKE (1) TABLET TWICE A DAY WITH MEALS (BREAKFAST AND SUPPER)  . levothyroxine (SYNTHROID) 75 MCG tablet Take 1 tablet (75 mcg total) by mouth daily.  .Marland Kitchenlisinopril (ZESTRIL) 40 MG tablet Take 1 tablet (40 mg total) by mouth daily.  . metoprolol tartrate (LOPRESSOR) 25 MG tablet Take 0.5 tablets (12.5 mg total) by mouth 2 (two) times daily.  . Multiple Vitamin (ONE-A-DAY MENS PO) Take 1 tablet by mouth daily.    . naproxen (NAPROSYN) 500 MG tablet Take 1 tablet (500 mg total) by mouth 2 (two) times daily with a meal.  . Omega-3 Fatty Acids (FISH OIL) 1200 MG CAPS Take 2,400 mg by mouth daily.   . pantoprazole (PROTONIX) 40 MG tablet TAKE  (1)  TABLET TWICE A DAY.     Past Surgical History:  Procedure Laterality Date  . ABDOMINAL AORTIC ANEURYSM REPAIR  08-09-09  . BACK SURGERY    . CYSTOSCOPY W/ RETROGRADES Bilateral 09/02/2018   Procedure: CYSTOSCOPY WITH RETROGRADE PYELOGRAM;  Surgeon: DFranchot Gallo MD;  [facility]  Service: Urology;  Laterality: Bilateral;  . CYSTOSCOPY WITH BIOPSY N/A 09/02/2018   Procedure:  CYSTOSCOPY WITH BIOPSY;  Surgeon: Franchot Gallo, MD;  [facility];  Service: Urology;  Laterality: N/A;  . ESOPHAGOGASTRODUODENOSCOPY N/A 02/21/2017   Procedure: ESOPHAGOGASTRODUODENOSCOPY (EGD);  Surgeon: Jerene Bears, MD;  [facility];  Service: Gastroenterology;  Laterality: N/A;  . HYDROCELE EXCISION / REPAIR    . LOBECTOMY Right 03/30/2017   Procedure: RIGHT UPPER LOBECTOMY LUNG;  Surgeon: Melrose Nakayama, MD;  Location: [place];  Service: Thoracic;  Laterality: Right;  . SKIN CANCER EXCISION    . SPINE SURGERY    . TEE  WITHOUT CARDIOVERSION N/A 02/24/2017   Procedure: TRANSESOPHAGEAL ECHOCARDIOGRAM (TEE);  Surgeon: Sherren Mocha, MD;  Location: [place];  Service: Open Heart Surgery;  Laterality: N/A;  . TRANSCATHETER AORTIC VALVE REPLACEMENT, TRANSFEMORAL N/A 02/24/2017   Procedure: TRANSCATHETER AORTIC VALVE REPLACEMENT, TRANSFEMORAL using a 80m Edwards Sapien 3 Aortic Valve;  Surgeon: CSherren Mocha MD;  [place]  Service: Open Heart Surgery;  Laterality: N/A;  . TRANSURETHRAL RESECTION OF BLADDER TUMOR N/A 06/06/2016   Procedure: TRANSURETHRAL RESECTION OF BLADDER TUMOR (TURBT);  Surgeon: SFranchot Gallo MD;  [facility];  Service: Urology;  Laterality: N/A;  . TRANSURETHRAL RESECTION OF BLADDER TUMOR N/A 07/22/2016   Procedure: TRANSURETHRAL RESECTION OF BLADDER TUMOR (TURBT);  Surgeon: SFranchot Gallo MD;  [facility];  Service: Urology;  Laterality: N/A;  . VIDEO ASSISTED THORACOSCOPY (VATS)/WEDGE RESECTION Right 03/30/2017   Procedure: VIDEO ASSISTED THORACOSCOPY (VATS)/WEDGE RESECTION;  Surgeon: HMelrose Nakayama MD;  [facility];  Service: Thoracic;  Laterality: Right;    Family History  Problem Relation Age of Onset  . Heart disease Father        Heart Disease before age 79 . Alcohol abuse Father   . Heart attack Father   . Cancer Mother        Lung  . COPD Mother   . COPD Sister   . Lupus Sister   . Hypertension Brother   . Gout Brother   . Heart attack Son   . Gout Brother   . Prostatitis Brother     New complaints: None today  Social history: Lives with his wife who is very healthy  Controlled substance contract: n/a    Review of Systems  Constitutional: Negative for activity change and appetite change.  HENT: Negative.   Eyes: Negative for pain.  Respiratory: Negative for shortness of breath.   Cardiovascular: Negative for chest pain, palpitations and leg swelling.  Gastrointestinal: Negative for abdominal pain.  Endocrine: Negative for  polydipsia.  Genitourinary: Negative.   Skin: Negative for rash.  Neurological: Negative for dizziness, weakness and headaches.  Hematological: Does not bruise/bleed easily.  Psychiatric/Behavioral: Negative.   All other systems reviewed and are negative.      Objective:   Physical Exam Vitals signs and nursing note reviewed.  Constitutional:      Appearance: Normal appearance. He is well-developed.  HENT:     Head: Normocephalic.     Nose: Nose normal.  Eyes:     Pupils: Pupils are equal, round, and reactive to light.  Neck:     Musculoskeletal: Normal range of motion and neck supple.     Thyroid: No thyroid mass or thyromegaly.     Vascular: No carotid bruit or JVD.     Trachea: Phonation normal.  Cardiovascular:     Rate and Rhythm: Normal rate. Rhythm irregular.  Pulmonary:     Effort: Pulmonary effort is normal. No respiratory distress.  Breath sounds: Normal breath sounds.  Abdominal:     General: Bowel sounds are normal.     Palpations: Abdomen is soft.     Tenderness: There is no abdominal tenderness.  Musculoskeletal: Normal range of motion.  Lymphadenopathy:     Cervical: No cervical adenopathy.  Skin:    General: Skin is warm and dry.  Neurological:     Mental Status: He is alert and oriented to person, place, and time.  Psychiatric:        Behavior: Behavior normal.        Thought Content: Thought content normal.        Judgment: Judgment normal.    BP (!) 143/68   Pulse 69   Temp (!) 97.1 F (36.2 C) (Temporal)   Resp 20   Ht _0  (1.778 m)   Wt 177 lb (80.3 kg)   SpO2 96%   BMI 25.40 kg/m         Assessment & Plan:  [name] [name] comes in today with chief complaint of Medical Management of Chronic Issues   Diagnosis and orders addressed:  1. Essential hypertension Low sodium diet  2. Acute diastolic heart failure ([place]) Keep follow up with crdiology - metoprolol tartrate (LOPRESSOR) 25 MG tablet; Take 0.5 tablets (12.5 mg  total) by mouth 2 (two) times daily.  Dispense: 90 tablet; Refill: 1  3. Permanent atrial fibrillation (HCC) Report any palpitations  4. Chronic obstructive pulmonary disease, unspecified COPD type ([place])  5. Primary lung cancer, right [facility]) Keep yearly follow up with oncology  6. Mixed hyperlipidemia Low fat diet - lisinopril (ZESTRIL) 40 MG tablet; Take 1 tablet (40 mg total) by mouth daily.  Dispense: 90 tablet; Refill: 1 - atorvastatin (LIPITOR) 40 MG tablet; Take 1 tablet (40 mg total) by mouth daily.  Dispense: 90 tablet; Refill: 1  7. Hypothyroidism due to acquired atrophy of thyroid - levothyroxine (SYNTHROID) 75 MCG tablet; Take 1 tablet (75 mcg total) by mouth daily.  Dispense: 90 tablet; Refill: 1  8. Benign prostatic hyperplasia without lower urinary tract symptoms Keep yearly urology follow up  9. BMI 26.0-26.9,adult Discussed diet and exercise for person with BMI >25 Will recheck weight in 3-6 months  10. Gastroesophageal reflux disease without esophagitis Avoid spicy foods Do not eat 2 hours prior to bedtime  - pantoprazole (PROTONIX) 40 MG tablet; TAKE  (1)  TABLET TWICE A DAY.  Dispense: 180 tablet; Refill: 1 Orders Placed This Encounter  Procedures  . CMP14+EGFR  . Lipid panel  . Thyroid Panel With TSH     Labs pending Health Maintenance reviewed Diet and exercise encouraged  Follow up plan: 6 months   [place], FNP

## 2019-05-26 LAB — CMP14+EGFR
ALT: 24 IU/L (ref 0–44)
AST: 28 IU/L (ref 0–40)
Albumin/Globulin Ratio: 1.3 (ref 1.2–2.2)
Albumin: 4.4 g/dL (ref 3.7–4.7)
Alkaline Phosphatase: 76 IU/L (ref 39–117)
BUN/Creatinine Ratio: 17 (ref 10–24)
BUN: 19 mg/dL (ref 8–27)
Bilirubin Total: 0.7 mg/dL (ref 0.0–1.2)
CO2: 22 mmol/L (ref 20–29)
Calcium: 9.5 mg/dL (ref 8.6–10.2)
Chloride: 103 mmol/L (ref 96–106)
Creatinine, Ser: 1.1 mg/dL (ref 0.76–1.27)
GFR calc Af Amer: 73 mL/min/{1.73_m2} (ref >59)
GFR calc non Af Amer: 64 mL/min/{1.73_m2} (ref >59)
Globulin, Total: 3.4 g/dL (ref 1.5–4.5)
Glucose: 109 mg/dL — ABNORMAL HIGH (ref 65–99)
Potassium: 4.2 mmol/L (ref 3.5–5.2)
Sodium: 143 mmol/L (ref 134–144)
Total Protein: 7.8 g/dL (ref 6.0–8.5)

## 2019-05-26 LAB — LIPID PANEL
Chol/HDL Ratio: 3.5 ratio (ref 0.0–5.0)
Cholesterol, Total: 102 mg/dL (ref 100–199)
HDL: 29 mg/dL — ABNORMAL LOW (ref >39)
LDL Chol Calc (NIH): 53 mg/dL (ref 0–99)
Triglycerides: 103 mg/dL (ref 0–149)
VLDL Cholesterol Cal: 20 mg/dL (ref 5–40)

## 2019-05-26 LAB — THYROID PANEL WITH TSH
Free Thyroxine Index: 1.1 — ABNORMAL LOW (ref 1.2–4.9)
T3 Uptake Ratio: 22 % — ABNORMAL LOW (ref 24–39)
T4, Total: 4.9 ug/dL (ref 4.5–12.0)
TSH: 4.89 u[IU]/mL — ABNORMAL HIGH (ref 0.450–4.500)

## 2019-06-22 ENCOUNTER — Other Ambulatory Visit: Payer: Self-pay

## 2019-07-11 ENCOUNTER — Encounter: Payer: Self-pay | Admitting: Urology

## 2019-08-16 ENCOUNTER — Telehealth: Payer: Self-pay | Admitting: Nurse Practitioner

## 2019-08-16 NOTE — Chronic Care Management (AMB) (Signed)
  Chronic Care Management   Note  08/16/2019 Name: [name] [name] MRN: [id number] DOB: Mar 30, 1941  [name] [name] is a 79 y.o. year old male who is a primary care patient of [name], [place]. I reached out to [name] by phone today in response to a referral sent by [name]'s health plan.     [name] was given information about Chronic Care Management services today including:  1. CCM service includes personalized support from designated clinical staff supervised by his physician, including individualized plan of care and coordination with other care providers 2. 24/7 contact phone numbers for assistance for urgent and routine care needs. 3. Service will only be billed when office clinical staff spend 20 minutes or more in a month to coordinate care. 4. Only one practitioner may furnish and bill the service in a calendar month. 5. The patient may stop CCM services at any time (effective at the end of the month) by phone call to the office staff. 6. The patient will be responsible for cost sharing (co-pay) of up to 20% of the service fee (after annual deductible is met).  Patient did not agree to enrollment in care management services and does not wish to consider at this time.  Follow up plan: The patient has been provided with contact information for the care management team and has been advised to call with any health related questions or concerns.   Noreene Larsson, [address] [place], [place], [place] [address] Direct Dial: [phone] [email or web address] Website: [email or web address]

## 2019-08-23 ENCOUNTER — Other Ambulatory Visit: Payer: Self-pay | Admitting: *Deleted

## 2019-08-23 DIAGNOSIS — Z95828 Presence of other vascular implants and grafts: Secondary | ICD-10-CM

## 2019-08-23 DIAGNOSIS — Z8679 Personal history of other diseases of the circulatory system: Secondary | ICD-10-CM

## 2019-08-23 DIAGNOSIS — I6529 Occlusion and stenosis of unspecified carotid artery: Secondary | ICD-10-CM

## 2019-08-24 ENCOUNTER — Encounter (HOSPITAL_COMMUNITY): Payer: Medicare Other

## 2019-08-24 ENCOUNTER — Ambulatory Visit (HOSPITAL_COMMUNITY)
Admission: RE | Admit: 2019-08-24 | Discharge: 2019-08-24 | Disposition: A | Discharge status: Home / Self Care | Payer: Medicare Other | Source: Ambulatory Visit | Attending: Surgery | Admitting: Surgery

## 2019-08-24 ENCOUNTER — Ambulatory Visit: Payer: Medicare Other | Admitting: Physician Assistant

## 2019-08-24 ENCOUNTER — Ambulatory Visit (INDEPENDENT_AMBULATORY_CARE_PROVIDER_SITE_OTHER)
Admission: RE | Admit: 2019-08-24 | Discharge: 2019-08-24 | Disposition: A | Discharge status: Home / Self Care | Payer: Medicare Other | Source: Ambulatory Visit | Attending: Surgery | Admitting: Surgery

## 2019-08-24 ENCOUNTER — Other Ambulatory Visit (HOSPITAL_COMMUNITY): Payer: Medicare Other

## 2019-08-24 ENCOUNTER — Other Ambulatory Visit: Payer: Self-pay

## 2019-08-24 VITALS — BP 138/77 | HR 78 | Temp 97.0°F | Resp 18 | Ht 70.0 in | Wt 174.1 lb

## 2019-08-24 DIAGNOSIS — I6529 Occlusion and stenosis of unspecified carotid artery: Secondary | ICD-10-CM

## 2019-08-24 DIAGNOSIS — Z95828 Presence of other vascular implants and grafts: Secondary | ICD-10-CM

## 2019-08-24 NOTE — Progress Notes (Signed)
Office Note     CC:  follow up EVAR, carotid artery disease  Requesting Provider:  Hassell Done, [name], *   HPI: [name] [name] is a 79 y.o. (23-Jun-1941) male who presents with history abdominal aortic aneurysm status post EVAR 2011.  In addition the patient has a history of mild carotid artery stenosis.  He has no previous history of stroke or TIA.  Denies significant episodes of back pain and denies abdominal pain.  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  He denies temporary monocular blindness, extremity weakness or paralysis, slurred speech or confusion.  Prior to aortic valve surgery, the patient was noted to be anemic and he had been maintained on Eliquis.  He states he required 2 units PRBCs prior to procedure.  Source of anemia was found to be peptic ulcer disease.  He has since been placed on Eliquis again without recurrence of GI bleeding.  History of TAVR 2018.  History of lumbar spine surgery  The pt is on a statin for cholesterol management.  The pt is on a daily aspirin.   Other AC: Eliquis The pt is on Norvasc, Zestril for hypertension.   The pt is not diabetic.   Tobacco hx: Former smoker, quit 2007  Past Medical History:  Diagnosis Date  . AAA (abdominal aortic aneurysm) ([place])   . Aortic stenosis, severe    a. 02/2017: s/p TAVR with Edwards Sapien 3 THV (size 26 mm, model # 9600TFX, serial # [id number])  . Arthritis   . Atherosclerotic peripheral vascular disease ([place])   . Atrial fibrillation, persistent ([place])    a. not a candidate for [place] given hx of GI bleed  . Bilateral carotid artery stenosis   . Bladder cancer ([place])   . COPD (chronic obstructive pulmonary disease) ([place])   . Gastroesophageal reflux disease without esophagitis 05/25/2019  . Hyperlipidemia   . Hypertension   . Hypothyroidism   . Lung cancer ([place])    Right  . Lung mass    Right  . Skin cancer   . Vitamin D deficiency     Past Surgical History:    Procedure Laterality Date  . ABDOMINAL AORTIC ANEURYSM REPAIR  08-09-09  . BACK SURGERY    . CYSTOSCOPY W/ RETROGRADES Bilateral 09/02/2018   Procedure: CYSTOSCOPY WITH RETROGRADE PYELOGRAM;  Surgeon: Franchot Gallo, MD;  [facility]  Service: Urology;  Laterality: Bilateral;  . CYSTOSCOPY WITH BIOPSY N/A 09/02/2018   Procedure: CYSTOSCOPY WITH BIOPSY;  Surgeon: Franchot Gallo, MD;  Location: [facility];  Service: Urology;  Laterality: N/A;  . ESOPHAGOGASTRODUODENOSCOPY N/A 02/21/2017   Procedure: ESOPHAGOGASTRODUODENOSCOPY (EGD);  Surgeon: Jerene Bears, MD;  [facility];  Service: Gastroenterology;  Laterality: N/A;  . HYDROCELE EXCISION / REPAIR    . LOBECTOMY Right 03/30/2017   Procedure: RIGHT UPPER LOBECTOMY LUNG;  Surgeon: Melrose Nakayama, MD;  Location: [place];  Service: Thoracic;  Laterality: Right;  . SKIN CANCER EXCISION    . SPINE SURGERY    . TEE WITHOUT CARDIOVERSION N/A 02/24/2017   Procedure: TRANSESOPHAGEAL ECHOCARDIOGRAM (TEE);  Surgeon: Sherren Mocha, MD;  Location: [place];  Service: Open Heart Surgery;  Laterality: N/A;  . TRANSCATHETER AORTIC VALVE REPLACEMENT, TRANSFEMORAL N/A 02/24/2017   Procedure: TRANSCATHETER AORTIC VALVE REPLACEMENT, TRANSFEMORAL using a 19mm Edwards Sapien 3 Aortic Valve;  Surgeon: Sherren Mocha, MD;  Location: [place];  Service: Open Heart Surgery;  Laterality: N/A;  . TRANSURETHRAL  RESECTION OF BLADDER TUMOR N/A 06/06/2016   Procedure: TRANSURETHRAL RESECTION OF BLADDER TUMOR (TURBT);  Surgeon: Franchot Gallo, MD;  [facility];  Service: Urology;  Laterality: N/A;  . TRANSURETHRAL RESECTION OF BLADDER TUMOR N/A 07/22/2016   Procedure: TRANSURETHRAL RESECTION OF BLADDER TUMOR (TURBT);  Surgeon: Franchot Gallo, MD;  Location: [facility];  Service: Urology;  Laterality: N/A;  . VIDEO ASSISTED THORACOSCOPY (VATS)/WEDGE RESECTION Right 03/30/2017   Procedure: VIDEO ASSISTED THORACOSCOPY  (VATS)/WEDGE RESECTION;  Surgeon: Melrose Nakayama, MD;  Location: [facility];  Service: Thoracic;  Laterality: Right;    Social History   Socioeconomic History  . Marital status: Married    Spouse name: [name] [name]  . Number of children: 2  . Years of education: Not on file  . Highest education level: Not on file  Occupational History  . Occupation: Retired    Comment: Librarian, academic with [employer]  Tobacco Use  . Smoking status: Former Smoker    Packs/day: 1.00    Years: 51.00    Pack years: 51.00    Types: Cigarettes    Start date: 11/25/1954    Quit date: 10/27/2005    Years since quitting: 13.8  . Smokeless tobacco: Never Used  Substance and Sexual Activity  . Alcohol use: No    Alcohol/week: 0.0 standard drinks    Comment: none since 1999  . Drug use: No  . Sexual activity: Yes  Other Topics Concern  . Not on file  Social History Narrative   2 sons that are deceased. 4 grandchildren and 3 great grandchildren. Lives at home with his wife.    Social Determinants of Health   Financial Resource Strain:   . Difficulty of Paying Living Expenses: Not on file  Food Insecurity:   . Worried About Charity fundraiser in the Last Year: Not on file  . Ran Out of Food in the Last Year: Not on file  Transportation Needs:   . Lack of Transportation (Medical): Not on file  . Lack of Transportation (Non-Medical): Not on file  Physical Activity:   . Days of Exercise per Week: Not on file  . Minutes of Exercise per Session: Not on file  Stress:   . Feeling of Stress : Not on file  Social Connections:   . Frequency of Communication with Friends and Family: Not on file  . Frequency of Social Gatherings with Friends and Family: Not on file  . Attends Religious Services: Not on file  . Active Member of Clubs or Organizations: Not on file  . Attends Archivist Meetings: Not on file  . Marital Status: Not on file  Intimate Partner Violence:   . Fear of Current or  Ex-Partner: Not on file  . Emotionally Abused: Not on file  . Physically Abused: Not on file  . Sexually Abused: Not on file    Family History  Problem Relation Age of Onset  . Heart disease Father        Heart Disease before age 44  . Alcohol abuse Father   . Heart attack Father   . Cancer Mother        Lung  . COPD Mother   . COPD Sister   . Lupus Sister   . Hypertension Brother   . Gout Brother   . Heart attack Son   . Gout Brother   . Prostatitis Brother     Current Outpatient Medications  Medication Sig Dispense Refill  . alfuzosin (UROXATRAL) 10  MG 24 hr tablet Take 10 mg by mouth at bedtime.    Marland Kitchen amLODipine (NORVASC) 10 MG tablet Take 1 tablet (10 mg total) by mouth daily. 30 tablet 6  . apixaban (ELIQUIS) 5 MG TABS tablet Take 1 tablet (5 mg total) by mouth 2 (two) times daily. 60 tablet 6  . aspirin EC 81 MG tablet Take 1 tablet (81 mg total) by mouth daily. 90 tablet 3  . atorvastatin (LIPITOR) 40 MG tablet Take 1 tablet (40 mg total) by mouth daily. 90 tablet 1  . cholecalciferol (VITAMIN D) 1000 UNITS tablet Take 1 tablet (1,000 Units total) by mouth daily. 30 tablet 6  . ferrous gluconate (FERGON) 324 MG tablet 1 po BID 180 tablet 1  . levothyroxine (SYNTHROID) 75 MCG tablet Take 1 tablet (75 mcg total) by mouth daily. 90 tablet 1  . lisinopril (ZESTRIL) 40 MG tablet Take 1 tablet (40 mg total) by mouth daily. 90 tablet 1  . metoprolol tartrate (LOPRESSOR) 25 MG tablet Take 0.5 tablets (12.5 mg total) by mouth 2 (two) times daily. 90 tablet 1  . Multiple Vitamin (ONE-A-DAY MENS PO) Take 1 tablet by mouth daily.      . Omega-3 Fatty Acids (FISH OIL) 1200 MG CAPS Take 2,400 mg by mouth daily.     . pantoprazole (PROTONIX) 40 MG tablet TAKE  (1)  TABLET TWICE A DAY. 180 tablet 1   No current facility-administered medications for this visit.    Allergies  Allergen Reactions  . Eliquis [Apixaban] Other (See Comments)    Eliquis and lovenox caused his " ulcer to  bleed."  . Lovenox [Enoxaparin]     HIT panel ordered 10/10     REVIEW OF SYSTEMS:   [X]  denotes positive finding, [ ]  denotes negative finding Cardiac  Comments:  Chest pain or chest pressure:    Shortness of breath upon exertion:    Short of breath when lying flat:    Irregular heart rhythm:        Vascular    Pain in calf, thigh, or hip brought on by ambulation:    Pain in feet at night that wakes you up from your sleep:     Blood clot in your veins:    Leg swelling:         Pulmonary    Oxygen at home:    Productive cough:     Wheezing:         Neurologic    Sudden weakness in arms or legs:     Sudden numbness in arms or legs:     Sudden onset of difficulty speaking or slurred speech:    Temporary loss of vision in one eye:     Problems with dizziness:         Gastrointestinal    Blood in stool:     Vomited blood:         Genitourinary    Burning when urinating:     Blood in urine:        Psychiatric    Major depression:         Hematologic    Bleeding problems: x   Problems with blood clotting too easily:        Skin    Rashes or ulcers:        Constitutional    Fever or chills:      PHYSICAL EXAMINATION:  There were no vitals filed for this visit. General:  WDWN in NAD;  vital signs documented above Gait: Not observed HENT: WNL, normocephalic Pulmonary: normal non-labored breathing , without Rales, rhonchi,  wheezing Cardiac: irregular HR, without  Murmurs with right carotid bruit Abdomen: soft, NT, no masses Skin: without rashes Vascular Exam/Pulses: Extremities: without ischemic changes, without Gangrene , without cellulitis; without open wounds;  Musculoskeletal: no muscle wasting or atrophy  Neurologic: A&O X 3;  No focal weakness or paresthesias are detected Psychiatric:  The pt has Normal affect.  Pulse exam: The patient has 2+ palpable bilateral ulnar, femoral, popliteal, dorsalis pedis and posterior tibial pulses  Non-Invasive  Vascular Imaging:   Abdominal Aorta: The largest aortic measurement is 3.8 cm. Patent  endovascular aneurysm repair with no evidence of endoleak. The largest  aortic diameter remains essentially unchanged compared to prior exam.  Previous diameter measurement was 3.9 cm  obtained on 08/10/2018.  Right Carotid: Velocities in the right ICA are consistent with a 1-39%  stenosis.         Non-hemodynamically significant plaque <50% noted in the  CCA. The         ECA appears <50% stenosed.   Left Carotid: Velocities in the left ICA are consistent with a 1-39%  stenosis.        Non-hemodynamically significant plaque <50% noted in the  CCA. The        ECA appears <50% stenosed.   Vertebrals: Bilateral vertebral arteries demonstrate antegrade flow.  Subclavians: Right subclavian artery flow was disturbed. Normal flow        hemodynamics were seen in the left subclavian artery.    ASSESSMENT/PLAN:: 79 y.o. male here for follow up for abdominal aortic aneurysm status post EVAR in 2011.  I reviewed his abdominal aortic ultrasound studies going back to 2018.  There have been some fluctuations in the AP diameter from year to year most likely due to imaging technicality.  AP diameter today measures 3.36.  Mild stable bilateral carotid artery stenosis.  The patient remains asymptomatic. We discussed signs and symptoms of stroke, claudication, sudden persistent back or abdominal pain.  His Eliquis was resumed in October of last year and he has tolerated this without recurrence of GI bleed. Follow-up in 1 year with abdominal EVAR ultrasound and carotid duplex   Barbie Banner, PA-C [facility] [phone]  Clinic MD:  Oneida Alar

## 2019-08-26 ENCOUNTER — Other Ambulatory Visit: Payer: Self-pay | Admitting: *Deleted

## 2019-08-26 DIAGNOSIS — Z8679 Personal history of other diseases of the circulatory system: Secondary | ICD-10-CM

## 2019-08-26 DIAGNOSIS — I6529 Occlusion and stenosis of unspecified carotid artery: Secondary | ICD-10-CM

## 2019-08-26 DIAGNOSIS — Z95828 Presence of other vascular implants and grafts: Secondary | ICD-10-CM

## 2019-09-06 ENCOUNTER — Ambulatory Visit: Payer: Medicare Other | Admitting: Urology

## 2019-09-06 ENCOUNTER — Other Ambulatory Visit: Payer: Self-pay

## 2019-09-06 VITALS — BP 158/79 | HR 87 | Temp 97.3°F

## 2019-09-06 DIAGNOSIS — C678 Malignant neoplasm of overlapping sites of bladder: Secondary | ICD-10-CM | POA: Diagnosis not present

## 2019-09-06 DIAGNOSIS — N400 Enlarged prostate without lower urinary tract symptoms: Secondary | ICD-10-CM | POA: Diagnosis not present

## 2019-09-06 LAB — POCT URINALYSIS DIPSTICK
Bilirubin, UA: NEGATIVE
Glucose, UA: NEGATIVE
Ketones, UA: NEGATIVE
Leukocytes, UA: NEGATIVE
Nitrite, UA: NEGATIVE
Protein, UA: NEGATIVE
Spec Grav, UA: <=1.005 — AB (ref 1.010–1.025)
Urobilinogen, UA: 0.2 E.U./dL
pH, UA: 7 (ref 5.0–8.0)

## 2019-09-06 MED ORDER — CIPROFLOXACIN HCL 500 MG PO TABS
500.0000 mg | ORAL_TABLET | Freq: Once | ORAL | Status: AC
  Administered 2019-09-06: 15:00:00 500 mg via ORAL

## 2019-09-06 NOTE — Progress Notes (Signed)

## 2019-09-06 NOTE — Progress Notes (Signed)
H&P  Chief Complaint: Bladder Cancer  History of Present Illness:   3.16.2021: N gross hematuria or changes in urination.  (below copied from [facility] records):  Bladder Cancer:   [name] [name] is a 79 year-old male established patient who is here for follow-up of bladder cancer treatment.  His last cysto was 11/23/2018.   He underwent emergent TURBT on 12.15.2017 for a large bladder cancer burden at his right bladder neck, posterior dome and left lateral wall. He did well after the procedure. He apparently been bleeding for 3 months. He is a cigarette smoker.   Pathology revealed high-grade, NMIBC. Muscle was present in the specimen.   He underwent repeat resection on 1.30.2018. 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   He was initiated on BCG and competed his 6 induction treatments on 3.27.2018.   5.15.2018 -he was found to have a small papillary recurrence on his anterior bladder wall--he underwent cysto/cauterization of this on 5.17.2018.  7.31.2018. 1st 3 maintenance BCG's completed  9.18.2018: Office cysto negative  10.24.2018: completed 3 maintenance BCG Rx's  1.22.2019: Surveillance cystoscopy.  2.19.2019: Completed 3 maintenance BCG treatments  4.2.2019: He has had no blood or burning with urination since his BCG has been completed. He tolerated the BCG well.  2.25.2020--Cysto--small BT recurrence.   3.12.2020: TURBT/bilateral RGPs. Cytologies of renal washings nml. Bladder tumor LG NMIBC.   6.2.2020: Cysto negative.   10.6.2020: The there is been no urinary issues since his last visit.    Past Medical History:  Diagnosis Date  . AAA (abdominal aortic aneurysm) ([place])   . Aortic stenosis, severe    a. 02/2017: s/p TAVR with Edwards Sapien 3 THV (size 26 mm, model # 9600TFX, serial # [id number])  . Arthritis   . Atherosclerotic peripheral vascular disease ([place])   . Atrial fibrillation, persistent ([place])    a. not a candidate  for [place] given hx of GI bleed  . Bilateral carotid artery stenosis   . Bladder cancer ([place])   . COPD (chronic obstructive pulmonary disease) ([place])   . Gastroesophageal reflux disease without esophagitis 05/25/2019  . Hyperlipidemia   . Hypertension   . Hypothyroidism   . Lung cancer ([place])    Right  . Lung mass    Right  . Skin cancer   . Vitamin D deficiency     Past Surgical History:  Procedure Laterality Date  . ABDOMINAL AORTIC ANEURYSM REPAIR  08-09-09  . BACK SURGERY    . CYSTOSCOPY W/ RETROGRADES Bilateral 09/02/2018   Procedure: CYSTOSCOPY WITH RETROGRADE PYELOGRAM;  Surgeon: Franchot Gallo, MD;  Location: [facility];  Service: Urology;  Laterality: Bilateral;  . CYSTOSCOPY WITH BIOPSY N/A 09/02/2018   Procedure: CYSTOSCOPY WITH BIOPSY;  Surgeon: Franchot Gallo, MD;  Location: [facility];  Service: Urology;  Laterality: N/A;  . ESOPHAGOGASTRODUODENOSCOPY N/A 02/21/2017   Procedure: ESOPHAGOGASTRODUODENOSCOPY (EGD);  Surgeon: Jerene Bears, MD;  Location: [facility];  Service: Gastroenterology;  Laterality: N/A;  . HYDROCELE EXCISION / REPAIR    . LOBECTOMY Right 03/30/2017   Procedure: RIGHT UPPER LOBECTOMY LUNG;  Surgeon: Melrose Nakayama, MD;  Location: [place];  Service: Thoracic;  Laterality: Right;  . SKIN CANCER EXCISION    . SPINE SURGERY    . TEE WITHOUT CARDIOVERSION N/A 02/24/2017   Procedure: TRANSESOPHAGEAL ECHOCARDIOGRAM (TEE);  Surgeon: Sherren Mocha, MD;  Location: [place];  Service: Open Heart Surgery;  Laterality: N/A;  . TRANSCATHETER AORTIC VALVE REPLACEMENT, TRANSFEMORAL N/A  02/24/2017   Procedure: TRANSCATHETER AORTIC VALVE REPLACEMENT, TRANSFEMORAL using a 26mm Edwards Sapien 3 Aortic Valve;  Surgeon: Sherren Mocha, MD;  Location: [place];  Service: Open Heart Surgery;  Laterality: N/A;  . TRANSURETHRAL RESECTION OF BLADDER TUMOR N/A 06/06/2016   Procedure: TRANSURETHRAL RESECTION OF BLADDER TUMOR (TURBT);  Surgeon:  Franchot Gallo, MD;  [facility];  Service: Urology;  Laterality: N/A;  . TRANSURETHRAL RESECTION OF BLADDER TUMOR N/A 07/22/2016   Procedure: TRANSURETHRAL RESECTION OF BLADDER TUMOR (TURBT);  Surgeon: Franchot Gallo, MD;  [facility];  Service: Urology;  Laterality: N/A;  . VIDEO ASSISTED THORACOSCOPY (VATS)/WEDGE RESECTION Right 03/30/2017   Procedure: VIDEO ASSISTED THORACOSCOPY (VATS)/WEDGE RESECTION;  Surgeon: Melrose Nakayama, MD;  Location: [place];  Service: Thoracic;  Laterality: Right;    Home Medications:  Allergies as of 09/06/2019      Reactions   Eliquis [apixaban] Other (See Comments)   Eliquis and lovenox caused his " ulcer to bleed."   Lovenox [enoxaparin]    HIT panel ordered 10/10      Medication List       Accurate as of September 06, 2019  1:03 PM. If you have any questions, ask your nurse or doctor.        alfuzosin 10 MG 24 hr tablet Commonly known as: UROXATRAL Take 10 mg by mouth at bedtime.   amLODipine 10 MG tablet Commonly known as: NORVASC Take 1 tablet (10 mg total) by mouth daily.   apixaban 5 MG Tabs tablet Commonly known as: Eliquis Take 1 tablet (5 mg total) by mouth 2 (two) times daily.   aspirin EC 81 MG tablet Take 1 tablet (81 mg total) by mouth daily.   atorvastatin 40 MG tablet Commonly known as: LIPITOR Take 1 tablet (40 mg total) by mouth daily.   cholecalciferol 1000 units tablet Commonly known as: VITAMIN D Take 1 tablet (1,000 Units total) by mouth daily.   ferrous gluconate 324 MG tablet Commonly known as: FERGON 1 po BID   Fish Oil 1200 MG Caps Take 2,400 mg by mouth daily.   levothyroxine 75 MCG tablet Commonly known as: SYNTHROID Take 1 tablet (75 mcg total) by mouth daily.   lisinopril 40 MG tablet Commonly known as: ZESTRIL Take 1 tablet (40 mg total) by mouth daily.   metoprolol tartrate 25 MG tablet Commonly known as: LOPRESSOR Take 0.5 tablets (12.5 mg total) by mouth 2 (two) times  daily.   ONE-A-DAY MENS PO Take 1 tablet by mouth daily.   pantoprazole 40 MG tablet Commonly known as: PROTONIX TAKE  (1)  TABLET TWICE A DAY.       Allergies:  Allergies  Allergen Reactions  . Eliquis [Apixaban] Other (See Comments)    Eliquis and lovenox caused his " ulcer to bleed."  . Lovenox [Enoxaparin]     HIT panel ordered 10/10    Family History  Problem Relation Age of Onset  . Heart disease Father        Heart Disease before age 23  . Alcohol abuse Father   . Heart attack Father   . Cancer Mother        Lung  . COPD Mother   . COPD Sister   . Lupus Sister   . Hypertension Brother   . Gout Brother   . Heart attack Son   . Gout Brother   . Prostatitis Brother     Social History:  reports that he quit smoking about 13 years  ago. His smoking use included cigarettes. He started smoking about 64 years ago. He has a 51.00 pack-year smoking history. He has never used smokeless tobacco. He reports that he does not drink alcohol or use drugs.  ROS: A complete review of systems was performed.  All systems are negative except for pertinent findings as noted.  Physical Exam:  Vital signs in last 24 hours: There were no vitals taken for this visit. Constitutional:  Alert and oriented, No acute distress Cardiovascular: Regular rate  Respiratory: Normal respiratory effort Genitourinary: Normal male phallus, testes are descended bilaterally and non-tender and without masses, scrotum is normal in appearance without lesions or masses, perineum is normal on inspection. Lymphatic: No lymphadenopathy Neurologic: Grossly intact, no focal deficits Psychiatric: Normal mood and affect   I have reviewed prior pt notes  I have reviewed notes from referring/previous physicians  I have reviewed urinalysis results  Path results reviewed   Cystoscopy Procedure Note:  Indication: Follow up of bladder cancer  After informed consent and discussion of the procedure and  its risks, [name] [name] was positioned and prepped in the standard fashion. Cystoscopy was performed with a flexible cystoscope. The urethra, bladder neck and entire bladder was visualized in a standard fashion. The prostate was non obstructive. The ureteral orifices were visualized in their normal location and orientation. No urothelial lesions of bladder.  Cipro administered post procedure.   Impression/Assessment:  NED  Plan: I';ll see back in 6 mos for next cyesto

## 2019-09-12 ENCOUNTER — Other Ambulatory Visit: Payer: Self-pay | Admitting: Cardiovascular Disease

## 2019-09-12 DIAGNOSIS — I10 Essential (primary) hypertension: Secondary | ICD-10-CM

## 2019-09-21 ENCOUNTER — Encounter: Payer: Self-pay | Admitting: *Deleted

## 2019-09-22 ENCOUNTER — Telehealth: Payer: Self-pay | Admitting: Urology

## 2019-09-22 NOTE — Telephone Encounter (Signed)
[facility] called and stated they have sent an electronic request for a refill on this pts Uroxatral and have not got a response.

## 2019-09-26 ENCOUNTER — Other Ambulatory Visit: Payer: Self-pay

## 2019-09-26 DIAGNOSIS — N400 Enlarged prostate without lower urinary tract symptoms: Secondary | ICD-10-CM

## 2019-09-26 MED ORDER — ALFUZOSIN HCL ER 10 MG PO TB24: 10.0000 mg | ORAL_TABLET | Freq: Every day | ORAL | 99 refills | Status: DC

## 2019-09-26 NOTE — Telephone Encounter (Signed)
Rx Sent  

## 2019-09-29 ENCOUNTER — Encounter: Payer: Self-pay | Admitting: Family Medicine

## 2019-09-29 ENCOUNTER — Other Ambulatory Visit: Payer: Self-pay

## 2019-09-29 ENCOUNTER — Ambulatory Visit: Payer: Medicare Other | Admitting: Family Medicine

## 2019-09-29 ENCOUNTER — Other Ambulatory Visit: Payer: Medicare Other

## 2019-09-29 VITALS — BP 124/66 | HR 48 | Ht 70.0 in | Wt 174.2 lb

## 2019-09-29 DIAGNOSIS — Z952 Presence of prosthetic heart valve: Secondary | ICD-10-CM | POA: Diagnosis not present

## 2019-09-29 DIAGNOSIS — I5032 Chronic diastolic (congestive) heart failure: Secondary | ICD-10-CM | POA: Diagnosis not present

## 2019-09-29 DIAGNOSIS — I10 Essential (primary) hypertension: Secondary | ICD-10-CM

## 2019-09-29 DIAGNOSIS — I4821 Permanent atrial fibrillation: Secondary | ICD-10-CM

## 2019-09-29 DIAGNOSIS — R5383 Other fatigue: Secondary | ICD-10-CM | POA: Diagnosis not present

## 2019-09-29 DIAGNOSIS — I2510 Atherosclerotic heart disease of native coronary artery without angina pectoris: Secondary | ICD-10-CM | POA: Diagnosis not present

## 2019-09-29 DIAGNOSIS — E785 Hyperlipidemia, unspecified: Secondary | ICD-10-CM

## 2019-09-29 MED ORDER — METOPROLOL SUCCINATE ER 25 MG PO TB24: 12.5000 mg | ORAL_TABLET | Freq: Every day | ORAL | 1 refills | Status: DC

## 2019-09-29 NOTE — Progress Notes (Addendum)
Cardiology Office Note  Date: 09/29/2019   ID: [name], DOB 09-07-1940, MRN [id number]  PCP:  [name], FNP  Cardiologist:  Kate Sable, MD Electrophysiologist:  None   Chief Complaint: Follow-up CAD, TAVR, HLD, HTN, atrial fibrillation  History of Present Illness:  [name] [name] is a 79 y.o. male with a history of CAD, status post TAVR, HLD, HTN, atrial fibrillation.  Last saw Dr. Bronson Ing 03/02/2019 f/u chronic diastolic heart failure: He was euvolemic and stable.  A. fib symptomatically stable on metoprolol, not on anticoagulants due to history of GI bleed.  Coronary artery disease symptomatically stable, hyperlipidemia with recent LDL of 47.  Blood pressure was elevated and amlodipine was increased to 10 mg daily.  PVD was followed by vascular surgery  He presents today with recent complaints of feeling significant fatigue and swimmy headedness on occasion which can occur at any time.  He states he can be walking and all of a sudden feel swimmy headed.  He denies any near-syncope or syncopal episodes.  He had a recent TSH, T3, and T4 indicating elevated TSH and decreased T3-T4.  There were no adjustments to his thyroid medications.  He states he has had some recent occasional dark tarry looking stools.  Of note he apparently was recently placed back on Eliquis and aspirin was not discontinued.  He had been on Plavix and aspirin in the past for his CAD but Plavix was stopped.  He states otherwise he feels good.  His heart rate is much slower than usual today.  Rate is 48.  He states only recent medication changes were his amlodipine was increased to 10 mg daily at last visit.   Past Medical History:  Diagnosis Date  . AAA (abdominal aortic aneurysm) ([place])   . Aortic stenosis, severe    a. 02/2017: s/p TAVR with Edwards Sapien 3 THV (size 26 mm, model # 9600TFX, serial # [id number])  . Arthritis   . Atherosclerotic peripheral vascular disease ([place])   .  Atrial fibrillation, persistent ([place])    a. not a candidate for [place] given hx of GI bleed  . Bilateral carotid artery stenosis   . Bladder cancer ([place])   . COPD (chronic obstructive pulmonary disease) ([place])   . Gastroesophageal reflux disease without esophagitis 05/25/2019  . Hyperlipidemia   . Hypertension   . Hypothyroidism   . Lung cancer ([place])    Right  . Lung mass    Right  . Skin cancer   . Vitamin D deficiency     Past Surgical History:  Procedure Laterality Date  . ABDOMINAL AORTIC ANEURYSM REPAIR  08-09-09  . BACK SURGERY    . CYSTOSCOPY W/ RETROGRADES Bilateral 09/02/2018   Procedure: CYSTOSCOPY WITH RETROGRADE PYELOGRAM;  Surgeon: Franchot Gallo, MD;  Location: [facility];  Service: Urology;  Laterality: Bilateral;  . CYSTOSCOPY WITH BIOPSY N/A 09/02/2018   Procedure: CYSTOSCOPY WITH BIOPSY;  Surgeon: Franchot Gallo, MD;  Location: [facility];  Service: Urology;  Laterality: N/A;  . ESOPHAGOGASTRODUODENOSCOPY N/A 02/21/2017   Procedure: ESOPHAGOGASTRODUODENOSCOPY (EGD);  Surgeon: Jerene Bears, MD;  Location: [facility]COPY;  Service: Gastroenterology;  Laterality: N/A;  . HYDROCELE EXCISION / REPAIR    . LOBECTOMY Right 03/30/2017   Procedure: RIGHT UPPER LOBECTOMY LUNG;  Surgeon: Melrose Nakayama, MD;  Location: [place];  Service: Thoracic;  Laterality: Right;  . SKIN CANCER EXCISION    . SPINE SURGERY    . TEE WITHOUT CARDIOVERSION  N/A 02/24/2017   Procedure: TRANSESOPHAGEAL ECHOCARDIOGRAM (TEE);  Surgeon: Sherren Mocha, MD;  Location: [place];  Service: Open Heart Surgery;  Laterality: N/A;  . TRANSCATHETER AORTIC VALVE REPLACEMENT, TRANSFEMORAL N/A 02/24/2017   Procedure: TRANSCATHETER AORTIC VALVE REPLACEMENT, TRANSFEMORAL using a 35mm Edwards Sapien 3 Aortic Valve;  Surgeon: Sherren Mocha, MD;  Location: [place];  Service: Open Heart Surgery;  Laterality: N/A;  . TRANSURETHRAL RESECTION OF BLADDER TUMOR N/A 06/06/2016   Procedure:  TRANSURETHRAL RESECTION OF BLADDER TUMOR (TURBT);  Surgeon: Franchot Gallo, MD;  [facility];  Service: Urology;  Laterality: N/A;  . TRANSURETHRAL RESECTION OF BLADDER TUMOR N/A 07/22/2016   Procedure: TRANSURETHRAL RESECTION OF BLADDER TUMOR (TURBT);  Surgeon: Franchot Gallo, MD;  [facility];  Service: Urology;  Laterality: N/A;  . VIDEO ASSISTED THORACOSCOPY (VATS)/WEDGE RESECTION Right 03/30/2017   Procedure: VIDEO ASSISTED THORACOSCOPY (VATS)/WEDGE RESECTION;  Surgeon: Melrose Nakayama, MD;  Location: [facility];  Service: Thoracic;  Laterality: Right;    Current Outpatient Medications  Medication Sig Dispense Refill  . alfuzosin (UROXATRAL) 10 MG 24 hr tablet Take 1 tablet (10 mg total) by mouth at bedtime. 30 tablet prn  . amLODipine (NORVASC) 10 MG tablet TAKE 1 TABLET EVERY DAY 30 tablet 0  . apixaban (ELIQUIS) 5 MG TABS tablet Take 1 tablet (5 mg total) by mouth 2 (two) times daily. 60 tablet 6  . atorvastatin (LIPITOR) 40 MG tablet Take 1 tablet (40 mg total) by mouth daily. 90 tablet 1  . cholecalciferol (VITAMIN D) 1000 UNITS tablet Take 1 tablet (1,000 Units total) by mouth daily. 30 tablet 6  . ferrous gluconate (FERGON) 324 MG tablet 1 po BID 180 tablet 1  . levothyroxine (SYNTHROID) 75 MCG tablet Take 1 tablet (75 mcg total) by mouth daily. 90 tablet 1  . lisinopril (ZESTRIL) 40 MG tablet Take 1 tablet (40 mg total) by mouth daily. 90 tablet 1  . Multiple Vitamin (ONE-A-DAY MENS PO) Take 1 tablet by mouth daily.      . Omega-3 Fatty Acids (FISH OIL) 1200 MG CAPS Take 2,400 mg by mouth daily.     . pantoprazole (PROTONIX) 40 MG tablet TAKE  (1)  TABLET TWICE A DAY. 180 tablet 1  . metoprolol succinate (TOPROL XL) 25 MG 24 hr tablet Take 0.5 tablets (12.5 mg total) by mouth daily. 45 tablet 1   No current facility-administered medications for this visit.   Allergies:  Lovenox [enoxaparin]   Social History: The patient  reports that he quit smoking about 13  years ago. His smoking use included cigarettes. He started smoking about 64 years ago. He has a 51.00 pack-year smoking history. He has never used smokeless tobacco. He reports that he does not drink alcohol or use drugs.   Family History: The patient's family history includes Alcohol abuse in his father; COPD in his mother and sister; Cancer in his mother; Gout in his brother and brother; Heart attack in his father and son; Heart disease in his father; Hypertension in his brother; Lupus in his sister; Prostatitis in his brother.   ROS:  Please see the history of present illness. Otherwise, complete review of systems is positive for none.  All other systems are reviewed and negative.   Physical Exam: VS:  BP 124/66   Pulse (!) 48   Ht 5\' 10"  (1.778 m)   Wt 174 lb 3.2 oz (79 kg)   SpO2 99%   BMI 25.00 kg/m , BMI Body mass index is  25 kg/m.  Wt Readings from Last 3 Encounters:  09/29/19 174 lb 3.2 oz (79 kg)  08/24/19 174 lb 1.6 oz (79 kg)  05/25/19 177 lb (80.3 kg)    General: Patient appears comfortable at rest. Neck: Supple, no elevated JVP or carotid bruits, no thyromegaly. Lungs: Clear to auscultation, nonlabored breathing at rest. Cardiac: Irregularly irregular rate and rhythm, no S3 or significant systolic murmur, no pericardial rub. Extremities: No pitting edema, distal pulses 2+. Skin: Warm and dry. Musculoskeletal: No kyphosis. Neuropsychiatric: Alert and oriented x3, affect grossly appropriate.  ECG:  An ECG dated 09/29/2019 was personally reviewed today and demonstrated:  Sinus bradycardia rate of 57 atrial fibrillation diffuse mild nonspecific T wave abnormality  Recent Labwork: 04/14/2019: Hemoglobin 13.4; Platelets 152 05/25/2019: ALT 24; AST 28; BUN 19; Creatinine, Ser 1.10; Potassium 4.2; Sodium 143; TSH 4.890     Component Value Date/Time   CHOL 102 05/25/2019 0835   CHOL 118 01/14/2013 1157   TRIG 103 05/25/2019 0835   TRIG 160 (H) 04/13/2014 0846   TRIG 178 (H)  01/14/2013 1157   HDL 29 (L) 05/25/2019 0835   HDL 32 (L) 04/13/2014 0846   HDL 30 (L) 01/14/2013 1157   CHOLHDL 3.5 05/25/2019 0835   LDLCALC 53 05/25/2019 0835   LDLCALC 61 04/13/2014 0846   LDLCALC 52 01/14/2013 1157    Other Studies Reviewed Today:  Carotid artery duplex study 08/24/2019 Right Carotid: Velocities in the right ICA are consistent with a 1-39% stenosis. Nonhemodynamically significant plaque <50% noted in the CCA. The ECA appears <50% stenosed. Final Left Carotid: Velocities in the left ICA are consistent with a 1-39% stenosis. Nonhemodynamically significant plaque <50% noted in the CCA. The ECA appears <50% stenosed. Vertebrals: Bilateral vertebral arteries demonstrate antegrade flow. Subclavians: Right subclavian artery flow was   Vascular US EVAR duplex 08/24/2019 Summary:  Abdominal Aorta: The largest aortic measurement is 3.8 cm. Patent  endovascular aneurysm repair with no evidence of endoleak. The largest  aortic diameter remains essentially unchanged compared to prior exam.  Previous diameter measurement was 3.9 cm  obtained on 08/10/2018.    Echocardiogram 02/25/18:  - Left ventricle: The cavity size was normal. Wall thickness was normal. Systolic function was normal. The estimated ejection fraction was in the range of 55% to 60%. Wall motion was normal; there were no regional wall motion abnormalities. There was no evidence of elevated ventricular filling pressure by Doppler parameters. - Aortic valve: A 66mm Edwards Sapienn 3 TAVR bioprosthesis was present There was trivial perivalvular regurgitation. Peak velocity (S): 220 cm/s. Mean gradient (S): 9 mm Hg. Peak gradient (S): 19 mm Hg. - Left atrium: The atrium was mildly dilated. Volume/bsa, ES (1-plane Simpson&'s, A4C): 40 ml/m^2. - Right ventricle: The cavity size was mildly dilated. Wall thickness was normal. Largest aortic measurement 3.8 cm.- Tricuspid valve: There was  trivial regurgitation. - Pulmonary arteries: Systolic pressure was mildly increased. PA peak pressure: 32 mm Hg (S).  Assessment and Plan:  1. S/P TAVR (transcatheter aortic valve replacement)   2. Chronic diastolic heart failure (HCC)   3. Permanent atrial fibrillation ([place])   4. CAD in native artery   5. Essential hypertension   6. Hyperlipidemia LDL goal <70   7. Other fatigue    1. S/P TAVR (transcatheter aortic valve replacement) Vascular ultrasound EVAR duplex showed status post endovascular aneurysm repair of no evidence of endoleak.  Unchanged since prior exam.  He follows with vascular.   2. Chronic diastolic heart failure (  [place]) Patient denies any recent increase in shortness of breath, weight gain, lower extremity edema.  Last echocardiogram showed EF of 55 to 60%.  Aortic valve bioprosthesis was present with trivial perivalvular regurgitation.  Continue Toprol-XL 25 mg daily.  3. Permanent atrial fibrillation (HCC) EKG today shows atrial fibrillation with a rate of 57 on EKG.  Heart rate appears to have decreased over the last month or so from a high of 87 down to 48 on arrival today.  Stop metoprolol 12.5 p.o. twice daily.  Start Toprol XL 12.5 mg daily.  He reports fatigue and some recent black tarry stools.  Stop aspirin, continue Eliquis 5 mg p.o. twice daily.  May need to stop Eliquis again after we check a CBC.  He has a history of GI bleed on anticoagulants in the past.  4. CAD in native artery Patient denies any recent progressive anginal or exertional symptoms.  His only complaint is fatigue which may be related to slow heart rate and low thyroid function.  He reports some recent black tarry stools.  Stop aspirin.  Patient is currently on Eliquis for atrial fibrillation.  5. Essential hypertension Blood pressure is well controlled on current therapy.  Continue amlodipine 10 mg daily, lisinopril 40 mg daily.  6. Hyperlipidemia LDL goal <70 Last lipid panel  05/25/2019: Total cholesterol 102, triglycerides 103, HDL 29, LDL 53.  Continue atorvastatin 40 mg daily.  7. Other fatigue Get a CBC, BMP, TSH, T3, T4.  Patient's last thyroid function was lower than usual.  He may need an increase in his thyroid medication by PCP.  Patient also states he has noticed some black tarry appearing stools recently.  Apparently he was recently restarted on Eliquis but aspirin was not discontinued.  His heart rate is slower than usual with a rate of 48.   Medication Adjustments/Labs and Tests Ordered: Current medicines are reviewed at length with the patient today.  Concerns regarding medicines are outlined above.   Disposition: Follow-up with Dr. Bronson Ing or APP 1 month  Signed, Levell July, NP 09/29/2019 1:54 PM    [facility] at [place], [place], [place] [address] Phone: [phone]; Fax: [phone]

## 2019-09-29 NOTE — Patient Instructions (Signed)
Medication Instructions:   Your physician has recommended you make the following change in your medication:   Stop aspirin  Stop metoprolol tartrate  Start metoprolol succinate 25 mg taking 1/2 tablet by mouth daily  Continue other medications the same  Labwork:  Your physician recommends that you return for lab work in: TODAY to check your BMET, CBC, TSH, T4 & T3. You may have this done at [place].  Testing/Procedures:  NONE  Follow-Up:  Your physician recommends that you schedule a follow-up appointment in: 1 month (office).  Any Other Special Instructions Will Be Listed Below (If Applicable).  If you need a refill on your cardiac medications before your next appointment, please call your pharmacy.

## 2019-09-29 NOTE — Addendum Note (Signed)
Addended by: Julian Hy T on: 09/29/2019 02:40 PM   Modules accepted: Orders

## 2019-10-12 ENCOUNTER — Other Ambulatory Visit: Payer: Self-pay | Admitting: Cardiovascular Disease

## 2019-10-27 ENCOUNTER — Other Ambulatory Visit: Payer: Self-pay

## 2019-10-27 ENCOUNTER — Ambulatory Visit: Payer: Medicare Other | Admitting: Cardiovascular Disease

## 2019-10-27 ENCOUNTER — Encounter: Payer: Self-pay | Admitting: Cardiovascular Disease

## 2019-10-27 VITALS — BP 130/64 | HR 64 | Ht 70.0 in | Wt 177.0 lb

## 2019-10-27 DIAGNOSIS — I25118 Atherosclerotic heart disease of native coronary artery with other forms of angina pectoris: Secondary | ICD-10-CM

## 2019-10-27 DIAGNOSIS — Z95828 Presence of other vascular implants and grafts: Secondary | ICD-10-CM

## 2019-10-27 DIAGNOSIS — I739 Peripheral vascular disease, unspecified: Secondary | ICD-10-CM | POA: Diagnosis not present

## 2019-10-27 DIAGNOSIS — E785 Hyperlipidemia, unspecified: Secondary | ICD-10-CM

## 2019-10-27 DIAGNOSIS — Z952 Presence of prosthetic heart valve: Secondary | ICD-10-CM | POA: Diagnosis not present

## 2019-10-27 DIAGNOSIS — I4821 Permanent atrial fibrillation: Secondary | ICD-10-CM

## 2019-10-27 NOTE — Patient Instructions (Signed)
Medication Instructions:  Continue all current medications.   Labwork: none  Testing/Procedures: none  Follow-Up: 6 months   Any Other Special Instructions Will Be Listed Below (If Applicable).   If you need a refill on your cardiac medications before your next appointment, please call your pharmacy.  

## 2019-10-27 NOTE — Progress Notes (Signed)
SUBJECTIVE: The patient presents for follow-up after having been evaluated by Levell July, NP on 09/29/2019.  He was complaining of lightheadedness at that time and was noted to be bradycardic with a heart rate of 48 bpm.  He was switched from Lopressor 12.5 mg twice daily to Toprol-XL 12.5 mg daily.  Aspirin was discontinued and he has been complaining of melena.  Several labs were obtained.  Creatinine 1.19 on 10/05/2019.  TSH and CBC were normal.  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  Coronary angiography on 01/14/17 showed 60% proximal LAD stenosis with mild nonobstructive disease in the circumflex and RCA. It was decided to manage this medically. He also has a history of COPD, abdominal aortic aneurysm status post stent graft repair in 2011, and atrial fibrillation.  The patient denies any symptoms of chest pain, palpitations, shortness of breath, lightheadedness, dizziness, leg swelling, orthopnea, PND, and syncope.  His legs sometimes get a little weak but this has also been improving.  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  He and his wife were married 36 years on 04/26/2019.   Review of Systems: As per "subjective", otherwise negative.  Allergies  Allergen Reactions  . Lovenox [Enoxaparin]     HIT panel ordered 10/10    Current Outpatient Medications  Medication Sig Dispense Refill  . alfuzosin (UROXATRAL) 10 MG 24 hr tablet Take 1 tablet (10 mg total) by mouth at bedtime. 30 tablet prn  . amLODipine (NORVASC) 10 MG tablet TAKE 1 TABLET EVERY DAY 30 tablet 0  . atorvastatin (LIPITOR) 40 MG tablet Take 1 tablet (40 mg total) by mouth daily. 90 tablet 1  . cholecalciferol (VITAMIN D) 1000 UNITS tablet Take 1 tablet (1,000 Units total) by mouth daily. 30 tablet 6  . ELIQUIS 5 MG TABS tablet TAKE  (1)  TABLET TWICE A DAY. 60 tablet 6  . ferrous gluconate (FERGON) 324 MG tablet 1 po BID 180 tablet 1  . levothyroxine (SYNTHROID) 75 MCG tablet  Take 1 tablet (75 mcg total) by mouth daily. 90 tablet 1  . lisinopril (ZESTRIL) 40 MG tablet Take 1 tablet (40 mg total) by mouth daily. 90 tablet 1  . metoprolol succinate (TOPROL XL) 25 MG 24 hr tablet Take 0.5 tablets (12.5 mg total) by mouth daily. 45 tablet 1  . Multiple Vitamin (ONE-A-DAY MENS PO) Take 1 tablet by mouth daily.      . Omega-3 Fatty Acids (FISH OIL) 1200 MG CAPS Take 2,400 mg by mouth daily.     . pantoprazole (PROTONIX) 40 MG tablet TAKE  (1)  TABLET TWICE A DAY. 180 tablet 1   No current facility-administered medications for this visit.    Past Medical History:  Diagnosis Date  . AAA (abdominal aortic aneurysm) ([place])   . Aortic stenosis, severe    a. 02/2017: s/p TAVR with Edwards Sapien 3 THV (size 26 mm, model # 9600TFX, serial # [id number])  . Arthritis   . Atherosclerotic peripheral vascular disease ([place])   . Atrial fibrillation, persistent ([place])    a. not a candidate for [place] given hx of GI bleed  . Bilateral carotid artery stenosis   . Bladder cancer ([place])   . COPD (chronic obstructive pulmonary disease) ([place])   . Gastroesophageal reflux disease without esophagitis 05/25/2019  . Hyperlipidemia   . Hypertension   . Hypothyroidism   . Lung cancer ([place])    Right  . Lung mass  Right  . Skin cancer   . Vitamin D deficiency     Past Surgical History:  Procedure Laterality Date  . ABDOMINAL AORTIC ANEURYSM REPAIR  08-09-09  . BACK SURGERY    . CYSTOSCOPY W/ RETROGRADES Bilateral 09/02/2018   Procedure: CYSTOSCOPY WITH RETROGRADE PYELOGRAM;  Surgeon: Franchot Gallo, MD;  [facility];  Service: Urology;  Laterality: Bilateral;  . CYSTOSCOPY WITH BIOPSY N/A 09/02/2018   Procedure: CYSTOSCOPY WITH BIOPSY;  Surgeon: Franchot Gallo, MD;  Location[facility];  Service: Urology;  Laterality: N/A;  . ESOPHAGOGASTRODUODENOSCOPY N/A 02/21/2017   Procedure: ESOPHAGOGASTRODUODENOSCOPY (EGD);  Surgeon: Jerene Bears, MD;   Location: [facility];  Service: Gastroenterology;  Laterality: N/A;  . HYDROCELE EXCISION / REPAIR    . LOBECTOMY Right 03/30/2017   Procedure: RIGHT UPPER LOBECTOMY LUNG;  Surgeon: Melrose Nakayama, MD;  Location: [facility];  Service: Thoracic;  Laterality: Right;  . SKIN CANCER EXCISION    . SPINE SURGERY    . TEE WITHOUT CARDIOVERSION N/A 02/24/2017   Procedure: TRANSESOPHAGEAL ECHOCARDIOGRAM (TEE);  Surgeon: Sherren Mocha, MD;  Location: [facility];  Service: Open Heart Surgery;  Laterality: N/A;  . TRANSCATHETER AORTIC VALVE REPLACEMENT, TRANSFEMORAL N/A 02/24/2017   Procedure: TRANSCATHETER AORTIC VALVE REPLACEMENT, TRANSFEMORAL using a 50mm Edwards Sapien 3 Aortic Valve;  Surgeon: Sherren Mocha, MD;  Location: [facility];  Service: Open Heart Surgery;  Laterality: N/A;  . TRANSURETHRAL RESECTION OF BLADDER TUMOR N/A 06/06/2016   Procedure: TRANSURETHRAL RESECTION OF BLADDER TUMOR (TURBT);  Surgeon: Franchot Gallo, MD;  Location: [facility];  Service: Urology;  Laterality: N/A;  . TRANSURETHRAL RESECTION OF BLADDER TUMOR N/A 07/22/2016   Procedure: TRANSURETHRAL RESECTION OF BLADDER TUMOR (TURBT);  Surgeon: Franchot Gallo, MD;  Location: [facility];  Service: Urology;  Laterality: N/A;  . VIDEO ASSISTED THORACOSCOPY (VATS)/WEDGE RESECTION Right 03/30/2017   Procedure: VIDEO ASSISTED THORACOSCOPY (VATS)/WEDGE RESECTION;  Surgeon: Melrose Nakayama, MD;  Location: [facility];  Service: Thoracic;  Laterality: Right;    Social History   Socioeconomic History  . Marital status: Married    Spouse name: Tyreque Finken  . Number of children: 2  . Years of education: Not on file  . Highest education level: Not on file  Occupational History  . Occupation: Retired    Comment: Librarian, academic with [employer]  Tobacco Use  . Smoking status: Former Smoker    Packs/day: 1.00    Years: 51.00    Pack years: 51.00    Types: Cigarettes    Start date: 11/25/1954    Quit date: 10/27/2005    Years since  quitting: 14.0  . Smokeless tobacco: Never Used  Substance and Sexual Activity  . Alcohol use: No    Alcohol/week: 0.0 standard drinks    Comment: none since 1999  . Drug use: No  . Sexual activity: Yes  Other Topics Concern  . Not on file  Social History Narrative   2 sons that are deceased. 4 grandchildren and 3 great grandchildren. Lives at home with his wife.    Social Determinants of Health   Financial Resource Strain:   . Difficulty of Paying Living Expenses:   Food Insecurity:   . Worried About Charity fundraiser in the Last Year:   . Arboriculturist in the Last Year:   Transportation Needs:   . Film/video editor (Medical):   Marland Kitchen Lack of Transportation (Non-Medical):   Physical Activity:   . Days of Exercise  per Week:   . Minutes of Exercise per Session:   Stress:   . Feeling of Stress :   Social Connections:   . Frequency of Communication with Friends and Family:   . Frequency of Social Gatherings with Friends and Family:   . Attends Religious Services:   . Active Member of Clubs or Organizations:   . Attends Archivist Meetings:   Marland Kitchen Marital Status:   Intimate Partner Violence:   . Fear of Current or Ex-Partner:   . Emotionally Abused:   Marland Kitchen Physically Abused:   . Sexually Abused:      Vitals:   10/27/19 1424  BP: 130/64  Pulse: 64  SpO2: 98%  Weight: 177 lb (80.3 kg)  Height: 5\' 10"  (1.778 m)    Wt Readings from Last 3 Encounters:  10/27/19 177 lb (80.3 kg)  09/29/19 174 lb 3.2 oz (79 kg)  08/24/19 174 lb 1.6 oz (79 kg)     PHYSICAL EXAM General: NAD HEENT: Normal. Neck: No JVD, no thyromegaly. Lungs: Clear to auscultation bilaterally with normal respiratory effort. CV: Regular rate and irregular rhythm, normal S1/S2, no S3, no murmur. No pretibial or periankle edema.   Abdomen: Soft, nontender, no distention.  Neurologic: Alert and oriented.  Psych: Normal affect. Skin: Normal. Musculoskeletal: No gross deformities.       Labs: Lab Results  Component Value Date/Time   K 4.2 05/25/2019 08:35 AM   K 4.2 05/07/2017 02:19 PM   BUN 19 05/25/2019 08:35 AM   BUN 14.5 05/07/2017 02:19 PM   CREATININE 1.10 05/25/2019 08:35 AM   CREATININE 1.09 03/18/2019 10:02 AM   CREATININE 1.0 05/07/2017 02:19 PM   ALT 24 05/25/2019 08:35 AM   ALT 31 03/18/2019 10:02 AM   ALT 14 05/07/2017 02:19 PM   TSH 4.890 (H) 05/25/2019 08:35 AM   HGB 13.4 04/14/2019 08:02 AM   HGB 13.1 05/07/2017 02:19 PM     Lipids: Lab Results  Component Value Date/Time   LDLCALC 53 05/25/2019 08:35 AM   LDLCALC 61 04/13/2014 08:46 AM   LDLCALC 52 01/14/2013 11:57 AM   CHOL 102 05/25/2019 08:35 AM   CHOL 118 01/14/2013 11:57 AM   TRIG 103 05/25/2019 08:35 AM   TRIG 160 (H) 04/13/2014 08:46 AM   TRIG 178 (H) 01/14/2013 11:57 AM   HDL 29 (L) 05/25/2019 08:35 AM   HDL 32 (L) 04/13/2014 08:46 AM   HDL 30 (L) 01/14/2013 11:57 AM      Echocardiogram 02/25/18:  - Left ventricle: The cavity size was normal. Wall thickness was normal. Systolic function was normal. The estimated ejection fraction was in the range of 55% to 60%. Wall motion was normal; there were no regional wall motion abnormalities. There was no evidence of elevated ventricular filling pressure by Doppler parameters. - Aortic valve: A 60mm Edwards Sapienn 3 TAVR bioprosthesis was present There was trivial perivalvular regurgitation. Peak velocity (S): 220 cm/s. Mean gradient (S): 9 mm Hg. Peak gradient (S): 19 mm Hg. - Left atrium: The atrium was mildly dilated. Volume/bsa, ES (1-plane Simpson&'s, A4C): 40 ml/m^2. - Right ventricle: The cavity size was mildly dilated. Wall thickness was normal. - Right atrium: The atrium was mildly dilated. - Tricuspid valve: There was trivial regurgitation. - Pulmonary arteries: Systolic pressure was mildly increased. PA peak pressure: 32 mm Hg (S).    ASSESSMENT AND PLAN:  1. Severe aortic stenosis s/p TAVR  in 02/2017:Symptomatically stable. Normal prosthetic valve function as noted above by  most recent echocardiogram in October 2018.  2.Permanentatrial fibrillation:Symptomatically stable on metoprolol succinate 12.5 mg daily. No longer bradycardic.  He was restarted on Eliquis in April 2021.  No complaints of hematochezia or melena.  3.Coronary artery disease: Symptomatically stable. Continue atorvastatin and metoprolol.  No aspirin as he is on Eliquis.  4. Hyperlipidemia: Continue Lipitor. Lipids reviewed above and LDL at goal.6. Hypertension: Blood pressure is  normal.  No changes to therapy.  5. Peripheral vascular disease: Followed by vascular surgery.Continue statin.    Disposition: Follow up 6 months   Kate Sable, M.D., F.A.C.C.

## 2019-11-24 ENCOUNTER — Encounter: Payer: Self-pay | Admitting: Nurse Practitioner

## 2019-11-24 ENCOUNTER — Ambulatory Visit (INDEPENDENT_AMBULATORY_CARE_PROVIDER_SITE_OTHER): Payer: Medicare Other | Admitting: Nurse Practitioner

## 2019-11-24 ENCOUNTER — Other Ambulatory Visit: Payer: Self-pay

## 2019-11-24 VITALS — BP 140/83 | HR 59 | Temp 98.2°F | Resp 20 | Ht 70.0 in | Wt 177.0 lb

## 2019-11-24 DIAGNOSIS — E782 Mixed hyperlipidemia: Secondary | ICD-10-CM

## 2019-11-24 DIAGNOSIS — I6523 Occlusion and stenosis of bilateral carotid arteries: Secondary | ICD-10-CM

## 2019-11-24 DIAGNOSIS — E034 Atrophy of thyroid (acquired): Secondary | ICD-10-CM

## 2019-11-24 DIAGNOSIS — J449 Chronic obstructive pulmonary disease, unspecified: Secondary | ICD-10-CM

## 2019-11-24 DIAGNOSIS — Z6826 Body mass index (BMI) 26.0-26.9, adult: Secondary | ICD-10-CM

## 2019-11-24 DIAGNOSIS — I10 Essential (primary) hypertension: Secondary | ICD-10-CM

## 2019-11-24 DIAGNOSIS — E559 Vitamin D deficiency, unspecified: Secondary | ICD-10-CM

## 2019-11-24 DIAGNOSIS — K219 Gastro-esophageal reflux disease without esophagitis: Secondary | ICD-10-CM

## 2019-11-24 DIAGNOSIS — I4821 Permanent atrial fibrillation: Secondary | ICD-10-CM | POA: Diagnosis not present

## 2019-11-24 DIAGNOSIS — N400 Enlarged prostate without lower urinary tract symptoms: Secondary | ICD-10-CM

## 2019-11-24 MED ORDER — FERROUS GLUCONATE 324 (38 FE) MG PO TABS: ORAL_TABLET | ORAL | 1 refills | Status: DC

## 2019-11-24 MED ORDER — ATORVASTATIN CALCIUM 40 MG PO TABS: 40.0000 mg | ORAL_TABLET | Freq: Every day | ORAL | 1 refills | Status: DC

## 2019-11-24 MED ORDER — METOPROLOL SUCCINATE ER 25 MG PO TB24: 12.5000 mg | ORAL_TABLET | Freq: Every day | ORAL | 1 refills | Status: DC

## 2019-11-24 MED ORDER — AMLODIPINE BESYLATE 10 MG PO TABS: 10.0000 mg | ORAL_TABLET | Freq: Every day | ORAL | 1 refills | Status: DC

## 2019-11-24 MED ORDER — LISINOPRIL 40 MG PO TABS: 40.0000 mg | ORAL_TABLET | Freq: Every day | ORAL | 1 refills | Status: DC

## 2019-11-24 MED ORDER — LEVOTHYROXINE SODIUM 75 MCG PO TABS: 75.0000 ug | ORAL_TABLET | Freq: Every day | ORAL | 1 refills | Status: DC

## 2019-11-24 MED ORDER — PANTOPRAZOLE SODIUM 40 MG PO TBEC: DELAYED_RELEASE_TABLET | ORAL | 1 refills | Status: DC

## 2019-11-24 NOTE — Patient Instructions (Signed)
DASH Eating Plan DASH stands for "Dietary Approaches to Stop Hypertension." The DASH eating plan is a healthy eating plan that has been shown to reduce high blood pressure (hypertension). It may also reduce your risk for type 2 diabetes, heart disease, and stroke. The DASH eating plan may also help with weight loss. What are tips for following this plan?  General guidelines  Avoid eating more than 2,300 mg (milligrams) of salt (sodium) a day. If you have hypertension, you may need to reduce your sodium intake to 1,500 mg a day.  Limit alcohol intake to no more than 1 drink a day for nonpregnant women and 2 drinks a day for men. One drink equals 12 oz of beer, 5 oz of wine, or 1 oz of hard liquor.  Work with your health care provider to maintain a healthy body weight or to lose weight. Ask what an ideal weight is for you.  Get at least 30 minutes of exercise that causes your heart to beat faster (aerobic exercise) most days of the week. Activities may include walking, swimming, or biking.  Work with your health care provider or diet and nutrition specialist (dietitian) to adjust your eating plan to your individual calorie needs. Reading food labels   Check food labels for the amount of sodium per serving. Choose foods with less than 5 percent of the Daily Value of sodium. Generally, foods with less than 300 mg of sodium per serving fit into this eating plan.  To find whole grains, look for the word "whole" as the first word in the ingredient list. Shopping  Buy products labeled as "low-sodium" or "no salt added."  Buy fresh foods. Avoid canned foods and premade or frozen meals. Cooking  Avoid adding salt when cooking. Use salt-free seasonings or herbs instead of table salt or sea salt. Check with your health care provider or pharmacist before using salt substitutes.  Do not fry foods. Cook foods using healthy methods such as baking, boiling, grilling, and broiling instead.  Cook with  heart-healthy oils, such as olive, canola, soybean, or sunflower oil. Meal planning  Eat a balanced diet that includes: ? 5 or more servings of fruits and vegetables each day. At each meal, try to fill half of your plate with fruits and vegetables. ? Up to 6-8 servings of whole grains each day. ? Less than 6 oz of lean meat, poultry, or fish each day. A 3-oz serving of meat is about the same size as a deck of cards. One egg equals 1 oz. ? 2 servings of low-fat dairy each day. ? A serving of nuts, seeds, or beans 5 times each week. ? Heart-healthy fats. Healthy fats called Omega-3 fatty acids are found in foods such as flaxseeds and coldwater fish, like sardines, salmon, and mackerel.  Limit how much you eat of the following: ? Canned or prepackaged foods. ? Food that is high in trans fat, such as fried foods. ? Food that is high in saturated fat, such as fatty meat. ? Sweets, desserts, sugary drinks, and other foods with added sugar. ? Full-fat dairy products.  Do not salt foods before eating.  Try to eat at least 2 vegetarian meals each week.  Eat more home-cooked food and less restaurant, buffet, and fast food.  When eating at a restaurant, ask that your food be prepared with less salt or no salt, if possible. What foods are recommended? The items listed may not be a complete list. Talk with your dietitian about   what dietary choices are best for you. Grains Whole-grain or whole-wheat bread. Whole-grain or whole-wheat pasta. Brown rice. Oatmeal. Quinoa. Bulgur. Whole-grain and low-sodium cereals. Pita bread. Low-fat, low-sodium crackers. Whole-wheat flour tortillas. Vegetables Fresh or frozen vegetables (raw, steamed, roasted, or grilled). Low-sodium or reduced-sodium tomato and vegetable juice. Low-sodium or reduced-sodium tomato sauce and tomato paste. Low-sodium or reduced-sodium canned vegetables. Fruits All fresh, dried, or frozen fruit. Canned fruit in natural juice (without  added sugar). Meat and other protein foods Skinless chicken or turkey. Ground chicken or turkey. Pork with fat trimmed off. Fish and seafood. Egg whites. Dried beans, peas, or lentils. Unsalted nuts, nut butters, and seeds. Unsalted canned beans. Lean cuts of beef with fat trimmed off. Low-sodium, lean deli meat. Dairy Low-fat (1%) or fat-free (skim) milk. Fat-free, low-fat, or reduced-fat cheeses. Nonfat, low-sodium ricotta or cottage cheese. Low-fat or nonfat yogurt. Low-fat, low-sodium cheese. Fats and oils Soft margarine without trans fats. Vegetable oil. Low-fat, reduced-fat, or light mayonnaise and salad dressings (reduced-sodium). Canola, safflower, olive, soybean, and sunflower oils. Avocado. Seasoning and other foods Herbs. Spices. Seasoning mixes without salt. Unsalted popcorn and pretzels. Fat-free sweets. What foods are not recommended? The items listed may not be a complete list. Talk with your dietitian about what dietary choices are best for you. Grains Baked goods made with fat, such as croissants, muffins, or some breads. Dry pasta or rice meal packs. Vegetables Creamed or fried vegetables. Vegetables in a cheese sauce. Regular canned vegetables (not low-sodium or reduced-sodium). Regular canned tomato sauce and paste (not low-sodium or reduced-sodium). Regular tomato and vegetable juice (not low-sodium or reduced-sodium). Pickles. Olives. Fruits Canned fruit in a light or heavy syrup. Fried fruit. Fruit in cream or butter sauce. Meat and other protein foods Fatty cuts of meat. Ribs. Fried meat. Bacon. Sausage. Bologna and other processed lunch meats. Salami. Fatback. Hotdogs. Bratwurst. Salted nuts and seeds. Canned beans with added salt. Canned or smoked fish. Whole eggs or egg yolks. Chicken or turkey with skin. Dairy Whole or 2% milk, cream, and half-and-half. Whole or full-fat cream cheese. Whole-fat or sweetened yogurt. Full-fat cheese. Nondairy creamers. Whipped toppings.  Processed cheese and cheese spreads. Fats and oils Butter. Stick margarine. Lard. Shortening. Ghee. Bacon fat. Tropical oils, such as coconut, palm kernel, or palm oil. Seasoning and other foods Salted popcorn and pretzels. Onion salt, garlic salt, seasoned salt, table salt, and sea salt. Worcestershire sauce. Tartar sauce. Barbecue sauce. Teriyaki sauce. Soy sauce, including reduced-sodium. Steak sauce. Canned and packaged gravies. Fish sauce. Oyster sauce. Cocktail sauce. Horseradish that you find on the shelf. Ketchup. Mustard. Meat flavorings and tenderizers. Bouillon cubes. Hot sauce and Tabasco sauce. Premade or packaged marinades. Premade or packaged taco seasonings. Relishes. Regular salad dressings. Where to find more information:  National Heart, Lung, and Blood Institute: www.nhlbi.nih.gov  American Heart Association: www.heart.org Summary  The DASH eating plan is a healthy eating plan that has been shown to reduce high blood pressure (hypertension). It may also reduce your risk for type 2 diabetes, heart disease, and stroke.  With the DASH eating plan, you should limit salt (sodium) intake to 2,300 mg a day. If you have hypertension, you may need to reduce your sodium intake to 1,500 mg a day.  When on the DASH eating plan, aim to eat more fresh fruits and vegetables, whole grains, lean proteins, low-fat dairy, and heart-healthy fats.  Work with your health care provider or diet and nutrition specialist (dietitian) to adjust your eating plan to your   individual calorie needs. This information is not intended to replace advice given to you by your health care provider. Make sure you discuss any questions you have with your health care provider. Document Revised: 05/22/2017 Document Reviewed: 06/02/2016 Elsevier Patient Education  2020 [employer]  

## 2019-11-24 NOTE — Progress Notes (Addendum)
Subjective:    Patient ID: [name] [name], male    DOB: Feb 28, 1941, 79 y.o.   MRN: [id number]   Chief Complaint: Medical Management of Chronic Issues    HPI:  1. Essential hypertension No c/o chest pain, slight sob but no headaches. Does check blood pressure at home. Running around 140systolic. BP Readings from Last 3 Encounters:  11/24/19 140/83  10/27/19 130/64  09/29/19 124/66     2. Permanent atrial fibrillation ([place]) Is on eliquis and is doing well. No bleeding issues denies palpitations or heart racing. Cardiologist decreased to 1/2 tablet daily.  3. Bilateral carotid artery stenosis [facility] and carotid were checked there and they were both normal.  4. Mixed hyperlipidemia Does watch diet. Avoids fried food. Does not eat a lot of meat anymore. Doe no exercise. Lab Results  Component Value Date   CHOL 102 05/25/2019   HDL 29 (L) 05/25/2019   LDLCALC 53 05/25/2019   TRIG 103 05/25/2019   CHOLHDL 3.5 05/25/2019     5. Chronic obstructive pulmonary disease, unspecified COPD type ([place]) Breathing I good. Gets SOB if over exerted. Does play golf occasionally without SOB. Is on no inhalers.  6. Gastroesophageal reflux disease without esophagitis Is on protonix daily and is doing well.  7. Hypothyroidism due to acquired atrophy of thyroid No problems that he know of.  8. Vitamin D deficiency is on daily vitamind supplement  9. Benign prostatic hyperplasia without lower urinary tract symptoms No issues voiding. Sees urology every 6 months  10. BMI 26.0-26.9,adult No recent weight changes Wt Readings from Last 3 Encounters:  11/24/19 177 lb (80.3 kg)  10/27/19 177 lb (80.3 kg)  09/29/19 174 lb 3.2 oz (79 kg)   BMI Readings from Last 3 Encounters:  11/24/19 25.40 kg/m  10/27/19 25.40 kg/m  09/29/19 25.00 kg/m       Outpatient Encounter Medications as of 11/24/2019  Medication Sig  . alfuzosin (UROXATRAL) 10  MG 24 hr tablet Take 1 tablet (10 mg total) by mouth at bedtime.  Marland Kitchen amLODipine (NORVASC) 10 MG tablet TAKE 1 TABLET EVERY DAY  . atorvastatin (LIPITOR) 40 MG tablet Take 1 tablet (40 mg total) by mouth daily.  . cholecalciferol (VITAMIN D) 1000 UNITS tablet Take 1 tablet (1,000 Units total) by mouth daily.  Marland Kitchen ELIQUIS 5 MG TABS tablet TAKE  (1)  TABLET TWICE A DAY.  . ferrous gluconate (FERGON) 324 MG tablet 1 po BID  . levothyroxine (SYNTHROID) 75 MCG tablet Take 1 tablet (75 mcg total) by mouth daily.  Marland Kitchen lisinopril (ZESTRIL) 40 MG tablet Take 1 tablet (40 mg total) by mouth daily.  . metoprolol succinate (TOPROL XL) 25 MG 24 hr tablet Take 0.5 tablets (12.5 mg total) by mouth daily.  . Multiple Vitamin (ONE-A-DAY MENS PO) Take 1 tablet by mouth daily.    . Omega-3 Fatty Acids (FISH OIL) 1200 MG CAPS Take 2,400 mg by mouth daily.   . pantoprazole (PROTONIX) 40 MG tablet TAKE  (1)  TABLET TWICE A DAY.   No facility-administered encounter medications on file as of 11/24/2019.    Past Surgical History:  Procedure Laterality Date  . ABDOMINAL AORTIC ANEURYSM REPAIR  08-09-09  . BACK SURGERY    . CYSTOSCOPY W/ RETROGRADES Bilateral 09/02/2018   Procedure: CYSTOSCOPY WITH RETROGRADE PYELOGRAM;  Surgeon: Franchot Gallo, MD;  [facility];  Service: Urology;  Laterality: Bilateral;  . CYSTOSCOPY WITH BIOPSY N/A 09/02/2018  Procedure: CYSTOSCOPY WITH BIOPSY;  Surgeon: Franchot Gallo, MD;  Location: [facility];  Service: Urology;  Laterality: N/A;  . ESOPHAGOGASTRODUODENOSCOPY N/A 02/21/2017   Procedure: ESOPHAGOGASTRODUODENOSCOPY (EGD);  Surgeon: Jerene Bears, MD;  [facility]COPY;  Service: Gastroenterology;  Laterality: N/A;  . HYDROCELE EXCISION / REPAIR    . LOBECTOMY Right 03/30/2017   Procedure: RIGHT UPPER LOBECTOMY LUNG;  Surgeon: Melrose Nakayama, MD;  Location: [place];  Service: Thoracic;  Laterality: Right;  . SKIN CANCER EXCISION      . SPINE SURGERY    . TEE WITHOUT CARDIOVERSION N/A 02/24/2017   Procedure: TRANSESOPHAGEAL ECHOCARDIOGRAM (TEE);  Surgeon: Sherren Mocha, MD;  Location: [place];  Service: Open Heart Surgery;  Laterality: N/A;  . TRANSCATHETER AORTIC VALVE REPLACEMENT, TRANSFEMORAL N/A 02/24/2017   Procedure: TRANSCATHETER AORTIC VALVE REPLACEMENT, TRANSFEMORAL using a 110m Edwards Sapien 3 Aortic Valve;  Surgeon: CSherren Mocha MD;  Location: [place]  Service: Open Heart Surgery;  Laterality: N/A;  . TRANSURETHRAL RESECTION OF BLADDER TUMOR N/A 06/06/2016   Procedure: TRANSURETHRAL RESECTION OF BLADDER TUMOR (TURBT);  Surgeon: SFranchot Gallo MD;  [facility];  Service: Urology;  Laterality: N/A;  . TRANSURETHRAL RESECTION OF BLADDER TUMOR N/A 07/22/2016   Procedure: TRANSURETHRAL RESECTION OF BLADDER TUMOR (TURBT);  Surgeon: SFranchot Gallo MD;  [facility];  Service: Urology;  Laterality: N/A;  . VIDEO ASSISTED THORACOSCOPY (VATS)/WEDGE RESECTION Right 03/30/2017   Procedure: VIDEO ASSISTED THORACOSCOPY (VATS)/WEDGE RESECTION;  Surgeon: HMelrose Nakayama MD;  [facility];  Service: Thoracic;  Laterality: Right;    Family History  Problem Relation Age of Onset  . Heart disease Father        Heart Disease before age 80 . Alcohol abuse Father   . Heart attack Father   . Cancer Mother        Lung  . COPD Mother   . COPD Sister   . Lupus Sister   . Hypertension Brother   . Gout Brother   . Heart attack Son   . Gout Brother   . Prostatitis Brother     New complaints: occasional dizziness for the last 2 weeks. Can sometimes last all day long  Social history: Lives with his wife  Controlled substance contract: n/a    Review of Systems  Constitutional: Negative for diaphoresis.  Eyes: Negative for pain.  Respiratory: Positive for shortness of breath.   Cardiovascular: Negative for chest pain, palpitations and leg swelling.  Gastrointestinal: Negative for abdominal pain.   Endocrine: Negative for polydipsia.  Skin: Negative for rash.  Neurological: Positive for dizziness. Negative for weakness and headaches.  Hematological: Does not bruise/bleed easily.  All other systems reviewed and are negative.      Objective:   Physical Exam Vitals and nursing note reviewed.  Constitutional:      Appearance: Normal appearance. He is well-developed.  HENT:     Head: Normocephalic.     Right Ear: There is impacted cerumen.     Left Ear: There is impacted cerumen.     Nose: Nose normal.  Eyes:     Pupils: Pupils are equal, round, and reactive to light.  Neck:     Thyroid: No thyroid mass or thyromegaly.     Vascular: Carotid bruit (2/6 bil) present. No JVD.     Trachea: Phonation normal.  Cardiovascular:     Rate and Rhythm: Normal rate. Rhythm irregular.  Pulmonary:     Effort: Pulmonary effort is normal.  No respiratory distress.     Breath sounds: Normal breath sounds.  Abdominal:     General: Bowel sounds are normal.     Palpations: Abdomen is soft.     Tenderness: There is no abdominal tenderness.  Musculoskeletal:        General: Normal range of motion.     Cervical back: Normal range of motion and neck supple.     Right lower leg: No edema.     Left lower leg: No edema.  Lymphadenopathy:     Cervical: No cervical adenopathy.  Skin:    General: Skin is warm and dry.  Neurological:     Mental Status: He is alert and oriented to person, place, and time.  Psychiatric:        Behavior: Behavior normal.        Thought Content: Thought content normal.        Judgment: Judgment normal.    BP 140/83   Pulse (!) 59   Temp 98.2 F (36.8 C) (Temporal)   Resp 20   Ht 5' 10"  (1.778 m)   Wt 177 lb (80.3 kg)   SpO2 97%   BMI 25.40 kg/m    S/P bil ear irrigation- TM's normal     Assessment & Plan:  [name] [name] comes in today with chief complaint of Medical Management of Chronic Issues   Diagnosis and orders addressed:  1. Essential  hypertension Low sodium diet - CBC with Differential/Platelet - CMP14+EGFR - amLODipine (NORVASC) 10 MG tablet; Take 1 tablet (10 mg total) by mouth daily.  Dispense: 90 tablet; Refill: 1  2. Permanent atrial fibrillation ([facility]) Avoid caffein Report palpitation or any heart racing  3. Bilateral carotid artery stenosis  4. Mixed hyperlipidemia Low fat diet - Lipid panel - lisinopril (ZESTRIL) 40 MG tablet; Take 1 tablet (40 mg total) by mouth daily.  Dispense: 90 tablet; Refill: 1 - atorvastatin (LIPITOR) 40 MG tablet; Take 1 tablet (40 mg total) by mouth daily.  Dispense: 90 tablet; Refill: 1  5. Chronic obstructive pulmonary disease, unspecified COPD type ([place]) Report increasing SOB  6. Gastroesophageal reflux disease without esophagitis Avoid spicy foods Do not eat 2 hours prior to bedtime - pantoprazole (PROTONIX) 40 MG tablet; TAKE  (1)  TABLET TWICE A DAY.  Dispense: 180 tablet; Refill: 1  7. Hypothyroidism due to acquired atrophy of thyroid Lab pending - levothyroxine (SYNTHROID) 75 MCG tablet; Take 1 tablet (75 mcg total) by mouth daily.  Dispense: 90 tablet; Refill: 1  8. Vitamin D deficiency Continue vitamin d supplments  9. Benign prostatic hyperplasia without lower urinary tract symptoms Continue follow up with urology  10. BMI 26.0-26.9,adult Discussed diet and exercise for person with BMI >25 Will recheck weight in 3-6 months  11. Cerumen impaction Debrox 2-3 x a week Do not ue qtips in ear   Labs pending Health Maintenance reviewed Diet and exercise encouraged  Follow up plan: 6 months    [name] Hassell Done, FNP

## 2019-11-25 LAB — CMP14+EGFR
ALT: 25 IU/L (ref 0–44)
AST: 35 IU/L (ref 0–40)
Albumin/Globulin Ratio: 1.3 (ref 1.2–2.2)
Albumin: 4.3 g/dL (ref 3.7–4.7)
Alkaline Phosphatase: 75 IU/L (ref 48–121)
BUN/Creatinine Ratio: 11 (ref 10–24)
BUN: 12 mg/dL (ref 8–27)
Bilirubin Total: 0.6 mg/dL (ref 0.0–1.2)
CO2: 24 mmol/L (ref 20–29)
Calcium: 9.2 mg/dL (ref 8.6–10.2)
Chloride: 106 mmol/L (ref 96–106)
Creatinine, Ser: 1.06 mg/dL (ref 0.76–1.27)
GFR calc Af Amer: 77 mL/min/{1.73_m2} (ref >59)
GFR calc non Af Amer: 66 mL/min/{1.73_m2} (ref >59)
Globulin, Total: 3.2 g/dL (ref 1.5–4.5)
Glucose: 106 mg/dL — ABNORMAL HIGH (ref 65–99)
Potassium: 4.4 mmol/L (ref 3.5–5.2)
Sodium: 143 mmol/L (ref 134–144)
Total Protein: 7.5 g/dL (ref 6.0–8.5)

## 2019-11-25 LAB — CBC WITH DIFFERENTIAL/PLATELET
Basophils Absolute: 0.1 10*3/uL (ref 0.0–0.2)
Basos: 1 %
EOS (ABSOLUTE): 0.2 10*3/uL (ref 0.0–0.4)
Eos: 3 %
Hematocrit: 37.1 % — ABNORMAL LOW (ref 37.5–51.0)
Hemoglobin: 12.6 g/dL — ABNORMAL LOW (ref 13.0–17.7)
Immature Grans (Abs): 0 10*3/uL (ref 0.0–0.1)
Immature Granulocytes: 0 %
Lymphocytes Absolute: 1.3 10*3/uL (ref 0.7–3.1)
Lymphs: 27 %
MCH: 32.2 pg (ref 26.6–33.0)
MCHC: 34 g/dL (ref 31.5–35.7)
MCV: 95 fL (ref 79–97)
Monocytes Absolute: 0.4 10*3/uL (ref 0.1–0.9)
Monocytes: 9 %
Neutrophils Absolute: 2.9 10*3/uL (ref 1.4–7.0)
Neutrophils: 60 %
Platelets: 150 10*3/uL (ref 150–450)
RBC: 3.91 x10E6/uL — ABNORMAL LOW (ref 4.14–5.80)
RDW: 12.6 % (ref 11.6–15.4)
WBC: 4.8 10*3/uL (ref 3.4–10.8)

## 2019-11-25 LAB — LIPID PANEL
Chol/HDL Ratio: 3.4 ratio (ref 0.0–5.0)
Cholesterol, Total: 91 mg/dL — ABNORMAL LOW (ref 100–199)
HDL: 27 mg/dL — ABNORMAL LOW (ref >39)
LDL Chol Calc (NIH): 50 mg/dL (ref 0–99)
Triglycerides: 59 mg/dL (ref 0–149)
VLDL Cholesterol Cal: 14 mg/dL (ref 5–40)

## 2019-12-10 ENCOUNTER — Other Ambulatory Visit: Payer: Self-pay | Admitting: Nurse Practitioner

## 2019-12-10 DIAGNOSIS — I5031 Acute diastolic (congestive) heart failure: Secondary | ICD-10-CM

## 2020-03-13 ENCOUNTER — Ambulatory Visit (INDEPENDENT_AMBULATORY_CARE_PROVIDER_SITE_OTHER): Payer: Medicare Other | Admitting: Urology

## 2020-03-13 ENCOUNTER — Other Ambulatory Visit: Payer: Self-pay

## 2020-03-13 ENCOUNTER — Encounter: Payer: Self-pay | Admitting: Urology

## 2020-03-13 VITALS — BP 136/71 | HR 73 | Temp 98.2°F | Ht 70.0 in | Wt 177.0 lb

## 2020-03-13 DIAGNOSIS — C678 Malignant neoplasm of overlapping sites of bladder: Secondary | ICD-10-CM

## 2020-03-13 LAB — MICROSCOPIC EXAMINATION: Renal Epithel, UA: NONE SEEN /hpf

## 2020-03-13 LAB — URINALYSIS, ROUTINE W REFLEX MICROSCOPIC
Bilirubin, UA: NEGATIVE
Glucose, UA: NEGATIVE
Nitrite, UA: NEGATIVE
Specific Gravity, UA: 1.02 (ref 1.005–1.030)
Urobilinogen, Ur: 1 mg/dL (ref 0.2–1.0)
pH, UA: 7 (ref 5.0–7.5)

## 2020-03-13 MED ORDER — CIPROFLOXACIN HCL 500 MG PO TABS
500.0000 mg | ORAL_TABLET | Freq: Once | ORAL | Status: AC
  Administered 2020-03-13: 500 mg via ORAL

## 2020-03-13 NOTE — Progress Notes (Signed)
Urological Symptom Review  Patient is experiencing the following symptoms: Burning/pain with urination   Review of Systems  Gastrointestinal (upper)  : Negative for upper GI symptoms  Gastrointestinal (lower) : Negative for lower GI symptoms  Constitutional : Negative for symptoms  Skin: Negative for skin symptoms  Eyes: Negative for eye symptoms  Ear/Nose/Throat : Sinus problems  Hematologic/Lymphatic: Negative for Hematologic/Lymphatic symptoms  Cardiovascular : Leg swelling  Respiratory : Cough  Endocrine: Negative for endocrine symptoms  Musculoskeletal: Back pain Joint pain  Neurological: Negative for neurological symptoms  Psychologic: Negative for psychiatric symptoms

## 2020-03-13 NOTE — Progress Notes (Signed)
H&P  Chief Complaint: Follow-up of bladder cancer  History of Present Illness: This gentleman comes in today for cystoscopy.  He has had no gross hematuria recently.  He did have some burning with urination 2 to 3 weeks ago but that has cleared.  He does complain of nocturia every hour or 2.  He does drink a fair amount of fluids in the afternoon and evening.  Prior history:   He underwent emergent TURBT on 12.15.2017 for a large bladder cancer burden at his right bladder neck, posterior dome and left lateral wall. He did well after the procedure. He apparently been bleeding for 3 months. He is a cigarette smoker.   Pathology revealed high-grade, NMIBC. Muscle was present in the specimen.   He underwent repeat resection on 1.30.2018. 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   He was initiated on BCG and competed his 6 induction treatments on 3.27.2018.   5.15.2018 -he was found to have a small papillary recurrence on his anterior bladder wall--he underwent cysto/cauterization of this on 5.17.2018.  7.31.2018. 1st 3 maintenance BCG's completed  9.18.2018: Office cysto negative  10.24.2018: completed 3 maintenance BCG Rx's  1.22.2019: Surveillance cystoscopy.  2.19.2019: Completed 3 maintenance BCG treatments  4.2.2019: He has had no blood or burning with urination since his BCG has been completed. He tolerated the BCG well.  2.25.2020--Cysto--small BT recurrence.   3.12.2020: TURBT/bilateral RGPs. Cytologies of renal washings nml. Bladder tumor LG NMIBC.   6.2.2020: Cysto negative.   10.6.2020: Cystoscopy negative  3.2021: Cystoscopy negative, cytology with atypia   Past Medical History:  Diagnosis Date  . AAA (abdominal aortic aneurysm) ([place])   . Aortic stenosis, severe    a. 02/2017: s/p TAVR with Edwards Sapien 3 THV (size 26 mm, model # 9600TFX, serial # [id number])  . Arthritis   . Atherosclerotic peripheral vascular  disease ([place])   . Atrial fibrillation, persistent ([place])    a. not a candidate for [place] given hx of GI bleed  . Bilateral carotid artery stenosis   . Bladder cancer ([place])   . COPD (chronic obstructive pulmonary disease) ([place])   . Gastroesophageal reflux disease without esophagitis 05/25/2019  . Hyperlipidemia   . Hypertension   . Hypothyroidism   . Lung cancer ([place])    Right  . Lung mass    Right  . Skin cancer   . Vitamin D deficiency     Past Surgical History:  Procedure Laterality Date  . ABDOMINAL AORTIC ANEURYSM REPAIR  08-09-09  . BACK SURGERY    . CYSTOSCOPY W/ RETROGRADES Bilateral 09/02/2018   Procedure: CYSTOSCOPY WITH RETROGRADE PYELOGRAM;  Surgeon: Franchot Gallo, MD;  [facility]: [facility];  Service: Urology;  Laterality: Bilateral;  . CYSTOSCOPY WITH BIOPSY N/A 09/02/2018   Procedure: CYSTOSCOPY WITH BIOPSY;  Surgeon: Franchot Gallo, MD;  Location: [facility];  Service: Urology;  Laterality: N/A;  . ESOPHAGOGASTRODUODENOSCOPY N/A 02/21/2017   Procedure: ESOPHAGOGASTRODUODENOSCOPY (EGD);  Surgeon: Jerene Bears, MD;  Location[facility];  Service: Gastroenterology;  Laterality: N/A;  . HYDROCELE EXCISION / REPAIR    . LOBECTOMY Right 03/30/2017   Procedure: RIGHT UPPER LOBECTOMY LUNG;  Surgeon: Melrose Nakayama, MD;  Location: [place];  Service: Thoracic;  Laterality: Right;  . SKIN CANCER EXCISION    . SPINE SURGERY    . TEE WITHOUT CARDIOVERSION N/A 02/24/2017   Procedure: TRANSESOPHAGEAL ECHOCARDIOGRAM (TEE);  Surgeon: Sherren Mocha, MD;  Location: [place];  Service: Open  Heart Surgery;  Laterality: N/A;  . TRANSCATHETER AORTIC VALVE REPLACEMENT, TRANSFEMORAL N/A 02/24/2017   Procedure: TRANSCATHETER AORTIC VALVE REPLACEMENT, TRANSFEMORAL using a 63mm Edwards Sapien 3 Aortic Valve;  Surgeon: Sherren Mocha, MD;  Location: [place];  Service: Open Heart Surgery;  Laterality: N/A;  . TRANSURETHRAL RESECTION OF BLADDER TUMOR N/A  06/06/2016   Procedure: TRANSURETHRAL RESECTION OF BLADDER TUMOR (TURBT);  Surgeon: Franchot Gallo, MD;  [facility];  Service: Urology;  Laterality: N/A;  . TRANSURETHRAL RESECTION OF BLADDER TUMOR N/A 07/22/2016   Procedure: TRANSURETHRAL RESECTION OF BLADDER TUMOR (TURBT);  Surgeon: Franchot Gallo, MD;  Location: [facility];  Service: Urology;  Laterality: N/A;  . VIDEO ASSISTED THORACOSCOPY (VATS)/WEDGE RESECTION Right 03/30/2017   Procedure: VIDEO ASSISTED THORACOSCOPY (VATS)/WEDGE RESECTION;  Surgeon: Melrose Nakayama, MD;  Location: [place];  Service: Thoracic;  Laterality: Right;    Home Medications:  Allergies as of 03/13/2020      Reactions   Lovenox [enoxaparin]    HIT panel ordered 10/10      Medication List       Accurate as of March 13, 2020  9:34 AM. If you have any questions, ask your nurse or doctor.        alfuzosin 10 MG 24 hr tablet Commonly known as: UROXATRAL Take 1 tablet (10 mg total) by mouth at bedtime.   amLODipine 10 MG tablet Commonly known as: NORVASC Take 1 tablet (10 mg total) by mouth daily.   atorvastatin 40 MG tablet Commonly known as: LIPITOR Take 1 tablet (40 mg total) by mouth daily.   cholecalciferol 1000 units tablet Commonly known as: VITAMIN D Take 1 tablet (1,000 Units total) by mouth daily.   Eliquis 5 MG Tabs tablet Generic drug: apixaban TAKE  (1)  TABLET TWICE A DAY.   ferrous gluconate 324 MG tablet Commonly known as: FERGON 1 po BID   Fish Oil 1200 MG Caps Take 2,400 mg by mouth daily.   levothyroxine 75 MCG tablet Commonly known as: SYNTHROID Take 1 tablet (75 mcg total) by mouth daily.   lisinopril 40 MG tablet Commonly known as: ZESTRIL Take 1 tablet (40 mg total) by mouth daily.   metoprolol succinate 25 MG 24 hr tablet Commonly known as: Toprol XL Take 0.5 tablets (12.5 mg total) by mouth daily.   ONE-A-DAY MENS PO Take 1 tablet by mouth daily.   pantoprazole 40 MG tablet Commonly known  as: PROTONIX TAKE  (1)  TABLET TWICE A DAY.       Allergies:  Allergies  Allergen Reactions  . Lovenox [Enoxaparin]     HIT panel ordered 10/10    Family History  Problem Relation Age of Onset  . Heart disease Father        Heart Disease before age 85  . Alcohol abuse Father   . Heart attack Father   . Cancer Mother        Lung  . COPD Mother   . COPD Sister   . Lupus Sister   . Hypertension Brother   . Gout Brother   . Heart attack Son   . Gout Brother   . Prostatitis Brother     Social History:  reports that he quit smoking about 14 years ago. His smoking use included cigarettes. He started smoking about 65 years ago. He has a 51.00 pack-year smoking history. He has never used smokeless tobacco. He reports that he does not drink alcohol and does not use drugs.  ROS: A  complete review of systems was performed.  All systems are negative except for pertinent findings as noted.  Physical Exam:  Vital signs in last 24 hours: BP 136/71   Pulse 73   Temp 98.2 F (36.8 C)   Ht 5\' 10"  (1.778 m)   Wt 177 lb (80.3 kg)   BMI 25.40 kg/m  Constitutional:  Alert and oriented, No acute distress Cardiovascular: Regular rate  Respiratory: Normal respiratory effort Lymphatic: No lymphadenopathy Neurologic: Grossly intact, no focal deficits Psychiatric: Normal mood and affect  I have reviewed prior pt notes  I have reviewed notes from referring/previous physicians  I have reviewed urinalysis results  I have independently reviewed prior imaging  Cystoscopy Procedure Note:  Indication: Follow-up of bladder cancer  After informed consent and discussion of the procedure and its risks, [name] [name] was positioned and prepped in the standard fashion.  Cystoscopy was performed with a flexible cystoscope.   Findings: Urethra: Normal Prostate: Minimally obstructive Bladder neck: Papillary lesions at the left bladder neck Ureteral orifices: Normal Bladder: Besides  bladder neck, no lesions seen  The patient tolerated the procedure well.    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  Cystoscopy was covered with Cipro  2.  I will proceed with cystoscopy, bilateral retrogrades, TURBT/gemcitabine in the near future  3.  Will need clearance from cardiologist to come off of his Eliquis.

## 2020-03-14 ENCOUNTER — Telehealth: Payer: Self-pay | Admitting: Family Medicine

## 2020-03-14 NOTE — Telephone Encounter (Signed)
   [facility] Pre-operative Risk Assessment    HEARTCARE STAFF: - Please ensure there is not already an duplicate clearance open for this procedure. - Under Visit Info/Reason for Call, type in Other and utilize the format Clearance MM/DD/YY or Clearance TBD. Do not use dashes or single digits. - If request is for dental extraction, please clarify the # of teeth to be extracted.  Request for surgical clearance:  1. What type of surgery is being performed? Cystoscopy with bilateral retrograde and Transurethral resection bladder tumor   2. When is this surgery scheduled? 03/20/2020  3. What type of clearance is required (medical clearance vs. Pharmacy clearance to hold med vs. Both)? Pharmacy   4. Are there any medications that need to be held prior to surgery and how long? stop Eliquis prior to surgery    5. Practice name and name of physician performing surgery? Dr. Franchot Gallo   6. What is the office phone number? [phone]   7.   What is the office fax number? [phone]  8.   Anesthesia type (None, local, MAC, general) General    Vicky T Slaughter 03/14/2020, 3:15 PM  _________________________________________________________________   (provider comments below)

## 2020-03-14 NOTE — Telephone Encounter (Signed)
Patient with diagnosis of afib on Eliquis for anticoagulation.    Procedure: Cystoscopy with bilateral retrograde and Transurethral resection bladder tumor   Date of procedure: 03/20/20  CHADS2-VASc score of 4 (age x2, HTN, CAD)  CrCl 30mL/min Platelet count 150K  Per office protocol, patient can hold Eliquis for 2 days prior to procedure.

## 2020-03-14 NOTE — Patient Instructions (Signed)
[name] [name]  03/14/2020     @PREFPERIOPPHARMACY @   Your procedure is scheduled on  03/20/2020.  Report to [facility] at  0700  A.M.  Call this number if you have problems the morning of surgery:  [phone]   Remember:  Do not eat or drink after midnight.                         Take these medicines the morning of surgery with A SIP OF WATER  Amlodipine, levothyroxine, metoprolol, pantoprazole.    Do not wear jewelry, make-up or nail polish.  Do not wear lotions, powders, or perfumes. Please wear deodorant and brush your teeth.  Do not shave 48 hours prior to surgery.  Men may shave face and neck.  Do not bring valuables to the hospital.  [facility] is not responsible for any belongings or valuables.  Contacts, dentures or bridgework may not be worn into surgery.  Leave your suitcase in the car.  After surgery it may be brought to your room.  For patients admitted to the hospital, discharge time will be determined by your treatment team.  Patients discharged the day of surgery will not be allowed to drive home.   Name and phone number of your driver:   family Special instructions:  DO NOT smoke the morning of your procedure.  Please read over the following fact sheets that you were given. Anesthesia Post-op Instructions and Care and Recovery After Surgery       Transurethral Resection of Bladder Tumor, Care After This sheet gives you information about how to care for yourself after your procedure. Your health care provider may also give you more specific instructions. If you have problems or questions, contact your health care provider. What can I expect after the procedure? After the procedure, it is common to have:  A small amount of blood in your urine for up to 2 weeks.  Soreness or mild pain from your catheter. After your catheter is removed, you may have mild soreness, especially when urinating.  Pain in your lower abdomen. Follow these  instructions at home: Medicines   Take over-the-counter and prescription medicines only as told by your health care provider.  If you were prescribed an antibiotic medicine, take it as told by your health care provider. Do not stop taking the antibiotic even if you start to feel better.  Do not drive for 24 hours if you were given a sedative during your procedure.  Ask your health care provider if the medicine prescribed to you: ? Requires you to avoid driving or using heavy machinery. ? Can cause constipation. You may need to take these actions to prevent or treat constipation:  Take over-the-counter or prescription medicines.  Eat foods that are high in fiber, such as beans, whole grains, and fresh fruits and vegetables.  Limit foods that are high in fat and processed sugars, such as fried or sweet foods. Activity  Return to your normal activities as told by your health care provider. Ask your health care provider what activities are safe for you.  Do not lift anything that is heavier than 10 lb (4.5 kg), or the limit that you are told, until your health care provider says that it is safe.  Avoid intense physical activity for as long as told by your health care provider.  Rest as told by your health care provider.  Avoid sitting for a  long time without moving. Get up to take short walks every 1-2 hours. This is important to improve blood flow and breathing. Ask for help if you feel weak or unsteady. General instructions   Do not drink alcohol for as long as told by your health care provider. This is especially important if you are taking prescription pain medicines.  Do not take baths, swim, or use a hot tub until your health care provider approves. Ask your health care provider if you may take showers. You may only be allowed to take sponge baths.  If you have a catheter, follow instructions from your health care provider about caring for your catheter and your drainage  bag.  Drink enough fluid to keep your urine pale yellow.  Wear compression stockings as told by your health care provider. These stockings help to prevent blood clots and reduce swelling in your legs.  Keep all follow-up visits as told by your health care provider. This is important. ? You will need to be followed closely with regular checks of your bladder and urethra (cystoscopies) to make sure that the cancer does not come back. Contact a health care provider if:  You have pain that gets worse or does not improve with medicine.  You have blood in your urine for more than 2 weeks.  You have cloudy or bad-smelling urine.  You become constipated. Signs of constipation may include having: ? Fewer than three bowel movements in a week. ? Difficulty having a bowel movement. ? Stools that are dry, hard, or larger than normal.  You have a fever. Get help right away if:  You have: ? Severe pain. ? Bright red blood in your urine. ? Blood clots in your urine. ? A lot of blood in your urine.  Your catheter has been removed and you are not able to urinate.  You have a catheter in place and the catheter is not draining urine. Summary  After your procedure, it is common to have a small amount of blood in your urine, soreness or mild pain from your catheter, and pain in your lower abdomen.  Take over-the-counter and prescription medicines only as told by your health care provider.  Rest as told by your health care provider. Follow your health care provider's instructions about returning to normal activities. Ask what activities are safe for you.  If you have a catheter, follow instructions from your health care provider about caring for your catheter and your drainage bag.  Get help right away if you cannot urinate, you have severe pain, or you have bright red blood or blood clots in your urine. This information is not intended to replace advice given to you by your health care  provider. Make sure you discuss any questions you have with your health care provider. Document Revised: 01/07/2018 Document Reviewed: 01/07/2018 Elsevier Patient Education  2020 [place] Anesthesia, Adult, Care After This sheet gives you information about how to care for yourself after your procedure. Your health care provider may also give you more specific instructions. If you have problems or questions, contact your health care provider. What can I expect after the procedure? After the procedure, the following side effects are common:  Pain or discomfort at the IV site.  Nausea.  Vomiting.  Sore throat.  Trouble concentrating.  Feeling cold or chills.  Weak or tired.  Sleepiness and fatigue.  Soreness and body aches. These side effects can affect parts of the body that were not involved  in surgery. Follow these instructions at home:  For at least 24 hours after the procedure:  Have a responsible adult stay with you. It is important to have someone help care for you until you are awake and alert.  Rest as needed.  Do not: ? Participate in activities in which you could fall or become injured. ? Drive. ? Use heavy machinery. ? Drink alcohol. ? Take sleeping pills or medicines that cause drowsiness. ? Make important decisions or sign legal documents. ? Take care of children on your own. Eating and drinking  Follow any instructions from your health care provider about eating or drinking restrictions.  When you feel hungry, start by eating small amounts of foods that are soft and easy to digest (bland), such as toast. Gradually return to your regular diet.  Drink enough fluid to keep your urine pale yellow.  If you vomit, rehydrate by drinking water, juice, or clear broth. General instructions  If you have sleep apnea, surgery and certain medicines can increase your risk for breathing problems. Follow instructions from your health care provider about  wearing your sleep device: ? Anytime you are sleeping, including during daytime naps. ? While taking prescription pain medicines, sleeping medicines, or medicines that make you drowsy.  Return to your normal activities as told by your health care provider. Ask your health care provider what activities are safe for you.  Take over-the-counter and prescription medicines only as told by your health care provider.  If you smoke, do not smoke without supervision.  Keep all follow-up visits as told by your health care provider. This is important. Contact a health care provider if:  You have nausea or vomiting that does not get better with medicine.  You cannot eat or drink without vomiting.  You have pain that does not get better with medicine.  You are unable to pass urine.  You develop a skin rash.  You have a fever.  You have redness around your IV site that gets worse. Get help right away if:  You have difficulty breathing.  You have chest pain.  You have blood in your urine or stool, or you vomit blood. Summary  After the procedure, it is common to have a sore throat or nausea. It is also common to feel tired.  Have a responsible adult stay with you for the first 24 hours after general anesthesia. It is important to have someone help care for you until you are awake and alert.  When you feel hungry, start by eating small amounts of foods that are soft and easy to digest (bland), such as toast. Gradually return to your regular diet.  Drink enough fluid to keep your urine pale yellow.  Return to your normal activities as told by your health care provider. Ask your health care provider what activities are safe for you. This information is not intended to replace advice given to you by your health care provider. Make sure you discuss any questions you have with your health care provider. Document Revised: 06/12/2017 Document Reviewed: 01/23/2017 Elsevier Patient Education   [place].

## 2020-03-14 NOTE — Telephone Encounter (Signed)
Pharm this is not complete thanks

## 2020-03-16 ENCOUNTER — Other Ambulatory Visit: Payer: Self-pay

## 2020-03-16 ENCOUNTER — Encounter (HOSPITAL_COMMUNITY): Payer: Self-pay

## 2020-03-16 ENCOUNTER — Encounter (HOSPITAL_COMMUNITY)
Admission: RE | Admit: 2020-03-16 | Discharge: 2020-03-16 | Disposition: A | Discharge status: Home / Self Care | Payer: Medicare Other | Source: Ambulatory Visit | Attending: Urology | Admitting: Urology

## 2020-03-16 ENCOUNTER — Other Ambulatory Visit (HOSPITAL_COMMUNITY)
Admission: RE | Admit: 2020-03-16 | Discharge: 2020-03-16 | Disposition: A | Discharge status: Home / Self Care | Payer: Medicare Other | Source: Ambulatory Visit | Attending: Urology | Admitting: Urology

## 2020-03-16 DIAGNOSIS — Z20822 Contact with and (suspected) exposure to covid-19: Secondary | ICD-10-CM | POA: Diagnosis not present

## 2020-03-16 DIAGNOSIS — Z01812 Encounter for preprocedural laboratory examination: Secondary | ICD-10-CM | POA: Diagnosis not present

## 2020-03-16 LAB — CBC WITH DIFFERENTIAL/PLATELET
Abs Immature Granulocytes: 0.01 10*3/uL (ref 0.00–0.07)
Basophils Absolute: 0 10*3/uL (ref 0.0–0.1)
Basophils Relative: 0 %
Eosinophils Absolute: 0.1 10*3/uL (ref 0.0–0.5)
Eosinophils Relative: 2 %
HCT: 40.3 % (ref 39.0–52.0)
Hemoglobin: 13.2 g/dL (ref 13.0–17.0)
Immature Granulocytes: 0 %
Lymphocytes Relative: 22 %
Lymphs Abs: 1.4 10*3/uL (ref 0.7–4.0)
MCH: 31.4 pg (ref 26.0–34.0)
MCHC: 32.8 g/dL (ref 30.0–36.0)
MCV: 96 fL (ref 80.0–100.0)
Monocytes Absolute: 0.5 10*3/uL (ref 0.1–1.0)
Monocytes Relative: 7 %
Neutro Abs: 4.2 10*3/uL (ref 1.7–7.7)
Neutrophils Relative %: 69 %
Platelets: 164 10*3/uL (ref 150–400)
RBC: 4.2 MIL/uL — ABNORMAL LOW (ref 4.22–5.81)
RDW: 13.1 % (ref 11.5–15.5)
WBC: 6.2 10*3/uL (ref 4.0–10.5)
nRBC: 0 % (ref 0.0–0.2)

## 2020-03-16 LAB — BASIC METABOLIC PANEL
Anion gap: 9 (ref 5–15)
BUN: 17 mg/dL (ref 8–23)
CO2: 25 mmol/L (ref 22–32)
Calcium: 9.2 mg/dL (ref 8.9–10.3)
Chloride: 104 mmol/L (ref 98–111)
Creatinine, Ser: 0.96 mg/dL (ref 0.61–1.24)
GFR calc Af Amer: >60 mL/min (ref >60)
GFR calc non Af Amer: >60 mL/min (ref >60)
Glucose, Bld: 108 mg/dL — ABNORMAL HIGH (ref 70–99)
Potassium: 4.3 mmol/L (ref 3.5–5.1)
Sodium: 138 mmol/L (ref 135–145)

## 2020-03-16 LAB — SARS CORONAVIRUS 2 (TAT 6-24 HRS): SARS Coronavirus 2: NEGATIVE

## 2020-03-16 NOTE — Pre-Procedure Instructions (Signed)
Spoke with Jabier Mutton. Notified her of Gemzar 2,000mg  needed for surgery 03/20/2020. She states to release order on Tuesday morning when patient arrives.

## 2020-03-19 ENCOUNTER — Other Ambulatory Visit: Payer: Self-pay

## 2020-03-19 ENCOUNTER — Ambulatory Visit (HOSPITAL_COMMUNITY)
Admission: RE | Admit: 2020-03-19 | Discharge: 2020-03-19 | Disposition: A | Discharge status: Home / Self Care | Payer: Medicare Other | Source: Ambulatory Visit | Attending: Internal Medicine | Admitting: Internal Medicine

## 2020-03-19 ENCOUNTER — Telehealth: Payer: Self-pay | Admitting: *Deleted

## 2020-03-19 ENCOUNTER — Inpatient Hospital Stay: Payer: Medicare Other | Attending: Internal Medicine

## 2020-03-19 ENCOUNTER — Encounter (HOSPITAL_COMMUNITY): Payer: Self-pay

## 2020-03-19 DIAGNOSIS — I2510 Atherosclerotic heart disease of native coronary artery without angina pectoris: Secondary | ICD-10-CM | POA: Diagnosis not present

## 2020-03-19 DIAGNOSIS — E039 Hypothyroidism, unspecified: Secondary | ICD-10-CM | POA: Diagnosis not present

## 2020-03-19 DIAGNOSIS — E785 Hyperlipidemia, unspecified: Secondary | ICD-10-CM | POA: Diagnosis not present

## 2020-03-19 DIAGNOSIS — C3411 Malignant neoplasm of upper lobe, right bronchus or lung: Secondary | ICD-10-CM | POA: Insufficient documentation

## 2020-03-19 DIAGNOSIS — Z79899 Other long term (current) drug therapy: Secondary | ICD-10-CM | POA: Diagnosis not present

## 2020-03-19 DIAGNOSIS — I4819 Other persistent atrial fibrillation: Secondary | ICD-10-CM | POA: Diagnosis not present

## 2020-03-19 DIAGNOSIS — I10 Essential (primary) hypertension: Secondary | ICD-10-CM | POA: Insufficient documentation

## 2020-03-19 DIAGNOSIS — J449 Chronic obstructive pulmonary disease, unspecified: Secondary | ICD-10-CM | POA: Diagnosis not present

## 2020-03-19 DIAGNOSIS — Z8551 Personal history of malignant neoplasm of bladder: Secondary | ICD-10-CM | POA: Diagnosis not present

## 2020-03-19 DIAGNOSIS — Z902 Acquired absence of lung [part of]: Secondary | ICD-10-CM | POA: Diagnosis not present

## 2020-03-19 DIAGNOSIS — C3490 Malignant neoplasm of unspecified part of unspecified bronchus or lung: Secondary | ICD-10-CM

## 2020-03-19 DIAGNOSIS — J432 Centrilobular emphysema: Secondary | ICD-10-CM | POA: Diagnosis not present

## 2020-03-19 DIAGNOSIS — K219 Gastro-esophageal reflux disease without esophagitis: Secondary | ICD-10-CM | POA: Insufficient documentation

## 2020-03-19 DIAGNOSIS — Z85828 Personal history of other malignant neoplasm of skin: Secondary | ICD-10-CM | POA: Insufficient documentation

## 2020-03-19 DIAGNOSIS — I700 Atherosclerosis of aorta: Secondary | ICD-10-CM | POA: Diagnosis not present

## 2020-03-19 LAB — CBC WITH DIFFERENTIAL (CANCER CENTER ONLY)
Abs Immature Granulocytes: 0.02 10*3/uL (ref 0.00–0.07)
Basophils Absolute: 0.1 10*3/uL (ref 0.0–0.1)
Basophils Relative: 1 %
Eosinophils Absolute: 0.1 10*3/uL (ref 0.0–0.5)
Eosinophils Relative: 1 %
HCT: 39.8 % (ref 39.0–52.0)
Hemoglobin: 13.4 g/dL (ref 13.0–17.0)
Immature Granulocytes: 0 %
Lymphocytes Relative: 17 %
Lymphs Abs: 1.5 10*3/uL (ref 0.7–4.0)
MCH: 31.7 pg (ref 26.0–34.0)
MCHC: 33.7 g/dL (ref 30.0–36.0)
MCV: 94.1 fL (ref 80.0–100.0)
Monocytes Absolute: 0.6 10*3/uL (ref 0.1–1.0)
Monocytes Relative: 7 %
Neutro Abs: 6.5 10*3/uL (ref 1.7–7.7)
Neutrophils Relative %: 74 %
Platelet Count: 148 10*3/uL — ABNORMAL LOW (ref 150–400)
RBC: 4.23 MIL/uL (ref 4.22–5.81)
RDW: 13.2 % (ref 11.5–15.5)
WBC Count: 8.7 10*3/uL (ref 4.0–10.5)
nRBC: 0 % (ref 0.0–0.2)

## 2020-03-19 LAB — CMP (CANCER CENTER ONLY)
ALT: 22 U/L (ref 0–44)
AST: 27 U/L (ref 15–41)
Albumin: 4.1 g/dL (ref 3.5–5.0)
Alkaline Phosphatase: 63 U/L (ref 38–126)
Anion gap: 7 (ref 5–15)
BUN: 15 mg/dL (ref 8–23)
CO2: 27 mmol/L (ref 22–32)
Calcium: 9.6 mg/dL (ref 8.9–10.3)
Chloride: 107 mmol/L (ref 98–111)
Creatinine: 1.23 mg/dL (ref 0.61–1.24)
GFR, Est AFR Am: >60 mL/min (ref >60)
GFR, Estimated: 55 mL/min — ABNORMAL LOW (ref >60)
Glucose, Bld: 112 mg/dL — ABNORMAL HIGH (ref 70–99)
Potassium: 4.5 mmol/L (ref 3.5–5.1)
Sodium: 141 mmol/L (ref 135–145)
Total Bilirubin: 1 mg/dL (ref 0.3–1.2)
Total Protein: 8.1 g/dL (ref 6.5–8.1)

## 2020-03-19 MED ORDER — IOHEXOL 300 MG/ML  SOLN
75.0000 mL | Freq: Once | INTRAMUSCULAR | Status: AC | PRN
  Administered 2020-03-19: 75 mL via INTRAVENOUS

## 2020-03-19 NOTE — Telephone Encounter (Signed)
Received call from patient to confirm his telephone appt with Dr. Julien Nordmann on Wednesday, 03/21/20 Confirmed this appt with pt

## 2020-03-20 ENCOUNTER — Other Ambulatory Visit: Payer: Self-pay

## 2020-03-20 ENCOUNTER — Ambulatory Visit (HOSPITAL_COMMUNITY): Payer: Medicare Other

## 2020-03-20 ENCOUNTER — Ambulatory Visit (HOSPITAL_COMMUNITY)
Admission: RE | Admit: 2020-03-20 | Discharge: 2020-03-20 | Disposition: A | Discharge status: Home / Self Care | Payer: Medicare Other | Attending: Urology | Admitting: Urology

## 2020-03-20 ENCOUNTER — Ambulatory Visit (HOSPITAL_COMMUNITY): Payer: Medicare Other | Admitting: Anesthesiology

## 2020-03-20 ENCOUNTER — Encounter (HOSPITAL_COMMUNITY): Payer: Self-pay | Admitting: Urology

## 2020-03-20 ENCOUNTER — Encounter (HOSPITAL_COMMUNITY)
Admission: RE | Disposition: A | Discharge status: Home / Self Care | Payer: Self-pay | Source: Home / Self Care | Attending: Urology

## 2020-03-20 DIAGNOSIS — Z801 Family history of malignant neoplasm of trachea, bronchus and lung: Secondary | ICD-10-CM | POA: Insufficient documentation

## 2020-03-20 DIAGNOSIS — I10 Essential (primary) hypertension: Secondary | ICD-10-CM | POA: Insufficient documentation

## 2020-03-20 DIAGNOSIS — I350 Nonrheumatic aortic (valve) stenosis: Secondary | ICD-10-CM | POA: Insufficient documentation

## 2020-03-20 DIAGNOSIS — I4891 Unspecified atrial fibrillation: Secondary | ICD-10-CM | POA: Diagnosis not present

## 2020-03-20 DIAGNOSIS — J449 Chronic obstructive pulmonary disease, unspecified: Secondary | ICD-10-CM | POA: Diagnosis not present

## 2020-03-20 DIAGNOSIS — C679 Malignant neoplasm of bladder, unspecified: Secondary | ICD-10-CM | POA: Diagnosis not present

## 2020-03-20 DIAGNOSIS — Z85828 Personal history of other malignant neoplasm of skin: Secondary | ICD-10-CM | POA: Diagnosis not present

## 2020-03-20 DIAGNOSIS — Z8249 Family history of ischemic heart disease and other diseases of the circulatory system: Secondary | ICD-10-CM | POA: Insufficient documentation

## 2020-03-20 DIAGNOSIS — Z85118 Personal history of other malignant neoplasm of bronchus and lung: Secondary | ICD-10-CM | POA: Diagnosis not present

## 2020-03-20 DIAGNOSIS — D494 Neoplasm of unspecified behavior of bladder: Secondary | ICD-10-CM | POA: Diagnosis not present

## 2020-03-20 DIAGNOSIS — I493 Ventricular premature depolarization: Secondary | ICD-10-CM | POA: Diagnosis not present

## 2020-03-20 DIAGNOSIS — Z902 Acquired absence of lung [part of]: Secondary | ICD-10-CM | POA: Insufficient documentation

## 2020-03-20 DIAGNOSIS — M1990 Unspecified osteoarthritis, unspecified site: Secondary | ICD-10-CM | POA: Insufficient documentation

## 2020-03-20 DIAGNOSIS — Z8551 Personal history of malignant neoplasm of bladder: Secondary | ICD-10-CM | POA: Diagnosis not present

## 2020-03-20 DIAGNOSIS — N329 Bladder disorder, unspecified: Secondary | ICD-10-CM | POA: Diagnosis not present

## 2020-03-20 DIAGNOSIS — I4819 Other persistent atrial fibrillation: Secondary | ICD-10-CM | POA: Diagnosis not present

## 2020-03-20 DIAGNOSIS — N3090 Cystitis, unspecified without hematuria: Secondary | ICD-10-CM | POA: Diagnosis not present

## 2020-03-20 DIAGNOSIS — Z87891 Personal history of nicotine dependence: Secondary | ICD-10-CM | POA: Diagnosis not present

## 2020-03-20 HISTORY — PX: TRANSURETHRAL RESECTION OF BLADDER TUMOR: SHX2575

## 2020-03-20 HISTORY — PX: CYSTOSCOPY W/ RETROGRADES: SHX1426

## 2020-03-20 SURGERY — CYSTOSCOPY, WITH RETROGRADE PYELOGRAM
Anesthesia: General

## 2020-03-20 MED ORDER — DEXAMETHASONE SODIUM PHOSPHATE 4 MG/ML IJ SOLN
INTRAMUSCULAR | Status: DC | PRN
  Administered 2020-03-20: 6 mg via INTRAVENOUS

## 2020-03-20 MED ORDER — CHLORHEXIDINE GLUCONATE 0.12 % MT SOLN
15.0000 mL | Freq: Once | OROMUCOSAL | Status: AC
  Administered 2020-03-20: 15 mL via OROMUCOSAL

## 2020-03-20 MED ORDER — OXYBUTYNIN CHLORIDE 5 MG PO TABS: 5.0000 mg | ORAL_TABLET | Freq: Three times a day (TID) | ORAL | 0 refills | Status: DC | PRN

## 2020-03-20 MED ORDER — CEFAZOLIN SODIUM-DEXTROSE 2-4 GM/100ML-% IV SOLN
2.0000 g | Freq: Once | INTRAVENOUS | Status: AC
  Administered 2020-03-20: 2 g via INTRAVENOUS

## 2020-03-20 MED ORDER — GLYCOPYRROLATE PF 0.2 MG/ML IJ SOSY
PREFILLED_SYRINGE | INTRAMUSCULAR | Status: AC
  Filled 2020-03-20: qty 1

## 2020-03-20 MED ORDER — LACTATED RINGERS IV SOLN: INTRAVENOUS | Status: DC | PRN

## 2020-03-20 MED ORDER — ORAL CARE MOUTH RINSE: 15.0000 mL | Freq: Once | OROMUCOSAL | Status: AC

## 2020-03-20 MED ORDER — GLYCOPYRROLATE PF 0.2 MG/ML IJ SOSY
PREFILLED_SYRINGE | INTRAMUSCULAR | Status: DC | PRN
  Administered 2020-03-20: .1 mg via INTRAVENOUS

## 2020-03-20 MED ORDER — ONDANSETRON HCL 4 MG/2ML IJ SOLN
INTRAMUSCULAR | Status: AC
  Filled 2020-03-20: qty 2

## 2020-03-20 MED ORDER — PROPOFOL 10 MG/ML IV BOLUS
INTRAVENOUS | Status: DC | PRN
  Administered 2020-03-20: 100 mg via INTRAVENOUS
  Administered 2020-03-20 (×2): 20 mg via INTRAVENOUS

## 2020-03-20 MED ORDER — LACTATED RINGERS IV SOLN: Freq: Once | INTRAVENOUS | Status: DC

## 2020-03-20 MED ORDER — EPHEDRINE 5 MG/ML INJ
INTRAVENOUS | Status: AC
  Filled 2020-03-20: qty 10

## 2020-03-20 MED ORDER — DIATRIZOATE MEGLUMINE 30 % UR SOLN
URETHRAL | Status: AC
  Filled 2020-03-20: qty 100

## 2020-03-20 MED ORDER — HYDROMORPHONE HCL 1 MG/ML IJ SOLN
0.2500 mg | INTRAMUSCULAR | Status: DC | PRN
  Administered 2020-03-20 (×2): 0.5 mg via INTRAVENOUS
  Filled 2020-03-20 (×2): qty 0.5

## 2020-03-20 MED ORDER — FENTANYL CITRATE (PF) 100 MCG/2ML IJ SOLN
INTRAMUSCULAR | Status: DC | PRN
Start: 2020-03-20 — End: 2020-03-20
  Administered 2020-03-20: 50 ug via INTRAVENOUS

## 2020-03-20 MED ORDER — WATER FOR IRRIGATION, STERILE IR SOLN
Status: DC | PRN
  Administered 2020-03-20: 500 mL
  Administered 2020-03-20 (×2): 3000 mL

## 2020-03-20 MED ORDER — PHENYLEPHRINE 40 MCG/ML (10ML) SYRINGE FOR IV PUSH (FOR BLOOD PRESSURE SUPPORT)
PREFILLED_SYRINGE | INTRAVENOUS | Status: AC
  Filled 2020-03-20: qty 10

## 2020-03-20 MED ORDER — FENTANYL CITRATE (PF) 100 MCG/2ML IJ SOLN
INTRAMUSCULAR | Status: AC
  Filled 2020-03-20: qty 2

## 2020-03-20 MED ORDER — LIDOCAINE 2% (20 MG/ML) 5 ML SYRINGE
INTRAMUSCULAR | Status: DC | PRN
  Administered 2020-03-20: 100 mg via INTRAVENOUS

## 2020-03-20 MED ORDER — CEFAZOLIN SODIUM-DEXTROSE 2-4 GM/100ML-% IV SOLN
INTRAVENOUS | Status: AC
  Filled 2020-03-20: qty 100

## 2020-03-20 MED ORDER — ONDANSETRON HCL 4 MG/2ML IJ SOLN
INTRAMUSCULAR | Status: DC | PRN
  Administered 2020-03-20: 4 mg via INTRAVENOUS

## 2020-03-20 MED ORDER — ONDANSETRON HCL 4 MG/2ML IJ SOLN: 4.0000 mg | Freq: Once | INTRAMUSCULAR | Status: DC | PRN

## 2020-03-20 MED ORDER — DIATRIZOATE MEGLUMINE 30 % UR SOLN
URETHRAL | Status: DC | PRN
  Administered 2020-03-20: 19 mL

## 2020-03-20 MED ORDER — EPHEDRINE SULFATE-NACL 50-0.9 MG/10ML-% IV SOSY
PREFILLED_SYRINGE | INTRAVENOUS | Status: DC | PRN
  Administered 2020-03-20 (×3): 5 mg via INTRAVENOUS

## 2020-03-20 MED ORDER — PHENYLEPHRINE 40 MCG/ML (10ML) SYRINGE FOR IV PUSH (FOR BLOOD PRESSURE SUPPORT)
PREFILLED_SYRINGE | INTRAVENOUS | Status: DC | PRN
  Administered 2020-03-20: 80 ug via INTRAVENOUS
  Administered 2020-03-20: 120 ug via INTRAVENOUS
  Administered 2020-03-20 (×2): 80 ug via INTRAVENOUS

## 2020-03-20 MED ORDER — LIDOCAINE 2% (20 MG/ML) 5 ML SYRINGE
INTRAMUSCULAR | Status: AC
  Filled 2020-03-20: qty 5

## 2020-03-20 MED ORDER — DEXAMETHASONE SODIUM PHOSPHATE 10 MG/ML IJ SOLN
INTRAMUSCULAR | Status: AC
  Filled 2020-03-20: qty 1

## 2020-03-20 MED ORDER — GEMCITABINE CHEMO FOR BLADDER INSTILLATION 2000 MG
2000.0000 mg | Freq: Once | INTRAVENOUS | Status: AC
  Administered 2020-03-20: 2000 mg via INTRAVESICAL
  Filled 2020-03-20: qty 52.6

## 2020-03-20 MED ORDER — CEPHALEXIN 500 MG PO CAPS: 500.0000 mg | ORAL_CAPSULE | Freq: Two times a day (BID) | ORAL | 0 refills | Status: AC

## 2020-03-20 MED ORDER — PROPOFOL 10 MG/ML IV BOLUS
INTRAVENOUS | Status: AC
  Filled 2020-03-20: qty 20

## 2020-03-20 SURGICAL SUPPLY — 34 items
BAG DRAIN URO TABLE W/ADPT NS (BAG) ×4 IMPLANT
BAG DRN 8 ADPR NS SKTRN CSTL (BAG) ×2
CATH FOLEY 2WAY SLVR  5CC 18FR (CATHETERS) ×4
CATH FOLEY 2WAY SLVR 5CC 18FR (CATHETERS) IMPLANT
CATH INTERMIT  6FR 70CM (CATHETERS) ×4 IMPLANT
CLOTH BEACON ORANGE TIMEOUT ST (SAFETY) ×4 IMPLANT
DECANTER SPIKE VIAL GLASS SM (MISCELLANEOUS) ×4 IMPLANT
ELECT REM PT RETURN 9FT ADLT (ELECTROSURGICAL) ×4
ELECTRODE REM PT RTRN 9FT ADLT (ELECTROSURGICAL) ×2 IMPLANT
GLOVE BIO SURGEON STRL SZ8 (GLOVE) ×4 IMPLANT
GLOVE BIOGEL M 8.0 STRL (GLOVE) ×4 IMPLANT
GLOVE BIOGEL PI IND STRL 7.0 (GLOVE) ×4 IMPLANT
GLOVE BIOGEL PI INDICATOR 7.0 (GLOVE) ×4
GLOVE ECLIPSE 6.5 STRL STRAW (GLOVE) ×4 IMPLANT
GOWN STRL REUS W/TWL LRG LVL3 (GOWN DISPOSABLE) ×6 IMPLANT
GOWN STRL REUS W/TWL XL LVL3 (GOWN DISPOSABLE) ×4 IMPLANT
GUIDEWIRE STR DUAL SENSOR (WIRE) ×4 IMPLANT
KIT TURNOVER CYSTO (KITS) ×4 IMPLANT
LOOP MONOPOLAR YLW (ELECTROSURGICAL) ×4 IMPLANT
MANIFOLD NEPTUNE II (INSTRUMENTS) ×4 IMPLANT
NDL HYPO 18GX1.5 BLUNT FILL (NEEDLE) ×2 IMPLANT
NEEDLE HYPO 18GX1.5 BLUNT FILL (NEEDLE) ×4 IMPLANT
NS IRRIG 1000ML POUR BTL (IV SOLUTION) ×4 IMPLANT
NS IRRIG 500ML POUR BTL (IV SOLUTION) ×4 IMPLANT
PACK CYSTO (CUSTOM PROCEDURE TRAY) ×4 IMPLANT
PAD ARMBOARD 7.5X6 YLW CONV (MISCELLANEOUS) ×4 IMPLANT
PAD TELFA 3X4 1S STER (GAUZE/BANDAGES/DRESSINGS) ×4 IMPLANT
PLUG CATH AND CAP STER (CATHETERS) ×4 IMPLANT
SYR CONTROL 10ML LL (SYRINGE) ×2 IMPLANT
SYR TOOMEY IRRIG 70ML (MISCELLANEOUS) ×4
SYRINGE TOOMEY IRRIG 70ML (MISCELLANEOUS) IMPLANT
TOWEL OR 17X26 4PK STRL BLUE (TOWEL DISPOSABLE) ×4 IMPLANT
WATER STERILE IRR 1000ML POUR (IV SOLUTION) ×4 IMPLANT
WATER STERILE IRR 3000ML UROMA (IV SOLUTION) ×6 IMPLANT

## 2020-03-20 NOTE — Anesthesia Preprocedure Evaluation (Signed)
Anesthesia Evaluation  Patient identified by MRN, date of birth, ID band Patient awake    Reviewed: Allergy & Precautions, NPO status , Patient's Chart, lab work & pertinent test results  History of Anesthesia Complications Negative for: history of anesthetic complications  Airway Mallampati: II  TM Distance: >3 FB Neck ROM: Full    Dental  (+) Edentulous Upper, Edentulous Lower   Pulmonary shortness of breath and with exertion, COPD,  COPD inhaler, former smoker,  Right Lung cancer, lobectomy - 2018   Pulmonary exam normal breath sounds clear to auscultation       Cardiovascular Exercise Tolerance: Good hypertension, Pt. on medications + Peripheral Vascular Disease (AAA repair)  + dysrhythmias Atrial Fibrillation + Valvular Problems/Murmurs (TAVR) AS  Rhythm:Irregular Rate:Normal - Systolic murmurs, - Diastolic murmurs, - Friction Rub, - Carotid Bruit, - Peripheral Edema and - Systolic Click    Neuro/Psych negative psych ROS   GI/Hepatic GERD  Medicated,  Endo/Other  Hypothyroidism   Renal/GU  Bladder dysfunction (Bladder cancer)      Musculoskeletal  (+) Arthritis ,   Abdominal   Peds  Hematology   Anesthesia Other Findings   Reproductive/Obstetrics                           Anesthesia Physical Anesthesia Plan  ASA: III  Anesthesia Plan: General   Post-op Pain Management:    Induction: Intravenous  PONV Risk Score and Plan: 4 or greater and Ondansetron  Airway Management Planned: LMA  Additional Equipment:   Intra-op Plan:   Post-operative Plan: Extubation in OR  Informed Consent: I have reviewed the patients History and Physical, chart, labs and discussed the procedure including the risks, benefits and alternatives for the proposed anesthesia with the patient or authorized representative who has indicated his/her understanding and acceptance.       Plan Discussed  with: CRNA and Surgeon  Anesthesia Plan Comments:         Anesthesia Quick Evaluation

## 2020-03-20 NOTE — Anesthesia Procedure Notes (Signed)
Procedure Name: LMA Insertion Date/Time: 03/20/2020 8:31 AM Performed by: Orlie Dakin, CRNA Pre-anesthesia Checklist: Patient identified, Emergency Drugs available, Suction available and Patient being monitored Patient Re-evaluated:Patient Re-evaluated prior to induction Oxygen Delivery Method: Circle system utilized Preoxygenation: Pre-oxygenation with 100% oxygen Induction Type: IV induction LMA: LMA inserted LMA Size: 4.0 Tube type: Oral Number of attempts: 1 Placement Confirmation: positive ETCO2 Tube secured with: Tape Dental Injury: Teeth and Oropharynx as per pre-operative assessment

## 2020-03-20 NOTE — Transfer of Care (Signed)
Immediate Anesthesia Transfer of Care Note  Patient: [name] [name]  Procedure(s) Performed: CYSTOSCOPY WITH BILATERAL RETROGRADE PYELOGRAM (Bilateral ) TRANSURETHRAL RESECTION OF BLADDER TUMOR (TURBT) (N/A )  Patient Location: PACU  Anesthesia Type:General  Level of Consciousness: drowsy  Airway & Oxygen Therapy: Patient Spontanous Breathing and Patient connected to nasal cannula oxygen  Post-op Assessment: Report given to RN and Post -op Vital signs reviewed and stable  Post vital signs: Reviewed and stable  Last Vitals:  Vitals Value Taken Time  BP 110/62 03/20/20 0905  Temp    Pulse 76 03/20/20 0906  Resp 12 03/20/20 0906  SpO2 100 % 03/20/20 0906  Vitals shown include unvalidated device data.  Last Pain:  Vitals:   03/20/20 0730  TempSrc: Oral  PainSc: 0-No pain      Patients Stated Pain Goal: 6 (23/95/32 0233)  Complications: No complications documented.

## 2020-03-20 NOTE — Op Note (Signed)
Preop diagnosis: History of bladder cancer with evidence of recurrence cystoscopically  Postoperative diagnosis: Same  Principal procedure: Cystoscopy, bilateral retrograde ureteral pyelograms, fluoroscopic interpretation, transurethral resection of bladder neck tumor, 3 cm, placement of intravesical gemcitabine  Surgeon: Aylene Acoff  Anesthesia: General with LMA  Complications: None  Estimated blood loss: Less than 10 mL  Specimen: Bladder tumor, to pathology  Indications: 79 year old male with history of high-grade nonmuscle invasive bladder cancer.  He has had immune therapy.  Recent cystoscopy a week ago revealed recurrence at the patient's bladder neck on the left side.  He presents at this time for cystoscopy, retrogrades, transurethral resection of bladder tumor as well as placement of gemcitabine postoperatively.  I discussed the procedure with the patient and his wife including expected risks, complications and outcomes.  They understand and desire to proceed.  Findings: Retrograde study of the ureters and renal pelves as well as calyces with Omnipaque revealed normal ureters throughout, without evidence of filling defects or strictures.  Pyelocalyceal systems were normal bilaterally.  Urothelium of the bladder was normal except at the left bladder neck starting at the 6 o'clock position, working counterclockwise to about the 12 o'clock position.  This was papillary in nature, low-lying.  No nodularity was present within this tumor and the remaining urothelium of the bladder was normal.  Ureteral orifice ease were normal in position, configuration.  Description of procedure: The patient was properly identified in the holding area and was taken to the operating room where general anesthetic was administered with the LMA.  He was positioned in the dorsolithotomy position, genitalia and perineum were prepped and draped, proper timeout was performed.  He received preoperative IV antibiotics.   21 French panendoscope was advanced into the bladder with the recurrence seen and documented with the picture/camera.  Using a 6 [place] open-ended catheter bilateral retrograde ureteropyelogram was performed with Omnipaque.  The above-mentioned findings were noted.  Following this, the 47 French cystoscope was removed and a 68 French resectoscope sheath was passed using the visual obturator.  The resectoscope and cutting loop were then placed.  Using monopolar cautery/cutting, the entire patch of abnormal urothelium was removed.  Fragments were sent labeled bladder tumor.  The base of the resected area was then cauterized and no bleeding was seen.  There was no abnormal urothelium left after this.  I then removed the scope after the fragments were sent for pathology, an 29 French Foley catheter was placed in the bladder drained.  The balloon was filled with 10 cc of water.  The plug was placed on the catheter.  The patient was then awakened and taken to the PACU in stable condition.  He tolerated the procedure well.  In the PACU, 2 g of gemcitabine and diluent were placed within the bladder and left indwelling for an hour, this was then drained.

## 2020-03-20 NOTE — Anesthesia Postprocedure Evaluation (Signed)
Anesthesia Post Note  Patient: CORRADO HYMON  Procedure(s) Performed: CYSTOSCOPY WITH BILATERAL RETROGRADE PYELOGRAM (Bilateral ) TRANSURETHRAL RESECTION OF BLADDER TUMOR (TURBT) (N/A )  Patient location during evaluation: PACU Anesthesia Type: General Level of consciousness: awake and alert Pain management: pain level controlled Vital Signs Assessment: post-procedure vital signs reviewed and stable Respiratory status: spontaneous breathing, nonlabored ventilation and respiratory function stable Cardiovascular status: blood pressure returned to baseline Postop Assessment: no headache and no apparent nausea or vomiting Anesthetic complications: no   No complications documented.   Last Vitals:  Vitals:   03/20/20 1030 03/20/20 1038  BP: 121/62 128/61  Pulse: 63 66  Resp: 12 14  Temp:  36.5 C  SpO2: 98% 99%    Last Pain:  Vitals:   03/20/20 1038  TempSrc: Oral  PainSc: 0-No pain                 Orlie Dakin

## 2020-03-20 NOTE — H&P (Signed)
H&P  Chief Complaint: Recurrent bladder cancer  History of Present Illness: 79 year old male presents today for repeat TURBT. He was seen in our [place] office 1 week ago for routine/surveillance cystoscopy. This revealed a small recurrence of the bladder neck. He comes in for TURBT.  Past history is as follows:  He underwent emergent TURBT on 12.15.2017 for a large bladder cancer burden at his right bladder neck, posterior dome and left lateral wall. He did well after the procedure. He apparently been bleeding for 3 months. He is a cigarette smoker.   Pathology revealed high-grade, NMIBC. Muscle was present in the specimen.   He underwent repeat resection on 1.30.2018. 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   He was initiated on BCG and competed his 6 induction treatments on 3.27.2018.   5.15.2018 -he was found to have a small papillary recurrence on his anterior bladder wall--he underwent cysto/cauterization of this on 5.17.2018.  7.31.2018. 1st 3 maintenance BCG's completed  9.18.2018: Office cysto negative  10.24.2018: completed 3 maintenance BCG Rx's  1.22.2019: Surveillance cystoscopy.  2.19.2019: Completed 3 maintenance BCG treatments  4.2.2019: He has had no blood or burning with urination since his BCG has been completed. He tolerated the BCG well.  2.25.2020--Cysto--small BT recurrence. 3.12.2020: TURBT/bilateral RGPs. Cytologies of renal washings nml. Bladder tumor LG NMIBC.  6.2.2020: Cysto negative.  10.6.2020: Cystoscopy negative 3.2021: Cystoscopy negative, cytology with atypia  Past Medical History:  Diagnosis Date  . AAA (abdominal aortic aneurysm) ([place])   . Aortic stenosis, severe    a. 02/2017: s/p TAVR with Edwards Sapien 3 THV (size 26 mm, model # 9600TFX, serial # [id number])  . Arthritis   . Atherosclerotic peripheral vascular disease ([place])   . Atrial fibrillation, persistent ([place])    a. not a candidate  for Esko given hx of GI bleed  . Bilateral carotid artery stenosis   . Bladder cancer ([place])   . COPD (chronic obstructive pulmonary disease) ([place])   . Gastroesophageal reflux disease without esophagitis 05/25/2019  . Hyperlipidemia   . Hypertension   . Hypothyroidism   . Lung cancer ([place])    Right  . Lung mass    Right  . Skin cancer   . Vitamin D deficiency     Past Surgical History:  Procedure Laterality Date  . ABDOMINAL AORTIC ANEURYSM REPAIR  08-09-09  . BACK SURGERY     x2  . CYSTOSCOPY W/ RETROGRADES Bilateral 09/02/2018   Procedure: CYSTOSCOPY WITH RETROGRADE PYELOGRAM;  Surgeon: Franchot Gallo, MD;  Location[facility];  Service: Urology;  Laterality: Bilateral;  . CYSTOSCOPY WITH BIOPSY N/A 09/02/2018   Procedure: CYSTOSCOPY WITH BIOPSY;  Surgeon: Franchot Gallo, MD;  Location[facility];  Service: Urology;  Laterality: N/A;  . ESOPHAGOGASTRODUODENOSCOPY N/A 02/21/2017   Procedure: ESOPHAGOGASTRODUODENOSCOPY (EGD);  Surgeon: Jerene Bears, MD;  Location[facility];  Service: Gastroenterology;  Laterality: N/A;  . HYDROCELE EXCISION / REPAIR    . LOBECTOMY Right 03/30/2017   Procedure: RIGHT UPPER LOBECTOMY LUNG;  Surgeon: Melrose Nakayama, MD;  Location: [place];  Service: Thoracic;  Laterality: Right;  . SKIN CANCER EXCISION    . SPINE SURGERY    . TEE WITHOUT CARDIOVERSION N/A 02/24/2017   Procedure: TRANSESOPHAGEAL ECHOCARDIOGRAM (TEE);  Surgeon: Sherren Mocha, MD;  Location: [place];  Service: Open Heart Surgery;  Laterality: N/A;  . TRANSCATHETER AORTIC VALVE REPLACEMENT, TRANSFEMORAL N/A 02/24/2017   Procedure: TRANSCATHETER AORTIC VALVE REPLACEMENT, TRANSFEMORAL using a  31mm Edwards Sapien 3 Aortic Valve;  Surgeon: Sherren Mocha, MD;  Location: [place];  Service: Open Heart Surgery;  Laterality: N/A;  . TRANSURETHRAL RESECTION OF BLADDER TUMOR N/A 06/06/2016   Procedure: TRANSURETHRAL RESECTION OF BLADDER TUMOR (TURBT);   Surgeon: Franchot Gallo, MD;  Location: [facility];  Service: Urology;  Laterality: N/A;  . TRANSURETHRAL RESECTION OF BLADDER TUMOR N/A 07/22/2016   Procedure: TRANSURETHRAL RESECTION OF BLADDER TUMOR (TURBT);  Surgeon: Franchot Gallo, MD;  Location: [facility];  Service: Urology;  Laterality: N/A;  . VIDEO ASSISTED THORACOSCOPY (VATS)/WEDGE RESECTION Right 03/30/2017   Procedure: VIDEO ASSISTED THORACOSCOPY (VATS)/WEDGE RESECTION;  Surgeon: Melrose Nakayama, MD;  Location: [place];  Service: Thoracic;  Laterality: Right;    Home Medications:    Allergies:  Allergies  Allergen Reactions  . Lovenox [Enoxaparin]     HIT panel ordered 10/10    Family History  Problem Relation Age of Onset  . Heart disease Father        Heart Disease before age 28  . Alcohol abuse Father   . Heart attack Father   . Cancer Mother        Lung  . COPD Mother   . COPD Sister   . Lupus Sister   . Hypertension Brother   . Gout Brother   . Heart attack Son   . Gout Brother   . Prostatitis Brother     Social History:  reports that he quit smoking about 14 years ago. His smoking use included cigarettes. He started smoking about 65 years ago. He has a 51.00 pack-year smoking history. He has never used smokeless tobacco. He reports that he does not drink alcohol and does not use drugs.  ROS: A complete review of systems was performed.  All systems are negative except for pertinent findings as noted.  Physical Exam:  Vital signs in last 24 hours: There were no vitals taken for this visit. Constitutional:  Alert and oriented, No acute distress Cardiovascular: Regular rate  Respiratory: Normal respiratory effort GI: Abdomen is soft, nontender, nondistended, no abdominal masses. No CVAT.  Genitourinary: Normal male phallus, testes are descended bilaterally and non-tender and without masses, scrotum is normal in appearance without lesions or masses, perineum is normal on inspection. Lymphatic: No  lymphadenopathy Neurologic: Grossly intact, no focal deficits Psychiatric: Normal mood and affect  Laboratory Data:  Recent Labs    03/19/20 0900  WBC 8.7  HGB 13.4  HCT 39.8  PLT 148*    Recent Labs    03/19/20 0900  NA 141  K 4.5  CL 107  GLUCOSE 112*  BUN 15  CALCIUM 9.6  CREATININE 1.23     Results for orders placed or performed in visit on 03/19/20 (from the past 24 hour(s))  CMP ([place] only)     Status: Abnormal   Collection Time: 03/19/20  9:00 AM  Result Value Ref Range   Sodium 141 135 - 145 mmol/L   Potassium 4.5 3.5 - 5.1 mmol/L   Chloride 107 98 - 111 mmol/L   CO2 27 22 - 32 mmol/L   Glucose, Bld 112 (H) 70 - 99 mg/dL   BUN 15 8 - 23 mg/dL   Creatinine 1.23 0.61 - 1.24 mg/dL   Calcium 9.6 8.9 - 10.3 mg/dL   Total Protein 8.1 6.5 - 8.1 g/dL   Albumin 4.1 3.5 - 5.0 g/dL   AST 27 15 - 41 U/L   ALT 22 0 - 44 U/L  Alkaline Phosphatase 63 38 - 126 U/L   Total Bilirubin 1.0 0.3 - 1.2 mg/dL   GFR, Est Non Af Am 55 (L) >60 mL/min   GFR, Est AFR Am >60 >60 mL/min   Anion gap 7 5 - 15  CBC with Differential (Cancer Center Only)     Status: Abnormal   Collection Time: 03/19/20  9:00 AM  Result Value Ref Range   WBC Count 8.7 4.0 - 10.5 K/uL   RBC 4.23 4.22 - 5.81 MIL/uL   Hemoglobin 13.4 13.0 - 17.0 g/dL   HCT 39.8 39 - 52 %   MCV 94.1 80.0 - 100.0 fL   MCH 31.7 26.0 - 34.0 pg   MCHC 33.7 30.0 - 36.0 g/dL   RDW 13.2 11.5 - 15.5 %   Platelet Count 148 (L) 150 - 400 K/uL   nRBC 0.0 0.0 - 0.2 %   Neutrophils Relative % 74 %   Neutro Abs 6.5 1.7 - 7.7 K/uL   Lymphocytes Relative 17 %   Lymphs Abs 1.5 0.7 - 4.0 K/uL   Monocytes Relative 7 %   Monocytes Absolute 0.6 0 - 1 K/uL   Eosinophils Relative 1 %   Eosinophils Absolute 0.1 0 - 0 K/uL   Basophils Relative 1 %   Basophils Absolute 0.1 0 - 0 K/uL   Immature Granulocytes 0 %   Abs Immature Granulocytes 0.02 0.00 - 0.07 K/uL   Recent Results (from the past 240 hour(s))  Microscopic  Examination     Status: Abnormal   Collection Time: 03/13/20  8:51 AM   Urine  Result Value Ref Range Status   WBC, UA 11-30 (A) 0 - 5 /hpf Final   RBC 11-30 (A) 0 - 2 /hpf Final   Epithelial Cells (non renal) 0-10 0 - 10 /hpf Final   Renal Epithel, UA None seen None seen /hpf Final   Mucus, UA Present Not Estab. Final   Bacteria, UA Few None seen/Few Final  SARS CORONAVIRUS 2 (TAT 6-24 HRS) Nasopharyngeal Nasopharyngeal Swab     Status: None   Collection Time: 03/16/20  8:34 AM   Specimen: Nasopharyngeal Swab  Result Value Ref Range Status   SARS Coronavirus 2 NEGATIVE NEGATIVE Final    Comment: (NOTE) SARS-CoV-2 target nucleic acids are NOT DETECTED.  The SARS-CoV-2 RNA is generally detectable in upper and lower respiratory specimens during the acute phase of infection. Negative results do not preclude SARS-CoV-2 infection, do not rule out co-infections with other pathogens, and should not be used as the sole basis for treatment or other patient management decisions. Negative results must be combined with clinical observations, patient history, and epidemiological information. The expected result is Negative.  Fact Sheet for Patients: SugarRoll.be  Fact Sheet for Healthcare Providers: https://www.woods-mathews.com/  This test is not yet approved or cleared by the [place] FDA and  has been authorized for detection and/or diagnosis of SARS-CoV-2 by FDA under an Emergency Use Authorization (EUA). This EUA will remain  in effect (meaning this test can be used) for the duration of the COVID-19 declaration under Se ction 564(b)(1) of the Act, 21 U.S.C. section 360bbb-3(b)(1), unless the authorization is terminated or revoked sooner.  Performed at [facility], [place] [address] [place], [place] [address]     Renal Function: Recent Labs    03/16/20 8299 03/19/20 0900  CREATININE 0.96 1.23   Estimated Creatinine  Clearance: 49.5 mL/min (by C-G formula based on SCr of 1.23 mg/dL).  Radiologic Imaging: CT Chest W Contrast  Result Date: 03/19/2020 CLINICAL DATA:  Non-small cell lung cancer, restaging. EXAM: CT CHEST WITH CONTRAST TECHNIQUE: Multidetector CT imaging of the chest was performed during intravenous contrast administration. CONTRAST:  66mL OMNIPAQUE IOHEXOL 300 MG/ML  SOLN COMPARISON:  03/18/2019 FINDINGS: Cardiovascular: Heart size is normal. Previous aortic valve repair. Aortic atherosclerosis. Coronary artery calcifications. Mediastinum/Nodes: Normal appearance of the thyroid gland. The trachea appears patent and is midline. Normal appearance of the esophagus. No enlarged axillary, supraclavicular, mediastinal or hilar adenopathy. Right paratracheal lymph node measures 0.6 cm, image 56/2. This is decreased from 0.9 cm previously. Lungs/Pleura: Status post right upper lobectomy. No pleural effusion. Moderate to severe changes of centrilobular and paraseptal emphysema. Pleuroparenchymal scarring within the posterior lung bases is unchanged. No suspicious lung nodule or mass identified. Upper Abdomen: No acute findings within the imaged portions of the upper abdomen. Stone within the dependent portion of the gallbladder measures 6 mm, image 108/2. Musculoskeletal: No acute or suspicious osseous findings. IMPRESSION: 1. Status post right upper lobectomy. No specific findings identified to suggest recurrent or metastatic disease. 2. Emphysema and aortic atherosclerosis. 3. Coronary artery calcifications noted. 4. Gallstone. Aortic Atherosclerosis (ICD10-I70.0) and Emphysema (ICD10-J43.9). Electronically Signed   By: Kerby Moors M.D.   On: 03/19/2020 12:39    Impression/Assessment:  Recurrent bladder cancer  Plan:  Cystoscopy, retrogrades, TURBT/gemcitabine administration

## 2020-03-20 NOTE — Interval H&P Note (Signed)
History and Physical Interval Note:  03/20/2020 8:08 AM  [name] [name]  has presented today for surgery, with the diagnosis of history bladder cancer.  The various methods of treatment have been discussed with the patient and family. After consideration of risks, benefits and other options for treatment, the patient has consented to  Procedure(s): CYSTOSCOPY WITH BILATERAL RETROGRADE PYELOGRAM (Bilateral) TRANSURETHRAL RESECTION OF BLADDER TUMOR (TURBT) (N/A) as a surgical intervention.  The patient's history has been reviewed, patient examined, no change in status, stable for surgery.  I have reviewed the patient's chart and labs.  Questions were answered to the patient's satisfaction.     Lillette Boxer Rhonna Holster

## 2020-03-20 NOTE — Discharge Instructions (Signed)
1. You may see some blood in the urine and may have some burning with urination for 48-72 hours. You also may notice that you have to urinate more frequently or urgently after your procedure which is normal.  2. You should call should you develop an inability urinate, fever > 101, persistent nausea and vomiting that prevents you from eating or drinking to stay hydrated.  3. If you have a catheter, you will be taught how to take care of the catheter by the nursing staff prior to discharge from the hospital.  You may periodically feel a strong urge to void with the catheter in place.  This is a bladder spasm and most often can occur when having a bowel movement or moving around. It is typically self-limited and usually will stop after a few minutes.  You may use some Vaseline or Neosporin around the tip of the catheter to reduce friction at the tip of the penis. You may also see some blood in the urine.  A very small amount of blood can make the urine look quite red.  As long as the catheter is draining well, there usually is not a problem.  However, if the catheter is not draining well and is bloody, you should call the office [phone]) to notify us.  If your urine is running fairly clear, it is okay to remove the catheter as directed on Wednesday morning.        4. You can resume Eliquis when urine is yellow

## 2020-03-21 ENCOUNTER — Encounter (HOSPITAL_COMMUNITY): Payer: Self-pay | Admitting: Urology

## 2020-03-21 ENCOUNTER — Inpatient Hospital Stay (HOSPITAL_BASED_OUTPATIENT_CLINIC_OR_DEPARTMENT_OTHER): Payer: Medicare Other | Admitting: Internal Medicine

## 2020-03-21 DIAGNOSIS — C3491 Malignant neoplasm of unspecified part of right bronchus or lung: Secondary | ICD-10-CM | POA: Diagnosis not present

## 2020-03-21 DIAGNOSIS — C3490 Malignant neoplasm of unspecified part of unspecified bronchus or lung: Secondary | ICD-10-CM

## 2020-03-21 NOTE — Progress Notes (Signed)
[facility] Telephone:([phone]   Fax:(336) [phone]  PROGRESS NOTE FOR TELEMEDICINE VISITS  [name] [name], [place] [address]  I connected [phone] on 03/21/20 at 10:30 AM EDT by telephone visit and verified that I am speaking with the correct person using two identifiers.   I discussed the limitations, risks, security and privacy concerns of performing an evaluation and management service by telemedicine and the availability of in-person appointments. I also discussed with the patient that there may be a patient responsible charge related to this service. The patient expressed understanding and agreed to proceed.  Other persons participating in the visit and their role in the encounter:  None  Patient's location: Home Provider's location:    DIAGNOSIS: Stage IA (T1a, N0, M0)non-small cell lung cancer, squamous cell carcinoma presented with right upper lobe lung nodule   PRIOR THERAPY: status post right upper lobectomy with lymph node dissection on March 30, 2017 under the care of Dr. Roxan Hockey.  CURRENT THERAPY: Observation.  INTERVAL HISTORY: [name] [name] 79 y.o. male has a telephone virtual visit with me today for evaluation and discussion of his discuss results.  The patient is feeling fine today with no concerning complaints.  He had bladder surgery recently and he has a catheter and back for urine collection.  It was not convenient for him to come to the cancer center with his condition.  He denied having any complaints.  He denied having any chest pain, shortness of breath, cough or hemoptysis.  He denied having any fever or chills.  He has no nausea, vomiting, diarrhea or constipation.  He has no headache or visual changes.  The patient had repeat CT scan of the chest performed recently and is having the virtual visit for evaluation and discussion of his scan results.  MEDICAL HISTORY: Past Medical History:    Diagnosis Date  . AAA (abdominal aortic aneurysm) ([place])   . Aortic stenosis, severe    a. 02/2017: s/p TAVR with Edwards Sapien 3 THV (size 26 mm, model # 9600TFX, serial # [id number])  . Arthritis   . Atherosclerotic peripheral vascular disease ([place])   . Atrial fibrillation, persistent ([place])    a. not a candidate for [facility] given hx of GI bleed  . Bilateral carotid artery stenosis   . Bladder cancer ([place])   . COPD (chronic obstructive pulmonary disease) ([place])   . Gastroesophageal reflux disease without esophagitis 05/25/2019  . Hyperlipidemia   . Hypertension   . Hypothyroidism   . Lung cancer ([place])    Right  . Lung mass    Right  . Skin cancer   . Vitamin D deficiency     ALLERGIES:  is allergic to lovenox [enoxaparin].  MEDICATIONS:  Current Outpatient Medications  Medication Sig Dispense Refill  . alfuzosin (UROXATRAL) 10 MG 24 hr tablet Take 1 tablet (10 mg total) by mouth at bedtime. 30 tablet prn  . amLODipine (NORVASC) 10 MG tablet Take 1 tablet (10 mg total) by mouth daily. 90 tablet 1  . atorvastatin (LIPITOR) 40 MG tablet Take 1 tablet (40 mg total) by mouth daily. 90 tablet 1  . cephALEXin (KEFLEX) 500 MG capsule Take 1 capsule (500 mg total) by mouth 2 (two) times daily for 6 doses. 6 capsule 0  . cholecalciferol (VITAMIN D) 1000 UNITS tablet Take 1 tablet (1,000 Units total) by mouth daily. 30 tablet 6  . ferrous gluconate (FERGON) 324 MG tablet 1 po BID (Patient taking differently:  Take 324 mg by mouth at bedtime. ) 180 tablet 1  . levothyroxine (SYNTHROID) 75 MCG tablet Take 1 tablet (75 mcg total) by mouth daily. 90 tablet 1  . lisinopril (ZESTRIL) 40 MG tablet Take 1 tablet (40 mg total) by mouth daily. 90 tablet 1  . metoprolol succinate (TOPROL XL) 25 MG 24 hr tablet Take 0.5 tablets (12.5 mg total) by mouth daily. 45 tablet 1  . Multiple Vitamin (MULTIVITAMIN WITH MINERALS) TABS tablet Take 1 tablet by mouth daily. One A Day Multivitamin for Men    . Omega-3 Fatty  Acids (FISH OIL) 1200 MG CAPS Take 2,400 mg by mouth daily.     Marland Kitchen oxybutynin (DITROPAN) 5 MG tablet Take 1 tablet (5 mg total) by mouth every 8 (eight) hours as needed for up to 15 doses for bladder spasms. 10 tablet 0  . pantoprazole (PROTONIX) 40 MG tablet TAKE  (1)  TABLET TWICE A DAY. (Patient taking differently: Take 40 mg by mouth in the morning and at bedtime. ) 180 tablet 1   No current facility-administered medications for this visit.    SURGICAL HISTORY:  Past Surgical History:  Procedure Laterality Date  . ABDOMINAL AORTIC ANEURYSM REPAIR  08-09-09  . BACK SURGERY     x2  . CYSTOSCOPY W/ RETROGRADES Bilateral 09/02/2018   Procedure: CYSTOSCOPY WITH RETROGRADE PYELOGRAM;  Surgeon: Franchot Gallo, MD;  Location: [facility];  Service: Urology;  Laterality: Bilateral;  . CYSTOSCOPY W/ RETROGRADES Bilateral 03/20/2020   Procedure: CYSTOSCOPY WITH BILATERAL RETROGRADE PYELOGRAM;  Surgeon: Franchot Gallo, MD;  Location: [facility];  Service: Urology;  Laterality: Bilateral;  . CYSTOSCOPY WITH BIOPSY N/A 09/02/2018   Procedure: CYSTOSCOPY WITH BIOPSY;  Surgeon: Franchot Gallo, MD;  Location: [facility];  Service: Urology;  Laterality: N/A;  . ESOPHAGOGASTRODUODENOSCOPY N/A 02/21/2017   Procedure: ESOPHAGOGASTRODUODENOSCOPY (EGD);  Surgeon: Jerene Bears, MD;  Location[facility]COPY;  Service: Gastroenterology;  Laterality: N/A;  . HYDROCELE EXCISION / REPAIR    . LOBECTOMY Right 03/30/2017   Procedure: RIGHT UPPER LOBECTOMY LUNG;  Surgeon: Melrose Nakayama, MD;  Location: [facility];  Service: Thoracic;  Laterality: Right;  . SKIN CANCER EXCISION    . SPINE SURGERY    . TEE WITHOUT CARDIOVERSION N/A 02/24/2017   Procedure: TRANSESOPHAGEAL ECHOCARDIOGRAM (TEE);  Surgeon: Sherren Mocha, MD;  Location: [facility];  Service: Open Heart Surgery;  Laterality: N/A;  . TRANSCATHETER AORTIC VALVE REPLACEMENT, TRANSFEMORAL N/A 02/24/2017   Procedure: TRANSCATHETER  AORTIC VALVE REPLACEMENT, TRANSFEMORAL using a 5mm Edwards Sapien 3 Aortic Valve;  Surgeon: Sherren Mocha, MD;  Location: [place];  Service: Open Heart Surgery;  Laterality: N/A;  . TRANSURETHRAL RESECTION OF BLADDER TUMOR N/A 06/06/2016   Procedure: TRANSURETHRAL RESECTION OF BLADDER TUMOR (TURBT);  Surgeon: Franchot Gallo, MD;  Location: [facility];  Service: Urology;  Laterality: N/A;  . TRANSURETHRAL RESECTION OF BLADDER TUMOR N/A 07/22/2016   Procedure: TRANSURETHRAL RESECTION OF BLADDER TUMOR (TURBT);  Surgeon: Franchot Gallo, MD;  Location: [facility];  Service: Urology;  Laterality: N/A;  . TRANSURETHRAL RESECTION OF BLADDER TUMOR N/A 03/20/2020   Procedure: TRANSURETHRAL RESECTION OF BLADDER TUMOR (TURBT);  Surgeon: Franchot Gallo, MD;  Location: [facility];  Service: Urology;  Laterality: N/A;  . VIDEO ASSISTED THORACOSCOPY (VATS)/WEDGE RESECTION Right 03/30/2017   Procedure: VIDEO ASSISTED THORACOSCOPY (VATS)/WEDGE RESECTION;  Surgeon: Melrose Nakayama, MD;  Location: [place];  Service: Thoracic;  Laterality: Right;    REVIEW OF SYSTEMS:  A comprehensive review  of systems was negative.    LABORATORY DATA: Lab Results  Component Value Date   WBC 8.7 03/19/2020   HGB 13.4 03/19/2020   HCT 39.8 03/19/2020   MCV 94.1 03/19/2020   PLT 148 (L) 03/19/2020      Chemistry      Component Value Date/Time   NA 141 03/19/2020 0900   NA 143 11/24/2019 0905   NA 140 05/07/2017 1419   K 4.5 03/19/2020 0900   K 4.2 05/07/2017 1419   CL 107 03/19/2020 0900   CO2 27 03/19/2020 0900   CO2 25 05/07/2017 1419   BUN 15 03/19/2020 0900   BUN 12 11/24/2019 0905   BUN 14.5 05/07/2017 1419   CREATININE 1.23 03/19/2020 0900   CREATININE 1.0 05/07/2017 1419      Component Value Date/Time   CALCIUM 9.6 03/19/2020 0900   CALCIUM 9.6 05/07/2017 1419   ALKPHOS 63 03/19/2020 0900   ALKPHOS 85 05/07/2017 1419   AST 27 03/19/2020 0900   AST 23 05/07/2017 1419   ALT 22 03/19/2020 0900   ALT 14  05/07/2017 1419   BILITOT 1.0 03/19/2020 0900   BILITOT 0.48 05/07/2017 1419       RADIOGRAPHIC STUDIES: CT Chest W Contrast  Result Date: 03/19/2020 CLINICAL DATA:  Non-small cell lung cancer, restaging. EXAM: CT CHEST WITH CONTRAST TECHNIQUE: Multidetector CT imaging of the chest was performed during intravenous contrast administration. CONTRAST:  43mL OMNIPAQUE IOHEXOL 300 MG/ML  SOLN COMPARISON:  03/18/2019 FINDINGS: Cardiovascular: Heart size is normal. Previous aortic valve repair. Aortic atherosclerosis. Coronary artery calcifications. Mediastinum/Nodes: Normal appearance of the thyroid gland. The trachea appears patent and is midline. Normal appearance of the esophagus. No enlarged axillary, supraclavicular, mediastinal or hilar adenopathy. Right paratracheal lymph node measures 0.6 cm, image 56/2. This is decreased from 0.9 cm previously. Lungs/Pleura: Status post right upper lobectomy. No pleural effusion. Moderate to severe changes of centrilobular and paraseptal emphysema. Pleuroparenchymal scarring within the posterior lung bases is unchanged. No suspicious lung nodule or mass identified. Upper Abdomen: No acute findings within the imaged portions of the upper abdomen. Stone within the dependent portion of the gallbladder measures 6 mm, image 108/2. Musculoskeletal: No acute or suspicious osseous findings. IMPRESSION: 1. Status post right upper lobectomy. No specific findings identified to suggest recurrent or metastatic disease. 2. Emphysema and aortic atherosclerosis. 3. Coronary artery calcifications noted. 4. Gallstone. Aortic Atherosclerosis (ICD10-I70.0) and Emphysema (ICD10-J43.9). Electronically Signed   By: Kerby Moors M.D.   On: 03/19/2020 12:39   DG C-Arm 1-60 Min-No Report  Result Date: 03/20/2020 Fluoroscopy was utilized by the requesting physician.  No radiographic interpretation.    ASSESSMENT AND PLAN: This is a very pleasant 79 years old white male with a stage IA  non-small cell lung cancer status post right upper lobectomy with lymph node dissection in October 2018. The patient is currently on observation and he is feeling fine today with no concerning complaints. He had repeat CT scan of the chest performed recently.  I personally and independently reviewed the scans and discussed the results with the patient today. His scan showed no concerning findings for disease recurrence or metastasis. I recommended for the patient to continue on observation with repeat CT scan of the chest in 1 year. He was advised to call immediately if he has any concerning symptoms in the interval. I discussed the assessment and treatment plan with the patient. The patient was provided an opportunity to ask questions and all were answered. The  patient agreed with the plan and demonstrated an understanding of the instructions.   The patient was advised to call back or seek an in-person evaluation if the symptoms worsen or if the condition fails to improve as anticipated.  I provided 12 minutes of non face-to-face telephone visit time during this encounter, and > 50% was spent counseling as documented under my assessment & plan.  Eilleen Kempf, MD 03/21/2020 10:21 AM  Disclaimer: This note was dictated with voice recognition software. Similar sounding words can inadvertently be transcribed and may not be corrected upon review.

## 2020-03-22 LAB — SURGICAL PATHOLOGY

## 2020-03-27 ENCOUNTER — Ambulatory Visit: Payer: Medicare Other

## 2020-03-27 ENCOUNTER — Telehealth: Payer: Self-pay | Admitting: Internal Medicine

## 2020-03-27 NOTE — Telephone Encounter (Signed)
Scheduled per los. Called and left msg. Mailed printout  °

## 2020-04-10 ENCOUNTER — Telehealth: Payer: Self-pay | Admitting: Urology

## 2020-04-10 NOTE — Telephone Encounter (Signed)
Patient had surgery a few weeks ago and states he is having issues he needs to discuss.

## 2020-04-10 NOTE — Telephone Encounter (Signed)
Pt had surgery 3 weeks ago today.  Pt started his eliquis 3 days after surgery and has hematuria everytime he attempts to get back on his eliquis. At this current time pt has gross hematuria since starting back (2 days back on eliquis)

## 2020-04-11 NOTE — Telephone Encounter (Signed)
Regarding hematuria and eliquis--I would have him stop it for 3 days at a time, let blood clear, then start back--continue cycle until no more blood present. Other option is to see him in office for cauterization (w/ him off of eliquis)

## 2020-04-12 NOTE — Telephone Encounter (Signed)
Attempted to call pt. Lft mes. On home # and unable to on cell.

## 2020-04-12 NOTE — Telephone Encounter (Signed)
Pt called back and made aware. Pt voiced understanding.

## 2020-04-23 NOTE — Progress Notes (Signed)
Cardiology Office Note  Date: 04/24/2020   ID: ODAS OZER, DOB [birth year], MRN [id number]  PCP:  [name], FNP  Cardiologist:  Kate Sable, MD (Inactive) Electrophysiologist:  None    Chief Complaint: Follow-up status post TAVR, permanent atrial fibrillation, CAD, PVD, HLD.  History of Present Illness: [name] [name] is a 79 y.o. male with a history of TAVR, CAD, permanent atrial fibrillation, HLD, PVD, history of aneurysm repair of abdominal aorta using endovascular graft.  History of lung CA right lung.  History of recurrent bladder cancer with recent TURBT, history of smoking, COPD, HTN.  Last saw Dr. Bronson Ing 10/27/2019.  He denied any symptoms of chest pain, palpitations, shortness of breath, lightheadedness, dizziness, leg swelling, orthopnea, PND, syncope.  Stated his legs sometimes got a little weak but improving.  He was symptomatically stable on Toprol-XL 12.5 mg daily.  No longer bradycardic.  Restarted on Eliquis April 2021.  No bleeding issues.  CAD was symptomatically stable.  He was continuing atorvastatin and metoprolol.  No aspirin due to Eliquis therapy.  Continuing Lipitor.  Lipids were at goal.  He was followed by vascular for peripheral vascular disease.  He was continuing his statin.  He is here today for 80-month follow-up.  Denies any recent issues except for some bleeding after his recent bladder surgery with Dr. Diona Fanti.  States he had to stop his Eliquis temporarily a couple of times until his bleeding resolved.  He is returned to daily Eliquis and has no recent bleeding within the last 2 to 3 weeks.  States otherwise he is doing well without any anginal or exertional symptoms, sensation of palpitations or arrhythmias, orthostatic symptoms, claudication-like symptoms, PND, orthopnea, lower extremity edema.  Blood pressure well controlled today.  Past Medical History:  Diagnosis Date  . AAA (abdominal aortic aneurysm) ([place])   . Aortic  stenosis, severe    a. 02/2017: s/p TAVR with Edwards Sapien 3 THV (size 26 mm, model # 9600TFX, serial # [id number])  . Arthritis   . Atherosclerotic peripheral vascular disease ([place])   . Atrial fibrillation, persistent ([place])    a. not a candidate for [place] given hx of GI bleed  . Bilateral carotid artery stenosis   . Bladder cancer ([place])   . COPD (chronic obstructive pulmonary disease) ([place])   . Gastroesophageal reflux disease without esophagitis 05/25/2019  . Hyperlipidemia   . Hypertension   . Hypothyroidism   . Lung cancer ([place])    Right  . Lung mass    Right  . Skin cancer   . Vitamin D deficiency     Past Surgical History:  Procedure Laterality Date  . ABDOMINAL AORTIC ANEURYSM REPAIR  08-09-09  . BACK SURGERY     x2  . CYSTOSCOPY W/ RETROGRADES Bilateral 09/02/2018   Procedure: CYSTOSCOPY WITH RETROGRADE PYELOGRAM;  Surgeon: Franchot Gallo, MD;  Location: [facility];  Service: Urology;  Laterality: Bilateral;  . CYSTOSCOPY W/ RETROGRADES Bilateral 03/20/2020   Procedure: CYSTOSCOPY WITH BILATERAL RETROGRADE PYELOGRAM;  Surgeon: Franchot Gallo, MD;  [facility];  Service: Urology;  Laterality: Bilateral;  . CYSTOSCOPY WITH BIOPSY N/A 09/02/2018   Procedure: CYSTOSCOPY WITH BIOPSY;  Surgeon: Franchot Gallo, MD;  Location: [facility];  Service: Urology;  Laterality: N/A;  . ESOPHAGOGASTRODUODENOSCOPY N/A 02/21/2017   Procedure: ESOPHAGOGASTRODUODENOSCOPY (EGD);  Surgeon: Jerene Bears, MD;  [facility]: [facility]COPY;  Service: Gastroenterology;  Laterality: N/A;  . HYDROCELE EXCISION / REPAIR    . LOBECTOMY  Right 03/30/2017   Procedure: RIGHT UPPER LOBECTOMY LUNG;  Surgeon: Melrose Nakayama, MD;  Location: [place];  Service: Thoracic;  Laterality: Right;  . SKIN CANCER EXCISION    . SPINE SURGERY    . TEE WITHOUT CARDIOVERSION N/A 02/24/2017   Procedure: TRANSESOPHAGEAL ECHOCARDIOGRAM (TEE);  Surgeon: Sherren Mocha, MD;  Location: [place];   Service: Open Heart Surgery;  Laterality: N/A;  . TRANSCATHETER AORTIC VALVE REPLACEMENT, TRANSFEMORAL N/A 02/24/2017   Procedure: TRANSCATHETER AORTIC VALVE REPLACEMENT, TRANSFEMORAL using a 36mm Edwards Sapien 3 Aortic Valve;  Surgeon: Sherren Mocha, MD;  Location: [place];  Service: Open Heart Surgery;  Laterality: N/A;  . TRANSURETHRAL RESECTION OF BLADDER TUMOR N/A 06/06/2016   Procedure: TRANSURETHRAL RESECTION OF BLADDER TUMOR (TURBT);  Surgeon: Franchot Gallo, MD;  [facility];  Service: Urology;  Laterality: N/A;  . TRANSURETHRAL RESECTION OF BLADDER TUMOR N/A 07/22/2016   Procedure: TRANSURETHRAL RESECTION OF BLADDER TUMOR (TURBT);  Surgeon: Franchot Gallo, MD;  [facility];  Service: Urology;  Laterality: N/A;  . TRANSURETHRAL RESECTION OF BLADDER TUMOR N/A 03/20/2020   Procedure: TRANSURETHRAL RESECTION OF BLADDER TUMOR (TURBT);  Surgeon: Franchot Gallo, MD;  [facility];  Service: Urology;  Laterality: N/A;  . VIDEO ASSISTED THORACOSCOPY (VATS)/WEDGE RESECTION Right 03/30/2017   Procedure: VIDEO ASSISTED THORACOSCOPY (VATS)/WEDGE RESECTION;  Surgeon: Melrose Nakayama, MD;  Location: [facility];  Service: Thoracic;  Laterality: Right;    Current Outpatient Medications  Medication Sig Dispense Refill  . alfuzosin (UROXATRAL) 10 MG 24 hr tablet Take 1 tablet (10 mg total) by mouth at bedtime. 30 tablet prn  . amLODipine (NORVASC) 10 MG tablet Take 1 tablet (10 mg total) by mouth daily. 90 tablet 1  . atorvastatin (LIPITOR) 40 MG tablet Take 1 tablet (40 mg total) by mouth daily. 90 tablet 1  . cholecalciferol (VITAMIN D) 1000 UNITS tablet Take 1 tablet (1,000 Units total) by mouth daily. 30 tablet 6  . ELIQUIS 5 MG TABS tablet Take 5 mg by mouth 2 (two) times daily.    . ferrous gluconate (FERGON) 324 MG tablet 1 po BID (Patient taking differently: Take 324 mg by mouth at bedtime. ) 180 tablet 1  . levothyroxine (SYNTHROID) 75 MCG tablet Take 1 tablet (75 mcg  total) by mouth daily. 90 tablet 1  . lisinopril (ZESTRIL) 40 MG tablet Take 1 tablet (40 mg total) by mouth daily. 90 tablet 1  . metoprolol succinate (TOPROL XL) 25 MG 24 hr tablet Take 0.5 tablets (12.5 mg total) by mouth daily. 45 tablet 1  . Multiple Vitamin (MULTIVITAMIN WITH MINERALS) TABS tablet Take 1 tablet by mouth daily. One A Day Multivitamin for Men    . Omega-3 Fatty Acids (FISH OIL) 1200 MG CAPS Take 2,400 mg by mouth daily.     Marland Kitchen oxybutynin (DITROPAN) 5 MG tablet Take 1 tablet (5 mg total) by mouth every 8 (eight) hours as needed for up to 15 doses for bladder spasms. 10 tablet 0  . pantoprazole (PROTONIX) 40 MG tablet TAKE  (1)  TABLET TWICE A DAY. (Patient taking differently: Take 40 mg by mouth in the morning and at bedtime. ) 180 tablet 1   No current facility-administered medications for this visit.   Allergies:  Lovenox [enoxaparin]   Social History: The patient  reports that he quit smoking about 14 years ago. His smoking use included cigarettes. He started smoking about 65 years ago. He has a 51.00 pack-year smoking history. He has never used  smokeless tobacco. He reports that he does not drink alcohol and does not use drugs.   Family History: The patient's family history includes Alcohol abuse in his father; COPD in his mother and sister; Cancer in his mother; Gout in his brother and brother; Heart attack in his father and son; Heart disease in his father; Hypertension in his brother; Lupus in his sister; Prostatitis in his brother.   ROS:  Please see the history of present illness. Otherwise, complete review of systems is positive for none.  All other systems are reviewed and negative.   Physical Exam: VS:  BP 118/62   Pulse 81   Ht 5' 10.5" (1.791 m)   Wt 168 lb 3.2 oz (76.3 kg)   SpO2 98%   BMI 23.79 kg/m , BMI Body mass index is 23.79 kg/m.  Wt Readings from Last 3 Encounters:  04/24/20 168 lb 3.2 oz (76.3 kg)  03/20/20 174 lb 2.6 oz (79 kg)  03/16/20 176  lb (79.8 kg)    General: Patient appears comfortable at rest. Neck: Supple, no elevated JVP or carotid bruits, no thyromegaly. Lungs: Clear to auscultation, nonlabored breathing at rest. Cardiac: Irregularly irregular rate and rhythm, no S3 or significant systolic murmur, no pericardial rub. Extremities: No pitting edema, distal pulses 2+. Skin: Warm and dry. Musculoskeletal: No kyphosis. Neuropsychiatric: Alert and oriented x3, affect grossly appropriate.  ECG:  EKG 09/29/2019 atrial fibrillation with a rate of 57.  Recent Labwork: 05/25/2019: TSH 4.890 03/19/2020: ALT 22; AST 27; BUN 15; Creatinine 1.23; Hemoglobin 13.4; Platelet Count 148; Potassium 4.5; Sodium 141     Component Value Date/Time   CHOL 91 (L) 11/24/2019 0905   CHOL 118 01/14/2013 1157   TRIG 59 11/24/2019 0905   TRIG 160 (H) 04/13/2014 0846   TRIG 178 (H) 01/14/2013 1157   HDL 27 (L) 11/24/2019 0905   HDL 32 (L) 04/13/2014 0846   HDL 30 (L) 01/14/2013 1157   CHOLHDL 3.4 11/24/2019 0905   LDLCALC 50 11/24/2019 0905   LDLCALC 61 04/13/2014 0846   LDLCALC 52 01/14/2013 1157    Other Studies Reviewed Today:  Vascular ultrasound 08/24/2019 to reassess endovascular aortic repair. Summary: Abdominal Aorta: The largest aortic measurement is 3.8 cm. Patent endovascular aneurysm repair with no evidence of endoleak. The largest aortic diameter remains essentially unchanged compared to prior exam. Previous diameter measurement was 3.9 cm obtained on 08/10/2018. Comparison Study: 08/10/2009 - Patent endovascular aneurysm repair with no evidence of endoleak. Largest aortic measurement: 3.9 cm   Carotid artery duplex 08/24/2019 Right Carotid: Velocities in the right ICA are consistent with a 1-39% stenosis. Nonhemodynamically significant plaque <50% noted in the CCA. The ECA appears <50% stenosed Left Carotid: Velocities in the left ICA are consistent with a 1-39% stenosis. Nonhemodynamically significant plaque <50% noted  in the CCA. The ECA appears <50% stenosed. Vertebrals: Bilateral vertebral arteries demonstrate antegrade flow. Subclavians: Right subclavian artery flow was disturbed. Normal flow hemodynamics were seen in the left subclavian artery.  Echocardiogram 02/25/18:  - Left ventricle: The cavity size was normal. Wall thickness was normal. Systolic function was normal. The estimated ejection fraction was in the range of 55% to 60%. Wall motion was normal; there were no regional wall motion abnormalities. There was no evidence of elevated ventricular filling pressure by Doppler parameters. - Aortic valve: A 55mm Edwards Sapienn 3 TAVR bioprosthesis was present There was trivial perivalvular regurgitation. Peak velocity (S): 220 cm/s. Mean gradient (S): 9 mm Hg. Peak gradient (S): 19  mm Hg. - Left atrium: The atrium was mildly dilated. Volume/bsa, ES (1-plane Simpson&'s, A4C): 40 ml/m^2. - Right ventricle: The cavity size was mildly dilated. Wall thickness was normal. - Right atrium: The atrium was mildly dilated. - Tricuspid valve: There was trivial regurgitation. - Pulmonary arteries: Systolic pressure was mildly increased. PA peak pressure: 32 mm Hg (S).  Assessment and Plan:  1. Nonrheumatic aortic valve stenosis   2. Permanent atrial fibrillation ([place])   3. CAD in native artery   4. Mixed hyperlipidemia   5. PVD (peripheral vascular disease) ([place])     1. Nonrheumatic aortic valve stenosis Status post TAVR with Edwards's APN 326 mm bioprosthesis present with trivial perivalvular regurgitation  on last echo 02/2018.  2. Permanent atrial fibrillation (HCC) Heart rate today 81 and irregularly irregular.  Denies any recent bleeding over the last 2 weeks he is status post TURBT by Dr. Diona Fanti for recurrent bladder CA.  States he had some bleeding after the surgery and had to stop Eliquis intermittently but the bleeding has resolved and he has resumed the Eliquis.   Continue Eliquis 5 mg p.o. twice daily, continue Toprol 12.5 mg p.o. daily.  Recent hemoglobin and hematocrit on 03/20/2019 2113.4 and 39.8  3. CAD in native artery Denies any anginal or exertional symptoms.  Not on aspirin due to systemic anticoagulation.  Continue Eliquis 5 mg p.o. twice daily.  Continue Toprol 12.5 mg daily.  4. Mixed hyperlipidemia Continue atorvastatin 40 mg p.o. daily.  Lipid panel 11/24/2019: TC 91, TG 59, HDL 27, LDL 50  5. PVD (peripheral vascular disease) ([place]) History of endovascular aortic repair recent EVAR duplex showed largest aortic measurement 3.8 cm.  Patent endovascular aneurysm repair with no evidence of endoleak.  Largest aortic measurement remains essentially unchanged compared to prior exam.  Previous diameter was measured at 3.9 cm on 08/10/2018  Medication Adjustments/Labs and Tests Ordered: Current medicines are reviewed at length with the patient today.  Concerns regarding medicines are outlined above.   Disposition: Follow-up with Dr. Domenic Polite or APP 6 months  Signed, Levell July, NP 04/24/2020 11:01 AM    [facility] [place], [place], [place] [address] Phone: [phone]; Fax: [phone]

## 2020-04-24 ENCOUNTER — Encounter: Payer: Self-pay | Admitting: Family Medicine

## 2020-04-24 ENCOUNTER — Ambulatory Visit: Payer: Medicare Other | Admitting: Family Medicine

## 2020-04-24 VITALS — BP 118/62 | HR 81 | Ht 70.5 in | Wt 168.2 lb

## 2020-04-24 DIAGNOSIS — E782 Mixed hyperlipidemia: Secondary | ICD-10-CM | POA: Diagnosis not present

## 2020-04-24 DIAGNOSIS — I2510 Atherosclerotic heart disease of native coronary artery without angina pectoris: Secondary | ICD-10-CM

## 2020-04-24 DIAGNOSIS — I350 Nonrheumatic aortic (valve) stenosis: Secondary | ICD-10-CM

## 2020-04-24 DIAGNOSIS — I739 Peripheral vascular disease, unspecified: Secondary | ICD-10-CM

## 2020-04-24 DIAGNOSIS — I4821 Permanent atrial fibrillation: Secondary | ICD-10-CM

## 2020-04-24 MED ORDER — APIXABAN 5 MG PO TABS: 5.0000 mg | ORAL_TABLET | Freq: Two times a day (BID) | ORAL | 6 refills | Status: DC

## 2020-04-24 NOTE — Patient Instructions (Signed)
Medication Instructions:  Continue all current medications.   Labwork: none  Testing/Procedures: none  Follow-Up: 6 months   Any Other Special Instructions Will Be Listed Below (If Applicable).   If you need a refill on your cardiac medications before your next appointment, please call your pharmacy.  

## 2020-05-01 ENCOUNTER — Other Ambulatory Visit: Payer: Self-pay

## 2020-05-01 ENCOUNTER — Encounter: Payer: Self-pay | Admitting: Urology

## 2020-05-01 ENCOUNTER — Ambulatory Visit (INDEPENDENT_AMBULATORY_CARE_PROVIDER_SITE_OTHER): Payer: Medicare Other | Admitting: Urology

## 2020-05-01 VITALS — BP 124/69 | HR 66 | Temp 98.7°F | Ht 70.5 in | Wt 158.0 lb

## 2020-05-01 DIAGNOSIS — C678 Malignant neoplasm of overlapping sites of bladder: Secondary | ICD-10-CM | POA: Diagnosis not present

## 2020-05-01 DIAGNOSIS — R350 Frequency of micturition: Secondary | ICD-10-CM | POA: Diagnosis not present

## 2020-05-01 LAB — URINALYSIS, ROUTINE W REFLEX MICROSCOPIC
Bilirubin, UA: NEGATIVE
Glucose, UA: NEGATIVE
Nitrite, UA: POSITIVE — AB
Specific Gravity, UA: 1.025 (ref 1.005–1.030)
Urobilinogen, Ur: 0.2 mg/dL (ref 0.2–1.0)
pH, UA: 5.5 (ref 5.0–7.5)

## 2020-05-01 LAB — MICROSCOPIC EXAMINATION
Epithelial Cells (non renal): NONE SEEN /hpf (ref 0–10)
Renal Epithel, UA: NONE SEEN /hpf
WBC, UA: >30 /hpf — AB (ref 0–5)

## 2020-05-01 NOTE — Progress Notes (Signed)
Urological Symptom Review  Patient is experiencing the following symptoms: Frequent urination Get up at night to urinate   Review of Systems  Gastrointestinal (upper)  : Negative for upper GI symptoms  Gastrointestinal (lower) : Negative for lower GI symptoms  Constitutional : Negative for symptoms  Skin: Negative for skin symptoms  Eyes: Negative for eye symptoms  Ear/Nose/Throat : Negative for Ear/Nose/Throat symptoms  Hematologic/Lymphatic: Negative for Hematologic/Lymphatic symptoms  Cardiovascular : Negative for cardiovascular symptoms  Respiratory : Negative for respiratory symptoms  Endocrine: Negative for endocrine symptoms  Musculoskeletal: Back pain Joint pain  Neurological: Negative for neurological symptoms  Psychologic: Negative for psychiatric symptoms

## 2020-05-01 NOTE — Addendum Note (Signed)
Addended by: Valentina Lucks on: 05/01/2020 03:56 PM   Modules accepted: Orders

## 2020-05-01 NOTE — Progress Notes (Signed)
H&P  Chief Complaint: F/U of Bladder Cancer  History of Present Illness:   11.09.2021: Repeat TURBT/gemcitabine 9.28.2021. Path--benign (inflammation).  Pt reports no gross hematuria for the last week. Pt notes an adequate FOS and feels he is able to empty his bladder completely. He notes improvement in his nocturia (down to 3x per week) and denies any significant LUTS at this time  IPSS Questionnaire (AUA-7): Over the past month.   1)  How often have you had a sensation of not emptying your bladder completely after you finish urinating?  0 - Not at all  2)  How often have you had to urinate again less than two hours after you finished urinating? 2 - Less than half the time  3)  How often have you found you stopped and started again several times when you urinated?  0 - Not at all  4) How difficult have you found it to postpone urination?  0 - Not at all  5) How often have you had a weak urinary stream?  3 - About half the time  6) How often have you had to push or strain to begin urination?  3 - About half the time  7) How many times did you most typically get up to urinate from the time you went to bed until the time you got up in the morning?  3 - 3 times  Total score:  0-7 mildly symptomatic   8-19 moderately symptomatic   20-35 severely symptomatic  QoL score: 2  Prior history:   He underwent emergent TURBT on 12.15.2017 for a large bladder cancer burden at his right bladder neck, posterior dome and left lateral wall. He did well after the procedure. He apparently been bleeding for 3 months. He is a cigarette smoker.   Pathology revealed high-grade, NMIBC. Muscle was present in the specimen.   He underwent repeat resection on 1.30.2018. 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   He was initiated on BCG and competed his 6 induction treatments on 3.27.2018.   5.15.2018 -he was found to have a small papillary recurrence on his  anterior bladder wall--he underwent cysto/cauterization of this on 5.17.2018.  7.31.2018. 1st 3 maintenance BCG's completed  9.18.2018: Office cysto negative  10.24.2018: completed 3 maintenance BCG Rx's  1.22.2019: Surveillance cystoscopy.  2.19.2019: Completed 3 maintenance BCG treatments  4.2.2019: He has had no blood or burning with urination since his BCG has been completed. He tolerated the BCG well.  2.25.2020--Cysto--small BT recurrence.   3.12.2020: TURBT/bilateral RGPs. Cytologies of renal washings nml. Bladder tumor LG NMIBC.   6.2.2020: Cysto negative.   10.6.2020: Cystoscopy negative  3.2021: Cystoscopy negative, cytology with atypia  9.21.2021: This gentleman comes in today for cystoscopy.  He has had no gross hematuria recently.  He did have some burning with urination 2 to 3 weeks ago but that has cleared.  He does complain of nocturia every hour or 2.  He does drink a fair amount of fluids in the afternoon and evening.  Past Medical History:  Diagnosis Date  . AAA (abdominal aortic aneurysm) ([place])   . Aortic stenosis, severe    a. 02/2017: s/p TAVR with Edwards Sapien 3 THV (size 26 mm, model # 9600TFX, serial # [id number])  . Arthritis   . Atherosclerotic peripheral vascular disease ([place])   . Atrial fibrillation, persistent ([place])    a. not a candidate for [place] given hx of GI bleed  . Bilateral carotid artery stenosis   .  Bladder cancer ([place])   . COPD (chronic obstructive pulmonary disease) ([place])   . Gastroesophageal reflux disease without esophagitis 05/25/2019  . Hyperlipidemia   . Hypertension   . Hypothyroidism   . Lung cancer ([place])    Right  . Lung mass    Right  . Skin cancer   . Vitamin D deficiency     Past Surgical History:  Procedure Laterality Date  . ABDOMINAL AORTIC ANEURYSM REPAIR  08-09-09  . BACK SURGERY     x2  . CYSTOSCOPY W/ RETROGRADES Bilateral 09/02/2018   Procedure: CYSTOSCOPY WITH RETROGRADE PYELOGRAM;  Surgeon: Franchot Gallo,  MD;  Location[facility];  Service: Urology;  Laterality: Bilateral;  . CYSTOSCOPY W/ RETROGRADES Bilateral 03/20/2020   Procedure: CYSTOSCOPY WITH BILATERAL RETROGRADE PYELOGRAM;  Surgeon: Franchot Gallo, MD;  [facility];  Service: Urology;  Laterality: Bilateral;  . CYSTOSCOPY WITH BIOPSY N/A 09/02/2018   Procedure: CYSTOSCOPY WITH BIOPSY;  Surgeon: Franchot Gallo, MD;  Location[facility];  Service: Urology;  Laterality: N/A;  . ESOPHAGOGASTRODUODENOSCOPY N/A 02/21/2017   Procedure: ESOPHAGOGASTRODUODENOSCOPY (EGD);  Surgeon: Jerene Bears, MD;  Location: [facility] ENDOSCOPY;  Service: Gastroenterology;  Laterality: N/A;  . HYDROCELE EXCISION / REPAIR    . LOBECTOMY Right 03/30/2017   Procedure: RIGHT UPPER LOBECTOMY LUNG;  Surgeon: Melrose Nakayama, MD;  Location: [place];  Service: Thoracic;  Laterality: Right;  . SKIN CANCER EXCISION    . SPINE SURGERY    . TEE WITHOUT CARDIOVERSION N/A 02/24/2017   Procedure: TRANSESOPHAGEAL ECHOCARDIOGRAM (TEE);  Surgeon: Sherren Mocha, MD;  Location: [place];  Service: Open Heart Surgery;  Laterality: N/A;  . TRANSCATHETER AORTIC VALVE REPLACEMENT, TRANSFEMORAL N/A 02/24/2017   Procedure: TRANSCATHETER AORTIC VALVE REPLACEMENT, TRANSFEMORAL using a 42mm Edwards Sapien 3 Aortic Valve;  Surgeon: Sherren Mocha, MD;  Location: [place];  Service: Open Heart Surgery;  Laterality: N/A;  . TRANSURETHRAL RESECTION OF BLADDER TUMOR N/A 06/06/2016   Procedure: TRANSURETHRAL RESECTION OF BLADDER TUMOR (TURBT);  Surgeon: Franchot Gallo, MD;  [facility];  Service: Urology;  Laterality: N/A;  . TRANSURETHRAL RESECTION OF BLADDER TUMOR N/A 07/22/2016   Procedure: TRANSURETHRAL RESECTION OF BLADDER TUMOR (TURBT);  Surgeon: Franchot Gallo, MD;  [facility];  Service: Urology;  Laterality: N/A;  . TRANSURETHRAL RESECTION OF BLADDER TUMOR N/A 03/20/2020   Procedure: TRANSURETHRAL RESECTION OF BLADDER TUMOR (TURBT);   Surgeon: Franchot Gallo, MD;  [facility];  Service: Urology;  Laterality: N/A;  . VIDEO ASSISTED THORACOSCOPY (VATS)/WEDGE RESECTION Right 03/30/2017   Procedure: VIDEO ASSISTED THORACOSCOPY (VATS)/WEDGE RESECTION;  Surgeon: Melrose Nakayama, MD;  Location: [place];  Service: Thoracic;  Laterality: Right;    Home Medications:  Allergies as of 05/01/2020      Reactions   Lovenox [enoxaparin]    HIT panel ordered 10/10      Medication List       Accurate as of May 01, 2020 12:51 PM. If you have any questions, ask your nurse or doctor.        alfuzosin 10 MG 24 hr tablet Commonly known as: UROXATRAL Take 1 tablet (10 mg total) by mouth at bedtime.   amLODipine 10 MG tablet Commonly known as: NORVASC Take 1 tablet (10 mg total) by mouth daily.   apixaban 5 MG Tabs tablet Commonly known as: Eliquis Take 1 tablet (5 mg total) by mouth 2 (two) times daily.   atorvastatin 40 MG tablet Commonly known as: LIPITOR Take 1 tablet (  40 mg total) by mouth daily.   cholecalciferol 1000 units tablet Commonly known as: VITAMIN D Take 1 tablet (1,000 Units total) by mouth daily.   ferrous gluconate 324 MG tablet Commonly known as: FERGON 1 po BID What changed:   how much to take  how to take this  when to take this  additional instructions   Fish Oil 1200 MG Caps Take 2,400 mg by mouth daily.   levothyroxine 75 MCG tablet Commonly known as: SYNTHROID Take 1 tablet (75 mcg total) by mouth daily.   lisinopril 40 MG tablet Commonly known as: ZESTRIL Take 1 tablet (40 mg total) by mouth daily.   metoprolol succinate 25 MG 24 hr tablet Commonly known as: Toprol XL Take 0.5 tablets (12.5 mg total) by mouth daily.   multivitamin with minerals Tabs tablet Take 1 tablet by mouth daily. One A Day Multivitamin for Men   oxybutynin 5 MG tablet Commonly known as: DITROPAN Take 1 tablet (5 mg total) by mouth every 8 (eight) hours as needed for up to 15 doses for  bladder spasms.   pantoprazole 40 MG tablet Commonly known as: PROTONIX TAKE  (1)  TABLET TWICE A DAY. What changed:   how much to take  how to take this  when to take this  additional instructions       Allergies:  Allergies  Allergen Reactions  . Lovenox [Enoxaparin]     HIT panel ordered 10/10    Family History  Problem Relation Age of Onset  . Heart disease Father        Heart Disease before age 69  . Alcohol abuse Father   . Heart attack Father   . Cancer Mother        Lung  . COPD Mother   . COPD Sister   . Lupus Sister   . Hypertension Brother   . Gout Brother   . Heart attack Son   . Gout Brother   . Prostatitis Brother     Social History:  reports that he quit smoking about 14 years ago. His smoking use included cigarettes. He started smoking about 65 years ago. He has a 51.00 pack-year smoking history. He has never used smokeless tobacco. He reports that he does not drink alcohol and does not use drugs.  ROS: A complete review of systems was performed.  All systems are negative except for pertinent findings as noted.  Physical Exam:  Vital signs in last 24 hours: There were no vitals taken for this visit. Constitutional:  Alert and oriented, No acute distress Respiratory: Normal respiratory effort Neurologic: Grossly intact, no focal deficits Psychiatric: Normal mood and affect  I have reviewed prior pt notes  I have reviewed notes from referring/previous physicians  I have reviewed urinalysis results  I have independently reviewed prior imaging/recent TURBT  I have reviewed prior pathology results    Impression/Assessment:  1. Pt stable, tolerated his last TURBT well, and presents minimal concern at this time. NED at present.  Plan:  1. Pt advised and reassured that path from TURBT was benign.  2. Pt encouraged to exercise in order to improve his energy levels.  3. Pt will continue to be monitored and will f/u in 4 months for OV  and cysto.

## 2020-05-04 LAB — CULTURE, URINE COMPREHENSIVE

## 2020-05-08 ENCOUNTER — Other Ambulatory Visit: Payer: Self-pay | Admitting: Urology

## 2020-05-08 ENCOUNTER — Telehealth: Payer: Self-pay

## 2020-05-08 DIAGNOSIS — N3000 Acute cystitis without hematuria: Secondary | ICD-10-CM

## 2020-05-08 MED ORDER — CEPHALEXIN 250 MG PO CAPS: 250.0000 mg | ORAL_CAPSULE | Freq: Three times a day (TID) | ORAL | 0 refills | Status: DC

## 2020-05-08 NOTE — Telephone Encounter (Signed)
Left message to return call 

## 2020-05-08 NOTE — Telephone Encounter (Signed)
-----   Message from Franchot Gallo, MD sent at 05/08/2020  9:07 AM EST ----- Notify pt that urinie had some bacteria--I sent in abx ----- Message ----- From: Dorisann Frames, RN Sent: 05/04/2020  11:29 AM EST To: Franchot Gallo, MD  Please review

## 2020-05-09 NOTE — Telephone Encounter (Signed)
-----   Message from Franchot Gallo, MD sent at 05/08/2020  9:07 AM EST ----- Notify pt that urinie had some bacteria--I sent in abx ----- Message ----- From: Dorisann Frames, RN Sent: 05/04/2020  11:29 AM EST To: Franchot Gallo, MD  Please review

## 2020-05-28 ENCOUNTER — Ambulatory Visit (INDEPENDENT_AMBULATORY_CARE_PROVIDER_SITE_OTHER): Payer: Medicare Other | Admitting: Nurse Practitioner

## 2020-05-28 ENCOUNTER — Other Ambulatory Visit: Payer: Self-pay

## 2020-05-28 ENCOUNTER — Encounter: Payer: Self-pay | Admitting: Nurse Practitioner

## 2020-05-28 VITALS — BP 134/67 | HR 104 | Temp 98.2°F | Resp 20 | Ht 70.0 in | Wt 167.0 lb

## 2020-05-28 DIAGNOSIS — Z6823 Body mass index (BMI) 23.0-23.9, adult: Secondary | ICD-10-CM

## 2020-05-28 DIAGNOSIS — I6523 Occlusion and stenosis of bilateral carotid arteries: Secondary | ICD-10-CM | POA: Diagnosis not present

## 2020-05-28 DIAGNOSIS — E034 Atrophy of thyroid (acquired): Secondary | ICD-10-CM

## 2020-05-28 DIAGNOSIS — J449 Chronic obstructive pulmonary disease, unspecified: Secondary | ICD-10-CM

## 2020-05-28 DIAGNOSIS — I10 Essential (primary) hypertension: Secondary | ICD-10-CM | POA: Diagnosis not present

## 2020-05-28 DIAGNOSIS — I350 Nonrheumatic aortic (valve) stenosis: Secondary | ICD-10-CM

## 2020-05-28 DIAGNOSIS — E782 Mixed hyperlipidemia: Secondary | ICD-10-CM

## 2020-05-28 DIAGNOSIS — E559 Vitamin D deficiency, unspecified: Secondary | ICD-10-CM

## 2020-05-28 DIAGNOSIS — I4821 Permanent atrial fibrillation: Secondary | ICD-10-CM

## 2020-05-28 DIAGNOSIS — K219 Gastro-esophageal reflux disease without esophagitis: Secondary | ICD-10-CM

## 2020-05-28 DIAGNOSIS — C3491 Malignant neoplasm of unspecified part of right bronchus or lung: Secondary | ICD-10-CM

## 2020-05-28 DIAGNOSIS — N400 Enlarged prostate without lower urinary tract symptoms: Secondary | ICD-10-CM

## 2020-05-28 LAB — LIPID PANEL

## 2020-05-28 MED ORDER — LISINOPRIL 40 MG PO TABS: 40.0000 mg | ORAL_TABLET | Freq: Every day | ORAL | 1 refills | Status: DC

## 2020-05-28 MED ORDER — AMLODIPINE BESYLATE 10 MG PO TABS: 10.0000 mg | ORAL_TABLET | Freq: Every day | ORAL | 1 refills | Status: DC

## 2020-05-28 MED ORDER — FERROUS GLUCONATE 324 (38 FE) MG PO TABS: 324.0000 mg | ORAL_TABLET | Freq: Every day | ORAL | 1 refills | Status: DC

## 2020-05-28 MED ORDER — PANTOPRAZOLE SODIUM 40 MG PO TBEC: DELAYED_RELEASE_TABLET | ORAL | 1 refills | Status: DC

## 2020-05-28 MED ORDER — ATORVASTATIN CALCIUM 40 MG PO TABS: 40.0000 mg | ORAL_TABLET | Freq: Every day | ORAL | 1 refills | Status: DC

## 2020-05-28 NOTE — Progress Notes (Signed)
Subjective:    Patient ID: [name] [name], male    DOB: Feb 02, 1941, 79 y.o.   MRN: [id number]   Chief Complaint: medical management of chronic issues      HPI:  1. Primary hypertension No c/o chest pain, sob or headache. Does  check blood pressure at home. Runs around 916-945 systolic. BP Readings from Last 3 Encounters:  05/28/20 134/67  05/01/20 124/69  04/24/20 118/62     2. Permanent atrial fibrillation (HCC) Has atrial fib. He denies palpitations or feeling of heart racing. He is on eliquis daly and denies any bleeding problems.  3. Bilateral carotid artery stenosis According to chart, he had doppler study was in 2016 at which time right was less than 60% occluded and left was less than 40%. He denies and syncopial or near syncopial episodes. Had repeat carotid artery duplex on 08/24/19 at cardiology office. Showed less then 40% stenosis bil.  4. Nonrheumatic aortic valve stenosis Last saw cardiology on 04/24/20. He had aortic repair in 08/2019 According to office note. He has slight regurge at valve repair. He was to remain on all meds and follow up in 6  Months.  5. Mixed hyperlipidemia Takes lipitor 90m daily and is doing well. No c/o side effects. He tries to watch his diet. Lab Results  Component Value Date   CHOL 91 (L) 11/24/2019   HDL 27 (L) 11/24/2019   LDLCALC 50 11/24/2019   TRIG 59 11/24/2019   CHOLHDL 3.4 11/24/2019     6. Chronic obstructive pulmonary disease, unspecified COPD type [place] He is currently on no inhalers. Denies cough and SOB.  7. Primary lung cancer, right ([place] Lung cancer was back in [place]. Saw oncology on 03/21/20. He had a right lobectomy with Lymoh node dissection. His last CT showed no concerning findings. He is to follow up in 1 year with repeat chest CT.  8. Gastroesophageal reflux disease without esophagitis He is on protonix daily and is doing well.  9. Hypothyroidism due to acquired atrophy of thyroid Is on synthroid  768m daily. Having no issues that he is aware of. Lab Results  Component Value Date   TSH 4.890 (H) 05/25/2019     10. Benign prostatic hyperplasia without lower urinary tract symptoms Is on alfuzosin dialy and is doing well. No problems voiding. Had another TURP done in September.  11. Vitamin D deficiency Takes a daily vitamin d supplement  12. BMI 22.0-22.9,adult Weight is up 9lbs from last check. . Wt Readings from Last 3 Encounters:  05/28/20 167 lb (75.8 kg)  05/01/20 158 lb (71.7 kg)  04/24/20 168 lb 3.2 oz (76.3 kg)   BMI Readings from Last 3 Encounters:  05/28/20 23.96 kg/m  05/01/20 22.35 kg/m  04/24/20 23.79 kg/m        Outpatient Encounter Medications as of 05/28/2020  Medication Sig  . alfuzosin (UROXATRAL) 10 MG 24 hr tablet Take 1 tablet (10 mg total) by mouth at bedtime.  . Marland KitchenmLODipine (NORVASC) 10 MG tablet Take 1 tablet (10 mg total) by mouth daily.  . Marland Kitchenpixaban (ELIQUIS) 5 MG TABS tablet Take 1 tablet (5 mg total) by mouth 2 (two) times daily.  . Marland Kitchentorvastatin (LIPITOR) 40 MG tablet Take 1 tablet (40 mg total) by mouth daily.  . cephALEXin (KEFLEX) 250 MG capsule Take 1 capsule (250 mg total) by mouth 3 (three) times daily.  . cholecalciferol (VITAMIN D) 1000 UNITS tablet Take 1 tablet (1,000 Units total) by mouth daily.  . ferrous  gluconate (FERGON) 324 MG tablet 1 po BID (Patient taking differently: Take 324 mg by mouth at bedtime. )  . levothyroxine (SYNTHROID) 75 MCG tablet Take 1 tablet (75 mcg total) by mouth daily.  Marland Kitchen lisinopril (ZESTRIL) 40 MG tablet Take 1 tablet (40 mg total) by mouth daily.  . metoprolol succinate (TOPROL XL) 25 MG 24 hr tablet Take 0.5 tablets (12.5 mg total) by mouth daily.  . Multiple Vitamin (MULTIVITAMIN WITH MINERALS) TABS tablet Take 1 tablet by mouth daily. One A Day Multivitamin for Men  . Omega-3 Fatty Acids (FISH OIL) 1200 MG CAPS Take 2,400 mg by mouth daily.   Marland Kitchen oxybutynin (DITROPAN) 5 MG tablet Take 1 tablet (5  mg total) by mouth every 8 (eight) hours as needed for up to 15 doses for bladder spasms.  . pantoprazole (PROTONIX) 40 MG tablet TAKE  (1)  TABLET TWICE A DAY. (Patient taking differently: Take 40 mg by mouth in the morning and at bedtime. )     Past Surgical History:  Procedure Laterality Date  . ABDOMINAL AORTIC ANEURYSM REPAIR  08-09-09  . BACK SURGERY     x2  . CYSTOSCOPY W/ RETROGRADES Bilateral 09/02/2018   Procedure: CYSTOSCOPY WITH RETROGRADE PYELOGRAM;  Surgeon: Franchot Gallo, MD;  Location[facility];  Service: Urology;  Laterality: Bilateral;  . CYSTOSCOPY W/ RETROGRADES Bilateral 03/20/2020   Procedure: CYSTOSCOPY WITH BILATERAL RETROGRADE PYELOGRAM;  Surgeon: Franchot Gallo, MD;  [facility];  Service: Urology;  Laterality: Bilateral;  . CYSTOSCOPY WITH BIOPSY N/A 09/02/2018   Procedure: CYSTOSCOPY WITH BIOPSY;  Surgeon: Franchot Gallo, MD;  Location[facility];  Service: Urology;  Laterality: N/A;  . ESOPHAGOGASTRODUODENOSCOPY N/A 02/21/2017   Procedure: ESOPHAGOGASTRODUODENOSCOPY (EGD);  Surgeon: Jerene Bears, MD;  Location: [facility]COPY;  Service: Gastroenterology;  Laterality: N/A;  . HYDROCELE EXCISION / REPAIR    . LOBECTOMY Right 03/30/2017   Procedure: RIGHT UPPER LOBECTOMY LUNG;  Surgeon: Melrose Nakayama, MD;  Location: [place];  Service: Thoracic;  Laterality: Right;  . SKIN CANCER EXCISION    . SPINE SURGERY    . TEE WITHOUT CARDIOVERSION N/A 02/24/2017   Procedure: TRANSESOPHAGEAL ECHOCARDIOGRAM (TEE);  Surgeon: Sherren Mocha, MD;  Location: [place];  Service: Open Heart Surgery;  Laterality: N/A;  . TRANSCATHETER AORTIC VALVE REPLACEMENT, TRANSFEMORAL N/A 02/24/2017   Procedure: TRANSCATHETER AORTIC VALVE REPLACEMENT, TRANSFEMORAL using a 43m Edwards Sapien 3 Aortic Valve;  Surgeon: CSherren Mocha MD;  [place]  Service: Open Heart Surgery;  Laterality: N/A;  . TRANSURETHRAL RESECTION OF BLADDER TUMOR N/A  06/06/2016   Procedure: TRANSURETHRAL RESECTION OF BLADDER TUMOR (TURBT);  Surgeon: SFranchot Gallo MD;  [facility];  Service: Urology;  Laterality: N/A;  . TRANSURETHRAL RESECTION OF BLADDER TUMOR N/A 07/22/2016   Procedure: TRANSURETHRAL RESECTION OF BLADDER TUMOR (TURBT);  Surgeon: SFranchot Gallo MD;  [facility];  Service: Urology;  Laterality: N/A;  . TRANSURETHRAL RESECTION OF BLADDER TUMOR N/A 03/20/2020   Procedure: TRANSURETHRAL RESECTION OF BLADDER TUMOR (TURBT);  Surgeon: DFranchot Gallo MD;  [facility];  Service: Urology;  Laterality: N/A;  . VIDEO ASSISTED THORACOSCOPY (VATS)/WEDGE RESECTION Right 03/30/2017   Procedure: VIDEO ASSISTED THORACOSCOPY (VATS)/WEDGE RESECTION;  Surgeon: HMelrose Nakayama MD;  [facility];  Service: Thoracic;  Laterality: Right;    Family History  Problem Relation Age of Onset  . Heart disease Father        Heart Disease before age 80 . Alcohol abuse  Father   . Heart attack Father   . Cancer Mother        Lung  . COPD Mother   . COPD Sister   . Lupus Sister   . Hypertension Brother   . Gout Brother   . Heart attack Son   . Gout Brother   . Prostatitis Brother     New complaints: None today  Social history: Lives his wife  Controlled substance contract: n/a    Review of Systems  Constitutional: Negative for diaphoresis.  Eyes: Negative for pain.  Respiratory: Negative for shortness of breath.   Cardiovascular: Negative for chest pain, palpitations and leg swelling.  Gastrointestinal: Negative for abdominal pain.  Endocrine: Negative for polydipsia.  Skin: Negative for rash.  Neurological: Negative for dizziness, weakness and headaches.  Hematological: Does not bruise/bleed easily.  All other systems reviewed and are negative.      Objective:   Physical Exam Vitals and nursing note reviewed.  Constitutional:      Appearance: Normal appearance. He is well-developed.  HENT:     Head:  Normocephalic.     Nose: Nose normal.  Eyes:     Pupils: Pupils are equal, round, and reactive to light.  Neck:     Thyroid: No thyroid mass or thyromegaly.     Vascular: No carotid bruit or JVD.     Trachea: Phonation normal.  Cardiovascular:     Rate and Rhythm: Normal rate and regular rhythm.  Pulmonary:     Effort: Pulmonary effort is normal. No respiratory distress.     Breath sounds: Normal breath sounds.  Abdominal:     General: Bowel sounds are normal.     Palpations: Abdomen is soft.     Tenderness: There is no abdominal tenderness.  Musculoskeletal:        General: Normal range of motion.     Cervical back: Normal range of motion and neck supple.  Lymphadenopathy:     Cervical: No cervical adenopathy.  Skin:    General: Skin is warm and dry.  Neurological:     Mental Status: He is alert and oriented to person, place, and time.  Psychiatric:        Behavior: Behavior normal.        Thought Content: Thought content normal.        Judgment: Judgment normal.     BP 134/67   Pulse (!) 104   Temp 98.2 F (36.8 C) (Temporal)   Resp 20   Ht _0  (1.778 m)   Wt 167 lb (75.8 kg)   SpO2 98%   BMI 23.96 kg/m        Assessment & Plan:  MYLEZ VENABLE comes in today with chief complaint of Medical Management of Chronic Issues   Diagnosis and orders addressed:  1. Primary hypertension Low sodium diet - amLODipine (NORVASC) 10 MG tablet; Take 1 tablet (10 mg total) by mouth daily.  Dispense: 90 tablet; Refill: 1 - CBC with Differential/Platelet - CMP14+EGFR  2. Permanent atrial fibrillation (HCC) Continue eliquis daily Avoid caffeine  3. Bilateral carotid artery stenosis Cardiologist usually monitors this  4. Nonrheumatic aortic valve stenosis Keep follow up with cardiology  5. Mixed hyperlipidemia Low fat diet - lisinopril (ZESTRIL) 40 MG tablet; Take 1 tablet (40 mg total) by mouth daily.  Dispense: 90 tablet; Refill: 1 - atorvastatin  (LIPITOR) 40 MG tablet; Take 1 tablet (40 mg total) by mouth daily.  Dispense: 90 tablet; Refill: 1 -  Lipid panel  6. Chronic obstructive pulmonary disease, unspecified COPD type ([place])  7. Primary lung cancer, right ([place]) Keep yarly follow up with oncology  8. Gastroesophageal reflux disease without esophagitis Avoid spicy foods Do not eat 2 hours prior to bedtime - pantoprazole (PROTONIX) 40 MG tablet; TAKE  (1)  TABLET TWICE A DAY.  Dispense: 180 tablet; Refill: 1  9. Hypothyroidism due to acquired atrophy of thyroid Labs oending - Thyroid Panel With TSH  10. Benign prostatic hyperplasia without lower urinary tract symptoms Keep follow up with urology  11. Vitamin D deficiency Continue daily vitamin d supplements  12. BMI 23.0-23.9, adult Discussed diet and exercise for person with BMI >25 Will recheck weight in 3-6 months        Labs pending Health Maintenance reviewed Diet and exercise encouraged  Follow up plan: 6 months   [place], FNP

## 2020-05-28 NOTE — Patient Instructions (Signed)

## 2020-05-29 ENCOUNTER — Telehealth: Payer: Self-pay

## 2020-05-29 DIAGNOSIS — K5900 Constipation, unspecified: Secondary | ICD-10-CM | POA: Diagnosis not present

## 2020-05-29 DIAGNOSIS — R1110 Vomiting, unspecified: Secondary | ICD-10-CM | POA: Diagnosis not present

## 2020-05-29 DIAGNOSIS — R109 Unspecified abdominal pain: Secondary | ICD-10-CM | POA: Diagnosis not present

## 2020-05-29 LAB — CBC WITH DIFFERENTIAL/PLATELET
Basophils Absolute: 0 10*3/uL (ref 0.0–0.2)
Basos: 0 %
EOS (ABSOLUTE): 0.2 10*3/uL (ref 0.0–0.4)
Eos: 2 %
Hematocrit: 41.8 % (ref 37.5–51.0)
Hemoglobin: 14.2 g/dL (ref 13.0–17.7)
Immature Grans (Abs): 0 10*3/uL (ref 0.0–0.1)
Immature Granulocytes: 0 %
Lymphocytes Absolute: 1.5 10*3/uL (ref 0.7–3.1)
Lymphs: 16 %
MCH: 31.6 pg (ref 26.6–33.0)
MCHC: 34 g/dL (ref 31.5–35.7)
MCV: 93 fL (ref 79–97)
Monocytes Absolute: 0.5 10*3/uL (ref 0.1–0.9)
Monocytes: 6 %
Neutrophils Absolute: 6.9 10*3/uL (ref 1.4–7.0)
Neutrophils: 76 %
Platelets: 180 10*3/uL (ref 150–450)
RBC: 4.5 x10E6/uL (ref 4.14–5.80)
RDW: 12.7 % (ref 11.6–15.4)
WBC: 9.1 10*3/uL (ref 3.4–10.8)

## 2020-05-29 LAB — LIPID PANEL
Chol/HDL Ratio: 3.5 ratio (ref 0.0–5.0)
Cholesterol, Total: 109 mg/dL (ref 100–199)
HDL: 31 mg/dL — ABNORMAL LOW (ref >39)
LDL Chol Calc (NIH): 58 mg/dL (ref 0–99)
Triglycerides: 107 mg/dL (ref 0–149)
VLDL Cholesterol Cal: 20 mg/dL (ref 5–40)

## 2020-05-29 LAB — CMP14+EGFR
ALT: 22 IU/L (ref 0–44)
AST: 30 IU/L (ref 0–40)
Albumin/Globulin Ratio: 1.2 (ref 1.2–2.2)
Albumin: 4.7 g/dL (ref 3.7–4.7)
Alkaline Phosphatase: 99 IU/L (ref 44–121)
BUN/Creatinine Ratio: 17 (ref 10–24)
BUN: 18 mg/dL (ref 8–27)
Bilirubin Total: 0.8 mg/dL (ref 0.0–1.2)
CO2: 22 mmol/L (ref 20–29)
Calcium: 9.9 mg/dL (ref 8.6–10.2)
Chloride: 103 mmol/L (ref 96–106)
Creatinine, Ser: 1.06 mg/dL (ref 0.76–1.27)
GFR calc Af Amer: 76 mL/min/{1.73_m2} (ref >59)
GFR calc non Af Amer: 66 mL/min/{1.73_m2} (ref >59)
Globulin, Total: 3.9 g/dL (ref 1.5–4.5)
Glucose: 114 mg/dL — ABNORMAL HIGH (ref 65–99)
Potassium: 4.5 mmol/L (ref 3.5–5.2)
Sodium: 142 mmol/L (ref 134–144)
Total Protein: 8.6 g/dL — ABNORMAL HIGH (ref 6.0–8.5)

## 2020-05-29 LAB — THYROID PANEL WITH TSH
Free Thyroxine Index: 1.3 (ref 1.2–4.9)
T3 Uptake Ratio: 23 % — ABNORMAL LOW (ref 24–39)
T4, Total: 5.7 ug/dL (ref 4.5–12.0)
TSH: 4.47 u[IU]/mL (ref 0.450–4.500)

## 2020-05-29 NOTE — Telephone Encounter (Signed)
Lmtcb.

## 2020-05-29 NOTE — Telephone Encounter (Signed)
Needs to go to urgent care if having abdominal pain.

## 2020-05-29 NOTE — Telephone Encounter (Signed)
Called and spoke with patient.  Patient states that he had nausea, vomiting and abdominal pain that started Sunday and subsided and returned on Monday evening and through today.  Patient states that he tried to eat a bowl of cereal and drink water and could not keep this down.  Patient states that he is having left abdominal pain, nausea, vomiting and no diarrhea.

## 2020-06-18 ENCOUNTER — Other Ambulatory Visit: Payer: Self-pay | Admitting: Nurse Practitioner

## 2020-06-18 DIAGNOSIS — E034 Atrophy of thyroid (acquired): Secondary | ICD-10-CM

## 2020-06-23 HISTORY — PX: CATARACT EXTRACTION BILATERAL W/ ANTERIOR VITRECTOMY: SHX1304

## 2020-08-06 ENCOUNTER — Other Ambulatory Visit: Payer: Self-pay | Admitting: *Deleted

## 2020-08-06 DIAGNOSIS — Z95828 Presence of other vascular implants and grafts: Secondary | ICD-10-CM

## 2020-08-06 DIAGNOSIS — I6529 Occlusion and stenosis of unspecified carotid artery: Secondary | ICD-10-CM

## 2020-08-23 ENCOUNTER — Ambulatory Visit: Payer: Medicare Other | Admitting: Physician Assistant

## 2020-08-23 ENCOUNTER — Other Ambulatory Visit: Payer: Self-pay

## 2020-08-23 ENCOUNTER — Ambulatory Visit (HOSPITAL_COMMUNITY)
Admission: RE | Admit: 2020-08-23 | Discharge: 2020-08-23 | Disposition: A | Discharge status: Home / Self Care | Payer: Medicare Other | Source: Ambulatory Visit | Attending: Surgery | Admitting: Surgery

## 2020-08-23 ENCOUNTER — Ambulatory Visit (INDEPENDENT_AMBULATORY_CARE_PROVIDER_SITE_OTHER)
Admission: RE | Admit: 2020-08-23 | Discharge: 2020-08-23 | Disposition: A | Discharge status: Home / Self Care | Payer: Medicare Other | Source: Ambulatory Visit | Attending: Surgery | Admitting: Surgery

## 2020-08-23 VITALS — BP 126/76 | HR 78 | Temp 97.9°F | Resp 20 | Ht 70.0 in | Wt 165.9 lb

## 2020-08-23 DIAGNOSIS — Z95828 Presence of other vascular implants and grafts: Secondary | ICD-10-CM

## 2020-08-23 DIAGNOSIS — I6529 Occlusion and stenosis of unspecified carotid artery: Secondary | ICD-10-CM

## 2020-08-23 NOTE — Progress Notes (Signed)
Office Note     CC:  follow up Requesting Provider:  Hassell Done, [name], *  HPI: [name] [name] is a 80 y.o. ([birth year]) male who presents routine follow-up. His history is significant for abdominal aortic aneurysm status post EVAR 2011.  In addition the patient has a history of mild carotid artery stenosis.  He has no previous history of stroke or TIA.   He was seen last year in March and the maximal AP diameter of his abdominal aorta was 3.76 cm.    Denies significant episodes of back pain and denies abdominal pain.  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    Lower extremity claudication.  He denies temporary monocular blindness, extremity weakness or paralysis, slurred speech or confusion.  History of TAVR 2018.  History of lumbar spine surgery  The pt is on a statin for cholesterol management.  The pt is not on a daily aspirin.   Other AC: Eliquis The pt is on Norvasc, Zestril for hypertension.   The pt is not diabetic.   Tobacco hx: Former smoker, quit 2007   Past Medical History:  Diagnosis Date  . AAA (abdominal aortic aneurysm) ([place])   . Aortic stenosis, severe    a. 02/2017: s/p TAVR with Edwards Sapien 3 THV (size 26 mm, model # 9600TFX, serial # [id number])  . Arthritis   . Atherosclerotic peripheral vascular disease ([place])   . Atrial fibrillation, persistent ([place])    a. not a candidate for [place] given hx of GI bleed  . Bilateral carotid artery stenosis   . Bladder cancer ([place])   . COPD (chronic obstructive pulmonary disease) ([place])   . Gastroesophageal reflux disease without esophagitis 05/25/2019  . Hyperlipidemia   . Hypertension   . Hypothyroidism   . Lung cancer ([place])    Right  . Lung mass    Right  . Skin cancer   . Vitamin D deficiency     Past Surgical History:  Procedure Laterality Date  . ABDOMINAL AORTIC ANEURYSM REPAIR  08-09-09  . BACK SURGERY     x2  . CYSTOSCOPY W/ RETROGRADES Bilateral 09/02/2018    Procedure: CYSTOSCOPY WITH RETROGRADE PYELOGRAM;  Surgeon: Franchot Gallo, MD;  Location: [facility];  Service: Urology;  Laterality: Bilateral;  . CYSTOSCOPY W/ RETROGRADES Bilateral 03/20/2020   Procedure: CYSTOSCOPY WITH BILATERAL RETROGRADE PYELOGRAM;  Surgeon: Franchot Gallo, MD;  [facility];  Service: Urology;  Laterality: Bilateral;  . CYSTOSCOPY WITH BIOPSY N/A 09/02/2018   Procedure: CYSTOSCOPY WITH BIOPSY;  Surgeon: Franchot Gallo, MD;  [facility];  Service: Urology;  Laterality: N/A;  . ESOPHAGOGASTRODUODENOSCOPY N/A 02/21/2017   Procedure: ESOPHAGOGASTRODUODENOSCOPY (EGD);  Surgeon: Jerene Bears, MD;  [facility];  Service: Gastroenterology;  Laterality: N/A;  . HYDROCELE EXCISION / REPAIR    . LOBECTOMY Right 03/30/2017   Procedure: RIGHT UPPER LOBECTOMY LUNG;  Surgeon: Melrose Nakayama, MD;  Location: [place];  Service: Thoracic;  Laterality: Right;  . SKIN CANCER EXCISION    . SPINE SURGERY    . TEE WITHOUT CARDIOVERSION N/A 02/24/2017   Procedure: TRANSESOPHAGEAL ECHOCARDIOGRAM (TEE);  Surgeon: Sherren Mocha, MD;  Location: [place];  Service: Open Heart Surgery;  Laterality: N/A;  . TRANSCATHETER AORTIC VALVE REPLACEMENT, TRANSFEMORAL N/A 02/24/2017   Procedure: TRANSCATHETER AORTIC VALVE REPLACEMENT, TRANSFEMORAL using a 52mm Edwards Sapien 3 Aortic Valve;  Surgeon: Sherren Mocha, MD;  Location: [place];  Service: Open Heart Surgery;  Laterality: N/A;  . TRANSURETHRAL RESECTION OF BLADDER TUMOR N/A 06/06/2016   Procedure: TRANSURETHRAL RESECTION OF BLADDER TUMOR (TURBT);  Surgeon: Franchot Gallo, MD;  [facility];  Service: Urology;  Laterality: N/A;  . TRANSURETHRAL RESECTION OF BLADDER TUMOR N/A 07/22/2016   Procedure: TRANSURETHRAL RESECTION OF BLADDER TUMOR (TURBT);  Surgeon: Franchot Gallo, MD;  [facility];  Service: Urology;  Laterality: N/A;  . TRANSURETHRAL RESECTION OF BLADDER TUMOR N/A  03/20/2020   Procedure: TRANSURETHRAL RESECTION OF BLADDER TUMOR (TURBT);  Surgeon: Franchot Gallo, MD;  [facility];  Service: Urology;  Laterality: N/A;  . VIDEO ASSISTED THORACOSCOPY (VATS)/WEDGE RESECTION Right 03/30/2017   Procedure: VIDEO ASSISTED THORACOSCOPY (VATS)/WEDGE RESECTION;  Surgeon: Melrose Nakayama, MD;  Location: [facility];  Service: Thoracic;  Laterality: Right;    Social History   Socioeconomic History  . Marital status: Married    Spouse name: Zaydyn Havey  . Number of children: 2  . Years of education: Not on file  . Highest education level: Not on file  Occupational History  . Occupation: Retired    Comment: Librarian, academic with [employer]  Tobacco Use  . Smoking status: Former Smoker    Packs/day: 1.00    Years: 51.00    Pack years: 51.00    Types: Cigarettes    Start date: 11/25/1954    Quit date: 10/27/2005    Years since quitting: 14.8  . Smokeless tobacco: Never Used  Vaping Use  . Vaping Use: Never used  Substance and Sexual Activity  . Alcohol use: No    Alcohol/week: 0.0 standard drinks    Comment: none since 1999  . Drug use: No  . Sexual activity: Yes  Other Topics Concern  . Not on file  Social History Narrative   2 sons that are deceased. 4 grandchildren and 3 great grandchildren. Lives at home with his wife.    Social Determinants of Health   Financial Resource Strain: Not on file  Food Insecurity: Not on file  Transportation Needs: Not on file  Physical Activity: Not on file  Stress: Not on file  Social Connections: Not on file  Intimate Partner Violence: Not on file   Family History  Problem Relation Age of Onset  . Heart disease Father        Heart Disease before age 22  . Alcohol abuse Father   . Heart attack Father   . Cancer Mother        Lung  . COPD Mother   . COPD Sister   . Lupus Sister   . Hypertension Brother   . Gout Brother   . Heart attack Son   . Gout Brother   . Prostatitis Brother      Current Outpatient Medications  Medication Sig Dispense Refill  . alfuzosin (UROXATRAL) 10 MG 24 hr tablet Take 1 tablet (10 mg total) by mouth at bedtime. 30 tablet prn  . amLODipine (NORVASC) 10 MG tablet Take 1 tablet (10 mg total) by mouth daily. 90 tablet 1  . atorvastatin (LIPITOR) 40 MG tablet Take 1 tablet (40 mg total) by mouth daily. 90 tablet 1  . cholecalciferol (VITAMIN D) 1000 UNITS tablet Take 1 tablet (1,000 Units total) by mouth daily. 30 tablet 6  . ferrous gluconate (FERGON) 324 MG tablet TAKE  (1)  TABLET TWICE A DAY. 180 tablet 0  . levothyroxine (SYNTHROID) 75 MCG tablet TAKE 1 TABLET ONCE DAILY 90 tablet 3  . lisinopril (ZESTRIL) 40 MG tablet Take  1 tablet (40 mg total) by mouth daily. 90 tablet 1  . metoprolol succinate (TOPROL XL) 25 MG 24 hr tablet Take 0.5 tablets (12.5 mg total) by mouth daily. 45 tablet 1  . Multiple Vitamin (MULTIVITAMIN WITH MINERALS) TABS tablet Take 1 tablet by mouth daily. One A Day Multivitamin for Men    . Omega-3 Fatty Acids (FISH OIL) 1200 MG CAPS Take 2,400 mg by mouth daily.     . pantoprazole (PROTONIX) 40 MG tablet TAKE  (1)  TABLET TWICE A DAY. 180 tablet 1   No current facility-administered medications for this visit.    Allergies  Allergen Reactions  . Lovenox [Enoxaparin]     HIT panel ordered 10/10     REVIEW OF SYSTEMS:   [X]  denotes positive finding, [ ]  denotes negative finding Cardiac  Comments:  Chest pain or chest pressure:    Shortness of breath upon exertion:    Short of breath when lying flat:    Irregular heart rhythm:        Vascular    Pain in calf, thigh, or hip brought on by ambulation:    Pain in feet at night that wakes you up from your sleep:     Blood clot in your veins:    Leg swelling:         Pulmonary    Oxygen at home:    Productive cough:     Wheezing:         Neurologic    Sudden weakness in arms or legs:     Sudden numbness in arms or legs:     Sudden onset of difficulty  speaking or slurred speech:    Temporary loss of vision in one eye:     Problems with dizziness:         Gastrointestinal    Blood in stool:     Vomited blood:         Genitourinary    Burning when urinating:     Blood in urine:        Psychiatric    Major depression:         Hematologic    Bleeding problems:    Problems with blood clotting too easily:        Skin    Rashes or ulcers:        Constitutional    Fever or chills:      PHYSICAL EXAMINATION:  There were no vitals filed for this visit.  General:  WDWN in NAD; vital signs documented above Gait: Not observed HENT: WNL, normocephalic Pulmonary: normal non-labored breathing , without Rales, rhonchi,  wheezing Cardiac: irregular HR, without  Murmurs without carotid bruit Abdomen: soft, NT, no masses Skin: without rashes Vascular Exam/Pulses: 2+ radial, femoral, dorsalis pedis and posterior tibial pulses bilaterally Extremities: without ischemic changes, without Gangrene , without cellulitis; without open wounds;  Musculoskeletal: no muscle wasting or atrophy  Neurologic: A&O X 3;  No focal weakness or paresthesias are detected Psychiatric:  The pt has Normal affect.   Non-Invasive Vascular Imaging:   08/23/2020 Endovascular Aortic Repair (EVAR):  +----------+----------------+-------------------+-------------------+       Diameter AP (cm)Diameter Trans (cm)Velocities (cm/sec)  +----------+----------------+-------------------+-------------------+  Aorta   3.93      4.16        39           +----------+----------------+-------------------+-------------------+  Right Limb1.52      1.74        80           +----------+----------------+-------------------+-------------------+  Left Limb 1.49      1.37        54           +----------+----------------+-------------------+-------------------+   Summary:  Abdominal Aorta:  Patent endovascular aneurysm repair with no evidence of  endoleak. Previous diameter measurement was 3.36 x 3.76 cm obtained on  08/3819.    Right Carotid: Velocities in the right ICA are consistent with a 1-39% stenosis.   Left Carotid: Velocities in the left ICA are consistent with a 1-39% stenosis.   Vertebrals: Bilateral vertebral arteries demonstrate antegrade flow.  Subclavians: Normal flow hemodynamics were seen in bilateral subclavian arteries.   *See table(s) above for measurements and observations.    ASSESSMENT/PLAN:: 80 y.o. male here for follow up for endovascular abdominal aortic repair in.  There has been increase in the maximal diameter of his abdominal aorta from 3.76 cm one year ago to 4.16 cm today.  No evidence of endo leak.  No symptoms referable to abdominal aortic aneurysm repair.  Symptoms of TIA or stroke.  No significant carotid artery stenosis.  Open 1 year with ultrasound of abdominal aorta.  Follow-up in 2 years for bilateral carotid artery duplex.  Symptoms of stroke.  He is encouraged to call EMS should he develop sudden abdominal or back pain or symptoms of stroke.    Barbie Banner, PA-C [facility] [phone]  Clinic MD:  Oneida Alar

## 2020-08-27 NOTE — Progress Notes (Signed)
History of Present Illness: Here for follow-up of bladder cancer.  Since his last visit he has had no real issues with urination.  He has a good stream, feels like he empties well.  No gross hematuria.                                        Prior history:  He underwent emergent TURBT on 12.15.2017 for a large bladder cancer burden at his right bladder neck, posterior dome and left lateral wall. He did well after the procedure. He apparently been bleeding for 3 months. He is a cigarette smoker.   Pathology revealed high-grade, NMIBC. Muscle was present in the specimen.   He underwent repeat resection on 1.30.2018. 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   He was initiated on BCG and competed his 6 induction treatments on 3.27.2018.   5.15.2018 -he was found to have a small papillary recurrence on his anterior bladder wall--he underwent cysto/cauterization of this on 5.17.2018.  7.31.2018. 1st 3 maintenance BCG's completed  9.18.2018: Office cysto negative  10.24.2018: completed 3 maintenance BCG Rx's  1.22.2019: Surveillance cystoscopy.  2.19.2019: Completed 3 maintenance BCG treatments  4.2.2019: He has had no blood or burning with urination since his BCG has been completed. He tolerated the BCG well.  2.25.2020--Cysto--small BT recurrence.   3.12.2020: TURBT/bilateral RGPs. Cytologies of renal washings nml. Bladder tumor LG NMIBC.   6.2.2020: Cysto negative.   10.6.2020:Cystoscopy negative  3.2021:Cystoscopy negative, cytology with atypia  11.09.2021: Repeat TURBT/gemcitabine 9.28.2021. Path--benign (inflammation).  Pt reports no gross hematuria for the last week.    Past Medical History:  Diagnosis Date  . AAA (abdominal aortic aneurysm) ([place])   . Aortic stenosis, severe    a. 02/2017: s/p TAVR with Edwards Sapien 3 THV (size 26 mm, model # 9600TFX, serial # [id number])  . Arthritis   .  Atherosclerotic peripheral vascular disease ([place])   . Atrial fibrillation, persistent ([place])    a. not a candidate for [place] given hx of GI bleed  . Bilateral carotid artery stenosis   . Bladder cancer ([place])   . COPD (chronic obstructive pulmonary disease) ([place])   . Gastroesophageal reflux disease without esophagitis 05/25/2019  . Hyperlipidemia   . Hypertension   . Hypothyroidism   . Lung cancer ([place])    Right  . Lung mass    Right  . Skin cancer   . Vitamin D deficiency     Past Surgical History:  Procedure Laterality Date  . ABDOMINAL AORTIC ANEURYSM REPAIR  08-09-09  . BACK SURGERY     x2  . CYSTOSCOPY W/ RETROGRADES Bilateral 09/02/2018   Procedure: CYSTOSCOPY WITH RETROGRADE PYELOGRAM;  Surgeon: Franchot Gallo, MD;  Location: [facility];  Service: Urology;  Laterality: Bilateral;  . CYSTOSCOPY W/ RETROGRADES Bilateral 03/20/2020   Procedure: CYSTOSCOPY WITH BILATERAL RETROGRADE PYELOGRAM;  Surgeon: Franchot Gallo, MD;  [facility];  Service: Urology;  Laterality: Bilateral;  . CYSTOSCOPY WITH BIOPSY N/A 09/02/2018   Procedure: CYSTOSCOPY WITH BIOPSY;  Surgeon: Franchot Gallo, MD;  Location: [facility];  Service: Urology;  Laterality: N/A;  . ESOPHAGOGASTRODUODENOSCOPY N/A 02/21/2017   Procedure: ESOPHAGOGASTRODUODENOSCOPY (EGD);  Surgeon: Jerene Bears, MD;  Location: [facility]OSCOPY;  Service: Gastroenterology;  Laterality: N/A;  . HYDROCELE EXCISION / REPAIR    . LOBECTOMY Right 03/30/2017   Procedure: RIGHT UPPER  LOBECTOMY LUNG;  Surgeon: Melrose Nakayama, MD;  Location: [place];  Service: Thoracic;  Laterality: Right;  . SKIN CANCER EXCISION    . SPINE SURGERY    . TEE WITHOUT CARDIOVERSION N/A 02/24/2017   Procedure: TRANSESOPHAGEAL ECHOCARDIOGRAM (TEE);  Surgeon: Sherren Mocha, MD;  Location: [place];  Service: Open Heart Surgery;  Laterality: N/A;  . TRANSCATHETER AORTIC VALVE REPLACEMENT, TRANSFEMORAL N/A 02/24/2017   Procedure:  TRANSCATHETER AORTIC VALVE REPLACEMENT, TRANSFEMORAL using a 68mm Edwards Sapien 3 Aortic Valve;  Surgeon: Sherren Mocha, MD;  Location: [place];  Service: Open Heart Surgery;  Laterality: N/A;  . TRANSURETHRAL RESECTION OF BLADDER TUMOR N/A 06/06/2016   Procedure: TRANSURETHRAL RESECTION OF BLADDER TUMOR (TURBT);  Surgeon: Franchot Gallo, MD;  [facility];  Service: Urology;  Laterality: N/A;  . TRANSURETHRAL RESECTION OF BLADDER TUMOR N/A 07/22/2016   Procedure: TRANSURETHRAL RESECTION OF BLADDER TUMOR (TURBT);  Surgeon: Franchot Gallo, MD;  [facility];  Service: Urology;  Laterality: N/A;  . TRANSURETHRAL RESECTION OF BLADDER TUMOR N/A 03/20/2020   Procedure: TRANSURETHRAL RESECTION OF BLADDER TUMOR (TURBT);  Surgeon: Franchot Gallo, MD;  [facility];  Service: Urology;  Laterality: N/A;  . VIDEO ASSISTED THORACOSCOPY (VATS)/WEDGE RESECTION Right 03/30/2017   Procedure: VIDEO ASSISTED THORACOSCOPY (VATS)/WEDGE RESECTION;  Surgeon: Melrose Nakayama, MD;  Location: [place];  Service: Thoracic;  Laterality: Right;    Home Medications:  Allergies as of 08/28/2020      Reactions   Lovenox [enoxaparin]    HIT panel ordered 10/10      Medication List       Accurate as of August 27, 2020  8:59 PM. If you have any questions, ask your nurse or doctor.        alfuzosin 10 MG 24 hr tablet Commonly known as: UROXATRAL Take 1 tablet (10 mg total) by mouth at bedtime.   amLODipine 10 MG tablet Commonly known as: NORVASC Take 1 tablet (10 mg total) by mouth daily.   apixaban 5 MG Tabs tablet Commonly known as: ELIQUIS   atorvastatin 40 MG tablet Commonly known as: LIPITOR Take 1 tablet (40 mg total) by mouth daily.   cholecalciferol 1000 units tablet Commonly known as: VITAMIN D Take 1 tablet (1,000 Units total) by mouth daily.   ferrous gluconate 324 MG tablet Commonly known as: FERGON TAKE  (1)  TABLET TWICE A DAY.   Fish Oil 1200 MG Caps Take 2,400 mg by  mouth daily.   levothyroxine 75 MCG tablet Commonly known as: SYNTHROID TAKE 1 TABLET ONCE DAILY   lisinopril 40 MG tablet Commonly known as: ZESTRIL Take 1 tablet (40 mg total) by mouth daily.   magnesium citrate Soln Take by mouth.   metoprolol succinate 25 MG 24 hr tablet Commonly known as: Toprol XL Take 0.5 tablets (12.5 mg total) by mouth daily.   multivitamin with minerals Tabs tablet Take 1 tablet by mouth daily. One A Day Multivitamin for Men   ondansetron 8 MG disintegrating tablet Commonly known as: ZOFRAN-ODT Take 8 mg by mouth every 8 (eight) hours as needed.   oxybutynin 5 MG tablet Commonly known as: DITROPAN   pantoprazole 40 MG tablet Commonly known as: PROTONIX TAKE  (1)  TABLET TWICE A DAY.       Allergies:  Allergies  Allergen Reactions  . Lovenox [Enoxaparin]     HIT panel ordered 10/10    Family History  Problem Relation Age of Onset  . Heart disease Father  Heart Disease before age 82  . Alcohol abuse Father   . Heart attack Father   . Cancer Mother        Lung  . COPD Mother   . COPD Sister   . Lupus Sister   . Hypertension Brother   . Gout Brother   . Heart attack Son   . Gout Brother   . Prostatitis Brother     Social History:  reports that he quit smoking about 14 years ago. His smoking use included cigarettes. He started smoking about 65 years ago. He has a 51.00 pack-year smoking history. He has never used smokeless tobacco. He reports that he does not drink alcohol and does not use drugs.  ROS: A complete review of systems was performed.  All systems are negative except for pertinent findings as noted.  Physical Exam:  Vital signs in last 24 hours: There were no vitals taken for this visit. Constitutional:  Alert and oriented, No acute distress Cardiovascular: Regular rate  Respiratory: Normal respiratory effort GI: Abdomen is soft, nontender, nondistended, no abdominal masses. No CVAT.  Genitourinary: Normal  male phallus, testes are descended bilaterally and non-tender and without masses, scrotum is normal in appearance without lesions or masses, perineum is normal on inspection. Lymphatic: No lymphadenopathy Neurologic: Grossly intact, no focal deficits Psychiatric: Normal mood and affect  Cystoscopy Procedure Note:  Indication: Surveillance of history of bladder cancer  After informed consent and discussion of the procedure and its risks, [name] [name] was positioned and prepped in the standard fashion.  Cystoscopy was performed with a flexible cystoscope.   Findings: Urethra: No stricture or lesion Prostate: Obstructive with bilobar hypertrophy Bladder neck: No stenosis Ureteral orifices: Normal bilaterally Bladder: Smooth urothelium, no evidence of lesions or foreign bodies.  The patient tolerated the procedure well.   I have reviewed prior pt notes  I have reviewed notes from referring/previous physicians  I have reviewed urinalysis results  I have independently reviewed prior imaging  I have reviewed prior PSA results  I have reviewed prior urine culture     Impression/Assessment:  History of urothelial carcinoma the bladder, no evidence for recurrence today  Plan:  Bladder washings were sent for cytology  I will see back in 6 months for cystoscopy

## 2020-08-28 ENCOUNTER — Ambulatory Visit (INDEPENDENT_AMBULATORY_CARE_PROVIDER_SITE_OTHER): Payer: Medicare Other | Admitting: Urology

## 2020-08-28 ENCOUNTER — Other Ambulatory Visit: Payer: Self-pay

## 2020-08-28 ENCOUNTER — Encounter: Payer: Self-pay | Admitting: Urology

## 2020-08-28 VITALS — BP 152/59 | HR 90 | Temp 98.0°F | Ht 70.0 in | Wt 166.0 lb

## 2020-08-28 DIAGNOSIS — N3000 Acute cystitis without hematuria: Secondary | ICD-10-CM

## 2020-08-28 DIAGNOSIS — C678 Malignant neoplasm of overlapping sites of bladder: Secondary | ICD-10-CM

## 2020-08-28 LAB — URINALYSIS, ROUTINE W REFLEX MICROSCOPIC
Bilirubin, UA: NEGATIVE
Glucose, UA: NEGATIVE
Nitrite, UA: NEGATIVE
Protein,UA: NEGATIVE
RBC, UA: NEGATIVE
Specific Gravity, UA: 1.02 (ref 1.005–1.030)
Urobilinogen, Ur: 1 mg/dL (ref 0.2–1.0)
pH, UA: 6 (ref 5.0–7.5)

## 2020-08-28 LAB — MICROSCOPIC EXAMINATION
Epithelial Cells (non renal): NONE SEEN /hpf (ref 0–10)
RBC, Urine: NONE SEEN /hpf (ref 0–2)
Renal Epithel, UA: NONE SEEN /hpf
WBC, UA: >30 /hpf — AB (ref 0–5)

## 2020-08-28 MED ORDER — CIPROFLOXACIN HCL 500 MG PO TABS
500.0000 mg | ORAL_TABLET | Freq: Once | ORAL | Status: AC
  Administered 2020-08-28: 500 mg via ORAL

## 2020-08-28 MED ORDER — SULFAMETHOXAZOLE-TRIMETHOPRIM 800-160 MG PO TABS: 1.0000 | ORAL_TABLET | Freq: Two times a day (BID) | ORAL | 0 refills | Status: DC

## 2020-08-28 NOTE — Addendum Note (Signed)
Addended by: Franchot Gallo on: 08/28/2020 01:45 PM   Modules accepted: Orders

## 2020-08-28 NOTE — Progress Notes (Signed)
Urological Symptom Review  Patient is experiencing the following symptoms: None    Review of Systems  Gastrointestinal (upper)  : Negative for upper GI symptoms  Gastrointestinal (lower) : Negative for lower GI symptoms  Constitutional : Negative for symptoms  Skin: Negative for skin symptoms  Eyes: Blurred vision  Ear/Nose/Throat : Sinus problems  Hematologic/Lymphatic: Negative for Hematologic/Lymphatic symptoms  Cardiovascular : Negative for cardiovascular symptoms  Respiratory : Negative for respiratory symptoms  Endocrine: Negative for endocrine symptoms  Musculoskeletal: Negative for musculoskeletal symptoms  Neurological: Negative for neurological symptoms  Psychologic: Negative for psychiatric symptoms

## 2020-08-29 LAB — CYTOLOGY, URINE

## 2020-08-31 LAB — URINE CULTURE

## 2020-09-04 NOTE — Progress Notes (Signed)
Sent via mychart and mail. 

## 2020-09-13 ENCOUNTER — Other Ambulatory Visit: Payer: Self-pay | Admitting: Nurse Practitioner

## 2020-10-09 DIAGNOSIS — H2513 Age-related nuclear cataract, bilateral: Secondary | ICD-10-CM | POA: Diagnosis not present

## 2020-10-09 DIAGNOSIS — H53001 Unspecified amblyopia, right eye: Secondary | ICD-10-CM | POA: Diagnosis not present

## 2020-10-30 DIAGNOSIS — I739 Peripheral vascular disease, unspecified: Secondary | ICD-10-CM | POA: Insufficient documentation

## 2020-10-30 DIAGNOSIS — I25118 Atherosclerotic heart disease of native coronary artery with other forms of angina pectoris: Secondary | ICD-10-CM | POA: Insufficient documentation

## 2020-10-30 DIAGNOSIS — Z952 Presence of prosthetic heart valve: Secondary | ICD-10-CM | POA: Insufficient documentation

## 2020-10-30 NOTE — Progress Notes (Signed)
Cardiology Office Note   Date:  10/31/2020   ID:  [name] [name], DOB 05/06/1941, MRN [id number]  PCP:  [name], FNP  Cardiologist:   Kate Sable, MD (Inactive)   Chief Complaint  Patient presents with  . Atrial Fibrillation      History of Present Illness: [name] [name] is a 80 y.o. male with a history of TAVR, CAD, permanent atrial fibrillation, HLD, PVD, history of aneurysm repair of abdominal aorta using endovascular graft.  History of lung CA right lung.  History of recurrent bladder cancer with TURBT, history of smoking, COPD, HTN.  This is his first appt with me.  He does have some shortness of breath which is chronic and he has lung disease and has had lung surgery. The patient denies any new symptoms such as chest discomfort, neck or arm discomfort. There has been no new shortness of breath, PND or orthopnea. There have been no reported palpitations, presyncope or syncope.    Past Medical History:  Diagnosis Date  . AAA (abdominal aortic aneurysm) ([place])   . Aortic stenosis, severe    a. 02/2017: s/p TAVR with Edwards Sapien 3 THV (size 26 mm, model # 9600TFX, serial # [id number])  . Arthritis   . Atherosclerotic peripheral vascular disease ([place])   . Atrial fibrillation, persistent ([place])    a. not a candidate for [place] given hx of GI bleed  . Bilateral carotid artery stenosis   . Bladder cancer ([place])   . COPD (chronic obstructive pulmonary disease) ([place])   . Gastroesophageal reflux disease without esophagitis 05/25/2019  . Hyperlipidemia   . Hypertension   . Hypothyroidism   . Lung cancer ([place])    Right  . Lung mass    Right  . Skin cancer   . Vitamin D deficiency     Past Surgical History:  Procedure Laterality Date  . ABDOMINAL AORTIC ANEURYSM REPAIR  08-09-09  . BACK SURGERY     x2  . CYSTOSCOPY W/ RETROGRADES Bilateral 09/02/2018   Procedure: CYSTOSCOPY WITH RETROGRADE PYELOGRAM;  Surgeon: Franchot Gallo, MD;  Location[facility];  Service: Urology;  Laterality: Bilateral;  . CYSTOSCOPY W/ RETROGRADES Bilateral 03/20/2020   Procedure: CYSTOSCOPY WITH BILATERAL RETROGRADE PYELOGRAM;  Surgeon: Franchot Gallo, MD;  [facility];  Service: Urology;  Laterality: Bilateral;  . CYSTOSCOPY WITH BIOPSY N/A 09/02/2018   Procedure: CYSTOSCOPY WITH BIOPSY;  Surgeon: Franchot Gallo, MD;  Location: [facility];  Service: Urology;  Laterality: N/A;  . ESOPHAGOGASTRODUODENOSCOPY N/A 02/21/2017   Procedure: ESOPHAGOGASTRODUODENOSCOPY (EGD);  Surgeon: Jerene Bears, MD;  Location: [facility]COPY;  Service: Gastroenterology;  Laterality: N/A;  . HYDROCELE EXCISION / REPAIR    . LOBECTOMY Right 03/30/2017   Procedure: RIGHT UPPER LOBECTOMY LUNG;  Surgeon: Melrose Nakayama, MD;  Location: [place];  Service: Thoracic;  Laterality: Right;  . SKIN CANCER EXCISION    . SPINE SURGERY    . TEE WITHOUT CARDIOVERSION N/A 02/24/2017   Procedure: TRANSESOPHAGEAL ECHOCARDIOGRAM (TEE);  Surgeon: Sherren Mocha, MD;  Location: [place];  Service: Open Heart Surgery;  Laterality: N/A;  . TRANSCATHETER AORTIC VALVE REPLACEMENT, TRANSFEMORAL N/A 02/24/2017   Procedure: TRANSCATHETER AORTIC VALVE REPLACEMENT, TRANSFEMORAL using a 64mm Edwards Sapien 3 Aortic Valve;  Surgeon: Sherren Mocha, MD;  Location: [place];  Service: Open Heart Surgery;  Laterality: N/A;  . TRANSURETHRAL RESECTION OF BLADDER TUMOR N/A 06/06/2016   Procedure: TRANSURETHRAL RESECTION OF BLADDER TUMOR (TURBT);  Surgeon: Annie Main  Dahlstedt, MD;  [facility];  Service: Urology;  Laterality: N/A;  . TRANSURETHRAL RESECTION OF BLADDER TUMOR N/A 07/22/2016   Procedure: TRANSURETHRAL RESECTION OF BLADDER TUMOR (TURBT);  Surgeon: Franchot Gallo, MD;  [facility];  Service: Urology;  Laterality: N/A;  . TRANSURETHRAL RESECTION OF BLADDER TUMOR N/A 03/20/2020   Procedure: TRANSURETHRAL RESECTION OF BLADDER TUMOR (TURBT);  Surgeon: Franchot Gallo,  MD;  [facility];  Service: Urology;  Laterality: N/A;  . VIDEO ASSISTED THORACOSCOPY (VATS)/WEDGE RESECTION Right 03/30/2017   Procedure: VIDEO ASSISTED THORACOSCOPY (VATS)/WEDGE RESECTION;  Surgeon: Melrose Nakayama, MD;  Location: [facility];  Service: Thoracic;  Laterality: Right;     Current Outpatient Medications  Medication Sig Dispense Refill  . alfuzosin (UROXATRAL) 10 MG 24 hr tablet Take 1 tablet (10 mg total) by mouth at bedtime. 30 tablet prn  . amLODipine (NORVASC) 10 MG tablet Take 1 tablet (10 mg total) by mouth daily. 90 tablet 1  . apixaban (ELIQUIS) 5 MG TABS tablet     . atorvastatin (LIPITOR) 40 MG tablet Take 1 tablet (40 mg total) by mouth daily. 90 tablet 1  . cholecalciferol (VITAMIN D) 1000 UNITS tablet Take 1 tablet (1,000 Units total) by mouth daily. 30 tablet 6  . ferrous gluconate (FERGON) 324 MG tablet TAKE  (1)  TABLET TWICE A DAY. 180 tablet 0  . levothyroxine (SYNTHROID) 75 MCG tablet TAKE 1 TABLET ONCE DAILY 90 tablet 3  . lisinopril (ZESTRIL) 40 MG tablet Take 1 tablet (40 mg total) by mouth daily. 90 tablet 1  . metoprolol succinate (TOPROL-XL) 25 MG 24 hr tablet TAKE 1/2 TABLET DAILY 45 tablet 0  . Multiple Vitamin (MULTIVITAMIN WITH MINERALS) TABS tablet Take 1 tablet by mouth daily. One A Day Multivitamin for Men    . Omega-3 Fatty Acids (FISH OIL) 1200 MG CAPS Take 2,400 mg by mouth daily.     . pantoprazole (PROTONIX) 40 MG tablet TAKE  (1)  TABLET TWICE A DAY. 180 tablet 1   No current facility-administered medications for this visit.    Allergies:   Lovenox [enoxaparin]    ROS:  Please see the history of present illness.   Otherwise, review of systems are positive for none.   All other systems are reviewed and negative.    PHYSICAL EXAM: VS:  BP 120/70   Pulse (!) 53   Ht 5\' 10"  (1.778 m)   Wt 166 lb (75.3 kg)   BMI 23.82 kg/m  , BMI Body mass index is 23.82 kg/m. GENERAL:  Well appearing NECK:  No jugular venous distention,  waveform within normal limits, carotid upstroke brisk and symmetric, no bruits, no thyromegaly LUNGS:  Clear to auscultation bilaterally CHEST:  Well healed sternotomy scar. HEART:  PMI not displaced or sustained,S1 and S2 within normal limits, no S3,  no clicks, no rubs, brief apical and left upper sternal border systolic murmur, no diastolic murmurs, irregular ABD:  Flat, positive bowel sounds normal in frequency in pitch, no bruits, no rebound, no guarding, no midline pulsatile mass, no hepatomegaly, no splenomegaly EXT:  2 plus pulses throughout, no edema, no cyanosis no clubbing    EKG:  EKG is ordered today. The ekg ordered today demonstrates atrial fibrillation, rate 53, low voltage in the limb leads, poor anterior R wave progression, nonspecific T wave flattening.   Recent Labs: 05/28/2020: ALT 22; BUN 18; Creatinine, Ser 1.06; Hemoglobin 14.2; Platelets 180; Potassium 4.5; Sodium 142; TSH 4.470    Lipid Panel  Component Value Date/Time   CHOL 109 05/28/2020 1023   CHOL 118 01/14/2013 1157   TRIG 107 05/28/2020 1023   TRIG 160 (H) 04/13/2014 0846   TRIG 178 (H) 01/14/2013 1157   HDL 31 (L) 05/28/2020 1023   HDL 32 (L) 04/13/2014 0846   HDL 30 (L) 01/14/2013 1157   CHOLHDL 3.5 05/28/2020 1023   LDLCALC 58 05/28/2020 1023   LDLCALC 61 04/13/2014 0846   LDLCALC 52 01/14/2013 1157      Wt Readings from Last 3 Encounters:  10/31/20 166 lb (75.3 kg)  08/28/20 166 lb (75.3 kg)  08/23/20 165 lb 14.4 oz (75.3 kg)      Other studies Reviewed: Additional studies/ records that were reviewed today include: Labs. Review of the above records demonstrates:  Please see elsewhere in the note.     ASSESSMENT AND PLAN:  Nonrheumatic aortic valve stenosis:    He is status post Edwards APN 326 mm bioprosthetic TAVR.  This was stable in 2019 on echo.  No change in therapy or further imaging.  Permanent atrial fibrillation [facility]):   He tolerates this.  He does not really know that  he is in this rhythm.  He is taking his anticoagulants and having no problems with this.  He is up-to-date with blood work.  No change in therapy.  CAD in native artery: I did look at the cath report from 2018 he had nonobstructive disease and has no new symptoms.  We will continue with risk reduction.  Mixed hyperlipidemia: LDL was 58 in December last year.  No change in therapy.  PVD (peripheral vascular disease) ([place]): He is status post EVAR/he also has his carotid Dopplers followed by [facility].      Current medicines are reviewed at length with the patient today.  The patient does not have concerns regarding medicines.  The following changes have been made:  no change  Labs/ tests ordered today include: None  Orders Placed This Encounter  Procedures  . EKG 12-Lead     Disposition:   FU with 12 months.     Signed, Minus Breeding, MD  10/31/2020 3:08 PM    [facility]

## 2020-10-31 ENCOUNTER — Encounter: Payer: Self-pay | Admitting: Cardiology

## 2020-10-31 ENCOUNTER — Other Ambulatory Visit: Payer: Self-pay

## 2020-10-31 ENCOUNTER — Ambulatory Visit: Payer: Medicare Other | Admitting: Cardiology

## 2020-10-31 VITALS — BP 120/70 | HR 53 | Ht 70.0 in | Wt 166.0 lb

## 2020-10-31 DIAGNOSIS — I25118 Atherosclerotic heart disease of native coronary artery with other forms of angina pectoris: Secondary | ICD-10-CM | POA: Diagnosis not present

## 2020-10-31 DIAGNOSIS — Z952 Presence of prosthetic heart valve: Secondary | ICD-10-CM | POA: Diagnosis not present

## 2020-10-31 DIAGNOSIS — I4821 Permanent atrial fibrillation: Secondary | ICD-10-CM

## 2020-10-31 DIAGNOSIS — E785 Hyperlipidemia, unspecified: Secondary | ICD-10-CM

## 2020-10-31 DIAGNOSIS — I739 Peripheral vascular disease, unspecified: Secondary | ICD-10-CM | POA: Diagnosis not present

## 2020-10-31 NOTE — Patient Instructions (Signed)
Medication Instructions:  The current medical regimen is effective;  continue present plan and medications.  *If you need a refill on your cardiac medications before your next appointment, please call your pharmacy*  Follow-Up: At [facility], you and your health needs are our priority.  As part of our continuing mission to provide you with exceptional heart care, we have created designated Provider Care Teams.  These Care Teams include your primary Cardiologist (physician) and Advanced Practice Providers (APPs -  Physician Assistants and Nurse Practitioners) who all work together to provide you with the care you need, when you need it.  We recommend signing up for the patient portal called "MyChart".  Sign up information is provided on this After Visit Summary.  MyChart is used to connect with patients for Virtual Visits (Telemedicine).  Patients are able to view lab/test results, encounter notes, upcoming appointments, etc.  Non-urgent messages can be sent to your provider as well.   To learn more about what you can do with MyChart, go to [email or web address].    Your next appointment:   12 month(s)  The format for your next appointment:   In Person  Provider:   James Hochrein, MD   Thank you for choosing [facility]!!     

## 2020-11-24 ENCOUNTER — Other Ambulatory Visit: Payer: Self-pay | Admitting: Family Medicine

## 2020-11-26 ENCOUNTER — Ambulatory Visit: Payer: Medicare Other | Admitting: Nurse Practitioner

## 2020-11-26 NOTE — Telephone Encounter (Signed)
Prescription refill request for Eliquis received. Indication: Atrial Fib Last office visit: 10/31/20 / Hochrein Scr: 1.06 on 05/28/20 Age: 80 Weight: 75.3kg  Based on above findings Eliquis 5mg  twice daily is the appropriate dose.  Refill approved.

## 2020-11-28 ENCOUNTER — Encounter: Payer: Self-pay | Admitting: Nurse Practitioner

## 2020-12-04 ENCOUNTER — Encounter: Payer: Self-pay | Admitting: Nurse Practitioner

## 2020-12-04 ENCOUNTER — Ambulatory Visit (INDEPENDENT_AMBULATORY_CARE_PROVIDER_SITE_OTHER): Payer: Medicare Other | Admitting: Nurse Practitioner

## 2020-12-04 ENCOUNTER — Other Ambulatory Visit: Payer: Self-pay

## 2020-12-04 VITALS — BP 129/72 | HR 86 | Temp 97.8°F | Resp 20 | Ht 70.0 in | Wt 168.0 lb

## 2020-12-04 DIAGNOSIS — E559 Vitamin D deficiency, unspecified: Secondary | ICD-10-CM | POA: Diagnosis not present

## 2020-12-04 DIAGNOSIS — I4821 Permanent atrial fibrillation: Secondary | ICD-10-CM | POA: Diagnosis not present

## 2020-12-04 DIAGNOSIS — C3491 Malignant neoplasm of unspecified part of right bronchus or lung: Secondary | ICD-10-CM

## 2020-12-04 DIAGNOSIS — I6523 Occlusion and stenosis of bilateral carotid arteries: Secondary | ICD-10-CM | POA: Diagnosis not present

## 2020-12-04 DIAGNOSIS — I25118 Atherosclerotic heart disease of native coronary artery with other forms of angina pectoris: Secondary | ICD-10-CM | POA: Diagnosis not present

## 2020-12-04 DIAGNOSIS — J449 Chronic obstructive pulmonary disease, unspecified: Secondary | ICD-10-CM

## 2020-12-04 DIAGNOSIS — I10 Essential (primary) hypertension: Secondary | ICD-10-CM

## 2020-12-04 DIAGNOSIS — E034 Atrophy of thyroid (acquired): Secondary | ICD-10-CM

## 2020-12-04 DIAGNOSIS — N400 Enlarged prostate without lower urinary tract symptoms: Secondary | ICD-10-CM

## 2020-12-04 DIAGNOSIS — K219 Gastro-esophageal reflux disease without esophagitis: Secondary | ICD-10-CM | POA: Diagnosis not present

## 2020-12-04 DIAGNOSIS — I739 Peripheral vascular disease, unspecified: Secondary | ICD-10-CM | POA: Diagnosis not present

## 2020-12-04 DIAGNOSIS — E782 Mixed hyperlipidemia: Secondary | ICD-10-CM

## 2020-12-04 DIAGNOSIS — Z6826 Body mass index (BMI) 26.0-26.9, adult: Secondary | ICD-10-CM

## 2020-12-04 MED ORDER — LEVOTHYROXINE SODIUM 75 MCG PO TABS: 75.0000 ug | ORAL_TABLET | Freq: Every day | ORAL | 1 refills | Status: DC

## 2020-12-04 MED ORDER — AMLODIPINE BESYLATE 10 MG PO TABS: 10.0000 mg | ORAL_TABLET | Freq: Every day | ORAL | 1 refills | Status: DC

## 2020-12-04 MED ORDER — ATORVASTATIN CALCIUM 40 MG PO TABS: 40.0000 mg | ORAL_TABLET | Freq: Every day | ORAL | 1 refills | Status: DC

## 2020-12-04 MED ORDER — FERROUS GLUCONATE 324 (38 FE) MG PO TABS: 324.0000 mg | ORAL_TABLET | Freq: Every day | ORAL | 1 refills | Status: DC

## 2020-12-04 MED ORDER — METOPROLOL SUCCINATE ER 25 MG PO TB24: 0.5000 | ORAL_TABLET | Freq: Every day | ORAL | 1 refills | Status: DC

## 2020-12-04 MED ORDER — LISINOPRIL 40 MG PO TABS: 40.0000 mg | ORAL_TABLET | Freq: Every day | ORAL | 1 refills | Status: DC

## 2020-12-04 MED ORDER — ALFUZOSIN HCL ER 10 MG PO TB24: 10.0000 mg | ORAL_TABLET | Freq: Every day | ORAL | 99 refills | Status: DC

## 2020-12-04 MED ORDER — PANTOPRAZOLE SODIUM 40 MG PO TBEC: DELAYED_RELEASE_TABLET | ORAL | 1 refills | Status: DC

## 2020-12-04 NOTE — Patient Instructions (Signed)

## 2020-12-04 NOTE — Progress Notes (Signed)
Subjective:    Patient ID: [name] [name], male    DOB: 13-Apr-1941, 80 y.o.   MRN: [id number]   Chief Complaint: Medical Management of Chronic Issues    HPI:  1. Primary hypertension Denies chest pain, sob or headaches. He checks his blood pressure at home. RUNS [id number] systolic.  BP Readings from Last 3 Encounters:  12/04/20 129/72  10/31/20 120/70  08/28/20 (!) 152/59     2. Permanent atrial fibrillation (HCC) Denies palpitations or heart racing. He is on eliquis without any bleeding  3. Bilateral carotid artery stenosis Had carotid doppler study done 3/22 showed less tan  39% stenosis bil.sa  4. Coronary artery disease of native artery of native heart with stable angina pectoris [facility]) Saw cardiology in May 2022. Nochanges were made to plan of care during vit.  5. PVD (peripheral vascular disease) (HCC) No c/o leg swelling or cramping.  6. Chronic obstructive pulmonary disease, unspecified COPD type ([place]) Quit smoking in 2007. Is on no inhlaers. Denies cough or sob.  7. Primary lung cancer, right [facility]) He was told he is currenty clear. Sees oncology yearly.  8. Gastroesophageal reflux disease without esophagitis He is on protonix daily and says he has no symptoms.  9. Hypothyroidism due to acquired atrophy of thyroid No problems that he is aware of. Lab Results  Component Value Date   TSH 4.470 05/28/2020    10. Benign prostatic hyperplasia without lower urinary tract symptoms Is on uroxatrol daily and denies any voiding problems. Lab Results  Component Value Date   PSA1 1.5 03/16/2015   PSA 2.2 12/26/2013      11. Mixed hyperlipidemia Does try to watch diet.plays golf 1-2 times a week. Lab Results  Component Value Date   CHOL 109 05/28/2020   HDL 31 (L) 05/28/2020   LDLCALC 58 05/28/2020   TRIG 107 05/28/2020   CHOLHDL 3.5 05/28/2020     12. Vitamin D deficiency Is on daily vitamind supplement  13. BMI 26.0-26.9,adult No recent weight  changes Wt Readings from Last 3 Encounters:  12/04/20 168 lb (76.2 kg)  10/31/20 166 lb (75.3 kg)  08/28/20 166 lb (75.3 kg)   BMI Readings from Last 3 Encounters:  12/04/20 24.11 kg/m  10/31/20 23.82 kg/m  08/28/20 23.82 kg/m       Outpatient Encounter Medications as of 12/04/2020  Medication Sig   alfuzosin (UROXATRAL) 10 MG 24 hr tablet Take 1 tablet (10 mg total) by mouth at bedtime.   amLODipine (NORVASC) 10 MG tablet Take 1 tablet (10 mg total) by mouth daily.   atorvastatin (LIPITOR) 40 MG tablet Take 1 tablet (40 mg total) by mouth daily.   cholecalciferol (VITAMIN D) 1000 UNITS tablet Take 1 tablet (1,000 Units total) by mouth daily.   ELIQUIS 5 MG TABS tablet TAKE  (1)  TABLET TWICE A DAY.   ferrous gluconate (FERGON) 324 MG tablet TAKE  (1)  TABLET TWICE A DAY.   levothyroxine (SYNTHROID) 75 MCG tablet TAKE 1 TABLET ONCE DAILY   lisinopril (ZESTRIL) 40 MG tablet Take 1 tablet (40 mg total) by mouth daily.   metoprolol succinate (TOPROL-XL) 25 MG 24 hr tablet TAKE 1/2 TABLET DAILY   Multiple Vitamin (MULTIVITAMIN WITH MINERALS) TABS tablet Take 1 tablet by mouth daily. One A Day Multivitamin for Men   Omega-3 Fatty Acids (FISH OIL) 1200 MG CAPS Take 2,400 mg by mouth daily.    pantoprazole (PROTONIX) 40 MG tablet TAKE  (1)  TABLET TWICE A  DAY.   No facility-administered encounter medications on file as of 12/04/2020.    Past Surgical History:  Procedure Laterality Date   ABDOMINAL AORTIC ANEURYSM REPAIR  08-09-09   BACK SURGERY     x2   CYSTOSCOPY W/ RETROGRADES Bilateral 09/02/2018   Procedure: CYSTOSCOPY WITH RETROGRADE PYELOGRAM;  Surgeon: Franchot Gallo, MD;  Location: [facility];  Service: Urology;  Laterality: Bilateral;   CYSTOSCOPY W/ RETROGRADES Bilateral 03/20/2020   Procedure: CYSTOSCOPY WITH BILATERAL RETROGRADE PYELOGRAM;  Surgeon: Franchot Gallo, MD;  Location: [facility];  Service: Urology;  Laterality: Bilateral;   CYSTOSCOPY WITH  BIOPSY N/A 09/02/2018   Procedure: CYSTOSCOPY WITH BIOPSY;  Surgeon: Franchot Gallo, MD;  Location: [facility];  Service: Urology;  Laterality: N/A;   ESOPHAGOGASTRODUODENOSCOPY N/A 02/21/2017   Procedure: ESOPHAGOGASTRODUODENOSCOPY (EGD);  Surgeon: Jerene Bears, MD;  Location: [facility];  Service: Gastroenterology;  Laterality: N/A;   HYDROCELE EXCISION / REPAIR     LOBECTOMY Right 03/30/2017   Procedure: RIGHT UPPER LOBECTOMY LUNG;  Surgeon: Melrose Nakayama, MD;  Location: [place];  Service: Thoracic;  Laterality: Right;   SKIN CANCER EXCISION     SPINE SURGERY     TEE WITHOUT CARDIOVERSION N/A 02/24/2017   Procedure: TRANSESOPHAGEAL ECHOCARDIOGRAM (TEE);  Surgeon: Sherren Mocha, MD;  Location: [place];  Service: Open Heart Surgery;  Laterality: N/A;   TRANSCATHETER AORTIC VALVE REPLACEMENT, TRANSFEMORAL N/A 02/24/2017   Procedure: TRANSCATHETER AORTIC VALVE REPLACEMENT, TRANSFEMORAL using a 33m Edwards Sapien 3 Aortic Valve;  Surgeon: CSherren Mocha MD;  [place]  Service: Open Heart Surgery;  Laterality: N/A;   TRANSURETHRAL RESECTION OF BLADDER TUMOR N/A 06/06/2016   Procedure: TRANSURETHRAL RESECTION OF BLADDER TUMOR (TURBT);  Surgeon: SFranchot Gallo MD;  Location: [facility];  Service: Urology;  Laterality: N/A;   TRANSURETHRAL RESECTION OF BLADDER TUMOR N/A 07/22/2016   Procedure: TRANSURETHRAL RESECTION OF BLADDER TUMOR (TURBT);  Surgeon: SFranchot Gallo MD;  Location: [facility];  Service: Urology;  Laterality: N/A;   TRANSURETHRAL RESECTION OF BLADDER TUMOR N/A 03/20/2020   Procedure: TRANSURETHRAL RESECTION OF BLADDER TUMOR (TURBT);  Surgeon: DFranchot Gallo MD;  Location: [facility];  Service: Urology;  Laterality: N/A;   VIDEO ASSISTED THORACOSCOPY (VATS)/WEDGE RESECTION Right 03/30/2017   Procedure: VIDEO ASSISTED THORACOSCOPY (VATS)/WEDGE RESECTION;  Surgeon: HMelrose Nakayama MD;  [facility];  Service: Thoracic;  Laterality: Right;     Family History  Problem Relation Age of Onset   Heart disease Father        Heart Disease before age 81  Alcohol abuse Father    Heart attack Father    Cancer Mother        Lung   COPD Mother    COPD Sister    Lupus Sister    Hypertension Brother    Gout Brother    Heart attack Son    Gout Brother    Prostatitis Brother     New complaints: None today  Social history: Lives with his wife  Controlled substance contract: n/a      Review of Systems  Constitutional:  Negative for diaphoresis.  Eyes:  Negative for pain.  Respiratory:  Negative for shortness of breath.   Cardiovascular:  Negative for chest pain, palpitations and leg swelling.  Gastrointestinal:  Negative for abdominal pain.  Endocrine: Negative for polydipsia.  Skin:  Negative for rash.  Neurological:  Negative for dizziness, weakness and headaches.  Hematological:  Does not bruise/bleed easily.  All other  systems reviewed and are negative.     Objective:   Physical Exam Vitals and nursing note reviewed.  Constitutional:      Appearance: Normal appearance. He is well-developed.  HENT:     Head: Normocephalic.     Nose: Nose normal.  Eyes:     Pupils: Pupils are equal, round, and reactive to light.  Neck:     Thyroid: No thyroid mass or thyromegaly.     Vascular: No carotid bruit or JVD.     Trachea: Phonation normal.  Cardiovascular:     Rate and Rhythm: Normal rate and regular rhythm.  Pulmonary:     Effort: Pulmonary effort is normal. No respiratory distress.     Breath sounds: Normal breath sounds.  Abdominal:     General: Bowel sounds are normal.     Palpations: Abdomen is soft.     Tenderness: There is no abdominal tenderness.  Musculoskeletal:        General: Normal range of motion.     Cervical back: Normal range of motion and neck supple.  Lymphadenopathy:     Cervical: No cervical adenopathy.  Skin:    General: Skin is warm and dry.  Neurological:     Mental Status:  He is alert and oriented to person, place, and time.  Psychiatric:        Behavior: Behavior normal.        Thought Content: Thought content normal.        Judgment: Judgment normal.    BP 129/72   Pulse 86   Temp 97.8 F (36.6 C) (Temporal)   Resp 20   Ht 5' 10"  (1.778 m)   Wt 168 lb (76.2 kg)   SpO2 97%   BMI 24.11 kg/m        Assessment & Plan:   [name] [name] comes in today with chief complaint of Medical Management of Chronic Issues   Diagnosis and orders addressed:  1. Primary hypertension Low sodium diet - amLODipine (NORVASC) 10 MG tablet; Take 1 tablet (10 mg total) by mouth daily.  Dispense: 90 tablet; Refill: 1 - CBC with Differential/Platelet - CMP14+EGFR  2. Permanent atrial fibrillation (HCC) Continue eliquis  3. Bilateral carotid artery stenosis Keep follow up with cardiology  4. Coronary artery disease of native artery of native heart with stable angina pectoris (HCC) - metoprolol succinate (TOPROL-XL) 25 MG 24 hr tablet; Take 0.5 tablets (12.5 mg total) by mouth daily.  Dispense: 45 tablet; Refill: 1  5. PVD (peripheral vascular disease) ([place])  6. Chronic obstructive pulmonary disease, unspecified COPD type ([place])   7. Primary lung cancer, right ([place]) Keep yearly follo wup with oncology  8. Gastroesophageal reflux disease without esophagitis Avoid spicy foods Do not eat 2 hours prior to bedtime - pantoprazole (PROTONIX) 40 MG tablet; TAKE  (1)  TABLET TWICE A DAY.  Dispense: 180 tablet; Refill: 1  9. Hypothyroidism due to acquired atrophy of thyroid Labs pending - levothyroxine (SYNTHROID) 75 MCG tablet; Take 1 tablet (75 mcg total) by mouth daily.  Dispense: 90 tablet; Refill: 1 - Thyroid Panel With TSH  10. Benign prostatic hyperplasia without lower urinary tract symptoms Labs pending - alfuzosin (UROXATRAL) 10 MG 24 hr tablet; Take 1 tablet (10 mg total) by mouth at bedtime.  Dispense: 30 tablet; Refill: prn - PSA, total and  free  11. Mixed hyperlipidemia Low fat diet - lisinopril (ZESTRIL) 40 MG tablet; Take 1 tablet (40 mg total) by mouth daily.  Dispense:  90 tablet; Refill: 1 - atorvastatin (LIPITOR) 40 MG tablet; Take 1 tablet (40 mg total) by mouth daily.  Dispense: 90 tablet; Refill: 1 - Lipid panel  12. Vitamin D deficiency Continue daily vitamin d supplement  13. BMI 26.0-26.9,adult Discussed diet and exercise for person with BMI >25 Will recheck weight in 3-6 months   Labs pending Health Maintenance reviewed Diet and exercise encouraged  Follow up plan: 6 months   [name] Hassell Done, FNP

## 2020-12-05 LAB — LIPID PANEL
Chol/HDL Ratio: 3.7 ratio (ref 0.0–5.0)
Cholesterol, Total: 111 mg/dL (ref 100–199)
HDL: 30 mg/dL — ABNORMAL LOW (ref >39)
LDL Chol Calc (NIH): 61 mg/dL (ref 0–99)
Triglycerides: 105 mg/dL (ref 0–149)
VLDL Cholesterol Cal: 20 mg/dL (ref 5–40)

## 2020-12-05 LAB — THYROID PANEL WITH TSH
Free Thyroxine Index: 1.4 (ref 1.2–4.9)
T3 Uptake Ratio: 25 % (ref 24–39)
T4, Total: 5.6 ug/dL (ref 4.5–12.0)
TSH: 4.91 u[IU]/mL — ABNORMAL HIGH (ref 0.450–4.500)

## 2020-12-05 LAB — CMP14+EGFR
ALT: 16 IU/L (ref 0–44)
AST: 28 IU/L (ref 0–40)
Albumin/Globulin Ratio: 1.2 (ref 1.2–2.2)
Albumin: 4.4 g/dL (ref 3.6–4.6)
Alkaline Phosphatase: 75 IU/L (ref 44–121)
BUN/Creatinine Ratio: 11 (ref 10–24)
BUN: 12 mg/dL (ref 8–27)
Bilirubin Total: 0.7 mg/dL (ref 0.0–1.2)
CO2: 25 mmol/L (ref 20–29)
Calcium: 9.5 mg/dL (ref 8.6–10.2)
Chloride: 103 mmol/L (ref 96–106)
Creatinine, Ser: 1.13 mg/dL (ref 0.76–1.27)
Globulin, Total: 3.7 g/dL (ref 1.5–4.5)
Glucose: 98 mg/dL (ref 65–99)
Potassium: 4.6 mmol/L (ref 3.5–5.2)
Sodium: 141 mmol/L (ref 134–144)
Total Protein: 8.1 g/dL (ref 6.0–8.5)
eGFR: 65 mL/min/{1.73_m2} (ref >59)

## 2020-12-05 LAB — CBC WITH DIFFERENTIAL/PLATELET
Basophils Absolute: 0.1 10*3/uL (ref 0.0–0.2)
Basos: 1 %
EOS (ABSOLUTE): 0.2 10*3/uL (ref 0.0–0.4)
Eos: 3 %
Hematocrit: 38.9 % (ref 37.5–51.0)
Hemoglobin: 13.1 g/dL (ref 13.0–17.7)
Immature Grans (Abs): 0 10*3/uL (ref 0.0–0.1)
Immature Granulocytes: 0 %
Lymphocytes Absolute: 1.8 10*3/uL (ref 0.7–3.1)
Lymphs: 25 %
MCH: 31.3 pg (ref 26.6–33.0)
MCHC: 33.7 g/dL (ref 31.5–35.7)
MCV: 93 fL (ref 79–97)
Monocytes Absolute: 0.5 10*3/uL (ref 0.1–0.9)
Monocytes: 7 %
Neutrophils Absolute: 4.7 10*3/uL (ref 1.4–7.0)
Neutrophils: 64 %
Platelets: 149 10*3/uL — ABNORMAL LOW (ref 150–450)
RBC: 4.19 x10E6/uL (ref 4.14–5.80)
RDW: 12.9 % (ref 11.6–15.4)
WBC: 7.3 10*3/uL (ref 3.4–10.8)

## 2020-12-05 LAB — PSA, TOTAL AND FREE
PSA, Free Pct: 21.2 %
PSA, Free: 0.7 ng/mL
Prostate Specific Ag, Serum: 3.3 ng/mL (ref 0.0–4.0)

## 2020-12-06 ENCOUNTER — Ambulatory Visit (INDEPENDENT_AMBULATORY_CARE_PROVIDER_SITE_OTHER): Payer: Medicare Other

## 2020-12-06 VITALS — Ht 70.0 in | Wt 168.0 lb

## 2020-12-06 DIAGNOSIS — Z0000 Encounter for general adult medical examination without abnormal findings: Secondary | ICD-10-CM

## 2020-12-06 MED ORDER — LEVOTHYROXINE SODIUM 88 MCG PO TABS: 88.0000 ug | ORAL_TABLET | Freq: Every day | ORAL | 1 refills | Status: DC

## 2020-12-06 NOTE — Addendum Note (Signed)
Addended by: [name] on: 12/06/2020 01:59 PM   Modules accepted: Orders

## 2020-12-06 NOTE — Progress Notes (Signed)
Subjective:   [name] [name] is a 80 y.o. male who presents for Medicare Annual/Subsequent preventive examination.  Virtual Visit via Telephone Note  I connected with  [name] [name] on 12/06/20 at  1:15 PM EDT by telephone and verified that I am speaking with the correct person using two identifiers.  Location: Patient: Home Provider: [facility] Persons participating in the virtual visit: patient/Nurse Health Advisor   I discussed the limitations, risks, security and privacy concerns of performing an evaluation and management service by telephone and the availability of in person appointments. The patient expressed understanding and agreed to proceed.  Interactive audio and video telecommunications were attempted between this nurse and patient, however failed, due to patient having technical difficulties OR patient did not have access to video capability.  We continued and completed visit with audio only.  Some vital signs may be absent or patient reported.   [name] Jenny E Marcellous Snarski, LPN   Review of Systems     Cardiac Risk Factors include: advanced age (>84men, >24 women);dyslipidemia;hypertension;male gender     Objective:    Today's Vitals   12/06/20 1321  Weight: 168 lb (76.2 kg)  Height: 5\' 10"  (1.778 m)   Body mass index is 24.11 kg/m.  Advanced Directives 12/06/2020 03/21/2020 03/21/2020 03/20/2020 03/16/2020 06/10/2018 05/27/2017  Does Patient Have a Medical Advance Directive? Yes No Yes Yes Yes Yes Yes  Type of Paramedic of [place];Living will - Healthcare Power of Attorney Living will;Healthcare Power of Attorney Living will;Healthcare Power of Attorney Living will [place];Living will  Does patient want to make changes to medical advance directive? - No - Patient declined No - Patient declined No - Patient declined - - No - Patient declined  Copy of [place] in Chart? No - copy requested - No - copy requested  - No - copy requested - No - copy requested  Would patient like information on creating a medical advance directive? - - - - - - -    Current Medications (verified) Outpatient Encounter Medications as of 12/06/2020  Medication Sig   alfuzosin (UROXATRAL) 10 MG 24 hr tablet Take 1 tablet (10 mg total) by mouth at bedtime.   amLODipine (NORVASC) 10 MG tablet Take 1 tablet (10 mg total) by mouth daily.   atorvastatin (LIPITOR) 40 MG tablet Take 1 tablet (40 mg total) by mouth daily.   cholecalciferol (VITAMIN D) 1000 UNITS tablet Take 1 tablet (1,000 Units total) by mouth daily.   ELIQUIS 5 MG TABS tablet TAKE  (1)  TABLET TWICE A DAY.   ferrous gluconate (FERGON) 324 MG tablet Take 1 tablet (324 mg total) by mouth daily with breakfast.   levothyroxine (SYNTHROID) 75 MCG tablet Take 1 tablet (75 mcg total) by mouth daily.   lisinopril (ZESTRIL) 40 MG tablet Take 1 tablet (40 mg total) by mouth daily.   metoprolol succinate (TOPROL-XL) 25 MG 24 hr tablet Take 0.5 tablets (12.5 mg total) by mouth daily.   Multiple Vitamin (MULTIVITAMIN WITH MINERALS) TABS tablet Take 1 tablet by mouth daily. One A Day Multivitamin for Men   Omega-3 Fatty Acids (FISH OIL) 1200 MG CAPS Take 2,400 mg by mouth daily.    pantoprazole (PROTONIX) 40 MG tablet TAKE  (1)  TABLET TWICE A DAY.   No facility-administered encounter medications on file as of 12/06/2020.    Allergies (verified) Lovenox [enoxaparin]   History: Past Medical History:  Diagnosis Date   AAA (abdominal aortic  aneurysm) ([place])    Aortic stenosis, severe    a. 02/2017: s/p TAVR with Edwards Sapien 3 THV (size 26 mm, model # 9600TFX, serial # [id number])   Arthritis    Atherosclerotic peripheral vascular disease (HCC)    Atrial fibrillation, persistent (HCC)    a. not a candidate for [facility] given hx of GI bleed   Bilateral carotid artery stenosis    Bladder cancer (HCC)    COPD (chronic obstructive pulmonary disease) ([place])    Gastroesophageal reflux  disease without esophagitis 05/25/2019   Hyperlipidemia    Hypertension    Hypothyroidism    Lung cancer ([place])    Right   Lung mass    Right   Skin cancer    Vitamin D deficiency    Past Surgical History:  Procedure Laterality Date   ABDOMINAL AORTIC ANEURYSM REPAIR  08-09-09   BACK SURGERY     x2   CYSTOSCOPY W/ RETROGRADES Bilateral 09/02/2018   Procedure: CYSTOSCOPY WITH RETROGRADE PYELOGRAM;  Surgeon: Franchot Gallo, MD;  Location: [facility];  Service: Urology;  Laterality: Bilateral;   CYSTOSCOPY W/ RETROGRADES Bilateral 03/20/2020   Procedure: CYSTOSCOPY WITH BILATERAL RETROGRADE PYELOGRAM;  Surgeon: Franchot Gallo, MD;  Location: [facility];  Service: Urology;  Laterality: Bilateral;   CYSTOSCOPY WITH BIOPSY N/A 09/02/2018   Procedure: CYSTOSCOPY WITH BIOPSY;  Surgeon: Franchot Gallo, MD;  Location: [facility];  Service: Urology;  Laterality: N/A;   ESOPHAGOGASTRODUODENOSCOPY N/A 02/21/2017   Procedure: ESOPHAGOGASTRODUODENOSCOPY (EGD);  Surgeon: Jerene Bears, MD;  Location: [facility];  Service: Gastroenterology;  Laterality: N/A;   HYDROCELE EXCISION / REPAIR     LOBECTOMY Right 03/30/2017   Procedure: RIGHT UPPER LOBECTOMY LUNG;  Surgeon: Melrose Nakayama, MD;  Location: [place];  Service: Thoracic;  Laterality: Right;   SKIN CANCER EXCISION     SPINE SURGERY     TEE WITHOUT CARDIOVERSION N/A 02/24/2017   Procedure: TRANSESOPHAGEAL ECHOCARDIOGRAM (TEE);  Surgeon: Sherren Mocha, MD;  Location: [place];  Service: Open Heart Surgery;  Laterality: N/A;   TRANSCATHETER AORTIC VALVE REPLACEMENT, TRANSFEMORAL N/A 02/24/2017   Procedure: TRANSCATHETER AORTIC VALVE REPLACEMENT, TRANSFEMORAL using a 40mm Edwards Sapien 3 Aortic Valve;  Surgeon: Sherren Mocha, MD;  Location: [place];  Service: Open Heart Surgery;  Laterality: N/A;   TRANSURETHRAL RESECTION OF BLADDER TUMOR N/A 06/06/2016   Procedure: TRANSURETHRAL RESECTION OF BLADDER TUMOR (TURBT);   Surgeon: Franchot Gallo, MD;  Location: [facility];  Service: Urology;  Laterality: N/A;   TRANSURETHRAL RESECTION OF BLADDER TUMOR N/A 07/22/2016   Procedure: TRANSURETHRAL RESECTION OF BLADDER TUMOR (TURBT);  Surgeon: Franchot Gallo, MD;  Location: [facility];  Service: Urology;  Laterality: N/A;   TRANSURETHRAL RESECTION OF BLADDER TUMOR N/A 03/20/2020   Procedure: TRANSURETHRAL RESECTION OF BLADDER TUMOR (TURBT);  Surgeon: Franchot Gallo, MD;  Location: [facility];  Service: Urology;  Laterality: N/A;   VIDEO ASSISTED THORACOSCOPY (VATS)/WEDGE RESECTION Right 03/30/2017   Procedure: VIDEO ASSISTED THORACOSCOPY (VATS)/WEDGE RESECTION;  Surgeon: Melrose Nakayama, MD;  Location: [facility];  Service: Thoracic;  Laterality: Right;   Family History  Problem Relation Age of Onset   Heart disease Father        Heart Disease before age 88   Alcohol abuse Father    Heart attack Father    Cancer Mother        Lung   COPD Mother    COPD Sister    Lupus Sister    Hypertension Brother  Gout Brother    Heart attack Son    Gout Brother    Prostatitis Brother    Social History   Socioeconomic History   Marital status: Married    Spouse name: Onis Markoff   Number of children: 2   Years of education: Not on file   Highest education level: Not on file  Occupational History   Occupation: Retired    Comment: Librarian, academic with [employer]  Tobacco Use   Smoking status: Former    Packs/day: 1.00    Years: 51.00    Pack years: 51.00    Types: Cigarettes    Start date: 11/25/1954    Quit date: 10/27/2005    Years since quitting: 15.1   Smokeless tobacco: Never  Vaping Use   Vaping Use: Never used  Substance and Sexual Activity   Alcohol use: No    Alcohol/week: 0.0 standard drinks    Comment: none since 1999   Drug use: No   Sexual activity: Yes  Other Topics Concern   Not on file  Social History Narrative   2 sons that are deceased. 4 grandchildren and 3 great grandchildren. Lives  at home with his wife. He stays active, plays golf, does yard work. Goes to [employer].   Social Determinants of Health   Financial Resource Strain: Low Risk    Difficulty of Paying Living Expenses: Not hard at all  Food Insecurity: No Food Insecurity   Worried About Charity fundraiser in the Last Year: Never true   [place] in the Last Year: Never true  Transportation Needs: No Transportation Needs   Lack of Transportation (Medical): No   Lack of Transportation (Non-Medical): No  Physical Activity: Sufficiently Active   Days of Exercise per Week: 7 days   Minutes of Exercise per Session: 30 min  Stress: No Stress Concern Present   Feeling of Stress : Not at all  Social Connections: Socially Integrated   Frequency of Communication with Friends and Family: More than three times a week   Frequency of Social Gatherings with Friends and Family: More than three times a week   Attends Religious Services: More than 4 times per year   Active Member of [employer] or Organizations: Yes   Attends Music therapist: More than 4 times per year   Marital Status: Married    Tobacco Counseling Counseling given: Not Answered   Clinical Intake:  Pre-visit preparation completed: Yes  Pain : No/denies pain     BMI - recorded: 24.11 Nutritional Status: BMI of 19-24  Normal Nutritional Risks: None Diabetes: No  How often do you need to have someone help you when you read instructions, pamphlets, or other written materials from your doctor or pharmacy?: 1 - Never  Diabetic?No  Interpreter Needed?: No  Information entered by :: Kemba Hoppes, LPN   Activities of Daily Living In your present state of health, do you have any difficulty performing the following activities: 12/06/2020 03/16/2020  Hearing? N Y  Comment - decreased  Vision? N N  Difficulty concentrating or making decisions? N N  Walking or climbing stairs? N N  Dressing or bathing? N N  Doing  errands, shopping? N N  Preparing Food and eating ? N -  Using the Toilet? N -  In the past six months, have you accidently leaked urine? N -  Do you have problems with loss of bowel control? N -  Managing your Medications? N -  Managing your Finances? N -  Housekeeping or managing your Housekeeping? N -  Some recent data might be hidden    Patient Care Team: [name], FNP as PCP - General (Nurse Practitioner) [name], FNP as Nurse Practitioner (Nurse Practitioner) Allyn Kenner, MD (Dermatology) Serafina Mitchell, MD as Consulting Physician (Vascular Surgery) Sherren Mocha, MD as Consulting Physician (Cardiology) Franchot Gallo, MD as Consulting Physician (Urology) Melrose Nakayama, MD as Consulting Physician (Cardiothoracic Surgery)  Indicate any recent Medical Services you may have received from other than [facility] providers in the past year (date may be approximate).     Assessment:   This is a routine wellness examination for [place].  Hearing/Vision screen Hearing Screening - Comments:: Denies hearing difficulties.  Vision Screening - Comments:: Wears eyeglasses - up to date with annual eye exams with [facility] in [place] - has appt next month with Dr Radford Pax in [place] for cataracts.  Dietary issues and exercise activities discussed: Current Exercise Habits: Home exercise routine, Type of exercise: walking;Other - see comments (yard work, Careers information officer), Time (Minutes): 30, Frequency (Times/Week): 7, Weekly Exercise (Minutes/Week): 210, Intensity: Mild, Exercise limited by: cardiac condition(s)   Goals Addressed             This Visit's Progress    Exercise 3x per week (30 min per time)   On track      Depression Screen PHQ 2/9 Scores 12/06/2020 12/04/2020 05/28/2020 11/24/2019 05/25/2019 11/23/2018 06/10/2018  PHQ - 2 Score 0 0 0 0 0 0 2  PHQ- 9 Score 0 2 - - - - 2    Fall Risk Fall Risk  12/06/2020 12/04/2020 05/28/2020 11/24/2019 05/25/2019   Falls in the past year? 0 0 0 0 0  Number falls in past yr: 0 - - - -  Injury with Fall? 0 - - - -  Risk for fall due to : Impaired vision - - - -  Follow up Falls prevention discussed - - - -    FALL RISK PREVENTION PERTAINING TO THE HOME:  Any stairs in or around the home? Yes  If so, are there any without handrails? No  Home free of loose throw rugs in walkways, pet beds, electrical cords, etc? Yes  Adequate lighting in your home to reduce risk of falls? Yes   ASSISTIVE DEVICES UTILIZED TO PREVENT FALLS:  Life alert? No  Use of a cane, walker or w/c? Yes  Grab bars in the bathroom? Yes  Shower chair or bench in shower? Yes  Elevated toilet seat or a handicapped toilet? Yes   TIMED UP AND GO:  Was the test performed? No . Telephonic visit.  Cognitive Function:  MMSE - Mini Mental State Exam 06/10/2018 05/27/2017 05/26/2016 12/05/2014  Orientation to time 5 5 5 5   Orientation to Place 5 5 5 5   Registration 3 3 3 3   Attention/ Calculation 5 5 5 5   Recall 3 2 3 2   Language- name 2 objects 2 2 2 2   Language- repeat 1 1 1 1   Language- follow 3 step command 3 3 3 3   Language- read & follow direction 1 1 1 1   Write a sentence 1 1 1 1   Copy design 1 1 1 1   Total score 30 29 30 29      6CIT Screen 12/06/2020  What Year? 0 points  What month? 0 points  What time? 0 points  Count back from 20 0 points  Months in reverse 0 points  Repeat phrase  0 points  Total Score 0    Immunizations Immunization History  Administered Date(s) Administered   Fluad Quad(high Dose 65+) 03/08/2019, 03/08/2019   Influenza Inj Mdck Quad With Preservative 03/18/2018   Influenza Split 03/23/2013   Influenza, High Dose Seasonal PF 03/06/2014   Influenza,inj,Quad PF,6+ Mos 03/22/2015, 03/18/2017   Influenza-Unspecified 03/22/2016, 03/18/2018, 03/02/2019, 03/27/2020   Moderna Sars-Covid-2 Vaccination 08/02/2019, 08/30/2019, 04/24/2020   Pneumococcal Conjugate-13 04/26/2013   Pneumococcal  Polysaccharide-23 06/23/2004, 12/11/2014   Td 03/24/2007   Zoster Recombinat (Shingrix) 07/04/2020, 09/14/2020    TDAP status: Due, Education has been provided regarding the importance of this vaccine. Advised may receive this vaccine at local pharmacy or Health Dept. Aware to provide a copy of the vaccination record if obtained from local pharmacy or Health Dept. Verbalized acceptance and understanding.  Flu Vaccine status: Up to date  Pneumococcal vaccine status: Up to date  Covid-19 vaccine status: Completed vaccines  Qualifies for Shingles Vaccine? Yes   Zostavax completed Yes   Shingrix Completed?: Yes  Screening Tests Health Maintenance  Topic Date Due   COVID-19 Vaccine (4 - Booster for Moderna series) 12/20/2020 (Originally 07/25/2020)   TETANUS/TDAP  03/06/2021 (Originally 03/23/2017)   INFLUENZA VACCINE  01/21/2021   PNA vac Low Risk Adult  Completed   Zoster Vaccines- Shingrix  Completed   HPV VACCINES  Aged Out    Health Maintenance  There are no preventive care reminders to display for this patient.  Colorectal cancer screening: No longer required.   Lung Cancer Screening: (Low Dose CT Chest recommended if Age 41-80 years, 30 pack-year currently smoking OR have quit w/in 15years.) does not qualify.   Additional Screening:  Hepatitis C Screening: does not qualify  Vision Screening: Recommended annual ophthalmology exams for early detection of glaucoma and other disorders of the eye. Is the patient up to date with their annual eye exam?  Yes  Who is the provider or what is the name of the office in which the patient attends annual eye exams? [facility] If pt is not established with a provider, would they like to be referred to a provider to establish care? No .   Dental Screening: Recommended annual dental exams for proper oral hygiene  Community Resource Referral / Chronic Care Management: CRR required this visit?  No   CCM required this visit?   No      Plan:     I have personally reviewed and noted the following in the patient's chart:   Medical and social history Use of alcohol, tobacco or illicit drugs  Current medications and supplements including opioid prescriptions. Patient is not currently taking opioid prescriptions. Functional ability and status Nutritional status Physical activity Advanced directives List of other physicians Hospitalizations, surgeries, and ER visits in previous 12 months Vitals Screenings to include cognitive, depression, and falls Referrals and appointments  In addition, I have reviewed and discussed with patient certain preventive protocols, quality metrics, and best practice recommendations. A written personalized care plan for preventive services as well as general preventive health recommendations were provided to patient.     Sandrea Hammond, LPN   6/96/2952   Nurse Notes: None

## 2020-12-06 NOTE — Patient Instructions (Signed)
[name] , Thank you for taking time to come for your Medicare Wellness Visit. I appreciate your ongoing commitment to your health goals. Please review the following plan we discussed and let me know if I can assist you in the future.   Screening recommendations/referrals: Colonoscopy: Done 02/24/2013 - No repeat required Recommended yearly ophthalmology/optometry visit for glaucoma screening and checkup Recommended yearly dental visit for hygiene and checkup  Vaccinations: Influenza vaccine: Done 03/27/2020 - Repeat annually Pneumococcal vaccine: Done 04/26/2013 & 12/11/2014 Tdap vaccine: Done 03/24/2007 - Repeat in 10 years - due now Shingles vaccine: Done 07/04/20 & 09/14/20   Covid-19: Done 08/02/19, 08/30/19, & 04/24/20  Advanced directives: Please bring a copy of your health care power of attorney and living will to the office to be added to your chart at your convenience.  Conditions/risks identified: Aim for 30 minutes of exercise or brisk walking each day, drink 6-8 glasses of water and eat lots of fruits and vegetables.  Next appointment: Follow up in one year for your annual wellness visit.   Preventive Care 81 Years and Older, Male  Preventive care refers to lifestyle choices and visits with your health care provider that can promote health and wellness. What does preventive care include? A yearly physical exam. This is also called an annual well check. Dental exams once or twice a year. Routine eye exams. Ask your health care provider how often you should have your eyes checked. Personal lifestyle choices, including: Daily care of your teeth and gums. Regular physical activity. Eating a healthy diet. Avoiding tobacco and drug use. Limiting alcohol use. Practicing safe sex. Taking low doses of aspirin every day. Taking vitamin and mineral supplements as recommended by your health care provider. What happens during an annual well check? The services and screenings done by your  health care provider during your annual well check will depend on your age, overall health, lifestyle risk factors, and family history of disease. Counseling  Your health care provider may ask you questions about your: Alcohol use. Tobacco use. Drug use. Emotional well-being. Home and relationship well-being. Sexual activity. Eating habits. History of falls. Memory and ability to understand (cognition). Work and work Statistician. Screening  You may have the following tests or measurements: Height, weight, and BMI. Blood pressure. Lipid and cholesterol levels. These may be checked every 5 years, or more frequently if you are over 60 years old. Skin check. Lung cancer screening. You may have this screening every year starting at age 81 if you have a 30-pack-year history of smoking and currently smoke or have quit within the past 15 years. Fecal occult blood test (FOBT) of the stool. You may have this test every year starting at age 81. Flexible sigmoidoscopy or colonoscopy. You may have a sigmoidoscopy every 5 years or a colonoscopy every 10 years starting at age 81. Prostate cancer screening. Recommendations will vary depending on your family history and other risks. Hepatitis C blood test. Hepatitis B blood test. Sexually transmitted disease (STD) testing. Diabetes screening. This is done by checking your blood sugar (glucose) after you have not eaten for a while (fasting). You may have this done every 1-3 years. Abdominal aortic aneurysm (AAA) screening. You may need this if you are a current or former smoker. Osteoporosis. You may be screened starting at age 81 if you are at high risk. Talk with your health care provider about your test results, treatment options, and if necessary, the need for more tests. Vaccines  Your health  care provider may recommend certain vaccines, such as: Influenza vaccine. This is recommended every year. Tetanus, diphtheria, and acellular pertussis  (Tdap, Td) vaccine. You may need a Td booster every 10 years. Zoster vaccine. You may need this after age 59. Pneumococcal 13-valent conjugate (PCV13) vaccine. One dose is recommended after age 11. Pneumococcal polysaccharide (PPSV23) vaccine. One dose is recommended after age 27. Talk to your health care provider about which screenings and vaccines you need and how often you need them. This information is not intended to replace advice given to you by your health care provider. Make sure you discuss any questions you have with your health care provider. Document Released: 07/06/2015 Document Revised: 02/27/2016 Document Reviewed: 04/10/2015 Elsevier Interactive Patient Education  2017 [place] Prevention in the Home Falls can cause injuries. They can happen to people of all ages. There are many things you can do to make your home safe and to help prevent falls. What can I do on the outside of my home? Regularly fix the edges of walkways and driveways and fix any cracks. Remove anything that might make you trip as you walk through a door, such as a raised step or threshold. Trim any bushes or trees on the path to your home. Use bright outdoor lighting. Clear any walking paths of anything that might make someone trip, such as rocks or tools. Regularly check to see if handrails are loose or broken. Make sure that both sides of any steps have handrails. Any raised decks and porches should have guardrails on the edges. Have any leaves, snow, or ice cleared regularly. Use sand or salt on walking paths during winter. Clean up any spills in your garage right away. This includes oil or grease spills. What can I do in the bathroom? Use night lights. Install grab bars by the toilet and in the tub and shower. Do not use towel bars as grab bars. Use non-skid mats or decals in the tub or shower. If you need to sit down in the shower, use a plastic, non-slip stool. Keep the floor dry. Clean  up any water that spills on the floor as soon as it happens. Remove soap buildup in the tub or shower regularly. Attach bath mats securely with double-sided non-slip rug tape. Do not have throw rugs and other things on the floor that can make you trip. What can I do in the bedroom? Use night lights. Make sure that you have a light by your bed that is easy to reach. Do not use any sheets or blankets that are too big for your bed. They should not hang down onto the floor. Have a firm chair that has side arms. You can use this for support while you get dressed. Do not have throw rugs and other things on the floor that can make you trip. What can I do in the kitchen? Clean up any spills right away. Avoid walking on wet floors. Keep items that you use a lot in easy-to-reach places. If you need to reach something above you, use a strong step stool that has a grab bar. Keep electrical cords out of the way. Do not use floor polish or wax that makes floors slippery. If you must use wax, use non-skid floor wax. Do not have throw rugs and other things on the floor that can make you trip. What can I do with my stairs? Do not leave any items on the stairs. Make sure that there are handrails on  both sides of the stairs and use them. Fix handrails that are broken or loose. Make sure that handrails are as long as the stairways. Check any carpeting to make sure that it is firmly attached to the stairs. Fix any carpet that is loose or worn. Avoid having throw rugs at the top or bottom of the stairs. If you do have throw rugs, attach them to the floor with carpet tape. Make sure that you have a light switch at the top of the stairs and the bottom of the stairs. If you do not have them, ask someone to add them for you. What else can I do to help prevent falls? Wear shoes that: Do not have high heels. Have rubber bottoms. Are comfortable and fit you well. Are closed at the toe. Do not wear sandals. If you  use a stepladder: Make sure that it is fully opened. Do not climb a closed stepladder. Make sure that both sides of the stepladder are locked into place. Ask someone to hold it for you, if possible. Clearly mark and make sure that you can see: Any grab bars or handrails. First and last steps. Where the edge of each step is. Use tools that help you move around (mobility aids) if they are needed. These include: Canes. Walkers. Scooters. Crutches. Turn on the lights when you go into a dark area. Replace any light bulbs as soon as they burn out. Set up your furniture so you have a clear path. Avoid moving your furniture around. If any of your floors are uneven, fix them. If there are any pets around you, be aware of where they are. Review your medicines with your doctor. Some medicines can make you feel dizzy. This can increase your chance of falling. Ask your doctor what other things that you can do to help prevent falls. This information is not intended to replace advice given to you by your health care provider. Make sure you discuss any questions you have with your health care provider. Document Released: 04/05/2009 Document Revised: 11/15/2015 Document Reviewed: 07/14/2014 Elsevier Interactive Patient Education  2017 [employer].

## 2020-12-12 ENCOUNTER — Other Ambulatory Visit: Payer: Self-pay | Admitting: Nurse Practitioner

## 2020-12-12 DIAGNOSIS — I10 Essential (primary) hypertension: Secondary | ICD-10-CM

## 2020-12-12 DIAGNOSIS — K219 Gastro-esophageal reflux disease without esophagitis: Secondary | ICD-10-CM

## 2020-12-12 DIAGNOSIS — I25118 Atherosclerotic heart disease of native coronary artery with other forms of angina pectoris: Secondary | ICD-10-CM

## 2020-12-12 DIAGNOSIS — E782 Mixed hyperlipidemia: Secondary | ICD-10-CM

## 2020-12-31 ENCOUNTER — Telehealth: Payer: Self-pay | Admitting: Nurse Practitioner

## 2020-12-31 DIAGNOSIS — E034 Atrophy of thyroid (acquired): Secondary | ICD-10-CM

## 2020-12-31 NOTE — Telephone Encounter (Signed)
Patient aware he is to return in 8 weeks for labs only to recheck thyroid.  Order placed.

## 2021-01-16 DIAGNOSIS — H524 Presbyopia: Secondary | ICD-10-CM | POA: Diagnosis not present

## 2021-01-16 DIAGNOSIS — H53001 Unspecified amblyopia, right eye: Secondary | ICD-10-CM | POA: Diagnosis not present

## 2021-01-16 DIAGNOSIS — H2513 Age-related nuclear cataract, bilateral: Secondary | ICD-10-CM | POA: Diagnosis not present

## 2021-01-16 DIAGNOSIS — H25041 Posterior subcapsular polar age-related cataract, right eye: Secondary | ICD-10-CM | POA: Diagnosis not present

## 2021-01-31 DIAGNOSIS — H2511 Age-related nuclear cataract, right eye: Secondary | ICD-10-CM | POA: Diagnosis not present

## 2021-01-31 DIAGNOSIS — H25011 Cortical age-related cataract, right eye: Secondary | ICD-10-CM | POA: Diagnosis not present

## 2021-01-31 DIAGNOSIS — H25041 Posterior subcapsular polar age-related cataract, right eye: Secondary | ICD-10-CM | POA: Diagnosis not present

## 2021-01-31 DIAGNOSIS — H25811 Combined forms of age-related cataract, right eye: Secondary | ICD-10-CM | POA: Diagnosis not present

## 2021-02-13 ENCOUNTER — Other Ambulatory Visit: Payer: Medicare Other

## 2021-02-13 ENCOUNTER — Other Ambulatory Visit: Payer: Self-pay

## 2021-02-13 DIAGNOSIS — E034 Atrophy of thyroid (acquired): Secondary | ICD-10-CM

## 2021-02-14 LAB — THYROID PANEL WITH TSH
Free Thyroxine Index: 1.5 (ref 1.2–4.9)
T3 Uptake Ratio: 26 % (ref 24–39)
T4, Total: 5.8 ug/dL (ref 4.5–12.0)
TSH: 1.97 u[IU]/mL (ref 0.450–4.500)

## 2021-02-25 NOTE — Progress Notes (Signed)
History of Present Illness:   He underwent emergent TURBT on 12.15.2017 for a large bladder cancer burden at his right bladder neck, posterior dome and left lateral wall. He did well after the procedure. He apparently been bleeding for 3 months. He is a cigarette smoker.    Pathology revealed high-grade, NMIBC. Muscle was present in the specimen.    He underwent repeat resection on 1.30.2018. 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    He was initiated on BCG and competed his 6 induction treatments on 3.27.2018.    5.15.2018 -he was found to have a small papillary recurrence on his anterior bladder wall--he underwent cysto/cauterization of this on 5.17.2018.  7.31.2018. 1st 3 maintenance BCG's completed  9.18.2018: Office cysto negative  10.24.2018: completed 3 maintenance BCG Rx's  1.22.2019: Surveillance cystoscopy.  2.19.2019: Completed 3 maintenance BCG treatments  4.2.2019: He has had no blood or burning with urination since his BCG has been completed. He tolerated the BCG well.  2.25.2020--Cysto--small BT recurrence.    3.12.2020: TURBT/bilateral RGPs. Cytologies of renal washings nml. Bladder tumor LG NMIBC.    6.2.2020: Cysto negative.    10.6.2020: Cystoscopy negative   3.2021: Cystoscopy negative, cytology with atypia   11.09.2021: Repeat TURBT/gemcitabine 9.28.2021. Path--benign (inflammation).  3.8.2022: Cysto negative, cytology--NEGATIVE FOR HIGH-GRADE UROTHELIAL CARCINOMA ([place]).  REACTIVE UROTHELIAL CELLS ARE PRESENT.  CELLULAR DEGENERATION IS PRESENT.  ACUTE INFLAMMATION AND BACTERIA ARE PRESENT.   9.6.2022: Here for routine 80-month visit.  No gross hematuria, no dysuria.  Still on alfuzosin.  Past Medical History:  Diagnosis Date   AAA (abdominal aortic aneurysm) ([place])    Aortic stenosis, severe    a. 02/2017: s/p TAVR with Edwards Sapien 3 THV (size 26 mm, model # 9600TFX, serial # [id number])   Arthritis     Atherosclerotic peripheral vascular disease (HCC)    Atrial fibrillation, persistent (HCC)    a. not a candidate for [facility] given hx of GI bleed   Bilateral carotid artery stenosis    Bladder cancer (HCC)    COPD (chronic obstructive pulmonary disease) ([place])    Gastroesophageal reflux disease without esophagitis 05/25/2019   Hyperlipidemia    Hypertension    Hypothyroidism    Lung cancer ([place])    Right   Lung mass    Right   Skin cancer    Vitamin D deficiency     Past Surgical History:  Procedure Laterality Date   ABDOMINAL AORTIC ANEURYSM REPAIR  08-09-09   BACK SURGERY     x2   CYSTOSCOPY W/ RETROGRADES Bilateral 09/02/2018   Procedure: CYSTOSCOPY WITH RETROGRADE PYELOGRAM;  Surgeon: Franchot Gallo, MD;  Location: [facility];  Service: Urology;  Laterality: Bilateral;   CYSTOSCOPY W/ RETROGRADES Bilateral 03/20/2020   Procedure: CYSTOSCOPY WITH BILATERAL RETROGRADE PYELOGRAM;  Surgeon: Franchot Gallo, MD;  Location: [facility];  Service: Urology;  Laterality: Bilateral;   CYSTOSCOPY WITH BIOPSY N/A 09/02/2018   Procedure: CYSTOSCOPY WITH BIOPSY;  Surgeon: Franchot Gallo, MD;  Location: [facility];  Service: Urology;  Laterality: N/A;   ESOPHAGOGASTRODUODENOSCOPY N/A 02/21/2017   Procedure: ESOPHAGOGASTRODUODENOSCOPY (EGD);  Surgeon: Jerene Bears, MD;  Location: [facility];  Service: Gastroenterology;  Laterality: N/A;   HYDROCELE EXCISION / REPAIR     LOBECTOMY Right 03/30/2017   Procedure: RIGHT UPPER LOBECTOMY LUNG;  Surgeon: Melrose Nakayama, MD;  Location: [place];  Service: Thoracic;  Laterality: Right;   SKIN CANCER EXCISION     SPINE SURGERY  TEE WITHOUT CARDIOVERSION N/A 02/24/2017   Procedure: TRANSESOPHAGEAL ECHOCARDIOGRAM (TEE);  Surgeon: Sherren Mocha, MD;  Location: [place];  Service: Open Heart Surgery;  Laterality: N/A;   TRANSCATHETER AORTIC VALVE REPLACEMENT, TRANSFEMORAL N/A 02/24/2017   Procedure: TRANSCATHETER AORTIC  VALVE REPLACEMENT, TRANSFEMORAL using a 29mm Edwards Sapien 3 Aortic Valve;  Surgeon: Sherren Mocha, MD;  Location: [place];  Service: Open Heart Surgery;  Laterality: N/A;   TRANSURETHRAL RESECTION OF BLADDER TUMOR N/A 06/06/2016   Procedure: TRANSURETHRAL RESECTION OF BLADDER TUMOR (TURBT);  Surgeon: Franchot Gallo, MD;  Location: [facility];  Service: Urology;  Laterality: N/A;   TRANSURETHRAL RESECTION OF BLADDER TUMOR N/A 07/22/2016   Procedure: TRANSURETHRAL RESECTION OF BLADDER TUMOR (TURBT);  Surgeon: Franchot Gallo, MD;  Location: [facility];  Service: Urology;  Laterality: N/A;   TRANSURETHRAL RESECTION OF BLADDER TUMOR N/A 03/20/2020   Procedure: TRANSURETHRAL RESECTION OF BLADDER TUMOR (TURBT);  Surgeon: Franchot Gallo, MD;  Location: [facility];  Service: Urology;  Laterality: N/A;   VIDEO ASSISTED THORACOSCOPY (VATS)/WEDGE RESECTION Right 03/30/2017   Procedure: VIDEO ASSISTED THORACOSCOPY (VATS)/WEDGE RESECTION;  Surgeon: Melrose Nakayama, MD;  Location: [place];  Service: Thoracic;  Laterality: Right;    Home Medications:  Allergies as of 02/26/2021       Reactions   Lovenox [enoxaparin]    HIT panel ordered 10/10        Medication List        Accurate as of February 25, 2021  9:00 PM. If you have any questions, ask your nurse or doctor.          alfuzosin 10 MG 24 hr tablet Commonly known as: UROXATRAL Take 1 tablet (10 mg total) by mouth at bedtime.   amLODipine 10 MG tablet Commonly known as: NORVASC Take 1 tablet (10 mg total) by mouth daily.   atorvastatin 40 MG tablet Commonly known as: LIPITOR Take 1 tablet (40 mg total) by mouth daily.   cholecalciferol 1000 units tablet Commonly known as: VITAMIN D Take 1 tablet (1,000 Units total) by mouth daily.   Eliquis 5 MG Tabs tablet Generic drug: apixaban TAKE  (1)  TABLET TWICE A DAY.   ferrous gluconate 324 MG tablet Commonly known as: FERGON Take 1 tablet (324 mg total) by mouth daily with  breakfast.   Fish Oil 1200 MG Caps Take 2,400 mg by mouth daily.   levothyroxine 88 MCG tablet Commonly known as: SYNTHROID Take 1 tablet (88 mcg total) by mouth daily.   lisinopril 40 MG tablet Commonly known as: ZESTRIL Take 1 tablet (40 mg total) by mouth daily.   metoprolol succinate 25 MG 24 hr tablet Commonly known as: TOPROL-XL Take 0.5 tablets (12.5 mg total) by mouth daily.   multivitamin with minerals Tabs tablet Take 1 tablet by mouth daily. One A Day Multivitamin for Men   pantoprazole 40 MG tablet Commonly known as: PROTONIX TAKE  (1)  TABLET TWICE A DAY.        Allergies:  Allergies  Allergen Reactions   Lovenox [Enoxaparin]     HIT panel ordered 10/10    Family History  Problem Relation Age of Onset   Heart disease Father        Heart Disease before age 61   Alcohol abuse Father    Heart attack Father    Cancer Mother        Lung   COPD Mother    COPD Sister    Lupus Sister    Hypertension Brother  Gout Brother    Heart attack Son    Gout Brother    Prostatitis Brother     Social History:  reports that he quit smoking about 15 years ago. His smoking use included cigarettes. He started smoking about 66 years ago. He has a 51.00 pack-year smoking history. He has never used smokeless tobacco. He reports that he does not drink alcohol and does not use drugs.  ROS: A complete review of systems was performed.  All systems are negative except for pertinent findings as noted.  Physical Exam:  Vital signs in last 24 hours: There were no vitals taken for this visit. Constitutional:  Alert and oriented, No acute distress Cardiovascular: Regular rate  Respiratory: Normal respiratory effort GI: Abdomen is soft, nontender, nondistended, no abdominal masses. No CVAT.  Genitourinary: Normal male phallus, testes are descended bilaterally and non-tender and without masses, scrotum is normal in appearance without lesions or masses, perineum is normal on  inspection. Lymphatic: No lymphadenopathy Neurologic: Grossly intact, no focal deficits Psychiatric: Normal mood and affect  I have reviewed prior pt notes  I have reviewed notes from referring/previous physicians  I have reviewed urinalysis results  Cystoscopy Procedure Note:  Indication: Follow-up of bladder cancer  After informed consent and discussion of the procedure and its risks, COMMODORE BELLEW was positioned and prepped in the standard fashion.  Cystoscopy was performed with a flexible cystoscope.   Findings: Urethra: No stricture or urothelial lesions Prostate: Minimal obstruction Bladder neck: Open Ureteral orifices: Normal bilaterally Bladder: No urothelial lesions.  No stones.  2+ trabeculations.  The patient tolerated the procedure well.       Impression/Assessment:  History of bladder cancer, no evidence of recurrence based on cystoscopy today  Plan:  I will see back in 6 months for cystoscopy  Voided urine sent for cytology

## 2021-02-26 ENCOUNTER — Encounter: Payer: Self-pay | Admitting: Urology

## 2021-02-26 ENCOUNTER — Other Ambulatory Visit: Payer: Self-pay

## 2021-02-26 ENCOUNTER — Ambulatory Visit: Payer: Medicare Other | Admitting: Urology

## 2021-02-26 VITALS — BP 122/81 | HR 74

## 2021-02-26 DIAGNOSIS — N400 Enlarged prostate without lower urinary tract symptoms: Secondary | ICD-10-CM | POA: Diagnosis not present

## 2021-02-26 DIAGNOSIS — Z8551 Personal history of malignant neoplasm of bladder: Secondary | ICD-10-CM | POA: Diagnosis not present

## 2021-02-26 DIAGNOSIS — Z7689 Persons encountering health services in other specified circumstances: Secondary | ICD-10-CM | POA: Diagnosis not present

## 2021-02-26 LAB — MICROSCOPIC EXAMINATION
Bacteria, UA: NONE SEEN
Epithelial Cells (non renal): NONE SEEN /hpf (ref 0–10)
Renal Epithel, UA: NONE SEEN /hpf

## 2021-02-26 LAB — URINALYSIS, ROUTINE W REFLEX MICROSCOPIC
Bilirubin, UA: NEGATIVE
Glucose, UA: NEGATIVE
Ketones, UA: NEGATIVE
Nitrite, UA: NEGATIVE
Protein,UA: NEGATIVE
Specific Gravity, UA: 1.02 (ref 1.005–1.030)
Urobilinogen, Ur: 1 mg/dL (ref 0.2–1.0)
pH, UA: 6.5 (ref 5.0–7.5)

## 2021-02-26 MED ORDER — CIPROFLOXACIN HCL 500 MG PO TABS
500.0000 mg | ORAL_TABLET | Freq: Once | ORAL | Status: AC
  Administered 2021-02-26: 500 mg via ORAL

## 2021-02-26 NOTE — Progress Notes (Signed)
Urological Symptom Review  Patient is experiencing the following symptoms: Weak stream   Review of Systems  Gastrointestinal (upper)  : Negative for upper GI symptoms  Gastrointestinal (lower) : Negative for lower GI symptoms  Constitutional : Negative for symptoms  Skin: Negative for skin symptoms  Eyes: Negative for eye symptoms  Ear/Nose/Throat : Negative for Ear/Nose/Throat symptoms  Hematologic/Lymphatic: Negative for Hematologic/Lymphatic symptoms  Cardiovascular : Negative for cardiovascular symptoms  Respiratory : Negative for respiratory symptoms  Endocrine: Negative for endocrine symptoms  Musculoskeletal: Negative for musculoskeletal symptoms  Neurological: Negative for neurological symptoms  Psychologic: Negative for psychiatric symptoms

## 2021-02-27 LAB — CYTOLOGY, URINE

## 2021-03-01 NOTE — Progress Notes (Signed)
Results printed and mailed.   

## 2021-03-14 DIAGNOSIS — H25012 Cortical age-related cataract, left eye: Secondary | ICD-10-CM | POA: Diagnosis not present

## 2021-03-14 DIAGNOSIS — H25812 Combined forms of age-related cataract, left eye: Secondary | ICD-10-CM | POA: Diagnosis not present

## 2021-03-14 DIAGNOSIS — H2512 Age-related nuclear cataract, left eye: Secondary | ICD-10-CM | POA: Diagnosis not present

## 2021-03-18 ENCOUNTER — Other Ambulatory Visit: Payer: Self-pay | Admitting: Urology

## 2021-03-18 DIAGNOSIS — N3000 Acute cystitis without hematuria: Secondary | ICD-10-CM

## 2021-03-20 ENCOUNTER — Ambulatory Visit (HOSPITAL_COMMUNITY)
Admission: RE | Admit: 2021-03-20 | Discharge: 2021-03-20 | Disposition: A | Discharge status: Home / Self Care | Payer: Medicare Other | Source: Ambulatory Visit | Attending: Internal Medicine | Admitting: Internal Medicine

## 2021-03-20 ENCOUNTER — Other Ambulatory Visit: Payer: Self-pay

## 2021-03-20 ENCOUNTER — Encounter (HOSPITAL_COMMUNITY): Payer: Self-pay

## 2021-03-20 ENCOUNTER — Inpatient Hospital Stay: Payer: Medicare Other | Attending: Internal Medicine

## 2021-03-20 DIAGNOSIS — C3490 Malignant neoplasm of unspecified part of unspecified bronchus or lung: Secondary | ICD-10-CM | POA: Diagnosis not present

## 2021-03-20 DIAGNOSIS — J439 Emphysema, unspecified: Secondary | ICD-10-CM | POA: Diagnosis not present

## 2021-03-20 DIAGNOSIS — Z902 Acquired absence of lung [part of]: Secondary | ICD-10-CM | POA: Diagnosis not present

## 2021-03-20 DIAGNOSIS — I700 Atherosclerosis of aorta: Secondary | ICD-10-CM | POA: Diagnosis not present

## 2021-03-20 DIAGNOSIS — Z85118 Personal history of other malignant neoplasm of bronchus and lung: Secondary | ICD-10-CM | POA: Insufficient documentation

## 2021-03-20 DIAGNOSIS — J90 Pleural effusion, not elsewhere classified: Secondary | ICD-10-CM | POA: Diagnosis not present

## 2021-03-20 LAB — CMP (CANCER CENTER ONLY)
ALT: 15 U/L (ref 0–44)
AST: 26 U/L (ref 15–41)
Albumin: 4.1 g/dL (ref 3.5–5.0)
Alkaline Phosphatase: 79 U/L (ref 38–126)
Anion gap: 11 (ref 5–15)
BUN: 17 mg/dL (ref 8–23)
CO2: 22 mmol/L (ref 22–32)
Calcium: 9.7 mg/dL (ref 8.9–10.3)
Chloride: 108 mmol/L (ref 98–111)
Creatinine: 1.14 mg/dL (ref 0.61–1.24)
GFR, Estimated: >60 mL/min (ref >60)
Glucose, Bld: 108 mg/dL — ABNORMAL HIGH (ref 70–99)
Potassium: 4 mmol/L (ref 3.5–5.1)
Sodium: 141 mmol/L (ref 135–145)
Total Bilirubin: 0.7 mg/dL (ref 0.3–1.2)
Total Protein: 8.2 g/dL — ABNORMAL HIGH (ref 6.5–8.1)

## 2021-03-20 LAB — CBC WITH DIFFERENTIAL (CANCER CENTER ONLY)
Abs Immature Granulocytes: 0.02 10*3/uL (ref 0.00–0.07)
Basophils Absolute: 0 10*3/uL (ref 0.0–0.1)
Basophils Relative: 1 %
Eosinophils Absolute: 0.3 10*3/uL (ref 0.0–0.5)
Eosinophils Relative: 5 %
HCT: 36.1 % — ABNORMAL LOW (ref 39.0–52.0)
Hemoglobin: 12.4 g/dL — ABNORMAL LOW (ref 13.0–17.0)
Immature Granulocytes: 0 %
Lymphocytes Relative: 22 %
Lymphs Abs: 1.3 10*3/uL (ref 0.7–4.0)
MCH: 32 pg (ref 26.0–34.0)
MCHC: 34.3 g/dL (ref 30.0–36.0)
MCV: 93 fL (ref 80.0–100.0)
Monocytes Absolute: 0.6 10*3/uL (ref 0.1–1.0)
Monocytes Relative: 10 %
Neutro Abs: 3.8 10*3/uL (ref 1.7–7.7)
Neutrophils Relative %: 62 %
Platelet Count: 168 10*3/uL (ref 150–400)
RBC: 3.88 MIL/uL — ABNORMAL LOW (ref 4.22–5.81)
RDW: 13.3 % (ref 11.5–15.5)
WBC Count: 6 10*3/uL (ref 4.0–10.5)
nRBC: 0 % (ref 0.0–0.2)

## 2021-03-20 MED ORDER — IOHEXOL 350 MG/ML SOLN
60.0000 mL | Freq: Once | INTRAVENOUS | Status: AC | PRN
  Administered 2021-03-20: 60 mL via INTRAVENOUS

## 2021-03-25 ENCOUNTER — Other Ambulatory Visit: Payer: Self-pay

## 2021-03-25 MED ORDER — FERROUS GLUCONATE 324 (38 FE) MG PO TABS: 324.0000 mg | ORAL_TABLET | Freq: Two times a day (BID) | ORAL | 1 refills | Status: DC

## 2021-03-27 NOTE — Telephone Encounter (Signed)
Called patient- did no need per pt. Refill came from pharmacy.

## 2021-04-15 DIAGNOSIS — Z961 Presence of intraocular lens: Secondary | ICD-10-CM | POA: Diagnosis not present

## 2021-04-16 ENCOUNTER — Other Ambulatory Visit: Payer: Medicare Other

## 2021-04-18 ENCOUNTER — Inpatient Hospital Stay: Payer: Medicare Other | Attending: Internal Medicine | Admitting: Internal Medicine

## 2021-04-18 ENCOUNTER — Other Ambulatory Visit: Payer: Medicare Other

## 2021-04-18 ENCOUNTER — Encounter: Payer: Self-pay | Admitting: Internal Medicine

## 2021-04-18 ENCOUNTER — Other Ambulatory Visit: Payer: Self-pay

## 2021-04-18 VITALS — BP 131/65 | HR 75 | Temp 97.0°F | Resp 20 | Ht 70.0 in | Wt 165.5 lb

## 2021-04-18 DIAGNOSIS — C3490 Malignant neoplasm of unspecified part of unspecified bronchus or lung: Secondary | ICD-10-CM | POA: Diagnosis not present

## 2021-04-18 DIAGNOSIS — Z902 Acquired absence of lung [part of]: Secondary | ICD-10-CM | POA: Insufficient documentation

## 2021-04-18 DIAGNOSIS — Z85118 Personal history of other malignant neoplasm of bronchus and lung: Secondary | ICD-10-CM | POA: Insufficient documentation

## 2021-04-18 NOTE — Progress Notes (Signed)
[place] [phone]   [phone]  OFFICE PROGRESS NOTE  [name], [place] [address]  DIAGNOSIS: Stage IA (T1a, N0, M0) non-small cell lung cancer, squamous cell carcinoma presented with right upper lobe lung nodule   PRIOR THERAPY: status post right upper lobectomy with lymph node dissection on March 30, 2017 under the care of Dr. Roxan Hockey.  CURRENT THERAPY: Observation.  INTERVAL HISTORY: [name] [name] 80 y.o. male returns to the clinic today for annual follow-up visit.  The patient is feeling fine today with no concerning complaints.  He denied having any chest pain, shortness of breath, cough or hemoptysis.  He denied having any fever or chills.  He has no nausea, vomiting, diarrhea or constipation.  He has no headache or visual changes.  He had repeat CT scan of the chest performed recently and is here for evaluation and discussion of his discuss results.  MEDICAL HISTORY: Past Medical History:  Diagnosis Date   AAA (abdominal aortic aneurysm)    Aortic stenosis, severe    a. 02/2017: s/p TAVR with Edwards Sapien 3 THV (size 26 mm, model # 9600TFX, serial # [id number])   Arthritis    Atherosclerotic peripheral vascular disease (HCC)    Atrial fibrillation, persistent (HCC)    a. not a candidate for [place] given hx of GI bleed   Bilateral carotid artery stenosis    Bladder cancer (HCC)    COPD (chronic obstructive pulmonary disease) ([place])    Gastroesophageal reflux disease without esophagitis 05/25/2019   Hyperlipidemia    Hypertension    Hypothyroidism    Lung cancer ([place])    Right   Lung mass    Right   Skin cancer    Vitamin D deficiency     ALLERGIES:  is allergic to lovenox [enoxaparin].  MEDICATIONS:  Current Outpatient Medications  Medication Sig Dispense Refill   alfuzosin (UROXATRAL) 10 MG 24 hr tablet Take 1 tablet (10 mg total) by mouth at bedtime. 30 tablet prn    amLODipine (NORVASC) 10 MG tablet Take 1 tablet (10 mg total) by mouth daily. 90 tablet 1   atorvastatin (LIPITOR) 40 MG tablet Take 1 tablet (40 mg total) by mouth daily. 90 tablet 1   cholecalciferol (VITAMIN D) 1000 UNITS tablet Take 1 tablet (1,000 Units total) by mouth daily. 30 tablet 6   ELIQUIS 5 MG TABS tablet TAKE  (1)  TABLET TWICE A DAY. 60 tablet 6   ferrous gluconate (FERGON) 324 MG tablet Take 1 tablet (324 mg total) by mouth 2 (two) times daily with a meal. 180 tablet 1   levothyroxine (SYNTHROID) 88 MCG tablet Take 1 tablet (88 mcg total) by mouth daily. 90 tablet 1   lisinopril (ZESTRIL) 40 MG tablet Take 1 tablet (40 mg total) by mouth daily. 90 tablet 1   metoprolol succinate (TOPROL-XL) 25 MG 24 hr tablet Take 0.5 tablets (12.5 mg total) by mouth daily. 45 tablet 1   moxifloxacin (VIGAMOX) 0.5 % ophthalmic solution Place 1 drop into the right eye 4 (four) times daily.     Multiple Vitamin (MULTIVITAMIN WITH MINERALS) TABS tablet Take 1 tablet by mouth daily. One A Day Multivitamin for Men     Omega-3 Fatty Acids (FISH OIL) 1200 MG CAPS Take 2,400 mg by mouth daily.      pantoprazole (PROTONIX) 40 MG tablet TAKE  (1)  TABLET TWICE A DAY. 180 tablet 1   prednisoLONE  acetate (PRED FORTE) 1 % ophthalmic suspension Place 1 drop into the right eye 4 (four) times daily.     PROLENSA 0.07 % SOLN Place 1 drop into the right eye daily.     No current facility-administered medications for this visit.    SURGICAL HISTORY:  Past Surgical History:  Procedure Laterality Date   ABDOMINAL AORTIC ANEURYSM REPAIR  08-09-09   BACK SURGERY     x2   CYSTOSCOPY W/ RETROGRADES Bilateral 09/02/2018   Procedure: CYSTOSCOPY WITH RETROGRADE PYELOGRAM;  Surgeon: Franchot Gallo, MD;  Location: [facility];  Service: Urology;  Laterality: Bilateral;   CYSTOSCOPY W/ RETROGRADES Bilateral 03/20/2020   Procedure: CYSTOSCOPY WITH BILATERAL RETROGRADE PYELOGRAM;  Surgeon: Franchot Gallo, MD;  [facility];  Service: Urology;  Laterality: Bilateral;   CYSTOSCOPY WITH BIOPSY N/A 09/02/2018   Procedure: CYSTOSCOPY WITH BIOPSY;  Surgeon: Franchot Gallo, MD;  Location[facility];  Service: Urology;  Laterality: N/A;   ESOPHAGOGASTRODUODENOSCOPY N/A 02/21/2017   Procedure: ESOPHAGOGASTRODUODENOSCOPY (EGD);  Surgeon: Jerene Bears, MD;  Location[facility];  Service: Gastroenterology;  Laterality: N/A;   HYDROCELE EXCISION / REPAIR     LOBECTOMY Right 03/30/2017   Procedure: RIGHT UPPER LOBECTOMY LUNG;  Surgeon: Melrose Nakayama, MD;  Location: [facility];  Service: Thoracic;  Laterality: Right;   SKIN CANCER EXCISION     SPINE SURGERY     TEE WITHOUT CARDIOVERSION N/A 02/24/2017   Procedure: TRANSESOPHAGEAL ECHOCARDIOGRAM (TEE);  Surgeon: Sherren Mocha, MD;  Location: [place];  Service: Open Heart Surgery;  Laterality: N/A;   TRANSCATHETER AORTIC VALVE REPLACEMENT, TRANSFEMORAL N/A 02/24/2017   Procedure: TRANSCATHETER AORTIC VALVE REPLACEMENT, TRANSFEMORAL using a 30mm Edwards Sapien 3 Aortic Valve;  Surgeon: Sherren Mocha, MD;  Location: [place];  Service: Open Heart Surgery;  Laterality: N/A;   TRANSURETHRAL RESECTION OF BLADDER TUMOR N/A 06/06/2016   Procedure: TRANSURETHRAL RESECTION OF BLADDER TUMOR (TURBT);  Surgeon: Franchot Gallo, MD;  Location: [facility];  Service: Urology;  Laterality: N/A;   TRANSURETHRAL RESECTION OF BLADDER TUMOR N/A 07/22/2016   Procedure: TRANSURETHRAL RESECTION OF BLADDER TUMOR (TURBT);  Surgeon: Franchot Gallo, MD;  Location: [facility];  Service: Urology;  Laterality: N/A;   TRANSURETHRAL RESECTION OF BLADDER TUMOR N/A 03/20/2020   Procedure: TRANSURETHRAL RESECTION OF BLADDER TUMOR (TURBT);  Surgeon: Franchot Gallo, MD;  [facility];  Service: Urology;  Laterality: N/A;   VIDEO ASSISTED THORACOSCOPY (VATS)/WEDGE RESECTION Right 03/30/2017   Procedure: VIDEO ASSISTED THORACOSCOPY (VATS)/WEDGE RESECTION;  Surgeon:  Melrose Nakayama, MD;  Location: [facility];  Service: Thoracic;  Laterality: Right;    REVIEW OF SYSTEMS:  A comprehensive review of systems was negative.   PHYSICAL EXAMINATION: General appearance: alert, cooperative, and no distress Head: Normocephalic, without obvious abnormality, atraumatic Neck: no adenopathy, no JVD, supple, symmetrical, trachea midline, and thyroid not enlarged, symmetric, no tenderness/mass/nodules Lymph nodes: Cervical, supraclavicular, and axillary nodes normal. Resp: clear to auscultation bilaterally Back: symmetric, no curvature. ROM normal. No CVA tenderness. Cardio: regular rate and rhythm, S1, S2 normal, no murmur, click, rub or gallop GI: soft, non-tender; bowel sounds normal; no masses,  no organomegaly Extremities: extremities normal, atraumatic, no cyanosis or edema  ECOG PERFORMANCE STATUS: 1 - Symptomatic but completely ambulatory  Blood pressure 131/65, pulse 75, temperature (!) 97 F (36.1 C), temperature source Tympanic, resp. rate 20, height 5\' 10"  (1.778 m), weight 165 lb 8 oz (75.1 kg), SpO2 96 %.  LABORATORY DATA: Lab Results  Component Value  Date   WBC 6.0 03/20/2021   HGB 12.4 (L) 03/20/2021   HCT 36.1 (L) 03/20/2021   MCV 93.0 03/20/2021   PLT 168 03/20/2021      Chemistry      Component Value Date/Time   NA 141 03/20/2021 0932   NA 141 12/04/2020 1423   NA 140 05/07/2017 1419   K 4.0 03/20/2021 0932   K 4.2 05/07/2017 1419   CL 108 03/20/2021 0932   CO2 22 03/20/2021 0932   CO2 25 05/07/2017 1419   BUN 17 03/20/2021 0932   BUN 12 12/04/2020 1423   BUN 14.5 05/07/2017 1419   CREATININE 1.14 03/20/2021 0932   CREATININE 1.0 05/07/2017 1419      Component Value Date/Time   CALCIUM 9.7 03/20/2021 0932   CALCIUM 9.6 05/07/2017 1419   ALKPHOS 79 03/20/2021 0932   ALKPHOS 85 05/07/2017 1419   AST 26 03/20/2021 0932   AST 23 05/07/2017 1419   ALT 15 03/20/2021 0932   ALT 14 05/07/2017 1419   BILITOT 0.7 03/20/2021 0932    BILITOT 0.48 05/07/2017 1419       RADIOGRAPHIC STUDIES: CT Chest W Contrast  Result Date: 03/21/2021 CLINICAL DATA:  Non-small cell lung cancer staging EXAM: CT CHEST WITH CONTRAST TECHNIQUE: Multidetector CT imaging of the chest was performed during intravenous contrast administration. CONTRAST:  96mL OMNIPAQUE IOHEXOL 350 MG/ML SOLN COMPARISON:  03/19/2020 FINDINGS: Cardiovascular: Aortic atherosclerosis. Aortic valve stent endograft. Normal heart size. Three-vessel coronary artery calcification. No pericardial effusion. Mediastinum/Nodes: No enlarged mediastinal, hilar, or axillary lymph nodes. Thyroid gland, trachea, and esophagus demonstrate no significant findings. 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 Status post right upper lobectomy. Diffuse bilateral bronchial wall thickening. Chronic trace right pleural effusion associated pleural thickening. Upper Abdomen: No acute abnormality. Small gallstone in the dependent gallbladder. Musculoskeletal: No chest wall mass or suspicious bone lesions identified. IMPRESSION: 1. Status post right upper lobectomy. No evidence of recurrent or metastatic disease in the chest. 2. Emphysema and diffuse bilateral bronchial wall thickening. 3. Coronary artery disease. 4. Aortic valve stent endograft. 5. Cholelithiasis. Aortic Atherosclerosis (ICD10-I70.0) and Emphysema (ICD10-J43.9). Electronically Signed   By: Delanna Ahmadi M.D.   On: 03/21/2021 10:35     ASSESSMENT AND PLAN: This is a very pleasant 80 years old white male with a stage IA non-small cell lung cancer status post right upper lobectomy with lymph node dissection in October 2018. The patient has been on observation since that time and he is feeling fine today with no concerning complaints. He had repeat CT scan of the chest performed recently.  I personally and independently reviewed the scan and discussed the results with the patient and his wife. His scan showed no concerning  findings for disease recurrence or metastasis. I recommended for him to continue on observation with repeat CT scan of the chest in 1 year. He was advised to call immediately if he has any concerning symptoms in the interval. The patient voices understanding of current disease status and treatment options and is in agreement with the current care plan. All questions were answered. The patient knows to call the clinic with any problems, questions or concerns. We can certainly see the patient much sooner if necessary.    Disclaimer: This note was dictated with voice recognition software. Similar sounding words can inadvertently be transcribed and may not be corrected upon review.

## 2021-04-18 NOTE — Addendum Note (Signed)
Addended by: Ardeen Garland on: 04/18/2021 01:01 PM   Modules accepted: Orders

## 2021-04-19 ENCOUNTER — Telehealth: Payer: Self-pay | Admitting: Internal Medicine

## 2021-04-19 NOTE — Telephone Encounter (Signed)
Left message with follow-up appointments per 10/27 los.

## 2021-06-06 ENCOUNTER — Encounter: Payer: Self-pay | Admitting: Nurse Practitioner

## 2021-06-06 ENCOUNTER — Ambulatory Visit (INDEPENDENT_AMBULATORY_CARE_PROVIDER_SITE_OTHER): Payer: Medicare Other | Admitting: Nurse Practitioner

## 2021-06-06 VITALS — BP 133/69 | HR 64 | Temp 98.1°F | Resp 20 | Ht 70.0 in | Wt 168.0 lb

## 2021-06-06 DIAGNOSIS — Z6824 Body mass index (BMI) 24.0-24.9, adult: Secondary | ICD-10-CM

## 2021-06-06 DIAGNOSIS — I4821 Permanent atrial fibrillation: Secondary | ICD-10-CM | POA: Diagnosis not present

## 2021-06-06 DIAGNOSIS — I10 Essential (primary) hypertension: Secondary | ICD-10-CM

## 2021-06-06 DIAGNOSIS — J449 Chronic obstructive pulmonary disease, unspecified: Secondary | ICD-10-CM

## 2021-06-06 DIAGNOSIS — N400 Enlarged prostate without lower urinary tract symptoms: Secondary | ICD-10-CM

## 2021-06-06 DIAGNOSIS — E782 Mixed hyperlipidemia: Secondary | ICD-10-CM | POA: Diagnosis not present

## 2021-06-06 DIAGNOSIS — I25118 Atherosclerotic heart disease of native coronary artery with other forms of angina pectoris: Secondary | ICD-10-CM

## 2021-06-06 DIAGNOSIS — E559 Vitamin D deficiency, unspecified: Secondary | ICD-10-CM | POA: Diagnosis not present

## 2021-06-06 DIAGNOSIS — K219 Gastro-esophageal reflux disease without esophagitis: Secondary | ICD-10-CM | POA: Diagnosis not present

## 2021-06-06 DIAGNOSIS — E034 Atrophy of thyroid (acquired): Secondary | ICD-10-CM

## 2021-06-06 DIAGNOSIS — I6523 Occlusion and stenosis of bilateral carotid arteries: Secondary | ICD-10-CM | POA: Diagnosis not present

## 2021-06-06 MED ORDER — PANTOPRAZOLE SODIUM 40 MG PO TBEC: DELAYED_RELEASE_TABLET | ORAL | 1 refills | Status: DC

## 2021-06-06 MED ORDER — ALFUZOSIN HCL ER 10 MG PO TB24: 10.0000 mg | ORAL_TABLET | Freq: Every day | ORAL | 1 refills | Status: DC

## 2021-06-06 MED ORDER — METOPROLOL SUCCINATE ER 25 MG PO TB24: 12.5000 mg | ORAL_TABLET | Freq: Every day | ORAL | 1 refills | Status: DC

## 2021-06-06 MED ORDER — APIXABAN 5 MG PO TABS: 5.0000 mg | ORAL_TABLET | Freq: Two times a day (BID) | ORAL | 6 refills | Status: DC

## 2021-06-06 MED ORDER — FERROUS GLUCONATE 324 (38 FE) MG PO TABS: 324.0000 mg | ORAL_TABLET | Freq: Two times a day (BID) | ORAL | 1 refills | Status: DC

## 2021-06-06 MED ORDER — AMLODIPINE BESYLATE 10 MG PO TABS: 10.0000 mg | ORAL_TABLET | Freq: Every day | ORAL | 1 refills | Status: DC

## 2021-06-06 MED ORDER — LEVOTHYROXINE SODIUM 88 MCG PO TABS: 88.0000 ug | ORAL_TABLET | Freq: Every day | ORAL | 1 refills | Status: DC

## 2021-06-06 MED ORDER — LISINOPRIL 40 MG PO TABS: 40.0000 mg | ORAL_TABLET | Freq: Every day | ORAL | 1 refills | Status: DC

## 2021-06-06 MED ORDER — ATORVASTATIN CALCIUM 40 MG PO TABS: 40.0000 mg | ORAL_TABLET | Freq: Every day | ORAL | 1 refills | Status: DC

## 2021-06-06 NOTE — Patient Instructions (Signed)

## 2021-06-06 NOTE — Progress Notes (Signed)
Subjective:    Patient ID: [name] [name], male    DOB: Jun 05, 1941, 80 y.o.   MRN: [id number]  Chief Complaint: Medical Management of Chronic Issues    HPI:  1. Primary hypertension No c/o chest pain , sob or headache. DOes no check blood pressure at home. BP Readings from Last 3 Encounters:  06/06/21 133/69  04/18/21 131/65  02/26/21 122/81     2. Coronary artery disease of native artery of native heart with stable angina pectoris ([place]) 3. Bilateral carotid artery stenosis Last carotid doppler study was done on 08/23/20 and both side were less than 40% occluded.  4. Permanent atrial fibrillation ([place]) Saw cardiology on 10/31/20. He was doing well at that time. Review of office note stated no changes to current plan of care. Is on eliquis with no bleeding issues.  5. Chronic obstructive pulmonary disease, unspecified COPD type ([place]) Is not o n any inhalers. Denies any sob or cough  6. Gastroesophageal reflux disease without esophagitis Is on protonix daily and is having  no reflux stmptoms  7. Hypothyroidism due to acquired atrophy of thyroid No problems that he is aware of. Lab Results  Component Value Date   TSH 1.970 02/13/2021     8. Mixed hyperlipidemia Does watch diet and walks at [employer] daily. Plays golf several times a week. Lab Results  Component Value Date   CHOL 111 12/04/2020   HDL 30 (L) 12/04/2020   LDLCALC 61 12/04/2020   TRIG 105 12/04/2020   CHOLHDL 3.7 12/04/2020    9. Benign prostatic hyperplasia without lower urinary tract symptoms No problems voiding. Is on uroxatrol daily  10. Vitamin D deficiency Is on daily vitamin d supplement  11. BMI 23.0-23.9,adult Weight is up 3 lbs Wt Readings from Last 3 Encounters:  06/06/21 168 lb (76.2 kg)  04/18/21 165 lb 8 oz (75.1 kg)  12/06/20 168 lb (76.2 kg)   BMI Readings from Last 3 Encounters:  06/06/21 24.11 kg/m  04/18/21 23.75 kg/m  12/06/20 24.11 kg/m       Outpatient  Encounter Medications as of 06/06/2021  Medication Sig   alfuzosin (UROXATRAL) 10 MG 24 hr tablet Take 1 tablet (10 mg total) by mouth at bedtime.   amLODipine (NORVASC) 10 MG tablet Take 1 tablet (10 mg total) by mouth daily.   atorvastatin (LIPITOR) 40 MG tablet Take 1 tablet (40 mg total) by mouth daily.   cholecalciferol (VITAMIN D) 1000 UNITS tablet Take 1 tablet (1,000 Units total) by mouth daily.   ELIQUIS 5 MG TABS tablet TAKE  (1)  TABLET TWICE A DAY.   ferrous gluconate (FERGON) 324 MG tablet Take 1 tablet (324 mg total) by mouth 2 (two) times daily with a meal.   levothyroxine (SYNTHROID) 88 MCG tablet Take 1 tablet (88 mcg total) by mouth daily.   lisinopril (ZESTRIL) 40 MG tablet Take 1 tablet (40 mg total) by mouth daily.   metoprolol succinate (TOPROL-XL) 25 MG 24 hr tablet Take 0.5 tablets (12.5 mg total) by mouth daily.   Multiple Vitamin (MULTIVITAMIN WITH MINERALS) TABS tablet Take 1 tablet by mouth daily. One A Day Multivitamin for Men   Omega-3 Fatty Acids (FISH OIL) 1200 MG CAPS Take 2,400 mg by mouth daily.    pantoprazole (PROTONIX) 40 MG tablet TAKE  (1)  TABLET TWICE A DAY.   No facility-administered encounter medications on file as of 06/06/2021.    Past Surgical History:  Procedure Laterality Date   ABDOMINAL AORTIC ANEURYSM  REPAIR  08/09/2009   BACK SURGERY     x2   CATARACT EXTRACTION BILATERAL W/ ANTERIOR VITRECTOMY Bilateral 2022   CYSTOSCOPY W/ RETROGRADES Bilateral 09/02/2018   Procedure: CYSTOSCOPY WITH RETROGRADE PYELOGRAM;  Surgeon: Franchot Gallo, MD;  Location: [facility];  Service: Urology;  Laterality: Bilateral;   CYSTOSCOPY W/ RETROGRADES Bilateral 03/20/2020   Procedure: CYSTOSCOPY WITH BILATERAL RETROGRADE PYELOGRAM;  Surgeon: Franchot Gallo, MD;  [facility];  Service: Urology;  Laterality: Bilateral;   CYSTOSCOPY WITH BIOPSY N/A 09/02/2018   Procedure: CYSTOSCOPY WITH BIOPSY;  Surgeon: Franchot Gallo, MD;   Location: [facility];  Service: Urology;  Laterality: N/A;   ESOPHAGOGASTRODUODENOSCOPY N/A 02/21/2017   Procedure: ESOPHAGOGASTRODUODENOSCOPY (EGD);  Surgeon: Jerene Bears, MD;  Location: [facility];  Service: Gastroenterology;  Laterality: N/A;   HYDROCELE EXCISION / REPAIR     LOBECTOMY Right 03/30/2017   Procedure: RIGHT UPPER LOBECTOMY LUNG;  Surgeon: Melrose Nakayama, MD;  Location: [place];  Service: Thoracic;  Laterality: Right;   SKIN CANCER EXCISION     SPINE SURGERY     TEE WITHOUT CARDIOVERSION N/A 02/24/2017   Procedure: TRANSESOPHAGEAL ECHOCARDIOGRAM (TEE);  Surgeon: Sherren Mocha, MD;  Location: [place];  Service: Open Heart Surgery;  Laterality: N/A;   TRANSCATHETER AORTIC VALVE REPLACEMENT, TRANSFEMORAL N/A 02/24/2017   Procedure: TRANSCATHETER AORTIC VALVE REPLACEMENT, TRANSFEMORAL using a 64m Edwards Sapien 3 Aortic Valve;  Surgeon: CSherren Mocha MD;  [place]  Service: Open Heart Surgery;  Laterality: N/A;   TRANSURETHRAL RESECTION OF BLADDER TUMOR N/A 06/06/2016   Procedure: TRANSURETHRAL RESECTION OF BLADDER TUMOR (TURBT);  Surgeon: SFranchot Gallo MD;  [facility];  Service: Urology;  Laterality: N/A;   TRANSURETHRAL RESECTION OF BLADDER TUMOR N/A 07/22/2016   Procedure: TRANSURETHRAL RESECTION OF BLADDER TUMOR (TURBT);  Surgeon: SFranchot Gallo MD;  [facility];  Service: Urology;  Laterality: N/A;   TRANSURETHRAL RESECTION OF BLADDER TUMOR N/A 03/20/2020   Procedure: TRANSURETHRAL RESECTION OF BLADDER TUMOR (TURBT);  Surgeon: DFranchot Gallo MD;  [facility];  Service: Urology;  Laterality: N/A;   VIDEO ASSISTED THORACOSCOPY (VATS)/WEDGE RESECTION Right 03/30/2017   Procedure: VIDEO ASSISTED THORACOSCOPY (VATS)/WEDGE RESECTION;  Surgeon: HMelrose Nakayama MD;  [facility];  Service: Thoracic;  Laterality: Right;    Family History  Problem Relation Age of Onset   Heart disease Father        Heart  Disease before age 81  Alcohol abuse Father    Heart attack Father    Cancer Mother        Lung   COPD Mother    COPD Sister    Lupus Sister    Hypertension Brother    Gout Brother    Heart attack Son    Gout Brother    Prostatitis Brother     New complaints: None today  Social history: Lives with his wife  Controlled substance contract: n/a     Review of Systems  Constitutional:  Negative for diaphoresis.  Eyes:  Negative for pain.  Respiratory:  Negative for shortness of breath.   Cardiovascular:  Negative for chest pain, palpitations and leg swelling.  Gastrointestinal:  Negative for abdominal pain.  Endocrine: Negative for polydipsia.  Skin:  Negative for rash.  Neurological:  Negative for dizziness, weakness and headaches.  Hematological:  Does not bruise/bleed easily.  All other systems reviewed and are negative.     Objective:   Physical Exam Vitals and nursing note  reviewed.  Constitutional:      Appearance: Normal appearance. He is well-developed.  HENT:     Head: Normocephalic.     Nose: Nose normal.     Mouth/Throat:     Mouth: Mucous membranes are moist.     Pharynx: Oropharynx is clear.  Eyes:     Pupils: Pupils are equal, round, and reactive to light.  Neck:     Thyroid: No thyroid mass or thyromegaly.     Vascular: No carotid bruit or JVD.     Trachea: Phonation normal.  Cardiovascular:     Rate and Rhythm: Normal rate and regular rhythm.  Pulmonary:     Effort: Pulmonary effort is normal. No respiratory distress.     Breath sounds: Normal breath sounds.  Abdominal:     General: Bowel sounds are normal.     Palpations: Abdomen is soft.     Tenderness: There is no abdominal tenderness.  Musculoskeletal:        General: Normal range of motion.     Cervical back: Normal range of motion and neck supple.  Lymphadenopathy:     Cervical: No cervical adenopathy.  Skin:    General: Skin is warm and dry.  Neurological:     Mental Status:  He is alert and oriented to person, place, and time.  Psychiatric:        Behavior: Behavior normal.        Thought Content: Thought content normal.        Judgment: Judgment normal.   BP 133/69    Pulse 64    Temp 98.1 F (36.7 C) (Temporal)    Resp 20    Ht 5' 10"  (1.778 m)    Wt 168 lb (76.2 kg)    SpO2 96%    BMI 24.11 kg/m         Assessment & Plan:  JOSIMAR CORNING comes in today with chief complaint of Medical Management of Chronic Issues   Diagnosis and orders addressed:  1. Primary hypertension Low sodium diet - amLODipine (NORVASC) 10 MG tablet; Take 1 tablet (10 mg total) by mouth daily.  Dispense: 90 tablet; Refill: 1 - CBC with Differential/Platelet - CMP14+EGFR  2. Coronary artery disease of native artery of native heart with stable angina pectoris ([place]) Keep follow up with cardiology - metoprolol succinate (TOPROL-XL) 25 MG 24 hr tablet; Take 0.5 tablets (12.5 mg total) by mouth daily.  Dispense: 45 tablet; Refill: 1  3. Bilateral carotid artery stenosis Will recheck next year  4. Permanent atrial fibrillation (HCC) Report an bleeding issues - apixaban (ELIQUIS) 5 MG TABS tablet; Take 1 tablet (5 mg total) by mouth 2 (two) times daily.  Dispense: 60 tablet; Refill: 6  5. Chronic obstructive pulmonary disease, unspecified COPD type ([place]) Report any sob  6. Gastroesophageal reflux disease without esophagitis Avoid spicy foods Do not eat 2 hours prior to bedtime - pantoprazole (PROTONIX) 40 MG tablet; TAKE  (1)  TABLET TWICE A DAY.  Dispense: 180 tablet; Refill: 1  7. Hypothyroidism due to acquired atrophy of thyroid Labs pending - levothyroxine (SYNTHROID) 88 MCG tablet; Take 1 tablet (88 mcg total) by mouth daily.  Dispense: 90 tablet; Refill: 1 - Thyroid Panel With TSH  8. Mixed hyperlipidemia Low fat diet - lisinopril (ZESTRIL) 40 MG tablet; Take 1 tablet (40 mg total) by mouth daily.  Dispense: 90 tablet; Refill: 1 - atorvastatin (LIPITOR) 40  MG tablet; Take 1 tablet (40 mg total) by mouth  daily.  Dispense: 90 tablet; Refill: 1 - Lipid panel  9. Benign prostatic hyperplasia without lower urinary tract symptoms - alfuzosin (UROXATRAL) 10 MG 24 hr tablet; Take 1 tablet (10 mg total) by mouth at bedtime.  Dispense: 90 tablet; Refill: 1  10. Vitamin D deficiency Continue daily vitamin d supplement  11. BMI 24.0-24.9, adult Discussed diet and exercise for person with BMI >25 Will recheck weight in 3-6 months    Labs pending Health Maintenance reviewed Diet and exercise encouraged  Follow up plan: 6 months   [place], FNP

## 2021-06-07 LAB — CMP14+EGFR
ALT: 15 IU/L (ref 0–44)
AST: 28 IU/L (ref 0–40)
Albumin/Globulin Ratio: 1.4 (ref 1.2–2.2)
Albumin: 4.8 g/dL — ABNORMAL HIGH (ref 3.6–4.6)
Alkaline Phosphatase: 72 IU/L (ref 44–121)
BUN/Creatinine Ratio: 16 (ref 10–24)
BUN: 19 mg/dL (ref 8–27)
Bilirubin Total: 0.7 mg/dL (ref 0.0–1.2)
CO2: 23 mmol/L (ref 20–29)
Calcium: 9.5 mg/dL (ref 8.6–10.2)
Chloride: 101 mmol/L (ref 96–106)
Creatinine, Ser: 1.18 mg/dL (ref 0.76–1.27)
Globulin, Total: 3.4 g/dL (ref 1.5–4.5)
Glucose: 103 mg/dL — ABNORMAL HIGH (ref 70–99)
Potassium: 4.3 mmol/L (ref 3.5–5.2)
Sodium: 140 mmol/L (ref 134–144)
Total Protein: 8.2 g/dL (ref 6.0–8.5)
eGFR: 62 mL/min/{1.73_m2} (ref >59)

## 2021-06-07 LAB — CBC WITH DIFFERENTIAL/PLATELET
Basophils Absolute: 0 10*3/uL (ref 0.0–0.2)
Basos: 1 %
EOS (ABSOLUTE): 0.2 10*3/uL (ref 0.0–0.4)
Eos: 3 %
Hematocrit: 39.8 % (ref 37.5–51.0)
Hemoglobin: 13.5 g/dL (ref 13.0–17.7)
Immature Grans (Abs): 0 10*3/uL (ref 0.0–0.1)
Immature Granulocytes: 0 %
Lymphocytes Absolute: 1.7 10*3/uL (ref 0.7–3.1)
Lymphs: 26 %
MCH: 31.7 pg (ref 26.6–33.0)
MCHC: 33.9 g/dL (ref 31.5–35.7)
MCV: 93 fL (ref 79–97)
Monocytes Absolute: 0.6 10*3/uL (ref 0.1–0.9)
Monocytes: 8 %
Neutrophils Absolute: 4.1 10*3/uL (ref 1.4–7.0)
Neutrophils: 62 %
Platelets: 151 10*3/uL (ref 150–450)
RBC: 4.26 x10E6/uL (ref 4.14–5.80)
RDW: 12.6 % (ref 11.6–15.4)
WBC: 6.6 10*3/uL (ref 3.4–10.8)

## 2021-06-07 LAB — LIPID PANEL
Chol/HDL Ratio: 4.1 ratio (ref 0.0–5.0)
Cholesterol, Total: 118 mg/dL (ref 100–199)
HDL: 29 mg/dL — ABNORMAL LOW (ref >39)
LDL Chol Calc (NIH): 65 mg/dL (ref 0–99)
Triglycerides: 131 mg/dL (ref 0–149)
VLDL Cholesterol Cal: 24 mg/dL (ref 5–40)

## 2021-06-07 LAB — THYROID PANEL WITH TSH
Free Thyroxine Index: 1.9 (ref 1.2–4.9)
T3 Uptake Ratio: 28 % (ref 24–39)
T4, Total: 6.7 ug/dL (ref 4.5–12.0)
TSH: 2 u[IU]/mL (ref 0.450–4.500)

## 2021-06-17 ENCOUNTER — Other Ambulatory Visit: Payer: Self-pay | Admitting: Nurse Practitioner

## 2021-06-17 DIAGNOSIS — E782 Mixed hyperlipidemia: Secondary | ICD-10-CM

## 2021-06-17 DIAGNOSIS — E034 Atrophy of thyroid (acquired): Secondary | ICD-10-CM

## 2021-08-19 ENCOUNTER — Other Ambulatory Visit: Payer: Self-pay

## 2021-08-19 DIAGNOSIS — Z95828 Presence of other vascular implants and grafts: Secondary | ICD-10-CM

## 2021-08-26 ENCOUNTER — Ambulatory Visit: Payer: Medicare Other | Admitting: Physician Assistant

## 2021-08-26 ENCOUNTER — Other Ambulatory Visit: Payer: Self-pay

## 2021-08-26 ENCOUNTER — Ambulatory Visit (HOSPITAL_COMMUNITY)
Admission: RE | Admit: 2021-08-26 | Discharge: 2021-08-26 | Disposition: A | Discharge status: Home / Self Care | Payer: Medicare Other | Source: Ambulatory Visit | Attending: Surgery | Admitting: Surgery

## 2021-08-26 VITALS — BP 139/79 | HR 57 | Temp 97.7°F | Resp 16 | Ht 69.5 in | Wt 164.0 lb

## 2021-08-26 DIAGNOSIS — Z95828 Presence of other vascular implants and grafts: Secondary | ICD-10-CM | POA: Insufficient documentation

## 2021-08-26 NOTE — Progress Notes (Signed)
[facility] [place] HISTORY AND PHYSICAL   History of Present Illness:  Patient is a 81 y.o. year old male who presents for evaluation of abdominal aortic aneurysm.  He has a history of abdominal aortic aneurysm status post EVAR 2011. He is also followed for carotid stenosis with no history of symptoms for stroke or TIA.  His last duplex showed < 39 % B ICA stenosis.    He denies claudication, rest pain or non healing wounds.  He has acid reflux, without sever abdominal pain or lumbar pain.    No change in medications The pt is on a statin for cholesterol management.  The pt is not on a daily aspirin.   Other AC: Eliquis The pt is on Norvasc, Zestril for hypertension.   The pt is not diabetic.   Tobacco hx: Former smoker, quit 2007  Past Medical History:  Diagnosis Date   AAA (abdominal aortic aneurysm)    Aortic stenosis, severe    a. 02/2017: s/p TAVR with Edwards Sapien 3 THV (size 26 mm, model # 9600TFX, serial # [id number])   Arthritis    Atherosclerotic peripheral vascular disease ([place])    Atrial fibrillation, persistent ([place])    a. not a candidate for [facility] given hx of GI bleed   Bilateral carotid artery stenosis    Bladder cancer (HCC)    COPD (chronic obstructive pulmonary disease) ([place])    Gastroesophageal reflux disease without esophagitis 05/25/2019   Hyperlipidemia    Hypertension    Hypothyroidism    Lung cancer ([place])    Right   Lung mass    Right   Skin cancer    Vitamin D deficiency     Past Surgical History:  Procedure Laterality Date   ABDOMINAL AORTIC ANEURYSM REPAIR  08/09/2009   BACK SURGERY     x2   CATARACT EXTRACTION BILATERAL W/ ANTERIOR VITRECTOMY Bilateral 2022   CYSTOSCOPY W/ RETROGRADES Bilateral 09/02/2018   Procedure: CYSTOSCOPY WITH RETROGRADE PYELOGRAM;  Surgeon: Franchot Gallo, MD;  Location: [facility];  Service: Urology;  Laterality: Bilateral;   CYSTOSCOPY W/ RETROGRADES Bilateral 03/20/2020    Procedure: CYSTOSCOPY WITH BILATERAL RETROGRADE PYELOGRAM;  Surgeon: Franchot Gallo, MD;  Location: [facility];  Service: Urology;  Laterality: Bilateral;   CYSTOSCOPY WITH BIOPSY N/A 09/02/2018   Procedure: CYSTOSCOPY WITH BIOPSY;  Surgeon: Franchot Gallo, MD;  Location: [facility];  Service: Urology;  Laterality: N/A;   ESOPHAGOGASTRODUODENOSCOPY N/A 02/21/2017   Procedure: ESOPHAGOGASTRODUODENOSCOPY (EGD);  Surgeon: Jerene Bears, MD;  Location: [facility];  Service: Gastroenterology;  Laterality: N/A;   HYDROCELE EXCISION / REPAIR     LOBECTOMY Right 03/30/2017   Procedure: RIGHT UPPER LOBECTOMY LUNG;  Surgeon: Melrose Nakayama, MD;  Location: [place];  Service: Thoracic;  Laterality: Right;   SKIN CANCER EXCISION     SPINE SURGERY     TEE WITHOUT CARDIOVERSION N/A 02/24/2017   Procedure: TRANSESOPHAGEAL ECHOCARDIOGRAM (TEE);  Surgeon: Sherren Mocha, MD;  Location: [place];  Service: Open Heart Surgery;  Laterality: N/A;   TRANSCATHETER AORTIC VALVE REPLACEMENT, TRANSFEMORAL N/A 02/24/2017   Procedure: TRANSCATHETER AORTIC VALVE REPLACEMENT, TRANSFEMORAL using a 19mm Edwards Sapien 3 Aortic Valve;  Surgeon: Sherren Mocha, MD;  Location: [place];  Service: Open Heart Surgery;  Laterality: N/A;   TRANSURETHRAL RESECTION OF BLADDER TUMOR N/A 06/06/2016   Procedure: TRANSURETHRAL RESECTION OF BLADDER TUMOR (TURBT);  Surgeon: Franchot Gallo, MD;  Location: [facility];  Service: Urology;  Laterality: N/A;  TRANSURETHRAL RESECTION OF BLADDER TUMOR N/A 07/22/2016   Procedure: TRANSURETHRAL RESECTION OF BLADDER TUMOR (TURBT);  Surgeon: Franchot Gallo, MD;  Location: [facility];  Service: Urology;  Laterality: N/A;   TRANSURETHRAL RESECTION OF BLADDER TUMOR N/A 03/20/2020   Procedure: TRANSURETHRAL RESECTION OF BLADDER TUMOR (TURBT);  Surgeon: Franchot Gallo, MD;  [facility]: [facility];  Service: Urology;  Laterality: N/A;   VIDEO ASSISTED THORACOSCOPY (VATS)/WEDGE RESECTION  Right 03/30/2017   Procedure: VIDEO ASSISTED THORACOSCOPY (VATS)/WEDGE RESECTION;  Surgeon: Melrose Nakayama, MD;  Location: [facility];  Service: Thoracic;  Laterality: Right;     Social History Social History   Tobacco Use   Smoking status: Former    Packs/day: 1.00    Years: 51.00    Pack years: 51.00    Types: Cigarettes    Start date: 11/25/1954    Quit date: 10/27/2005    Years since quitting: 15.8   Smokeless tobacco: Never  Vaping Use   Vaping Use: Never used  Substance Use Topics   Alcohol use: No    Alcohol/week: 0.0 standard drinks    Comment: none since 1999   Drug use: No    Family History Family History  Problem Relation Age of Onset   Heart disease Father        Heart Disease before age 14   Alcohol abuse Father    Heart attack Father    Cancer Mother        Lung   COPD Mother    COPD Sister    Lupus Sister    Hypertension Brother    Gout Brother    Heart attack Son    Gout Brother    Prostatitis Brother     Allergies  Allergies  Allergen Reactions   Lovenox [Enoxaparin]     HIT panel ordered 10/10     Current Outpatient Medications  Medication Sig Dispense Refill   alfuzosin (UROXATRAL) 10 MG 24 hr tablet Take 1 tablet (10 mg total) by mouth at bedtime. 90 tablet 1   amLODipine (NORVASC) 10 MG tablet Take 1 tablet (10 mg total) by mouth daily. 90 tablet 1   apixaban (ELIQUIS) 5 MG TABS tablet Take 1 tablet (5 mg total) by mouth 2 (two) times daily. 60 tablet 6   atorvastatin (LIPITOR) 40 MG tablet Take 1 tablet (40 mg total) by mouth daily. 90 tablet 1   cholecalciferol (VITAMIN D) 1000 UNITS tablet Take 1 tablet (1,000 Units total) by mouth daily. 30 tablet 6   ferrous gluconate (FERGON) 324 MG tablet Take 1 tablet (324 mg total) by mouth 2 (two) times daily with a meal. 180 tablet 1   levothyroxine (SYNTHROID) 88 MCG tablet Take 1 tablet (88 mcg total) by mouth daily. 90 tablet 1   lisinopril (ZESTRIL) 40 MG tablet Take 1 tablet (40 mg  total) by mouth daily. 90 tablet 1   metoprolol succinate (TOPROL-XL) 25 MG 24 hr tablet Take 0.5 tablets (12.5 mg total) by mouth daily. 45 tablet 1   Multiple Vitamin (MULTIVITAMIN WITH MINERALS) TABS tablet Take 1 tablet by mouth daily. One A Day Multivitamin for Men     Omega-3 Fatty Acids (FISH OIL) 1200 MG CAPS Take 2,400 mg by mouth daily.      pantoprazole (PROTONIX) 40 MG tablet TAKE  (1)  TABLET TWICE A DAY. 180 tablet 1   No current facility-administered medications for this visit.    ROS:   General:  No weight loss, Fever, chills  HEENT: No  recent headaches, no nasal bleeding, no visual changes, no sore throat  Neurologic: No dizziness, blackouts, seizures. No recent symptoms of stroke or mini- stroke. No recent episodes of slurred speech, or temporary blindness.  Cardiac: No recent episodes of chest pain/pressure, no shortness of breath at rest.  No shortness of breath with exertion.  Denies history of atrial fibrillation or irregular heartbeat  Vascular: No history of rest pain in feet.  No history of claudication.  No history of non-healing ulcer, No history of DVT   Pulmonary: No home oxygen, no productive cough, no hemoptysis,  No asthma or wheezing  Musculoskeletal:  [ ]  Arthritis, [ ]  Low back pain,  [ ]  Joint pain  Hematologic:No history of hypercoagulable state.  No history of easy bleeding.  No history of anemia  Gastrointestinal: No hematochezia or melena,  positive gastroesophageal reflux, no trouble swallowing  Urinary: [ ]  chronic Kidney disease, [ ]  on HD - [ ]  MWF or [ ]  TTHS, [ ]  Burning with urination, [ ]  Frequent urination, [ ]  Difficulty urinating;   Skin: No rashes  Psychological: No history of anxiety,  No history of depression   Physical Examination  Vitals:   08/26/21 0928  BP: (!) 141/68  Pulse: (!) 57  Resp: 16  Temp: 97.7 F (36.5 C)  TempSrc: Temporal  SpO2: 99%  Weight: 164 lb (74.4 kg)  Height: 5' 9.5" (1.765 m)    Body  mass index is 23.87 kg/m.  General:  Alert and oriented, no acute distress HEENT: Normal Neck: No bruit or JVD Pulmonary: Clear to auscultation bilaterally Cardiac: Regular Rate and Rhythm without murmur Gastrointestinal: Soft, non-tender, non-distended, no mass, no scars Skin: No rash Extremity Pulses:  2+ radial, brachial, femoral, dorsalis pedis pulses bilaterally Musculoskeletal: No deformity or edema  Neurologic: Upper and lower extremity motor 5/5 and symmetric  DATA:  Endovascular Aortic Repair (EVAR):  +----------+----------------+-------------------+-------------------+              Diameter AP (cm) Diameter Trans (cm) Velocities (cm/sec)   +----------+----------------+-------------------+-------------------+   Aorta      3.91             4.18                52                    +----------+----------------+-------------------+-------------------+   Right Limb 1.40             1.50                93                    +----------+----------------+-------------------+-------------------+   Left Limb  1.48             1.36                60                    +----------+----------------+-------------------+-------------------+    Summary:  Abdominal Aorta: The largest aortic diameter remains essentially unchanged  compared to prior exam. Previous diameter measurement was 4.1 cm obtained  on 08/23/2020.    ASSESSMENT/PLAN: AAA s/p EVAR 2011 He denies new symptoms of sever abdominal/lumbar pain.  He stays active on a daily basis.  His duplex shows no evidence of expansion of the AAA and essentially unchanged compared to previous studies.    He has no detectable carotid bruit on exam  and we will follow his ICA's on duplex every 2 years.  His last carotid duplex shows < 36%% B ICA stenosis.  He will f/u in 1 year for repeat EVAR and carotid duplex.  If he develops symptoms of stroke or LE ischemia he will call.     Roxy Horseman PA-C [facility] [place] Office: [phone]  MD in clinic [place]

## 2021-08-27 ENCOUNTER — Ambulatory Visit: Payer: Medicare Other | Admitting: Urology

## 2021-08-27 VITALS — BP 129/64 | HR 52

## 2021-08-27 DIAGNOSIS — R300 Dysuria: Secondary | ICD-10-CM

## 2021-08-27 DIAGNOSIS — Z8551 Personal history of malignant neoplasm of bladder: Secondary | ICD-10-CM

## 2021-08-27 LAB — URINALYSIS, ROUTINE W REFLEX MICROSCOPIC
Bilirubin, UA: NEGATIVE
Glucose, UA: NEGATIVE
Ketones, UA: NEGATIVE
Nitrite, UA: NEGATIVE
Protein,UA: NEGATIVE
RBC, UA: NEGATIVE
Specific Gravity, UA: 1.02 (ref 1.005–1.030)
Urobilinogen, Ur: 0.2 mg/dL (ref 0.2–1.0)
pH, UA: 5.5 (ref 5.0–7.5)

## 2021-08-27 LAB — MICROSCOPIC EXAMINATION
RBC, Urine: NONE SEEN /hpf (ref 0–2)
Renal Epithel, UA: NONE SEEN /hpf
WBC, UA: >30 /hpf — AB (ref 0–5)

## 2021-08-27 MED ORDER — CIPROFLOXACIN HCL 500 MG PO TABS
500.0000 mg | ORAL_TABLET | Freq: Once | ORAL | Status: AC
  Administered 2021-08-27: 500 mg via ORAL

## 2021-08-27 NOTE — Progress Notes (Signed)
H&P  Chief Complaint: Follow-up for bladder cancer  History of Present Illness:  He underwent emergent TURBT on 12.15.2017 for a large bladder cancer burden at his right bladder neck, posterior dome and left lateral wall. He did well after the procedure. He apparently been bleeding for 3 months. He is a cigarette smoker.    Pathology revealed high-grade, NMIBC. Muscle was present in the specimen.    He underwent repeat resection on 1.30.2018. 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    He was initiated on BCG and competed his 6 induction treatments on 3.27.2018.    5.15.2018 -he was found to have a small papillary recurrence on his anterior bladder wall--he underwent cysto/cauterization of this on 5.17.2018.  7.31.2018. 1st 3 maintenance BCG's completed  9.18.2018: Office cysto negative  10.24.2018: completed 3 maintenance BCG Rx's  1.22.2019: Surveillance cystoscopy.  2.19.2019: Completed 3 maintenance BCG treatments  4.2.2019: He has had no blood or burning with urination since his BCG has been completed. He tolerated the BCG well.  2.25.2020--Cysto--small BT recurrence.  3.12.2020: TURBT/bilateral RGPs. Cytologies of renal washings nml. Bladder tumor LG NMIBC.  6.2.2020: Cysto negative.  10.6.2020: Cystoscopy negative 3.2021: Cystoscopy negative, cytology with atypia 11.09.2021: Repeat TURBT/gemcitabine 9.28.2021. Path--benign (inflammation). 3.8.2022: Cysto negative, cytology--NEGATIVE FOR HIGH-GRADE UROTHELIAL CARCINOMA ([place]).  REACTIVE UROTHELIAL CELLS ARE PRESENT.  CELLULAR DEGENERATION IS PRESENT.  ACUTE INFLAMMATION AND BACTERIA ARE PRESENT.  9.6.2022: Cystoscopy negative.  Cytology negative.  3.7.2023: He felt like he had a urinary tract infection last week.  He is not having any dysuria now, has had no gross hematuria since his last visit 3 months ago.    Past Medical History:  Diagnosis Date   AAA (abdominal aortic  aneurysm)    Aortic stenosis, severe    a. 02/2017: s/p TAVR with Edwards Sapien 3 THV (size 26 mm, model # 9600TFX, serial # [id number])   Arthritis    Atherosclerotic peripheral vascular disease (HCC)    Atrial fibrillation, persistent (HCC)    a. not a candidate for [facility] given hx of GI bleed   Bilateral carotid artery stenosis    Bladder cancer (HCC)    COPD (chronic obstructive pulmonary disease) ([place])    Gastroesophageal reflux disease without esophagitis 05/25/2019   Hyperlipidemia    Hypertension    Hypothyroidism    Lung cancer ([place])    Right   Lung mass    Right   Skin cancer    Vitamin D deficiency     Past Surgical History:  Procedure Laterality Date   ABDOMINAL AORTIC ANEURYSM REPAIR  08/09/2009   BACK SURGERY     x2   CATARACT EXTRACTION BILATERAL W/ ANTERIOR VITRECTOMY Bilateral 2022   CYSTOSCOPY W/ RETROGRADES Bilateral 09/02/2018   Procedure: CYSTOSCOPY WITH RETROGRADE PYELOGRAM;  Surgeon: Franchot Gallo, MD;  Location: [facility];  Service: Urology;  Laterality: Bilateral;   CYSTOSCOPY W/ RETROGRADES Bilateral 03/20/2020   Procedure: CYSTOSCOPY WITH BILATERAL RETROGRADE PYELOGRAM;  Surgeon: Franchot Gallo, MD;  [facility];  Service: Urology;  Laterality: Bilateral;   CYSTOSCOPY WITH BIOPSY N/A 09/02/2018   Procedure: CYSTOSCOPY WITH BIOPSY;  Surgeon: Franchot Gallo, MD;  Location: [facility];  Service: Urology;  Laterality: N/A;   ESOPHAGOGASTRODUODENOSCOPY N/A 02/21/2017   Procedure: ESOPHAGOGASTRODUODENOSCOPY (EGD);  Surgeon: Jerene Bears, MD;  Location: [facility];  Service: Gastroenterology;  Laterality: N/A;   HYDROCELE EXCISION / REPAIR     LOBECTOMY Right 03/30/2017   Procedure: RIGHT UPPER  LOBECTOMY LUNG;  Surgeon: Melrose Nakayama, MD;  Location: [place];  Service: Thoracic;  Laterality: Right;   SKIN CANCER EXCISION     SPINE SURGERY     TEE WITHOUT CARDIOVERSION N/A 02/24/2017   Procedure:  TRANSESOPHAGEAL ECHOCARDIOGRAM (TEE);  Surgeon: Sherren Mocha, MD;  Location: [place];  Service: Open Heart Surgery;  Laterality: N/A;   TRANSCATHETER AORTIC VALVE REPLACEMENT, TRANSFEMORAL N/A 02/24/2017   Procedure: TRANSCATHETER AORTIC VALVE REPLACEMENT, TRANSFEMORAL using a 27mm Edwards Sapien 3 Aortic Valve;  Surgeon: Sherren Mocha, MD;  Location: [place];  Service: Open Heart Surgery;  Laterality: N/A;   TRANSURETHRAL RESECTION OF BLADDER TUMOR N/A 06/06/2016   Procedure: TRANSURETHRAL RESECTION OF BLADDER TUMOR (TURBT);  Surgeon: Franchot Gallo, MD;  [facility]: [facility];  Service: Urology;  Laterality: N/A;   TRANSURETHRAL RESECTION OF BLADDER TUMOR N/A 07/22/2016   Procedure: TRANSURETHRAL RESECTION OF BLADDER TUMOR (TURBT);  Surgeon: Franchot Gallo, MD;  Location: [facility];  Service: Urology;  Laterality: N/A;   TRANSURETHRAL RESECTION OF BLADDER TUMOR N/A 03/20/2020   Procedure: TRANSURETHRAL RESECTION OF BLADDER TUMOR (TURBT);  Surgeon: Franchot Gallo, MD;  Location: [facility];  Service: Urology;  Laterality: N/A;   VIDEO ASSISTED THORACOSCOPY (VATS)/WEDGE RESECTION Right 03/30/2017   Procedure: VIDEO ASSISTED THORACOSCOPY (VATS)/WEDGE RESECTION;  Surgeon: Melrose Nakayama, MD;  Location: [place];  Service: Thoracic;  Laterality: Right;    Home Medications:  Allergies as of 08/27/2021       Reactions   Lovenox [enoxaparin]    HIT panel ordered 10/10        Medication List        Accurate as of August 27, 2021  1:59 PM. If you have any questions, ask your nurse or doctor.          alfuzosin 10 MG 24 hr tablet Commonly known as: UROXATRAL Take 1 tablet (10 mg total) by mouth at bedtime.   amLODipine 10 MG tablet Commonly known as: NORVASC Take 1 tablet (10 mg total) by mouth daily.   apixaban 5 MG Tabs tablet Commonly known as: Eliquis Take 1 tablet (5 mg total) by mouth 2 (two) times daily.   atorvastatin 40 MG tablet Commonly known as: LIPITOR Take 1 tablet  (40 mg total) by mouth daily.   cholecalciferol 1000 units tablet Commonly known as: VITAMIN D Take 1 tablet (1,000 Units total) by mouth daily.   ferrous gluconate 324 MG tablet Commonly known as: FERGON Take 1 tablet (324 mg total) by mouth 2 (two) times daily with a meal.   Fish Oil 1200 MG Caps Take 2,400 mg by mouth daily.   levothyroxine 88 MCG tablet Commonly known as: SYNTHROID Take 1 tablet (88 mcg total) by mouth daily.   lisinopril 40 MG tablet Commonly known as: ZESTRIL Take 1 tablet (40 mg total) by mouth daily.   metoprolol succinate 25 MG 24 hr tablet Commonly known as: TOPROL-XL Take 0.5 tablets (12.5 mg total) by mouth daily.   multivitamin with minerals Tabs tablet Take 1 tablet by mouth daily. One A Day Multivitamin for Men   pantoprazole 40 MG tablet Commonly known as: PROTONIX TAKE  (1)  TABLET TWICE A DAY.        Allergies:  Allergies  Allergen Reactions   Lovenox [Enoxaparin]     HIT panel ordered 10/10    Family History  Problem Relation Age of Onset   Heart disease Father        Heart Disease before age 19  Alcohol abuse Father    Heart attack Father    Cancer Mother        Lung   COPD Mother    COPD Sister    Lupus Sister    Hypertension Brother    Gout Brother    Heart attack Son    Gout Brother    Prostatitis Brother     Social History:  reports that he quit smoking about 15 years ago. His smoking use included cigarettes. He started smoking about 66 years ago. He has a 51.00 pack-year smoking history. He has never used smokeless tobacco. He reports that he does not drink alcohol and does not use drugs.  ROS: A complete review of systems was performed.  All systems are negative except for pertinent findings as noted.  Physical Exam:  Vital signs in last 24 hours: BP 129/64    Pulse (!) 52  Constitutional:  Alert and oriented, No acute distress Cardiovascular: Regular rate  Respiratory: Normal respiratory effort GI:  Abdomen is soft, nontender, nondistended, no abdominal masses. No CVAT.  Genitourinary: Normal male phallus, testes are descended bilaterally and non-tender and without masses, scrotum is normal in appearance without lesions or masses, perineum is normal on inspection. Lymphatic: No lymphadenopathy Neurologic: Grossly intact, no focal deficits Psychiatric: Normal mood and affect  I have reviewed prior pt notes  I have reviewed notes from referring/previous physicians  I have reviewed urinalysis results  Cytology results reviewed  Cystoscopy Procedure Note:  Indication: Bladder cancer surveillance  After informed consent and discussion of the procedure and its risks, [name] [name] was positioned and prepped in the standard fashion.  Cystoscopy was performed with a flexible cystoscope.   Findings: Urethra: No stricture or lesion. Prostate: Minimally obstructive Bladder neck: Normal Ureteral orifices: Normal position and configuration Bladder: No evidence of bladder stones, minimal trabeculation, no bladder tumors  The patient tolerated the procedure well.      Impression/Assessment:  History of bladder cancer without evidence of recurrence  Plan:  I will send urine for culture today as well as cytology  I will have him come back in 1 year for cystoscopy

## 2021-08-29 LAB — CYTOLOGY, URINE

## 2021-08-30 ENCOUNTER — Other Ambulatory Visit: Payer: Self-pay | Admitting: Urology

## 2021-08-30 DIAGNOSIS — N3000 Acute cystitis without hematuria: Secondary | ICD-10-CM

## 2021-08-30 LAB — URINE CULTURE

## 2021-08-30 MED ORDER — CEPHALEXIN 500 MG PO CAPS: 500.0000 mg | ORAL_CAPSULE | Freq: Two times a day (BID) | ORAL | 0 refills | Status: AC

## 2021-11-11 NOTE — Progress Notes (Unsigned)
Cardiology Office Note   Date:  11/13/2021   ID:  [name] [name], [name] [name] 08-10-1940, MRN [id number]  PCP:  [name], FNP  Cardiologist:   None   Chief Complaint  Patient presents with   Dizziness      History of Present Illness: [name] [name] is a 81 y.o. male with a history of TAVR, CAD, permanent atrial fibrillation, HLD, PVD, history of aneurysm repair of abdominal aorta using endovascular graft.  History of lung CA right lung.  History of recurrent bladder cancer with TURBT, history of smoking, COPD, HTN.  Since I last saw him he has done well..   He has a little bit of dizziness occasionally if he stands up quickly but he has not had any frank syncope.  The patient denies any new symptoms such as chest discomfort, neck or arm discomfort. There has been no new shortness of breath, PND or orthopnea. There have been no reported palpitations, presyncope or syncope.    Past Medical History:  Diagnosis Date   AAA (abdominal aortic aneurysm) ([place])    Aortic stenosis, severe    a. 02/2017: s/p TAVR with Edwards Sapien 3 THV (size 26 mm, model # 9600TFX, serial # [id number])   Arthritis    Atherosclerotic peripheral vascular disease (HCC)    Atrial fibrillation, persistent (HCC)    a. not a candidate for [facility] given hx of GI bleed   Bilateral carotid artery stenosis    Bladder cancer (HCC)    COPD (chronic obstructive pulmonary disease) ([place])    Gastroesophageal reflux disease without esophagitis 05/25/2019   Hyperlipidemia    Hypertension    Hypothyroidism    Lung cancer ([place])    Right   Lung mass    Right   Skin cancer    Vitamin D deficiency     Past Surgical History:  Procedure Laterality Date   ABDOMINAL AORTIC ANEURYSM REPAIR  08/09/2009   BACK SURGERY     x2   CATARACT EXTRACTION BILATERAL W/ ANTERIOR VITRECTOMY Bilateral 2022   CYSTOSCOPY W/ RETROGRADES Bilateral 09/02/2018   Procedure: CYSTOSCOPY WITH RETROGRADE PYELOGRAM;  Surgeon: Franchot Gallo, MD;  Location: [facility];  Service: Urology;  Laterality: Bilateral;   CYSTOSCOPY W/ RETROGRADES Bilateral 03/20/2020   Procedure: CYSTOSCOPY WITH BILATERAL RETROGRADE PYELOGRAM;  Surgeon: Franchot Gallo, MD;  [facility];  Service: Urology;  Laterality: Bilateral;   CYSTOSCOPY WITH BIOPSY N/A 09/02/2018   Procedure: CYSTOSCOPY WITH BIOPSY;  Surgeon: Franchot Gallo, MD;  Location: [facility];  Service: Urology;  Laterality: N/A;   ESOPHAGOGASTRODUODENOSCOPY N/A 02/21/2017   Procedure: ESOPHAGOGASTRODUODENOSCOPY (EGD);  Surgeon: Jerene Bears, MD;  Location: [facility];  Service: Gastroenterology;  Laterality: N/A;   HYDROCELE EXCISION / REPAIR     LOBECTOMY Right 03/30/2017   Procedure: RIGHT UPPER LOBECTOMY LUNG;  Surgeon: Melrose Nakayama, MD;  Location: [place];  Service: Thoracic;  Laterality: Right;   SKIN CANCER EXCISION     SPINE SURGERY     TEE WITHOUT CARDIOVERSION N/A 02/24/2017   Procedure: TRANSESOPHAGEAL ECHOCARDIOGRAM (TEE);  Surgeon: Sherren Mocha, MD;  Location: [place];  Service: Open Heart Surgery;  Laterality: N/A;   TRANSCATHETER AORTIC VALVE REPLACEMENT, TRANSFEMORAL N/A 02/24/2017   Procedure: TRANSCATHETER AORTIC VALVE REPLACEMENT, TRANSFEMORAL using a 55mm Edwards Sapien 3 Aortic Valve;  Surgeon: Sherren Mocha, MD;  Location: [place];  Service: Open Heart Surgery;  Laterality: N/A;   TRANSURETHRAL RESECTION OF BLADDER TUMOR N/A 06/06/2016  Procedure: TRANSURETHRAL RESECTION OF BLADDER TUMOR (TURBT);  Surgeon: Franchot Gallo, MD;  [facility];  Service: Urology;  Laterality: N/A;   TRANSURETHRAL RESECTION OF BLADDER TUMOR N/A 07/22/2016   Procedure: TRANSURETHRAL RESECTION OF BLADDER TUMOR (TURBT);  Surgeon: Franchot Gallo, MD;  [facility];  Service: Urology;  Laterality: N/A;   TRANSURETHRAL RESECTION OF BLADDER TUMOR N/A 03/20/2020   Procedure: TRANSURETHRAL RESECTION OF BLADDER TUMOR (TURBT);   Surgeon: Franchot Gallo, MD;  [facility];  Service: Urology;  Laterality: N/A;   VIDEO ASSISTED THORACOSCOPY (VATS)/WEDGE RESECTION Right 03/30/2017   Procedure: VIDEO ASSISTED THORACOSCOPY (VATS)/WEDGE RESECTION;  Surgeon: Melrose Nakayama, MD;  [facility];  Service: Thoracic;  Laterality: Right;     Current Outpatient Medications  Medication Sig Dispense Refill   alfuzosin (UROXATRAL) 10 MG 24 hr tablet Take 1 tablet (10 mg total) by mouth at bedtime. 90 tablet 1   amLODipine (NORVASC) 10 MG tablet Take 1 tablet (10 mg total) by mouth daily. 90 tablet 1   apixaban (ELIQUIS) 5 MG TABS tablet Take 1 tablet (5 mg total) by mouth 2 (two) times daily. 60 tablet 6   atorvastatin (LIPITOR) 40 MG tablet Take 1 tablet (40 mg total) by mouth daily. 90 tablet 1   cholecalciferol (VITAMIN D) 1000 UNITS tablet Take 1 tablet (1,000 Units total) by mouth daily. 30 tablet 6   ferrous gluconate (FERGON) 324 MG tablet Take 1 tablet (324 mg total) by mouth 2 (two) times daily with a meal. 180 tablet 1   levothyroxine (SYNTHROID) 88 MCG tablet Take 1 tablet (88 mcg total) by mouth daily. 90 tablet 1   lisinopril (ZESTRIL) 40 MG tablet Take 1 tablet (40 mg total) by mouth daily. 90 tablet 1   metoprolol succinate (TOPROL-XL) 25 MG 24 hr tablet Take 0.5 tablets (12.5 mg total) by mouth daily. 45 tablet 1   Multiple Vitamin (MULTIVITAMIN WITH MINERALS) TABS tablet Take 1 tablet by mouth daily. One A Day Multivitamin for Men     Omega-3 Fatty Acids (FISH OIL) 1200 MG CAPS Take 2,400 mg by mouth daily.      pantoprazole (PROTONIX) 40 MG tablet TAKE  (1)  TABLET TWICE A DAY. 180 tablet 1   No current facility-administered medications for this visit.    Allergies:   Lovenox [enoxaparin]    ROS:  Please see the history of present illness.   Otherwise, review of systems are positive for none.   All other systems are reviewed and negative.    PHYSICAL EXAM: VS:  BP (!) 146/72   Pulse (!) 59    Ht 5' 9.5" (1.765 m)   Wt 171 lb 6.4 oz (77.7 kg)   SpO2 98%   BMI 24.95 kg/m  , BMI Body mass index is 24.95 kg/m. GENERAL:  Well appearing NECK:  No jugular venous distention, waveform within normal limits, carotid upstroke brisk and symmetric, no bruits, no thyromegaly LUNGS:  Clear to auscultation bilaterally CHEST:  Unremarkable HEART:  PMI not displaced or sustained,S1 and S2 within normal limits, no S3, no clicks, no rubs, no murmurs, irregular  ABD:  Flat, positive bowel sounds normal in frequency in pitch, no bruits, no rebound, no guarding, no midline pulsatile mass, no hepatomegaly, no splenomegaly EXT:  2 plus pulses throughout, right leg mild edema, no cyanosis no clubbing   EKG:  EKG is  ordered today. The ekg ordered today demonstrates atrial fibrillation, rate 59, low voltage in the limb leads, poor anterior  R wave progression, nonspecific T wave flattening.   Recent Labs: 06/06/2021: ALT 15; BUN 19; Creatinine, Ser 1.18; Hemoglobin 13.5; Platelets 151; Potassium 4.3; Sodium 140; TSH 2.000    Lipid Panel    Component Value Date/Time   CHOL 118 06/06/2021 1032   CHOL 118 01/14/2013 1157   TRIG 131 06/06/2021 1032   TRIG 160 (H) 04/13/2014 0846   TRIG 178 (H) 01/14/2013 1157   HDL 29 (L) 06/06/2021 1032   HDL 32 (L) 04/13/2014 0846   HDL 30 (L) 01/14/2013 1157   CHOLHDL 4.1 06/06/2021 1032   LDLCALC 65 06/06/2021 1032   LDLCALC 61 04/13/2014 0846   LDLCALC 52 01/14/2013 1157      Wt Readings from Last 3 Encounters:  11/13/21 171 lb 6.4 oz (77.7 kg)  08/26/21 164 lb (74.4 kg)  06/06/21 168 lb (76.2 kg)      Other studies Reviewed: Additional studies/ records that were reviewed today include: Labs. Review of the above records demonstrates:  Please see elsewhere in the note.     ASSESSMENT AND PLAN:  Nonrheumatic aortic valve stenosis:    He is status post Edwards APN 326 mm bioprosthetic TAVR.  I will follow-up with an echocardiogram.  He has no  teeth so he does not need SBE prophylaxis.  No change in therapy.   Permanent atrial fibrillation [facility]):   He tolerates this.  He does have a slow rate and I told him that if he gets any lightheadedness or dizziness beyond what he has we could discontinue the beta-blocker.  Otherwise he is tolerating anticoagulation.  No change in therapy.   CAD in native artery:  The patient has no new sypmtoms.  No further cardiovascular testing is indicated.  We will continue with aggressive risk reduction and meds as listed.  He had nonobstructive disease in 2018.  Mixed hyperlipidemia: LDL was 65 with an HDL of 22 in December.  No change in therapy.   PVD (peripheral vascular disease) [facility]): He is status post EVAR.  He is followed by [facility]     Current medicines are reviewed at length with the patient today.  The patient does not have concerns regarding medicines.  The following changes have been made:   None  Labs/ tests ordered today include:  None  Orders Placed This Encounter  Procedures   EKG 12-Lead   ECHOCARDIOGRAM COMPLETE     Disposition:   FU with 12 months.     Signed, Minus Breeding, MD  11/13/2021 10:06 AM    [facility]

## 2021-11-13 ENCOUNTER — Ambulatory Visit: Payer: Medicare Other | Admitting: Cardiology

## 2021-11-13 ENCOUNTER — Encounter: Payer: Self-pay | Admitting: Cardiology

## 2021-11-13 VITALS — BP 146/72 | HR 59 | Ht 69.5 in | Wt 171.4 lb

## 2021-11-13 DIAGNOSIS — Z952 Presence of prosthetic heart valve: Secondary | ICD-10-CM

## 2021-11-13 DIAGNOSIS — I350 Nonrheumatic aortic (valve) stenosis: Secondary | ICD-10-CM

## 2021-11-13 DIAGNOSIS — I4821 Permanent atrial fibrillation: Secondary | ICD-10-CM | POA: Diagnosis not present

## 2021-11-13 DIAGNOSIS — I739 Peripheral vascular disease, unspecified: Secondary | ICD-10-CM | POA: Diagnosis not present

## 2021-11-13 DIAGNOSIS — I25118 Atherosclerotic heart disease of native coronary artery with other forms of angina pectoris: Secondary | ICD-10-CM | POA: Diagnosis not present

## 2021-11-13 DIAGNOSIS — E785 Hyperlipidemia, unspecified: Secondary | ICD-10-CM

## 2021-11-13 NOTE — Patient Instructions (Signed)
Medication Instructions:  The current medical regimen is effective;  continue present plan and medications.  *If you need a refill on your cardiac medications before your next appointment, please call your pharmacy*  Testing/Procedures: Your physician has requested that you have an echocardiogram. Echocardiography is a painless test that uses sound waves to create images of your heart. It provides your doctor with information about the size and shape of your heart and how well your heart's chambers and valves are working. This procedure takes approximately one hour. There are no restrictions for this procedure.  Follow-Up: At [facility], you and your health needs are our priority.  As part of our continuing mission to provide you with exceptional heart care, we have created designated Provider Care Teams.  These Care Teams include your primary Cardiologist (physician) and Advanced Practice Providers (APPs -  Physician Assistants and Nurse Practitioners) who all work together to provide you with the care you need, when you need it.  We recommend signing up for the patient portal called "MyChart".  Sign up information is provided on this After Visit Summary.  MyChart is used to connect with patients for Virtual Visits (Telemedicine).  Patients are able to view lab/test results, encounter notes, upcoming appointments, etc.  Non-urgent messages can be sent to your provider as well.   To learn more about what you can do with MyChart, go to [email or web address].    Your next appointment:    1 year(s)  The format for your next appointment:   In Person  Provider:   Minus Breeding, MD{   Important Information About Sugar

## 2021-11-27 ENCOUNTER — Ambulatory Visit (HOSPITAL_COMMUNITY): Payer: Medicare Other | Attending: Cardiovascular Disease

## 2021-11-27 DIAGNOSIS — Z952 Presence of prosthetic heart valve: Secondary | ICD-10-CM | POA: Diagnosis not present

## 2021-11-27 DIAGNOSIS — I350 Nonrheumatic aortic (valve) stenosis: Secondary | ICD-10-CM | POA: Insufficient documentation

## 2021-11-27 LAB — ECHOCARDIOGRAM COMPLETE
AR max vel: 1.56 cm2
AV Area VTI: 1.61 cm2
AV Area mean vel: 1.68 cm2
AV Mean grad: 9.2 mmHg
AV Peak grad: 20.2 mmHg
Ao pk vel: 2.25 m/s
Area-P 1/2: 3.24 cm2
S' Lateral: 3 cm

## 2021-12-04 ENCOUNTER — Encounter: Payer: Self-pay | Admitting: *Deleted

## 2021-12-05 ENCOUNTER — Ambulatory Visit (INDEPENDENT_AMBULATORY_CARE_PROVIDER_SITE_OTHER): Payer: Medicare Other | Admitting: Nurse Practitioner

## 2021-12-05 ENCOUNTER — Encounter: Payer: Self-pay | Admitting: Nurse Practitioner

## 2021-12-05 VITALS — BP 127/72 | HR 78 | Temp 98.1°F | Resp 20 | Ht 69.0 in | Wt 169.0 lb

## 2021-12-05 DIAGNOSIS — E034 Atrophy of thyroid (acquired): Secondary | ICD-10-CM | POA: Diagnosis not present

## 2021-12-05 DIAGNOSIS — J449 Chronic obstructive pulmonary disease, unspecified: Secondary | ICD-10-CM | POA: Diagnosis not present

## 2021-12-05 DIAGNOSIS — I25118 Atherosclerotic heart disease of native coronary artery with other forms of angina pectoris: Secondary | ICD-10-CM | POA: Diagnosis not present

## 2021-12-05 DIAGNOSIS — C3491 Malignant neoplasm of unspecified part of right bronchus or lung: Secondary | ICD-10-CM | POA: Diagnosis not present

## 2021-12-05 DIAGNOSIS — I10 Essential (primary) hypertension: Secondary | ICD-10-CM

## 2021-12-05 DIAGNOSIS — N400 Enlarged prostate without lower urinary tract symptoms: Secondary | ICD-10-CM

## 2021-12-05 DIAGNOSIS — K219 Gastro-esophageal reflux disease without esophagitis: Secondary | ICD-10-CM

## 2021-12-05 DIAGNOSIS — I739 Peripheral vascular disease, unspecified: Secondary | ICD-10-CM | POA: Diagnosis not present

## 2021-12-05 DIAGNOSIS — I4821 Permanent atrial fibrillation: Secondary | ICD-10-CM | POA: Diagnosis not present

## 2021-12-05 DIAGNOSIS — E782 Mixed hyperlipidemia: Secondary | ICD-10-CM | POA: Diagnosis not present

## 2021-12-05 DIAGNOSIS — E559 Vitamin D deficiency, unspecified: Secondary | ICD-10-CM | POA: Diagnosis not present

## 2021-12-05 DIAGNOSIS — Z6826 Body mass index (BMI) 26.0-26.9, adult: Secondary | ICD-10-CM

## 2021-12-05 DIAGNOSIS — Z23 Encounter for immunization: Secondary | ICD-10-CM

## 2021-12-05 MED ORDER — AMLODIPINE BESYLATE 10 MG PO TABS: 10.0000 mg | ORAL_TABLET | Freq: Every day | ORAL | 1 refills | Status: DC

## 2021-12-05 MED ORDER — APIXABAN 5 MG PO TABS: 5.0000 mg | ORAL_TABLET | Freq: Two times a day (BID) | ORAL | 6 refills | Status: DC

## 2021-12-05 MED ORDER — ALFUZOSIN HCL ER 10 MG PO TB24: 10.0000 mg | ORAL_TABLET | Freq: Every day | ORAL | 1 refills | Status: DC

## 2021-12-05 MED ORDER — LISINOPRIL 40 MG PO TABS: 40.0000 mg | ORAL_TABLET | Freq: Every day | ORAL | 1 refills | Status: DC

## 2021-12-05 MED ORDER — METOPROLOL SUCCINATE ER 25 MG PO TB24: 12.5000 mg | ORAL_TABLET | Freq: Every day | ORAL | 1 refills | Status: DC

## 2021-12-05 MED ORDER — PANTOPRAZOLE SODIUM 40 MG PO TBEC: DELAYED_RELEASE_TABLET | ORAL | 1 refills | Status: DC

## 2021-12-05 MED ORDER — ATORVASTATIN CALCIUM 40 MG PO TABS: 40.0000 mg | ORAL_TABLET | Freq: Every day | ORAL | 1 refills | Status: DC

## 2021-12-05 MED ORDER — LEVOTHYROXINE SODIUM 88 MCG PO TABS: 88.0000 ug | ORAL_TABLET | Freq: Every day | ORAL | 1 refills | Status: DC

## 2021-12-05 NOTE — Progress Notes (Signed)
Subjective:    Patient ID: [name] [name], male    DOB: 1940-07-22, 81 y.o.   MRN: [id number]   Chief Complaint: medical management of chronic issues     HPI:  [name] [name] is a 81 y.o. who identifies as a male who was assigned male at birth.   Social history: Lives with: wife Work history: retired   Scientist, forensic in today for follow up of the following chronic medical issues:  1. Primary hypertension No c/o chest pain , sob or headache. Does not check blood pressure at home. BP Readings from Last 3 Encounters:  11/13/21 (!) 146/72  08/27/21 129/64  08/26/21 139/79     2. Coronary artery disease of native artery of native heart with stable angina pectoris ([place]) 3. PVD (peripheral vascular disease) ([place]) 4. Permanent atrial fibrillation ([place]) Saw cardiology on 11/13/21. He recently had  TAVR. He was doing well other than occasional dizziness. He wastold if dizziness continued that he could stop his beta blocker since his heart rate was a little on slow side. He is still takin ghis betablocker.  5. Chronic obstructive pulmonary disease, unspecified COPD type ([place]) His breathing is good. He does not see pulmonology  6. Primary Lung Cancer Sees oncology every 6 months. Last visit was 04/18/21. His treatment was upper lobectomy and has been on observation since then. Recent chest CT showed no reoccurence.  7. Gastroesophageal reflux disease without esophagitis Is on protonix daily to prevent symptoms.  8. Hypothyroidism due to acquired atrophy of thyroid No problems that he is aware of. Lab Results  Component Value Date   TSH 2.000 06/06/2021    8. Mixed hyperlipidemia Does try to watch diet but does no dedicated exercise. Lab Results  Component Value Date   CHOL 118 06/06/2021   HDL 29 (L) 06/06/2021   LDLCALC 65 06/06/2021   TRIG 131 06/06/2021   CHOLHDL 4.1 06/06/2021     9. Benign prostatic hyperplasia without lower urinary tract symptoms History of  prostate cancer. Last urology visit was 08/27/21. Had cystoscopy which showed no bladder caner or prostate issues.  10. Vitamin D deficiency Is on daily vitamin d supplement  11. BMI 26.0-26.9,adult No recent weight changes Wt Readings from Last 3 Encounters:  12/05/21 169 lb (76.7 kg)  11/13/21 171 lb 6.4 oz (77.7 kg)  08/26/21 164 lb (74.4 kg)   BMI Readings from Last 3 Encounters:  12/05/21 24.96 kg/m  11/13/21 24.95 kg/m  08/26/21 23.87 kg/m     New complaints: None today  Allergies  Allergen Reactions   Lovenox [Enoxaparin]     HIT panel ordered 10/10   Outpatient Encounter Medications as of 12/05/2021  Medication Sig   alfuzosin (UROXATRAL) 10 MG 24 hr tablet Take 1 tablet (10 mg total) by mouth at bedtime.   amLODipine (NORVASC) 10 MG tablet Take 1 tablet (10 mg total) by mouth daily.   apixaban (ELIQUIS) 5 MG TABS tablet Take 1 tablet (5 mg total) by mouth 2 (two) times daily.   atorvastatin (LIPITOR) 40 MG tablet Take 1 tablet (40 mg total) by mouth daily.   cholecalciferol (VITAMIN D) 1000 UNITS tablet Take 1 tablet (1,000 Units total) by mouth daily.   ferrous gluconate (FERGON) 324 MG tablet Take 1 tablet (324 mg total) by mouth 2 (two) times daily with a meal.   levothyroxine (SYNTHROID) 88 MCG tablet Take 1 tablet (88 mcg total) by mouth daily.   lisinopril (ZESTRIL) 40 MG tablet Take 1 tablet (  40 mg total) by mouth daily.   metoprolol succinate (TOPROL-XL) 25 MG 24 hr tablet Take 0.5 tablets (12.5 mg total) by mouth daily.   Multiple Vitamin (MULTIVITAMIN WITH MINERALS) TABS tablet Take 1 tablet by mouth daily. One A Day Multivitamin for Men   Omega-3 Fatty Acids (FISH OIL) 1200 MG CAPS Take 2,400 mg by mouth daily.    pantoprazole (PROTONIX) 40 MG tablet TAKE  (1)  TABLET TWICE A DAY.   No facility-administered encounter medications on file as of 12/05/2021.    Past Surgical History:  Procedure Laterality Date   ABDOMINAL AORTIC ANEURYSM REPAIR   08/09/2009   BACK SURGERY     x2   CATARACT EXTRACTION BILATERAL W/ ANTERIOR VITRECTOMY Bilateral 2022   CYSTOSCOPY W/ RETROGRADES Bilateral 09/02/2018   Procedure: CYSTOSCOPY WITH RETROGRADE PYELOGRAM;  Surgeon: Franchot Gallo, MD;  Location[facility];  Service: Urology;  Laterality: Bilateral;   CYSTOSCOPY W/ RETROGRADES Bilateral 03/20/2020   Procedure: CYSTOSCOPY WITH BILATERAL RETROGRADE PYELOGRAM;  Surgeon: Franchot Gallo, MD;  [facility];  Service: Urology;  Laterality: Bilateral;   CYSTOSCOPY WITH BIOPSY N/A 09/02/2018   Procedure: CYSTOSCOPY WITH BIOPSY;  Surgeon: Franchot Gallo, MD;  Location: [facility];  Service: Urology;  Laterality: N/A;   ESOPHAGOGASTRODUODENOSCOPY N/A 02/21/2017   Procedure: ESOPHAGOGASTRODUODENOSCOPY (EGD);  Surgeon: Jerene Bears, MD;  Location: [facility];  Service: Gastroenterology;  Laterality: N/A;   HYDROCELE EXCISION / REPAIR     LOBECTOMY Right 03/30/2017   Procedure: RIGHT UPPER LOBECTOMY LUNG;  Surgeon: Melrose Nakayama, MD;  Location: [place];  Service: Thoracic;  Laterality: Right;   SKIN CANCER EXCISION     SPINE SURGERY     TEE WITHOUT CARDIOVERSION N/A 02/24/2017   Procedure: TRANSESOPHAGEAL ECHOCARDIOGRAM (TEE);  Surgeon: Sherren Mocha, MD;  Location: [place];  Service: Open Heart Surgery;  Laterality: N/A;   TRANSCATHETER AORTIC VALVE REPLACEMENT, TRANSFEMORAL N/A 02/24/2017   Procedure: TRANSCATHETER AORTIC VALVE REPLACEMENT, TRANSFEMORAL using a 62mm Edwards Sapien 3 Aortic Valve;  Surgeon: Sherren Mocha, MD;  Location: [place];  Service: Open Heart Surgery;  Laterality: N/A;   TRANSURETHRAL RESECTION OF BLADDER TUMOR N/A 06/06/2016   Procedure: TRANSURETHRAL RESECTION OF BLADDER TUMOR (TURBT);  Surgeon: Franchot Gallo, MD;  [facility];  Service: Urology;  Laterality: N/A;   TRANSURETHRAL RESECTION OF BLADDER TUMOR N/A 07/22/2016   Procedure: TRANSURETHRAL RESECTION OF  BLADDER TUMOR (TURBT);  Surgeon: Franchot Gallo, MD;  [facility];  Service: Urology;  Laterality: N/A;   TRANSURETHRAL RESECTION OF BLADDER TUMOR N/A 03/20/2020   Procedure: TRANSURETHRAL RESECTION OF BLADDER TUMOR (TURBT);  Surgeon: Franchot Gallo, MD;  [facility];  Service: Urology;  Laterality: N/A;   VIDEO ASSISTED THORACOSCOPY (VATS)/WEDGE RESECTION Right 03/30/2017   Procedure: VIDEO ASSISTED THORACOSCOPY (VATS)/WEDGE RESECTION;  Surgeon: Melrose Nakayama, MD;  Location: [facility];  Service: Thoracic;  Laterality: Right;    Family History  Problem Relation Age of Onset   Heart disease Father        Heart Disease before age 70   Alcohol abuse Father    Heart attack Father    Cancer Mother        Lung   COPD Mother    COPD Sister    Lupus Sister    Hypertension Brother    Gout Brother    Heart attack Son    Gout Brother    Prostatitis Brother       Controlled substance contract: n/a  Review of Systems  Constitutional:  Negative for diaphoresis.  Eyes:  Negative for pain.  Respiratory:  Negative for shortness of breath.   Cardiovascular:  Negative for chest pain, palpitations and leg swelling.  Gastrointestinal:  Negative for abdominal pain.  Endocrine: Negative for polydipsia.  Skin:  Negative for rash.  Neurological:  Negative for dizziness, weakness and headaches.  Hematological:  Does not bruise/bleed easily.  All other systems reviewed and are negative.      Objective:   Physical Exam Vitals and nursing note reviewed.  Constitutional:      Appearance: Normal appearance. He is well-developed.  HENT:     Head: Normocephalic.     Nose: Nose normal.     Mouth/Throat:     Mouth: Mucous membranes are moist.     Pharynx: Oropharynx is clear.  Eyes:     Pupils: Pupils are equal, round, and reactive to light.  Neck:     Thyroid: No thyroid mass or thyromegaly.     Vascular: No carotid bruit or JVD.     Trachea: Phonation normal.   Cardiovascular:     Rate and Rhythm: Normal rate and regular rhythm.  Pulmonary:     Effort: Pulmonary effort is normal. No respiratory distress.     Breath sounds: Normal breath sounds.  Abdominal:     General: Bowel sounds are normal.     Palpations: Abdomen is soft.     Tenderness: There is no abdominal tenderness.  Musculoskeletal:        General: Normal range of motion.     Cervical back: Normal range of motion and neck supple.  Lymphadenopathy:     Cervical: No cervical adenopathy.  Skin:    General: Skin is warm and dry.  Neurological:     Mental Status: He is alert and oriented to person, place, and time.  Psychiatric:        Behavior: Behavior normal.        Thought Content: Thought content normal.        Judgment: Judgment normal.     BP 127/72   Pulse 78   Temp 98.1 F (36.7 C) (Temporal)   Resp 20   Ht $R'5\' 9"'TX$  (1.753 m)   Wt 169 lb (76.7 kg)   SpO2 96%   BMI 24.96 kg/m        Assessment & Plan:  DEARION HUOT comes in today with chief complaint of Medical Management of Chronic Issues   Diagnosis and orders addressed:  1. Primary hypertension Low sodium diet - amLODipine (NORVASC) 10 MG tablet; Take 1 tablet (10 mg total) by mouth daily.  Dispense: 90 tablet; Refill: 1 - CBC with Differential/Platelet - CMP14+EGFR  2. Coronary artery disease of native artery of native heart with stable angina pectoris (HCC) - metoprolol succinate (TOPROL-XL) 25 MG 24 hr tablet; Take 0.5 tablets (12.5 mg total) by mouth daily.  Dispense: 45 tablet; Refill: 1  3. PVD (peripheral vascular disease) ([place]) 4. Permanent atrial fibrillation ([place]) Keep follow up with cardiology - apixaban (ELIQUIS) 5 MG TABS tablet; Take 1 tablet (5 mg total) by mouth 2 (two) times daily.  Dispense: 60 tablet; Refill: 6  5. Chronic obstructive pulmonary disease, unspecified COPD type ([place])  6. Gastroesophageal reflux disease without esophagitis Avoid spicy foods Do not eat 2 hours  prior to bedtime - pantoprazole (PROTONIX) 40 MG tablet; TAKE  (1)  TABLET TWICE A DAY.  Dispense: 180 tablet; Refill: 1  7. Hypothyroidism due to  acquired atrophy of thyroid Labs pending - levothyroxine (SYNTHROID) 88 MCG tablet; Take 1 tablet (88 mcg total) by mouth daily.  Dispense: 90 tablet; Refill: 1 - Thyroid Panel With TSH  8. Mixed hyperlipidemia Low fat diet - lisinopril (ZESTRIL) 40 MG tablet; Take 1 tablet (40 mg total) by mouth daily.  Dispense: 90 tablet; Refill: 1 - atorvastatin (LIPITOR) 40 MG tablet; Take 1 tablet (40 mg total) by mouth daily.  Dispense: 90 tablet; Refill: 1 - Lipid panel  9. Benign prostatic hyperplasia without lower urinary tract symptoms Report any voiding issues - alfuzosin (UROXATRAL) 10 MG 24 hr tablet; Take 1 tablet (10 mg total) by mouth at bedtime.  Dispense: 90 tablet; Refill: 1  10. Vitamin D deficiency Continue daily vitamin d supplement  11. BMI 26.0-26.9,adult Discussed diet and exercise for person with BMI >25 Will recheck weight in 3-6 months   12. Primary lung cancer, right [facility]) Keep follow up with oncology   Labs pending Health Maintenance reviewed Diet and exercise encouraged  Follow up plan: 6 months   [place], FNP

## 2021-12-05 NOTE — Patient Instructions (Signed)

## 2021-12-05 NOTE — Addendum Note (Signed)
Addended by: Rolena Infante on: 12/05/2021 08:47 AM   Modules accepted: Orders

## 2021-12-07 LAB — CBC WITH DIFFERENTIAL/PLATELET
Basophils Absolute: 0 10*3/uL (ref 0.0–0.2)
Basos: 1 %
EOS (ABSOLUTE): 0.2 10*3/uL (ref 0.0–0.4)
Eos: 3 %
Hematocrit: 36.2 % — ABNORMAL LOW (ref 37.5–51.0)
Hemoglobin: 12.5 g/dL — ABNORMAL LOW (ref 13.0–17.7)
Immature Grans (Abs): 0 10*3/uL (ref 0.0–0.1)
Immature Granulocytes: 0 %
Lymphocytes Absolute: 1.3 10*3/uL (ref 0.7–3.1)
Lymphs: 24 %
MCH: 32 pg (ref 26.6–33.0)
MCHC: 34.5 g/dL (ref 31.5–35.7)
MCV: 93 fL (ref 79–97)
Monocytes Absolute: 0.5 10*3/uL (ref 0.1–0.9)
Monocytes: 9 %
Neutrophils Absolute: 3.4 10*3/uL (ref 1.4–7.0)
Neutrophils: 63 %
Platelets: 131 10*3/uL — ABNORMAL LOW (ref 150–450)
RBC: 3.91 x10E6/uL — ABNORMAL LOW (ref 4.14–5.80)
RDW: 13 % (ref 11.6–15.4)
WBC: 5.3 10*3/uL (ref 3.4–10.8)

## 2021-12-07 LAB — CMP14+EGFR
ALT: 18 IU/L (ref 0–44)
AST: 31 IU/L (ref 0–40)
Albumin/Globulin Ratio: 1.4 (ref 1.2–2.2)
Albumin: 4.6 g/dL (ref 3.6–4.6)
Alkaline Phosphatase: 74 IU/L (ref 44–121)
BUN/Creatinine Ratio: 12 (ref 10–24)
BUN: 14 mg/dL (ref 8–27)
Bilirubin Total: 0.7 mg/dL (ref 0.0–1.2)
CO2: 22 mmol/L (ref 20–29)
Calcium: 9.3 mg/dL (ref 8.6–10.2)
Chloride: 105 mmol/L (ref 96–106)
Creatinine, Ser: 1.19 mg/dL (ref 0.76–1.27)
Globulin, Total: 3.2 g/dL (ref 1.5–4.5)
Glucose: 107 mg/dL — ABNORMAL HIGH (ref 70–99)
Potassium: 4 mmol/L (ref 3.5–5.2)
Sodium: 141 mmol/L (ref 134–144)
Total Protein: 7.8 g/dL (ref 6.0–8.5)
eGFR: 61 mL/min/{1.73_m2} (ref >59)

## 2021-12-07 LAB — THYROID PANEL WITH TSH
Free Thyroxine Index: 1.7 (ref 1.2–4.9)
T3 Uptake Ratio: 26 % (ref 24–39)
T4, Total: 6.6 ug/dL (ref 4.5–12.0)
TSH: 2.18 u[IU]/mL (ref 0.450–4.500)

## 2021-12-07 LAB — LIPID PANEL
Chol/HDL Ratio: 3.4 ratio (ref 0.0–5.0)
Cholesterol, Total: 96 mg/dL — ABNORMAL LOW (ref 100–199)
HDL: 28 mg/dL — ABNORMAL LOW (ref >39)
LDL Chol Calc (NIH): 50 mg/dL (ref 0–99)
Triglycerides: 88 mg/dL (ref 0–149)
VLDL Cholesterol Cal: 18 mg/dL (ref 5–40)

## 2021-12-09 ENCOUNTER — Ambulatory Visit (INDEPENDENT_AMBULATORY_CARE_PROVIDER_SITE_OTHER): Payer: Medicare Other

## 2021-12-09 VITALS — Ht 69.0 in | Wt 169.0 lb

## 2021-12-09 DIAGNOSIS — Z0000 Encounter for general adult medical examination without abnormal findings: Secondary | ICD-10-CM

## 2021-12-09 NOTE — Patient Instructions (Signed)
[name] , Thank you for taking time to come for your Medicare Wellness Visit. I appreciate your ongoing commitment to your health goals. Please review the following plan we discussed and let me know if I can assist you in the future.   Screening recommendations/referrals: Colonoscopy: No longer required due to age.  Recommended yearly ophthalmology/optometry visit for glaucoma screening and checkup Recommended yearly dental visit for hygiene and checkup  Vaccinations: Influenza vaccine: Done 03/18/2021 Repeat annually  Pneumococcal vaccine: Done 04/26/2013, 12/11/14 and 06/23/2004. Tdap vaccine: Done 12/05/2021. Repeat in 10 years  Shingles vaccine: Done 09/14/2020 and 07/04/2020.   Covid-19: Done 07/02/2021, 02/19/2021, 04/24/2020, 08/30/2019 AND 08/02/2019.  Advanced directives: Please bring a copy of your health care power of attorney and living will to the office to be added to your chart at your convenience.   Conditions/risks identified: KEEP UP THE GOOD WORK!!  Next appointment: Follow up in one year for your annual wellness visit. 2024.  Preventive Care 4 Years and Older, Male  Preventive care refers to lifestyle choices and visits with your health care provider that can promote health and wellness. What does preventive care include? A yearly physical exam. This is also called an annual well check. Dental exams once or twice a year. Routine eye exams. Ask your health care provider how often you should have your eyes checked. Personal lifestyle choices, including: Daily care of your teeth and gums. Regular physical activity. Eating a healthy diet. Avoiding tobacco and drug use. Limiting alcohol use. Practicing safe sex. Taking low doses of aspirin every day. Taking vitamin and mineral supplements as recommended by your health care provider. What happens during an annual well check? The services and screenings done by your health care provider during your annual well check will  depend on your age, overall health, lifestyle risk factors, and family history of disease. Counseling  Your health care provider may ask you questions about your: Alcohol use. Tobacco use. Drug use. Emotional well-being. Home and relationship well-being. Sexual activity. Eating habits. History of falls. Memory and ability to understand (cognition). Work and work Statistician. Screening  You may have the following tests or measurements: Height, weight, and BMI. Blood pressure. Lipid and cholesterol levels. These may be checked every 5 years, or more frequently if you are over 72 years old. Skin check. Lung cancer screening. You may have this screening every year starting at age 38 if you have a 30-pack-year history of smoking and currently smoke or have quit within the past 15 years. Fecal occult blood test (FOBT) of the stool. You may have this test every year starting at age 37. Flexible sigmoidoscopy or colonoscopy. You may have a sigmoidoscopy every 5 years or a colonoscopy every 10 years starting at age 80. Prostate cancer screening. Recommendations will vary depending on your family history and other risks. Hepatitis C blood test. Hepatitis B blood test. Sexually transmitted disease (STD) testing. Diabetes screening. This is done by checking your blood sugar (glucose) after you have not eaten for a while (fasting). You may have this done every 1-3 years. Abdominal aortic aneurysm (AAA) screening. You may need this if you are a current or former smoker. Osteoporosis. You may be screened starting at age 40 if you are at high risk. Talk with your health care provider about your test results, treatment options, and if necessary, the need for more tests. Vaccines  Your health care provider may recommend certain vaccines, such as: Influenza vaccine. This is recommended every year. Tetanus,  diphtheria, and acellular pertussis (Tdap, Td) vaccine. You may need a Td booster every 10  years. Zoster vaccine. You may need this after age 71. Pneumococcal 13-valent conjugate (PCV13) vaccine. One dose is recommended after age 93. Pneumococcal polysaccharide (PPSV23) vaccine. One dose is recommended after age 73. Talk to your health care provider about which screenings and vaccines you need and how often you need them. This information is not intended to replace advice given to you by your health care provider. Make sure you discuss any questions you have with your health care provider. Document Released: 07/06/2015 Document Revised: 02/27/2016 Document Reviewed: 04/10/2015 Elsevier Interactive Patient Education  2017 [place] Prevention in the Home Falls can cause injuries. They can happen to people of all ages. There are many things you can do to make your home safe and to help prevent falls. What can I do on the outside of my home? Regularly fix the edges of walkways and driveways and fix any cracks. Remove anything that might make you trip as you walk through a door, such as a raised step or threshold. Trim any bushes or trees on the path to your home. Use bright outdoor lighting. Clear any walking paths of anything that might make someone trip, such as rocks or tools. Regularly check to see if handrails are loose or broken. Make sure that both sides of any steps have handrails. Any raised decks and porches should have guardrails on the edges. Have any leaves, snow, or ice cleared regularly. Use sand or salt on walking paths during winter. Clean up any spills in your garage right away. This includes oil or grease spills. What can I do in the bathroom? Use night lights. Install grab bars by the toilet and in the tub and shower. Do not use towel bars as grab bars. Use non-skid mats or decals in the tub or shower. If you need to sit down in the shower, use a plastic, non-slip stool. Keep the floor dry. Clean up any water that spills on the floor as soon as it  happens. Remove soap buildup in the tub or shower regularly. Attach bath mats securely with double-sided non-slip rug tape. Do not have throw rugs and other things on the floor that can make you trip. What can I do in the bedroom? Use night lights. Make sure that you have a light by your bed that is easy to reach. Do not use any sheets or blankets that are too big for your bed. They should not hang down onto the floor. Have a firm chair that has side arms. You can use this for support while you get dressed. Do not have throw rugs and other things on the floor that can make you trip. What can I do in the kitchen? Clean up any spills right away. Avoid walking on wet floors. Keep items that you use a lot in easy-to-reach places. If you need to reach something above you, use a strong step stool that has a grab bar. Keep electrical cords out of the way. Do not use floor polish or wax that makes floors slippery. If you must use wax, use non-skid floor wax. Do not have throw rugs and other things on the floor that can make you trip. What can I do with my stairs? Do not leave any items on the stairs. Make sure that there are handrails on both sides of the stairs and use them. Fix handrails that are broken or loose. Make  sure that handrails are as long as the stairways. Check any carpeting to make sure that it is firmly attached to the stairs. Fix any carpet that is loose or worn. Avoid having throw rugs at the top or bottom of the stairs. If you do have throw rugs, attach them to the floor with carpet tape. Make sure that you have a light switch at the top of the stairs and the bottom of the stairs. If you do not have them, ask someone to add them for you. What else can I do to help prevent falls? Wear shoes that: Do not have high heels. Have rubber bottoms. Are comfortable and fit you well. Are closed at the toe. Do not wear sandals. If you use a stepladder: Make sure that it is fully opened.  Do not climb a closed stepladder. Make sure that both sides of the stepladder are locked into place. Ask someone to hold it for you, if possible. Clearly mark and make sure that you can see: Any grab bars or handrails. First and last steps. Where the edge of each step is. Use tools that help you move around (mobility aids) if they are needed. These include: Canes. Walkers. Scooters. Crutches. Turn on the lights when you go into a dark area. Replace any light bulbs as soon as they burn out. Set up your furniture so you have a clear path. Avoid moving your furniture around. If any of your floors are uneven, fix them. If there are any pets around you, be aware of where they are. Review your medicines with your doctor. Some medicines can make you feel dizzy. This can increase your chance of falling. Ask your doctor what other things that you can do to help prevent falls. This information is not intended to replace advice given to you by your health care provider. Make sure you discuss any questions you have with your health care provider. Document Released: 04/05/2009 Document Revised: 11/15/2015 Document Reviewed: 07/14/2014 Elsevier Interactive Patient Education  2017 [employer].

## 2021-12-09 NOTE — Progress Notes (Signed)
Subjective:   [name] [name] is a 81 y.o. male who presents for Medicare Annual/Subsequent preventive examination.  Review of Systems     Cardiac Risk Factors include: advanced age (>38men, >32 women);hypertension;dyslipidemia;male gender;sedentary lifestyle     Objective:    Today's Vitals   12/09/21 1115  Weight: 169 lb (76.7 kg)  Height: 5\' 9"  (1.753 m)   Body mass index is 24.96 kg/m.     12/09/2021   11:20 AM 04/18/2021    9:54 AM 12/06/2020    1:45 PM 03/21/2020   10:18 AM 03/21/2020    8:23 AM 03/20/2020    7:06 AM 03/16/2020    8:29 AM  Advanced Directives  Does Patient Have a Medical Advance Directive? Yes Yes Yes No Yes Yes Yes  Type of Paramedic of [place];Living will Living will [name];Living will  [place]  Does patient want to make changes to medical advance directive?    No - Patient declined No - Patient declined No - Patient declined   Copy of [place] in Chart? No - copy requested  No - copy requested  No - copy requested  No - copy requested    Current Medications (verified) Outpatient Encounter Medications as of 12/09/2021  Medication Sig   alfuzosin (UROXATRAL) 10 MG 24 hr tablet Take 1 tablet (10 mg total) by mouth at bedtime.   amLODipine (NORVASC) 10 MG tablet Take 1 tablet (10 mg total) by mouth daily.   apixaban (ELIQUIS) 5 MG TABS tablet Take 1 tablet (5 mg total) by mouth 2 (two) times daily.   atorvastatin (LIPITOR) 40 MG tablet Take 1 tablet (40 mg total) by mouth daily.   cholecalciferol (VITAMIN D) 1000 UNITS tablet Take 1 tablet (1,000 Units total) by mouth daily.   ferrous gluconate (FERGON) 324 MG tablet Take 1 tablet (324 mg total) by mouth 2 (two) times daily with a meal.   levothyroxine (SYNTHROID) 88 MCG tablet Take 1 tablet (88 mcg total) by mouth daily.    lisinopril (ZESTRIL) 40 MG tablet Take 1 tablet (40 mg total) by mouth daily.   metoprolol succinate (TOPROL-XL) 25 MG 24 hr tablet Take 0.5 tablets (12.5 mg total) by mouth daily.   Multiple Vitamin (MULTIVITAMIN WITH MINERALS) TABS tablet Take 1 tablet by mouth daily. One A Day Multivitamin for Men   Omega-3 Fatty Acids (FISH OIL) 1200 MG CAPS Take 2,400 mg by mouth daily.    pantoprazole (PROTONIX) 40 MG tablet TAKE  (1)  TABLET TWICE A DAY.   No facility-administered encounter medications on file as of 12/09/2021.    Allergies (verified) Lovenox [enoxaparin]   History: Past Medical History:  Diagnosis Date   AAA (abdominal aortic aneurysm) ([place])    Aortic stenosis, severe    a. 02/2017: s/p TAVR with Edwards Sapien 3 THV (size 26 mm, model # 9600TFX, serial # [id number])   Arthritis    Atherosclerotic peripheral vascular disease ([place])    Atrial fibrillation, persistent (HCC)    a. not a candidate for [facility] given hx of GI bleed   Bilateral carotid artery stenosis    Bladder cancer (HCC)    COPD (chronic obstructive pulmonary disease) ([place])    Gastroesophageal reflux disease without esophagitis 05/25/2019   Hyperlipidemia    Hypertension    Hypothyroidism    Lung cancer ([place])    Right   Lung mass  Right   Skin cancer    Vitamin D deficiency    Past Surgical History:  Procedure Laterality Date   ABDOMINAL AORTIC ANEURYSM REPAIR  08/09/2009   BACK SURGERY     x2   CATARACT EXTRACTION BILATERAL W/ ANTERIOR VITRECTOMY Bilateral 2022   CYSTOSCOPY W/ RETROGRADES Bilateral 09/02/2018   Procedure: CYSTOSCOPY WITH RETROGRADE PYELOGRAM;  Surgeon: Franchot Gallo, MD;  Location: [facility];  Service: Urology;  Laterality: Bilateral;   CYSTOSCOPY W/ RETROGRADES Bilateral 03/20/2020   Procedure: CYSTOSCOPY WITH BILATERAL RETROGRADE PYELOGRAM;  Surgeon: Franchot Gallo, MD;  [facility];  Service: Urology;  Laterality: Bilateral;   CYSTOSCOPY WITH BIOPSY N/A  09/02/2018   Procedure: CYSTOSCOPY WITH BIOPSY;  Surgeon: Franchot Gallo, MD;  Location: [facility];  Service: Urology;  Laterality: N/A;   ESOPHAGOGASTRODUODENOSCOPY N/A 02/21/2017   Procedure: ESOPHAGOGASTRODUODENOSCOPY (EGD);  Surgeon: Jerene Bears, MD;  Location: [facility] ENDOSCOPY;  Service: Gastroenterology;  Laterality: N/A;   HYDROCELE EXCISION / REPAIR     LOBECTOMY Right 03/30/2017   Procedure: RIGHT UPPER LOBECTOMY LUNG;  Surgeon: Melrose Nakayama, MD;  Location: [place];  Service: Thoracic;  Laterality: Right;   SKIN CANCER EXCISION     SPINE SURGERY     TEE WITHOUT CARDIOVERSION N/A 02/24/2017   Procedure: TRANSESOPHAGEAL ECHOCARDIOGRAM (TEE);  Surgeon: Sherren Mocha, MD;  Location: [place];  Service: Open Heart Surgery;  Laterality: N/A;   TRANSCATHETER AORTIC VALVE REPLACEMENT, TRANSFEMORAL N/A 02/24/2017   Procedure: TRANSCATHETER AORTIC VALVE REPLACEMENT, TRANSFEMORAL using a 47mm Edwards Sapien 3 Aortic Valve;  Surgeon: Sherren Mocha, MD;  Location: [place];  Service: Open Heart Surgery;  Laterality: N/A;   TRANSURETHRAL RESECTION OF BLADDER TUMOR N/A 06/06/2016   Procedure: TRANSURETHRAL RESECTION OF BLADDER TUMOR (TURBT);  Surgeon: Franchot Gallo, MD;  [facility];  Service: Urology;  Laterality: N/A;   TRANSURETHRAL RESECTION OF BLADDER TUMOR N/A 07/22/2016   Procedure: TRANSURETHRAL RESECTION OF BLADDER TUMOR (TURBT);  Surgeon: Franchot Gallo, MD;  [facility];  Service: Urology;  Laterality: N/A;   TRANSURETHRAL RESECTION OF BLADDER TUMOR N/A 03/20/2020   Procedure: TRANSURETHRAL RESECTION OF BLADDER TUMOR (TURBT);  Surgeon: Franchot Gallo, MD;  [facility];  Service: Urology;  Laterality: N/A;   VIDEO ASSISTED THORACOSCOPY (VATS)/WEDGE RESECTION Right 03/30/2017   Procedure: VIDEO ASSISTED THORACOSCOPY (VATS)/WEDGE RESECTION;  Surgeon: Melrose Nakayama, MD;  Location: [place] [facility];  Service: Thoracic;  Laterality: Right;   Family  History  Problem Relation Age of Onset   Heart disease Father        Heart Disease before age 78   Alcohol abuse Father    Heart attack Father    Cancer Mother        Lung   COPD Mother    COPD Sister    Lupus Sister    Hypertension Brother    Gout Brother    Heart attack Son    Gout Brother    Prostatitis Brother    Social History   Socioeconomic History   Marital status: Married    Spouse name: Jw Covin   Number of children: 2   Years of education: Not on file   Highest education level: Not on file  Occupational History   Occupation: Retired    Comment: Librarian, academic with [employer]  Tobacco Use   Smoking status: Former    Packs/day: 1.00    Years: 51.00    Total pack years: 51.00    Types: Cigarettes  Start date: 11/25/1954    Quit date: 10/27/2005    Years since quitting: 16.1   Smokeless tobacco: Never  Vaping Use   Vaping Use: Never used  Substance and Sexual Activity   Alcohol use: No    Alcohol/week: 0.0 standard drinks of alcohol    Comment: none since 1999   Drug use: No   Sexual activity: Yes  Other Topics Concern   Not on file  Social History Narrative   2 sons that are deceased. 4 grandchildren and 3 great grandchildren. Lives at home with his wife. He stays active, plays golf, does yard work. Goes to [employer].   Social Determinants of Health   Financial Resource Strain: Low Risk  (12/09/2021)   Overall Financial Resource Strain (CARDIA)    Difficulty of Paying Living Expenses: Not hard at all  Food Insecurity: No Food Insecurity (12/09/2021)   Hunger Vital Sign    Worried About Running Out of Food in the Last Year: Never true    Ran Out of Food in the Last Year: Never true  Transportation Needs: No Transportation Needs (12/09/2021)   PRAPARE - Hydrologist (Medical): No    Lack of Transportation (Non-Medical): No  Physical Activity: Sufficiently Active (12/09/2021)   Exercise Vital Sign    Days  of Exercise per Week: 7 days    Minutes of Exercise per Session: 30 min  Stress: No Stress Concern Present (12/09/2021)   Gays    Feeling of Stress : Not at all  Social Connections: [place] (12/09/2021)   Social Connection and Isolation Panel [NHANES]    Frequency of Communication with Friends and Family: More than three times a week    Frequency of Social Gatherings with Friends and Family: More than three times a week    Attends Religious Services: More than 4 times per year    Active Member of [employer] or Organizations: Yes    Attends Music therapist: More than 4 times per year    Marital Status: Married    Tobacco Counseling Counseling given: Not Answered   Clinical Intake:  Pre-visit preparation completed: Yes  Pain : No/denies pain     BMI - recorded: 24.96 Nutritional Status: BMI of 19-24  Normal Nutritional Risks: None Diabetes: No  How often do you need to have someone help you when you read instructions, pamphlets, or other written materials from your doctor or pharmacy?: 1 - Never  Diabetic?NO  Interpreter Needed?: No  Information entered by :: mj Shrey Boike,lpn   Activities of Daily Living    12/09/2021   11:21 AM  In your present state of health, do you have any difficulty performing the following activities:  Hearing? 0  Vision? 0  Difficulty concentrating or making decisions? 0  Walking or climbing stairs? 0  Dressing or bathing? 0  Doing errands, shopping? 0  Preparing Food and eating ? N  Using the Toilet? N  In the past six months, have you accidently leaked urine? N  Do you have problems with loss of bowel control? N  Managing your Medications? N  Managing your Finances? N  Housekeeping or managing your Housekeeping? N    Patient Care Team: [name], FNP as PCP - General (Family Medicine) [name], FNP as Nurse Practitioner  (Nurse Practitioner) Allyn Kenner, MD (Dermatology) Serafina Mitchell, MD as Consulting Physician (Vascular Surgery) Sherren Mocha, MD as Consulting Physician (  Cardiology) Franchot Gallo, MD as Consulting Physician (Urology) Melrose Nakayama, MD as Consulting Physician (Cardiothoracic Surgery)  Indicate any recent Medical Services you may have received from other than [facility] providers in the past year (date may be approximate).     Assessment:   This is a routine wellness examination for B[place].  Hearing/Vision screen Hearing Screening - Comments:: No hearing issues.  Vision Screening - Comments:: Glasses. [facility] 07/2021.  Dietary issues and exercise activities discussed: Current Exercise Habits: Home exercise routine, Type of exercise: walking, Time (Minutes): 30, Frequency (Times/Week): 7, Weekly Exercise (Minutes/Week): 210, Intensity: Mild, Exercise limited by: cardiac condition(s);respiratory conditions(s)   Goals Addressed             This Visit's Progress    Exercise 3x per week (30 min per time)   On track      Depression Screen    12/09/2021   11:17 AM 12/05/2021    7:50 AM 06/06/2021   10:00 AM 12/06/2020    1:36 PM 12/04/2020   10:58 AM 05/28/2020    9:47 AM 11/24/2019    8:08 AM  PHQ 2/9 Scores  PHQ - 2 Score 0 0 0 0 0 0 0  PHQ- 9 Score 0 0 1 0 2      Fall Risk    12/09/2021   11:20 AM 12/05/2021    7:50 AM 06/06/2021   10:00 AM 12/06/2020    1:43 PM 12/04/2020   10:57 AM  Fall Risk   Falls in the past year? 0 0 0 0 0  Number falls in past yr: 0   0   Injury with Fall? 0   0   Risk for fall due to : No Fall Risks   Impaired vision   Follow up Falls prevention discussed   Falls prevention discussed     FALL RISK PREVENTION PERTAINING TO THE HOME:  Any stairs in or around the home? Yes  If so, are there any without handrails? No  Home free of loose throw rugs in walkways, pet beds, electrical cords, etc? Yes  Adequate lighting in  your home to reduce risk of falls? Yes   ASSISTIVE DEVICES UTILIZED TO PREVENT FALLS:  Life alert? No  Use of a cane, walker or w/c? No  Grab bars in the bathroom? Yes  Shower chair or bench in shower? Yes  Elevated toilet seat or a handicapped toilet? Yes   TIMED UP AND GO:  Was the test performed? No .  PHONE VISIT.  Cognitive Function:    06/10/2018   10:22 AM 05/27/2017    8:58 AM 05/26/2016    9:55 AM 12/05/2014    9:13 AM  MMSE - Mini Mental State Exam  Orientation to time 5 5 5 5   Orientation to Place 5 5 5 5   Registration 3 3 3 3   Attention/ Calculation 5 5 5 5   Recall 3 2 3 2   Language- name 2 objects 2 2 2 2   Language- repeat 1 1 1 1   Language- follow 3 step command 3 3 3 3   Language- read & follow direction 1 1 1 1   Write a sentence 1 1 1 1   Copy design 1 1 1 1   Total score 30 29 30 29         12/09/2021   11:22 AM 12/06/2020    1:45 PM  6CIT Screen  What Year? 0 points 0 points  What month? 0 points 0 points  What  time? 0 points 0 points  Count back from 20 0 points 0 points  Months in reverse 2 points 0 points  Repeat phrase 0 points 0 points  Total Score 2 points 0 points    Immunizations Immunization History  Administered Date(s) Administered   Fluad Quad(high Dose 65+) 03/08/2019, 03/08/2019, 03/18/2021   Influenza Inj Mdck Quad With Preservative 03/18/2018   Influenza Split 03/23/2013   Influenza, High Dose Seasonal PF 03/06/2014   Influenza,inj,Quad PF,6+ Mos 03/22/2015, 03/18/2017   Influenza-Unspecified 03/22/2016, 03/18/2018, 03/02/2019, 03/27/2020   Moderna Covid-19 Vaccine Bivalent Booster 73yrs & up 07/02/2021   Moderna Sars-Covid-2 Vaccination 08/02/2019, 08/30/2019, 04/24/2020, 02/19/2021   Pneumococcal Conjugate-13 04/26/2013   Pneumococcal Polysaccharide-23 06/23/2004, 12/11/2014   Td 03/24/2007   Tdap 12/05/2021   Zoster Recombinat (Shingrix) 07/04/2020, 09/14/2020    TDAP status: Up to date  Flu Vaccine status: Up to  date  Pneumococcal vaccine status: Up to date  Covid-19 vaccine status: Completed vaccines  Qualifies for Shingles Vaccine? Yes   Zostavax completed Yes   Shingrix Completed?: Yes  Screening Tests Health Maintenance  Topic Date Due   INFLUENZA VACCINE  01/21/2022   TETANUS/TDAP  12/06/2031   Pneumonia Vaccine 94+ Years old  Completed   COVID-19 Vaccine  Completed   Zoster Vaccines- Shingrix  Completed   HPV VACCINES  Aged Out    Health Maintenance  There are no preventive care reminders to display for this patient.  Colorectal cancer screening: No longer required.   Lung Cancer Screening: (Low Dose CT Chest recommended if Age 10-80 years, 30 pack-year currently smoking OR have quit w/in 15years.) does not qualify.  Additional Screening:  Hepatitis C Screening: does not qualify.  Vision Screening: Recommended annual ophthalmology exams for early detection of glaucoma and other disorders of the eye. Is the patient up to date with their annual eye exam?  Yes  Who is the provider or what is the name of the office in which the patient attends annual eye exams? [facility] If pt is not established with a provider, would they like to be referred to a provider to establish care? No .   Dental Screening: Recommended annual dental exams for proper oral hygiene  Community Resource Referral / Chronic Care Management: CRR required this visit?  No   CCM required this visit?  No      Plan:     I have personally reviewed and noted the following in the patient's chart:   Medical and social history Use of alcohol, tobacco or illicit drugs  Current medications and supplements including opioid prescriptions. Patient is not currently taking opioid prescriptions. Functional ability and status Nutritional status Physical activity Advanced directives List of other physicians Hospitalizations, surgeries, and ER visits in previous 12 months Vitals Screenings to include  cognitive, depression, and falls Referrals and appointments  In addition, I have reviewed and discussed with patient certain preventive protocols, quality metrics, and best practice recommendations. A written personalized care plan for preventive services as well as general preventive health recommendations were provided to patient.     Chriss Driver, LPN   10/04/2438   Nurse Notes: Pt is up to date on health maintenance and vaccines.      Subjective:   [name] [name] is a 81 y.o. male who presents for Medicare Annual/Subsequent preventive examination.  Review of Systems     Cardiac Risk Factors include: advanced age (>51men, >67 women);hypertension;dyslipidemia;male gender;sedentary lifestyle     Objective:  Today's Vitals   12/09/21 1115  Weight: 169 lb (76.7 kg)  Height: 5\' 9"  (1.753 m)   Body mass index is 24.96 kg/m.     12/09/2021   11:20 AM 04/18/2021    9:54 AM 12/06/2020    1:45 PM 03/21/2020   10:18 AM 03/21/2020    8:23 AM 03/20/2020    7:06 AM 03/16/2020    8:29 AM  Advanced Directives  Does Patient Have a Medical Advance Directive? Yes Yes Yes No Yes Yes Yes  Type of Paramedic of [place];Living will Living will [place];Living will  [place]  Does patient want to make changes to medical advance directive?    No - Patient declined No - Patient declined No - Patient declined   Copy of [place] in Chart? No - copy requested  No - copy requested  No - copy requested  No - copy requested    Current Medications (verified) Outpatient Encounter Medications as of 12/09/2021  Medication Sig   alfuzosin (UROXATRAL) 10 MG 24 hr tablet Take 1 tablet (10 mg total) by mouth at bedtime.   amLODipine (NORVASC) 10 MG tablet Take 1 tablet (10 mg total) by mouth daily.   apixaban (ELIQUIS) 5 MG TABS  tablet Take 1 tablet (5 mg total) by mouth 2 (two) times daily.   atorvastatin (LIPITOR) 40 MG tablet Take 1 tablet (40 mg total) by mouth daily.   cholecalciferol (VITAMIN D) 1000 UNITS tablet Take 1 tablet (1,000 Units total) by mouth daily.   ferrous gluconate (FERGON) 324 MG tablet Take 1 tablet (324 mg total) by mouth 2 (two) times daily with a meal.   levothyroxine (SYNTHROID) 88 MCG tablet Take 1 tablet (88 mcg total) by mouth daily.   lisinopril (ZESTRIL) 40 MG tablet Take 1 tablet (40 mg total) by mouth daily.   metoprolol succinate (TOPROL-XL) 25 MG 24 hr tablet Take 0.5 tablets (12.5 mg total) by mouth daily.   Multiple Vitamin (MULTIVITAMIN WITH MINERALS) TABS tablet Take 1 tablet by mouth daily. One A Day Multivitamin for Men   Omega-3 Fatty Acids (FISH OIL) 1200 MG CAPS Take 2,400 mg by mouth daily.    pantoprazole (PROTONIX) 40 MG tablet TAKE  (1)  TABLET TWICE A DAY.   No facility-administered encounter medications on file as of 12/09/2021.    Allergies (verified) Lovenox [enoxaparin]   History: Past Medical History:  Diagnosis Date   AAA (abdominal aortic aneurysm) ([place])    Aortic stenosis, severe    a. 02/2017: s/p TAVR with Edwards Sapien 3 THV (size 26 mm, model # 9600TFX, serial # [id number])   Arthritis    Atherosclerotic peripheral vascular disease ([place])    Atrial fibrillation, persistent (HCC)    a. not a candidate for [facility] given hx of GI bleed   Bilateral carotid artery stenosis    Bladder cancer (HCC)    COPD (chronic obstructive pulmonary disease) ([place])    Gastroesophageal reflux disease without esophagitis 05/25/2019   Hyperlipidemia    Hypertension    Hypothyroidism    Lung cancer ([place])    Right   Lung mass    Right   Skin cancer    Vitamin D deficiency    Past Surgical History:  Procedure Laterality Date   ABDOMINAL AORTIC ANEURYSM REPAIR  08/09/2009   BACK SURGERY     x2   CATARACT EXTRACTION BILATERAL  W/ ANTERIOR VITRECTOMY Bilateral 2022    CYSTOSCOPY W/ RETROGRADES Bilateral 09/02/2018   Procedure: CYSTOSCOPY WITH RETROGRADE PYELOGRAM;  Surgeon: Franchot Gallo, MD;  [facility];  Service: Urology;  Laterality: Bilateral;   CYSTOSCOPY W/ RETROGRADES Bilateral 03/20/2020   Procedure: CYSTOSCOPY WITH BILATERAL RETROGRADE PYELOGRAM;  Surgeon: Franchot Gallo, MD;  [facility];  Service: Urology;  Laterality: Bilateral;   CYSTOSCOPY WITH BIOPSY N/A 09/02/2018   Procedure: CYSTOSCOPY WITH BIOPSY;  Surgeon: Franchot Gallo, MD;  Location: [facility];  Service: Urology;  Laterality: N/A;   ESOPHAGOGASTRODUODENOSCOPY N/A 02/21/2017   Procedure: ESOPHAGOGASTRODUODENOSCOPY (EGD);  Surgeon: Jerene Bears, MD;  Location[facility];  Service: Gastroenterology;  Laterality: N/A;   HYDROCELE EXCISION / REPAIR     LOBECTOMY Right 03/30/2017   Procedure: RIGHT UPPER LOBECTOMY LUNG;  Surgeon: Melrose Nakayama, MD;  Location: [place];  Service: Thoracic;  Laterality: Right;   SKIN CANCER EXCISION     SPINE SURGERY     TEE WITHOUT CARDIOVERSION N/A 02/24/2017   Procedure: TRANSESOPHAGEAL ECHOCARDIOGRAM (TEE);  Surgeon: Sherren Mocha, MD;  Location: [place];  Service: Open Heart Surgery;  Laterality: N/A;   TRANSCATHETER AORTIC VALVE REPLACEMENT, TRANSFEMORAL N/A 02/24/2017   Procedure: TRANSCATHETER AORTIC VALVE REPLACEMENT, TRANSFEMORAL using a 37mm Edwards Sapien 3 Aortic Valve;  Surgeon: Sherren Mocha, MD;  Location: [place];  Service: Open Heart Surgery;  Laterality: N/A;   TRANSURETHRAL RESECTION OF BLADDER TUMOR N/A 06/06/2016   Procedure: TRANSURETHRAL RESECTION OF BLADDER TUMOR (TURBT);  Surgeon: Franchot Gallo, MD;  [facility];  Service: Urology;  Laterality: N/A;   TRANSURETHRAL RESECTION OF BLADDER TUMOR N/A 07/22/2016   Procedure: TRANSURETHRAL RESECTION OF BLADDER TUMOR (TURBT);  Surgeon: Franchot Gallo, MD;  [facility];  Service: Urology;  Laterality: N/A;    TRANSURETHRAL RESECTION OF BLADDER TUMOR N/A 03/20/2020   Procedure: TRANSURETHRAL RESECTION OF BLADDER TUMOR (TURBT);  Surgeon: Franchot Gallo, MD;  [facility];  Service: Urology;  Laterality: N/A;   VIDEO ASSISTED THORACOSCOPY (VATS)/WEDGE RESECTION Right 03/30/2017   Procedure: VIDEO ASSISTED THORACOSCOPY (VATS)/WEDGE RESECTION;  Surgeon: Melrose Nakayama, MD;  [facility];  Service: Thoracic;  Laterality: Right;   Family History  Problem Relation Age of Onset   Heart disease Father        Heart Disease before age 46   Alcohol abuse Father    Heart attack Father    Cancer Mother        Lung   COPD Mother    COPD Sister    Lupus Sister    Hypertension Brother    Gout Brother    Heart attack Son    Gout Brother    Prostatitis Brother    Social History   Socioeconomic History   Marital status: Married    Spouse name: Lavone Weisel   Number of children: 2   Years of education: Not on file   Highest education level: Not on file  Occupational History   Occupation: Retired    Comment: Librarian, academic with [employer]  Tobacco Use   Smoking status: Former    Packs/day: 1.00    Years: 51.00    Total pack years: 51.00    Types: Cigarettes    Start date: 11/25/1954    Quit date: 10/27/2005    Years since quitting: 16.1   Smokeless tobacco: Never  Vaping Use   Vaping Use: Never used  Substance and Sexual Activity   Alcohol use: No  Alcohol/week: 0.0 standard drinks of alcohol    Comment: none since 1999   Drug use: No   Sexual activity: Yes  Other Topics Concern   Not on file  Social History Narrative   2 sons that are deceased. 4 grandchildren and 3 great grandchildren. Lives at home with his wife. He stays active, plays golf, does yard work. Goes to [employer].   Social Determinants of Health   Financial Resource Strain: Low Risk  (12/09/2021)   Overall Financial Resource Strain (CARDIA)    Difficulty of Paying Living Expenses: Not hard  at all  Food Insecurity: No Food Insecurity (12/09/2021)   Hunger Vital Sign    Worried About Running Out of Food in the Last Year: Never true    Ran Out of Food in the Last Year: Never true  Transportation Needs: No Transportation Needs (12/09/2021)   PRAPARE - Hydrologist (Medical): No    Lack of Transportation (Non-Medical): No  Physical Activity: Sufficiently Active (12/09/2021)   Exercise Vital Sign    Days of Exercise per Week: 7 days    Minutes of Exercise per Session: 30 min  Stress: No Stress Concern Present (12/09/2021)   [place]    Feeling of Stress : Not at all  Social Connections: [place] (12/09/2021)   Social Connection and Isolation Panel [NHANES]    Frequency of Communication with Friends and Family: More than three times a week    Frequency of Social Gatherings with Friends and Family: More than three times a week    Attends Religious Services: More than 4 times per year    Active Member of [employer] or Organizations: Yes    Attends Music therapist: More than 4 times per year    Marital Status: Married    Tobacco Counseling Counseling given: Not Answered   Clinical Intake:  Pre-visit preparation completed: Yes  Pain : No/denies pain     BMI - recorded: 24.96 Nutritional Status: BMI of 19-24  Normal Nutritional Risks: None Diabetes: No  How often do you need to have someone help you when you read instructions, pamphlets, or other written materials from your doctor or pharmacy?: 1 - Never  Diabetic?NO  Interpreter Needed?: No  Information entered by :: mj Tyrian Peart,lpn   Activities of Daily Living    12/09/2021   11:21 AM  In your present state of health, do you have any difficulty performing the following activities:  Hearing? 0  Vision? 0  Difficulty concentrating or making decisions? 0  Walking or climbing stairs? 0  Dressing or  bathing? 0  Doing errands, shopping? 0  Preparing Food and eating ? N  Using the Toilet? N  In the past six months, have you accidently leaked urine? N  Do you have problems with loss of bowel control? N  Managing your Medications? N  Managing your Finances? N  Housekeeping or managing your Housekeeping? N    Patient Care Team: [name], FNP as PCP - General (Family Medicine) [name], FNP as Nurse Practitioner (Nurse Practitioner) Allyn Kenner, MD (Dermatology) Serafina Mitchell, MD as Consulting Physician (Vascular Surgery) Sherren Mocha, MD as Consulting Physician (Cardiology) Franchot Gallo, MD as Consulting Physician (Urology) Melrose Nakayama, MD as Consulting Physician (Cardiothoracic Surgery)  Indicate any recent Medical Services you may have received from other than [facility] providers in the past year (date may be approximate).  Assessment:   This is a routine wellness examination for [place].  Hearing/Vision screen Hearing Screening - Comments:: No hearing issues.  Vision Screening - Comments:: Glasses. [facility] 07/2021.  Dietary issues and exercise activities discussed: Current Exercise Habits: Home exercise routine, Type of exercise: walking, Time (Minutes): 30, Frequency (Times/Week): 7, Weekly Exercise (Minutes/Week): 210, Intensity: Mild, Exercise limited by: cardiac condition(s);respiratory conditions(s)   Goals Addressed             This Visit's Progress    Exercise 3x per week (30 min per time)   On track     Depression Screen    12/09/2021   11:17 AM 12/05/2021    7:50 AM 06/06/2021   10:00 AM 12/06/2020    1:36 PM 12/04/2020   10:58 AM 05/28/2020    9:47 AM 11/24/2019    8:08 AM  PHQ 2/9 Scores  PHQ - 2 Score 0 0 0 0 0 0 0  PHQ- 9 Score 0 0 1 0 2      Fall Risk    12/09/2021   11:20 AM 12/05/2021    7:50 AM 06/06/2021   10:00 AM 12/06/2020    1:43 PM 12/04/2020   10:57 AM  Fall Risk   Falls in the  past year? 0 0 0 0 0  Number falls in past yr: 0   0   Injury with Fall? 0   0   Risk for fall due to : No Fall Risks   Impaired vision   Follow up Falls prevention discussed   Falls prevention discussed     FALL RISK PREVENTION PERTAINING TO THE HOME:  Any stairs in or around the home? Yes  If so, are there any without handrails? No  Home free of loose throw rugs in walkways, pet beds, electrical cords, etc? Yes  Adequate lighting in your home to reduce risk of falls? Yes   ASSISTIVE DEVICES UTILIZED TO PREVENT FALLS:  Life alert? No  Use of a cane, walker or w/c? No  Grab bars in the bathroom? Yes  Shower chair or bench in shower? Yes  Elevated toilet seat or a handicapped toilet? Yes   TIMED UP AND GO:  Was the test performed? No .  PHONE VISIT.  Cognitive Function:    06/10/2018   10:22 AM 05/27/2017    8:58 AM 05/26/2016    9:55 AM 12/05/2014    9:13 AM  MMSE - Mini Mental State Exam  Orientation to time 5 5 5 5   Orientation to Place 5 5 5 5   Registration 3 3 3 3   Attention/ Calculation 5 5 5 5   Recall 3 2 3 2   Language- name 2 objects 2 2 2 2   Language- repeat 1 1 1 1   Language- follow 3 step command 3 3 3 3   Language- read & follow direction 1 1 1 1   Write a sentence 1 1 1 1   Copy design 1 1 1 1   Total score 30 29 30 29         12/09/2021   11:22 AM 12/06/2020    1:45 PM  6CIT Screen  What Year? 0 points 0 points  What month? 0 points 0 points  What time? 0 points 0 points  Count back from 20 0 points 0 points  Months in reverse 2 points 0 points  Repeat phrase 0 points 0 points  Total Score 2 points 0 points    Immunizations Immunization History  Administered Date(s)  Administered   Fluad Quad(high Dose 65+) 03/08/2019, 03/08/2019, 03/18/2021   Influenza Inj Mdck Quad With Preservative 03/18/2018   Influenza Split 03/23/2013   Influenza, High Dose Seasonal PF 03/06/2014   Influenza,inj,Quad PF,6+ Mos 03/22/2015, 03/18/2017    Influenza-Unspecified 03/22/2016, 03/18/2018, 03/02/2019, 03/27/2020   Moderna Covid-19 Vaccine Bivalent Booster 33yrs & up 07/02/2021   Moderna Sars-Covid-2 Vaccination 08/02/2019, 08/30/2019, 04/24/2020, 02/19/2021   Pneumococcal Conjugate-13 04/26/2013   Pneumococcal Polysaccharide-23 06/23/2004, 12/11/2014   Td 03/24/2007   Tdap 12/05/2021   Zoster Recombinat (Shingrix) 07/04/2020, 09/14/2020    TDAP status: Up to date  Flu Vaccine status: Up to date  Pneumococcal vaccine status: Up to date  Covid-19 vaccine status: Completed vaccines  Qualifies for Shingles Vaccine? Yes   Zostavax completed Yes   Shingrix Completed?: Yes  Screening Tests Health Maintenance  Topic Date Due   INFLUENZA VACCINE  01/21/2022   TETANUS/TDAP  12/06/2031   Pneumonia Vaccine 60+ Years old  Completed   COVID-19 Vaccine  Completed   Zoster Vaccines- Shingrix  Completed   HPV VACCINES  Aged Out    Health Maintenance  There are no preventive care reminders to display for this patient.  Colorectal cancer screening: No longer required.   Lung Cancer Screening: (Low Dose CT Chest recommended if Age 33-80 years, 30 pack-year currently smoking OR have quit w/in 15years.) does not qualify.   Additional Screening:  Hepatitis C Screening: does not qualify;  Vision Screening: Recommended annual ophthalmology exams for early detection of glaucoma and other disorders of the eye. Is the patient up to date with their annual eye exam?  Yes  Who is the provider or what is the name of the office in which the patient attends annual eye exams? [facility] If pt is not established with a provider, would they like to be referred to a provider to establish care? No .   Dental Screening: Recommended annual dental exams for proper oral hygiene  Community Resource Referral / Chronic Care Management: CRR required this visit?  No   CCM required this visit?  No      Plan:     I have personally  reviewed and noted the following in the patient's chart:   Medical and social history Use of alcohol, tobacco or illicit drugs  Current medications and supplements including opioid prescriptions. Patient is not currently taking opioid prescriptions. Functional ability and status Nutritional status Physical activity Advanced directives List of other physicians Hospitalizations, surgeries, and ER visits in previous 12 months Vitals Screenings to include cognitive, depression, and falls Referrals and appointments  In addition, I have reviewed and discussed with patient certain preventive protocols, quality metrics, and best practice recommendations. A written personalized care plan for preventive services as well as general preventive health recommendations were provided to patient.     Chriss Driver, LPN   02/24/91   Nurse Notes: Pt is up to date on health maintenance and vaccines.

## 2021-12-10 ENCOUNTER — Other Ambulatory Visit: Payer: Self-pay | Admitting: Nurse Practitioner

## 2021-12-10 DIAGNOSIS — I25118 Atherosclerotic heart disease of native coronary artery with other forms of angina pectoris: Secondary | ICD-10-CM

## 2021-12-10 DIAGNOSIS — N400 Enlarged prostate without lower urinary tract symptoms: Secondary | ICD-10-CM

## 2021-12-10 DIAGNOSIS — K219 Gastro-esophageal reflux disease without esophagitis: Secondary | ICD-10-CM

## 2021-12-10 DIAGNOSIS — E034 Atrophy of thyroid (acquired): Secondary | ICD-10-CM

## 2021-12-10 DIAGNOSIS — E782 Mixed hyperlipidemia: Secondary | ICD-10-CM

## 2021-12-10 DIAGNOSIS — I10 Essential (primary) hypertension: Secondary | ICD-10-CM

## 2022-04-14 ENCOUNTER — Ambulatory Visit (HOSPITAL_COMMUNITY)
Admission: RE | Admit: 2022-04-14 | Discharge: 2022-04-14 | Disposition: A | Discharge status: Home / Self Care | Payer: Medicare Other | Source: Ambulatory Visit | Attending: Internal Medicine | Admitting: Internal Medicine

## 2022-04-14 ENCOUNTER — Inpatient Hospital Stay: Payer: Medicare Other | Attending: Internal Medicine

## 2022-04-14 DIAGNOSIS — Z902 Acquired absence of lung [part of]: Secondary | ICD-10-CM | POA: Insufficient documentation

## 2022-04-14 DIAGNOSIS — C3490 Malignant neoplasm of unspecified part of unspecified bronchus or lung: Secondary | ICD-10-CM | POA: Insufficient documentation

## 2022-04-14 DIAGNOSIS — J90 Pleural effusion, not elsewhere classified: Secondary | ICD-10-CM | POA: Diagnosis not present

## 2022-04-14 DIAGNOSIS — J432 Centrilobular emphysema: Secondary | ICD-10-CM | POA: Diagnosis not present

## 2022-04-14 DIAGNOSIS — Z85118 Personal history of other malignant neoplasm of bronchus and lung: Secondary | ICD-10-CM | POA: Insufficient documentation

## 2022-04-14 LAB — CBC WITH DIFFERENTIAL (CANCER CENTER ONLY)
Abs Immature Granulocytes: 0.01 10*3/uL (ref 0.00–0.07)
Basophils Absolute: 0 10*3/uL (ref 0.0–0.1)
Basophils Relative: 1 %
Eosinophils Absolute: 0.1 10*3/uL (ref 0.0–0.5)
Eosinophils Relative: 2 %
HCT: 38.3 % — ABNORMAL LOW (ref 39.0–52.0)
Hemoglobin: 13.1 g/dL (ref 13.0–17.0)
Immature Granulocytes: 0 %
Lymphocytes Relative: 26 %
Lymphs Abs: 1.5 10*3/uL (ref 0.7–4.0)
MCH: 31.8 pg (ref 26.0–34.0)
MCHC: 34.2 g/dL (ref 30.0–36.0)
MCV: 93 fL (ref 80.0–100.0)
Monocytes Absolute: 0.5 10*3/uL (ref 0.1–1.0)
Monocytes Relative: 8 %
Neutro Abs: 3.8 10*3/uL (ref 1.7–7.7)
Neutrophils Relative %: 63 %
Platelet Count: 151 10*3/uL (ref 150–400)
RBC: 4.12 MIL/uL — ABNORMAL LOW (ref 4.22–5.81)
RDW: 13.2 % (ref 11.5–15.5)
WBC Count: 6 10*3/uL (ref 4.0–10.5)
nRBC: 0 % (ref 0.0–0.2)

## 2022-04-14 LAB — CMP (CANCER CENTER ONLY)
ALT: 15 U/L (ref 0–44)
AST: 27 U/L (ref 15–41)
Albumin: 4.3 g/dL (ref 3.5–5.0)
Alkaline Phosphatase: 67 U/L (ref 38–126)
Anion gap: 7 (ref 5–15)
BUN: 13 mg/dL (ref 8–23)
CO2: 27 mmol/L (ref 22–32)
Calcium: 9.4 mg/dL (ref 8.9–10.3)
Chloride: 105 mmol/L (ref 98–111)
Creatinine: 1.05 mg/dL (ref 0.61–1.24)
GFR, Estimated: >60 mL/min (ref >60)
Glucose, Bld: 101 mg/dL — ABNORMAL HIGH (ref 70–99)
Potassium: 4.1 mmol/L (ref 3.5–5.1)
Sodium: 139 mmol/L (ref 135–145)
Total Bilirubin: 0.9 mg/dL (ref 0.3–1.2)
Total Protein: 8.3 g/dL — ABNORMAL HIGH (ref 6.5–8.1)

## 2022-04-14 MED ORDER — IOHEXOL 300 MG/ML  SOLN
75.0000 mL | Freq: Once | INTRAMUSCULAR | Status: AC | PRN
  Administered 2022-04-14: 75 mL via INTRAVENOUS

## 2022-04-14 MED ORDER — SODIUM CHLORIDE (PF) 0.9 % IJ SOLN
INTRAMUSCULAR | Status: AC
  Filled 2022-04-14: qty 50

## 2022-04-16 ENCOUNTER — Inpatient Hospital Stay: Payer: Medicare Other | Admitting: Internal Medicine

## 2022-04-16 ENCOUNTER — Other Ambulatory Visit: Payer: Self-pay

## 2022-04-16 VITALS — BP 124/55 | HR 72 | Temp 98.5°F | Resp 18 | Ht 69.0 in | Wt 167.4 lb

## 2022-04-16 DIAGNOSIS — Z902 Acquired absence of lung [part of]: Secondary | ICD-10-CM | POA: Diagnosis not present

## 2022-04-16 DIAGNOSIS — C3490 Malignant neoplasm of unspecified part of unspecified bronchus or lung: Secondary | ICD-10-CM

## 2022-04-16 NOTE — Progress Notes (Signed)
[place] Telephone:(336) [phone]   Fax:(336) [phone]  OFFICE PROGRESS NOTE  [name], [place] [address]  DIAGNOSIS: Stage IA (T1a, N0, M0) non-small cell lung cancer, squamous cell carcinoma presented with right upper lobe lung nodule   PRIOR THERAPY: status post right upper lobectomy with lymph node dissection on March 30, 2017 under the care of Dr. Roxan Hockey.  CURRENT THERAPY: Observation.  INTERVAL HISTORY: [name] [name] 81 y.o. male returns to the clinic today for annual follow-up visit accompanied by his wife.  The patient is feeling fine today with no concerning complaints.  He denied having any chest pain, shortness of breath, cough or hemoptysis.  He denied having any nausea, vomiting, diarrhea or constipation.  He has no headache or visual changes.  He denied having any recent weight loss or night sweats.  He is here today for evaluation with repeat CT scan of the chest for restaging of his disease.  MEDICAL HISTORY: Past Medical History:  Diagnosis Date   AAA (abdominal aortic aneurysm) ([place])    Aortic stenosis, severe    a. 02/2017: s/p TAVR with Edwards Sapien 3 THV (size 26 mm, model # 9600TFX, serial # [id number])   Arthritis    Atherosclerotic peripheral vascular disease ([place])    Atrial fibrillation, persistent ([facility])    a. not a candidate for [place] given hx of GI bleed   Bilateral carotid artery stenosis    Bladder cancer (HCC)    COPD (chronic obstructive pulmonary disease) ([place])    Gastroesophageal reflux disease without esophagitis 05/25/2019   Hyperlipidemia    Hypertension    Hypothyroidism    Lung cancer ([place])    Right   Lung mass    Right   Skin cancer    Vitamin D deficiency     ALLERGIES:  is allergic to lovenox [enoxaparin].  MEDICATIONS:  Current Outpatient Medications  Medication Sig Dispense Refill   alfuzosin (UROXATRAL) 10 MG 24 hr tablet Take 1 tablet (10 mg total) by mouth  at bedtime. 90 tablet 1   amLODipine (NORVASC) 10 MG tablet Take 1 tablet (10 mg total) by mouth daily. 90 tablet 1   apixaban (ELIQUIS) 5 MG TABS tablet Take 1 tablet (5 mg total) by mouth 2 (two) times daily. 60 tablet 6   atorvastatin (LIPITOR) 40 MG tablet Take 1 tablet (40 mg total) by mouth daily. 90 tablet 1   cholecalciferol (VITAMIN D) 1000 UNITS tablet Take 1 tablet (1,000 Units total) by mouth daily. 30 tablet 6   ferrous gluconate (FERGON) 324 MG tablet TAKE 1 TABLET 2 TIMES A DAY WITH A MEAL 180 tablet 1   levothyroxine (SYNTHROID) 88 MCG tablet Take 1 tablet (88 mcg total) by mouth daily. 90 tablet 1   lisinopril (ZESTRIL) 40 MG tablet Take 1 tablet (40 mg total) by mouth daily. 90 tablet 1   metoprolol succinate (TOPROL-XL) 25 MG 24 hr tablet Take 0.5 tablets (12.5 mg total) by mouth daily. 45 tablet 1   Multiple Vitamin (MULTIVITAMIN WITH MINERALS) TABS tablet Take 1 tablet by mouth daily. One A Day Multivitamin for Men     Omega-3 Fatty Acids (FISH OIL) 1200 MG CAPS Take 2,400 mg by mouth daily.      pantoprazole (PROTONIX) 40 MG tablet TAKE  (1)  TABLET TWICE A DAY. 180 tablet 1   No current facility-administered medications for this visit.    SURGICAL HISTORY:  Past Surgical History:  Procedure Laterality Date   ABDOMINAL AORTIC ANEURYSM REPAIR  08/09/2009   BACK SURGERY     x2   CATARACT EXTRACTION BILATERAL W/ ANTERIOR VITRECTOMY Bilateral 2022   CYSTOSCOPY W/ RETROGRADES Bilateral 09/02/2018   Procedure: CYSTOSCOPY WITH RETROGRADE PYELOGRAM;  Surgeon: Franchot Gallo, MD;  Location: [facility];  Service: Urology;  Laterality: Bilateral;   CYSTOSCOPY W/ RETROGRADES Bilateral 03/20/2020   Procedure: CYSTOSCOPY WITH BILATERAL RETROGRADE PYELOGRAM;  Surgeon: Franchot Gallo, MD;  Location: [facility];  Service: Urology;  Laterality: Bilateral;   CYSTOSCOPY WITH BIOPSY N/A 09/02/2018   Procedure: CYSTOSCOPY WITH BIOPSY;  Surgeon: Franchot Gallo, MD;   Location: [facility];  Service: Urology;  Laterality: N/A;   ESOPHAGOGASTRODUODENOSCOPY N/A 02/21/2017   Procedure: ESOPHAGOGASTRODUODENOSCOPY (EGD);  Surgeon: Jerene Bears, MD;  Location: [facility]COPY;  Service: Gastroenterology;  Laterality: N/A;   HYDROCELE EXCISION / REPAIR     LOBECTOMY Right 03/30/2017   Procedure: RIGHT UPPER LOBECTOMY LUNG;  Surgeon: Melrose Nakayama, MD;  Location: [facility];  Service: Thoracic;  Laterality: Right;   SKIN CANCER EXCISION     SPINE SURGERY     TEE WITHOUT CARDIOVERSION N/A 02/24/2017   Procedure: TRANSESOPHAGEAL ECHOCARDIOGRAM (TEE);  Surgeon: Sherren Mocha, MD;  Location: [facility];  Service: Open Heart Surgery;  Laterality: N/A;   TRANSCATHETER AORTIC VALVE REPLACEMENT, TRANSFEMORAL N/A 02/24/2017   Procedure: TRANSCATHETER AORTIC VALVE REPLACEMENT, TRANSFEMORAL using a 13mm Edwards Sapien 3 Aortic Valve;  Surgeon: Sherren Mocha, MD;  Location: [facility];  Service: Open Heart Surgery;  Laterality: N/A;   TRANSURETHRAL RESECTION OF BLADDER TUMOR N/A 06/06/2016   Procedure: TRANSURETHRAL RESECTION OF BLADDER TUMOR (TURBT);  Surgeon: Franchot Gallo, MD;  Location: [facility];  Service: Urology;  Laterality: N/A;   TRANSURETHRAL RESECTION OF BLADDER TUMOR N/A 07/22/2016   Procedure: TRANSURETHRAL RESECTION OF BLADDER TUMOR (TURBT);  Surgeon: Franchot Gallo, MD;  Location: [facility];  Service: Urology;  Laterality: N/A;   TRANSURETHRAL RESECTION OF BLADDER TUMOR N/A 03/20/2020   Procedure: TRANSURETHRAL RESECTION OF BLADDER TUMOR (TURBT);  Surgeon: Franchot Gallo, MD;  Location: [facility];  Service: Urology;  Laterality: N/A;   VIDEO ASSISTED THORACOSCOPY (VATS)/WEDGE RESECTION Right 03/30/2017   Procedure: VIDEO ASSISTED THORACOSCOPY (VATS)/WEDGE RESECTION;  Surgeon: Melrose Nakayama, MD;  Location[facility];  Service: Thoracic;  Laterality: Right;    REVIEW OF SYSTEMS:  A comprehensive review of systems was negative.   PHYSICAL  EXAMINATION: General appearance: alert, cooperative, and no distress Head: Normocephalic, without obvious abnormality, atraumatic Neck: no adenopathy, no JVD, supple, symmetrical, trachea midline, and thyroid not enlarged, symmetric, no tenderness/mass/nodules Lymph nodes: Cervical, supraclavicular, and axillary nodes normal. Resp: clear to auscultation bilaterally Back: symmetric, no curvature. ROM normal. No CVA tenderness. Cardio: regular rate and rhythm, S1, S2 normal, no murmur, click, rub or gallop GI: soft, non-tender; bowel sounds normal; no masses,  no organomegaly Extremities: extremities normal, atraumatic, no cyanosis or edema  ECOG PERFORMANCE STATUS: 1 - Symptomatic but completely ambulatory  Blood pressure (!) 124/55, pulse 72, temperature 98.5 F (36.9 C), temperature source Oral, resp. rate 18, height 5\' 9"  (1.753 m), weight 167 lb 6 oz (75.9 kg), SpO2 96 %.  LABORATORY DATA: Lab Results  Component Value Date   WBC 6.0 04/14/2022   HGB 13.1 04/14/2022   HCT 38.3 (L) 04/14/2022   MCV 93.0 04/14/2022   PLT 151 04/14/2022      Chemistry      Component Value Date/Time   NA 139  04/14/2022 1005   NA 141 12/05/2021 0833   NA 140 05/07/2017 1419   K 4.1 04/14/2022 1005   K 4.2 05/07/2017 1419   CL 105 04/14/2022 1005   CO2 27 04/14/2022 1005   CO2 25 05/07/2017 1419   BUN 13 04/14/2022 1005   BUN 14 12/05/2021 0833   BUN 14.5 05/07/2017 1419   CREATININE 1.05 04/14/2022 1005   CREATININE 1.0 05/07/2017 1419      Component Value Date/Time   CALCIUM 9.4 04/14/2022 1005   CALCIUM 9.6 05/07/2017 1419   ALKPHOS 67 04/14/2022 1005   ALKPHOS 85 05/07/2017 1419   AST 27 04/14/2022 1005   AST 23 05/07/2017 1419   ALT 15 04/14/2022 1005   ALT 14 05/07/2017 1419   BILITOT 0.9 04/14/2022 1005   BILITOT 0.48 05/07/2017 1419       RADIOGRAPHIC STUDIES: CT Chest W Contrast  Result Date: 04/14/2022 CLINICAL DATA:  Non-small cell lung cancer staging; * Tracking  Code: BO * EXAM: CT CHEST WITH CONTRAST TECHNIQUE: Multidetector CT imaging of the chest was performed during intravenous contrast administration. RADIATION DOSE REDUCTION: This exam was performed according to the departmental dose-optimization program which includes automated exposure control, adjustment of the mA and/or kV according to patient size and/or use of iterative reconstruction technique. CONTRAST:  80mL OMNIPAQUE IOHEXOL 300 MG/ML  SOLN COMPARISON:  Chest CT dated March 20, 2021 FINDINGS: Cardiovascular: Cardiomegaly. No pericardial effusion. Severe left main and three-vessel coronary artery calcifications. Status post transcatheter aortic valve replacement. Normal caliber thoracic aorta with severe atherosclerotic disease. 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 No suspicious filling defects of the central pulmonary arteries. Mediastinum/Nodes: Small hiatal hernia. Thyroid is unremarkable. No pathologically enlarged lymph nodes seen in the chest. Lungs/Pleura: Prior right upper lobectomy. Moderate to severe centrilobular emphysema. Stable chronic trace right pleural effusion. No new or enlarging pulmonary nodules. Upper Abdomen: Cholelithiasis with no evidence of gallbladder wall thickening. No acute abnormality. Musculoskeletal: No chest wall abnormality. No acute or significant osseous findings. IMPRESSION: 1. Prior right upper lobectomy with no evidence of local recurrence or metastatic disease. 2. 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 3. Severe left main and three-vessel coronary artery calcifications. 4. Aortic Atherosclerosis (ICD10-I70.0) and Emphysema (ICD10-J43.9). Electronically Signed   By: Yetta Glassman M.D.   On: 04/14/2022 16:46     ASSESSMENT AND PLAN: This is a very pleasant 81 years old white male with a stage IA non-small cell  lung cancer status post right upper lobectomy with lymph node dissection in October 2018. The patient is currently on observation and he is feeling fine with no concerning complaints. He had repeat CT scan of the chest performed recently.  I personally and independently reviewed the scan and discussed the result with the patient today. His scan showed no concerning findings for disease recurrence or metastasis. I recommended for him to continue on observation with repeat CT scan of the chest in 1 year. The patient was advised to call immediately if he has any other concerning symptoms in the interval. The patient voices understanding of current disease status and treatment options and is in agreement with the current care plan. All questions were answered. The patient knows to call the clinic with any problems, questions or concerns. We can certainly see the patient much sooner if necessary.    Disclaimer: This note was dictated with voice  recognition software. Similar sounding words can inadvertently be transcribed and may not be corrected upon review.

## 2022-06-09 ENCOUNTER — Ambulatory Visit (INDEPENDENT_AMBULATORY_CARE_PROVIDER_SITE_OTHER): Payer: Medicare Other | Admitting: Nurse Practitioner

## 2022-06-09 ENCOUNTER — Encounter: Payer: Self-pay | Admitting: Nurse Practitioner

## 2022-06-09 VITALS — BP 122/61 | HR 57 | Temp 98.2°F | Resp 20 | Ht 69.0 in | Wt 169.0 lb

## 2022-06-09 DIAGNOSIS — K219 Gastro-esophageal reflux disease without esophagitis: Secondary | ICD-10-CM | POA: Diagnosis not present

## 2022-06-09 DIAGNOSIS — E782 Mixed hyperlipidemia: Secondary | ICD-10-CM

## 2022-06-09 DIAGNOSIS — I6523 Occlusion and stenosis of bilateral carotid arteries: Secondary | ICD-10-CM

## 2022-06-09 DIAGNOSIS — I739 Peripheral vascular disease, unspecified: Secondary | ICD-10-CM | POA: Diagnosis not present

## 2022-06-09 DIAGNOSIS — I10 Essential (primary) hypertension: Secondary | ICD-10-CM

## 2022-06-09 DIAGNOSIS — C3491 Malignant neoplasm of unspecified part of right bronchus or lung: Secondary | ICD-10-CM | POA: Diagnosis not present

## 2022-06-09 DIAGNOSIS — I25118 Atherosclerotic heart disease of native coronary artery with other forms of angina pectoris: Secondary | ICD-10-CM

## 2022-06-09 DIAGNOSIS — E034 Atrophy of thyroid (acquired): Secondary | ICD-10-CM

## 2022-06-09 DIAGNOSIS — I350 Nonrheumatic aortic (valve) stenosis: Secondary | ICD-10-CM | POA: Diagnosis not present

## 2022-06-09 DIAGNOSIS — J449 Chronic obstructive pulmonary disease, unspecified: Secondary | ICD-10-CM | POA: Diagnosis not present

## 2022-06-09 DIAGNOSIS — Z6826 Body mass index (BMI) 26.0-26.9, adult: Secondary | ICD-10-CM

## 2022-06-09 DIAGNOSIS — E559 Vitamin D deficiency, unspecified: Secondary | ICD-10-CM

## 2022-06-09 DIAGNOSIS — N400 Enlarged prostate without lower urinary tract symptoms: Secondary | ICD-10-CM

## 2022-06-09 DIAGNOSIS — I4821 Permanent atrial fibrillation: Secondary | ICD-10-CM | POA: Diagnosis not present

## 2022-06-09 LAB — CBC WITH DIFFERENTIAL/PLATELET

## 2022-06-09 MED ORDER — PANTOPRAZOLE SODIUM 40 MG PO TBEC: DELAYED_RELEASE_TABLET | ORAL | 1 refills | Status: DC

## 2022-06-09 MED ORDER — METOPROLOL SUCCINATE ER 25 MG PO TB24: 12.5000 mg | ORAL_TABLET | Freq: Every day | ORAL | 1 refills | Status: DC

## 2022-06-09 MED ORDER — LEVOTHYROXINE SODIUM 88 MCG PO TABS: 88.0000 ug | ORAL_TABLET | Freq: Every day | ORAL | 1 refills | Status: DC

## 2022-06-09 MED ORDER — APIXABAN 5 MG PO TABS: 5.0000 mg | ORAL_TABLET | Freq: Two times a day (BID) | ORAL | 6 refills | Status: DC

## 2022-06-09 MED ORDER — AMLODIPINE BESYLATE 10 MG PO TABS: 10.0000 mg | ORAL_TABLET | Freq: Every day | ORAL | 1 refills | Status: DC

## 2022-06-09 MED ORDER — ALFUZOSIN HCL ER 10 MG PO TB24: 10.0000 mg | ORAL_TABLET | Freq: Every day | ORAL | 1 refills | Status: DC

## 2022-06-09 MED ORDER — LISINOPRIL 40 MG PO TABS: 40.0000 mg | ORAL_TABLET | Freq: Every day | ORAL | 1 refills | Status: DC

## 2022-06-09 MED ORDER — ATORVASTATIN CALCIUM 40 MG PO TABS: 40.0000 mg | ORAL_TABLET | Freq: Every day | ORAL | 1 refills | Status: DC

## 2022-06-09 MED ORDER — FERROUS GLUCONATE 324 (38 FE) MG PO TABS: ORAL_TABLET | ORAL | 1 refills | Status: DC

## 2022-06-09 NOTE — Progress Notes (Signed)
Subjective:    Patient ID: [name] [name], male    DOB: January 15, 1941, 81 y.o.   MRN: [id number]   Chief Complaint: medical management of chronic issues     HPI:  [name] [name] is a 81 y.o. who identifies as a male who was assigned male at birth.   Social history: Lives with: wife Work history: retired from [employer] in today for follow up of the following chronic medical issues:  1. Primary hypertension No c/o chest pain, sob or headache. Does not check blood pressure at home. BP Readings from Last 3 Encounters:  04/16/22 (!) 124/55  12/05/21 127/72  11/13/21 (!) 146/72     2. Coronary artery disease of native artery of native heart with stable angina pectoris ([place]) 3. Permanent atrial fibrillation [facility]) Last saw cardiology on 11/13/21. He was having occasional dizziness but other then that was doing well. Review of office note stated no change in plan of care. Is on eliquis with no bleeding issues.  4. Nonrheumatic aortic valve stenosis Patient had valve replacement and is doing well.  5. PVD (peripheral vascular disease) ([place]) Saw vascular surgeon on 08/26/21. No change to plan of care  6. Bilateral carotid artery stenosis Had carotid duplex in 2022 with less than 36% stenosis- will repeat scan in 2024.  7. Chronic obstructive pulmonary disease, unspecified COPD type ([place]) Is on no inhalers and is doing well.  8. Primary lung cancer, right ([place]) Had right upper lobectomy in 2018. Saw pulmonology on 04/16/22.he has had no reoccurence to this point.  9. Gastroesophageal reflux disease without esophagitis Is on protonix daily and is doing well.  10. Hypothyroidism due to acquired atrophy of thyroid No problems that he is aware of. Lab Results  Component Value Date   TSH 2.180 12/05/2021     11. Benign prostatic hyperplasia without lower urinary tract symptoms Denies any voiding issues.  12. Mixed hyperlipidemia Tries to watch diet but does no  dedicated exercise. Lab Results  Component Value Date   CHOL 96 (L) 12/05/2021   HDL 28 (L) 12/05/2021   LDLCALC 50 12/05/2021   TRIG 88 12/05/2021   CHOLHDL 3.4 12/05/2021     13. Vitamin D deficiency On daily vitamin d supplement.  14. BMI 26.0-26.9,adult No recent weight changes Wt Readings from Last 3 Encounters:  06/09/22 169 lb (76.7 kg)  04/16/22 167 lb 6 oz (75.9 kg)  12/09/21 169 lb (76.7 kg)   BMI Readings from Last 3 Encounters:  06/09/22 24.96 kg/m  04/16/22 24.72 kg/m  12/09/21 24.96 kg/m     New complaints: None today  Allergies  Allergen Reactions   Lovenox [Enoxaparin]     HIT panel ordered 10/10   Outpatient Encounter Medications as of 06/09/2022  Medication Sig   alfuzosin (UROXATRAL) 10 MG 24 hr tablet Take 1 tablet (10 mg total) by mouth at bedtime.   amLODipine (NORVASC) 10 MG tablet Take 1 tablet (10 mg total) by mouth daily.   apixaban (ELIQUIS) 5 MG TABS tablet Take 1 tablet (5 mg total) by mouth 2 (two) times daily.   atorvastatin (LIPITOR) 40 MG tablet Take 1 tablet (40 mg total) by mouth daily.   cholecalciferol (VITAMIN D) 1000 UNITS tablet Take 1 tablet (1,000 Units total) by mouth daily.   ferrous gluconate (FERGON) 324 MG tablet TAKE 1 TABLET 2 TIMES A DAY WITH A MEAL   levothyroxine (SYNTHROID) 88 MCG tablet Take 1 tablet (88 mcg total) by mouth  daily.   lisinopril (ZESTRIL) 40 MG tablet Take 1 tablet (40 mg total) by mouth daily.   metoprolol succinate (TOPROL-XL) 25 MG 24 hr tablet Take 0.5 tablets (12.5 mg total) by mouth daily.   Multiple Vitamin (MULTIVITAMIN WITH MINERALS) TABS tablet Take 1 tablet by mouth daily. One A Day Multivitamin for Men   Omega-3 Fatty Acids (FISH OIL) 1200 MG CAPS Take 2,400 mg by mouth daily.    pantoprazole (PROTONIX) 40 MG tablet TAKE  (1)  TABLET TWICE A DAY.   No facility-administered encounter medications on file as of 06/09/2022.    Past Surgical History:  Procedure Laterality Date    ABDOMINAL AORTIC ANEURYSM REPAIR  08/09/2009   BACK SURGERY     x2   CATARACT EXTRACTION BILATERAL W/ ANTERIOR VITRECTOMY Bilateral 2022   CYSTOSCOPY W/ RETROGRADES Bilateral 09/02/2018   Procedure: CYSTOSCOPY WITH RETROGRADE PYELOGRAM;  Surgeon: Franchot Gallo, MD;  Location: [facility];  Service: Urology;  Laterality: Bilateral;   CYSTOSCOPY W/ RETROGRADES Bilateral 03/20/2020   Procedure: CYSTOSCOPY WITH BILATERAL RETROGRADE PYELOGRAM;  Surgeon: Franchot Gallo, MD;  [facility];  Service: Urology;  Laterality: Bilateral;   CYSTOSCOPY WITH BIOPSY N/A 09/02/2018   Procedure: CYSTOSCOPY WITH BIOPSY;  Surgeon: Franchot Gallo, MD;  [facility];  Service: Urology;  Laterality: N/A;   ESOPHAGOGASTRODUODENOSCOPY N/A 02/21/2017   Procedure: ESOPHAGOGASTRODUODENOSCOPY (EGD);  Surgeon: Jerene Bears, MD;  Location: [facility] ENDOSCOPY;  Service: Gastroenterology;  Laterality: N/A;   HYDROCELE EXCISION / REPAIR     LOBECTOMY Right 03/30/2017   Procedure: RIGHT UPPER LOBECTOMY LUNG;  Surgeon: Melrose Nakayama, MD;  Location: [place];  Service: Thoracic;  Laterality: Right;   SKIN CANCER EXCISION     SPINE SURGERY     TEE WITHOUT CARDIOVERSION N/A 02/24/2017   Procedure: TRANSESOPHAGEAL ECHOCARDIOGRAM (TEE);  Surgeon: Sherren Mocha, MD;  Location: [place];  Service: Open Heart Surgery;  Laterality: N/A;   TRANSCATHETER AORTIC VALVE REPLACEMENT, TRANSFEMORAL N/A 02/24/2017   Procedure: TRANSCATHETER AORTIC VALVE REPLACEMENT, TRANSFEMORAL using a 73m Edwards Sapien 3 Aortic Valve;  Surgeon: CSherren Mocha MD;  Location: [place]  Service: Open Heart Surgery;  Laterality: N/A;   TRANSURETHRAL RESECTION OF BLADDER TUMOR N/A 06/06/2016   Procedure: TRANSURETHRAL RESECTION OF BLADDER TUMOR (TURBT);  Surgeon: SFranchot Gallo MD;  [facility];  Service: Urology;  Laterality: N/A;   TRANSURETHRAL RESECTION OF BLADDER TUMOR N/A 07/22/2016   Procedure:  TRANSURETHRAL RESECTION OF BLADDER TUMOR (TURBT);  Surgeon: SFranchot Gallo MD;  [facility];  Service: Urology;  Laterality: N/A;   TRANSURETHRAL RESECTION OF BLADDER TUMOR N/A 03/20/2020   Procedure: TRANSURETHRAL RESECTION OF BLADDER TUMOR (TURBT);  Surgeon: DFranchot Gallo MD;  [facility];  Service: Urology;  Laterality: N/A;   VIDEO ASSISTED THORACOSCOPY (VATS)/WEDGE RESECTION Right 03/30/2017   Procedure: VIDEO ASSISTED THORACOSCOPY (VATS)/WEDGE RESECTION;  Surgeon: HMelrose Nakayama MD;  [facility];  Service: Thoracic;  Laterality: Right;    Family History  Problem Relation Age of Onset   Heart disease Father        Heart Disease before age 82  Alcohol abuse Father    Heart attack Father    Cancer Mother        Lung   COPD Mother    COPD Sister    Lupus Sister    Hypertension Brother    Gout Brother    Heart attack Son    Gout Brother    Prostatitis  Brother       Controlled substance contract: n/a     Review of Systems  Constitutional:  Negative for diaphoresis.  Eyes:  Negative for pain.  Respiratory:  Negative for shortness of breath.   Cardiovascular:  Negative for chest pain, palpitations and leg swelling.  Gastrointestinal:  Negative for abdominal pain.  Endocrine: Negative for polydipsia.  Skin:  Negative for rash.  Neurological:  Negative for dizziness, weakness and headaches.  Hematological:  Does not bruise/bleed easily.  All other systems reviewed and are negative.      Objective:   Physical Exam Vitals and nursing note reviewed.  Constitutional:      Appearance: Normal appearance. He is well-developed.  HENT:     Head: Normocephalic.     Nose: Nose normal.     Mouth/Throat:     Mouth: Mucous membranes are moist.     Pharynx: Oropharynx is clear.  Eyes:     Pupils: Pupils are equal, round, and reactive to light.  Neck:     Thyroid: No thyroid mass or thyromegaly.     Vascular: No carotid bruit or JVD.      Trachea: Phonation normal.  Cardiovascular:     Rate and Rhythm: Normal rate and regular rhythm.  Pulmonary:     Effort: Pulmonary effort is normal. No respiratory distress.     Breath sounds: Normal breath sounds.  Abdominal:     General: Bowel sounds are normal.     Palpations: Abdomen is soft.     Tenderness: There is no abdominal tenderness.  Musculoskeletal:        General: Normal range of motion.     Cervical back: Normal range of motion and neck supple.  Lymphadenopathy:     Cervical: No cervical adenopathy.  Skin:    General: Skin is warm and dry.  Neurological:     Mental Status: He is alert and oriented to person, place, and time.  Psychiatric:        Behavior: Behavior normal.        Thought Content: Thought content normal.        Judgment: Judgment normal.    BP 122/61   Pulse (!) 57   Temp 98.2 F (36.8 C) (Temporal)   Resp 20   Ht _0  (1.753 m)   Wt 169 lb (76.7 kg)   SpO2 98%   BMI 24.96 kg/m         Assessment & Plan:  [name] [name] comes in today with chief complaint of Medical Management of Chronic Issues   Diagnosis and orders addressed:  1. Primary hypertension Low sodium diet - amLODipine (NORVASC) 10 MG tablet; Take 1 tablet (10 mg total) by mouth daily.  Dispense: 90 tablet; Refill: 1 - CBC with Differential/Platelet - CMP14+EGFR  2. Coronary artery disease of native artery of native heart with stable angina pectoris (HCC) - metoprolol succinate (TOPROL-XL) 25 MG 24 hr tablet; Take 0.5 tablets (12.5 mg total) by mouth daily.  Dispense: 45 tablet; Refill: 1  3. Permanent atrial fibrillation (HCC) - apixaban (ELIQUIS) 5 MG TABS tablet; Take 1 tablet (5 mg total) by mouth 2 (two) times daily.  Dispense: 60 tablet; Refill: 6  4. Nonrheumatic aortic valve stenosis 5. PVD (peripheral vascular disease) ([place]) 6. Bilateral carotid artery stenosis Keep follow up with cardiology  7. Chronic obstructive pulmonary disease, unspecified  COPD type ([place])  8. Primary lung cancer, right [facility]) Continue yearly follow up with oncology  9. Gastroesophageal  reflux disease without esophagitis Avoid spicy foods Do not eat 2 hours prior to bedtime - pantoprazole (PROTONIX) 40 MG tablet; TAKE  (1)  TABLET TWICE A DAY.  Dispense: 180 tablet; Refill: 1  10. Hypothyroidism due to acquired atrophy of thyroid Labs pending - levothyroxine (SYNTHROID) 88 MCG tablet; Take 1 tablet (88 mcg total) by mouth daily.  Dispense: 90 tablet; Refill: 1 - Thyroid Panel With TSH  11. Benign prostatic hyperplasia without lower urinary tract symptoms - alfuzosin (UROXATRAL) 10 MG 24 hr tablet; Take 1 tablet (10 mg total) by mouth at bedtime.  Dispense: 90 tablet; Refill: 1  12. Mixed hyperlipidemia - atorvastatin (LIPITOR) 40 MG tablet; Take 1 tablet (40 mg total) by mouth daily.  Dispense: 90 tablet; Refill: 1 - lisinopril (ZESTRIL) 40 MG tablet; Take 1 tablet (40 mg total) by mouth daily.  Dispense: 90 tablet; Refill: 1 - Lipid panel  13. Vitamin D deficiency Continue vitamin d supplement  14. BMI 26.0-26.9,adult Discussed diet and exercise for person with BMI >25 Will recheck weight in 3-6 months    Labs pending Health Maintenance reviewed Diet and exercise encouraged  Follow up plan: 6 months   [name] Hassell Done, FNP

## 2022-06-09 NOTE — Patient Instructions (Signed)

## 2022-06-10 LAB — CMP14+EGFR
ALT: 16 IU/L (ref 0–44)
AST: 32 IU/L (ref 0–40)
Albumin/Globulin Ratio: 1.5 (ref 1.2–2.2)
Albumin: 4.4 g/dL (ref 3.7–4.7)
Alkaline Phosphatase: 71 IU/L (ref 44–121)
BUN/Creatinine Ratio: 11 (ref 10–24)
BUN: 13 mg/dL (ref 8–27)
Bilirubin Total: 0.7 mg/dL (ref 0.0–1.2)
CO2: 23 mmol/L (ref 20–29)
Calcium: 9.3 mg/dL (ref 8.6–10.2)
Chloride: 104 mmol/L (ref 96–106)
Creatinine, Ser: 1.19 mg/dL (ref 0.76–1.27)
Globulin, Total: 3 g/dL (ref 1.5–4.5)
Glucose: 107 mg/dL — ABNORMAL HIGH (ref 70–99)
Potassium: 4.5 mmol/L (ref 3.5–5.2)
Sodium: 141 mmol/L (ref 134–144)
Total Protein: 7.4 g/dL (ref 6.0–8.5)
eGFR: 61 mL/min/{1.73_m2} (ref >59)

## 2022-06-10 LAB — CBC WITH DIFFERENTIAL/PLATELET
Basophils Absolute: 0 10*3/uL (ref 0.0–0.2)
Basos: 1 %
EOS (ABSOLUTE): 0.1 10*3/uL (ref 0.0–0.4)
Eos: 2 %
Hematocrit: 35.6 % — ABNORMAL LOW (ref 37.5–51.0)
Hemoglobin: 12.2 g/dL — ABNORMAL LOW (ref 13.0–17.7)
Immature Grans (Abs): 0 10*3/uL (ref 0.0–0.1)
Immature Granulocytes: 0 %
Lymphocytes Absolute: 1.3 10*3/uL (ref 0.7–3.1)
Lymphs: 22 %
MCH: 32.4 pg (ref 26.6–33.0)
MCHC: 34.3 g/dL (ref 31.5–35.7)
MCV: 94 fL (ref 79–97)
Monocytes Absolute: 0.4 10*3/uL (ref 0.1–0.9)
Monocytes: 7 %
Neutrophils Absolute: 4.1 10*3/uL (ref 1.4–7.0)
Neutrophils: 68 %
Platelets: 134 10*3/uL — ABNORMAL LOW (ref 150–450)
RBC: 3.77 x10E6/uL — ABNORMAL LOW (ref 4.14–5.80)
RDW: 12.1 % (ref 11.6–15.4)
WBC: 6 10*3/uL (ref 3.4–10.8)

## 2022-06-10 LAB — LIPID PANEL
Chol/HDL Ratio: 3.4 ratio (ref 0.0–5.0)
Cholesterol, Total: 100 mg/dL (ref 100–199)
HDL: 29 mg/dL — ABNORMAL LOW (ref >39)
LDL Chol Calc (NIH): 50 mg/dL (ref 0–99)
Triglycerides: 112 mg/dL (ref 0–149)
VLDL Cholesterol Cal: 21 mg/dL (ref 5–40)

## 2022-06-10 LAB — THYROID PANEL WITH TSH
Free Thyroxine Index: 1.6 (ref 1.2–4.9)
T3 Uptake Ratio: 26 % (ref 24–39)
T4, Total: 6.2 ug/dL (ref 4.5–12.0)
TSH: 3.33 u[IU]/mL (ref 0.450–4.500)

## 2022-08-25 NOTE — Progress Notes (Signed)
History of Present Illness:  Here for bladder cancer surveillance.  He underwent emergent TURBT on 12.15.2017 for a large bladder cancer burden at his right bladder neck, posterior dome and left lateral wall. He did well after the procedure. He apparently been bleeding for 3 months. He is a cigarette smoker.    Pathology revealed high-grade, NMIBC. Muscle was present in the specimen.    He underwent repeat resection on 1.30.2018. 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    He was initiated on BCG and competed his 6 induction treatments on 3.27.2018.    5.15.2018 -he was found to have a small papillary recurrence on his anterior bladder wall--he underwent cysto/cauterization of this on 5.17.2018.  7.31.2018. 1st 3 maintenance BCG's completed  9.18.2018: Office cysto negative  10.24.2018: completed 3 maintenance BCG Rx's  1.22.2019: Surveillance cystoscopy.  2.19.2019: Completed 3 maintenance BCG treatments  4.2.2019: He has had no blood or burning with urination since his BCG has been completed. He tolerated the BCG well.  2.25.2020--Cysto--small BT recurrence.  3.12.2020: TURBT/bilateral RGPs. Cytologies of renal washings nml. Bladder tumor LG NMIBC.  6.2.2020: Cysto negative.  10.6.2020: Cystoscopy negative 3.2021: Cystoscopy negative, cytology with atypia 11.09.2021: Repeat TURBT/gemcitabine 9.28.2021. Path--benign (inflammation). 3.8.2022: Cysto negative, cytology--NEGATIVE FOR HIGH-GRADE UROTHELIAL CARCINOMA ([place]).  REACTIVE UROTHELIAL CELLS ARE PRESENT.  CELLULAR DEGENERATION IS PRESENT.  ACUTE INFLAMMATION AND BACTERIA ARE PRESENT.  9.6.2022: Cystoscopy negative.  Cytology negative. 3.7.2023: Cysto/cytology negative (culture revealed UTI).  3.5.20204:  Past Medical History:  Diagnosis Date   AAA (abdominal aortic aneurysm) (HCC)    Aortic stenosis, severe    a. 02/2017: s/p TAVR with Edwards Sapien 3 THV (size 26 mm, model #  9600TFX, serial # [id number])   Arthritis    Atherosclerotic peripheral vascular disease (HCC)    Atrial fibrillation, persistent (HCC)    a. not a candidate for [facility] given hx of GI bleed   Bilateral carotid artery stenosis    Bladder cancer (HCC)    COPD (chronic obstructive pulmonary disease) ([place])    Gastroesophageal reflux disease without esophagitis 05/25/2019   Hyperlipidemia    Hypertension    Hypothyroidism    Lung cancer ([place])    Right   Lung mass    Right   Skin cancer    Vitamin D deficiency     Past Surgical History:  Procedure Laterality Date   ABDOMINAL AORTIC ANEURYSM REPAIR  08/09/2009   BACK SURGERY     x2   CATARACT EXTRACTION BILATERAL W/ ANTERIOR VITRECTOMY Bilateral 2022   CYSTOSCOPY W/ RETROGRADES Bilateral 09/02/2018   Procedure: CYSTOSCOPY WITH RETROGRADE PYELOGRAM;  Surgeon: Franchot Gallo, MD;  Location: [facility];  Service: Urology;  Laterality: Bilateral;   CYSTOSCOPY W/ RETROGRADES Bilateral 03/20/2020   Procedure: CYSTOSCOPY WITH BILATERAL RETROGRADE PYELOGRAM;  Surgeon: Franchot Gallo, MD;  [facility];  Service: Urology;  Laterality: Bilateral;   CYSTOSCOPY WITH BIOPSY N/A 09/02/2018   Procedure: CYSTOSCOPY WITH BIOPSY;  Surgeon: Franchot Gallo, MD;  Location: [facility];  Service: Urology;  Laterality: N/A;   ESOPHAGOGASTRODUODENOSCOPY N/A 02/21/2017   Procedure: ESOPHAGOGASTRODUODENOSCOPY (EGD);  Surgeon: Jerene Bears, MD;  [facility];  Service: Gastroenterology;  Laterality: N/A;   HYDROCELE EXCISION / REPAIR     LOBECTOMY Right 03/30/2017   Procedure: RIGHT UPPER LOBECTOMY LUNG;  Surgeon: Melrose Nakayama, MD;  Location: [place];  Service: Thoracic;  Laterality: Right;   SKIN CANCER EXCISION     SPINE SURGERY  TEE WITHOUT CARDIOVERSION N/A 02/24/2017   Procedure: TRANSESOPHAGEAL ECHOCARDIOGRAM (TEE);  Surgeon: Sherren Mocha, MD;  Location: [place];  Service: Open Heart Surgery;   Laterality: N/A;   TRANSCATHETER AORTIC VALVE REPLACEMENT, TRANSFEMORAL N/A 02/24/2017   Procedure: TRANSCATHETER AORTIC VALVE REPLACEMENT, TRANSFEMORAL using a 74m Edwards Sapien 3 Aortic Valve;  Surgeon: CSherren Mocha MD;  [place]  Service: Open Heart Surgery;  Laterality: N/A;   TRANSURETHRAL RESECTION OF BLADDER TUMOR N/A 06/06/2016   Procedure: TRANSURETHRAL RESECTION OF BLADDER TUMOR (TURBT);  Surgeon: SFranchot Gallo MD;  [facility];  Service: Urology;  Laterality: N/A;   TRANSURETHRAL RESECTION OF BLADDER TUMOR N/A 07/22/2016   Procedure: TRANSURETHRAL RESECTION OF BLADDER TUMOR (TURBT);  Surgeon: SFranchot Gallo MD;  [facility];  Service: Urology;  Laterality: N/A;   TRANSURETHRAL RESECTION OF BLADDER TUMOR N/A 03/20/2020   Procedure: TRANSURETHRAL RESECTION OF BLADDER TUMOR (TURBT);  Surgeon: DFranchot Gallo MD;  [facility];  Service: Urology;  Laterality: N/A;   VIDEO ASSISTED THORACOSCOPY (VATS)/WEDGE RESECTION Right 03/30/2017   Procedure: VIDEO ASSISTED THORACOSCOPY (VATS)/WEDGE RESECTION;  Surgeon: HMelrose Nakayama MD;  [place]  Service: Thoracic;  Laterality: Right;    Home Medications:  Allergies as of 08/26/2022       Reactions   Lovenox [enoxaparin]    HIT panel ordered 10/10        Medication List        Accurate as of August 25, 2022  5:56 PM. If you have any questions, ask your nurse or doctor.          alfuzosin 10 MG 24 hr tablet Commonly known as: UROXATRAL Take 1 tablet (10 mg total) by mouth at bedtime.   amLODipine 10 MG tablet Commonly known as: NORVASC Take 1 tablet (10 mg total) by mouth daily.   apixaban 5 MG Tabs tablet Commonly known as: Eliquis Take 1 tablet (5 mg total) by mouth 2 (two) times daily.   atorvastatin 40 MG tablet Commonly known as: LIPITOR Take 1 tablet (40 mg total) by mouth daily.   cholecalciferol 1000 units tablet Commonly known as: VITAMIN D Take 1 tablet  (1,000 Units total) by mouth daily.   ferrous gluconate 324 MG tablet Commonly known as: FERGON TAKE 1 TABLET 2 TIMES A DAY WITH A MEAL   Fish Oil 1200 MG Caps Take 2,400 mg by mouth daily.   levothyroxine 88 MCG tablet Commonly known as: SYNTHROID Take 1 tablet (88 mcg total) by mouth daily.   lisinopril 40 MG tablet Commonly known as: ZESTRIL Take 1 tablet (40 mg total) by mouth daily.   metoprolol succinate 25 MG 24 hr tablet Commonly known as: TOPROL-XL Take 0.5 tablets (12.5 mg total) by mouth daily.   multivitamin with minerals Tabs tablet Take 1 tablet by mouth daily. One A Day Multivitamin for Men   pantoprazole 40 MG tablet Commonly known as: PROTONIX TAKE  (1)  TABLET TWICE A DAY.        Allergies:  Allergies  Allergen Reactions   Lovenox [Enoxaparin]     HIT panel ordered 10/10    Family History  Problem Relation Age of Onset   Heart disease Father        Heart Disease before age 83  Alcohol abuse Father    Heart attack Father    Cancer Mother        Lung   COPD Mother    COPD Sister    Lupus Sister  Hypertension Brother    Gout Brother    Heart attack Son    Gout Brother    Prostatitis Brother     Social History:  reports that he quit smoking about 16 years ago. His smoking use included cigarettes. He started smoking about 67 years ago. He has a 51.00 pack-year smoking history. He has never used smokeless tobacco. He reports that he does not drink alcohol and does not use drugs.  ROS: A complete review of systems was performed.  All systems are negative except for pertinent findings as noted.  Physical Exam:  Vital signs in last 24 hours: There were no vitals taken for this visit. Constitutional:  Alert and oriented, No acute distress Cardiovascular: Regular rate  Respiratory: Normal respiratory effort GI: Abdomen is soft, nontender, nondistended, no abdominal masses. No CVAT.  Genitourinary: Normal male phallus, testes are descended  bilaterally and non-tender and without masses, scrotum is normal in appearance without lesions or masses, perineum is normal on inspection. Lymphatic: No lymphadenopathy Neurologic: Grossly intact, no focal deficits Psychiatric: Normal mood and affect  .cysto  I have reviewed prior pt notes  I have reviewed urinalysis results  I have independently reviewed prior pathology  I have reviewed prior urine culture, cytology   Impression/Assessment:  ***  Plan:  ***

## 2022-08-26 ENCOUNTER — Ambulatory Visit: Payer: Medicare Other | Admitting: Urology

## 2022-08-26 ENCOUNTER — Encounter: Payer: Self-pay | Admitting: Urology

## 2022-08-26 VITALS — BP 122/68 | HR 80

## 2022-08-26 DIAGNOSIS — Z8551 Personal history of malignant neoplasm of bladder: Secondary | ICD-10-CM

## 2022-08-26 DIAGNOSIS — R8289 Other abnormal findings on cytological and histological examination of urine: Secondary | ICD-10-CM | POA: Diagnosis not present

## 2022-08-26 LAB — MICROSCOPIC EXAMINATION: WBC, UA: >30 /hpf — AB (ref 0–5)

## 2022-08-26 LAB — URINALYSIS, ROUTINE W REFLEX MICROSCOPIC
Bilirubin, UA: NEGATIVE
Glucose, UA: NEGATIVE
Nitrite, UA: NEGATIVE
Specific Gravity, UA: 1.02 (ref 1.005–1.030)
Urobilinogen, Ur: 1 mg/dL (ref 0.2–1.0)
pH, UA: 5.5 (ref 5.0–7.5)

## 2022-08-26 MED ORDER — CIPROFLOXACIN HCL 500 MG PO TABS
500.0000 mg | ORAL_TABLET | Freq: Once | ORAL | Status: AC
  Administered 2022-08-26: 500 mg via ORAL

## 2022-08-28 LAB — CYTOLOGY - NON PAP

## 2022-09-01 ENCOUNTER — Telehealth: Payer: Self-pay

## 2022-09-01 NOTE — Telephone Encounter (Signed)
-----   Message from Franchot Gallo, MD sent at 08/29/2022 12:54 PM EST ----- Let pt know that only atypical cells seen on cytology--no cancer cells. Followup as scheduled ----- Message ----- From: Sherrilyn Rist, CMA Sent: 08/28/2022   3:41 PM EST To: Franchot Gallo, MD  Please review

## 2022-09-01 NOTE — Telephone Encounter (Signed)
Left vm for return call

## 2022-09-01 NOTE — Telephone Encounter (Signed)
Patient aware of MD response and will f/u as scheduled.  

## 2022-09-15 ENCOUNTER — Other Ambulatory Visit: Payer: Self-pay | Admitting: *Deleted

## 2022-09-15 DIAGNOSIS — I6529 Occlusion and stenosis of unspecified carotid artery: Secondary | ICD-10-CM

## 2022-09-15 DIAGNOSIS — Z95828 Presence of other vascular implants and grafts: Secondary | ICD-10-CM

## 2022-09-29 ENCOUNTER — Ambulatory Visit (INDEPENDENT_AMBULATORY_CARE_PROVIDER_SITE_OTHER)
Admission: RE | Admit: 2022-09-29 | Discharge: 2022-09-29 | Disposition: A | Discharge status: Home / Self Care | Payer: Medicare Other | Source: Ambulatory Visit | Attending: Surgery | Admitting: Surgery

## 2022-09-29 ENCOUNTER — Ambulatory Visit (HOSPITAL_COMMUNITY)
Admission: RE | Admit: 2022-09-29 | Discharge: 2022-09-29 | Disposition: A | Discharge status: Home / Self Care | Payer: Medicare Other | Source: Ambulatory Visit | Attending: Surgery | Admitting: Surgery

## 2022-09-29 ENCOUNTER — Ambulatory Visit: Payer: Medicare Other | Admitting: Physician Assistant

## 2022-09-29 VITALS — BP 136/73 | HR 65 | Temp 97.6°F | Resp 16 | Ht 69.5 in | Wt 164.0 lb

## 2022-09-29 DIAGNOSIS — Z95828 Presence of other vascular implants and grafts: Secondary | ICD-10-CM

## 2022-09-29 DIAGNOSIS — I6529 Occlusion and stenosis of unspecified carotid artery: Secondary | ICD-10-CM

## 2022-09-29 NOTE — Progress Notes (Signed)
[facility] [place] HISTORY AND PHYSICAL   History of Present Illness:  Patient is a 82 y.o. year old male who presents for evaluation of abdominal aortic aneurysm.    He has a history of abdominal aortic aneurysm status post EVAR 2011.   He denies claudication, rest pain or non healing wounds.  He denies lumbar or abdominal pain.     He is also followed for carotid stenosis with no history of symptoms for stroke or TIA.  His last duplex showed < 39 % B ICA stenosis. He denies symptoms of stroke, amaurosis, weakness or aphasia.     He has had no change in his health since he was seen a year ago.  He stays busy and active.  He is independent.      The pt is on a statin for cholesterol management.  The pt is not on a daily aspirin.   Other AC: Eliquis The pt is on Norvasc, Zestril for hypertension.   The pt is not diabetic.   Tobacco hx: Former smoker, quit 2007  History of TAVR 2018.  History of lumbar spine surgery     Past Medical History:  Diagnosis Date   AAA (abdominal aortic aneurysm)    Aortic stenosis, severe    a. 02/2017: s/p TAVR with Edwards Sapien 3 THV (size 26 mm, model # 9600TFX, serial # [id number])   Arthritis    Atherosclerotic peripheral vascular disease    Atrial fibrillation, persistent    a. not a candidate for OAC given hx of GI bleed   Bilateral carotid artery stenosis    Bladder cancer    COPD (chronic obstructive pulmonary disease)    Gastroesophageal reflux disease without esophagitis 05/25/2019   Hyperlipidemia    Hypertension    Hypothyroidism    Lung cancer    Right   Lung mass    Right   Skin cancer    Vitamin D deficiency     Past Surgical History:  Procedure Laterality Date   ABDOMINAL AORTIC ANEURYSM REPAIR  08/09/2009   BACK SURGERY     x2   CATARACT EXTRACTION BILATERAL W/ ANTERIOR VITRECTOMY Bilateral 2022   CYSTOSCOPY W/ RETROGRADES Bilateral 09/02/2018   Procedure: CYSTOSCOPY WITH RETROGRADE PYELOGRAM;   Surgeon: Marcine Matar, MD;  Location: [facility];  Service: Urology;  Laterality: Bilateral;   CYSTOSCOPY W/ RETROGRADES Bilateral 03/20/2020   Procedure: CYSTOSCOPY WITH BILATERAL RETROGRADE PYELOGRAM;  Surgeon: Marcine Matar, MD;  Location: [facility];  Service: Urology;  Laterality: Bilateral;   CYSTOSCOPY WITH BIOPSY N/A 09/02/2018   Procedure: CYSTOSCOPY WITH BIOPSY;  Surgeon: Marcine Matar, MD;  Location: [facility];  Service: Urology;  Laterality: N/A;   ESOPHAGOGASTRODUODENOSCOPY N/A 02/21/2017   Procedure: ESOPHAGOGASTRODUODENOSCOPY (EGD);  Surgeon: Beverley Fiedler, MD;  Location: [facility];  Service: Gastroenterology;  Laterality: N/A;   HYDROCELE EXCISION / REPAIR     LOBECTOMY Right 03/30/2017   Procedure: RIGHT UPPER LOBECTOMY LUNG;  Surgeon: Loreli Slot, MD;  Location: [facility] OR;  Service: Thoracic;  Laterality: Right;   SKIN CANCER EXCISION     SPINE SURGERY     TEE WITHOUT CARDIOVERSION N/A 02/24/2017   Procedure: TRANSESOPHAGEAL ECHOCARDIOGRAM (TEE);  Surgeon: Tonny Bollman, MD;  Location: [facility] OR;  Service: Open Heart Surgery;  Laterality: N/A;   TRANSCATHETER AORTIC VALVE REPLACEMENT, TRANSFEMORAL N/A 02/24/2017   Procedure: TRANSCATHETER AORTIC VALVE REPLACEMENT, TRANSFEMORAL using a 26mm Edwards Sapien 3 Aortic Valve;  Surgeon: Tonny Bollman, MD;  Location: [facility] OR;  Service: Open Heart Surgery;  Laterality: N/A;   TRANSURETHRAL RESECTION OF BLADDER TUMOR N/A 06/06/2016   Procedure: TRANSURETHRAL RESECTION OF BLADDER TUMOR (TURBT);  Surgeon: Marcine MatarStephen Dahlstedt, MD;  Location: [facility];  Service: Urology;  Laterality: N/A;   TRANSURETHRAL RESECTION OF BLADDER TUMOR N/A 07/22/2016   Procedure: TRANSURETHRAL RESECTION OF BLADDER TUMOR (TURBT);  Surgeon: Marcine MatarStephen Dahlstedt, MD;  Location: [facility];  Service: Urology;  Laterality: N/A;   TRANSURETHRAL RESECTION OF BLADDER TUMOR N/A 03/20/2020   Procedure: TRANSURETHRAL RESECTION OF  BLADDER TUMOR (TURBT);  Surgeon: Marcine Matarahlstedt, Stephen, MD;  Location: [facility];  Service: Urology;  Laterality: N/A;   VIDEO ASSISTED THORACOSCOPY (VATS)/WEDGE RESECTION Right 03/30/2017   Procedure: VIDEO ASSISTED THORACOSCOPY (VATS)/WEDGE RESECTION;  Surgeon: Loreli SlotHendrickson, Steven C, MD;  Location: [facility];  Service: Thoracic;  Laterality: Right;     Social History Social History   Tobacco Use   Smoking status: Former    Packs/day: 1.00    Years: 51.00    Additional pack years: 0.00    Total pack years: 51.00    Types: Cigarettes    Start date: 11/25/1954    Quit date: 10/27/2005    Years since quitting: 16.9   Smokeless tobacco: Never  Vaping Use   Vaping Use: Never used  Substance Use Topics   Alcohol use: No    Alcohol/week: 0.0 standard drinks of alcohol    Comment: none since 1999   Drug use: No    Family History Family History  Problem Relation Age of Onset   Heart disease Father        Heart Disease before age 83   Alcohol abuse Father    Heart attack Father    Cancer Mother        Lung   COPD Mother    COPD Sister    Lupus Sister    Hypertension Brother    Gout Brother    Heart attack Son    Gout Brother    Prostatitis Brother     Allergies  Allergies  Allergen Reactions   Lovenox [Enoxaparin]     HIT panel ordered 10/10     Current Outpatient Medications  Medication Sig Dispense Refill   alfuzosin (UROXATRAL) 10 MG 24 hr tablet Take 1 tablet (10 mg total) by mouth at bedtime. 90 tablet 1   amLODipine (NORVASC) 10 MG tablet Take 1 tablet (10 mg total) by mouth daily. 90 tablet 1   apixaban (ELIQUIS) 5 MG TABS tablet Take 1 tablet (5 mg total) by mouth 2 (two) times daily. 60 tablet 6   atorvastatin (LIPITOR) 40 MG tablet Take 1 tablet (40 mg total) by mouth daily. 90 tablet 1   cholecalciferol (VITAMIN D) 1000 UNITS tablet Take 1 tablet (1,000 Units total) by mouth daily. 30 tablet 6   ferrous gluconate (FERGON) 324 MG tablet TAKE 1 TABLET 2 TIMES A DAY  WITH A MEAL 180 tablet 1   levothyroxine (SYNTHROID) 88 MCG tablet Take 1 tablet (88 mcg total) by mouth daily. 90 tablet 1   lisinopril (ZESTRIL) 40 MG tablet Take 1 tablet (40 mg total) by mouth daily. 90 tablet 1   metoprolol succinate (TOPROL-XL) 25 MG 24 hr tablet Take 0.5 tablets (12.5 mg total) by mouth daily. 45 tablet 1   Multiple Vitamin (MULTIVITAMIN WITH MINERALS) TABS tablet Take 1 tablet by mouth daily. One A Day Multivitamin for Men     Omega-3 Fatty Acids (FISH OIL) 1200 MG CAPS Take  2,400 mg by mouth daily.      pantoprazole (PROTONIX) 40 MG tablet TAKE  (1)  TABLET TWICE A DAY. 180 tablet 1   No current facility-administered medications for this visit.    ROS:   General:  No weight loss, Fever, chills  HEENT: No recent headaches, no nasal bleeding, no visual changes, no sore throat  Neurologic: No dizziness, blackouts, seizures. No recent symptoms of stroke or mini- stroke. No recent episodes of slurred speech, or temporary blindness.  Cardiac: No recent episodes of chest pain/pressure, no shortness of breath at rest.  No shortness of breath with exertion.  Denies history of atrial fibrillation or irregular heartbeat  Vascular: No history of rest pain in feet.  No history of claudication.  No history of non-healing ulcer, No history of DVT   Pulmonary: No home oxygen, no productive cough, no hemoptysis,  No asthma or wheezing  Musculoskeletal:  [x ] Arthritis, [ ]  Low back pain,  [ ]  Joint pain  Hematologic:No history of hypercoagulable state.  No history of easy bleeding.  No history of anemia  Gastrointestinal: No hematochezia or melena,  No gastroesophageal reflux, no trouble swallowing  Urinary: [ ]  chronic Kidney disease, [ ]  on HD - [ ]  MWF or [ ]  TTHS, [ ]  Burning with urination, [ ]  Frequent urination, [ ]  Difficulty urinating;   Skin: No rashes  Psychological: No history of anxiety,  No history of depression   Physical Examination  Vitals:    09/29/22 0854  BP: 132/82  Pulse: 65  Resp: 16  Temp: 97.6 F (36.4 C)  TempSrc: Temporal  SpO2: 96%  Weight: 164 lb (74.4 kg)  Height: 5' 9.5" (1.765 m)    Body mass index is 23.87 kg/m.  General:  Alert and oriented, no acute distress HEENT: Normal Neck: No bruit or JVD Pulmonary: Clear to auscultation bilaterally Cardiac: Regular Rate and Rhythm without murmur Gastrointestinal: Soft, non-tender, non-distended, no mass, no scars Skin: No rash Extremity:   well perfused extremities   Musculoskeletal: No deformity or edema  Neurologic: Upper and lower extremity motor 5/5 and symmetric  DATA:  Right Carotid Findings:  +----------+--------+--------+--------+-------------------------+--------+           PSV cm/sEDV cm/sStenosisPlaque Description       Comments  +----------+--------+--------+--------+-------------------------+--------+  CCA Prox  111     14                                                 +----------+--------+--------+--------+-------------------------+--------+  CCA Mid   64      11                                                 +----------+--------+--------+--------+-------------------------+--------+  CCA Distal62      16                                                 +----------+--------+--------+--------+-------------------------+--------+  ICA Prox  121     25      1-39%   heterogenous and calcific          +----------+--------+--------+--------+-------------------------+--------+  ICA Mid   100     18                                                 +----------+--------+--------+--------+-------------------------+--------+  ICA Distal62      17                                                 +----------+--------+--------+--------+-------------------------+--------+  ECA      64      9               heterogenous                        +----------+--------+--------+--------+-------------------------+--------+   +----------+--------+-------+----------------+-------------------+           PSV cm/sEDV cmsDescribe        Arm Pressure (mmHG)  +----------+--------+-------+----------------+-------------------+  [id number]    0      Multiphasic, [id number]                  +----------+--------+-------+----------------+-------------------+   +---------+--------+--+--------+-+---------+  VertebralPSV cm/s39EDV cm/s7Antegrade  +---------+--------+--+--------+-+---------+      Left Carotid Findings:  +----------+--------+--------+--------+------------------+--------+           PSV cm/sEDV cm/sStenosisPlaque DescriptionComments  +----------+--------+--------+--------+------------------+--------+  CCA Prox  84      12              heterogenous                +----------+--------+--------+--------+------------------+--------+  CCA Mid   79      13              heterogenous                +----------+--------+--------+--------+------------------+--------+  CCA Distal54      12              heterogenous                +----------+--------+--------+--------+------------------+--------+  ICA Prox  52      15      1-39%                               +----------+--------+--------+--------+------------------+--------+  ICA Mid   63      19                                          +----------+--------+--------+--------+------------------+--------+  ICA Distal55      14                                          +----------+--------+--------+--------+------------------+--------+  ECA      96      7                                           +----------+--------+--------+--------+------------------+--------+   +----------+--------+--------+----------------+-------------------+  PSV cm/sEDV cm/sDescribe        Arm Pressure (mmHG)   +----------+--------+--------+----------------+-------------------+  [id number]            Multiphasic, [id number]                  +----------+--------+--------+----------------+-------------------+   +---------+--------+--+--------+--+---------+  VertebralPSV cm/s57EDV cm/s14Antegrade  +---------+--------+--+--------+--+---------+       Summary:  Right Carotid: Velocities in the right ICA are consistent with a 1-39%  stenosis.   Left Carotid: Velocities in the left ICA are consistent with a 1-39%  stenosis.   Vertebrals: Bilateral vertebral arteries demonstrate antegrade flow.  Subclavians: Normal flow hemodynamics were seen in bilateral subclavian               arteries.    Endovascular Aortic Repair (EVAR):  +----------+----------------+-------------------+-------------------+           Diameter AP (cm)Diameter Trans (cm)Velocities (cm/sec)  +----------+----------------+-------------------+-------------------+  Aorta    4.68            4.40               53                   +----------+----------------+-------------------+-------------------+  Right Limb1.15            1.20               90                   +----------+----------------+-------------------+-------------------+  Left Limb 1.17            1.24               97                   +----------+----------------+-------------------+-------------------+      Summary:  Abdominal Aorta: Patent endovascular aneurysm repair with no evidence of  endoleak. See above for comparison.    ASSESSMENT/PLAN:  Patient is a 82 y.o. year old male who presents for evaluation of abdominal aortic aneurysm.    He has a history of abdominal aortic aneurysm status post EVAR 2011.  The duplex shows an increase in size from 3.9 to 4.68 without evidence of endo leak.  He denies lumbar or abdominal pain.  He will return in 6 months for repeat EVAR Duplex.  If the AAA sac increases to 5.0 or > we will  order a CTA.    He is also followed for ICA stenosis.   He was seen 2 years ago with <39% B ICA's.  He maintains that he is asymptomatic for stroke symptoms of amaurosis, weakness or aphasia.   Carotid duplex demonstrated < 39 % stenosis.  We can repeat the duplex in 2-3 years.      Mosetta Pigeon PA-C [facility] [place] Office: [phone]  MD in clinic [place]

## 2022-10-02 ENCOUNTER — Other Ambulatory Visit: Payer: Self-pay

## 2022-10-02 DIAGNOSIS — Z95828 Presence of other vascular implants and grafts: Secondary | ICD-10-CM

## 2022-10-11 DIAGNOSIS — H10233 Serous conjunctivitis, except viral, bilateral: Secondary | ICD-10-CM | POA: Diagnosis not present

## 2022-10-11 DIAGNOSIS — R0609 Other forms of dyspnea: Secondary | ICD-10-CM | POA: Diagnosis not present

## 2022-10-11 DIAGNOSIS — J441 Chronic obstructive pulmonary disease with (acute) exacerbation: Secondary | ICD-10-CM | POA: Diagnosis not present

## 2022-10-11 DIAGNOSIS — R0602 Shortness of breath: Secondary | ICD-10-CM | POA: Diagnosis not present

## 2022-10-11 DIAGNOSIS — R051 Acute cough: Secondary | ICD-10-CM | POA: Diagnosis not present

## 2022-11-23 NOTE — Progress Notes (Unsigned)
Cardiology Office Note:   Date:  11/26/2022  ID:  [name] [name], DOB 1941/05/03, MRN [id number] PCP: [name], FNP  [facility] Providers Cardiologist:  None    History of Present Illness:   [name] [name] is a 82 y.o. male  with a history of TAVR, CAD, permanent atrial fibrillation, HLD, PVD, history of aneurysm repair of abdominal aorta using endovascular graft.  History of lung CA right lung.  History of recurrent bladder cancer with TURBT, history of smoking, COPD, HTN.   Since I last saw him he has done well. The patient denies any new symptoms such as chest discomfort, neck or arm discomfort. There has been no new shortness of breath, PND or orthopnea. There have been no reported palpitations, presyncope or syncope.  He gets a little short of breath if he hurries but is able to do yard work without complaints.  .  ROS:   He has had sinus problems no problems with pollen.  Otherwise  as stated in the HPI and negative for all other systems.  Studies Reviewed:    EKG: Atrial fibrillation, rate 54, axis within normal limits, intervals within normal limits, low voltage in the limb and chest leads, poor anterior R wave progression, nonspecific T wave flattening.  No change from previous   Risk Assessment/Calculations:    CHA2DS2-VASc Score = 3   This indicates a 3.2% annual risk of stroke. The patient's score is based upon: CHF History: 0 HTN History: 0 Diabetes History: 0 Stroke History: 0 Vascular Disease History: 1 Age Score: 2 Gender Score: 0       Physical Exam:   VS:  BP 122/64   Pulse (!) 54   Ht 5' 9.5" (1.765 m)   Wt 164 lb (74.4 kg)   BMI 23.87 kg/m    Wt Readings from Last 3 Encounters:  11/26/22 164 lb (74.4 kg)  09/29/22 164 lb (74.4 kg)  06/09/22 169 lb (76.7 kg)     GEN: Well nourished, well developed in no acute distress NECK: No JVD; bilateral carotid carotid bruits CARDIAC  Irregular RR, soft apical murmur murmurs, rubs,  gallops RESPIRATORY:  Clear to auscultation without rales, wheezing or rhonchi  ABDOMEN: Soft, non-tender, non-distended EXTREMITIES:  Bilateral ankle edema; No deformity   ASSESSMENT AND PLAN:   Nonrheumatic aortic valve stenosis:    He is status post Edwards APN 326 mm bioprosthetic TAVR.  This was normal on echo in 2023.  At this point I think his exam does not suggest any change in his valve status and I do not need to image it this year.  He understands endocarditis prophylaxis.  Permanent atrial fibrillation [facility]):   He tolerates anticoagulation.  Is on appropriate dose of anticoagulation.  He is up-to-date with blood work.  He does not feel his fibrillation.  No change in therapy.  He has had good rate control.  CAD in native artery:    The patient has no new sypmtoms.  No further cardiovascular testing is indicated.  We will continue with aggressive risk reduction and meds as listed.  I do see the recent CT that documented some coronary calcium and aortic atherosclerosis.  We continue with risk reduction.   Mixed hyperlipidemia: LDL was 58.  No change in therapy.    PVD (peripheral vascular disease) [facility]): He is status post EVAR.   He has had abdominal and carotid ultrasounds.  He is followed by [facility].  I reviewed these records for this visit.  Signed, Rollene Rotunda, MD

## 2022-11-26 ENCOUNTER — Ambulatory Visit (INDEPENDENT_AMBULATORY_CARE_PROVIDER_SITE_OTHER): Payer: Medicare Other | Admitting: Cardiology

## 2022-11-26 ENCOUNTER — Encounter: Payer: Self-pay | Admitting: Cardiology

## 2022-11-26 VITALS — BP 122/64 | HR 54 | Ht 69.5 in | Wt 164.0 lb

## 2022-11-26 DIAGNOSIS — I25118 Atherosclerotic heart disease of native coronary artery with other forms of angina pectoris: Secondary | ICD-10-CM | POA: Diagnosis not present

## 2022-11-26 DIAGNOSIS — E785 Hyperlipidemia, unspecified: Secondary | ICD-10-CM | POA: Diagnosis not present

## 2022-11-26 DIAGNOSIS — Z952 Presence of prosthetic heart valve: Secondary | ICD-10-CM

## 2022-11-26 NOTE — Patient Instructions (Signed)
Medication Instructions:  The current medical regimen is effective;  continue present plan and medications.  *If you need a refill on your cardiac medications before your next appointment, please call your pharmacy*  Follow-Up: At [facility], you and your health needs are our priority.  As part of our continuing mission to provide you with exceptional heart care, we have created designated Provider Care Teams.  These Care Teams include your primary Cardiologist (physician) and Advanced Practice Providers (APPs -  Physician Assistants and Nurse Practitioners) who all work together to provide you with the care you need, when you need it.  We recommend signing up for the patient portal called "MyChart".  Sign up information is provided on this After Visit Summary.  MyChart is used to connect with patients for Virtual Visits (Telemedicine).  Patients are able to view lab/test results, encounter notes, upcoming appointments, etc.  Non-urgent messages can be sent to your provider as well.   To learn more about what you can do with MyChart, go to [email or web address].    Your next appointment:   1 year(s)  Provider:   James Hochrein, MD     

## 2022-12-10 ENCOUNTER — Other Ambulatory Visit: Payer: Self-pay | Admitting: Nurse Practitioner

## 2022-12-10 DIAGNOSIS — E782 Mixed hyperlipidemia: Secondary | ICD-10-CM

## 2022-12-10 DIAGNOSIS — N400 Enlarged prostate without lower urinary tract symptoms: Secondary | ICD-10-CM

## 2022-12-10 DIAGNOSIS — E034 Atrophy of thyroid (acquired): Secondary | ICD-10-CM

## 2022-12-10 DIAGNOSIS — K219 Gastro-esophageal reflux disease without esophagitis: Secondary | ICD-10-CM

## 2022-12-10 DIAGNOSIS — I25118 Atherosclerotic heart disease of native coronary artery with other forms of angina pectoris: Secondary | ICD-10-CM

## 2022-12-10 DIAGNOSIS — I10 Essential (primary) hypertension: Secondary | ICD-10-CM

## 2022-12-11 ENCOUNTER — Encounter: Payer: Self-pay | Admitting: Nurse Practitioner

## 2022-12-11 ENCOUNTER — Ambulatory Visit (INDEPENDENT_AMBULATORY_CARE_PROVIDER_SITE_OTHER): Payer: Medicare Other | Admitting: Nurse Practitioner

## 2022-12-11 VITALS — BP 128/63 | HR 90 | Temp 98.0°F | Resp 20 | Ht 69.0 in | Wt 166.0 lb

## 2022-12-11 DIAGNOSIS — I25118 Atherosclerotic heart disease of native coronary artery with other forms of angina pectoris: Secondary | ICD-10-CM | POA: Diagnosis not present

## 2022-12-11 DIAGNOSIS — C3491 Malignant neoplasm of unspecified part of right bronchus or lung: Secondary | ICD-10-CM | POA: Diagnosis not present

## 2022-12-11 DIAGNOSIS — K219 Gastro-esophageal reflux disease without esophagitis: Secondary | ICD-10-CM

## 2022-12-11 DIAGNOSIS — I4821 Permanent atrial fibrillation: Secondary | ICD-10-CM

## 2022-12-11 DIAGNOSIS — J449 Chronic obstructive pulmonary disease, unspecified: Secondary | ICD-10-CM

## 2022-12-11 DIAGNOSIS — I6523 Occlusion and stenosis of bilateral carotid arteries: Secondary | ICD-10-CM | POA: Diagnosis not present

## 2022-12-11 DIAGNOSIS — E034 Atrophy of thyroid (acquired): Secondary | ICD-10-CM | POA: Diagnosis not present

## 2022-12-11 DIAGNOSIS — I350 Nonrheumatic aortic (valve) stenosis: Secondary | ICD-10-CM | POA: Diagnosis not present

## 2022-12-11 DIAGNOSIS — I739 Peripheral vascular disease, unspecified: Secondary | ICD-10-CM | POA: Diagnosis not present

## 2022-12-11 DIAGNOSIS — E559 Vitamin D deficiency, unspecified: Secondary | ICD-10-CM | POA: Diagnosis not present

## 2022-12-11 DIAGNOSIS — N400 Enlarged prostate without lower urinary tract symptoms: Secondary | ICD-10-CM

## 2022-12-11 DIAGNOSIS — E782 Mixed hyperlipidemia: Secondary | ICD-10-CM

## 2022-12-11 DIAGNOSIS — I10 Essential (primary) hypertension: Secondary | ICD-10-CM | POA: Diagnosis not present

## 2022-12-11 DIAGNOSIS — Z6826 Body mass index (BMI) 26.0-26.9, adult: Secondary | ICD-10-CM

## 2022-12-11 LAB — CBC WITH DIFFERENTIAL/PLATELET
Hemoglobin: 11.4 g/dL — ABNORMAL LOW (ref 13.0–17.7)
Immature Grans (Abs): 0 10*3/uL (ref 0.0–0.1)
Immature Granulocytes: 0 %
Lymphocytes Absolute: 1.3 10*3/uL (ref 0.7–3.1)
MCV: 92 fL (ref 79–97)
Neutrophils Absolute: 3.6 10*3/uL (ref 1.4–7.0)
Platelets: 157 10*3/uL (ref 150–450)

## 2022-12-11 LAB — CMP14+EGFR
Alkaline Phosphatase: 85 IU/L (ref 44–121)
BUN: 19 mg/dL (ref 8–27)
Bilirubin Total: 0.4 mg/dL (ref 0.0–1.2)
Chloride: 106 mmol/L (ref 96–106)
Creatinine, Ser: 1.18 mg/dL (ref 0.76–1.27)

## 2022-12-11 LAB — THYROID PANEL WITH TSH

## 2022-12-11 MED ORDER — ATORVASTATIN CALCIUM 40 MG PO TABS: 40.0000 mg | ORAL_TABLET | Freq: Every day | ORAL | 1 refills | Status: DC

## 2022-12-11 MED ORDER — FERROUS GLUCONATE 324 (38 FE) MG PO TABS: ORAL_TABLET | ORAL | 1 refills | Status: DC

## 2022-12-11 MED ORDER — PANTOPRAZOLE SODIUM 40 MG PO TBEC
DELAYED_RELEASE_TABLET | ORAL | 1 refills | Status: DC
Start: 2022-12-11 — End: 2023-09-08

## 2022-12-11 MED ORDER — LEVOTHYROXINE SODIUM 88 MCG PO TABS
88.0000 ug | ORAL_TABLET | Freq: Every day | ORAL | 1 refills | Status: DC
Start: 2022-12-11 — End: 2023-09-08

## 2022-12-11 MED ORDER — APIXABAN 5 MG PO TABS
5.0000 mg | ORAL_TABLET | Freq: Two times a day (BID) | ORAL | 1 refills | Status: DC
Start: 2022-12-11 — End: 2023-09-29

## 2022-12-11 MED ORDER — AMLODIPINE BESYLATE 10 MG PO TABS
10.0000 mg | ORAL_TABLET | Freq: Every day | ORAL | 1 refills | Status: DC
Start: 2022-12-11 — End: 2023-08-24

## 2022-12-11 MED ORDER — LISINOPRIL 40 MG PO TABS: 40.0000 mg | ORAL_TABLET | Freq: Every day | ORAL | 1 refills | Status: DC

## 2022-12-11 MED ORDER — ALFUZOSIN HCL ER 10 MG PO TB24: 10.0000 mg | ORAL_TABLET | Freq: Every day | ORAL | 1 refills | Status: DC

## 2022-12-11 MED ORDER — METOPROLOL SUCCINATE ER 25 MG PO TB24
12.5000 mg | ORAL_TABLET | Freq: Every day | ORAL | 1 refills | Status: DC
Start: 2022-12-11 — End: 2023-09-08

## 2022-12-11 NOTE — Patient Instructions (Signed)
Exercising to Stay Healthy To become healthy and stay healthy, it is recommended that you do moderate-intensity and vigorous-intensity exercise. You can tell that you are exercising at a moderate intensity if your heart starts beating faster and you start breathing faster but can still hold a conversation. You can tell that you are exercising at a vigorous intensity if you are breathing much harder and faster and cannot hold a conversation while exercising. How can exercise benefit me? Exercising regularly is important. It has many health benefits, such as: Improving overall fitness, flexibility, and endurance. Increasing bone density. Helping with weight control. Decreasing body fat. Increasing muscle strength and endurance. Reducing stress and tension, anxiety, depression, or anger. Improving overall health. What guidelines should I follow while exercising? Before you start a new exercise program, talk with your health care provider. Do not exercise so much that you hurt yourself, feel dizzy, or get very short of breath. Wear comfortable clothes and wear shoes with good support. Drink plenty of water while you exercise to prevent dehydration or heat stroke. Work out until your breathing and your heartbeat get faster (moderate intensity). How often should I exercise? Choose an activity that you enjoy, and set realistic goals. Your health care provider can help you make an activity plan that is individually designed and works best for you. Exercise regularly as told by your health care provider. This may include: Doing strength training two times a week, such as: Lifting weights. Using resistance bands. Push-ups. Sit-ups. Yoga. Doing a certain intensity of exercise for a given amount of time. Choose from these options: A total of 150 minutes of moderate-intensity exercise every week. A total of 75 minutes of vigorous-intensity exercise every week. A mix of moderate-intensity and  vigorous-intensity exercise every week. Children, pregnant women, people who have not exercised regularly, people who are overweight, and older adults may need to talk with a health care provider about what activities are safe to perform. If you have a medical condition, be sure to talk with your health care provider before you start a new exercise program. What are some exercise ideas? Moderate-intensity exercise ideas include: Walking 1 mile (1.6 km) in about 15 minutes. Biking. Hiking. Golfing. Dancing. Water aerobics. Vigorous-intensity exercise ideas include: Walking 4.5 miles (7.2 km) or more in about 1 hour. Jogging or running 5 miles (8 km) in about 1 hour. Biking 10 miles (16.1 km) or more in about 1 hour. Lap swimming. Roller-skating or in-line skating. Cross-country skiing. Vigorous competitive sports, such as football, basketball, and soccer. Jumping rope. Aerobic dancing. What are some everyday activities that can help me get exercise? Yard work, such as: Pushing a lawn mower. Raking and bagging leaves. Washing your car. Pushing a stroller. Shoveling snow. Gardening. Washing windows or floors. How can I be more active in my day-to-day activities? Use stairs instead of an elevator. Take a walk during your lunch break. If you drive, park your car farther away from your work or school. If you take public transportation, get off one stop early and walk the rest of the way. Stand up or walk around during all of your indoor phone calls. Get up, stretch, and walk around every 30 minutes throughout the day. Enjoy exercise with a friend. Support to continue exercising will help you keep a regular routine of activity. Where to find more information You can find more information about exercising to stay healthy from: U.S. Department of Health and Human Services: www.hhs.gov Centers for Disease Control and Prevention (  CDC): www.cdc.gov Summary Exercising regularly is  important. It will improve your overall fitness, flexibility, and endurance. Regular exercise will also improve your overall health. It can help you control your weight, reduce stress, and improve your bone density. Do not exercise so much that you hurt yourself, feel dizzy, or get very short of breath. Before you start a new exercise program, talk with your health care provider. This information is not intended to replace advice given to you by your health care provider. Make sure you discuss any questions you have with your health care provider. Document Revised: 10/05/2020 Document Reviewed: 10/05/2020 Elsevier Patient Education  2024 [employer]  

## 2022-12-11 NOTE — Progress Notes (Signed)
Subjective:    Patient ID: [name] [name], male    DOB: 11/27/1940, 82 y.o.   MRN: [id number]   Chief Complaint: medical management of chronic issues     HPI:  [name] [name] is a 82 y.o. who identifies as a male who was assigned male at birth.   Social history: Lives with: wife Work history: retired from [employer] in today for follow up of the following chronic medical issues:  1. Primary hypertension No c/o chest pain, sob or headache. Doe snot check blood pressure at home. BP Readings from Last 3 Encounters:  11/26/22 122/64  09/29/22 136/73  08/26/22 122/68    2. Nonrheumatic aortic valve stenosis 3. Bilateral carotid artery stenosis 4. PVD (peripheral vascular disease) (HCC) 5. Permanent atrial fibrillation (HCC) 6. Coronary artery disease of native artery of native heart with stable angina pectoris [facility]) Last saw cardiology on 11/26/22. Review of office note showed no change in plan of care. He is to follow up in one year.  7. Chronic obstructive pulmonary disease, unspecified COPD type (HCC) Says he is having no breathing issues. Is currently on no inhalers  8. Primary lung cancer, right (HCC) Was in 2019 and he had surgery and is doing well. No cancer reoccurence.  9. Gastroesophageal reflux disease without esophagitis Takes protonix daily and is doing well.  10. Hypothyroidism due to acquired atrophy of thyroid No issues that he is aware of. Lab Results  Component Value Date   TSH 3.330 06/09/2022     11. Mixed hyperlipidemia Does watch diet and stays very active Lab Results  Component Value Date   CHOL 100 06/09/2022   HDL 29 (L) 06/09/2022   LDLCALC 50 06/09/2022   TRIG 112 06/09/2022   CHOLHDL 3.4 06/09/2022     12. Benign prostatic hyperplasia without lower urinary tract symptoms Sees urology. Recently had procedure for bladder cancer. Denies any current voiding issues  13. Vitamin D deficiency Is on vitamin d supplement  14.  BMI 26.0-26.9,adult No recent weight changes Wt Readings from Last 3 Encounters:  12/11/22 166 lb (75.3 kg)  11/26/22 164 lb (74.4 kg)  09/29/22 164 lb (74.4 kg)   BMI Readings from Last 3 Encounters:  12/11/22 24.51 kg/m  11/26/22 23.87 kg/m  09/29/22 23.87 kg/m     New complaints: None today  Allergies  Allergen Reactions   Lovenox [Enoxaparin]     HIT panel ordered 10/10   Outpatient Encounter Medications as of 12/11/2022  Medication Sig   alfuzosin (UROXATRAL) 10 MG 24 hr tablet TAKE ONE TABLET BY MOUTH AT BEDTIME   amLODipine (NORVASC) 10 MG tablet TAKE ONE TABLET BY MOUTH DAILY   apixaban (ELIQUIS) 5 MG TABS tablet Take 1 tablet (5 mg total) by mouth 2 (two) times daily.   atorvastatin (LIPITOR) 40 MG tablet TAKE ONE TABLET BY MOUTH DAILY   cholecalciferol (VITAMIN D) 1000 UNITS tablet Take 1 tablet (1,000 Units total) by mouth daily.   ferrous gluconate (FERGON) 324 MG tablet TAKE ONE TABLET TWICE DAILY WITH meals   levothyroxine (SYNTHROID) 88 MCG tablet TAKE ONE TABLET BY MOUTH DAILY   lisinopril (ZESTRIL) 40 MG tablet TAKE ONE TABLET BY MOUTH DAILY   metoprolol succinate (TOPROL-XL) 25 MG 24 hr tablet TAKE 1/2 TABLET BY MOUTH DAILY   Multiple Vitamin (MULTIVITAMIN WITH MINERALS) TABS tablet Take 1 tablet by mouth daily. One A Day Multivitamin for Men   Omega-3 Fatty Acids (FISH OIL) 1200 MG CAPS Take  2,400 mg by mouth daily.    pantoprazole (PROTONIX) 40 MG tablet TAKE ONE TABLET TWICE DAILY   No facility-administered encounter medications on file as of 12/11/2022.    Past Surgical History:  Procedure Laterality Date   ABDOMINAL AORTIC ANEURYSM REPAIR  08/09/2009   BACK SURGERY     x2   CATARACT EXTRACTION BILATERAL W/ ANTERIOR VITRECTOMY Bilateral 2022   CYSTOSCOPY W/ RETROGRADES Bilateral 09/02/2018   Procedure: CYSTOSCOPY WITH RETROGRADE PYELOGRAM;  Surgeon: Marcine Matar, MD;  Location: [facility];  Service: Urology;  Laterality:  Bilateral;   CYSTOSCOPY W/ RETROGRADES Bilateral 03/20/2020   Procedure: CYSTOSCOPY WITH BILATERAL RETROGRADE PYELOGRAM;  Surgeon: Marcine Matar, MD;  Location: [facility];  Service: Urology;  Laterality: Bilateral;   CYSTOSCOPY WITH BIOPSY N/A 09/02/2018   Procedure: CYSTOSCOPY WITH BIOPSY;  Surgeon: Marcine Matar, MD;  Location[facility];  Service: Urology;  Laterality: N/A;   ESOPHAGOGASTRODUODENOSCOPY N/A 02/21/2017   Procedure: ESOPHAGOGASTRODUODENOSCOPY (EGD);  Surgeon: Beverley Fiedler, MD;  Location: [facility] ENDOSCOPY;  Service: Gastroenterology;  Laterality: N/A;   HYDROCELE EXCISION / REPAIR     LOBECTOMY Right 03/30/2017   Procedure: RIGHT UPPER LOBECTOMY LUNG;  Surgeon: Loreli Slot, MD;  Location: [facility] OR;  Service: Thoracic;  Laterality: Right;   SKIN CANCER EXCISION     SPINE SURGERY     TEE WITHOUT CARDIOVERSION N/A 02/24/2017   Procedure: TRANSESOPHAGEAL ECHOCARDIOGRAM (TEE);  Surgeon: Tonny Bollman, MD;  Location: [facility] OR;  Service: Open Heart Surgery;  Laterality: N/A;   TRANSCATHETER AORTIC VALVE REPLACEMENT, TRANSFEMORAL N/A 02/24/2017   Procedure: TRANSCATHETER AORTIC VALVE REPLACEMENT, TRANSFEMORAL using a 26mm Edwards Sapien 3 Aortic Valve;  Surgeon: Tonny Bollman, MD;  Location: [facility] OR;  Service: Open Heart Surgery;  Laterality: N/A;   TRANSURETHRAL RESECTION OF BLADDER TUMOR N/A 06/06/2016   Procedure: TRANSURETHRAL RESECTION OF BLADDER TUMOR (TURBT);  Surgeon: Marcine Matar, MD;  Location: [facility];  Service: Urology;  Laterality: N/A;   TRANSURETHRAL RESECTION OF BLADDER TUMOR N/A 07/22/2016   Procedure: TRANSURETHRAL RESECTION OF BLADDER TUMOR (TURBT);  Surgeon: Marcine Matar, MD;  Location: [facility];  Service: Urology;  Laterality: N/A;   TRANSURETHRAL RESECTION OF BLADDER TUMOR N/A 03/20/2020   Procedure: TRANSURETHRAL RESECTION OF BLADDER TUMOR (TURBT);  Surgeon: Marcine Matar, MD;  Location: [facility];  Service: Urology;  Laterality:  N/A;   VIDEO ASSISTED THORACOSCOPY (VATS)/WEDGE RESECTION Right 03/30/2017   Procedure: VIDEO ASSISTED THORACOSCOPY (VATS)/WEDGE RESECTION;  Surgeon: Loreli Slot, MD;  Location: [facility] OR;  Service: Thoracic;  Laterality: Right;    Family History  Problem Relation Age of Onset   Heart disease Father        Heart Disease before age 6   Alcohol abuse Father    Heart attack Father    Cancer Mother        Lung   COPD Mother    COPD Sister    Lupus Sister    Hypertension Brother    Gout Brother    Heart attack Son    Gout Brother    Prostatitis Brother       Controlled substance contract: n/a     Review of Systems  Constitutional:  Negative for diaphoresis.  Eyes:  Negative for pain.  Respiratory:  Negative for shortness of breath.   Cardiovascular:  Negative for chest pain, palpitations and leg swelling.  Gastrointestinal:  Negative for abdominal pain.  Endocrine: Negative for polydipsia.  Skin:  Negative for rash.  Neurological:  Negative for dizziness, weakness and headaches.  Hematological:  Does not bruise/bleed easily.  All other systems reviewed and are negative.      Objective:   Physical Exam Vitals and nursing note reviewed.  Constitutional:      Appearance: Normal appearance. He is well-developed.  HENT:     Head: Normocephalic.     Nose: Nose normal.     Mouth/Throat:     Mouth: Mucous membranes are moist.     Pharynx: Oropharynx is clear.  Eyes:     Pupils: Pupils are equal, round, and reactive to light.  Neck:     Thyroid: No thyroid mass or thyromegaly.     Vascular: No carotid bruit or JVD.     Trachea: Phonation normal.  Cardiovascular:     Rate and Rhythm: Normal rate and regular rhythm.  Pulmonary:     Effort: Pulmonary effort is normal. No respiratory distress.     Breath sounds: Normal breath sounds.  Abdominal:     General: Bowel sounds are normal.     Palpations: Abdomen is soft.     Tenderness: There is no abdominal  tenderness.  Musculoskeletal:        General: Normal range of motion.     Cervical back: Normal range of motion and neck supple.  Lymphadenopathy:     Cervical: No cervical adenopathy.  Skin:    General: Skin is warm and dry.  Neurological:     Mental Status: He is alert and oriented to person, place, and time.  Psychiatric:        Behavior: Behavior normal.        Thought Content: Thought content normal.        Judgment: Judgment normal.     BP 128/63   Pulse 90   Temp 98 F (36.7 C) (Temporal)   Resp 20   Ht 5\' 9"  (1.753 m)   Wt 166 lb (75.3 kg)   SpO2 98%   BMI 24.51 kg/m        Assessment & Plan:  [name] [name] comes in today with chief complaint of Medical Management of Chronic Issues   Diagnosis and orders addressed:  1. Primary hypertension Low sodium diet - CBC with Differential/Platelet - CMP14+EGFR - amLODipine (NORVASC) 10 MG tablet; Take 1 tablet (10 mg total) by mouth daily.  Dispense: 90 tablet; Refill: 1  2. Nonrheumatic aortic valve stenosis 3. Bilateral carotid artery stenosis 4. PVD (peripheral vascular disease) (HCC) 5. Permanent atrial fibrillation (HCC) - apixaban (ELIQUIS) 5 MG TABS tablet; Take 1 tablet (5 mg total) by mouth 2 (two) times daily.  Dispense: 180 tablet; Refill: 1  6. Coronary artery disease of native artery of native heart with stable angina pectoris (HCC) Yearly follow up with cardiology - metoprolol succinate (TOPROL-XL) 25 MG 24 hr tablet; Take 0.5 tablets (12.5 mg total) by mouth daily.  Dispense: 45 tablet; Refill: 1  7. Chronic obstructive pulmonary disease, unspecified COPD type (HCC)  8. Primary lung cancer, right (HCC)  9. Gastroesophageal reflux disease without esophagitis Avoid spicy foods Do not eat 2 hours prior to bedtime - pantoprazole (PROTONIX) 40 MG tablet; TAKE ONE TABLET TWICE DAILY  Dispense: 180 tablet; Refill: 1  10. Hypothyroidism due to acquired atrophy of thyroid Labs oending - Thyroid  Panel With TSH - levothyroxine (SYNTHROID) 88 MCG tablet; Take 1 tablet (88 mcg total) by mouth daily.  Dispense: 90 tablet; Refill: 1  11. Mixed hyperlipidemia Low fat diet - Lipid panel -  atorvastatin (LIPITOR) 40 MG tablet; Take 1 tablet (40 mg total) by mouth daily.  Dispense: 90 tablet; Refill: 1 - lisinopril (ZESTRIL) 40 MG tablet; Take 1 tablet (40 mg total) by mouth daily.  Dispense: 90 tablet; Refill: 1  12. Benign prostatic hyperplasia without lower urinary tract symptoms Report any voiding issues - alfuzosin (UROXATRAL) 10 MG 24 hr tablet; Take 1 tablet (10 mg total) by mouth at bedtime.  Dispense: 90 tablet; Refill: 1  13. Vitamin D deficiency Continue vitamin d supplement  14. BMI 26.0-26.9,adult Discussed diet and exercise for person with BMI >25 Will recheck weight in 3-6 months    Labs pending Health Maintenance reviewed Diet and exercise encouraged  Follow up plan: 6 months   [name] Daphine Deutscher, FNP

## 2022-12-12 LAB — LIPID PANEL
Chol/HDL Ratio: 3.6 ratio (ref 0.0–5.0)
Cholesterol, Total: 103 mg/dL (ref 100–199)
HDL: 29 mg/dL — ABNORMAL LOW (ref >39)
LDL Chol Calc (NIH): 56 mg/dL (ref 0–99)
Triglycerides: 95 mg/dL (ref 0–149)
VLDL Cholesterol Cal: 18 mg/dL (ref 5–40)

## 2022-12-12 LAB — THYROID PANEL WITH TSH
T3 Uptake Ratio: 26 % (ref 24–39)
T4, Total: 5.2 ug/dL (ref 4.5–12.0)
TSH: 3.55 u[IU]/mL (ref 0.450–4.500)

## 2022-12-12 LAB — CBC WITH DIFFERENTIAL/PLATELET
Basophils Absolute: 0 10*3/uL (ref 0.0–0.2)
Basos: 0 %
EOS (ABSOLUTE): 0.2 10*3/uL (ref 0.0–0.4)
Eos: 4 %
Hematocrit: 34.1 % — ABNORMAL LOW (ref 37.5–51.0)
Lymphs: 23 %
MCH: 30.9 pg (ref 26.6–33.0)
MCHC: 33.4 g/dL (ref 31.5–35.7)
Monocytes Absolute: 0.5 10*3/uL (ref 0.1–0.9)
Monocytes: 8 %
Neutrophils: 65 %
RBC: 3.69 x10E6/uL — ABNORMAL LOW (ref 4.14–5.80)
RDW: 12.4 % (ref 11.6–15.4)
WBC: 5.5 10*3/uL (ref 3.4–10.8)

## 2022-12-12 LAB — CMP14+EGFR
ALT: 15 IU/L (ref 0–44)
AST: 25 IU/L (ref 0–40)
Albumin: 4.3 g/dL (ref 3.7–4.7)
BUN/Creatinine Ratio: 16 (ref 10–24)
CO2: 23 mmol/L (ref 20–29)
Calcium: 9.1 mg/dL (ref 8.6–10.2)
Globulin, Total: 3.3 g/dL (ref 1.5–4.5)
Glucose: 105 mg/dL — ABNORMAL HIGH (ref 70–99)
Potassium: 4.2 mmol/L (ref 3.5–5.2)
Sodium: 142 mmol/L (ref 134–144)
Total Protein: 7.6 g/dL (ref 6.0–8.5)
eGFR: 61 mL/min/{1.73_m2} (ref >59)

## 2022-12-15 ENCOUNTER — Ambulatory Visit (INDEPENDENT_AMBULATORY_CARE_PROVIDER_SITE_OTHER): Payer: Medicare Other

## 2022-12-15 VITALS — Ht 69.0 in | Wt 166.0 lb

## 2022-12-15 DIAGNOSIS — Z0000 Encounter for general adult medical examination without abnormal findings: Secondary | ICD-10-CM

## 2022-12-15 NOTE — Progress Notes (Signed)
Subjective:   [name] [name] is a 82 y.o. male who presents for Medicare Annual/Subsequent preventive examination.  Visit Complete: Virtual  I connected with  [name] [name] on 12/15/22 by a audio enabled telemedicine application and verified that I am speaking with the correct person using two identifiers.  Patient Location: Home  Provider Location: Home Office  I discussed the limitations of evaluation and management by telemedicine. The patient expressed understanding and agreed to proceed.  Patient Medicare AWV questionnaire was completed by the patient on 12/15/2022; I have confirmed that all information answered by patient is correct and no changes since this date.  Review of Systems     Cardiac Risk Factors include: advanced age (>2men, >36 women);male gender;dyslipidemia;hypertension     Objective:    Today's Vitals   12/15/22 1125  Weight: 166 lb (75.3 kg)  Height: 5\' 9"  (1.753 m)   Body mass index is 24.51 kg/m.     12/15/2022   11:29 AM 12/09/2021   11:20 AM 04/18/2021    9:54 AM 12/06/2020    1:45 PM 03/21/2020   10:18 AM 03/21/2020    8:23 AM 03/20/2020    7:06 AM  Advanced Directives  Does Patient Have a Medical Advance Directive? Yes Yes Yes Yes No Yes Yes  Type of Estate agent of [place];Living will Healthcare Power of [place];Living will Living will Healthcare Power of [place];Living will  Healthcare Power of Attorney Living will;Healthcare Power of Attorney  Does patient want to make changes to medical advance directive?     No - Patient declined No - Patient declined No - Patient declined  Copy of Healthcare Power of Attorney in Chart? No - copy requested No - copy requested  No - copy requested  No - copy requested     Current Medications (verified) Outpatient Encounter Medications as of 12/15/2022  Medication Sig   albuterol (VENTOLIN HFA) 108 (90 Base) MCG/ACT inhaler Inhale into the lungs.   alfuzosin (UROXATRAL)  10 MG 24 hr tablet Take 1 tablet (10 mg total) by mouth at bedtime.   amLODipine (NORVASC) 10 MG tablet Take 1 tablet (10 mg total) by mouth daily.   apixaban (ELIQUIS) 5 MG TABS tablet Take 1 tablet (5 mg total) by mouth 2 (two) times daily.   atorvastatin (LIPITOR) 40 MG tablet Take 1 tablet (40 mg total) by mouth daily.   cholecalciferol (VITAMIN D) 1000 UNITS tablet Take 1 tablet (1,000 Units total) by mouth daily.   ferrous gluconate (FERGON) 324 MG tablet TAKE ONE TABLET TWICE DAILY WITH meals   levothyroxine (SYNTHROID) 88 MCG tablet Take 1 tablet (88 mcg total) by mouth daily.   lisinopril (ZESTRIL) 40 MG tablet Take 1 tablet (40 mg total) by mouth daily.   metoprolol succinate (TOPROL-XL) 25 MG 24 hr tablet Take 0.5 tablets (12.5 mg total) by mouth daily.   Multiple Vitamin (MULTIVITAMIN WITH MINERALS) TABS tablet Take 1 tablet by mouth daily. One A Day Multivitamin for Men   Omega-3 Fatty Acids (FISH OIL) 1200 MG CAPS Take 2,400 mg by mouth daily.    pantoprazole (PROTONIX) 40 MG tablet TAKE ONE TABLET TWICE DAILY   No facility-administered encounter medications on file as of 12/15/2022.    Allergies (verified) Lovenox [enoxaparin]   History: Past Medical History:  Diagnosis Date   AAA (abdominal aortic aneurysm) (HCC)    Aortic stenosis, severe    a. 02/2017: s/p TAVR with Edwards Sapien 3 THV (size 26 mm, model #  9600TFX, serial # [id number])   Arthritis    Atherosclerotic peripheral vascular disease (HCC)    Atrial fibrillation, persistent (HCC)    a. not a candidate for [facility] given hx of GI bleed   Bilateral carotid artery stenosis    Bladder cancer (HCC)    COPD (chronic obstructive pulmonary disease) (HCC)    Gastroesophageal reflux disease without esophagitis 05/25/2019   Hyperlipidemia    Hypertension    Hypothyroidism    Lung cancer (HCC)    Right   Lung mass    Right   Skin cancer    Vitamin D deficiency    Past Surgical History:  Procedure Laterality Date    ABDOMINAL AORTIC ANEURYSM REPAIR  08/09/2009   BACK SURGERY     x2   CATARACT EXTRACTION BILATERAL W/ ANTERIOR VITRECTOMY Bilateral 2022   CYSTOSCOPY W/ RETROGRADES Bilateral 09/02/2018   Procedure: CYSTOSCOPY WITH RETROGRADE PYELOGRAM;  Surgeon: Marcine Matar, MD;  Location: [facility];  Service: Urology;  Laterality: Bilateral;   CYSTOSCOPY W/ RETROGRADES Bilateral 03/20/2020   Procedure: CYSTOSCOPY WITH BILATERAL RETROGRADE PYELOGRAM;  Surgeon: Marcine Matar, MD;  Location: [facility];  Service: Urology;  Laterality: Bilateral;   CYSTOSCOPY WITH BIOPSY N/A 09/02/2018   Procedure: CYSTOSCOPY WITH BIOPSY;  Surgeon: Marcine Matar, MD;  [facility];  Service: Urology;  Laterality: N/A;   ESOPHAGOGASTRODUODENOSCOPY N/A 02/21/2017   Procedure: ESOPHAGOGASTRODUODENOSCOPY (EGD);  Surgeon: Beverley Fiedler, MD;  Location[facility] ENDOSCOPY;  Service: Gastroenterology;  Laterality: N/A;   HYDROCELE EXCISION / REPAIR     LOBECTOMY Right 03/30/2017   Procedure: RIGHT UPPER LOBECTOMY LUNG;  Surgeon: Loreli Slot, MD;  Location: [facility] OR;  Service: Thoracic;  Laterality: Right;   SKIN CANCER EXCISION     SPINE SURGERY     TEE WITHOUT CARDIOVERSION N/A 02/24/2017   Procedure: TRANSESOPHAGEAL ECHOCARDIOGRAM (TEE);  Surgeon: Tonny Bollman, MD;  Location: [facility] OR;  Service: Open Heart Surgery;  Laterality: N/A;   TRANSCATHETER AORTIC VALVE REPLACEMENT, TRANSFEMORAL N/A 02/24/2017   Procedure: TRANSCATHETER AORTIC VALVE REPLACEMENT, TRANSFEMORAL using a 26mm Edwards Sapien 3 Aortic Valve;  Surgeon: Tonny Bollman, MD;  Location: [facility] OR;  Service: Open Heart Surgery;  Laterality: N/A;   TRANSURETHRAL RESECTION OF BLADDER TUMOR N/A 06/06/2016   Procedure: TRANSURETHRAL RESECTION OF BLADDER TUMOR (TURBT);  Surgeon: Marcine Matar, MD;  Location: [facility];  Service: Urology;  Laterality: N/A;   TRANSURETHRAL RESECTION OF BLADDER TUMOR N/A 07/22/2016   Procedure:  TRANSURETHRAL RESECTION OF BLADDER TUMOR (TURBT);  Surgeon: Marcine Matar, MD;  Location: [facility];  Service: Urology;  Laterality: N/A;   TRANSURETHRAL RESECTION OF BLADDER TUMOR N/A 03/20/2020   Procedure: TRANSURETHRAL RESECTION OF BLADDER TUMOR (TURBT);  Surgeon: Marcine Matar, MD;  Location: [facility];  Service: Urology;  Laterality: N/A;   VIDEO ASSISTED THORACOSCOPY (VATS)/WEDGE RESECTION Right 03/30/2017   Procedure: VIDEO ASSISTED THORACOSCOPY (VATS)/WEDGE RESECTION;  Surgeon: Loreli Slot, MD;  Location: [facility] OR;  Service: Thoracic;  Laterality: Right;   Family History  Problem Relation Age of Onset   Heart disease Father        Heart Disease before age 60   Alcohol abuse Father    Heart attack Father    Cancer Mother        Lung   COPD Mother    COPD Sister    Lupus Sister    Hypertension Brother    Gout Brother    Heart attack Son    Gout Brother  Prostatitis Brother    Social History   Socioeconomic History   Marital status: Married    Spouse name: Kyng Matlock   Number of children: 2   Years of education: Not on file   Highest education level: Not on file  Occupational History   Occupation: Retired    Comment: Merchandiser, retail with [employer]  Tobacco Use   Smoking status: Former    Packs/day: 1.00    Years: 51.00    Additional pack years: 0.00    Total pack years: 51.00    Types: Cigarettes    Start date: 11/25/1954    Quit date: 10/27/2005    Years since quitting: 17.1   Smokeless tobacco: Never  Vaping Use   Vaping Use: Never used  Substance and Sexual Activity   Alcohol use: No    Alcohol/week: 0.0 standard drinks of alcohol    Comment: none since 1999   Drug use: No   Sexual activity: Yes  Other Topics Concern   Not on file  Social History Narrative   2 sons that are deceased. 4 grandchildren and 3 great grandchildren. Lives at home with his wife. He stays active, plays golf, does yard work. Goes to [employer].   Social  Determinants of Health   Financial Resource Strain: Low Risk  (12/15/2022)   Overall Financial Resource Strain (CARDIA)    Difficulty of Paying Living Expenses: Not hard at all  Food Insecurity: No Food Insecurity (12/15/2022)   Hunger Vital Sign    Worried About Running Out of Food in the Last Year: Never true    Ran Out of Food in the Last Year: Never true  Transportation Needs: No Transportation Needs (12/15/2022)   PRAPARE - Administrator, Civil Service (Medical): No    Lack of Transportation (Non-Medical): No  Physical Activity: Insufficiently Active (12/15/2022)   Exercise Vital Sign    Days of Exercise per Week: 3 days    Minutes of Exercise per Session: 30 min  Stress: No Stress Concern Present (12/15/2022)   [employer] of Occupational Health - Occupational Stress Questionnaire    Feeling of Stress : Not at all  Social Connections: Socially Integrated (12/15/2022)   Social Connection and Isolation Panel [NHANES]    Frequency of Communication with Friends and Family: More than three times a week    Frequency of Social Gatherings with Friends and Family: More than three times a week    Attends Religious Services: More than 4 times per year    Active Member of [employer] or Organizations: Yes    Attends Engineer, structural: More than 4 times per year    Marital Status: Married    Tobacco Counseling Counseling given: Not Answered   Clinical Intake:  Pre-visit preparation completed: Yes  Pain : No/denies pain     Nutritional Risks: None Diabetes: No  How often do you need to have someone help you when you read instructions, pamphlets, or other written materials from your doctor or pharmacy?: 1 - Never  Interpreter Needed?: No  Information entered by :: Renie Ora, LPN   Activities of Daily Living    12/15/2022   11:29 AM  In your present state of health, do you have any difficulty performing the following activities:  Hearing? 0  Vision?  0  Difficulty concentrating or making decisions? 0  Walking or climbing stairs? 0  Dressing or bathing? 0  Doing errands, shopping? 0  Preparing Food and eating ?  N  Using the Toilet? N  In the past six months, have you accidently leaked urine? N  Do you have problems with loss of bowel control? N  Managing your Medications? N  Managing your Finances? N  Housekeeping or managing your Housekeeping? N    Patient Care Team: [name], FNP as PCP - General (Family Medicine) [name], FNP as Nurse Practitioner (Nurse Practitioner) Nita Sells, MD (Dermatology) Nada Libman, MD as Consulting Physician (Vascular Surgery) Tonny Bollman, MD as Consulting Physician (Cardiology) Marcine Matar, MD as Consulting Physician (Urology) Loreli Slot, MD as Consulting Physician (Cardiothoracic Surgery)  Indicate any recent Medical Services you may have received from other than [facility] providers in the past year (date may be approximate).     Assessment:   This is a routine wellness examination for [place].  Hearing/Vision screen Vision Screening - Comments:: Wears rx glasses - up to date with routine eye exams with  Dr.Johnson   Dietary issues and exercise activities discussed:     Goals Addressed             This Visit's Progress    Exercise 3x per week (30 min per time)   On track      Depression Screen    12/15/2022   11:28 AM 12/11/2022    8:13 AM 06/09/2022    8:15 AM 12/09/2021   11:17 AM 12/05/2021    7:50 AM 06/06/2021   10:00 AM 12/06/2020    1:36 PM  PHQ 2/9 Scores  PHQ - 2 Score 0 0 0 0 0 0 0  PHQ- 9 Score 0 2 0 0 0 1 0    Fall Risk    12/15/2022   11:26 AM 12/11/2022    8:13 AM 06/09/2022    8:15 AM 12/09/2021   11:20 AM 12/05/2021    7:50 AM  Fall Risk   Falls in the past year? 0 0 0 0 0  Number falls in past yr: 0   0   Injury with Fall? 0   0   Risk for fall due to : No Fall Risks   No Fall Risks   Follow up Falls  prevention discussed   Falls prevention discussed     MEDICARE RISK AT HOME:  Medicare Risk at Home - 12/15/22 1126     Any stairs in or around the home? Yes    If so, are there any without handrails? No    Home free of loose throw rugs in walkways, pet beds, electrical cords, etc? Yes    Adequate lighting in your home to reduce risk of falls? Yes    Life alert? No    Use of a cane, walker or w/c? No    Grab bars in the bathroom? Yes    Shower chair or bench in shower? Yes    Elevated toilet seat or a handicapped toilet? Yes             TIMED UP AND GO:  Was the test performed?  No    Cognitive Function:    06/10/2018   10:22 AM 05/27/2017    8:58 AM 05/26/2016    9:55 AM 12/05/2014    9:13 AM  MMSE - Mini Mental State Exam  Orientation to time 5 5 5 5   Orientation to Place 5 5 5 5   Registration 3 3 3 3   Attention/ Calculation 5 5 5 5   Recall 3 2 3 2   Language- name 2  objects 2 2 2 2   Language- repeat 1 1 1 1   Language- follow 3 step command 3 3 3 3   Language- read & follow direction 1 1 1 1   Write a sentence 1 1 1 1   Copy design 1 1 1 1   Total score 30 29 30 29         12/15/2022   11:29 AM 12/09/2021   11:22 AM 12/06/2020    1:45 PM  6CIT Screen  What Year? 0 points 0 points 0 points  What month? 0 points 0 points 0 points  What time? 0 points 0 points 0 points  Count back from 20 0 points 0 points 0 points  Months in reverse 0 points 2 points 0 points  Repeat phrase 0 points 0 points 0 points  Total Score 0 points 2 points 0 points    Immunizations Immunization History  Administered Date(s) Administered   Covid-19, Mrna,Vaccine(Spikevax)63yrs and older 06/20/2022   Fluad Quad(high Dose 65+) 03/08/2019, 03/08/2019, 03/18/2021, 03/21/2022   Influenza Inj Mdck Quad With Preservative 03/18/2018   Influenza Split 03/23/2013   Influenza, High Dose Seasonal PF 03/06/2014   Influenza,inj,Quad PF,6+ Mos 03/22/2015, 03/18/2017   Influenza-Unspecified  03/22/2016, 03/18/2018, 03/02/2019, 03/27/2020   Moderna Covid-19 Vaccine Bivalent Booster 58yrs & up 07/02/2021   Moderna Sars-Covid-2 Vaccination 08/02/2019, 08/30/2019, 04/24/2020, 02/19/2021   Pneumococcal Conjugate-13 04/26/2013   Pneumococcal Polysaccharide-23 06/23/2004, 12/11/2014   Td 03/24/2007   Tdap 12/05/2021   Zoster Recombinat (Shingrix) 07/04/2020, 09/14/2020    TDAP status: Up to date  Flu Vaccine status: Up to date  Pneumococcal vaccine status: Up to date  Covid-19 vaccine status: Completed vaccines  Qualifies for Shingles Vaccine? Yes   Zostavax completed Yes   Shingrix Completed?: Yes  Screening Tests Health Maintenance  Topic Date Due   COVID-19 Vaccine (7 - 2023-24 season) 12/27/2022 (Originally 08/15/2022)   INFLUENZA VACCINE  01/22/2023   Medicare Annual Wellness (AWV)  12/15/2023   DTaP/Tdap/Td (3 - Td or Tdap) 12/06/2031   Pneumonia Vaccine 24+ Years old  Completed   Zoster Vaccines- Shingrix  Completed   HPV VACCINES  Aged Out    Health Maintenance  There are no preventive care reminders to display for this patient.   Colorectal cancer screening: No longer required.   Lung Cancer Screening: (Low Dose CT Chest recommended if Age 63-80 years, 20 pack-year currently smoking OR have quit w/in 15years.) does not qualify.   Lung Cancer Screening Referral: n/a  Additional Screening:  Hepatitis C Screening: does not qualify;   Vision Screening: Recommended annual ophthalmology exams for early detection of glaucoma and other disorders of the eye. Is the patient up to date with their annual eye exam?  Yes  Who is the provider or what is the name of the office in which the patient attends annual eye exams? Dr.Johnson  If pt is not established with a provider, would they like to be referred to a provider to establish care? No .   Dental Screening: Recommended annual dental exams for proper oral hygiene  Diabetic Foot Exam: Diabetic Foot Exam:  Overdue, Pt has been advised about the importance in completing this exam. Pt is scheduled for diabetic foot exam on 12/15/2022.  Community Resource Referral / Chronic Care Management: CRR required this visit?  No   CCM required this visit?  No     Plan:     I have personally reviewed and noted the following in the patient's chart:   Medical and social  history Use of alcohol, tobacco or illicit drugs  Current medications and supplements including opioid prescriptions. Patient is not currently taking opioid prescriptions. Functional ability and status Nutritional status Physical activity Advanced directives List of other physicians Hospitalizations, surgeries, and ER visits in previous 12 months Vitals Screenings to include cognitive, depression, and falls Referrals and appointments  In addition, I have reviewed and discussed with patient certain preventive protocols, quality metrics, and best practice recommendations. A written personalized care plan for preventive services as well as general preventive health recommendations were provided to patient.     Lorrene Reid, LPN   [id number]   After Visit Summary: (MyChart) Due to this being a telephonic visit, the after visit summary with patients personalized plan was offered to patient via MyChart   Nurse Notes: none

## 2022-12-15 NOTE — Patient Instructions (Signed)
[name] [name] , Thank you for taking time to come for your Medicare Wellness Visit. I appreciate your ongoing commitment to your health goals. Please review the following plan we discussed and let me know if I can assist you in the future.   These are the goals we discussed:  Goals      Exercise 3x per week (30 min per time)        This is a list of the screening recommended for you and due dates:  Health Maintenance  Topic Date Due   COVID-19 Vaccine (7 - 2023-24 season) 12/27/2022*   Flu Shot  01/22/2023   Medicare Annual Wellness Visit  12/15/2023   DTaP/Tdap/Td vaccine (3 - Td or Tdap) 12/06/2031   Pneumonia Vaccine  Completed   Zoster (Shingles) Vaccine  Completed   HPV Vaccine  Aged Out  *Topic was postponed. The date shown is not the original due date.    Advanced directives: Please bring a copy of your health care power of attorney and living will to the office to be added to your chart at your convenience.   Conditions/risks identified: Aim for 30 minutes of exercise or brisk walking, 6-8 glasses of water, and 5 servings of fruits and vegetables each day.   Next appointment: Follow up in one year for your annual wellness visit.   Preventive Care 70 Years and Older, Male  Preventive care refers to lifestyle choices and visits with your health care provider that can promote health and wellness. What does preventive care include? A yearly physical exam. This is also called an annual well check. Dental exams once or twice a year. Routine eye exams. Ask your health care provider how often you should have your eyes checked. Personal lifestyle choices, including: Daily care of your teeth and gums. Regular physical activity. Eating a healthy diet. Avoiding tobacco and drug use. Limiting alcohol use. Practicing safe sex. Taking low doses of aspirin every day. Taking vitamin and mineral supplements as recommended by your health care provider. What happens during an annual  well check? The services and screenings done by your health care provider during your annual well check will depend on your age, overall health, lifestyle risk factors, and family history of disease. Counseling  Your health care provider may ask you questions about your: Alcohol use. Tobacco use. Drug use. Emotional well-being. Home and relationship well-being. Sexual activity. Eating habits. History of falls. Memory and ability to understand (cognition). Work and work Astronomer. Screening  You may have the following tests or measurements: Height, weight, and BMI. Blood pressure. Lipid and cholesterol levels. These may be checked every 5 years, or more frequently if you are over 26 years old. Skin check. Lung cancer screening. You may have this screening every year starting at age 52 if you have a 30-pack-year history of smoking and currently smoke or have quit within the past 15 years. Fecal occult blood test (FOBT) of the stool. You may have this test every year starting at age 54. Flexible sigmoidoscopy or colonoscopy. You may have a sigmoidoscopy every 5 years or a colonoscopy every 10 years starting at age 10. Prostate cancer screening. Recommendations will vary depending on your family history and other risks. Hepatitis C blood test. Hepatitis B blood test. Sexually transmitted disease (STD) testing. Diabetes screening. This is done by checking your blood sugar (glucose) after you have not eaten for a while (fasting). You may have this done every 1-3 years. Abdominal aortic aneurysm (AAA) screening. You  may need this if you are a current or former smoker. Osteoporosis. You may be screened starting at age 57 if you are at high risk. Talk with your health care provider about your test results, treatment options, and if necessary, the need for more tests. Vaccines  Your health care provider may recommend certain vaccines, such as: Influenza vaccine. This is recommended every  year. Tetanus, diphtheria, and acellular pertussis (Tdap, Td) vaccine. You may need a Td booster every 10 years. Zoster vaccine. You may need this after age 30. Pneumococcal 13-valent conjugate (PCV13) vaccine. One dose is recommended after age 82. Pneumococcal polysaccharide (PPSV23) vaccine. One dose is recommended after age 42. Talk to your health care provider about which screenings and vaccines you need and how often you need them. This information is not intended to replace advice given to you by your health care provider. Make sure you discuss any questions you have with your health care provider. Document Released: 07/06/2015 Document Revised: 02/27/2016 Document Reviewed: 04/10/2015 Elsevier Interactive Patient Education  2017 [place] Prevention in the Home Falls can cause injuries. They can happen to people of all ages. There are many things you can do to make your home safe and to help prevent falls. What can I do on the outside of my home? Regularly fix the edges of walkways and driveways and fix any cracks. Remove anything that might make you trip as you walk through a door, such as a raised step or threshold. Trim any bushes or trees on the path to your home. Use bright outdoor lighting. Clear any walking paths of anything that might make someone trip, such as rocks or tools. Regularly check to see if handrails are loose or broken. Make sure that both sides of any steps have handrails. Any raised decks and porches should have guardrails on the edges. Have any leaves, snow, or ice cleared regularly. Use sand or salt on walking paths during winter. Clean up any spills in your garage right away. This includes oil or grease spills. What can I do in the bathroom? Use night lights. Install grab bars by the toilet and in the tub and shower. Do not use towel bars as grab bars. Use non-skid mats or decals in the tub or shower. If you need to sit down in the shower, use a  plastic, non-slip stool. Keep the floor dry. Clean up any water that spills on the floor as soon as it happens. Remove soap buildup in the tub or shower regularly. Attach bath mats securely with double-sided non-slip rug tape. Do not have throw rugs and other things on the floor that can make you trip. What can I do in the bedroom? Use night lights. Make sure that you have a light by your bed that is easy to reach. Do not use any sheets or blankets that are too big for your bed. They should not hang down onto the floor. Have a firm chair that has side arms. You can use this for support while you get dressed. Do not have throw rugs and other things on the floor that can make you trip. What can I do in the kitchen? Clean up any spills right away. Avoid walking on wet floors. Keep items that you use a lot in easy-to-reach places. If you need to reach something above you, use a strong step stool that has a grab bar. Keep electrical cords out of the way. Do not use floor polish or wax that  makes floors slippery. If you must use wax, use non-skid floor wax. Do not have throw rugs and other things on the floor that can make you trip. What can I do with my stairs? Do not leave any items on the stairs. Make sure that there are handrails on both sides of the stairs and use them. Fix handrails that are broken or loose. Make sure that handrails are as long as the stairways. Check any carpeting to make sure that it is firmly attached to the stairs. Fix any carpet that is loose or worn. Avoid having throw rugs at the top or bottom of the stairs. If you do have throw rugs, attach them to the floor with carpet tape. Make sure that you have a light switch at the top of the stairs and the bottom of the stairs. If you do not have them, ask someone to add them for you. What else can I do to help prevent falls? Wear shoes that: Do not have high heels. Have rubber bottoms. Are comfortable and fit you  well. Are closed at the toe. Do not wear sandals. If you use a stepladder: Make sure that it is fully opened. Do not climb a closed stepladder. Make sure that both sides of the stepladder are locked into place. Ask someone to hold it for you, if possible. Clearly mark and make sure that you can see: Any grab bars or handrails. First and last steps. Where the edge of each step is. Use tools that help you move around (mobility aids) if they are needed. These include: Canes. Walkers. Scooters. Crutches. Turn on the lights when you go into a dark area. Replace any light bulbs as soon as they burn out. Set up your furniture so you have a clear path. Avoid moving your furniture around. If any of your floors are uneven, fix them. If there are any pets around you, be aware of where they are. Review your medicines with your doctor. Some medicines can make you feel dizzy. This can increase your chance of falling. Ask your doctor what other things that you can do to help prevent falls. This information is not intended to replace advice given to you by your health care provider. Make sure you discuss any questions you have with your health care provider. Document Released: 04/05/2009 Document Revised: 11/15/2015 Document Reviewed: 07/14/2014 Elsevier Interactive Patient Education  2017 [employer].

## 2023-04-06 ENCOUNTER — Ambulatory Visit: Payer: Medicare Other | Admitting: Physician Assistant

## 2023-04-06 ENCOUNTER — Ambulatory Visit (HOSPITAL_COMMUNITY)
Admission: RE | Admit: 2023-04-06 | Discharge: 2023-04-06 | Disposition: A | Discharge status: Home / Self Care | Payer: Medicare Other | Source: Ambulatory Visit | Attending: Surgery | Admitting: Surgery

## 2023-04-06 VITALS — BP 148/64 | HR 55 | Temp 98.1°F | Resp 18 | Ht 70.0 in | Wt 162.1 lb

## 2023-04-06 DIAGNOSIS — Z95828 Presence of other vascular implants and grafts: Secondary | ICD-10-CM | POA: Insufficient documentation

## 2023-04-06 NOTE — Progress Notes (Signed)
[facility] [place] HISTORY AND PHYSICAL   History of Present Illness:  Patient is a 82 y.o. year old male who presents for evaluation of abdominal aortic aneurysm.  He has a history of abdominal aortic aneurysm status post EVAR 2011.   He denies claudication, rest pain or non healing wounds.  He denies lumbar or abdominal pain.  He is active with mowing, playing golf and gardening.    H is medically managed on   The pt is on a statin for cholesterol management.  The pt is not on a daily aspirin.   Other AC: Eliquis The pt is on Norvasc, Zestril for hypertension.   The pt is not diabetic.   Tobacco hx: Former smoker, quit 2007  Past Medical History:  Diagnosis Date   AAA (abdominal aortic aneurysm) (HCC)    Aortic stenosis, severe    a. 02/2017: s/p TAVR with Edwards Sapien 3 THV (size 26 mm, model # 9600TFX, serial # [id number])   Arthritis    Atherosclerotic peripheral vascular disease (HCC)    Atrial fibrillation, persistent (HCC)    a. not a candidate for [facility] given hx of GI bleed   Bilateral carotid artery stenosis    Bladder cancer (HCC)    COPD (chronic obstructive pulmonary disease) (HCC)    Gastroesophageal reflux disease without esophagitis 05/25/2019   Hyperlipidemia    Hypertension    Hypothyroidism    Lung cancer (HCC)    Right   Lung mass    Right   Skin cancer    Vitamin D deficiency     Past Surgical History:  Procedure Laterality Date   ABDOMINAL AORTIC ANEURYSM REPAIR  08/09/2009   BACK SURGERY     x2   CATARACT EXTRACTION BILATERAL W/ ANTERIOR VITRECTOMY Bilateral 2022   CYSTOSCOPY W/ RETROGRADES Bilateral 09/02/2018   Procedure: CYSTOSCOPY WITH RETROGRADE PYELOGRAM;  Surgeon: Marcine Matar, MD;  Location: [facility];  Service: Urology;  Laterality: Bilateral;   CYSTOSCOPY W/ RETROGRADES Bilateral 03/20/2020   Procedure: CYSTOSCOPY WITH BILATERAL RETROGRADE PYELOGRAM;  Surgeon: Marcine Matar, MD;  Location: [facility]  ORS;  Service: Urology;  Laterality: Bilateral;   CYSTOSCOPY WITH BIOPSY N/A 09/02/2018   Procedure: CYSTOSCOPY WITH BIOPSY;  Surgeon: Marcine Matar, MD;  Location: [facility];  Service: Urology;  Laterality: N/A;   ESOPHAGOGASTRODUODENOSCOPY N/A 02/21/2017   Procedure: ESOPHAGOGASTRODUODENOSCOPY (EGD);  Surgeon: Beverley Fiedler, MD;  Location: [facility] ENDOSCOPY;  Service: Gastroenterology;  Laterality: N/A;   HYDROCELE EXCISION / REPAIR     LOBECTOMY Right 03/30/2017   Procedure: RIGHT UPPER LOBECTOMY LUNG;  Surgeon: Loreli Slot, MD;  Location: [facility] OR;  Service: Thoracic;  Laterality: Right;   SKIN CANCER EXCISION     SPINE SURGERY     TEE WITHOUT CARDIOVERSION N/A 02/24/2017   Procedure: TRANSESOPHAGEAL ECHOCARDIOGRAM (TEE);  Surgeon: Tonny Bollman, MD;  Location: [facility] OR;  Service: Open Heart Surgery;  Laterality: N/A;   TRANSCATHETER AORTIC VALVE REPLACEMENT, TRANSFEMORAL N/A 02/24/2017   Procedure: TRANSCATHETER AORTIC VALVE REPLACEMENT, TRANSFEMORAL using a 26mm Edwards Sapien 3 Aortic Valve;  Surgeon: Tonny Bollman, MD;  Location: [facility] OR;  Service: Open Heart Surgery;  Laterality: N/A;   TRANSURETHRAL RESECTION OF BLADDER TUMOR N/A 06/06/2016   Procedure: TRANSURETHRAL RESECTION OF BLADDER TUMOR (TURBT);  Surgeon: Marcine Matar, MD;  Location: [facility] ORS;  Service: Urology;  Laterality: N/A;   TRANSURETHRAL RESECTION OF BLADDER TUMOR N/A 07/22/2016   Procedure: TRANSURETHRAL RESECTION OF BLADDER TUMOR (TURBT);  Surgeon: Marcine Matar, MD;  Location: [facility];  Service: Urology;  Laterality: N/A;   TRANSURETHRAL RESECTION OF BLADDER TUMOR N/A 03/20/2020   Procedure: TRANSURETHRAL RESECTION OF BLADDER TUMOR (TURBT);  Surgeon: Marcine Matar, MD;  Location: [facility] ORS;  Service: Urology;  Laterality: N/A;   VIDEO ASSISTED THORACOSCOPY (VATS)/WEDGE RESECTION Right 03/30/2017   Procedure: VIDEO ASSISTED THORACOSCOPY (VATS)/WEDGE RESECTION;  Surgeon: Loreli Slot, MD;  Location: [facility] OR;  Service: Thoracic;  Laterality: Right;     Social History Social History   Tobacco Use   Smoking status: Former    Current packs/day: 0.00    Average packs/day: 1 pack/day for 51.0 years (51.0 ttl pk-yrs)    Types: Cigarettes    Start date: 11/25/1954    Quit date: 10/27/2005    Years since quitting: 17.4   Smokeless tobacco: Never  Vaping Use   Vaping status: Never Used  Substance Use Topics   Alcohol use: No    Alcohol/week: 0.0 standard drinks of alcohol    Comment: none since 1999   Drug use: No    Family History Family History  Problem Relation Age of Onset   Heart disease Father        Heart Disease before age 26   Alcohol abuse Father    Heart attack Father    Cancer Mother        Lung   COPD Mother    COPD Sister    Lupus Sister    Hypertension Brother    Gout Brother    Heart attack Son    Gout Brother    Prostatitis Brother     Allergies  Allergies  Allergen Reactions   Lovenox [Enoxaparin]     HIT panel ordered 10/10     Current Outpatient Medications  Medication Sig Dispense Refill   albuterol (VENTOLIN HFA) 108 (90 Base) MCG/ACT inhaler Inhale into the lungs.     alfuzosin (UROXATRAL) 10 MG 24 hr tablet Take 1 tablet (10 mg total) by mouth at bedtime. 90 tablet 1   amLODipine (NORVASC) 10 MG tablet Take 1 tablet (10 mg total) by mouth daily. 90 tablet 1   apixaban (ELIQUIS) 5 MG TABS tablet Take 1 tablet (5 mg total) by mouth 2 (two) times daily. 180 tablet 1   atorvastatin (LIPITOR) 40 MG tablet Take 1 tablet (40 mg total) by mouth daily. 90 tablet 1   cholecalciferol (VITAMIN D) 1000 UNITS tablet Take 1 tablet (1,000 Units total) by mouth daily. 30 tablet 6   ferrous gluconate (FERGON) 324 MG tablet TAKE ONE TABLET TWICE DAILY WITH meals 180 tablet 1   levothyroxine (SYNTHROID) 88 MCG tablet Take 1 tablet (88 mcg total) by mouth daily. 90 tablet 1   lisinopril (ZESTRIL) 40 MG tablet Take 1 tablet (40 mg total)  by mouth daily. 90 tablet 1   metoprolol succinate (TOPROL-XL) 25 MG 24 hr tablet Take 0.5 tablets (12.5 mg total) by mouth daily. 45 tablet 1   Multiple Vitamin (MULTIVITAMIN WITH MINERALS) TABS tablet Take 1 tablet by mouth daily. One A Day Multivitamin for Men     Omega-3 Fatty Acids (FISH OIL) 1200 MG CAPS Take 2,400 mg by mouth daily.      pantoprazole (PROTONIX) 40 MG tablet TAKE ONE TABLET TWICE DAILY 180 tablet 1   No current facility-administered medications for this visit.    ROS:   General:  No weight loss, Fever, chills  HEENT: No recent headaches, no nasal bleeding, no visual  changes, no sore throat  Neurologic: No dizziness, blackouts, seizures. No recent symptoms of stroke or mini- stroke. No recent episodes of slurred speech, or temporary blindness.  Cardiac: No recent episodes of chest pain/pressure, no shortness of breath at rest.  No shortness of breath with exertion.  Denies history of atrial fibrillation or irregular heartbeat  Vascular: No history of rest pain in feet.  No history of claudication.  No history of non-healing ulcer, No history of DVT   Pulmonary: No home oxygen, no productive cough, no hemoptysis,  No asthma or wheezing  Musculoskeletal:  [ ]  Arthritis, [ ]  Low back pain,  [ ]  Joint pain  Hematologic:No history of hypercoagulable state.  No history of easy bleeding.  No history of anemia  Gastrointestinal: No hematochezia or melena,  No gastroesophageal reflux, no trouble swallowing  Urinary: [ ]  chronic Kidney disease, [ ]  on HD - [ ]  MWF or [ ]  TTHS, [ ]  Burning with urination, [ ]  Frequent urination, [ ]  Difficulty urinating;   Skin: No rashes  Psychological: No history of anxiety,  No history of depression   Physical Examination  Vitals:   04/06/23 0907  BP: (!) 148/64  Pulse: (!) 55  Resp: 18  Temp: 98.1 F (36.7 C)  TempSrc: Temporal  SpO2: 94%  Weight: 162 lb 1.6 oz (73.5 kg)  Height: 5\' 10"  (1.778 m)    Body mass index is  23.26 kg/m.  General:  Alert and oriented, no acute distress HEENT: Normal Neck: No bruit or JVD Pulmonary: Clear to auscultation bilaterally Cardiac: Regular Rate and Rhythm without murmur Gastrointestinal: Soft, non-tender, non-distended, no mass, no scars Skin: No rash Extremity Pulses:  no ischemic changes well perfused extremities Musculoskeletal: No deformity or edema  Neurologic: Upper and lower extremity motor 5/5 and symmetric  DATA:  Endovascular Aortic Repair (EVAR):  +----------+----------------+-------------------+-------------------+           Diameter AP (cm)Diameter Trans (cm)Velocities (cm/sec)  +----------+----------------+-------------------+-------------------+  Aorta    4.12            3.79               56                   +----------+----------------+-------------------+-------------------+  Right Limb1.58            1.75               38                   +----------+----------------+-------------------+-------------------+  Left Limb 1.51            1.82               66                   +----------+----------------+-------------------+-------------------+   +-------------+-----------------------+  Endoleak Typepossible small endoleak  +-------------+-----------------------+    Summary:  Abdominal Aorta: Patent endovascular aneurysm repair with evidence of  possible small endoleak. The largest aortic diameter has decreased  compared to prior exam. Previous diameter measurement was 4.7 cm obtained  on 09/29/2022.    ASSESSMENT/PLAN: AAA s/p EVAR for > 5.1 cm AAA in 2011 by Dr. Antionette Char He denies symptoms of abd/lumbar pain He is very active and denies claudication, rest pain and nonhealing wounds.   The AAA duplex on his last visit demonstrated a change of 4.7 cm diameter, previously and today's is read at 4.1.  At this point  he is stable without symptoms of ischemia.  Plan for AAA duplex in 1 year.  If he has issues he will call  our office.  If he develops sudden abdominal pain or back pain out of the ordinary he will call 911.     Mosetta Pigeon PA-C [facility] [employer] Office: [phone]  MD on call Karin Lieu

## 2023-04-13 ENCOUNTER — Ambulatory Visit (HOSPITAL_COMMUNITY)
Admission: RE | Admit: 2023-04-13 | Discharge: 2023-04-13 | Disposition: A | Discharge status: Home / Self Care | Payer: Medicare Other | Source: Ambulatory Visit | Attending: Internal Medicine | Admitting: Internal Medicine

## 2023-04-13 ENCOUNTER — Inpatient Hospital Stay: Payer: Medicare Other | Attending: Internal Medicine

## 2023-04-13 DIAGNOSIS — Z87891 Personal history of nicotine dependence: Secondary | ICD-10-CM | POA: Insufficient documentation

## 2023-04-13 DIAGNOSIS — I4819 Other persistent atrial fibrillation: Secondary | ICD-10-CM | POA: Insufficient documentation

## 2023-04-13 DIAGNOSIS — Z79899 Other long term (current) drug therapy: Secondary | ICD-10-CM | POA: Insufficient documentation

## 2023-04-13 DIAGNOSIS — Z85118 Personal history of other malignant neoplasm of bronchus and lung: Secondary | ICD-10-CM | POA: Insufficient documentation

## 2023-04-13 DIAGNOSIS — C3490 Malignant neoplasm of unspecified part of unspecified bronchus or lung: Secondary | ICD-10-CM | POA: Insufficient documentation

## 2023-04-13 DIAGNOSIS — I10 Essential (primary) hypertension: Secondary | ICD-10-CM | POA: Insufficient documentation

## 2023-04-13 DIAGNOSIS — Z902 Acquired absence of lung [part of]: Secondary | ICD-10-CM | POA: Insufficient documentation

## 2023-04-13 DIAGNOSIS — Z7901 Long term (current) use of anticoagulants: Secondary | ICD-10-CM | POA: Insufficient documentation

## 2023-04-13 DIAGNOSIS — I700 Atherosclerosis of aorta: Secondary | ICD-10-CM | POA: Diagnosis not present

## 2023-04-13 DIAGNOSIS — J439 Emphysema, unspecified: Secondary | ICD-10-CM | POA: Diagnosis not present

## 2023-04-13 LAB — CBC WITH DIFFERENTIAL (CANCER CENTER ONLY)
Abs Immature Granulocytes: 0.01 10*3/uL (ref 0.00–0.07)
Basophils Absolute: 0 10*3/uL (ref 0.0–0.1)
Basophils Relative: 0 %
Eosinophils Absolute: 0.2 10*3/uL (ref 0.0–0.5)
Eosinophils Relative: 3 %
HCT: 35.8 % — ABNORMAL LOW (ref 39.0–52.0)
Hemoglobin: 12 g/dL — ABNORMAL LOW (ref 13.0–17.0)
Immature Granulocytes: 0 %
Lymphocytes Relative: 27 %
Lymphs Abs: 1.4 10*3/uL (ref 0.7–4.0)
MCH: 31 pg (ref 26.0–34.0)
MCHC: 33.5 g/dL (ref 30.0–36.0)
MCV: 92.5 fL (ref 80.0–100.0)
Monocytes Absolute: 0.4 10*3/uL (ref 0.1–1.0)
Monocytes Relative: 8 %
Neutro Abs: 3.1 10*3/uL (ref 1.7–7.7)
Neutrophils Relative %: 62 %
Platelet Count: 133 10*3/uL — ABNORMAL LOW (ref 150–400)
RBC: 3.87 MIL/uL — ABNORMAL LOW (ref 4.22–5.81)
RDW: 13.6 % (ref 11.5–15.5)
WBC Count: 5.1 10*3/uL (ref 4.0–10.5)
nRBC: 0 % (ref 0.0–0.2)

## 2023-04-13 LAB — CMP (CANCER CENTER ONLY)
ALT: 14 U/L (ref 0–44)
AST: 25 U/L (ref 15–41)
Albumin: 4 g/dL (ref 3.5–5.0)
Alkaline Phosphatase: 66 U/L (ref 38–126)
Anion gap: 6 (ref 5–15)
BUN: 18 mg/dL (ref 8–23)
CO2: 25 mmol/L (ref 22–32)
Calcium: 9 mg/dL (ref 8.9–10.3)
Chloride: 110 mmol/L (ref 98–111)
Creatinine: 1.12 mg/dL (ref 0.61–1.24)
GFR, Estimated: >60 mL/min (ref >60)
Glucose, Bld: 97 mg/dL (ref 70–99)
Potassium: 3.9 mmol/L (ref 3.5–5.1)
Sodium: 141 mmol/L (ref 135–145)
Total Bilirubin: 0.8 mg/dL (ref 0.3–1.2)
Total Protein: 7.9 g/dL (ref 6.5–8.1)

## 2023-04-13 MED ORDER — IOHEXOL 300 MG/ML  SOLN
75.0000 mL | Freq: Once | INTRAMUSCULAR | Status: AC | PRN
  Administered 2023-04-13: 75 mL via INTRAVENOUS

## 2023-04-13 MED ORDER — SODIUM CHLORIDE (PF) 0.9 % IJ SOLN
INTRAMUSCULAR | Status: AC
Start: 2023-04-13 — End: ?
  Filled 2023-04-13: qty 50

## 2023-04-16 ENCOUNTER — Other Ambulatory Visit: Payer: Self-pay

## 2023-04-16 ENCOUNTER — Inpatient Hospital Stay: Payer: Medicare Other | Admitting: Internal Medicine

## 2023-04-16 VITALS — BP 129/68 | Temp 98.0°F | Resp 17 | Ht 70.0 in | Wt 166.0 lb

## 2023-04-16 DIAGNOSIS — Z79899 Other long term (current) drug therapy: Secondary | ICD-10-CM | POA: Diagnosis not present

## 2023-04-16 DIAGNOSIS — C3490 Malignant neoplasm of unspecified part of unspecified bronchus or lung: Secondary | ICD-10-CM | POA: Diagnosis not present

## 2023-04-16 DIAGNOSIS — I10 Essential (primary) hypertension: Secondary | ICD-10-CM | POA: Diagnosis not present

## 2023-04-16 DIAGNOSIS — Z902 Acquired absence of lung [part of]: Secondary | ICD-10-CM | POA: Diagnosis not present

## 2023-04-16 DIAGNOSIS — Z7901 Long term (current) use of anticoagulants: Secondary | ICD-10-CM | POA: Diagnosis not present

## 2023-04-16 DIAGNOSIS — Z85118 Personal history of other malignant neoplasm of bronchus and lung: Secondary | ICD-10-CM | POA: Diagnosis not present

## 2023-04-16 DIAGNOSIS — Z87891 Personal history of nicotine dependence: Secondary | ICD-10-CM | POA: Diagnosis not present

## 2023-04-16 DIAGNOSIS — I4819 Other persistent atrial fibrillation: Secondary | ICD-10-CM | POA: Diagnosis not present

## 2023-04-16 NOTE — Progress Notes (Signed)
[facility] [phone]   [phone]  OFFICE PROGRESS NOTE  [name] Pierini, FNP [address] [place] [address]  DIAGNOSIS: Stage IA (T1a, N0, M0) non-small cell lung cancer, squamous cell carcinoma presented with right upper lobe lung nodule   PRIOR THERAPY: status post right upper lobectomy with lymph node dissection on March 30, 2017 under the care of Dr. Dorris Fetch.  CURRENT THERAPY: Observation.  INTERVAL HISTORY: [name] [name] 82 y.o. male returns to the clinic today for annual follow-up visit accompanied by his wife.Discussed the use of AI scribe software for clinical note transcription with the patient, who gave verbal consent to proceed.  History of Present Illness   The patient, an 82 year old with a history of stage 1A non-small cell lung cancer (squamous cell carcinoma), underwent a right upper lobectomy in October 2018. He has been on annual observation since then. He also has a history of atrial fibrillation and is on multiple medications including Amlodipine 10mg , Metoprolol 12.5mg , and Lisinopril 40mg .  Recently, he received the COVID-19 vaccine and experienced aches in his arms and hands, particularly severe on the night following the vaccination. However, these symptoms have since resolved and he reports feeling fine at the time of the consultation.  The patient also has regular follow-ups with a cardiologist for his atrial fibrillation. He expresses a desire to continue annual check-ups for his lung cancer, indicating a proactive approach to his health management.       MEDICAL HISTORY: Past Medical History:  Diagnosis Date   AAA (abdominal aortic aneurysm) (HCC)    Aortic stenosis, severe    a. 02/2017: s/p TAVR with Edwards Sapien 3 THV (size 26 mm, model # 9600TFX, serial # [id number])   Arthritis    Atherosclerotic peripheral vascular disease (HCC)    Atrial fibrillation, persistent (HCC)    a. not  a candidate for OAC given hx of GI bleed   Bilateral carotid artery stenosis    Bladder cancer (HCC)    COPD (chronic obstructive pulmonary disease) (HCC)    Gastroesophageal reflux disease without esophagitis 05/25/2019   Hyperlipidemia    Hypertension    Hypothyroidism    Lung cancer (HCC)    Right   Lung mass    Right   Skin cancer    Vitamin D deficiency     ALLERGIES:  is allergic to lovenox [enoxaparin].  MEDICATIONS:  Current Outpatient Medications  Medication Sig Dispense Refill   albuterol (VENTOLIN HFA) 108 (90 Base) MCG/ACT inhaler Inhale into the lungs.     alfuzosin (UROXATRAL) 10 MG 24 hr tablet Take 1 tablet (10 mg total) by mouth at bedtime. 90 tablet 1   amLODipine (NORVASC) 10 MG tablet Take 1 tablet (10 mg total) by mouth daily. 90 tablet 1   apixaban (ELIQUIS) 5 MG TABS tablet Take 1 tablet (5 mg total) by mouth 2 (two) times daily. 180 tablet 1   atorvastatin (LIPITOR) 40 MG tablet Take 1 tablet (40 mg total) by mouth daily. 90 tablet 1   cholecalciferol (VITAMIN D) 1000 UNITS tablet Take 1 tablet (1,000 Units total) by mouth daily. 30 tablet 6   ferrous gluconate (FERGON) 324 MG tablet TAKE ONE TABLET TWICE DAILY WITH meals 180 tablet 1   levothyroxine (SYNTHROID) 88 MCG tablet Take 1 tablet (88 mcg total) by mouth daily. 90 tablet 1   lisinopril (ZESTRIL) 40 MG tablet Take 1 tablet (40 mg total) by mouth daily. 90 tablet 1  metoprolol succinate (TOPROL-XL) 25 MG 24 hr tablet Take 0.5 tablets (12.5 mg total) by mouth daily. 45 tablet 1   Multiple Vitamin (MULTIVITAMIN WITH MINERALS) TABS tablet Take 1 tablet by mouth daily. One A Day Multivitamin for Men     Omega-3 Fatty Acids (FISH OIL) 1200 MG CAPS Take 2,400 mg by mouth daily.      pantoprazole (PROTONIX) 40 MG tablet TAKE ONE TABLET TWICE DAILY 180 tablet 1   No current facility-administered medications for this visit.    SURGICAL HISTORY:  Past Surgical History:  Procedure Laterality Date    ABDOMINAL AORTIC ANEURYSM REPAIR  08/09/2009   BACK SURGERY     x2   CATARACT EXTRACTION BILATERAL W/ ANTERIOR VITRECTOMY Bilateral 2022   CYSTOSCOPY W/ RETROGRADES Bilateral 09/02/2018   Procedure: CYSTOSCOPY WITH RETROGRADE PYELOGRAM;  Surgeon: Marcine Matar, MD;  Location: [facility];  Service: Urology;  Laterality: Bilateral;   CYSTOSCOPY W/ RETROGRADES Bilateral 03/20/2020   Procedure: CYSTOSCOPY WITH BILATERAL RETROGRADE PYELOGRAM;  Surgeon: Marcine Matar, MD;  Location: [facility];  Service: Urology;  Laterality: Bilateral;   CYSTOSCOPY WITH BIOPSY N/A 09/02/2018   Procedure: CYSTOSCOPY WITH BIOPSY;  Surgeon: Marcine Matar, MD;  Location: [facility];  Service: Urology;  Laterality: N/A;   ESOPHAGOGASTRODUODENOSCOPY N/A 02/21/2017   Procedure: ESOPHAGOGASTRODUODENOSCOPY (EGD);  Surgeon: Beverley Fiedler, MD;  Location[facility];  Service: Gastroenterology;  Laterality: N/A;   HYDROCELE EXCISION / REPAIR     LOBECTOMY Right 03/30/2017   Procedure: RIGHT UPPER LOBECTOMY LUNG;  Surgeon: Loreli Slot, MD;  Location: [facility] OR;  Service: Thoracic;  Laterality: Right;   SKIN CANCER EXCISION     SPINE SURGERY     TEE WITHOUT CARDIOVERSION N/A 02/24/2017   Procedure: TRANSESOPHAGEAL ECHOCARDIOGRAM (TEE);  Surgeon: Tonny Bollman, MD;  Location: [facility] OR;  Service: Open Heart Surgery;  Laterality: N/A;   TRANSCATHETER AORTIC VALVE REPLACEMENT, TRANSFEMORAL N/A 02/24/2017   Procedure: TRANSCATHETER AORTIC VALVE REPLACEMENT, TRANSFEMORAL using a 26mm Edwards Sapien 3 Aortic Valve;  Surgeon: Tonny Bollman, MD;  Location: [facility] OR;  Service: Open Heart Surgery;  Laterality: N/A;   TRANSURETHRAL RESECTION OF BLADDER TUMOR N/A 06/06/2016   Procedure: TRANSURETHRAL RESECTION OF BLADDER TUMOR (TURBT);  Surgeon: Marcine Matar, MD;  Location: [facility];  Service: Urology;  Laterality: N/A;   TRANSURETHRAL RESECTION OF BLADDER TUMOR N/A 07/22/2016   Procedure:  TRANSURETHRAL RESECTION OF BLADDER TUMOR (TURBT);  Surgeon: Marcine Matar, MD;  Location: [facility];  Service: Urology;  Laterality: N/A;   TRANSURETHRAL RESECTION OF BLADDER TUMOR N/A 03/20/2020   Procedure: TRANSURETHRAL RESECTION OF BLADDER TUMOR (TURBT);  Surgeon: Marcine Matar, MD;  Location: [facility];  Service: Urology;  Laterality: N/A;   VIDEO ASSISTED THORACOSCOPY (VATS)/WEDGE RESECTION Right 03/30/2017   Procedure: VIDEO ASSISTED THORACOSCOPY (VATS)/WEDGE RESECTION;  Surgeon: Loreli Slot, MD;  Location: [facility] OR;  Service: Thoracic;  Laterality: Right;    REVIEW OF SYSTEMS:  A comprehensive review of systems was negative.   PHYSICAL EXAMINATION: General appearance: alert, cooperative, and no distress Head: Normocephalic, without obvious abnormality, atraumatic Neck: no adenopathy, no JVD, supple, symmetrical, trachea midline, and thyroid not enlarged, symmetric, no tenderness/mass/nodules Lymph nodes: Cervical, supraclavicular, and axillary nodes normal. Resp: clear to auscultation bilaterally Back: symmetric, no curvature. ROM normal. No CVA tenderness. Cardio: irregularly irregular rhythm GI: soft, non-tender; bowel sounds normal; no masses,  no organomegaly Extremities: extremities normal, atraumatic, no cyanosis or edema  ECOG PERFORMANCE STATUS: 1 - Symptomatic but completely ambulatory  Blood pressure 129/68, temperature 98 F (36.7 C), temperature source Oral, resp. rate 17, height 5\' 10"  (1.778 m), weight 166 lb (75.3 kg), SpO2 99%.  LABORATORY DATA: Lab Results  Component Value Date   WBC 5.1 04/13/2023   HGB 12.0 (L) 04/13/2023   HCT 35.8 (L) 04/13/2023   MCV 92.5 04/13/2023   PLT 133 (L) 04/13/2023      Chemistry      Component Value Date/Time   NA 141 04/13/2023 1044   NA 142 12/11/2022 0834   NA 140 05/07/2017 1419   K 3.9 04/13/2023 1044   K 4.2 05/07/2017 1419   CL 110 04/13/2023 1044   CO2 25 04/13/2023 1044   CO2 25 05/07/2017 1419    BUN 18 04/13/2023 1044   BUN 19 12/11/2022 0834   BUN 14.5 05/07/2017 1419   CREATININE 1.12 04/13/2023 1044   CREATININE 1.0 05/07/2017 1419      Component Value Date/Time   CALCIUM 9.0 04/13/2023 1044   CALCIUM 9.6 05/07/2017 1419   ALKPHOS 66 04/13/2023 1044   ALKPHOS 85 05/07/2017 1419   AST 25 04/13/2023 1044   AST 23 05/07/2017 1419   ALT 14 04/13/2023 1044   ALT 14 05/07/2017 1419   BILITOT 0.8 04/13/2023 1044   BILITOT 0.48 05/07/2017 1419       RADIOGRAPHIC STUDIES: CT Chest W Contrast  Result Date: 04/16/2023 CLINICAL DATA:  Non-small cell lung cancer restaging * Tracking Code: BO * EXAM: CT CHEST WITH CONTRAST TECHNIQUE: Multidetector CT imaging of the chest was performed during intravenous contrast administration. RADIATION DOSE REDUCTION: This exam was performed according to the departmental dose-optimization program which includes automated exposure control, adjustment of the mA and/or kV according to patient size and/or use of iterative reconstruction technique. CONTRAST:  75mL OMNIPAQUE IOHEXOL 300 MG/ML  SOLN COMPARISON:  04/14/2022 FINDINGS: Cardiovascular: Coronary, aortic arch, and branch vessel atherosclerotic vascular disease. Mildly prominent central pulmonary arteries, raising the possibility of pulmonary arterial hypertension. 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 TAVR noted. Mediastinum/Nodes: Unremarkable Lungs/Pleura: Emphysema. Right upper lobectomy. Posterior-inferior scarring in both lower lobes. No recurrent tumor identified. Upper Abdomen: Cholelithiasis. Abdominal aortic atherosclerosis with substantial plaque proximally in the SMA but no findings of SMA occlusion based on contrast opacification. There is also atheromatous plaque at the origin of the celiac trunk, without overt occlusion. An abdominal aortic stent is visible on the scout images. Musculoskeletal: Old healed right lateral rib fractures. IMPRESSION: 1. No findings  of recurrent tumor or metastatic disease in the chest. 2. Emphysema. 3. Coronary, aortic arch, and branch vessel atherosclerotic vascular disease. 4. Mildly prominent central pulmonary arteries, raising the possibility of pulmonary arterial hypertension. 5. Cholelithiasis. 6. 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 7. Old healed right lateral rib fractures. Aortic Atherosclerosis (ICD10-I70.0) and Emphysema (ICD10-J43.9). Electronically Signed   By: Gaylyn Rong M.D.   On: 04/16/2023 08:52   VAS [place] EVAR DUPLEX  Result Date: 04/06/2023 Endovascular Aortic Repair Study (EVAR) Patient Name:  [name] [name]  Date of Exam:   04/06/2023 Medical Rec #: [id number]           Accession #:    [id number] Date of Birth: 12/17/1940            Patient Gender: M Patient Age:   13 years Exam Location:  [facility] Procedure:      VAS [place] EVAR DUPLEX Referring Phys: Coral Else --------------------------------------------------------------------------------  Indications: Follow up exam for EVAR. Risk Factors: Hypertension, hyperlipidemia, past history of smoking, coronary               artery disease. Limitations: Air/bowel gas.  Comparison Study: 09/29/2022 EVAR duplex- Abdominal Aorta: Patent endovascular                   aneurysm repair with no evidence of                   endoleak. See above for comparison. Performing Technologist: Gertie Fey MHA, RDMS, RVT, RDCS  Examination Guidelines: A complete evaluation includes B-mode imaging, spectral Doppler, color Doppler, and power Doppler as needed of all accessible portions of each vessel. Bilateral testing is considered an integral part of a complete examination. Limited examinations for reoccurring indications may be performed as noted.  Endovascular Aortic Repair (EVAR): +----------+----------------+-------------------+-------------------+           Diameter AP (cm)Diameter Trans (cm)Velocities (cm/sec)  +----------+----------------+-------------------+-------------------+ Aorta     4.12            3.79               56                  +----------+----------------+-------------------+-------------------+ Right Limb1.58            1.75               38                  +----------+----------------+-------------------+-------------------+ Left Limb 1.51            1.82               66                  +----------+----------------+-------------------+-------------------+ +-------------+-----------------------+ Endoleak Typepossible small endoleak +-------------+-----------------------+  Summary: Abdominal Aorta: Patent endovascular aneurysm repair with evidence of possible small endoleak. The largest aortic diameter has decreased compared to prior exam. Previous diameter measurement was 4.7 cm obtained on 09/29/2022.  *See table(s) above for measurements and observations.  Electronically signed by Carolynn Sayers on 04/06/2023 at 9:42:54 AM.    Final      ASSESSMENT AND PLAN: This is a very pleasant 82 years old white male with a stage IA non-small cell lung cancer status post right upper lobectomy with lymph node dissection in October 2018. The patient has been on observation since that time and he is feeling fine with no concerning complaints except for the atrial fibrillation managed by his cardiologist. He had repeat CT scan of the chest performed recently.  I personally and independently reviewed the scan and discussed the result with the patient and his wife.    Stage 1A Non-Small Cell Lung Cancer (Squamous Cell Carcinoma) Status post right upper lobectomy in 2018. No evidence of recurrence or spread on recent imaging. Patient prefers continued annual follow-up. -Schedule annual follow-up with repeat chest scan.  Atrial Fibrillation Known history, currently managed with Metoprolol 12.5mg  daily. Patient reports intermittent symptoms. -Continue Metoprolol 12.5mg  daily. -Ensure  follow-up and evaluation with cardiologist.  Hypertension Managed with Amlodipine 10mg  daily and Lisinopril 40mg  daily. -Continue Amlodipine 10mg  daily and Lisinopril 40mg  daily.  Post COVID-19 Vaccination Recent vaccination with transient arm pain and aches. -No further action required at this time.  General Health Maintenance / Followup Plans -Schedule annual follow-up with chest scan. -Ensure follow-up and evaluation with cardiologist.   The patient was advised to  call immediately if he has any other concerning symptoms in the interval. The patient voices understanding of current disease status and treatment options and is in agreement with the current care plan. All questions were answered. The patient knows to call the clinic with any problems, questions or concerns. We can certainly see the patient much sooner if necessary.    Disclaimer: This note was dictated with voice recognition software. Similar sounding words can inadvertently be transcribed and may not be corrected upon review.

## 2023-04-28 DIAGNOSIS — Z20822 Contact with and (suspected) exposure to covid-19: Secondary | ICD-10-CM | POA: Diagnosis not present

## 2023-04-28 DIAGNOSIS — R070 Pain in throat: Secondary | ICD-10-CM | POA: Diagnosis not present

## 2023-04-28 DIAGNOSIS — J209 Acute bronchitis, unspecified: Secondary | ICD-10-CM | POA: Diagnosis not present

## 2023-05-01 ENCOUNTER — Other Ambulatory Visit: Payer: Self-pay

## 2023-05-01 DIAGNOSIS — Z95828 Presence of other vascular implants and grafts: Secondary | ICD-10-CM

## 2023-06-12 ENCOUNTER — Ambulatory Visit (INDEPENDENT_AMBULATORY_CARE_PROVIDER_SITE_OTHER): Payer: Medicare Other | Admitting: Nurse Practitioner

## 2023-06-12 ENCOUNTER — Encounter: Payer: Self-pay | Admitting: Nurse Practitioner

## 2023-06-12 VITALS — BP 133/66 | HR 56 | Temp 97.7°F | Ht 70.0 in | Wt 163.0 lb

## 2023-06-12 DIAGNOSIS — C3491 Malignant neoplasm of unspecified part of right bronchus or lung: Secondary | ICD-10-CM

## 2023-06-12 DIAGNOSIS — Z6826 Body mass index (BMI) 26.0-26.9, adult: Secondary | ICD-10-CM | POA: Diagnosis not present

## 2023-06-12 DIAGNOSIS — E782 Mixed hyperlipidemia: Secondary | ICD-10-CM

## 2023-06-12 DIAGNOSIS — E559 Vitamin D deficiency, unspecified: Secondary | ICD-10-CM | POA: Diagnosis not present

## 2023-06-12 DIAGNOSIS — E034 Atrophy of thyroid (acquired): Secondary | ICD-10-CM

## 2023-06-12 DIAGNOSIS — K219 Gastro-esophageal reflux disease without esophagitis: Secondary | ICD-10-CM

## 2023-06-12 DIAGNOSIS — I739 Peripheral vascular disease, unspecified: Secondary | ICD-10-CM | POA: Diagnosis not present

## 2023-06-12 DIAGNOSIS — I4821 Permanent atrial fibrillation: Secondary | ICD-10-CM

## 2023-06-12 DIAGNOSIS — I10 Essential (primary) hypertension: Secondary | ICD-10-CM

## 2023-06-12 DIAGNOSIS — J449 Chronic obstructive pulmonary disease, unspecified: Secondary | ICD-10-CM | POA: Diagnosis not present

## 2023-06-12 DIAGNOSIS — I25118 Atherosclerotic heart disease of native coronary artery with other forms of angina pectoris: Secondary | ICD-10-CM

## 2023-06-12 LAB — LIPID PANEL

## 2023-06-12 NOTE — Patient Instructions (Signed)
Fall Prevention in the Home, Adult Falls can cause injuries and can happen to people of all ages. There are many things you can do to make your home safer and to help prevent falls. What actions can I take to prevent falls? General information Use good lighting in all rooms. Make sure to: Replace any light bulbs that burn out. Turn on the lights in dark areas and use night-lights. Keep items that you use often in easy-to-reach places. Lower the shelves around your home if needed. Move furniture so that there are clear paths around it. Do not use throw rugs or other things on the floor that can make you trip. If any of your floors are uneven, fix them. Add color or contrast paint or tape to clearly mark and help you see: Grab bars or handrails. First and last steps of staircases. Where the edge of each step is. If you use a ladder or stepladder: Make sure that it is fully opened. Do not climb a closed ladder. Make sure the sides of the ladder are locked in place. Have someone hold the ladder while you use it. Know where your pets are as you move through your home. What can I do in the bathroom?     Keep the floor dry. Clean up any water on the floor right away. Remove soap buildup in the bathtub or shower. Buildup makes bathtubs and showers slippery. Use non-skid mats or decals on the floor of the bathtub or shower. Attach bath mats securely with double-sided, non-slip rug tape. If you need to sit down in the shower, use a non-slip stool. Install grab bars by the toilet and in the bathtub and shower. Do not use towel bars as grab bars. What can I do in the bedroom? Make sure that you have a light by your bed that is easy to reach. Do not use any sheets or blankets on your bed that hang to the floor. Have a firm chair or bench with side arms that you can use for support when you get dressed. What can I do in the kitchen? Clean up any spills right away. If you need to reach something  above you, use a step stool with a grab bar. Keep electrical cords out of the way. Do not use floor polish or wax that makes floors slippery. What can I do with my stairs? Do not leave anything on the stairs. Make sure that you have a light switch at the top and the bottom of the stairs. Make sure that there are handrails on both sides of the stairs. Fix handrails that are broken or loose. Install non-slip stair treads on all your stairs if they do not have carpet. Avoid having throw rugs at the top or bottom of the stairs. Choose a carpet that does not hide the edge of the steps on the stairs. Make sure that the carpet is firmly attached to the stairs. Fix carpet that is loose or worn. What can I do on the outside of my home? Use bright outdoor lighting. Fix the edges of walkways and driveways and fix any cracks. Clear paths of anything that can make you trip, such as tools or rocks. Add color or contrast paint or tape to clearly mark and help you see anything that might make you trip as you walk through a door, such as a raised step or threshold. Trim any bushes or trees on paths to your home. Check to see if handrails are loose   or broken and that both sides of all steps have handrails. Install guardrails along the edges of any raised decks and porches. Have leaves, snow, or ice cleared regularly. Use sand, salt, or ice melter on paths if you live where there is ice and snow during the winter. Clean up any spills in your garage right away. This includes grease or oil spills. What other actions can I take? Review your medicines with your doctor. Some medicines can cause dizziness or changes in blood pressure, which increase your risk of falling. Wear shoes that: Have a low heel. Do not wear high heels. Have rubber bottoms and are closed at the toe. Feel good on your feet and fit well. Use tools that help you move around if needed. These include: Canes. Walkers. Scooters. Crutches. Ask  your doctor what else you can do to help prevent falls. This may include seeing a physical therapist to learn to do exercises to move better and get stronger. Where to find more information Centers for Disease Control and Prevention, [employer]: cdc.gov National Institute on Aging: nia.nih.gov National Institute on Aging: nia.nih.gov Contact a doctor if: You are afraid of falling at home. You feel weak, drowsy, or dizzy at home. You fall at home. Get help right away if you: Lose consciousness or have trouble moving after a fall. Have a fall that causes a head injury. These symptoms may be an emergency. Get help right away. Call 911. Do not wait to see if the symptoms will go away. Do not drive yourself to the hospital. This information is not intended to replace advice given to you by your health care provider. Make sure you discuss any questions you have with your health care provider. Document Revised: 02/10/2022 Document Reviewed: 02/10/2022 Elsevier Patient Education  2024 [employer]  

## 2023-06-12 NOTE — Progress Notes (Signed)
Subjective:    Patient ID: [name] [name], male    DOB: [birth year], 82 y.o.   MRN: [id number]   Chief Complaint: medical management of chronic issues     HPI:  [name] [name] is a 82 y.o. who identifies as a male who was assigned male at birth.   Social history: Lives with: wife Work history: retired from [employer] in today for follow up of the following chronic medical issues:  1. Primary hypertension No c/o chest pain, sob or headache. Doe snot check blood pressure at home. BP Readings from Last 3 Encounters:  04/16/23 129/68  04/06/23 (!) 148/64  12/11/22 128/63    2. Nonrheumatic aortic valve stenosis 3. Bilateral carotid artery stenosis 4. PVD (peripheral vascular disease) (HCC) 5. Permanent atrial fibrillation (HCC) 6. Coronary artery disease of native artery of native heart with stable angina pectoris [facility]) Last saw cardiology on 11/26/22. Review of office note showed no change in plan of care. He is to follow up in one year.  7. Chronic obstructive pulmonary disease, unspecified COPD type (HCC) Says he is having no breathing issues. Is currently on no inhalers  8. Primary lung cancer, right (HCC) Was in 2019 and he had surgery and is doing well. No cancer reoccurence. Last saw oncology on 04/16/23. Will continue yearly follow up.  9. Gastroesophageal reflux disease without esophagitis Takes protonix daily and is doing well.  10. Hypothyroidism due to acquired atrophy of thyroid No issues that he is aware of. Lab Results  Component Value Date   TSH 3.550 12/11/2022     11. Mixed hyperlipidemia Does watch diet and stays very active Lab Results  Component Value Date   CHOL 103 12/11/2022   HDL 29 (L) 12/11/2022   LDLCALC 56 12/11/2022   TRIG 95 12/11/2022   CHOLHDL 3.6 12/11/2022     12. Benign prostatic hyperplasia without lower urinary tract symptoms Sees urology. Recently had procedure for bladder cancer. Denies any current voiding  issues  13. Vitamin D deficiency Is on vitamin d supplement  14. BMI 26.0-26.9,adult No recent weight changes Wt Readings from Last 3 Encounters:  06/12/23 163 lb (73.9 kg)  04/16/23 166 lb (75.3 kg)  04/06/23 162 lb 1.6 oz (73.5 kg)   BMI Readings from Last 3 Encounters:  06/12/23 23.39 kg/m  04/16/23 23.82 kg/m  04/06/23 23.26 kg/m     New complaints: None today  Allergies  Allergen Reactions   Lovenox [Enoxaparin]     HIT panel ordered 10/10   Outpatient Encounter Medications as of 06/12/2023  Medication Sig   albuterol (VENTOLIN HFA) 108 (90 Base) MCG/ACT inhaler Inhale into the lungs.   alfuzosin (UROXATRAL) 10 MG 24 hr tablet Take 1 tablet (10 mg total) by mouth at bedtime.   amLODipine (NORVASC) 10 MG tablet Take 1 tablet (10 mg total) by mouth daily.   apixaban (ELIQUIS) 5 MG TABS tablet Take 1 tablet (5 mg total) by mouth 2 (two) times daily.   atorvastatin (LIPITOR) 40 MG tablet Take 1 tablet (40 mg total) by mouth daily.   cholecalciferol (VITAMIN D) 1000 UNITS tablet Take 1 tablet (1,000 Units total) by mouth daily.   ferrous gluconate (FERGON) 324 MG tablet TAKE ONE TABLET TWICE DAILY WITH meals   levothyroxine (SYNTHROID) 88 MCG tablet Take 1 tablet (88 mcg total) by mouth daily.   lisinopril (ZESTRIL) 40 MG tablet Take 1 tablet (40 mg total) by mouth daily.   metoprolol succinate (TOPROL-XL) 25  MG 24 hr tablet Take 0.5 tablets (12.5 mg total) by mouth daily.   Multiple Vitamin (MULTIVITAMIN WITH MINERALS) TABS tablet Take 1 tablet by mouth daily. One A Day Multivitamin for Men   Omega-3 Fatty Acids (FISH OIL) 1200 MG CAPS Take 2,400 mg by mouth daily.    pantoprazole (PROTONIX) 40 MG tablet TAKE ONE TABLET TWICE DAILY   No facility-administered encounter medications on file as of 06/12/2023.    Past Surgical History:  Procedure Laterality Date   ABDOMINAL AORTIC ANEURYSM REPAIR  08/09/2009   BACK SURGERY     x2   CATARACT EXTRACTION BILATERAL W/  ANTERIOR VITRECTOMY Bilateral 2022   CYSTOSCOPY W/ RETROGRADES Bilateral 09/02/2018   Procedure: CYSTOSCOPY WITH RETROGRADE PYELOGRAM;  Surgeon: Marcine Matar, MD;  Location[facility];  Service: Urology;  Laterality: Bilateral;   CYSTOSCOPY W/ RETROGRADES Bilateral 03/20/2020   Procedure: CYSTOSCOPY WITH BILATERAL RETROGRADE PYELOGRAM;  Surgeon: Marcine Matar, MD;  Location: [facility];  Service: Urology;  Laterality: Bilateral;   CYSTOSCOPY WITH BIOPSY N/A 09/02/2018   Procedure: CYSTOSCOPY WITH BIOPSY;  Surgeon: Marcine Matar, MD;  Location: [facility];  Service: Urology;  Laterality: N/A;   ESOPHAGOGASTRODUODENOSCOPY N/A 02/21/2017   Procedure: ESOPHAGOGASTRODUODENOSCOPY (EGD);  Surgeon: Beverley Fiedler, MD;  Location: [facility] ENDOSCOPY;  Service: Gastroenterology;  Laterality: N/A;   HYDROCELE EXCISION / REPAIR     LOBECTOMY Right 03/30/2017   Procedure: RIGHT UPPER LOBECTOMY LUNG;  Surgeon: Loreli Slot, MD;  Location: [facility] OR;  Service: Thoracic;  Laterality: Right;   SKIN CANCER EXCISION     SPINE SURGERY     TEE WITHOUT CARDIOVERSION N/A 02/24/2017   Procedure: TRANSESOPHAGEAL ECHOCARDIOGRAM (TEE);  Surgeon: Tonny Bollman, MD;  Location: [facility] OR;  Service: Open Heart Surgery;  Laterality: N/A;   TRANSCATHETER AORTIC VALVE REPLACEMENT, TRANSFEMORAL N/A 02/24/2017   Procedure: TRANSCATHETER AORTIC VALVE REPLACEMENT, TRANSFEMORAL using a 26mm Edwards Sapien 3 Aortic Valve;  Surgeon: Tonny Bollman, MD;  Location: [facility] OR;  Service: Open Heart Surgery;  Laterality: N/A;   TRANSURETHRAL RESECTION OF BLADDER TUMOR N/A 06/06/2016   Procedure: TRANSURETHRAL RESECTION OF BLADDER TUMOR (TURBT);  Surgeon: Marcine Matar, MD;  Location: [facility];  Service: Urology;  Laterality: N/A;   TRANSURETHRAL RESECTION OF BLADDER TUMOR N/A 07/22/2016   Procedure: TRANSURETHRAL RESECTION OF BLADDER TUMOR (TURBT);  Surgeon: Marcine Matar, MD;  Location: [facility];   Service: Urology;  Laterality: N/A;   TRANSURETHRAL RESECTION OF BLADDER TUMOR N/A 03/20/2020   Procedure: TRANSURETHRAL RESECTION OF BLADDER TUMOR (TURBT);  Surgeon: Marcine Matar, MD;  Location: [facility];  Service: Urology;  Laterality: N/A;   VIDEO ASSISTED THORACOSCOPY (VATS)/WEDGE RESECTION Right 03/30/2017   Procedure: VIDEO ASSISTED THORACOSCOPY (VATS)/WEDGE RESECTION;  Surgeon: Loreli Slot, MD;  Location: [facility] OR;  Service: Thoracic;  Laterality: Right;    Family History  Problem Relation Age of Onset   Heart disease Father        Heart Disease before age 66   Alcohol abuse Father    Heart attack Father    Cancer Mother        Lung   COPD Mother    COPD Sister    Lupus Sister    Hypertension Brother    Gout Brother    Heart attack Son    Gout Brother    Prostatitis Brother       Controlled substance contract: n/a     Review of Systems  Constitutional:  Negative for diaphoresis.  Eyes:  Negative for pain.  Respiratory:  Negative for shortness of breath.   Cardiovascular:  Negative for chest pain, palpitations and leg swelling.  Gastrointestinal:  Negative for abdominal pain.  Endocrine: Negative for polydipsia.  Skin:  Negative for rash.  Neurological:  Negative for dizziness, weakness and headaches.  Hematological:  Does not bruise/bleed easily.  All other systems reviewed and are negative.      Objective:   Physical Exam Vitals and nursing note reviewed.  Constitutional:      Appearance: Normal appearance. He is well-developed.  HENT:     Head: Normocephalic.     Nose: Nose normal.     Mouth/Throat:     Mouth: Mucous membranes are moist.     Pharynx: Oropharynx is clear.  Eyes:     Pupils: Pupils are equal, round, and reactive to light.  Neck:     Thyroid: No thyroid mass or thyromegaly.     Vascular: No carotid bruit or JVD.     Trachea: Phonation normal.  Cardiovascular:     Rate and Rhythm: Normal rate and regular rhythm.   Pulmonary:     Effort: Pulmonary effort is normal. No respiratory distress.     Breath sounds: Normal breath sounds.     Comments: Dry cough Abdominal:     General: Bowel sounds are normal.     Palpations: Abdomen is soft.     Tenderness: There is no abdominal tenderness.  Musculoskeletal:        General: Normal range of motion.     Cervical back: Normal range of motion and neck supple.  Lymphadenopathy:     Cervical: No cervical adenopathy.  Skin:    General: Skin is warm and dry.  Neurological:     Mental Status: He is alert and oriented to person, place, and time.  Psychiatric:        Behavior: Behavior normal.        Thought Content: Thought content normal.        Judgment: Judgment normal.     BP 133/66   Pulse (!) 56   Temp 97.7 F (36.5 C) (Temporal)   Ht 5\' 10"  (1.778 m)   Wt 163 lb (73.9 kg)   SpO2 95%   BMI 23.39 kg/m         Assessment & Plan:  [name] [name] comes in today with chief complaint of No chief complaint on file.   Diagnosis and orders addressed:  1. Primary hypertension Low sodium diet - CBC with Differential/Platelet - CMP14+EGFR - amLODipine (NORVASC) 10 MG tablet; Take 1 tablet (10 mg total) by mouth daily.  Dispense: 90 tablet; Refill: 1  2. Nonrheumatic aortic valve stenosis 3. Bilateral carotid artery stenosis 4. PVD (peripheral vascular disease) (HCC) 5. Permanent atrial fibrillation (HCC) - apixaban (ELIQUIS) 5 MG TABS tablet; Take 1 tablet (5 mg total) by mouth 2 (two) times daily.  Dispense: 180 tablet; Refill: 1  6. Coronary artery disease of native artery of native heart with stable angina pectoris (HCC) Yearly follow up with cardiology - metoprolol succinate (TOPROL-XL) 25 MG 24 hr tablet; Take 0.5 tablets (12.5 mg total) by mouth daily.  Dispense: 45 tablet; Refill: 1  7. Chronic obstructive pulmonary disease, unspecified COPD type (HCC)  8. Primary lung cancer, right (HCC)  9. Gastroesophageal reflux disease  without esophagitis Avoid spicy foods Do not eat 2 hours prior to bedtime - pantoprazole (PROTONIX) 40 MG tablet; TAKE ONE TABLET TWICE DAILY  Dispense: 180 tablet; Refill:  1  10. Hypothyroidism due to acquired atrophy of thyroid Labs oending - Thyroid Panel With TSH - levothyroxine (SYNTHROID) 88 MCG tablet; Take 1 tablet (88 mcg total) by mouth daily.  Dispense: 90 tablet; Refill: 1  11. Mixed hyperlipidemia Low fat diet - Lipid panel - atorvastatin (LIPITOR) 40 MG tablet; Take 1 tablet (40 mg total) by mouth daily.  Dispense: 90 tablet; Refill: 1 - lisinopril (ZESTRIL) 40 MG tablet; Take 1 tablet (40 mg total) by mouth daily.  Dispense: 90 tablet; Refill: 1  12. Benign prostatic hyperplasia without lower urinary tract symptoms Report any voiding issues - alfuzosin (UROXATRAL) 10 MG 24 hr tablet; Take 1 tablet (10 mg total) by mouth at bedtime.  Dispense: 90 tablet; Refill: 1  13. Vitamin D deficiency Continue vitamin d supplement  14. BMI 26.0-26.9,adult Discussed diet and exercise for person with BMI >25 Will recheck weight in 3-6 months    Labs pending Health Maintenance reviewed Diet and exercise encouraged  Follow up plan: 6 months   [name] Daphine Deutscher, FNP

## 2023-06-12 NOTE — Progress Notes (Signed)
   Subjective:    Patient ID: [name] [name], male    DOB: Mar 03, 1941, 82 y.o.   MRN: [id number]  HPI    Review of Systems     Objective:   Physical Exam        Assessment & Plan:

## 2023-06-13 LAB — CMP14+EGFR
ALT: 20 [IU]/L (ref 0–44)
AST: 33 [IU]/L (ref 0–40)
Albumin: 4.2 g/dL (ref 3.7–4.7)
Alkaline Phosphatase: 79 [IU]/L (ref 44–121)
BUN/Creatinine Ratio: 13 (ref 10–24)
BUN: 17 mg/dL (ref 8–27)
Bilirubin Total: 0.5 mg/dL (ref 0.0–1.2)
CO2: 24 mmol/L (ref 20–29)
Calcium: 9.5 mg/dL (ref 8.6–10.2)
Chloride: 105 mmol/L (ref 96–106)
Creatinine, Ser: 1.34 mg/dL — ABNORMAL HIGH (ref 0.76–1.27)
Globulin, Total: 3.6 g/dL (ref 1.5–4.5)
Glucose: 103 mg/dL — ABNORMAL HIGH (ref 70–99)
Potassium: 4.8 mmol/L (ref 3.5–5.2)
Sodium: 143 mmol/L (ref 134–144)
Total Protein: 7.8 g/dL (ref 6.0–8.5)
eGFR: 53 mL/min/{1.73_m2} — ABNORMAL LOW (ref >59)

## 2023-06-13 LAB — CBC WITH DIFFERENTIAL/PLATELET
Basophils Absolute: 0 10*3/uL (ref 0.0–0.2)
Basos: 1 %
EOS (ABSOLUTE): 0.2 10*3/uL (ref 0.0–0.4)
Eos: 3 %
Hematocrit: 38.2 % (ref 37.5–51.0)
Hemoglobin: 12.7 g/dL — ABNORMAL LOW (ref 13.0–17.7)
Immature Grans (Abs): 0 10*3/uL (ref 0.0–0.1)
Immature Granulocytes: 0 %
Lymphocytes Absolute: 1.4 10*3/uL (ref 0.7–3.1)
Lymphs: 23 %
MCH: 31.7 pg (ref 26.6–33.0)
MCHC: 33.2 g/dL (ref 31.5–35.7)
MCV: 95 fL (ref 79–97)
Monocytes Absolute: 0.5 10*3/uL (ref 0.1–0.9)
Monocytes: 8 %
Neutrophils Absolute: 3.9 10*3/uL (ref 1.4–7.0)
Neutrophils: 65 %
Platelets: 152 10*3/uL (ref 150–450)
RBC: 4.01 x10E6/uL — ABNORMAL LOW (ref 4.14–5.80)
RDW: 14 % (ref 11.6–15.4)
WBC: 6 10*3/uL (ref 3.4–10.8)

## 2023-06-13 LAB — LIPID PANEL
Cholesterol, Total: 115 mg/dL (ref 100–199)
HDL: 33 mg/dL — ABNORMAL LOW (ref >39)
LDL CALC COMMENT:: 3.5 ratio (ref 0.0–5.0)
LDL Chol Calc (NIH): 63 mg/dL (ref 0–99)
Triglycerides: 97 mg/dL (ref 0–149)
VLDL Cholesterol Cal: 19 mg/dL (ref 5–40)

## 2023-08-03 DIAGNOSIS — R059 Cough, unspecified: Secondary | ICD-10-CM | POA: Diagnosis not present

## 2023-08-03 DIAGNOSIS — Z20822 Contact with and (suspected) exposure to covid-19: Secondary | ICD-10-CM | POA: Diagnosis not present

## 2023-08-03 DIAGNOSIS — R070 Pain in throat: Secondary | ICD-10-CM | POA: Diagnosis not present

## 2023-08-17 NOTE — Progress Notes (Signed)
 History of Present Illness:  Here for bladder cancer surveillance.  He underwent emergent TURBT on 12.15.2017 for a large bladder cancer burden at his right bladder neck, posterior dome and left lateral wall. He did well after the procedure. He apparently been bleeding for 3 months. He is a cigarette smoker.    Pathology revealed high-grade, NMIBC. Muscle was present in the specimen.    He underwent repeat resection on 1.30.2018. 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    He was initiated on BCG and competed his 6 induction treatments on 3.27.2018.    5.15.2018 -he was found to have a small papillary recurrence on his anterior bladder wall--he underwent cysto/cauterization of this on 5.17.2018.  7.31.2018. 1st 3 maintenance BCG's completed  9.18.2018: Office cysto negative  10.24.2018: completed 3 maintenance BCG Rx's  1.22.2019: Surveillance cystoscopy.  2.19.2019: Completed 3 maintenance BCG treatments  4.2.2019: He has had no blood or burning with urination since his BCG has been completed. He tolerated the BCG well.  2.25.2020--Cysto--small BT recurrence.  3.12.2020: TURBT/bilateral RGPs. Cytologies of renal washings nml. Bladder tumor LG NMIBC.  6.2.2020: Cysto negative.  10.6.2020: Cystoscopy negative 3.2021: Cystoscopy negative, cytology with atypia 11.09.2021: Repeat TURBT/gemcitabine 9.28.2021. Path--benign (inflammation). 3.8.2022: Cysto negative, cytology--NEGATIVE FOR HIGH-GRADE UROTHELIAL CARCINOMA (NHGUC).  REACTIVE UROTHELIAL CELLS ARE PRESENT.  CELLULAR DEGENERATION IS PRESENT.  ACUTE INFLAMMATION AND BACTERIA ARE PRESENT.  9.6.2022: Cystoscopy negative.  Cytology negative. 3.7.2023: Cysto/cytology negative (culture revealed UTI). 3.5.2024: Cystoscopy was negative.  Cytology inconclusive 3.4.2025: Here today for routine check.  He has not had any blood in his urine.  He denies any significant lower urinary tract  symptoms.  Past Medical History:  Diagnosis Date   AAA (abdominal aortic aneurysm) (HCC)    Aortic stenosis, severe    a. 02/2017: s/p TAVR with Edwards Sapien 3 THV (size 26 mm, model # 9600TFX, serial # [id number])   Arthritis    Atherosclerotic peripheral vascular disease (HCC)    Atrial fibrillation, persistent (HCC)    a. not a candidate for [facility] given hx of GI bleed   Bilateral carotid artery stenosis    Bladder cancer (HCC)    COPD (chronic obstructive pulmonary disease) (HCC)    Gastroesophageal reflux disease without esophagitis 05/25/2019   Hyperlipidemia    Hypertension    Hypothyroidism    Lung cancer (HCC)    Right   Lung mass    Right   Skin cancer    Vitamin D deficiency     Past Surgical History:  Procedure Laterality Date   ABDOMINAL AORTIC ANEURYSM REPAIR  08/09/2009   BACK SURGERY     x2   CATARACT EXTRACTION BILATERAL W/ ANTERIOR VITRECTOMY Bilateral 2022   CYSTOSCOPY W/ RETROGRADES Bilateral 09/02/2018   Procedure: CYSTOSCOPY WITH RETROGRADE PYELOGRAM;  Surgeon: Marcine Matar, MD;  Location: [facility];  Service: Urology;  Laterality: Bilateral;   CYSTOSCOPY W/ RETROGRADES Bilateral 03/20/2020   Procedure: CYSTOSCOPY WITH BILATERAL RETROGRADE PYELOGRAM;  Surgeon: Marcine Matar, MD;  Location: [facility];  Service: Urology;  Laterality: Bilateral;   CYSTOSCOPY WITH BIOPSY N/A 09/02/2018   Procedure: CYSTOSCOPY WITH BIOPSY;  Surgeon: Marcine Matar, MD;  Location: [facility];  Service: Urology;  Laterality: N/A;   ESOPHAGOGASTRODUODENOSCOPY N/A 02/21/2017   Procedure: ESOPHAGOGASTRODUODENOSCOPY (EGD);  Surgeon: Beverley Fiedler, MD;  Location: [facility] ENDOSCOPY;  Service: Gastroenterology;  Laterality: N/A;   HYDROCELE EXCISION / REPAIR     LOBECTOMY Right 03/30/2017   Procedure: RIGHT  UPPER LOBECTOMY LUNG;  Surgeon: Loreli Slot, MD;  Location: [facility] OR;  Service: Thoracic;  Laterality: Right;   SKIN CANCER EXCISION      SPINE SURGERY     TEE WITHOUT CARDIOVERSION N/A 02/24/2017   Procedure: TRANSESOPHAGEAL ECHOCARDIOGRAM (TEE);  Surgeon: Tonny Bollman, MD;  Location: [facility] OR;  Service: Open Heart Surgery;  Laterality: N/A;   TRANSCATHETER AORTIC VALVE REPLACEMENT, TRANSFEMORAL N/A 02/24/2017   Procedure: TRANSCATHETER AORTIC VALVE REPLACEMENT, TRANSFEMORAL using a 26mm Edwards Sapien 3 Aortic Valve;  Surgeon: Tonny Bollman, MD;  Location: [facility] OR;  Service: Open Heart Surgery;  Laterality: N/A;   TRANSURETHRAL RESECTION OF BLADDER TUMOR N/A 06/06/2016   Procedure: TRANSURETHRAL RESECTION OF BLADDER TUMOR (TURBT);  Surgeon: Marcine Matar, MD;  Location: [facility];  Service: Urology;  Laterality: N/A;   TRANSURETHRAL RESECTION OF BLADDER TUMOR N/A 07/22/2016   Procedure: TRANSURETHRAL RESECTION OF BLADDER TUMOR (TURBT);  Surgeon: Marcine Matar, MD;  Location: [facility];  Service: Urology;  Laterality: N/A;   TRANSURETHRAL RESECTION OF BLADDER TUMOR N/A 03/20/2020   Procedure: TRANSURETHRAL RESECTION OF BLADDER TUMOR (TURBT);  Surgeon: Marcine Matar, MD;  Location: [facility];  Service: Urology;  Laterality: N/A;   VIDEO ASSISTED THORACOSCOPY (VATS)/WEDGE RESECTION Right 03/30/2017   Procedure: VIDEO ASSISTED THORACOSCOPY (VATS)/WEDGE RESECTION;  Surgeon: Loreli Slot, MD;  Location: [facility] OR;  Service: Thoracic;  Laterality: Right;    Home Medications:  Allergies as of 08/25/2023       Reactions   Lovenox [enoxaparin]    HIT panel ordered 10/10        Medication List        Accurate as of August 17, 2023  3:45 PM. If you have any questions, ask your nurse or doctor.          albuterol 108 (90 Base) MCG/ACT inhaler Commonly known as: VENTOLIN HFA Inhale into the lungs.   alfuzosin 10 MG 24 hr tablet Commonly known as: UROXATRAL Take 1 tablet (10 mg total) by mouth at bedtime.   amLODipine 10 MG tablet Commonly known as: NORVASC Take 1 tablet (10 mg total) by mouth daily.    apixaban 5 MG Tabs tablet Commonly known as: Eliquis Take 1 tablet (5 mg total) by mouth 2 (two) times daily.   atorvastatin 40 MG tablet Commonly known as: LIPITOR Take 1 tablet (40 mg total) by mouth daily.   cholecalciferol 1000 units tablet Commonly known as: VITAMIN D Take 1 tablet (1,000 Units total) by mouth daily.   ferrous gluconate 324 MG tablet Commonly known as: FERGON TAKE ONE TABLET TWICE DAILY WITH meals   Fish Oil 1200 MG Caps Take 2,400 mg by mouth daily.   levothyroxine 88 MCG tablet Commonly known as: SYNTHROID Take 1 tablet (88 mcg total) by mouth daily.   lisinopril 40 MG tablet Commonly known as: ZESTRIL Take 1 tablet (40 mg total) by mouth daily.   metoprolol succinate 25 MG 24 hr tablet Commonly known as: TOPROL-XL Take 0.5 tablets (12.5 mg total) by mouth daily.   multivitamin with minerals Tabs tablet Take 1 tablet by mouth daily. One A Day Multivitamin for Men   pantoprazole 40 MG tablet Commonly known as: PROTONIX TAKE ONE TABLET TWICE DAILY        Allergies:  Allergies  Allergen Reactions   Lovenox [Enoxaparin]     HIT panel ordered 10/10    Family History  Problem Relation Age of Onset   Heart disease Father  Heart Disease before age 65   Alcohol abuse Father    Heart attack Father    Cancer Mother        Lung   COPD Mother    COPD Sister    Lupus Sister    Hypertension Brother    Gout Brother    Heart attack Son    Gout Brother    Prostatitis Brother     Social History:  reports that he quit smoking about 17 years ago. His smoking use included cigarettes. He started smoking about 68 years ago. He has a 51 pack-year smoking history. He has never used smokeless tobacco. He reports that he does not drink alcohol and does not use drugs.  ROS: A complete review of systems was performed.  All systems are negative except for pertinent findings as noted.  Physical Exam:  Vital signs in last 24 hours: There were  no vitals taken for this visit. Constitutional:  Alert and oriented, No acute distress Cardiovascular: Regular rate  Respiratory: Normal respiratory effort GI: Abdomen is soft, nontender, nondistended, no abdominal masses. No CVAT.  Genitourinary: Normal male phallus, testes are descended bilaterally and non-tender and without masses, scrotum is normal in appearance without lesions or masses, perineum is normal on inspection. Lymphatic: No lymphadenopathy Neurologic: Grossly intact, no focal deficits Psychiatric: Normal mood and affect  Cystoscopy Procedure Note:  Indication: Bladder cancer surveillance  After informed consent and discussion of the procedure and its risks, SUHAN PACI was positioned and prepped in the standard fashion.  Cystoscopy was performed with a flexible cystoscope.   Findings: Urethra: No strictures, no lesions noted. Prostate: Bladder neck: No contracture Ureteral orifices: Normal bilaterally Bladder: Old resection site seen with scar.  No urothelial lesions or foreign bodies  The patient tolerated the procedure well.  I have reviewed prior pt notes  I have reviewed urinalysis results  I have independently reviewed prior pathology  I have reviewed prior urine cultures, cytology   Impression/Assessment:  History of urothelial carcinoma the bladder, high-grade, nonmuscle invasive, years out from BCG therapy with normal cystoscopy today  Plan:  Voided urine will be sent for cytology  I will see back in 1 year for repeat cystoscopy

## 2023-08-24 ENCOUNTER — Other Ambulatory Visit: Payer: Self-pay | Admitting: Nurse Practitioner

## 2023-08-24 DIAGNOSIS — I10 Essential (primary) hypertension: Secondary | ICD-10-CM

## 2023-08-25 ENCOUNTER — Encounter: Payer: Self-pay | Admitting: Urology

## 2023-08-25 ENCOUNTER — Ambulatory Visit: Payer: Medicare Other | Admitting: Urology

## 2023-08-25 VITALS — BP 133/62 | HR 75

## 2023-08-25 DIAGNOSIS — Z8551 Personal history of malignant neoplasm of bladder: Secondary | ICD-10-CM

## 2023-08-25 MED ORDER — CIPROFLOXACIN HCL 500 MG PO TABS
500.0000 mg | ORAL_TABLET | Freq: Once | ORAL | Status: AC
  Administered 2023-08-25: 500 mg via ORAL

## 2023-08-26 LAB — URINALYSIS, ROUTINE W REFLEX MICROSCOPIC
Bilirubin, UA: NEGATIVE
Glucose, UA: NEGATIVE
Nitrite, UA: NEGATIVE
RBC, UA: NEGATIVE
Specific Gravity, UA: >1.03 — ABNORMAL HIGH (ref 1.005–1.030)
Urobilinogen, Ur: 1 mg/dL (ref 0.2–1.0)
pH, UA: 5.5 (ref 5.0–7.5)

## 2023-08-26 LAB — MICROSCOPIC EXAMINATION
Bacteria, UA: NONE SEEN
RBC, Urine: NONE SEEN /HPF (ref 0–2)

## 2023-08-27 LAB — CYTOLOGY - NON PAP

## 2023-09-08 ENCOUNTER — Other Ambulatory Visit: Payer: Self-pay | Admitting: Nurse Practitioner

## 2023-09-08 DIAGNOSIS — I25118 Atherosclerotic heart disease of native coronary artery with other forms of angina pectoris: Secondary | ICD-10-CM

## 2023-09-08 DIAGNOSIS — E782 Mixed hyperlipidemia: Secondary | ICD-10-CM

## 2023-09-08 DIAGNOSIS — E034 Atrophy of thyroid (acquired): Secondary | ICD-10-CM

## 2023-09-08 DIAGNOSIS — N400 Enlarged prostate without lower urinary tract symptoms: Secondary | ICD-10-CM

## 2023-09-08 DIAGNOSIS — K219 Gastro-esophageal reflux disease without esophagitis: Secondary | ICD-10-CM

## 2023-09-29 ENCOUNTER — Other Ambulatory Visit: Payer: Self-pay | Admitting: Nurse Practitioner

## 2023-09-29 DIAGNOSIS — I4821 Permanent atrial fibrillation: Secondary | ICD-10-CM

## 2023-10-22 ENCOUNTER — Ambulatory Visit

## 2023-10-23 ENCOUNTER — Ambulatory Visit (INDEPENDENT_AMBULATORY_CARE_PROVIDER_SITE_OTHER): Admitting: Nurse Practitioner

## 2023-10-23 ENCOUNTER — Encounter: Payer: Self-pay | Admitting: Nurse Practitioner

## 2023-10-23 VITALS — BP 136/66 | HR 56 | Temp 97.9°F | Ht 70.0 in | Wt 169.0 lb

## 2023-10-23 DIAGNOSIS — R600 Localized edema: Secondary | ICD-10-CM

## 2023-10-23 MED ORDER — FUROSEMIDE 20 MG PO TABS: 20.0000 mg | ORAL_TABLET | Freq: Every day | ORAL | 3 refills | Status: DC

## 2023-10-23 NOTE — Patient Instructions (Signed)
 Peripheral Edema  Peripheral edema is swelling that is caused by a buildup of fluid. Peripheral edema most often affects the lower legs, ankles, and feet. It can also develop in the arms, hands, and face. The area of the body that has peripheral edema will look swollen. It may also feel heavy or warm. Your clothes may start to feel tight. Pressing on the area may make a temporary dent in your skin (pitting edema). You may not be able to move your swollen arm or leg as much as usual. There are many causes of peripheral edema. It can happen because of a complication of other conditions such as heart failure, kidney disease, or a problem with your circulation. It also can be a side effect of certain medicines or happen because of an infection. It often happens to women during pregnancy. Sometimes, the cause is not known. Follow these instructions at home: Managing pain, stiffness, and swelling  Raise (elevate) your legs while you are sitting or lying down. Move around often to prevent stiffness and to reduce swelling. Do not sit or stand for long periods of time. Do not wear tight clothing. Do not wear garters on your upper legs. Exercise your legs to get your circulation going. This helps to move the fluid back into your blood vessels, and it may help the swelling go down. Wear compression stockings as told by your health care provider. These stockings help to prevent blood clots and reduce swelling in your legs. It is important that these are the correct size. These stockings should be prescribed by your doctor to prevent possible injuries. If elastic bandages or wraps are recommended, use them as told by your health care provider. Medicines Take over-the-counter and prescription medicines only as told by your health care provider. Your health care provider may prescribe medicine to help your body get rid of excess water (diuretic). Take this medicine if you are told to take it. General  instructions Eat a low-salt (low-sodium) diet as told by your health care provider. Sometimes, eating less salt may reduce swelling. Pay attention to any changes in your symptoms. Moisturize your skin daily to help prevent skin from cracking and draining. Keep all follow-up visits. This is important. Contact a health care provider if: You have a fever. You have swelling in only one leg. You have increased swelling, redness, or pain in one or both of your legs. You have drainage or sores at the area where you have edema. Get help right away if: You have edema that starts suddenly or is getting worse, especially if you are pregnant or have a medical condition. You develop shortness of breath, especially when you are lying down. You have pain in your chest or abdomen. You feel weak. You feel like you will faint. These symptoms may be an emergency. Get help right away. Call 911. Do not wait to see if the symptoms will go away. Do not drive yourself to the hospital. Summary Peripheral edema is swelling that is caused by a buildup of fluid. Peripheral edema most often affects the lower legs, ankles, and feet. Move around often to prevent stiffness and to reduce swelling. Do not sit or stand for long periods of time. Pay attention to any changes in your symptoms. Contact a health care provider if you have edema that starts suddenly or is getting worse, especially if you are pregnant or have a medical condition. Get help right away if you develop shortness of breath, especially when lying down.  This information is not intended to replace advice given to you by your health care provider. Make sure you discuss any questions you have with your health care provider. Document Revised: 02/11/2021 Document Reviewed: 02/11/2021 Elsevier Patient Education  2024 [employer].

## 2023-10-23 NOTE — Progress Notes (Signed)
 Subjective:    Patient ID: [name] [name], male    DOB: 17-Dec-1940, 83 y.o.   MRN: [id number]   Chief Complaint: bilateral feet and legs swelling (For 1 month)   HPI  Patient comes in today c/o bilateral feet and legs are swelling. Started about 1 month ago. He is currently not on a fluid pill. Goes down at night. Had home health from his insurance came to house yesterday and said he need to be seen.  Patient Active Problem List   Diagnosis Date Noted   PVD (peripheral vascular disease) (HCC) 10/30/2020   Coronary artery disease of native artery of native heart with stable angina pectoris (HCC) 10/30/2020   S/P TAVR (transcatheter aortic valve replacement) 10/30/2020   Gastroesophageal reflux disease without esophagitis 05/25/2019   Primary lung cancer, right (HCC) 05/24/2018   S/P lobectomy of lung 03/30/2017   Melena    Atrial fibrillation (HCC) 02/20/2017   BPH (benign prostatic hyperplasia) 02/20/2017   COPD (chronic obstructive pulmonary disease) (HCC) 12/09/2016   Aortic stenosis 12/11/2014   Vitamin D  deficiency    BMI 26.0-26.9,adult 05/18/2013   Fatty liver 02/24/2013   Gallbladder polyp 02/24/2013   HLD (hyperlipidemia) 01/14/2013   HTN (hypertension) 01/14/2013   Hypothyroidism 01/14/2013   S/P AAA repair 05/31/2012   Carotid stenosis 05/31/2012       Review of Systems  Cardiovascular:  Positive for leg swelling.       Objective:   Physical Exam Constitutional:      Appearance: Normal appearance.  Cardiovascular:     Rate and Rhythm: Normal rate and regular rhythm.     Heart sounds: Normal heart sounds.  Pulmonary:     Effort: Pulmonary effort is normal.     Breath sounds: Normal breath sounds.  Musculoskeletal:     Right lower leg: Edema (1+) present.     Left lower leg: Edema (1+) present.  Skin:    General: Skin is warm.  Neurological:     General: No focal deficit present.     Mental Status: He is alert and oriented to person, place,  and time.  Psychiatric:        Mood and Affect: Mood normal.        Behavior: Behavior normal.    BP 136/66   Pulse (!) 56   Temp 97.9 F (36.6 C) (Temporal)   Ht 5\' 10"  (1.778 m)   Wt 169 lb (76.7 kg)   SpO2 97%   BMI 24.25 kg/m         Assessment & Plan:   [name] [name] in today with chief complaint of bilateral feet and legs swelling (For 1 month)   1. Peripheral edema (Primary) Elevate legs when sitting Take lasix  for the next 2 days- then: weigh every morning and if have 2 lb weight gain in 24 hours then take a fluid pill that day  Orders Placed This Encounter  Procedures   Brain natriuretic peptide   CMP14+EGFR    Meds ordered this encounter  Medications   furosemide  (LASIX ) 20 MG tablet    Sig: Take 1 tablet (20 mg total) by mouth daily.    Dispense:  30 tablet    Refill:  3    Supervising Provider:   Jolyne Needs A [[id number]]    The above assessment and management plan was discussed with the patient. The patient verbalized understanding of and has agreed to the management plan. Patient is aware to call the clinic if  symptoms persist or worsen. Patient is aware when to return to the clinic for a follow-up visit. Patient educated on when it is appropriate to go to the emergency department.   [name] Gaylyn Keas, FNP

## 2023-10-24 LAB — CMP14+EGFR
ALT: 16 IU/L (ref 0–44)
AST: 32 IU/L (ref 0–40)
Albumin: 4.2 g/dL (ref 3.7–4.7)
Alkaline Phosphatase: 81 IU/L (ref 44–121)
BUN/Creatinine Ratio: 13 (ref 10–24)
BUN: 15 mg/dL (ref 8–27)
Bilirubin Total: 0.6 mg/dL (ref 0.0–1.2)
CO2: 23 mmol/L (ref 20–29)
Calcium: 9.1 mg/dL (ref 8.6–10.2)
Chloride: 104 mmol/L (ref 96–106)
Creatinine, Ser: 1.17 mg/dL (ref 0.76–1.27)
Globulin, Total: 3.3 g/dL (ref 1.5–4.5)
Glucose: 93 mg/dL (ref 70–99)
Potassium: 4 mmol/L (ref 3.5–5.2)
Sodium: 140 mmol/L (ref 134–144)
Total Protein: 7.5 g/dL (ref 6.0–8.5)
eGFR: 62 mL/min/{1.73_m2} (ref >59)

## 2023-10-24 LAB — BRAIN NATRIURETIC PEPTIDE: BNP: 324.8 pg/mL — ABNORMAL HIGH (ref 0.0–100.0)

## 2023-11-26 ENCOUNTER — Ambulatory Visit (INDEPENDENT_AMBULATORY_CARE_PROVIDER_SITE_OTHER): Admitting: Family Medicine

## 2023-11-26 ENCOUNTER — Encounter: Payer: Self-pay | Admitting: Family Medicine

## 2023-11-26 VITALS — BP 145/78 | HR 61 | Temp 98.0°F | Ht 70.0 in | Wt 167.0 lb

## 2023-11-26 DIAGNOSIS — J40 Bronchitis, not specified as acute or chronic: Secondary | ICD-10-CM

## 2023-11-26 DIAGNOSIS — J329 Chronic sinusitis, unspecified: Secondary | ICD-10-CM

## 2023-11-26 DIAGNOSIS — R6889 Other general symptoms and signs: Secondary | ICD-10-CM

## 2023-11-26 MED ORDER — AMOXICILLIN-POT CLAVULANATE 875-125 MG PO TABS: 1.0000 | ORAL_TABLET | Freq: Two times a day (BID) | ORAL | 0 refills | Status: DC

## 2023-11-26 MED ORDER — PROMETHAZINE-DM 6.25-15 MG/5ML PO SYRP: 5.0000 mL | ORAL_SOLUTION | Freq: Four times a day (QID) | ORAL | 0 refills | Status: DC | PRN

## 2023-11-26 NOTE — Progress Notes (Signed)
 Subjective:  Patient ID: [name] [name], male    DOB: 1941-03-22  Age: 83 y.o. MRN: [id number]  CC: sick (Congestion started a few days ago but now it is in his chest. Cough, congestion and SOB when active. Clear mucous along with sinus pressure. On going seasonal allergies have been a issue. Not treating with anything. )   HPI [name] [name] presents for Patient presents with upper respiratory congestion. . There is moderate sore throat. Patient reports profuse coughing frequently as well.  Thick white sputum noted. There is no fever, chills or sweats. The patient reports being short of breath with exertion. Onset was yesterday.Getting worse.      11/26/2023   12:51 PM 10/23/2023    2:00 PM 06/12/2023    8:19 AM  Depression screen PHQ 2/9  Decreased Interest 0 1 0  Down, Depressed, Hopeless 0 1 0  PHQ - 2 Score 0 2 0  Altered sleeping 0 1   Tired, decreased energy 0 1   Change in appetite 0 0   Feeling bad or failure about yourself  0 0   Trouble concentrating 0 0   Moving slowly or fidgety/restless 0 0   Suicidal thoughts 0 0   PHQ-9 Score 0 4   Difficult doing work/chores Not difficult at all Somewhat difficult     History Jeromiah has a past medical history of AAA (abdominal aortic aneurysm) (HCC), Aortic stenosis, severe, Arthritis, Atherosclerotic peripheral vascular disease (HCC), Atrial fibrillation, persistent (HCC), Bilateral carotid artery stenosis, Bladder cancer (HCC), COPD (chronic obstructive pulmonary disease) (HCC), Gastroesophageal reflux disease without esophagitis (05/25/2019), Hyperlipidemia, Hypertension, Hypothyroidism, Lung cancer (HCC), Lung mass, Skin cancer, and Vitamin D  deficiency.   He has a past surgical history that includes Hydrocele excision / repair; Abdominal aortic aneurysm repair (08/09/2009); Spine surgery; Skin cancer excision; Transurethral resection of bladder tumor (N/A, 06/06/2016); Transurethral resection of bladder tumor (N/A,  07/22/2016); Esophagogastroduodenoscopy (N/A, 02/21/2017); Transcatheter aortic valve replacement, transfemoral (N/A, 02/24/2017); TEE without cardioversion (N/A, 02/24/2017); Video assisted thoracoscopy (vats)/wedge resection (Right, 03/30/2017); Lobectomy (Right, 03/30/2017); Cystoscopy with biopsy (N/A, 09/02/2018); Cystoscopy w/ retrogrades (Bilateral, 09/02/2018); Back surgery; Cystoscopy w/ retrogrades (Bilateral, 03/20/2020); Transurethral resection of bladder tumor (N/A, 03/20/2020); and Cataract extraction bilateral w/ anterior vitrectomy (Bilateral, 2022).   His family history includes Alcohol abuse in his father; COPD in his mother and sister; Cancer in his mother; Gout in his brother and brother; Heart attack in his father and son; Heart disease in his father; Hypertension in his brother; Lupus in his sister; Prostatitis in his brother.He reports that he quit smoking about 18 years ago. His smoking use included cigarettes. He started smoking about 69 years ago. He has a 51 pack-year smoking history. He has never used smokeless tobacco. He reports that he does not drink alcohol and does not use drugs.    ROS Review of Systems  Constitutional:  Negative for activity change, appetite change, chills and fever.  HENT:  Positive for congestion, postnasal drip, rhinorrhea and sinus pressure. Negative for ear discharge, ear pain, hearing loss, nosebleeds, sneezing and trouble swallowing.   Respiratory:  Positive for cough and chest tightness. Negative for shortness of breath.   Cardiovascular:  Negative for chest pain and palpitations.  Skin:  Negative for rash.    Objective:  BP (!) 145/78   Pulse 61   Temp 98 F (36.7 C)   Ht 5\' 10"  (1.778 m)   Wt 167 lb (75.8 kg)   SpO2 96%  BMI 23.96 kg/m   BP Readings from Last 3 Encounters:  11/26/23 (!) 145/78  10/23/23 136/66  08/25/23 133/62    Wt Readings from Last 3 Encounters:  11/26/23 167 lb (75.8 kg)  10/23/23 169 lb (76.7 kg)   06/12/23 163 lb (73.9 kg)     Physical Exam Constitutional:      Appearance: He is well-developed.  HENT:     Head: Normocephalic and atraumatic.     Right Ear: Tympanic membrane and external ear normal. No decreased hearing noted.     Left Ear: Tympanic membrane and external ear normal. No decreased hearing noted.     Nose: Mucosal edema present.     Right Sinus: No frontal sinus tenderness.     Left Sinus: No frontal sinus tenderness.     Mouth/Throat:     Pharynx: No oropharyngeal exudate or posterior oropharyngeal erythema.  Neck:     Meningeal: Brudzinski's sign absent.  Pulmonary:     Effort: No respiratory distress.     Breath sounds: Normal breath sounds.  Lymphadenopathy:     Head:     Right side of head: No preauricular adenopathy.     Left side of head: No preauricular adenopathy.     Cervical:     Right cervical: No superficial cervical adenopathy.    Left cervical: No superficial cervical adenopathy.      Assessment & Plan:  Sinobronchitis  Flu-like symptoms -     COVID-19, Flu A+B and RSV  Other orders -     Amoxicillin -Pot Clavulanate; Take 1 tablet by mouth 2 (two) times daily. Take all of this medication  Dispense: 20 tablet; Refill: 0 -     Promethazine -DM; Take 5 mLs by mouth 4 (four) times daily as needed for cough.  Dispense: 240 mL; Refill: 0     Follow-up: Return if symptoms worsen or fail to improve.  Roise Cleaver, M.D.

## 2023-11-27 LAB — COVID-19, FLU A+B AND RSV
Influenza A, NAA: NOT DETECTED
Influenza B, NAA: NOT DETECTED
RSV, NAA: NOT DETECTED
SARS-CoV-2, NAA: NOT DETECTED

## 2023-11-28 ENCOUNTER — Ambulatory Visit: Payer: Self-pay | Admitting: Family Medicine

## 2023-12-06 NOTE — Progress Notes (Signed)
  Cardiology Office Note:   Date:  12/09/2023  ID:  [name] [name], DOB 02/02/1941, MRN [id number] PCP: Gladis Mustard, FNP  [facility] Providers Cardiologist:  Lynwood Schilling, MD {  History of Present Illness:   [name] [name] is a 83 y.o. male  with a history of TAVR, CAD, permanent atrial fibrillation, HLD, PVD, history of aneurysm repair of abdominal aorta using endovascular graft.  History of lung CA right lung.  History of recurrent bladder cancer with TURBT, history of smoking, COPD, HTN.   Since I last saw him he has had no new cardiovascular problems. The patient denies any new symptoms such as chest discomfort, neck or arm discomfort. There has been no new shortness of breath, PND or orthopnea. There have been no reported palpitations, presyncope or syncope.      .  ROS:   He has had sinus problems no problems with pollen.  Otherwise  as stated in the HPI and negative for all other systems.  ROS: As stated in the HPI and negative for all other systems.  Studies Reviewed:    EKG:   EKG Interpretation Date/Time:  Wednesday December 09 2023 10:31:43 EDT Ventricular Rate:  62 PR Interval:    QRS Duration:  74 QT Interval:  440 QTC Calculation: 447 R Axis:   49  Text Interpretation: Atrial fibrillation Low voltage QRS Nonspecific ST and T wave abnormality When compared with ECG of 16-Apr-2023 11:00, Nonspecific T wave abnormality now evident in Inferior leads Confirmed by Schilling Lynwood ([id number]) on 12/09/2023 10:56:22 AM    Risk Assessment/Calculations:              Physical Exam:   VS:  BP 120/62   Pulse 62   Ht 5' 9.5 (1.765 m)   Wt 164 lb (74.4 kg)   BMI 23.87 kg/m    Wt Readings from Last 3 Encounters:  12/09/23 164 lb (74.4 kg)  11/26/23 167 lb (75.8 kg)  10/23/23 169 lb (76.7 kg)     GEN: Well nourished, well developed in no acute distress NECK: No JVD; positive bilateral carotid carotid bruits CARDIAC: RRR, soft brief apical systolic  murmur nonradiating, no diastolic murmurs, rubs, gallops RESPIRATORY:  Clear to auscultation without rales, wheezing or rhonchi  ABDOMEN: Soft, non-tender, non-distended EXTREMITIES:  No edema; No deformity   ASSESSMENT AND PLAN:   TAVR   he is up-to-date with follow-up.  He understands endocarditis prophylaxis.   He had normal function in 2023 and I will likely repeat this next year after I see him.    Permanent atrial fibrillation [facility]): He tolerates anticoagulation.  He had no symptoms.  No change in therapy.  CAD in native artery:   He has had some nonobstructive disease in the past.  I reviewed the 2018 cath for this visit.  He has no chest pain.  He will continue with risk reduction.   Mixed hyperlipidemia:   LDL was 63.  No change in therapy.  PVD (peripheral vascular disease) [facility]): He is status post EVAR.  I reviewed [facility] notes from April and October of last year and I messaged their office to make sure he has follow-up.      Follow up with me in 1 year.  Signed, Lynwood Schilling, MD

## 2023-12-09 ENCOUNTER — Encounter: Payer: Self-pay | Admitting: Cardiology

## 2023-12-09 ENCOUNTER — Ambulatory Visit: Payer: Medicare Other | Admitting: Cardiology

## 2023-12-09 VITALS — BP 120/62 | HR 62 | Ht 69.5 in | Wt 164.0 lb

## 2023-12-09 DIAGNOSIS — I480 Paroxysmal atrial fibrillation: Secondary | ICD-10-CM | POA: Diagnosis not present

## 2023-12-09 DIAGNOSIS — E785 Hyperlipidemia, unspecified: Secondary | ICD-10-CM | POA: Diagnosis not present

## 2023-12-09 DIAGNOSIS — Z952 Presence of prosthetic heart valve: Secondary | ICD-10-CM | POA: Diagnosis not present

## 2023-12-09 DIAGNOSIS — I739 Peripheral vascular disease, unspecified: Secondary | ICD-10-CM

## 2023-12-09 DIAGNOSIS — I2510 Atherosclerotic heart disease of native coronary artery without angina pectoris: Secondary | ICD-10-CM | POA: Diagnosis not present

## 2023-12-09 NOTE — Patient Instructions (Signed)
 Medication Instructions:  Your physician recommends that you continue on your current medications as directed. Please refer to the Current Medication list given to you today.  *If you need a refill on your cardiac medications before your next appointment, please call your pharmacy*  Lab Work: NONE   If you have labs (blood work) drawn today and your tests are completely normal, you will receive your results only by: MyChart Message (if you have MyChart) OR A paper copy in the mail If you have any lab test that is abnormal or we need to change your treatment, we will call you to review the results.  Testing/Procedures: NONE   Follow-Up: At [facility], you and your health needs are our priority.  As part of our continuing mission to provide you with exceptional heart care, our providers are all part of one team.  This team includes your primary Cardiologist (physician) and Advanced Practice Providers or APPs (Physician Assistants and Nurse Practitioners) who all work together to provide you with the care you need, when you need it.  Your next appointment:   1 year(s)  Provider:   Eilleen Grates, MD    We recommend signing up for the patient portal called "MyChart".  Sign up information is provided on this After Visit Summary.  MyChart is used to connect with patients for Virtual Visits (Telemedicine).  Patients are able to view lab/test results, encounter notes, upcoming appointments, etc.  Non-urgent messages can be sent to your provider as well.   To learn more about what you can do with MyChart, go to [email or web address].   Other Instructions Thank you for choosing [facility]!

## 2023-12-11 ENCOUNTER — Ambulatory Visit: Payer: Medicare Other | Admitting: Nurse Practitioner

## 2023-12-11 ENCOUNTER — Encounter: Payer: Self-pay | Admitting: Nurse Practitioner

## 2023-12-11 VITALS — BP 140/59 | HR 63 | Temp 98.3°F | Ht 69.0 in | Wt 165.0 lb

## 2023-12-11 DIAGNOSIS — Z0001 Encounter for general adult medical examination with abnormal findings: Secondary | ICD-10-CM | POA: Diagnosis not present

## 2023-12-11 DIAGNOSIS — K219 Gastro-esophageal reflux disease without esophagitis: Secondary | ICD-10-CM | POA: Diagnosis not present

## 2023-12-11 DIAGNOSIS — J449 Chronic obstructive pulmonary disease, unspecified: Secondary | ICD-10-CM | POA: Diagnosis not present

## 2023-12-11 DIAGNOSIS — I739 Peripheral vascular disease, unspecified: Secondary | ICD-10-CM | POA: Diagnosis not present

## 2023-12-11 DIAGNOSIS — I350 Nonrheumatic aortic (valve) stenosis: Secondary | ICD-10-CM

## 2023-12-11 DIAGNOSIS — E782 Mixed hyperlipidemia: Secondary | ICD-10-CM | POA: Diagnosis not present

## 2023-12-11 DIAGNOSIS — I480 Paroxysmal atrial fibrillation: Secondary | ICD-10-CM

## 2023-12-11 DIAGNOSIS — I6523 Occlusion and stenosis of bilateral carotid arteries: Secondary | ICD-10-CM

## 2023-12-11 DIAGNOSIS — E034 Atrophy of thyroid (acquired): Secondary | ICD-10-CM | POA: Diagnosis not present

## 2023-12-11 DIAGNOSIS — I10 Essential (primary) hypertension: Secondary | ICD-10-CM | POA: Diagnosis not present

## 2023-12-11 DIAGNOSIS — Z0000 Encounter for general adult medical examination without abnormal findings: Secondary | ICD-10-CM

## 2023-12-11 DIAGNOSIS — E559 Vitamin D deficiency, unspecified: Secondary | ICD-10-CM

## 2023-12-11 DIAGNOSIS — R0981 Nasal congestion: Secondary | ICD-10-CM

## 2023-12-11 DIAGNOSIS — I25118 Atherosclerotic heart disease of native coronary artery with other forms of angina pectoris: Secondary | ICD-10-CM | POA: Diagnosis not present

## 2023-12-11 DIAGNOSIS — N400 Enlarged prostate without lower urinary tract symptoms: Secondary | ICD-10-CM

## 2023-12-11 DIAGNOSIS — Z6826 Body mass index (BMI) 26.0-26.9, adult: Secondary | ICD-10-CM

## 2023-12-11 LAB — LIPID PANEL

## 2023-12-11 MED ORDER — FLUTICASONE PROPIONATE 50 MCG/ACT NA SUSP: 2.0000 | Freq: Every day | NASAL | 6 refills | Status: AC

## 2023-12-11 NOTE — Progress Notes (Signed)
 Subjective:    Patient ID: [name] [name], male    DOB: 1940/08/28, 83 y.o.   MRN: [id number]   Chief Complaint: annual physical    HPI:  [name] [name] is a 83 y.o. who identifies as a male who was assigned male at birth.   Social history: Lives with: wife Work history: retired from [employer] in today for follow up of the following chronic medical issues:  1. Primary hypertension No c/o chest pain, sob or headache. Doe snot check blood pressure at home. BP Readings from Last 3 Encounters:  12/09/23 120/62  11/26/23 (!) 145/78  10/23/23 136/66    2. Nonrheumatic aortic valve stenosis 3. Bilateral carotid artery stenosis 4. PVD (peripheral vascular disease) (HCC) 5. Permanent atrial fibrillation (HCC) 6. Coronary artery disease of native artery of native heart with stable angina pectoris [facility]) Last saw cardiology on 12/09/23. Review of office note showed no change in plan of care. He is to follow up in one year.  7. Chronic obstructive pulmonary disease, unspecified COPD type (HCC) Says he is having no breathing issues. Is currently on no inhalers. Currently has a cough  8. Primary lung cancer, right (HCC) Was in 2019 and he had surgery and is doing well. No cancer reoccurence. Last saw oncology on 04/16/23. Will continue yearly follow up.  9. Gastroesophageal reflux disease without esophagitis Takes protonix  daily and is doing well.  10. Hypothyroidism due to acquired atrophy of thyroid  No issues that he is aware of. Lab Results  Component Value Date   TSH 3.550 12/11/2022     11. Mixed hyperlipidemia Does watch diet and stays very active Lab Results  Component Value Date   CHOL 115 06/12/2023   HDL 33 (L) 06/12/2023   LDLCALC 63 06/12/2023   TRIG 97 06/12/2023   CHOLHDL 3.5 06/12/2023     12. Benign prostatic hyperplasia without lower urinary tract symptoms Sees urology. Recently had procedure for bladder cancer. Denies any current voiding  issues  13. Vitamin D  deficiency Is on vitamin d  supplement  14. BMI 26.0-26.9,adult No recent weight changes  Wt Readings from Last 3 Encounters:  12/11/23 165 lb (74.8 kg)  12/09/23 164 lb (74.4 kg)  11/26/23 167 lb (75.8 kg)   BMI Readings from Last 3 Encounters:  12/11/23 24.37 kg/m  12/09/23 23.87 kg/m  11/26/23 23.96 kg/m     New complaints: Has been sick for 2 weeks.has had a cough and congestion . Was treated with augmentin  and promethazine  DM. He was getting better but lost electricity in his house the other night and it got really hot and woke next morning sick again. Says he still feels stopped up.   Allergies  Allergen Reactions   Lovenox  [Enoxaparin ]     HIT panel ordered 10/10   Outpatient Encounter Medications as of 12/11/2023  Medication Sig   albuterol  (VENTOLIN  HFA) 108 (90 Base) MCG/ACT inhaler Inhale into the lungs.   alfuzosin  (UROXATRAL ) 10 MG 24 hr tablet TAKE ONE TABLET AT BEDTIME   amLODipine  (NORVASC ) 10 MG tablet TAKE ONE TABLET DAILY   amoxicillin -clavulanate (AUGMENTIN ) 875-125 MG tablet Take 1 tablet by mouth 2 (two) times daily. Take all of this medication (Patient not taking: Reported on 12/09/2023)   apixaban  (ELIQUIS ) 5 MG TABS tablet TAKE ONE TABLET TWICE DAILY   atorvastatin  (LIPITOR) 40 MG tablet TAKE ONE TABLET DAILY   cholecalciferol  (VITAMIN D ) 1000 UNITS tablet Take 1 tablet (1,000 Units total) by mouth daily.  ferrous gluconate  (FERGON) 324 MG tablet TAKE ONE TABLET TWICE DAILY WITH MEAL(S)   furosemide  (LASIX ) 20 MG tablet Take 1 tablet (20 mg total) by mouth daily.   levothyroxine  (SYNTHROID ) 88 MCG tablet TAKE ONE TABLET DAILY   lisinopril  (ZESTRIL ) 40 MG tablet TAKE ONE TABLET DAILY   metoprolol  succinate (TOPROL -XL) 25 MG 24 hr tablet TAKE 1/2 TABLET DAILY   Multiple Vitamin (MULTIVITAMIN WITH MINERALS) TABS tablet Take 1 tablet by mouth daily. One A Day Multivitamin for Men   Omega-3 Fatty Acids (FISH OIL) 1200 MG CAPS Take  2,400 mg by mouth daily.    pantoprazole  (PROTONIX ) 40 MG tablet TAKE ONE TABLET TWICE DAILY   promethazine -dextromethorphan (PROMETHAZINE -DM) 6.25-15 MG/5ML syrup Take 5 mLs by mouth 4 (four) times daily as needed for cough.   No facility-administered encounter medications on file as of 12/11/2023.    Past Surgical History:  Procedure Laterality Date   ABDOMINAL AORTIC ANEURYSM REPAIR  08/09/2009   BACK SURGERY     x2   CATARACT EXTRACTION BILATERAL W/ ANTERIOR VITRECTOMY Bilateral 2022   CYSTOSCOPY W/ RETROGRADES Bilateral 09/02/2018   Procedure: CYSTOSCOPY WITH RETROGRADE PYELOGRAM;  Surgeon: Trent Frizzle, MD;  Location: [facility];  Service: Urology;  Laterality: Bilateral;   CYSTOSCOPY W/ RETROGRADES Bilateral 03/20/2020   Procedure: CYSTOSCOPY WITH BILATERAL RETROGRADE PYELOGRAM;  Surgeon: Trent Frizzle, MD;  Location: [facility];  Service: Urology;  Laterality: Bilateral;   CYSTOSCOPY WITH BIOPSY N/A 09/02/2018   Procedure: CYSTOSCOPY WITH BIOPSY;  Surgeon: Trent Frizzle, MD;  Location: [facility];  Service: Urology;  Laterality: N/A;   ESOPHAGOGASTRODUODENOSCOPY N/A 02/21/2017   Procedure: ESOPHAGOGASTRODUODENOSCOPY (EGD);  Surgeon: Nannette Babe, MD;  Location: [facility] END[facility]COPY;  Service: Gastroenterology;  Laterality: N/A;   HYDROCELE EXCISION / REPAIR     LOBECTOMY Right 03/30/2017   Procedure: RIGHT UPPER LOBECTOMY LUNG;  Surgeon: Zelphia Higashi, MD;  Location: [facility] OR;  Service: Thoracic;  Laterality: Right;   SKIN CANCER EXCISION     SPINE SURGERY     TEE WITHOUT CARDIOVERSION N/A 02/24/2017   Procedure: TRANSESOPHAGEAL ECHOCARDIOGRAM (TEE);  Surgeon: Arnoldo Lapping, MD;  Location: [facility] OR;  Service: Open Heart Surgery;  Laterality: N/A;   TRANSCATHETER AORTIC VALVE REPLACEMENT, TRANSFEMORAL N/A 02/24/2017   Procedure: TRANSCATHETER AORTIC VALVE REPLACEMENT, TRANSFEMORAL using a 26mm Edwards Sapien 3 Aortic Valve;  Surgeon: Arnoldo Lapping, MD;  Location: [facility] OR;  Service: Open Heart Surgery;  Laterality: N/A;   TRANSURETHRAL RESECTION OF BLADDER TUMOR N/A 06/06/2016   Procedure: TRANSURETHRAL RESECTION OF BLADDER TUMOR (TURBT);  Surgeon: Trent Frizzle, MD;  Location: [facility];  Service: Urology;  Laterality: N/A;   TRANSURETHRAL RESECTION OF BLADDER TUMOR N/A 07/22/2016   Procedure: TRANSURETHRAL RESECTION OF BLADDER TUMOR (TURBT);  Surgeon: Trent Frizzle, MD;  Location: [facility];  Service: Urology;  Laterality: N/A;   TRANSURETHRAL RESECTION OF BLADDER TUMOR N/A 03/20/2020   Procedure: TRANSURETHRAL RESECTION OF BLADDER TUMOR (TURBT);  Surgeon: Trent Frizzle, MD;  Location: [facility];  Service: Urology;  Laterality: N/A;   VIDEO ASSISTED THORACOSCOPY (VATS)/WEDGE RESECTION Right 03/30/2017   Procedure: VIDEO ASSISTED THORACOSCOPY (VATS)/WEDGE RESECTION;  Surgeon: Zelphia Higashi, MD;  Location: [facility] OR;  Service: Thoracic;  Laterality: Right;    Family History  Problem Relation Age of Onset   Heart disease Father        Heart Disease before age 59   Alcohol abuse Father    Heart attack Father    Cancer Mother  Lung   COPD Mother    COPD Sister    Lupus Sister    Hypertension Brother    Gout Brother    Heart attack Son    Gout Brother    Prostatitis Brother       Controlled substance contract: n/a     Review of Systems  Constitutional:  Negative for diaphoresis.  Eyes:  Negative for pain.  Respiratory:  Negative for shortness of breath.   Cardiovascular:  Negative for chest pain, palpitations and leg swelling.  Gastrointestinal:  Negative for abdominal pain.  Endocrine: Negative for polydipsia.  Skin:  Negative for rash.  Neurological:  Negative for dizziness, weakness and headaches.  Hematological:  Does not bruise/bleed easily.  All other systems reviewed and are negative.      Objective:   Physical Exam Vitals and nursing note reviewed.  Constitutional:      Appearance:  Normal appearance. He is well-developed.  HENT:     Head: Normocephalic.     Nose: Nose normal.     Mouth/Throat:     Mouth: Mucous membranes are moist.     Pharynx: Oropharynx is clear.   Eyes:     Pupils: Pupils are equal, round, and reactive to light.   Neck:     Thyroid : No thyroid  mass or thyromegaly.     Vascular: No carotid bruit or JVD.     Trachea: Phonation normal.   Cardiovascular:     Rate and Rhythm: Normal rate and regular rhythm.  Pulmonary:     Effort: Pulmonary effort is normal. No respiratory distress.     Breath sounds: Normal breath sounds.     Comments: Dry cough Abdominal:     General: Bowel sounds are normal.     Palpations: Abdomen is soft.     Tenderness: There is no abdominal tenderness.   Musculoskeletal:        General: Normal range of motion.     Cervical back: Normal range of motion and neck supple.  Lymphadenopathy:     Cervical: No cervical adenopathy.   Skin:    General: Skin is warm and dry.   Neurological:     Mental Status: He is alert and oriented to person, place, and time.   Psychiatric:        Behavior: Behavior normal.        Thought Content: Thought content normal.        Judgment: Judgment normal.     BP (!) 140/59   Pulse 63   Temp 98.3 F (36.8 C) (Temporal)   Ht 5' 9 (1.753 m)   Wt 165 lb (74.8 kg)   SpO2 96%   BMI 24.37 kg/m          Assessment & Plan:  [name] [name] comes in today with chief complaint of No chief complaint on file.   Diagnosis and orders addressed:  1. Primary hypertension Low sodium diet - CBC with Differential/Platelet - CMP14+EGFR - amLODipine  (NORVASC ) 10 MG tablet; Take 1 tablet (10 mg total) by mouth daily.  Dispense: 90 tablet; Refill: 1  2. Nonrheumatic aortic valve stenosis 3. Bilateral carotid artery stenosis 4. PVD (peripheral vascular disease) (HCC) 5. Permanent atrial fibrillation (HCC) - apixaban  (ELIQUIS ) 5 MG TABS tablet; Take 1 tablet (5 mg total) by  mouth 2 (two) times daily.  Dispense: 180 tablet; Refill: 1  6. Coronary artery disease of native artery of native heart with stable angina pectoris (HCC) Yearly follow up with cardiology -  metoprolol  succinate (TOPROL -XL) 25 MG 24 hr tablet; Take 0.5 tablets (12.5 mg total) by mouth daily.  Dispense: 45 tablet; Refill: 1  7. Chronic obstructive pulmonary disease, unspecified COPD type (HCC)  8. Primary lung cancer, right (HCC)  9. Gastroesophageal reflux disease without esophagitis Avoid spicy foods Do not eat 2 hours prior to bedtime - pantoprazole  (PROTONIX ) 40 MG tablet; TAKE ONE TABLET TWICE DAILY  Dispense: 180 tablet; Refill: 1  10. Hypothyroidism due to acquired atrophy of thyroid  Labs oending - Thyroid  Panel With TSH - levothyroxine  (SYNTHROID ) 88 MCG tablet; Take 1 tablet (88 mcg total) by mouth daily.  Dispense: 90 tablet; Refill: 1  11. Mixed hyperlipidemia Low fat diet - Lipid panel - atorvastatin  (LIPITOR) 40 MG tablet; Take 1 tablet (40 mg total) by mouth daily.  Dispense: 90 tablet; Refill: 1 - lisinopril  (ZESTRIL ) 40 MG tablet; Take 1 tablet (40 mg total) by mouth daily.  Dispense: 90 tablet; Refill: 1  12. Benign prostatic hyperplasia without lower urinary tract symptoms Report any voiding issues - alfuzosin  (UROXATRAL ) 10 MG 24 hr tablet; Take 1 tablet (10 mg total) by mouth at bedtime.  Dispense: 90 tablet; Refill: 1  13. Vitamin D  deficiency Continue vitamin d  supplement  14. BMI 26.0-26.9,adult Discussed diet and exercise for person with BMI >25 Will recheck weight in 3-6 months  15. Nasal congestion Flonase nasal spray- 2 sprays each nostril daily Force fluids Run humidifier  Labs pending Health Maintenance reviewed Diet and exercise encouraged  Follow up plan: 6 months   [name] Gaylyn Keas, FNP

## 2023-12-11 NOTE — Patient Instructions (Signed)
 How to Perform a Sinus Rinse A sinus rinse is a home treatment. It rinses your sinuses with a mixture of salt and water (saline solution). Sinuses are air-filled spaces in your skull behind the bones of your face and forehead. They open into your nasal cavity. A sinus rinse can help to clear your nasal cavity. It can clear mucus, dirt, dust, or pollen. You may do a sinus rinse when you have: A cold. A virus. Allergies. A sinus infection. A stuffy nose. What are the risks? A sinus rinse is normally very safe and helpful. However, there are a few risks. These include: A burning feeling in the sinuses. This may happen if you do not make the saline solution as told. Follow all directions. Nasal irritation. Infection from unclean water. This is rare, but it can happen. Do not do a sinus rinse if you have had: Ear or nasal surgery. An ear infection. Plugged ears. Supplies needed: Saline solution or powder. Distilled or germ-free (sterile) water may be needed to mix with saline powder. You may use boiled and cooled tap water. Boil tap water for 5 minutes. Cool the water until it is lukewarm. Use within 24 hours. Do not use regular tap water to mix with the saline solution. Neti pot or nasal rinse bottle. These release the saline solution into your nose and through your sinuses. You can buy neti pots and rinse bottles: At your local pharmacy. At a health food store. Online. How to do a sinus rinse  Wash your hands with soap and water for at least 20 seconds. If you cannot use soap and water, use hand sanitizer. Wash your device using the directions that came with it. Dry your device. Use the solution that comes with your device or one that is sold separately in stores. Follow the mixing directions on the package if you need to mix with germ-free or distilled water. Fill your device with the amount of saline solution stated in the device instructions. Stand by a sink and tilt your head  sideways over the sink. Place the spout of the device in your upper nostril (the one closer to the ceiling). Gently pour or squeeze the saline solution into your nasal cavity. The liquid should drain to your lower nostril if you are not too stuffed up (congested). While rinsing, breathe through your open mouth. Gently blow your nose to clear any mucus and rinse solution. Blowing too hard may cause ear pain. Turn your head in the other direction and repeat in your other nostril. Clean and rinse your device with clean water. Air-dry your device. Talk with your doctor or pharmacist if you have questions about how to do a sinus rinse. Summary A sinus rinse is a home treatment. It rinses your sinuses with a mixture of salt and water (saline solution). A sinus rinse can clear mucus, dirt, dust, or pollen. A sinus rinse is normally very safe and helpful. Follow all instructions carefully. This information is not intended to replace advice given to you by your health care provider. Make sure you discuss any questions you have with your health care provider. Document Revised: 11/26/2020 Document Reviewed: 11/26/2020 Elsevier Patient Education  2024 [employer].

## 2023-12-12 LAB — CMP14+EGFR
ALT: 16 IU/L (ref 0–44)
AST: 29 IU/L (ref 0–40)
Albumin: 4.2 g/dL (ref 3.7–4.7)
Alkaline Phosphatase: 88 IU/L (ref 44–121)
BUN/Creatinine Ratio: 16 (ref 10–24)
BUN: 17 mg/dL (ref 8–27)
Bilirubin Total: 0.6 mg/dL (ref 0.0–1.2)
CO2: 23 mmol/L (ref 20–29)
Calcium: 9.4 mg/dL (ref 8.6–10.2)
Chloride: 107 mmol/L — ABNORMAL HIGH (ref 96–106)
Creatinine, Ser: 1.06 mg/dL (ref 0.76–1.27)
Globulin, Total: 3.7 g/dL (ref 1.5–4.5)
Glucose: 103 mg/dL — ABNORMAL HIGH (ref 70–99)
Potassium: 3.9 mmol/L (ref 3.5–5.2)
Sodium: 147 mmol/L — ABNORMAL HIGH (ref 134–144)
Total Protein: 7.9 g/dL (ref 6.0–8.5)
eGFR: 69 mL/min/{1.73_m2} (ref >59)

## 2023-12-12 LAB — CBC WITH DIFFERENTIAL/PLATELET
Basophils Absolute: 0 10*3/uL (ref 0.0–0.2)
Basos: 1 %
EOS (ABSOLUTE): 0.1 10*3/uL (ref 0.0–0.4)
Eos: 2 %
Hematocrit: 35.8 % — ABNORMAL LOW (ref 37.5–51.0)
Hemoglobin: 11.8 g/dL — ABNORMAL LOW (ref 13.0–17.7)
Immature Grans (Abs): 0 10*3/uL (ref 0.0–0.1)
Immature Granulocytes: 0 %
Lymphocytes Absolute: 1.3 10*3/uL (ref 0.7–3.1)
Lymphs: 19 %
MCH: 31.8 pg (ref 26.6–33.0)
MCHC: 33 g/dL (ref 31.5–35.7)
MCV: 97 fL (ref 79–97)
Monocytes Absolute: 0.5 10*3/uL (ref 0.1–0.9)
Monocytes: 8 %
Neutrophils Absolute: 4.6 10*3/uL (ref 1.4–7.0)
Neutrophils: 70 %
Platelets: 146 10*3/uL — ABNORMAL LOW (ref 150–450)
RBC: 3.71 x10E6/uL — ABNORMAL LOW (ref 4.14–5.80)
RDW: 12.8 % (ref 11.6–15.4)
WBC: 6.6 10*3/uL (ref 3.4–10.8)

## 2023-12-12 LAB — LIPID PANEL
Chol/HDL Ratio: 3 ratio (ref 0.0–5.0)
Cholesterol, Total: 78 mg/dL — ABNORMAL LOW (ref 100–199)
HDL: 26 mg/dL — ABNORMAL LOW (ref >39)
LDL Chol Calc (NIH): 35 mg/dL (ref 0–99)
Triglycerides: 83 mg/dL (ref 0–149)
VLDL Cholesterol Cal: 17 mg/dL (ref 5–40)

## 2023-12-12 LAB — THYROID PANEL WITH TSH
Free Thyroxine Index: 1.4 (ref 1.2–4.9)
T3 Uptake Ratio: 27 % (ref 24–39)
T4, Total: 5.3 ug/dL (ref 4.5–12.0)
TSH: 1.44 u[IU]/mL (ref 0.450–4.500)

## 2023-12-15 ENCOUNTER — Ambulatory Visit: Payer: Self-pay | Admitting: Nurse Practitioner

## 2023-12-16 ENCOUNTER — Ambulatory Visit: Payer: Medicare Other

## 2023-12-16 VITALS — BP 140/59 | HR 63 | Ht 69.0 in | Wt 165.0 lb

## 2023-12-16 DIAGNOSIS — Z0000 Encounter for general adult medical examination without abnormal findings: Secondary | ICD-10-CM | POA: Diagnosis not present

## 2023-12-16 NOTE — Progress Notes (Signed)
 Subjective:   [name] [name] is a 83 y.o. who presents for a Medicare Wellness preventive visit.  As a reminder, Annual Wellness Visits don't include a physical exam, and some assessments may be limited, especially if this visit is performed virtually. We may recommend an in-person follow-up visit with your provider if needed.  Visit Complete: Virtual I connected with  [name] [name] on 12/16/23 by a audio enabled telemedicine application and verified that I am speaking with the correct person using two identifiers.  Patient Location: Home  Provider Location: Home Office  I discussed the limitations of evaluation and management by telemedicine. The patient expressed understanding and agreed to proceed.  Vital Signs: Because this visit was a virtual/telehealth visit, some criteria may be missing or patient reported. Any vitals not documented were not able to be obtained and vitals that have been documented are patient reported.  VideoDeclined- This patient declined Librarian, academic. Therefore the visit was completed with audio only.  Persons Participating in Visit: Patient.  AWV Questionnaire: No: Patient Medicare AWV questionnaire was not completed prior to this visit.  Cardiac Risk Factors include: advanced age (>15men, >40 women);dyslipidemia;hypertension;male gender     Objective:    Today's Vitals   12/16/23 1036  BP: (!) 140/59  Pulse: 63  Weight: 165 lb (74.8 kg)  Height: 5' 9 (1.753 m)   Body mass index is 24.37 kg/m.     12/16/2023   10:42 AM 12/15/2022   11:29 AM 12/09/2021   11:20 AM 04/18/2021    9:54 AM 12/06/2020    1:45 PM 03/21/2020   10:18 AM 03/21/2020    8:23 AM  Advanced Directives  Does Patient Have a Medical Advance Directive? Yes Yes Yes Yes Yes No Yes  Type of Estate agent of [employer] Power of [place];Living will Healthcare Power of [place];Living will Living will Healthcare  Power of [place];Living will  Healthcare Power of Attorney  Does patient want to make changes to medical advance directive?      No - Patient declined No - Patient declined  Copy of Healthcare Power of Attorney in Chart? No - copy requested No - copy requested No - copy requested  No - copy requested  No - copy requested    Current Medications (verified) Outpatient Encounter Medications as of 12/16/2023  Medication Sig   albuterol  (VENTOLIN  HFA) 108 (90 Base) MCG/ACT inhaler Inhale into the lungs.   alfuzosin  (UROXATRAL ) 10 MG 24 hr tablet TAKE ONE TABLET AT BEDTIME   amLODipine  (NORVASC ) 10 MG tablet TAKE ONE TABLET DAILY   apixaban  (ELIQUIS ) 5 MG TABS tablet TAKE ONE TABLET TWICE DAILY   atorvastatin  (LIPITOR) 40 MG tablet TAKE ONE TABLET DAILY   cholecalciferol  (VITAMIN D ) 1000 UNITS tablet Take 1 tablet (1,000 Units total) by mouth daily.   ferrous gluconate  (FERGON) 324 MG tablet TAKE ONE TABLET TWICE DAILY WITH MEAL(S)   fluticasone  (FLONASE ) 50 MCG/ACT nasal spray Place 2 sprays into both nostrils daily.   furosemide  (LASIX ) 20 MG tablet Take 1 tablet (20 mg total) by mouth daily.   levothyroxine  (SYNTHROID ) 88 MCG tablet TAKE ONE TABLET DAILY   lisinopril  (ZESTRIL ) 40 MG tablet TAKE ONE TABLET DAILY   metoprolol  succinate (TOPROL -XL) 25 MG 24 hr tablet TAKE 1/2 TABLET DAILY   Multiple Vitamin (MULTIVITAMIN WITH MINERALS) TABS tablet Take 1 tablet by mouth daily. One A Day Multivitamin for Men   Omega-3 Fatty Acids (FISH OIL) 1200 MG CAPS Take  2,400 mg by mouth daily.    pantoprazole  (PROTONIX ) 40 MG tablet TAKE ONE TABLET TWICE DAILY   promethazine -dextromethorphan (PROMETHAZINE -DM) 6.25-15 MG/5ML syrup Take 5 mLs by mouth 4 (four) times daily as needed for cough.   amoxicillin -clavulanate (AUGMENTIN ) 875-125 MG tablet Take 1 tablet by mouth 2 (two) times daily. Take all of this medication (Patient not taking: Reported on 12/16/2023)   No facility-administered encounter medications  on file as of 12/16/2023.    Allergies (verified) Lovenox  [enoxaparin ]   History: Past Medical History:  Diagnosis Date   AAA (abdominal aortic aneurysm) (HCC)    Aortic stenosis, severe    a. 02/2017: s/p TAVR with Edwards Sapien 3 THV (size 26 mm, model # 9600TFX, serial # [id number])   Arthritis    Atherosclerotic peripheral vascular disease (HCC)    Atrial fibrillation, persistent (HCC)    a. not a candidate for [facility] given hx of GI bleed   Bilateral carotid artery stenosis    Bladder cancer (HCC)    COPD (chronic obstructive pulmonary disease) (HCC)    Gastroesophageal reflux disease without esophagitis 05/25/2019   Hyperlipidemia    Hypertension    Hypothyroidism    Lung cancer (HCC)    Right   Lung mass    Right   Skin cancer    Vitamin D  deficiency    Past Surgical History:  Procedure Laterality Date   ABDOMINAL AORTIC ANEURYSM REPAIR  08/09/2009   BACK SURGERY     x2   CATARACT EXTRACTION BILATERAL W/ ANTERIOR VITRECTOMY Bilateral 2022   CYSTOSCOPY W/ RETROGRADES Bilateral 09/02/2018   Procedure: CYSTOSCOPY WITH RETROGRADE PYELOGRAM;  Surgeon: Matilda Senior, MD;  Location: [facility];  Service: Urology;  Laterality: Bilateral;   CYSTOSCOPY W/ RETROGRADES Bilateral 03/20/2020   Procedure: CYSTOSCOPY WITH BILATERAL RETROGRADE PYELOGRAM;  Surgeon: Matilda Senior, MD;  Location: [facility];  Service: Urology;  Laterality: Bilateral;   CYSTOSCOPY WITH BIOPSY N/A 09/02/2018   Procedure: CYSTOSCOPY WITH BIOPSY;  Surgeon: Matilda Senior, MD;  Location[facility];  Service: Urology;  Laterality: N/A;   ESOPHAGOGASTRODUODENOSCOPY N/A 02/21/2017   Procedure: ESOPHAGOGASTRODUODENOSCOPY (EGD);  Surgeon: Albertus Gordy HERO, MD;  Location: [facility] ENDOSCOPY;  Service: Gastroenterology;  Laterality: N/A;   HYDROCELE EXCISION / REPAIR     LOBECTOMY Right 03/30/2017   Procedure: RIGHT UPPER LOBECTOMY LUNG;  Surgeon: Kerrin Elspeth BROCKS, MD;  Location: [facility]  OR;  Service: Thoracic;  Laterality: Right;   SKIN CANCER EXCISION     SPINE SURGERY     TEE WITHOUT CARDIOVERSION N/A 02/24/2017   Procedure: TRANSESOPHAGEAL ECHOCARDIOGRAM (TEE);  Surgeon: Wonda Sharper, MD;  Location[facility] OR;  Service: Open Heart Surgery;  Laterality: N/A;   TRANSCATHETER AORTIC VALVE REPLACEMENT, TRANSFEMORAL N/A 02/24/2017   Procedure: TRANSCATHETER AORTIC VALVE REPLACEMENT, TRANSFEMORAL using a 26mm Edwards Sapien 3 Aortic Valve;  Surgeon: Wonda Sharper, MD;  Location: [facility] OR;  Service: Open Heart Surgery;  Laterality: N/A;   TRANSURETHRAL RESECTION OF BLADDER TUMOR N/A 06/06/2016   Procedure: TRANSURETHRAL RESECTION OF BLADDER TUMOR (TURBT);  Surgeon: Senior Matilda, MD;  Location: [facility];  Service: Urology;  Laterality: N/A;   TRANSURETHRAL RESECTION OF BLADDER TUMOR N/A 07/22/2016   Procedure: TRANSURETHRAL RESECTION OF BLADDER TUMOR (TURBT);  Surgeon: Senior Matilda, MD;  Location: [facility];  Service: Urology;  Laterality: N/A;   TRANSURETHRAL RESECTION OF BLADDER TUMOR N/A 03/20/2020   Procedure: TRANSURETHRAL RESECTION OF BLADDER TUMOR (TURBT);  Surgeon: Matilda Senior, MD;  Location: [facility];  Service: Urology;  Laterality: N/A;   VIDEO ASSISTED THORACOSCOPY (VATS)/WEDGE RESECTION Right 03/30/2017   Procedure: VIDEO ASSISTED THORACOSCOPY (VATS)/WEDGE RESECTION;  Surgeon: Kerrin Elspeth BROCKS, MD;  Location: [facility] OR;  Service: Thoracic;  Laterality: Right;   Family History  Problem Relation Age of Onset   Heart disease Father        Heart Disease before age 38   Alcohol abuse Father    Heart attack Father    Cancer Mother        Lung   COPD Mother    COPD Sister    Lupus Sister    Hypertension Brother    Gout Brother    Heart attack Son    Gout Brother    Prostatitis Brother    Social History   Socioeconomic History   Marital status: Married    Spouse name: Darvin Dials   Number of children: 2   Years of education: Not on file   Highest  education level: Not on file  Occupational History   Occupation: Retired    Comment: Merchandiser, retail with Dent[employer]  Tobacco Use   Smoking status: Former    Current packs/day: 0.00    Average packs/day: 1 pack/day for 51.0 years (51.0 ttl pk-yrs)    Types: Cigarettes    Start date: 11/25/1954    Quit date: 10/27/2005    Years since quitting: 18.1   Smokeless tobacco: Never  Vaping Use   Vaping status: Never Used  Substance and Sexual Activity   Alcohol use: No    Alcohol/week: 0.0 standard drinks of alcohol    Comment: none since 1999   Drug use: No   Sexual activity: Yes  Other Topics Concern   Not on file  Social History Narrative   2 sons that are deceased. 4 grandchildren and 3 great grandchildren. Lives at home with his wife. He stays active, plays golf, does yard work. Goes to [employer].   Social Drivers of Corporate investment banker Strain: Low Risk  (12/16/2023)   Overall Financial Resource Strain (CARDIA)    Difficulty of Paying Living Expenses: Not hard at all  Food Insecurity: No Food Insecurity (12/16/2023)   Hunger Vital Sign    Worried About Running Out of Food in the Last Year: Never true    Ran Out of Food in the Last Year: Never true  Transportation Needs: No Transportation Needs (12/16/2023)   PRAPARE - Administrator, Civil Service (Medical): No    Lack of Transportation (Non-Medical): No  Physical Activity: Sufficiently Active (12/16/2023)   Exercise Vital Sign    Days of Exercise per Week: 2 days    Minutes of Exercise per Session: 110 min  Stress: No Stress Concern Present (12/16/2023)   [employer] of Occupational Health - Occupational Stress Questionnaire    Feeling of Stress: Not at all  Social Connections: Socially Integrated (12/16/2023)   Social Connection and Isolation Panel    Frequency of Communication with Friends and Family: More than three times a week    Frequency of Social Gatherings with Friends and Family:  More than three times a week    Attends Religious Services: More than 4 times per year    Active Member of [employer] or Organizations: Yes    Attends Engineer, structural: More than 4 times per year    Marital Status: Married    Tobacco Counseling Counseling given: Yes    Clinical Intake:  Pre-visit preparation completed: Yes  Pain :  No/denies pain     BMI - recorded: 24.37 Nutritional Status: BMI of 19-24  Normal Nutritional Risks: None Diabetes: No  Lab Results  Component Value Date   HGBA1C 5.4 02/20/2017     How often do you need to have someone help you when you read instructions, pamphlets, or other written materials from your doctor or pharmacy?: 1 - Never  Interpreter Needed?: No  Information entered by :: Alia t/cma   Activities of Daily Living     12/16/2023   10:40 AM  In your present state of health, do you have any difficulty performing the following activities:  Hearing? 0  Vision? 0  Difficulty concentrating or making decisions? 0  Walking or climbing stairs? 0  Dressing or bathing? 0  Doing errands, shopping? 0  Preparing Food and eating ? N  Using the Toilet? N  In the past six months, have you accidently leaked urine? N  Do you have problems with loss of bowel control? N  Managing your Medications? N  Managing your Finances? N  Housekeeping or managing your Housekeeping? N    Patient Care Team: Gladis Mustard, FNP as PCP - General (Family Medicine) Lavona Agent, MD as PCP - Cardiology (Cardiology) Gladis Mustard, FNP as Nurse Practitioner (Nurse Practitioner) Shona Rush, MD (Dermatology) Serene Gaile ORN, MD as Consulting Physician (Vascular Surgery) Wonda Sharper, MD as Consulting Physician (Cardiology) Matilda Senior, MD as Consulting Physician (Urology) Kerrin Elspeth BROCKS, MD as Consulting Physician (Cardiothoracic Surgery)  I have updated your Care Teams any recent Medical Services you may have  received from other providers in the past year.     Assessment:   This is a routine wellness examination for [name] [name].  Hearing/Vision screen Hearing Screening - Comments:: Pt denies hearing dif Vision Screening - Comments:: Pt wear glasses/denies vision dif/[facility] upcoming apt in 7/25 per pt   Goals Addressed             This Visit's Progress    Patient Stated       Stay healthy and help families       Depression Screen     12/16/2023   10:43 AM 12/11/2023    8:05 AM 11/26/2023   12:51 PM 10/23/2023    2:00 PM 06/12/2023    8:19 AM 12/15/2022   11:28 AM 12/11/2022    8:13 AM  PHQ 2/9 Scores  PHQ - 2 Score 0 0 0 2 0 0 0  PHQ- 9 Score 0  0 4  0 2    Fall Risk     12/16/2023   10:49 AM 12/11/2023    8:04 AM 10/23/2023    1:58 PM 06/12/2023    8:19 AM 12/15/2022   11:26 AM  Fall Risk   Falls in the past year? 0 0 1 1 0  Number falls in past yr: 0  1 0 0  Injury with Fall? 0  0 0 0  Risk for fall due to : No Fall Risks  History of fall(s) History of fall(s) No Fall Risks  Follow up Falls evaluation completed  Education provided Education provided Falls prevention discussed    MEDICARE RISK AT HOME:  Medicare Risk at Home Any stairs in or around the home?: Yes If so, are there any without handrails?: Yes Home free of loose throw rugs in walkways, pet beds, electrical cords, etc?: Yes Adequate lighting in your home to reduce risk of falls?: Yes Life alert?: No Use of a  cane, walker or w/c?: No Grab bars in the bathroom?: Yes Shower chair or bench in shower?: Yes Elevated toilet seat or a handicapped toilet?: Yes  TIMED UP AND GO:  Was the test performed?  no  Cognitive Function: 6CIT completed    06/10/2018   10:22 AM 05/27/2017    8:58 AM 05/26/2016    9:55 AM 12/05/2014    9:13 AM  MMSE - Mini Mental State Exam  Orientation to time 5 5  5  5    Orientation to Place 5 5  5  5    Registration 3 3  3  3    Attention/ Calculation 5 5  5  5     Recall 3 2  3  2    Language- name 2 objects 2 2  2  2    Language- repeat 1 1 1 1   Language- follow 3 step command 3 3  3  3    Language- read & follow direction 1 1  1  1    Write a sentence 1 1  1  1    Copy design 1 1  1  1    Total score 30 29  30  29       Data saved with a previous flowsheet row definition        12/16/2023   10:45 AM 12/15/2022   11:29 AM 12/09/2021   11:22 AM 12/06/2020    1:45 PM  6CIT Screen  What Year? 0 points 0 points 0 points 0 points  What month? 0 points 0 points 0 points 0 points  What time? 0 points 0 points 0 points 0 points  Count back from 20 0 points 0 points 0 points 0 points  Months in reverse 0 points 0 points 2 points 0 points  Repeat phrase 2 points 0 points 0 points 0 points  Total Score 2 points 0 points 2 points 0 points    Immunizations Immunization History  Administered Date(s) Administered   Fluad Quad(high Dose 65+) 03/08/2019, 03/08/2019, 03/14/2019, 03/27/2020, 03/18/2021, 03/21/2022   Fluzone Influenza virus vaccine,trivalent (IIV3), split virus 03/19/2017   Influenza Inj Mdck Quad With Preservative 03/18/2018   Influenza Split 03/23/2013   Influenza, High Dose Seasonal PF 03/06/2014   Influenza,inj,Quad PF,6+ Mos 03/22/2015, 03/18/2017   Influenza,inj,quad, With Preservative 03/22/2016   Influenza-Unspecified 04/17/2009, 03/19/2010, 03/18/2011, 03/06/2014, 03/22/2016, 03/18/2018, 03/02/2019, 03/27/2020, 03/17/2023   Moderna Covid-19 Fall Seasonal Vaccine 73yrs & older 06/20/2022, 04/13/2023   Moderna Covid-19 Vaccine Bivalent Booster 49yrs & up 07/02/2021   Moderna Sars-Covid-2 Vaccination 08/02/2019, 08/30/2019, 04/24/2020, 02/19/2021   Pneumococcal Conjugate-13 04/26/2013   Pneumococcal Polysaccharide-23 06/23/2004, 12/11/2014   Td 03/24/2007   Tdap 12/05/2021   Zoster Recombinant(Shingrix) 07/04/2020, 09/14/2020    Screening Tests Health Maintenance  Topic Date Due   Medicare Annual Wellness (AWV)  12/15/2023    COVID-19 Vaccine (8 - Moderna risk 2024-25 season) 12/31/2024 (Originally 10/12/2023)   INFLUENZA VACCINE  01/22/2024   DTaP/Tdap/Td (3 - Td or Tdap) 12/06/2031   Pneumococcal Vaccine: 50+ Years  Completed   Zoster Vaccines- Shingrix  Completed   Hepatitis B Vaccines  Aged Out   HPV VACCINES  Aged Out   Meningococcal B Vaccine  Aged Out    Health Maintenance  Health Maintenance Due  Topic Date Due   Medicare Annual Wellness (AWV)  12/15/2023   Health Maintenance Items Addressed: See Nurse Notes at the end of this note  Additional Screening:  Vision Screening: Recommended annual ophthalmology exams for early detection of glaucoma and  other disorders of the eye. Would you like a referral to an eye doctor? No    Dental Screening: Recommended annual dental exams for proper oral hygiene  Community Resource Referral / Chronic Care Management: CRR required this visit?  No   CCM required this visit?  No   Plan:    I have personally reviewed and noted the following in the patient's chart:   Medical and social history Use of alcohol, tobacco or illicit drugs  Current medications and supplements including opioid prescriptions. Patient is not currently taking opioid prescriptions. Functional ability and status Nutritional status Physical activity Advanced directives List of other physicians Hospitalizations, surgeries, and ER visits in previous 12 months Vitals Screenings to include cognitive, depression, and falls Referrals and appointments  In addition, I have reviewed and discussed with patient certain preventive protocols, quality metrics, and best practice recommendations. A written personalized care plan for preventive services as well as general preventive health recommendations were provided to patient.   Greycen Felter, CMA   12/16/2023   After Visit Summary: (Declined) Due to this being a telephonic visit, with patients personalized plan was offered to patient but  patient Declined AVS at this time   Notes: Nothing significant to report at this time.

## 2023-12-16 NOTE — Patient Instructions (Signed)
 [name] [name] , Thank you for taking time out of your busy schedule to complete your Annual Wellness Visit with me. I enjoyed our conversation and look forward to speaking with you again next year. I, as well as your care team,  appreciate your ongoing commitment to your health goals. Please review the following plan we discussed and let me know if I can assist you in the future. Your Game plan/ To Do List    Follow up Visits: Next Medicare AWV with our clinical staff: 12/16/24 at 10:00a.m.   Next Office Visit with your provider: 06/10/24 at 9:15a.m.  Clinician Recommendations:  Aim for 30 minutes of exercise or brisk walking, 6-8 glasses of water , and 5 servings of fruits and vegetables each day.       This is a list of the screening recommended for you and due dates:  Health Maintenance  Topic Date Due   Medicare Annual Wellness Visit  12/15/2023   COVID-19 Vaccine (8 - Moderna risk 2024-25 season) 12/31/2024*   Flu Shot  01/22/2024   DTaP/Tdap/Td vaccine (3 - Td or Tdap) 12/06/2031   Pneumococcal Vaccine for age over 54  Completed   Zoster (Shingles) Vaccine  Completed   Hepatitis B Vaccine  Aged Out   HPV Vaccine  Aged Out   Meningitis B Vaccine  Aged Out  *Topic was postponed. The date shown is not the original due date.    Advanced directives: (Copy Requested) Please bring a copy of your health care power of attorney and living will to the office to be added to your chart at your convenience. You can mail to [facility] [address] [place], [place] [address] or email to [email or web address] Advance Care Planning is important because it:  [x]  Makes sure you receive the medical care that is consistent with your values, goals, and preferences  [x]  It provides guidance to your family and loved ones and reduces their decisional burden about whether or not they are making the right decisions based on your wishes.  Follow the link provided in your after visit  summary or read over the paperwork we have mailed to you to help you started getting your Advance Directives in place. If you need assistance in completing these, please reach out to us  so that we can help you!  See attachments for Preventive Care and Fall Prevention Tips.

## 2023-12-17 ENCOUNTER — Other Ambulatory Visit: Payer: Self-pay

## 2023-12-17 DIAGNOSIS — D649 Anemia, unspecified: Secondary | ICD-10-CM

## 2023-12-24 ENCOUNTER — Other Ambulatory Visit

## 2023-12-24 DIAGNOSIS — D649 Anemia, unspecified: Secondary | ICD-10-CM | POA: Diagnosis not present

## 2023-12-24 LAB — CBC WITH DIFFERENTIAL/PLATELET
Basophils Absolute: 0 10*3/uL (ref 0.0–0.2)
Basos: 1 %
EOS (ABSOLUTE): 0.2 10*3/uL (ref 0.0–0.4)
Eos: 3 %
Hematocrit: 36.9 % — ABNORMAL LOW (ref 37.5–51.0)
Hemoglobin: 11.9 g/dL — ABNORMAL LOW (ref 13.0–17.7)
Immature Grans (Abs): 0 10*3/uL (ref 0.0–0.1)
Immature Granulocytes: 0 %
Lymphocytes Absolute: 1.5 10*3/uL (ref 0.7–3.1)
Lymphs: 28 %
MCH: 31.6 pg (ref 26.6–33.0)
MCHC: 32.2 g/dL (ref 31.5–35.7)
MCV: 98 fL — ABNORMAL HIGH (ref 79–97)
Monocytes Absolute: 0.5 10*3/uL (ref 0.1–0.9)
Monocytes: 9 %
Neutrophils Absolute: 3.2 10*3/uL (ref 1.4–7.0)
Neutrophils: 59 %
Platelets: 125 10*3/uL — ABNORMAL LOW (ref 150–450)
RBC: 3.77 x10E6/uL — ABNORMAL LOW (ref 4.14–5.80)
RDW: 13.1 % (ref 11.6–15.4)
WBC: 5.3 10*3/uL (ref 3.4–10.8)

## 2023-12-25 ENCOUNTER — Ambulatory Visit: Payer: Self-pay | Admitting: Nurse Practitioner

## 2023-12-26 ENCOUNTER — Other Ambulatory Visit: Payer: Self-pay | Admitting: Nurse Practitioner

## 2023-12-26 DIAGNOSIS — I4821 Permanent atrial fibrillation: Secondary | ICD-10-CM

## 2024-01-26 ENCOUNTER — Other Ambulatory Visit: Payer: Self-pay | Admitting: Urology

## 2024-01-26 DIAGNOSIS — Z8551 Personal history of malignant neoplasm of bladder: Secondary | ICD-10-CM | POA: Diagnosis not present

## 2024-01-26 DIAGNOSIS — N3000 Acute cystitis without hematuria: Secondary | ICD-10-CM | POA: Diagnosis not present

## 2024-01-26 DIAGNOSIS — R82998 Other abnormal findings in urine: Secondary | ICD-10-CM | POA: Diagnosis not present

## 2024-01-26 DIAGNOSIS — R300 Dysuria: Secondary | ICD-10-CM | POA: Diagnosis not present

## 2024-01-26 DIAGNOSIS — Z85528 Personal history of other malignant neoplasm of kidney: Secondary | ICD-10-CM | POA: Diagnosis not present

## 2024-01-27 ENCOUNTER — Ambulatory Visit

## 2024-01-27 ENCOUNTER — Other Ambulatory Visit

## 2024-01-27 NOTE — Progress Notes (Signed)
 Pt came and left specimen per MD Dahlstedt request package arranged to meet [facility] standards  Tracking [id number] Confirmation # [id number]

## 2024-01-28 ENCOUNTER — Telehealth: Payer: Self-pay

## 2024-01-28 NOTE — Telephone Encounter (Signed)
 Dianon - [facility] left a voice message 01-28-2024  Asking for verification on a recent lab.  Call:  [phone] [place] Kid

## 2024-01-28 NOTE — Telephone Encounter (Signed)
 Called Matt to verify MD (Dahlstedt) and to give him collection date (08/06)

## 2024-02-01 ENCOUNTER — Encounter: Payer: Self-pay | Admitting: Urology

## 2024-02-01 LAB — URINE CYTOLOGY: CYTOLOGY PLUS /FISH

## 2024-02-01 LAB — UROVYSION FISH

## 2024-02-02 ENCOUNTER — Ambulatory Visit: Payer: Self-pay

## 2024-02-02 NOTE — Telephone Encounter (Signed)
-----   Message from Garnette HERO Dahlstedt sent at 02/02/2024  9:58 AM EDT ----- Please call pt--good news, cytology normal. Keep scheduled follow up date. ----- Message ----- From: Interface, [facility] Lab Results In Sent: 02/02/2024   5:07 AM EDT To: Garnette Shack, MD

## 2024-02-02 NOTE — Telephone Encounter (Signed)
 Called pt and left detailed vm regarding cytology results from MD Dahlstedt

## 2024-02-02 NOTE — Telephone Encounter (Signed)
 Patient was made and voiced understanding.

## 2024-02-13 ENCOUNTER — Other Ambulatory Visit: Payer: Self-pay | Admitting: Nurse Practitioner

## 2024-02-13 DIAGNOSIS — I10 Essential (primary) hypertension: Secondary | ICD-10-CM

## 2024-02-24 ENCOUNTER — Other Ambulatory Visit: Payer: Self-pay | Admitting: Nurse Practitioner

## 2024-03-05 ENCOUNTER — Other Ambulatory Visit: Payer: Self-pay | Admitting: Nurse Practitioner

## 2024-03-05 DIAGNOSIS — K219 Gastro-esophageal reflux disease without esophagitis: Secondary | ICD-10-CM

## 2024-03-05 DIAGNOSIS — E034 Atrophy of thyroid (acquired): Secondary | ICD-10-CM

## 2024-03-05 DIAGNOSIS — N400 Enlarged prostate without lower urinary tract symptoms: Secondary | ICD-10-CM

## 2024-03-05 DIAGNOSIS — E782 Mixed hyperlipidemia: Secondary | ICD-10-CM

## 2024-03-05 DIAGNOSIS — I25118 Atherosclerotic heart disease of native coronary artery with other forms of angina pectoris: Secondary | ICD-10-CM

## 2024-03-26 ENCOUNTER — Other Ambulatory Visit: Payer: Self-pay | Admitting: Nurse Practitioner

## 2024-03-26 DIAGNOSIS — I4821 Permanent atrial fibrillation: Secondary | ICD-10-CM

## 2024-04-05 ENCOUNTER — Ambulatory Visit (HOSPITAL_COMMUNITY)
Admission: RE | Admit: 2024-04-05 | Discharge: 2024-04-05 | Disposition: A | Discharge status: Home / Self Care | Source: Ambulatory Visit | Attending: Internal Medicine | Admitting: Internal Medicine

## 2024-04-05 ENCOUNTER — Inpatient Hospital Stay: Payer: Medicare Other | Attending: Internal Medicine

## 2024-04-05 DIAGNOSIS — C3411 Malignant neoplasm of upper lobe, right bronchus or lung: Secondary | ICD-10-CM | POA: Diagnosis present

## 2024-04-05 DIAGNOSIS — J432 Centrilobular emphysema: Secondary | ICD-10-CM | POA: Diagnosis not present

## 2024-04-05 DIAGNOSIS — Z85118 Personal history of other malignant neoplasm of bronchus and lung: Secondary | ICD-10-CM | POA: Diagnosis not present

## 2024-04-05 DIAGNOSIS — J929 Pleural plaque without asbestos: Secondary | ICD-10-CM | POA: Diagnosis not present

## 2024-04-05 DIAGNOSIS — C3490 Malignant neoplasm of unspecified part of unspecified bronchus or lung: Secondary | ICD-10-CM | POA: Insufficient documentation

## 2024-04-05 DIAGNOSIS — I2510 Atherosclerotic heart disease of native coronary artery without angina pectoris: Secondary | ICD-10-CM | POA: Diagnosis not present

## 2024-04-05 LAB — CMP (CANCER CENTER ONLY)
ALT: 11 U/L (ref 0–44)
AST: 25 U/L (ref 15–41)
Albumin: 4 g/dL (ref 3.5–5.0)
Alkaline Phosphatase: 70 U/L (ref 38–126)
Anion gap: 7 (ref 5–15)
BUN: 15 mg/dL (ref 8–23)
CO2: 28 mmol/L (ref 22–32)
Calcium: 9.4 mg/dL (ref 8.9–10.3)
Chloride: 109 mmol/L (ref 98–111)
Creatinine: 1.17 mg/dL (ref 0.61–1.24)
GFR, Estimated: >60 mL/min (ref >60)
Glucose, Bld: 97 mg/dL (ref 70–99)
Potassium: 3.8 mmol/L (ref 3.5–5.1)
Sodium: 144 mmol/L (ref 135–145)
Total Bilirubin: 0.6 mg/dL (ref 0.0–1.2)
Total Protein: 7.4 g/dL (ref 6.5–8.1)

## 2024-04-05 LAB — CBC WITH DIFFERENTIAL (CANCER CENTER ONLY)
Abs Immature Granulocytes: 0 K/uL (ref 0.00–0.07)
Basophils Absolute: 0 K/uL (ref 0.0–0.1)
Basophils Relative: 0 %
Eosinophils Absolute: 0.1 K/uL (ref 0.0–0.5)
Eosinophils Relative: 3 %
HCT: 30.8 % — ABNORMAL LOW (ref 39.0–52.0)
Hemoglobin: 10.1 g/dL — ABNORMAL LOW (ref 13.0–17.0)
Immature Granulocytes: 0 %
Lymphocytes Relative: 36 %
Lymphs Abs: 1.4 K/uL (ref 0.7–4.0)
MCH: 31.6 pg (ref 26.0–34.0)
MCHC: 32.8 g/dL (ref 30.0–36.0)
MCV: 96.3 fL (ref 80.0–100.0)
Monocytes Absolute: 0.4 K/uL (ref 0.1–1.0)
Monocytes Relative: 9 %
Neutro Abs: 2 K/uL (ref 1.7–7.7)
Neutrophils Relative %: 52 %
Platelet Count: 148 K/uL — ABNORMAL LOW (ref 150–400)
RBC: 3.2 MIL/uL — ABNORMAL LOW (ref 4.22–5.81)
RDW: 13.5 % (ref 11.5–15.5)
WBC Count: 3.9 K/uL — ABNORMAL LOW (ref 4.0–10.5)
nRBC: 0 % (ref 0.0–0.2)

## 2024-04-05 MED ORDER — IOHEXOL 300 MG/ML  SOLN
75.0000 mL | Freq: Once | INTRAMUSCULAR | Status: AC | PRN
  Administered 2024-04-05: 75 mL via INTRAVENOUS

## 2024-04-12 ENCOUNTER — Inpatient Hospital Stay: Payer: Medicare Other | Admitting: Internal Medicine

## 2024-04-12 VITALS — BP 144/55 | HR 65 | Temp 97.5°F | Resp 17 | Ht 69.0 in | Wt 166.0 lb

## 2024-04-12 DIAGNOSIS — C3411 Malignant neoplasm of upper lobe, right bronchus or lung: Secondary | ICD-10-CM | POA: Diagnosis not present

## 2024-04-12 DIAGNOSIS — C3491 Malignant neoplasm of unspecified part of right bronchus or lung: Secondary | ICD-10-CM

## 2024-04-12 NOTE — Progress Notes (Signed)
 [facility] Telephone:([phone]   [phone]  OFFICE PROGRESS NOTE  [name] Mustard, FNP [address] [place] [address]  DIAGNOSIS: Stage IA (T1a, N0, M0) non-small cell lung cancer, squamous cell carcinoma presented with right upper lobe lung nodule   PRIOR THERAPY: status post right upper lobectomy with lymph node dissection on March 30, 2017 under the care of Dr. Kerrin.  CURRENT THERAPY: Observation.  INTERVAL HISTORY: [name] [name] 83 y.o. male returns to the clinic today for annual follow-up visit accompanied by his wife.Discussed the use of AI scribe software for clinical note transcription with the patient, who gave verbal consent to proceed.  History of Present Illness RONAL [name] is an 83 year old male with stage one non-small cell lung cancer who presents for evaluation with repeat CT scan of the chest for restage of his disease. He is accompanied by his wife.  He has a history of right upper lobe lung cancer and underwent a right upper lobectomy with lymph node dissection in October 2018. Since the surgery, he has been on observation and has not required further treatment.  He reports no new symptoms or changes in his health status since his last visit a year ago. No chest pain, breathing issues, nausea, vomiting, diarrhea, headaches, or weight loss. His weight remains stable at 166 pounds.  He has been married for 64 years, and his wife accompanies him to his appointments.    MEDICAL HISTORY: Past Medical History:  Diagnosis Date   AAA (abdominal aortic aneurysm)    Aortic stenosis, severe    a. 02/2017: s/p TAVR with Edwards Sapien 3 THV (size 26 mm, model # 9600TFX, serial # [id number])   Arthritis    Atherosclerotic peripheral vascular disease    Atrial fibrillation, persistent (HCC)    a. not a candidate for OAC given hx of GI bleed   Bilateral carotid artery stenosis    Bladder cancer (HCC)     COPD (chronic obstructive pulmonary disease) (HCC)    Gastroesophageal reflux disease without esophagitis 05/25/2019   Hyperlipidemia    Hypertension    Hypothyroidism    Lung cancer (HCC)    Right   Lung mass    Right   Skin cancer    Vitamin D  deficiency     ALLERGIES:  is allergic to lovenox  [enoxaparin ].  MEDICATIONS:  Current Outpatient Medications  Medication Sig Dispense Refill   albuterol  (VENTOLIN  HFA) 108 (90 Base) MCG/ACT inhaler Inhale into the lungs.     alfuzosin  (UROXATRAL ) 10 MG 24 hr tablet TAKE ONE TABLET AT BEDTIME 90 tablet 1   amLODipine  (NORVASC ) 10 MG tablet TAKE ONE TABLET DAILY 90 tablet 1   amoxicillin -clavulanate (AUGMENTIN ) 875-125 MG tablet Take 1 tablet by mouth 2 (two) times daily. Take all of this medication 20 tablet 0   atorvastatin  (LIPITOR) 40 MG tablet TAKE ONE TABLET DAILY 90 tablet 1   cholecalciferol  (VITAMIN D ) 1000 UNITS tablet Take 1 tablet (1,000 Units total) by mouth daily. 30 tablet 6   ELIQUIS  5 MG TABS tablet TAKE ONE TABLET TWICE DAILY 180 tablet 0   ferrous gluconate  (FERGON) 324 MG tablet TAKE ONE TABLET TWICE DAILY WITH MEAL(S) 180 tablet 1   fluticasone  (FLONASE ) 50 MCG/ACT nasal spray Place 2 sprays into both nostrils daily. 16 g 6   furosemide  (LASIX ) 20 MG tablet TAKE 1 TABLET DAILY 30 tablet 4   levothyroxine  (SYNTHROID ) 88 MCG tablet TAKE ONE TABLET  DAILY 90 tablet 1   lisinopril  (ZESTRIL ) 40 MG tablet TAKE ONE TABLET DAILY 90 tablet 1   metoprolol  succinate (TOPROL -XL) 25 MG 24 hr tablet TAKE 1/2 TABLET DAILY 45 tablet 1   Multiple Vitamin (MULTIVITAMIN WITH MINERALS) TABS tablet Take 1 tablet by mouth daily. One A Day Multivitamin for Men     Omega-3 Fatty Acids (FISH OIL) 1200 MG CAPS Take 2,400 mg by mouth daily.      pantoprazole  (PROTONIX ) 40 MG tablet TAKE ONE TABLET TWICE DAILY 180 tablet 1   promethazine -dextromethorphan (PROMETHAZINE -DM) 6.25-15 MG/5ML syrup Take 5 mLs by mouth 4 (four) times daily as needed for  cough. 240 mL 0   No current facility-administered medications for this visit.    SURGICAL HISTORY:  Past Surgical History:  Procedure Laterality Date   ABDOMINAL AORTIC ANEURYSM REPAIR  08/09/2009   BACK SURGERY     x2   CATARACT EXTRACTION BILATERAL W/ ANTERIOR VITRECTOMY Bilateral 2022   CYSTOSCOPY W/ RETROGRADES Bilateral 09/02/2018   Procedure: CYSTOSCOPY WITH RETROGRADE PYELOGRAM;  Surgeon: Matilda Senior, MD;  Location: [facility];  Service: Urology;  Laterality: Bilateral;   CYSTOSCOPY W/ RETROGRADES Bilateral 03/20/2020   Procedure: CYSTOSCOPY WITH BILATERAL RETROGRADE PYELOGRAM;  Surgeon: Matilda Senior, MD;  Location: [facility];  Service: Urology;  Laterality: Bilateral;   CYSTOSCOPY WITH BIOPSY N/A 09/02/2018   Procedure: CYSTOSCOPY WITH BIOPSY;  Surgeon: Matilda Senior, MD;  Location: [facility];  Service: Urology;  Laterality: N/A;   ESOPHAGOGASTRODUODENOSCOPY N/A 02/21/2017   Procedure: ESOPHAGOGASTRODUODENOSCOPY (EGD);  Surgeon: Albertus Gordy HERO, MD;  Location: [facility] END[facility]COPY;  Service: Gastroenterology;  Laterality: N/A;   HYDROCELE EXCISION / REPAIR     LOBECTOMY Right 03/30/2017   Procedure: RIGHT UPPER LOBECTOMY LUNG;  Surgeon: Kerrin Elspeth BROCKS, MD;  Location: [facility] OR;  Service: Thoracic;  Laterality: Right;   SKIN CANCER EXCISION     SPINE SURGERY     TEE WITHOUT CARDIOVERSION N/A 02/24/2017   Procedure: TRANSESOPHAGEAL ECHOCARDIOGRAM (TEE);  Surgeon: Wonda Sharper, MD;  Location: [facility] OR;  Service: Open Heart Surgery;  Laterality: N/A;   TRANSCATHETER AORTIC VALVE REPLACEMENT, TRANSFEMORAL N/A 02/24/2017   Procedure: TRANSCATHETER AORTIC VALVE REPLACEMENT, TRANSFEMORAL using a 26mm Edwards Sapien 3 Aortic Valve;  Surgeon: Wonda Sharper, MD;  Location: [facility] OR;  Service: Open Heart Surgery;  Laterality: N/A;   TRANSURETHRAL RESECTION OF BLADDER TUMOR N/A 06/06/2016   Procedure: TRANSURETHRAL RESECTION OF BLADDER TUMOR (TURBT);   Surgeon: Senior Matilda, MD;  Location: [facility];  Service: Urology;  Laterality: N/A;   TRANSURETHRAL RESECTION OF BLADDER TUMOR N/A 07/22/2016   Procedure: TRANSURETHRAL RESECTION OF BLADDER TUMOR (TURBT);  Surgeon: Senior Matilda, MD;  Location: [facility];  Service: Urology;  Laterality: N/A;   TRANSURETHRAL RESECTION OF BLADDER TUMOR N/A 03/20/2020   Procedure: TRANSURETHRAL RESECTION OF BLADDER TUMOR (TURBT);  Surgeon: Matilda Senior, MD;  Location: [facility];  Service: Urology;  Laterality: N/A;   VIDEO ASSISTED THORACOSCOPY (VATS)/WEDGE RESECTION Right 03/30/2017   Procedure: VIDEO ASSISTED THORACOSCOPY (VATS)/WEDGE RESECTION;  Surgeon: Kerrin Elspeth BROCKS, MD;  Location: [facility] OR;  Service: Thoracic;  Laterality: Right;    REVIEW OF SYSTEMS:  A comprehensive review of systems was negative.   PHYSICAL EXAMINATION: General appearance: alert, cooperative, and no distress Head: Normocephalic, without obvious abnormality, atraumatic Neck: no adenopathy, no JVD, supple, symmetrical, trachea midline, and thyroid  not enlarged, symmetric, no tenderness/mass/nodules Lymph nodes: Cervical, supraclavicular, and axillary nodes normal. Resp: clear to auscultation bilaterally Back: symmetric, no curvature.  ROM normal. No CVA tenderness. Cardio: irregularly irregular rhythm GI: soft, non-tender; bowel sounds normal; no masses,  no organomegaly Extremities: extremities normal, atraumatic, no cyanosis or edema  ECOG PERFORMANCE STATUS: 1 - Symptomatic but completely ambulatory  Blood pressure (!) 144/55, pulse 65, temperature (!) 97.5 F (36.4 C), resp. rate 17, height 5' 9 (1.753 m), weight 166 lb (75.3 kg), SpO2 100%.  LABORATORY DATA: Lab Results  Component Value Date   WBC 3.9 (L) 04/05/2024   HGB 10.1 (L) 04/05/2024   HCT 30.8 (L) 04/05/2024   MCV 96.3 04/05/2024   PLT 148 (L) 04/05/2024      Chemistry      Component Value Date/Time   NA 144 04/05/2024 0954   NA 147 (H)  12/11/2023 0822   NA 140 05/07/2017 1419   K 3.8 04/05/2024 0954   K 4.2 05/07/2017 1419   CL 109 04/05/2024 0954   CO2 28 04/05/2024 0954   CO2 25 05/07/2017 1419   BUN 15 04/05/2024 0954   BUN 17 12/11/2023 0822   BUN 14.5 05/07/2017 1419   CREATININE 1.17 04/05/2024 0954   CREATININE 1.0 05/07/2017 1419      Component Value Date/Time   CALCIUM  9.4 04/05/2024 0954   CALCIUM  9.6 05/07/2017 1419   ALKPHOS 70 04/05/2024 0954   ALKPHOS 85 05/07/2017 1419   AST 25 04/05/2024 0954   AST 23 05/07/2017 1419   ALT 11 04/05/2024 0954   ALT 14 05/07/2017 1419   BILITOT 0.6 04/05/2024 0954   BILITOT 0.48 05/07/2017 1419       RADIOGRAPHIC STUDIES: CT Chest W Contrast Result Date: 04/05/2024 CLINICAL DATA:  History of non-small-cell lung cancer status post right upper lobectomy. * Tracking Code: BO * EXAM: CT CHEST WITH CONTRAST TECHNIQUE: Multidetector CT imaging of the chest was performed during intravenous contrast administration. RADIATION DOSE REDUCTION: This exam was performed according to the departmental dose-optimization program which includes automated exposure control, adjustment of the mA and/or kV according to patient size and/or use of iterative reconstruction technique. CONTRAST:  75mL OMNIPAQUE  IOHEXOL  300 MG/ML  SOLN COMPARISON:  CT chest dated 04/13/2023 and multiple priors FINDINGS: Cardiovascular: Status post aortic valve replacement. Multichamber cardiomegaly. 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 No significant pericardial fluid/thickening. Dilated right main pulmonary artery. No central pulmonary emboli. Coronary artery calcifications and aortic atherosclerosis. Mediastinum/Nodes: Imaged thyroid  gland without nodules meeting criteria for imaging follow-up by size. Normal esophagus. No pathologically enlarged axillary, supraclavicular, mediastinal, or hilar lymph nodes. Lungs/Pleura: The central airways are patent.  Postsurgical changes of right upper lobectomy. Unchanged right inferior posterior pleural thickening. Moderate to severe centrilobular and paraseptal emphysema. Unchanged subpleural 5 mm right lower lobe nodule (8:117) and 3 mm posteromedial left lower lobe ground-glass nodule (8:64). No pneumothorax. No pleural effusion. Upper abdomen: Partially imaged abdominal aortic stent graft in-situ. Cholelithiasis. Musculoskeletal: No acute or abnormal lytic or blastic osseous lesions. IMPRESSION: 1. Postsurgical changes of right upper lobectomy without evidence of local recurrence or metastatic disease in the chest. 2. Dilated right main pulmonary artery, which can be seen in the setting of pulmonary arterial hypertension. 3. Aortic Atherosclerosis (ICD10-I70.0) and Emphysema (ICD10-J43.9). Coronary artery calcifications. Assessment for potential risk factor modification, dietary therapy or pharmacologic therapy may be warranted, if clinically indicated. Electronically Signed   By: Limin  Xu M.D.   On: 04/05/2024 21:43     ASSESSMENT AND PLAN: This is a very pleasant 83 years old white male with  a stage IA non-small cell lung cancer status post right upper lobectomy with lymph node dissection in October 2018. The patient has been on observation since that time and he is feeling fine with no concerning complaints except for the atrial fibrillation managed by his cardiologist. He had repeat CT scan of the chest performed recently.  I personally independently reviewed the scan and discussed the results with the patient and his wife.  His scan showed no concerning findings for disease recurrence or metastasis. Assessment and Plan Assessment & Plan History of right upper lobe lung cancer, post-lobectomy Stage I small cell lung cancer, squamous cell carcinoma, status post right upper lobectomy with lymph node dissection in October 2018. No new symptoms or changes in health status. Recent CT scan shows no evidence of  disease progression or metastasis. Seven years post-diagnosis with stable condition. - Discontinued annual CT scans. - Transitioned follow-up care to primary care provider, Ronal Lunger, FNP. He was advised to call immediately if he has any other concerning symptoms.  The patient voices understanding of current disease status and treatment options and is in agreement with the current care plan. All questions were answered. The patient knows to call the clinic with any problems, questions or concerns. We can certainly see the patient much sooner if necessary.    Disclaimer: This note was dictated with voice recognition software. Similar sounding words can inadvertently be transcribed and may not be corrected upon review.

## 2024-05-11 ENCOUNTER — Other Ambulatory Visit: Payer: Self-pay | Admitting: Surgery

## 2024-05-11 DIAGNOSIS — Z8679 Personal history of other diseases of the circulatory system: Secondary | ICD-10-CM

## 2024-06-10 ENCOUNTER — Ambulatory Visit: Payer: Self-pay | Admitting: Nurse Practitioner

## 2024-06-10 ENCOUNTER — Encounter: Payer: Self-pay | Admitting: Nurse Practitioner

## 2024-06-10 VITALS — BP 141/65 | HR 66 | Temp 98.0°F | Ht 69.0 in | Wt 163.0 lb

## 2024-06-10 DIAGNOSIS — I6523 Occlusion and stenosis of bilateral carotid arteries: Secondary | ICD-10-CM

## 2024-06-10 DIAGNOSIS — I350 Nonrheumatic aortic (valve) stenosis: Secondary | ICD-10-CM

## 2024-06-10 DIAGNOSIS — E559 Vitamin D deficiency, unspecified: Secondary | ICD-10-CM

## 2024-06-10 DIAGNOSIS — K219 Gastro-esophageal reflux disease without esophagitis: Secondary | ICD-10-CM | POA: Diagnosis not present

## 2024-06-10 DIAGNOSIS — J449 Chronic obstructive pulmonary disease, unspecified: Secondary | ICD-10-CM

## 2024-06-10 DIAGNOSIS — N400 Enlarged prostate without lower urinary tract symptoms: Secondary | ICD-10-CM

## 2024-06-10 DIAGNOSIS — I480 Paroxysmal atrial fibrillation: Secondary | ICD-10-CM | POA: Diagnosis not present

## 2024-06-10 DIAGNOSIS — E034 Atrophy of thyroid (acquired): Secondary | ICD-10-CM | POA: Diagnosis not present

## 2024-06-10 DIAGNOSIS — Z6826 Body mass index (BMI) 26.0-26.9, adult: Secondary | ICD-10-CM | POA: Diagnosis not present

## 2024-06-10 DIAGNOSIS — I10 Essential (primary) hypertension: Secondary | ICD-10-CM

## 2024-06-10 DIAGNOSIS — E782 Mixed hyperlipidemia: Secondary | ICD-10-CM | POA: Diagnosis not present

## 2024-06-10 DIAGNOSIS — I739 Peripheral vascular disease, unspecified: Secondary | ICD-10-CM

## 2024-06-10 DIAGNOSIS — I25118 Atherosclerotic heart disease of native coronary artery with other forms of angina pectoris: Secondary | ICD-10-CM

## 2024-06-10 DIAGNOSIS — C3491 Malignant neoplasm of unspecified part of right bronchus or lung: Secondary | ICD-10-CM

## 2024-06-10 LAB — CMP14+EGFR
ALT: 13 IU/L (ref 0–44)
AST: 28 IU/L (ref 0–40)
Albumin: 4.3 g/dL (ref 3.7–4.7)
Alkaline Phosphatase: 75 IU/L (ref 48–129)
BUN/Creatinine Ratio: 12 (ref 10–24)
BUN: 15 mg/dL (ref 8–27)
Bilirubin Total: 0.5 mg/dL (ref 0.0–1.2)
CO2: 23 mmol/L (ref 20–29)
Calcium: 9.3 mg/dL (ref 8.6–10.2)
Chloride: 104 mmol/L (ref 96–106)
Creatinine, Ser: 1.25 mg/dL (ref 0.76–1.27)
Globulin, Total: 3 g/dL (ref 1.5–4.5)
Glucose: 98 mg/dL (ref 70–99)
Potassium: 4.5 mmol/L (ref 3.5–5.2)
Sodium: 140 mmol/L (ref 134–144)
Total Protein: 7.3 g/dL (ref 6.0–8.5)
eGFR: 57 mL/min/1.73 — ABNORMAL LOW

## 2024-06-10 LAB — LIPID PANEL
Chol/HDL Ratio: 3.2 ratio (ref 0.0–5.0)
Cholesterol, Total: 92 mg/dL — ABNORMAL LOW (ref 100–199)
HDL: 29 mg/dL — ABNORMAL LOW
LDL Chol Calc (NIH): 42 mg/dL (ref 0–99)
Triglycerides: 113 mg/dL (ref 0–149)
VLDL Cholesterol Cal: 21 mg/dL (ref 5–40)

## 2024-06-10 LAB — CBC WITH DIFFERENTIAL/PLATELET
Basophils Absolute: 0 x10E3/uL (ref 0.0–0.2)
Basos: 1 %
EOS (ABSOLUTE): 0.1 x10E3/uL (ref 0.0–0.4)
Eos: 3 %
Hematocrit: 30.9 % — ABNORMAL LOW (ref 37.5–51.0)
Hemoglobin: 10.2 g/dL — ABNORMAL LOW (ref 13.0–17.7)
Immature Grans (Abs): 0 x10E3/uL (ref 0.0–0.1)
Immature Granulocytes: 0 %
Lymphocytes Absolute: 1.3 x10E3/uL (ref 0.7–3.1)
Lymphs: 36 %
MCH: 33.7 pg — ABNORMAL HIGH (ref 26.6–33.0)
MCHC: 33 g/dL (ref 31.5–35.7)
MCV: 102 fL — ABNORMAL HIGH (ref 79–97)
Monocytes Absolute: 0.4 x10E3/uL (ref 0.1–0.9)
Monocytes: 10 %
Neutrophils Absolute: 1.9 x10E3/uL (ref 1.4–7.0)
Neutrophils: 50 %
Platelets: 153 x10E3/uL (ref 150–450)
RBC: 3.03 x10E6/uL — ABNORMAL LOW (ref 4.14–5.80)
RDW: 12.1 % (ref 11.6–15.4)
WBC: 3.7 x10E3/uL (ref 3.4–10.8)

## 2024-06-10 LAB — THYROID PANEL WITH TSH
Free Thyroxine Index: 1.6 (ref 1.2–4.9)
T3 Uptake Ratio: 29 % (ref 24–39)
T4, Total: 5.5 ug/dL (ref 4.5–12.0)
TSH: 2 u[IU]/mL (ref 0.450–4.500)

## 2024-06-10 NOTE — Patient Instructions (Signed)
 Fall Prevention in the Home, Adult Falls can cause injuries and can happen to people of all ages. There are many things you can do to make your home safer and to help prevent falls. What actions can I take to prevent falls? General information Use good lighting in all rooms. Make sure to: Replace any light bulbs that burn out. Turn on the lights in dark areas and use night-lights. Keep items that you use often in easy-to-reach places. Lower the shelves around your home if needed. Move furniture so that there are clear paths around it. Do not use throw rugs or other things on the floor that can make you trip. If any of your floors are uneven, fix them. Add color or contrast paint or tape to clearly mark and help you see: Grab bars or handrails. First and last steps of staircases. Where the edge of each step is. If you use a ladder or stepladder: Make sure that it is fully opened. Do not climb a closed ladder. Make sure the sides of the ladder are locked in place. Have someone hold the ladder while you use it. Know where your pets are as you move through your home. What can I do in the bathroom?     Keep the floor dry. Clean up any water on the floor right away. Remove soap buildup in the bathtub or shower. Buildup makes bathtubs and showers slippery. Use non-skid mats or decals on the floor of the bathtub or shower. Attach bath mats securely with double-sided, non-slip rug tape. If you need to sit down in the shower, use a non-slip stool. Install grab bars by the toilet and in the bathtub and shower. Do not use towel bars as grab bars. What can I do in the bedroom? Make sure that you have a light by your bed that is easy to reach. Do not use any sheets or blankets on your bed that hang to the floor. Have a firm chair or bench with side arms that you can use for support when you get dressed. What can I do in the kitchen? Clean up any spills right away. If you need to reach something  above you, use a step stool with a grab bar. Keep electrical cords out of the way. Do not use floor polish or wax that makes floors slippery. What can I do with my stairs? Do not leave anything on the stairs. Make sure that you have a light switch at the top and the bottom of the stairs. Make sure that there are handrails on both sides of the stairs. Fix handrails that are broken or loose. Install non-slip stair treads on all your stairs if they do not have carpet. Avoid having throw rugs at the top or bottom of the stairs. Choose a carpet that does not hide the edge of the steps on the stairs. Make sure that the carpet is firmly attached to the stairs. Fix carpet that is loose or worn. What can I do on the outside of my home? Use bright outdoor lighting. Fix the edges of walkways and driveways and fix any cracks. Clear paths of anything that can make you trip, such as tools or rocks. Add color or contrast paint or tape to clearly mark and help you see anything that might make you trip as you walk through a door, such as a raised step or threshold. Trim any bushes or trees on paths to your home. Check to see if handrails are loose  or broken and that both sides of all steps have handrails. Install guardrails along the edges of any raised decks and porches. Have leaves, snow, or ice cleared regularly. Use sand, salt, or ice melter on paths if you live where there is ice and snow during the winter. Clean up any spills in your garage right away. This includes grease or oil spills. What other actions can I take? Review your medicines with your doctor. Some medicines can cause dizziness or changes in blood pressure, which increase your risk of falling. Wear shoes that: Have a low heel. Do not wear high heels. Have rubber bottoms and are closed at the toe. Feel good on your feet and fit well. Use tools that help you move around if needed. These include: Canes. Walkers. Scooters. Crutches. Ask  your doctor what else you can do to help prevent falls. This may include seeing a physical therapist to learn to do exercises to move better and get stronger. Where to find more information Centers for Disease Control and Prevention, [employer]: TonerPromos.no [employer] on Aging: BaseRingTones.pl National Institute on Aging: BaseRingTones.pl Contact a doctor if: You are afraid of falling at home. You feel weak, drowsy, or dizzy at home. You fall at home. Get help right away if you: Lose consciousness or have trouble moving after a fall. Have a fall that causes a head injury. These symptoms may be an emergency. Get help right away. Call 911. Do not wait to see if the symptoms will go away. Do not drive yourself to the hospital. This information is not intended to replace advice given to you by your health care provider. Make sure you discuss any questions you have with your health care provider. Document Revised: 02/10/2022 Document Reviewed: 02/10/2022 Elsevier Patient Education  2024 [employer].

## 2024-06-10 NOTE — Progress Notes (Signed)
 "  Subjective:    Patient ID: [name] [name], male    DOB: February 16, 1941, 83 y.o.   MRN: [id number]   Chief Complaint: Medications Ordered Prior to Encounter[1]     HPI:  [name] [name] is a 83 y.o. who identifies as a male who was assigned male at birth.   Social history: Lives with: wife Work history: retired from [employer] in today for follow up of the following chronic medical issues:  1. Primary hypertension No c/o chest pain, sob or headache. Doe snot check blood pressure at home. BP Readings from Last 3 Encounters:  04/12/24 (!) 144/55  12/16/23 (!) 140/59  12/11/23 (!) 140/59    2. Nonrheumatic aortic valve stenosis 3. Bilateral carotid artery stenosis 4. PVD (peripheral vascular disease) (HCC) 5. Permanent atrial fibrillation (HCC) 6. Coronary artery disease of native artery of native heart with stable angina pectoris [facility]) Last saw cardiology on 12/09/23. Review of office note showed no change in plan of care. He is to follow up in one year. Has testing on AAA in January  7. Chronic obstructive pulmonary disease, unspecified COPD type (HCC) Says he is having no breathing issues. Is currently on no inhalers. Currently has a cough  8. Primary lung cancer, right (HCC) Was in 2019 and he had surgery and is doing well. No cancer reoccurence. Last saw oncology on 04/12/24. He was cleared and no longer needs followup  9. Gastroesophageal reflux disease without esophagitis Takes protonix  daily and is doing well.  10. Hypothyroidism due to acquired atrophy of thyroid  No issues that he is aware of. Lab Results  Component Value Date   TSH 1.440 12/11/2023     11. Mixed hyperlipidemia Does watch diet and stays very active Lab Results  Component Value Date   CHOL 78 (L) 12/11/2023   HDL 26 (L) 12/11/2023   LDLCALC 35 12/11/2023   TRIG 83 12/11/2023   CHOLHDL 3.0 12/11/2023     12. Benign prostatic hyperplasia without lower urinary tract symptoms Sees  urology. Recently had procedure for bladder cancer. Denies any current voiding issues  13. Vitamin D  deficiency Is on vitamin d  supplement  14. BMI 26.0-26.9,adult No recent weight changes  Wt Readings from Last 3 Encounters:  06/10/24 163 lb (73.9 kg)  04/12/24 166 lb (75.3 kg)  12/16/23 165 lb (74.8 kg)   BMI Readings from Last 3 Encounters:  06/10/24 24.07 kg/m  04/12/24 24.51 kg/m  12/16/23 24.37 kg/m       New complaints: None today  Allergies  Allergen Reactions   Lovenox  [Enoxaparin ]     HIT panel ordered 10/10   Outpatient Encounter Medications as of 06/10/2024  Medication Sig   albuterol  (VENTOLIN  HFA) 108 (90 Base) MCG/ACT inhaler Inhale into the lungs.   alfuzosin  (UROXATRAL ) 10 MG 24 hr tablet TAKE ONE TABLET AT BEDTIME   amLODipine  (NORVASC ) 10 MG tablet TAKE ONE TABLET DAILY   amoxicillin -clavulanate (AUGMENTIN ) 875-125 MG tablet Take 1 tablet by mouth 2 (two) times daily. Take all of this medication   atorvastatin  (LIPITOR) 40 MG tablet TAKE ONE TABLET DAILY   cholecalciferol  (VITAMIN D ) 1000 UNITS tablet Take 1 tablet (1,000 Units total) by mouth daily.   ELIQUIS  5 MG TABS tablet TAKE ONE TABLET TWICE DAILY   ferrous gluconate  (FERGON) 324 MG tablet TAKE ONE TABLET TWICE DAILY WITH MEAL(S)   fluticasone  (FLONASE ) 50 MCG/ACT nasal spray Place 2 sprays into both nostrils daily.   furosemide  (LASIX ) 20 MG tablet  TAKE 1 TABLET DAILY   levothyroxine  (SYNTHROID ) 88 MCG tablet TAKE ONE TABLET DAILY   lisinopril  (ZESTRIL ) 40 MG tablet TAKE ONE TABLET DAILY   metoprolol  succinate (TOPROL -XL) 25 MG 24 hr tablet TAKE 1/2 TABLET DAILY   Multiple Vitamin (MULTIVITAMIN WITH MINERALS) TABS tablet Take 1 tablet by mouth daily. One A Day Multivitamin for Men   Omega-3 Fatty Acids (FISH OIL) 1200 MG CAPS Take 2,400 mg by mouth daily.    pantoprazole  (PROTONIX ) 40 MG tablet TAKE ONE TABLET TWICE DAILY   promethazine -dextromethorphan (PROMETHAZINE -DM) 6.25-15 MG/5ML  syrup Take 5 mLs by mouth 4 (four) times daily as needed for cough.   No facility-administered encounter medications on file as of 06/10/2024.    Past Surgical History:  Procedure Laterality Date   ABDOMINAL AORTIC ANEURYSM REPAIR  08/09/2009   BACK SURGERY     x2   CATARACT EXTRACTION BILATERAL W/ ANTERIOR VITRECTOMY Bilateral 2022   CYSTOSCOPY W/ RETROGRADES Bilateral 09/02/2018   Procedure: CYSTOSCOPY WITH RETROGRADE PYELOGRAM;  Surgeon: Matilda Senior, MD;  Location: [facility];  Service: Urology;  Laterality: Bilateral;   CYSTOSCOPY W/ RETROGRADES Bilateral 03/20/2020   Procedure: CYSTOSCOPY WITH BILATERAL RETROGRADE PYELOGRAM;  Surgeon: Matilda Senior, MD;  Location: [facility];  Service: Urology;  Laterality: Bilateral;   CYSTOSCOPY WITH BIOPSY N/A 09/02/2018   Procedure: CYSTOSCOPY WITH BIOPSY;  Surgeon: Matilda Senior, MD;  Location: [facility];  Service: Urology;  Laterality: N/A;   ESOPHAGOGASTRODUODENOSCOPY N/A 02/21/2017   Procedure: ESOPHAGOGASTRODUODENOSCOPY (EGD);  Surgeon: Albertus Gordy HERO, MD;  Location: [facility] ENDOSCOPY;  Service: Gastroenterology;  Laterality: N/A;   HYDROCELE EXCISION / REPAIR     LOBECTOMY Right 03/30/2017   Procedure: RIGHT UPPER LOBECTOMY LUNG;  Surgeon: Kerrin Elspeth BROCKS, MD;  Location: [facility] OR;  Service: Thoracic;  Laterality: Right;   SKIN CANCER EXCISION     SPINE SURGERY     TEE WITHOUT CARDIOVERSION N/A 02/24/2017   Procedure: TRANSESOPHAGEAL ECHOCARDIOGRAM (TEE);  Surgeon: Wonda Sharper, MD;  Location: [facility] OR;  Service: Open Heart Surgery;  Laterality: N/A;   TRANSCATHETER AORTIC VALVE REPLACEMENT, TRANSFEMORAL N/A 02/24/2017   Procedure: TRANSCATHETER AORTIC VALVE REPLACEMENT, TRANSFEMORAL using a 26mm Edwards Sapien 3 Aortic Valve;  Surgeon: Wonda Sharper, MD;  Location: [facility] OR;  Service: Open Heart Surgery;  Laterality: N/A;   TRANSURETHRAL RESECTION OF BLADDER TUMOR N/A 06/06/2016   Procedure:  TRANSURETHRAL RESECTION OF BLADDER TUMOR (TURBT);  Surgeon: Senior Matilda, MD;  Location: [facility];  Service: Urology;  Laterality: N/A;   TRANSURETHRAL RESECTION OF BLADDER TUMOR N/A 07/22/2016   Procedure: TRANSURETHRAL RESECTION OF BLADDER TUMOR (TURBT);  Surgeon: Senior Matilda, MD;  Location: [facility];  Service: Urology;  Laterality: N/A;   TRANSURETHRAL RESECTION OF BLADDER TUMOR N/A 03/20/2020   Procedure: TRANSURETHRAL RESECTION OF BLADDER TUMOR (TURBT);  Surgeon: Matilda Senior, MD;  Location: [facility];  Service: Urology;  Laterality: N/A;   VIDEO ASSISTED THORACOSCOPY (VATS)/WEDGE RESECTION Right 03/30/2017   Procedure: VIDEO ASSISTED THORACOSCOPY (VATS)/WEDGE RESECTION;  Surgeon: Kerrin Elspeth BROCKS, MD;  Location: [facility] OR;  Service: Thoracic;  Laterality: Right;    Family History  Problem Relation Age of Onset   Heart disease Father        Heart Disease before age 46   Alcohol abuse Father    Heart attack Father    Cancer Mother        Lung   COPD Mother    COPD Sister    Lupus Sister    Hypertension  Brother    Gout Brother    Heart attack Son    Gout Brother    Prostatitis Brother       Controlled substance contract: n/a     Review of Systems  Constitutional:  Negative for diaphoresis.  Eyes:  Negative for pain.  Respiratory:  Negative for shortness of breath.   Cardiovascular:  Negative for chest pain, palpitations and leg swelling.  Gastrointestinal:  Negative for abdominal pain.  Endocrine: Negative for polydipsia.  Skin:  Negative for rash.  Neurological:  Negative for dizziness, weakness and headaches.  Hematological:  Does not bruise/bleed easily.  All other systems reviewed and are negative.      Objective:   Physical Exam Vitals and nursing note reviewed.  Constitutional:      Appearance: Normal appearance. He is well-developed.  HENT:     Head: Normocephalic.     Nose: Nose normal.     Mouth/Throat:     Mouth: Mucous membranes are  moist.     Pharynx: Oropharynx is clear.  Eyes:     Pupils: Pupils are equal, round, and reactive to light.  Neck:     Thyroid : No thyroid  mass or thyromegaly.     Vascular: No carotid bruit or JVD.     Trachea: Phonation normal.  Cardiovascular:     Rate and Rhythm: Normal rate and regular rhythm.  Pulmonary:     Effort: Pulmonary effort is normal. No respiratory distress.     Breath sounds: Normal breath sounds.     Comments: Dry cough Abdominal:     General: Bowel sounds are normal.     Palpations: Abdomen is soft.     Tenderness: There is no abdominal tenderness.  Musculoskeletal:        General: Normal range of motion.     Cervical back: Normal range of motion and neck supple.  Lymphadenopathy:     Cervical: No cervical adenopathy.  Skin:    General: Skin is warm and dry.  Neurological:     Mental Status: He is alert and oriented to person, place, and time.  Psychiatric:        Behavior: Behavior normal.        Thought Content: Thought content normal.        Judgment: Judgment normal.     BP (!) 141/65   Pulse 66   Temp 98 F (36.7 C) (Temporal)   Ht 5' 9 (1.753 m)   Wt 163 lb (73.9 kg)   SpO2 98%   BMI 24.07 kg/m        Assessment & Plan:  BRACH BIRDSALL comes in today with chief complaint of medical management of chronic issues    Diagnosis and orders addressed:  1. Primary hypertension Low sodium diet - CBC with Differential/Platelet - CMP14+EGFR - amLODipine  (NORVASC ) 10 MG tablet; Take 1 tablet (10 mg total) by mouth daily.  Dispense: 90 tablet; Refill: 1  2. Nonrheumatic aortic valve stenosis 3. Bilateral carotid artery stenosis 4. PVD (peripheral vascular disease) (HCC) 5. Permanent atrial fibrillation (HCC) - apixaban  (ELIQUIS ) 5 MG TABS tablet; Take 1 tablet (5 mg total) by mouth 2 (two) times daily.  Dispense: 180 tablet; Refill: 1  6. Coronary artery disease of native artery of native heart with stable angina pectoris (HCC) Yearly  follow up with cardiology - metoprolol  succinate (TOPROL -XL) 25 MG 24 hr tablet; Take 0.5 tablets (12.5 mg total) by mouth daily.  Dispense: 45 tablet; Refill: 1  7. Chronic  obstructive pulmonary disease, unspecified COPD type (HCC)  8. Primary lung cancer, right (HCC)  9. Gastroesophageal reflux disease without esophagitis Avoid spicy foods Do not eat 2 hours prior to bedtime - pantoprazole  (PROTONIX ) 40 MG tablet; TAKE ONE TABLET TWICE DAILY  Dispense: 180 tablet; Refill: 1  10. Hypothyroidism due to acquired atrophy of thyroid  Labs oending - Thyroid  Panel With TSH - levothyroxine  (SYNTHROID ) 88 MCG tablet; Take 1 tablet (88 mcg total) by mouth daily.  Dispense: 90 tablet; Refill: 1  11. Mixed hyperlipidemia Low fat diet - Lipid panel - atorvastatin  (LIPITOR) 40 MG tablet; Take 1 tablet (40 mg total) by mouth daily.  Dispense: 90 tablet; Refill: 1 - lisinopril  (ZESTRIL ) 40 MG tablet; Take 1 tablet (40 mg total) by mouth daily.  Dispense: 90 tablet; Refill: 1  12. Benign prostatic hyperplasia without lower urinary tract symptoms Report any voiding issues - alfuzosin  (UROXATRAL ) 10 MG 24 hr tablet; Take 1 tablet (10 mg total) by mouth at bedtime.  Dispense: 90 tablet; Refill: 1  13. Vitamin D  deficiency Continue vitamin d  supplement  14. BMI 26.0-26.9,adult Discussed diet and exercise for person with BMI >25 Will recheck weight in 3-6 months   Labs pending Health Maintenance reviewed Diet and exercise encouraged  Follow up plan: 6 months   [name] Gladis, FNP      [1]  Current Outpatient Medications on File Prior to Visit  Medication Sig Dispense Refill   albuterol  (VENTOLIN  HFA) 108 (90 Base) MCG/ACT inhaler Inhale into the lungs.     alfuzosin  (UROXATRAL ) 10 MG 24 hr tablet TAKE ONE TABLET AT BEDTIME 90 tablet 1   amLODipine  (NORVASC ) 10 MG tablet TAKE ONE TABLET DAILY 90 tablet 1   amoxicillin -clavulanate (AUGMENTIN ) 875-125 MG tablet Take 1 tablet by  mouth 2 (two) times daily. Take all of this medication 20 tablet 0   atorvastatin  (LIPITOR) 40 MG tablet TAKE ONE TABLET DAILY 90 tablet 1   cholecalciferol  (VITAMIN D ) 1000 UNITS tablet Take 1 tablet (1,000 Units total) by mouth daily. 30 tablet 6   ELIQUIS  5 MG TABS tablet TAKE ONE TABLET TWICE DAILY 180 tablet 0   ferrous gluconate  (FERGON) 324 MG tablet TAKE ONE TABLET TWICE DAILY WITH MEAL(S) 180 tablet 1   fluticasone  (FLONASE ) 50 MCG/ACT nasal spray Place 2 sprays into both nostrils daily. 16 g 6   furosemide  (LASIX ) 20 MG tablet TAKE 1 TABLET DAILY 30 tablet 4   levothyroxine  (SYNTHROID ) 88 MCG tablet TAKE ONE TABLET DAILY 90 tablet 1   lisinopril  (ZESTRIL ) 40 MG tablet TAKE ONE TABLET DAILY 90 tablet 1   metoprolol  succinate (TOPROL -XL) 25 MG 24 hr tablet TAKE 1/2 TABLET DAILY 45 tablet 1   Multiple Vitamin (MULTIVITAMIN WITH MINERALS) TABS tablet Take 1 tablet by mouth daily. One A Day Multivitamin for Men     Omega-3 Fatty Acids (FISH OIL) 1200 MG CAPS Take 2,400 mg by mouth daily.      pantoprazole  (PROTONIX ) 40 MG tablet TAKE ONE TABLET TWICE DAILY 180 tablet 1   promethazine -dextromethorphan (PROMETHAZINE -DM) 6.25-15 MG/5ML syrup Take 5 mLs by mouth 4 (four) times daily as needed for cough. 240 mL 0   No current facility-administered medications on file prior to visit.   "

## 2024-06-10 NOTE — Addendum Note (Signed)
 Addended by: GLADIS MUSTARD on: 06/10/2024 09:31 AM   Modules accepted: Orders

## 2024-06-13 ENCOUNTER — Ambulatory Visit: Payer: Self-pay | Admitting: Nurse Practitioner

## 2024-06-20 ENCOUNTER — Encounter (HOSPITAL_COMMUNITY): Payer: Self-pay

## 2024-06-20 ENCOUNTER — Emergency Department (HOSPITAL_COMMUNITY)

## 2024-06-20 ENCOUNTER — Other Ambulatory Visit: Payer: Self-pay

## 2024-06-20 ENCOUNTER — Emergency Department (HOSPITAL_COMMUNITY)
Admission: EM | Admit: 2024-06-20 | Discharge: 2024-06-20 | Disposition: A | Discharge status: Home / Self Care | Attending: Emergency Medicine | Admitting: Emergency Medicine

## 2024-06-20 ENCOUNTER — Other Ambulatory Visit: Payer: Self-pay | Admitting: Nurse Practitioner

## 2024-06-20 DIAGNOSIS — Z79899 Other long term (current) drug therapy: Secondary | ICD-10-CM | POA: Insufficient documentation

## 2024-06-20 DIAGNOSIS — J449 Chronic obstructive pulmonary disease, unspecified: Secondary | ICD-10-CM | POA: Diagnosis not present

## 2024-06-20 DIAGNOSIS — I2510 Atherosclerotic heart disease of native coronary artery without angina pectoris: Secondary | ICD-10-CM | POA: Insufficient documentation

## 2024-06-20 DIAGNOSIS — I10 Essential (primary) hypertension: Secondary | ICD-10-CM | POA: Diagnosis not present

## 2024-06-20 DIAGNOSIS — R051 Acute cough: Secondary | ICD-10-CM | POA: Insufficient documentation

## 2024-06-20 DIAGNOSIS — Z85118 Personal history of other malignant neoplasm of bronchus and lung: Secondary | ICD-10-CM | POA: Insufficient documentation

## 2024-06-20 DIAGNOSIS — I4821 Permanent atrial fibrillation: Secondary | ICD-10-CM

## 2024-06-20 DIAGNOSIS — Z7901 Long term (current) use of anticoagulants: Secondary | ICD-10-CM | POA: Diagnosis not present

## 2024-06-20 DIAGNOSIS — R059 Cough, unspecified: Secondary | ICD-10-CM | POA: Diagnosis present

## 2024-06-20 DIAGNOSIS — Z7951 Long term (current) use of inhaled steroids: Secondary | ICD-10-CM | POA: Insufficient documentation

## 2024-06-20 LAB — CBC WITH DIFFERENTIAL/PLATELET
Abs Immature Granulocytes: 0.01 K/uL (ref 0.00–0.07)
Basophils Absolute: 0 K/uL (ref 0.0–0.1)
Basophils Relative: 0 %
Eosinophils Absolute: 0.2 K/uL (ref 0.0–0.5)
Eosinophils Relative: 3 %
HCT: 32 % — ABNORMAL LOW (ref 39.0–52.0)
Hemoglobin: 10.2 g/dL — ABNORMAL LOW (ref 13.0–17.0)
Immature Granulocytes: 0 %
Lymphocytes Relative: 18 %
Lymphs Abs: 0.9 K/uL (ref 0.7–4.0)
MCH: 32.5 pg (ref 26.0–34.0)
MCHC: 31.9 g/dL (ref 30.0–36.0)
MCV: 101.9 fL — ABNORMAL HIGH (ref 80.0–100.0)
Monocytes Absolute: 0.6 K/uL (ref 0.1–1.0)
Monocytes Relative: 11 %
Neutro Abs: 3.5 K/uL (ref 1.7–7.7)
Neutrophils Relative %: 68 %
Platelets: 112 K/uL — ABNORMAL LOW (ref 150–400)
RBC: 3.14 MIL/uL — ABNORMAL LOW (ref 4.22–5.81)
RDW: 13.4 % (ref 11.5–15.5)
WBC: 5.2 K/uL (ref 4.0–10.5)
nRBC: 0 % (ref 0.0–0.2)

## 2024-06-20 LAB — COMPREHENSIVE METABOLIC PANEL WITH GFR
ALT: 10 U/L (ref 0–44)
AST: 28 U/L (ref 15–41)
Albumin: 4 g/dL (ref 3.5–5.0)
Alkaline Phosphatase: 77 U/L (ref 38–126)
Anion gap: 8 (ref 5–15)
BUN: 12 mg/dL (ref 8–23)
CO2: 29 mmol/L (ref 22–32)
Calcium: 8.8 mg/dL — ABNORMAL LOW (ref 8.9–10.3)
Chloride: 103 mmol/L (ref 98–111)
Creatinine, Ser: 1.08 mg/dL (ref 0.61–1.24)
GFR, Estimated: >60 mL/min
Glucose, Bld: 98 mg/dL (ref 70–99)
Potassium: 4 mmol/L (ref 3.5–5.1)
Sodium: 140 mmol/L (ref 135–145)
Total Bilirubin: 0.6 mg/dL (ref 0.0–1.2)
Total Protein: 7.4 g/dL (ref 6.5–8.1)

## 2024-06-20 LAB — MAGNESIUM: Magnesium: 2.1 mg/dL (ref 1.7–2.4)

## 2024-06-20 MED ORDER — PREDNISONE 10 MG PO TABS: 40.0000 mg | ORAL_TABLET | Freq: Every day | ORAL | 0 refills | Status: AC

## 2024-06-20 MED ORDER — AMOXICILLIN-POT CLAVULANATE 875-125 MG PO TABS
1.0000 | ORAL_TABLET | Freq: Once | ORAL | Status: AC
  Administered 2024-06-20: 1 via ORAL
  Filled 2024-06-20: qty 1

## 2024-06-20 MED ORDER — AZITHROMYCIN 250 MG PO TABS
500.0000 mg | ORAL_TABLET | Freq: Once | ORAL | Status: AC
  Administered 2024-06-20: 500 mg via ORAL
  Filled 2024-06-20: qty 2

## 2024-06-20 MED ORDER — IPRATROPIUM-ALBUTEROL 0.5-2.5 (3) MG/3ML IN SOLN
3.0000 mL | Freq: Once | RESPIRATORY_TRACT | Status: AC
  Administered 2024-06-20: 3 mL via RESPIRATORY_TRACT
  Filled 2024-06-20: qty 3

## 2024-06-20 MED ORDER — PREDNISONE 50 MG PO TABS
60.0000 mg | ORAL_TABLET | Freq: Every day | ORAL | Status: DC
  Administered 2024-06-20: 60 mg via ORAL
  Filled 2024-06-20: qty 1

## 2024-06-20 MED ORDER — AZITHROMYCIN 250 MG PO TABS: 250.0000 mg | ORAL_TABLET | Freq: Every day | ORAL | 0 refills | Status: AC

## 2024-06-20 NOTE — Discharge Instructions (Addendum)
 Your test results today were reassuring.  Prescriptions for steroids and antibiotics were sent to your pharmacy.  Take as prescribed for the next 4 days, starting tomorrow.  You did receive your first doses while in the emergency department.  Return for any new or worsening symptoms of concern.

## 2024-06-20 NOTE — ED Provider Notes (Signed)
 " [name] EMERGENCY DEPARTMENT AT [facility] Provider Note   CSN: [id number] Arrival date & time: 06/20/24  9341     Patient presents with: Cough   [name] [name] is a 83 y.o. male.   HPI Patient presents for cough.  Medical history includes GERD, arthritis, atrial fibrillation, AAA s/p repair, aortic stenosis s/p TAVR, COPD, HTN, HLD, lung cancer, CAD.  For the past 2 days, patient has had productive cough.  He describes sputum as yellow-green in color.  He does not typically use breathing treatments at home.  He does have an as needed use inhaler.    Prior to Admission medications  Medication Sig Start Date End Date Taking? Authorizing Provider  azithromycin  (ZITHROMAX ) 250 MG tablet Take 1 tablet (250 mg total) by mouth daily for 4 days. Take 1 every day until finished, starting tomorrow. 06/20/24 06/24/24 Yes Melvenia Motto, MD  predniSONE  (DELTASONE ) 10 MG tablet Take 4 tablets (40 mg total) by mouth daily for 4 days. Starting tomorrow 06/20/24 06/24/24 Yes Melvenia Motto, MD  albuterol  (VENTOLIN  HFA) 108 [phone] Base) MCG/ACT inhaler Inhale into the lungs. 10/11/22 06/10/24  [provider]  alfuzosin  (UROXATRAL ) 10 MG 24 hr tablet TAKE ONE TABLET AT BEDTIME 03/07/24   Gladis Mustard, FNP  amLODipine  (NORVASC ) 10 MG tablet TAKE ONE TABLET DAILY 02/15/24   Gladis Mustard, FNP  atorvastatin  (LIPITOR) 40 MG tablet TAKE ONE TABLET DAILY 03/07/24   Gladis, [name], FNP  cholecalciferol  (VITAMIN D ) 1000 UNITS tablet Take 1 tablet (1,000 Units total) by mouth daily. 12/11/14   Gladis Mustard, FNP  ELIQUIS  5 MG TABS tablet TAKE ONE TABLET TWICE DAILY 03/28/24   Gladis Mustard, FNP  ferrous gluconate  (FERGON) 324 MG tablet TAKE ONE TABLET TWICE DAILY WITH MEAL(S) 03/07/24   Gladis [name], FNP  fluticasone  (FLONASE ) 50 MCG/ACT nasal spray Place 2 sprays into both nostrils daily. 12/11/23   Gladis Mustard, FNP  furosemide  (LASIX ) 20 MG  tablet TAKE 1 TABLET DAILY 02/24/24   Gladis Mustard, FNP  levothyroxine  (SYNTHROID ) 88 MCG tablet TAKE ONE TABLET DAILY 03/07/24   Gladis Mustard, FNP  lisinopril  (ZESTRIL ) 40 MG tablet TAKE ONE TABLET DAILY 03/07/24   Gladis Mustard, FNP  metoprolol  succinate (TOPROL -XL) 25 MG 24 hr tablet TAKE 1/2 TABLET DAILY 03/07/24   Gladis Mustard, FNP  Multiple Vitamin (MULTIVITAMIN WITH MINERALS) TABS tablet Take 1 tablet by mouth daily. One A Day Multivitamin for Men    [provider]  Omega-3 Fatty Acids (FISH OIL) 1200 MG CAPS Take 2,400 mg by mouth daily.     [provider]  pantoprazole  (PROTONIX ) 40 MG tablet TAKE ONE TABLET TWICE DAILY 03/07/24   Gladis Mustard, FNP    Allergies: Lovenox  [enoxaparin ]    Review of Systems  HENT:  Positive for congestion.   Respiratory:  Positive for cough.   All other systems reviewed and are negative.   Updated Vital Signs BP (!) 113/92   Pulse 60   Temp 98.3 F (36.8 C) (Oral)   Resp 16   Ht 5' 9 (1.753 m)   Wt 72.6 kg   SpO2 96%   BMI 23.63 kg/m   Physical Exam Vitals and nursing note reviewed.  Constitutional:      General: He is not in acute distress.    Appearance: Normal appearance. He is well-developed. He is not ill-appearing, toxic-appearing or diaphoretic.  HENT:     Head: Normocephalic and atraumatic.     Right Ear: External ear normal.  Left Ear: External ear normal.     Nose: Nose normal.     Mouth/Throat:     Mouth: Mucous membranes are moist.  Eyes:     Extraocular Movements: Extraocular movements intact.     Conjunctiva/sclera: Conjunctivae normal.  Cardiovascular:     Rate and Rhythm: Normal rate and regular rhythm.  Pulmonary:     Effort: Pulmonary effort is normal. No respiratory distress.     Breath sounds: Decreased breath sounds present. No wheezing, rhonchi or rales.  Chest:     Chest wall: No tenderness.  Abdominal:     General: There is no distension.      Palpations: Abdomen is soft.  Musculoskeletal:        General: No swelling.     Cervical back: Normal range of motion and neck supple.  Skin:    General: Skin is warm and dry.  Neurological:     General: No focal deficit present.     Mental Status: He is alert and oriented to person, place, and time.  Psychiatric:        Mood and Affect: Mood normal.        Behavior: Behavior normal.     (all labs ordered are listed, but only abnormal results are displayed) Labs Reviewed  COMPREHENSIVE METABOLIC PANEL WITH GFR - Abnormal; Notable for the following components:      Result Value   Calcium  8.8 (*)    All other components within normal limits  CBC WITH DIFFERENTIAL/PLATELET - Abnormal; Notable for the following components:   RBC 3.14 (*)    Hemoglobin 10.2 (*)    HCT 32.0 (*)    MCV 101.9 (*)    Platelets 112 (*)    All other components within normal limits  MAGNESIUM     EKG: None  Radiology: DG Chest Port 1 View Result Date: 06/20/2024 EXAM: 1 VIEW XRAY OF THE CHEST 06/20/2024 07:40:00 AM COMPARISON: PA and lateral chest from 05/19/2017, chest CT from 04/05/2024. CLINICAL HISTORY: cough cough FINDINGS: LUNGS AND PLEURA: The lungs are emphysematous with scarring changes, and postsurgical changes on the right. There has been prior right upper lobe lobectomy. No focal infiltrate is seen. No significant pleural effusion. No pneumothorax. HEART AND MEDIASTINUM: TAVR. There is a stable mediastinum with aortic tortuosity and atherosclerosis. The cardiac size is normal. BONES AND SOFT TISSUES: No new osseous findings. IMPRESSION: 1. No acute cardiopulmonary findings. No focal infiltrate or pleural effusion. 2. Emphysematous lungs with scarring changes and postsurgical changes on the right, including prior right upper lobe lobectomy. 3. Tavr with aortic atherosclerosis and uncoiling. Electronically signed by: Francis Quam MD 06/20/2024 07:52 AM EST RP Workstation: [id number]      Procedures   Medications Ordered in the ED  predniSONE  (DELTASONE ) tablet 60 mg (60 mg Oral Given 06/20/24 0838)  ipratropium-albuterol  (DUONEB) 0.5-2.5 (3) MG/3ML nebulizer solution 3 mL (3 mLs Nebulization Given 06/20/24 0820)  azithromycin  (ZITHROMAX ) tablet 500 mg (500 mg Oral Given 06/20/24 0839)  amoxicillin -clavulanate (AUGMENTIN ) 875-125 MG per tablet 1 tablet (1 tablet Oral Given 06/20/24 9161)                                    Medical Decision Making Amount and/or Complexity of Data Reviewed Labs: ordered. Radiology: ordered.  Risk Prescription drug management.   This patient presents to the ED for concern of cough, this involves an extensive number of treatment options,  and is a complaint that carries with it a high risk of complications and morbidity.  The differential diagnosis includes URI, environmental allergen, bronchitis, pneumonia, COPD exacerbation   Co morbidities / Chronic conditions that complicate the patient evaluation  GERD, arthritis, atrial fibrillation, AAA s/p repair, aortic stenosis s/p TAVR, COPD, HTN, HLD, lung cancer, CAD   Additional history obtained:  Additional history obtained from EMR External records from outside source obtained and reviewed including N/A   Lab Tests:  I Ordered, and personally interpreted labs.  The pertinent results include: Slight anemia, no leukocytosis, normal kidney function, normal electrolytes   Imaging Studies ordered:  I ordered imaging studies including chest x-ray I independently visualized and interpreted imaging which showed no acute findings, chronic emphysematous change I agree with the radiologist interpretation   Cardiac Monitoring: / EKG:  The patient was maintained on a cardiac monitor.  I personally viewed and interpreted the cardiac monitored which showed an underlying rhythm of: Atrial fibrillation   Problem List / ED Course / Critical interventions / Medication  management  Patient presenting for 2 days of cough that has been productive of green-yellow sputum.  On arrival in the ED, vital signs notable for moderate hypertension.  Patient is well-appearing on exam.  His current breathing is unlabored.  He is able to speak in complete sentences.  On lung auscultation, there is mildly diminished breath sounds.  Given his history of COPD, prednisone  and DuoNeb were ordered.  Given his increased sputum production, will initiate antibiotics.  X-ray does not show any acute findings.  On reassessment, patient resting comfortably.  Breathing remains unlabored and SpO2 remains normal on room air.  Lab work was unremarkable.  Patient was prescribed ongoing azithromycin  and prednisone .  He is stable for discharge. I ordered medication including prednisone , DuoNeb, azithromycin , Augmentin  for productive cough with history of COPD Reevaluation of the patient after these medicines showed that the patient improved I have reviewed the patients home medicines and have made adjustments as needed  Social Determinants of Health:  Lives independently     Final diagnoses:  Acute cough    ED Discharge Orders          Ordered    azithromycin  (ZITHROMAX ) 250 MG tablet  Daily        06/20/24 0903    predniSONE  (DELTASONE ) 10 MG tablet  Daily        06/20/24 0903               Melvenia Motto, MD 06/20/24 [phone]  "

## 2024-06-20 NOTE — ED Triage Notes (Signed)
 Patient come in POV for complaint of Cough that started Saturday. Denies fever.

## 2024-06-20 NOTE — ED Notes (Signed)
 ED Provider at bedside.

## 2024-06-28 ENCOUNTER — Encounter: Payer: Self-pay | Admitting: Nurse Practitioner

## 2024-06-28 ENCOUNTER — Ambulatory Visit: Admitting: Nurse Practitioner

## 2024-06-28 VITALS — BP 151/78 | HR 72 | Temp 97.2°F | Ht 69.0 in | Wt 162.0 lb

## 2024-06-28 DIAGNOSIS — J410 Simple chronic bronchitis: Secondary | ICD-10-CM | POA: Diagnosis not present

## 2024-06-28 DIAGNOSIS — Z09 Encounter for follow-up examination after completed treatment for conditions other than malignant neoplasm: Secondary | ICD-10-CM | POA: Diagnosis not present

## 2024-06-28 MED ORDER — IPRATROPIUM-ALBUTEROL 0.5-2.5 (3) MG/3ML IN SOLN: 3.0000 mL | Freq: Four times a day (QID) | RESPIRATORY_TRACT | 0 refills | Status: AC | PRN

## 2024-06-28 NOTE — Progress Notes (Signed)
 "  Subjective:    Patient ID: [name] [name], male    DOB: 10/09/1940, 84 y.o.   MRN: [id number]   Chief Complaint: Hospitalization Follow-up   HPI  Patient went to the ED with a cough. Was cough is much better but not completely resolved.  treated for bronchitis. Was given azithromycin  and prednisone . They gave him a breathing treatment in ED which helped. He has a history of COPD and lung cancer. Only inhaler he has is albuterol  to use as needed. Use albuterol  2-3 x a week. Would like to have nebulizer machine at home.  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   Gastroesophageal reflux disease without esophagitis 05/25/2019   Primary lung cancer, right (HCC) 05/24/2018   S/P lobectomy of lung 03/30/2017   Melena    Atrial fibrillation (HCC) 02/20/2017   BPH (benign prostatic hyperplasia) 02/20/2017   COPD (chronic obstructive pulmonary disease) (HCC) 12/09/2016   Aortic stenosis 12/11/2014   Vitamin D  deficiency    BMI 26.0-26.9,adult 05/18/2013   Fatty liver 02/24/2013   Gallbladder polyp 02/24/2013   HLD (hyperlipidemia) 01/14/2013   HTN (hypertension) 01/14/2013   Hypothyroidism 01/14/2013   S/P AAA repair 05/31/2012   Carotid stenosis 05/31/2012       Review of Systems  Constitutional:  Negative for diaphoresis.  Eyes:  Negative for pain.  Respiratory:  Negative for shortness of breath.   Cardiovascular:  Negative for chest pain, palpitations and leg swelling.  Gastrointestinal:  Negative for abdominal pain.  Endocrine: Negative for polydipsia.  Skin:  Negative for rash.  Neurological:  Negative for dizziness, weakness and headaches.  Hematological:  Does not bruise/bleed easily.  All other systems reviewed and are negative.      Objective:    Physical Exam Constitutional:      Appearance: Normal appearance.  Cardiovascular:     Rate and Rhythm: Normal rate and regular rhythm.     Heart sounds: Normal heart sounds.  Pulmonary:     Effort: Pulmonary effort is normal.     Breath sounds: Normal breath sounds.  Neurological:     General: No focal deficit present.     Mental Status: He is alert and oriented to person, place, and time.  Psychiatric:        Mood and Affect: Mood normal.        Behavior: Behavior normal.     BP (!) 151/78   Pulse 72   Temp (!) 97.2 F (36.2 C) (Temporal)   Ht 5' 9 (1.753 m)   Wt 162 lb (73.5 kg)   SpO2 98%   BMI 23.92 kg/m        Assessment & Plan:   [name] [name] in today with chief complaint of Hospitalization Follow-up   1. Simple chronic bronchitis (HCC) (Primary) Albuterol  as needed Force fluids Run humidifier in home - ipratropium-albuterol  (DUONEB) 0.5-2.5 (3) MG/3ML SOLN; Take 3 mLs by nebulization every 6 (six) hours as needed.  Dispense: 360 mL; Refill: 0 - For home use only DME Nebulizer machine  2. Hospital discharge follow-up Hospital records reviewed    The above assessment and management plan was discussed with the patient. The patient verbalized understanding of and has agreed to the management plan. Patient is aware to call the clinic if symptoms persist  or worsen. Patient is aware when to return to the clinic for a follow-up visit. Patient educated on when it is appropriate to go to the emergency department.   [name] Gladis, FNP   "

## 2024-06-28 NOTE — Patient Instructions (Signed)
 How to Use a Nebulizer, Adult  A nebulizer is a device that turns liquid medicine into a mist or vapor that you can breathe in (inhale). This medicine helps to open the air passages in your lungs. You may need to use a nebulizer if you have an acute breathing illness, such as pneumonia. A nebulizer may also be used to treat chronic conditions, such as asthma or chronic obstructive pulmonarydisease (COPD). There are different kinds of nebulizers. With some nebulizers, you breathe in medicine through a mouthpiece. With others, you get medicine through a mask that fits over your nose and mouth. What are the risks? If you use a nebulizer that does not fit right or is not cleaned properly, it can cause some problems, including: Infection. Eye irritation. Delivery of too much medicine or not enough medicine. Mouth irritation. Supplies needed: Air compressor (nebulizer machine). Nebulizer medicine cup (reservoir)and tubing. Mouthpiece or face mask. Soap and water. Sterile or distilled water. Clean towel. How to use a nebulizer     Preparing a nebulizer Take these steps before using your nebulizer: Read the manufacturer's instructions for your nebulizer, as machines vary. Check your medicine. Make sure it has not expired and is not damaged in any way. Wash your hands with soap and water. Put all of the parts of your nebulizer on a sturdy, flat surface. Connect the tubing to the nebulizer machine and to the reservoir. Measure the liquid medicine according to instructions from your health care provider. Pour the liquid into the reservoir. Attach the mouthpiece or mask. Test the nebulizer by turning it on to make sure that a spray comes out. Then, turn it off. Using a nebulizer Be sure to stop the machine at any time if you start coughing or if the medicine foams or bubbles. Sit in an upright, relaxed position. If your nebulizer has a mask, put it over your nose and mouth. It should fit  somewhat snugly, with no gaps around the nose or cheeks where medicine could escape. If you use a mouthpiece, put it in your mouth. Press your lips firmly around the mouthpiece. Turn on the nebulizer. Some nebulizers have a finger valve. If yours does, cover up the air hole so the air gets to the nebulizer. Once the medicine begins to mist out, take slow, deep breaths. If there is a finger valve, release it at the end of your breath. Continue taking slow, deep breaths until the medicine in the nebulizer is gone and no mist appears. Cleaning a nebulizer The nebulizer and all of its parts must be kept very clean. If the nebulizer and its parts are not cleaned properly, bacteria can grow inside of them. If you inhale the bacteria, you can get sick. Follow the manufacturer's instructions for cleaning your nebulizer. For most nebulizers, you should follow these guidelines: Clean the mouthpiece or mask and the reservoir by: Rinsing them after each use. Use sterile or distilled water. Washing them 1-2 times a week using soap and warm water. Do not wash the tubing. After you rinse or wash them, place the parts on a clean towel and let them air-dry completely. After they dry, reconnect the pieces and turn the nebulizer on without any medicine in it. Doing this will blow air through the equipment to help dry it out. Store the nebulizer in a clean and dust-free place. Check the filter at least one time every week. Replace the filter if it looks dirty. Follow these instructions at home Use your nebulizer  only as told by your health care provider. Do not use the nebulizer more than directed by your health care provider. Do not use any products that contain nicotine or tobacco, such as cigarettes, e-cigarettes, and chewing tobacco. If you need help quitting, ask your health care provider. Keep all follow-up visits as told by your health care provider. This is important. Where to find more information [employer]: allergyasthmanetwork.org [employer] Association: www.lung.org Contact a health care provider if: You have trouble using the nebulizer. Your nebulizer foams or stops working. Your nebulizer does not create a mist after you add medicine and turn it on. Get help right away if: You continue to have trouble breathing. Your breathing gets worse during a nebulizer treatment. These symptoms may represent a serious problem that is an emergency. Do not wait to see if the symptoms will go away. Get medical help right away. Call your local emergency services (911 in the [place] Do not drive yourself to the hospital. Summary A nebulizer is a device that turns liquid medicine into a mist (vapor) that you can breathe in (inhale). Measure the liquid medicine according to instructions from your health care provider. Pour the liquid into the part of the nebulizer that holds the medicine (reservoir). Once the medicine begins to mist out, take slow, deep breaths. Rinse or wash the mouthpiece or mask and the reservoir after each use, and allow them to air-dry completely. This information is not intended to replace advice given to you by your health care provider. Make sure you discuss any questions you have with your health care provider. Document Revised: 05/12/2022 Document Reviewed: 05/12/2022 Elsevier Patient Education  2024 [employer].

## 2024-07-04 ENCOUNTER — Ambulatory Visit

## 2024-07-04 ENCOUNTER — Ambulatory Visit (HOSPITAL_COMMUNITY)
Admission: RE | Admit: 2024-07-04 | Discharge: 2024-07-04 | Disposition: A | Discharge status: Home / Self Care | Source: Ambulatory Visit | Attending: Surgery | Admitting: Surgery

## 2024-07-04 DIAGNOSIS — Z95828 Presence of other vascular implants and grafts: Secondary | ICD-10-CM | POA: Diagnosis not present

## 2024-07-04 DIAGNOSIS — Z8679 Personal history of other diseases of the circulatory system: Secondary | ICD-10-CM | POA: Diagnosis not present

## 2024-07-25 ENCOUNTER — Ambulatory Visit

## 2024-07-25 VITALS — BP 149/77 | HR 78 | Ht 69.0 in | Wt 164.0 lb

## 2024-07-25 DIAGNOSIS — Z8679 Personal history of other diseases of the circulatory system: Secondary | ICD-10-CM

## 2024-07-25 DIAGNOSIS — I714 Abdominal aortic aneurysm, without rupture, unspecified: Secondary | ICD-10-CM | POA: Diagnosis not present

## 2024-07-25 DIAGNOSIS — I6529 Occlusion and stenosis of unspecified carotid artery: Secondary | ICD-10-CM

## 2024-07-25 DIAGNOSIS — Z95828 Presence of other vascular implants and grafts: Secondary | ICD-10-CM | POA: Diagnosis not present

## 2024-07-29 ENCOUNTER — Other Ambulatory Visit: Payer: Self-pay | Admitting: Nurse Practitioner

## 2024-08-30 ENCOUNTER — Other Ambulatory Visit: Admitting: Urology

## 2024-12-09 ENCOUNTER — Ambulatory Visit: Admitting: Nurse Practitioner

## 2024-12-16 ENCOUNTER — Ambulatory Visit: Payer: Self-pay
